# Patient Record
Sex: Female | Born: 1935
Health system: Southern US, Community
[De-identification: ages and names within clinical notes are randomized; demographics above are authoritative.]

## PROBLEM LIST (undated history)

## (undated) DIAGNOSIS — E785 Hyperlipidemia, unspecified: Secondary | ICD-10-CM

## (undated) DIAGNOSIS — M810 Age-related osteoporosis without current pathological fracture: Secondary | ICD-10-CM

## (undated) DIAGNOSIS — S42202A Unspecified fracture of upper end of left humerus, initial encounter for closed fracture: Secondary | ICD-10-CM

## (undated) DIAGNOSIS — M79609 Pain in unspecified limb: Secondary | ICD-10-CM

## (undated) DIAGNOSIS — R32 Unspecified urinary incontinence: Secondary | ICD-10-CM

## (undated) DIAGNOSIS — R209 Unspecified disturbances of skin sensation: Secondary | ICD-10-CM

## (undated) DIAGNOSIS — L2089 Other atopic dermatitis: Secondary | ICD-10-CM

## (undated) DIAGNOSIS — R55 Syncope and collapse: Secondary | ICD-10-CM

## (undated) DIAGNOSIS — H8090 Unspecified otosclerosis, unspecified ear: Secondary | ICD-10-CM

## (undated) DIAGNOSIS — G5600 Carpal tunnel syndrome, unspecified upper limb: Secondary | ICD-10-CM

## (undated) DIAGNOSIS — H905 Unspecified sensorineural hearing loss: Secondary | ICD-10-CM

## (undated) DIAGNOSIS — E039 Hypothyroidism, unspecified: Secondary | ICD-10-CM

## (undated) DIAGNOSIS — M1990 Unspecified osteoarthritis, unspecified site: Secondary | ICD-10-CM

## (undated) DIAGNOSIS — J339 Nasal polyp, unspecified: Secondary | ICD-10-CM

## (undated) DIAGNOSIS — R7989 Other specified abnormal findings of blood chemistry: Secondary | ICD-10-CM

## (undated) DIAGNOSIS — N318 Other neuromuscular dysfunction of bladder: Secondary | ICD-10-CM

## (undated) DIAGNOSIS — K5730 Diverticulosis of large intestine without perforation or abscess without bleeding: Secondary | ICD-10-CM

## (undated) HISTORY — DX: Syncope and collapse: R55

## (undated) HISTORY — DX: Diverticulosis of large intestine without perforation or abscess without bleeding: K57.30

## (undated) HISTORY — DX: Carpal tunnel syndrome, unspecified upper limb: G56.00

## (undated) HISTORY — DX: Unspecified otosclerosis, unspecified ear: H80.90

## (undated) HISTORY — DX: Unspecified urinary incontinence: R32

## (undated) HISTORY — DX: Unspecified sensorineural hearing loss: H90.5

## (undated) HISTORY — DX: Unspecified disturbances of skin sensation: R20.9

## (undated) HISTORY — PX: CATARACT EXTRACTION W/ INTRAOCULAR LENS  IMPLANT, BILATERAL: SHX1307

## (undated) HISTORY — DX: Age-related osteoporosis without current pathological fracture: M81.0

## (undated) HISTORY — DX: Other neuromuscular dysfunction of bladder: N31.8

## (undated) HISTORY — DX: Unspecified osteoarthritis, unspecified site: M19.90

## (undated) HISTORY — DX: Pain in unspecified limb: M79.609

## (undated) HISTORY — DX: Unspecified fracture of upper end of left humerus, initial encounter for closed fracture: S42.202A

## (undated) HISTORY — DX: Other specified abnormal findings of blood chemistry: R79.89

## (undated) HISTORY — DX: Hyperlipidemia, unspecified: E78.5

## (undated) HISTORY — DX: Nasal polyp, unspecified: J33.9

## (undated) HISTORY — DX: Other atopic dermatitis: L20.89

---

## 1990-07-16 HISTORY — PX: BREAST BIOPSY: SHX20

## 1997-02-04 HISTORY — PX: ABDOMINAL HYSTERECTOMY: SHX81

## 1997-10-24 ENCOUNTER — Ambulatory Visit (HOSPITAL_COMMUNITY): Admission: RE | Admit: 1997-10-24 | Discharge: 1997-10-24 | Payer: Self-pay | Admitting: Family Medicine

## 1998-10-26 ENCOUNTER — Ambulatory Visit (HOSPITAL_COMMUNITY): Admission: RE | Admit: 1998-10-26 | Discharge: 1998-10-26 | Payer: Self-pay | Admitting: Family Medicine

## 1998-10-26 ENCOUNTER — Encounter: Payer: Self-pay | Admitting: Family Medicine

## 1999-10-30 ENCOUNTER — Ambulatory Visit (HOSPITAL_COMMUNITY): Admission: RE | Admit: 1999-10-30 | Discharge: 1999-10-30 | Payer: Self-pay | Admitting: Family Medicine

## 1999-10-30 ENCOUNTER — Encounter: Payer: Self-pay | Admitting: Family Medicine

## 1999-12-31 ENCOUNTER — Ambulatory Visit (HOSPITAL_COMMUNITY): Admission: RE | Admit: 1999-12-31 | Discharge: 1999-12-31 | Payer: Self-pay | Admitting: Family Medicine

## 1999-12-31 ENCOUNTER — Encounter: Payer: Self-pay | Admitting: Family Medicine

## 2000-10-30 ENCOUNTER — Encounter: Payer: Self-pay | Admitting: Family Medicine

## 2000-10-30 ENCOUNTER — Ambulatory Visit (HOSPITAL_COMMUNITY): Admission: RE | Admit: 2000-10-30 | Discharge: 2000-10-30 | Payer: Self-pay | Admitting: Family Medicine

## 2000-11-14 ENCOUNTER — Other Ambulatory Visit: Admission: RE | Admit: 2000-11-14 | Discharge: 2000-11-14 | Payer: Self-pay | Admitting: Family Medicine

## 2001-11-02 ENCOUNTER — Ambulatory Visit (HOSPITAL_COMMUNITY): Admission: RE | Admit: 2001-11-02 | Discharge: 2001-11-02 | Payer: Self-pay | Admitting: Family Medicine

## 2001-11-02 ENCOUNTER — Encounter: Payer: Self-pay | Admitting: Family Medicine

## 2001-11-17 ENCOUNTER — Other Ambulatory Visit: Admission: RE | Admit: 2001-11-17 | Discharge: 2001-11-17 | Payer: Self-pay | Admitting: Family Medicine

## 2002-03-23 ENCOUNTER — Encounter: Payer: Self-pay | Admitting: Family Medicine

## 2002-03-23 ENCOUNTER — Encounter: Admission: RE | Admit: 2002-03-23 | Discharge: 2002-03-23 | Payer: Self-pay | Admitting: Family Medicine

## 2002-11-09 ENCOUNTER — Encounter: Payer: Self-pay | Admitting: Family Medicine

## 2002-11-09 ENCOUNTER — Ambulatory Visit (HOSPITAL_COMMUNITY): Admission: RE | Admit: 2002-11-09 | Discharge: 2002-11-09 | Payer: Self-pay | Admitting: Family Medicine

## 2002-11-22 ENCOUNTER — Other Ambulatory Visit: Admission: RE | Admit: 2002-11-22 | Discharge: 2002-11-22 | Payer: Self-pay | Admitting: Family Medicine

## 2003-02-22 DIAGNOSIS — H8090 Unspecified otosclerosis, unspecified ear: Secondary | ICD-10-CM

## 2003-02-22 HISTORY — DX: Unspecified otosclerosis, unspecified ear: H80.90

## 2003-11-11 ENCOUNTER — Ambulatory Visit (HOSPITAL_COMMUNITY): Admission: RE | Admit: 2003-11-11 | Discharge: 2003-11-11 | Payer: Self-pay | Admitting: Family Medicine

## 2004-11-14 ENCOUNTER — Ambulatory Visit (HOSPITAL_COMMUNITY): Admission: RE | Admit: 2004-11-14 | Discharge: 2004-11-14 | Payer: Self-pay | Admitting: Family Medicine

## 2005-11-15 ENCOUNTER — Ambulatory Visit (HOSPITAL_COMMUNITY): Admission: RE | Admit: 2005-11-15 | Discharge: 2005-11-15 | Payer: Self-pay | Admitting: Family Medicine

## 2005-12-20 ENCOUNTER — Encounter: Admission: RE | Admit: 2005-12-20 | Discharge: 2005-12-20 | Payer: Self-pay | Admitting: Family Medicine

## 2006-09-11 ENCOUNTER — Encounter: Admission: RE | Admit: 2006-09-11 | Discharge: 2006-09-11 | Payer: Self-pay | Admitting: Family Medicine

## 2006-12-17 ENCOUNTER — Ambulatory Visit (HOSPITAL_COMMUNITY): Admission: RE | Admit: 2006-12-17 | Discharge: 2006-12-17 | Payer: Self-pay | Admitting: Family Medicine

## 2007-12-21 ENCOUNTER — Ambulatory Visit (HOSPITAL_COMMUNITY): Admission: RE | Admit: 2007-12-21 | Discharge: 2007-12-21 | Payer: Self-pay | Admitting: Family Medicine

## 2008-01-08 ENCOUNTER — Encounter: Admission: RE | Admit: 2008-01-08 | Discharge: 2008-01-08 | Payer: Self-pay | Admitting: Family Medicine

## 2008-12-21 ENCOUNTER — Ambulatory Visit (HOSPITAL_COMMUNITY): Admission: RE | Admit: 2008-12-21 | Discharge: 2008-12-21 | Payer: Self-pay | Admitting: Family Medicine

## 2009-02-16 DIAGNOSIS — K5730 Diverticulosis of large intestine without perforation or abscess without bleeding: Secondary | ICD-10-CM

## 2009-02-16 DIAGNOSIS — N3281 Overactive bladder: Secondary | ICD-10-CM | POA: Insufficient documentation

## 2009-02-16 DIAGNOSIS — M1990 Unspecified osteoarthritis, unspecified site: Secondary | ICD-10-CM

## 2009-02-16 DIAGNOSIS — L2089 Other atopic dermatitis: Secondary | ICD-10-CM

## 2009-02-16 DIAGNOSIS — N318 Other neuromuscular dysfunction of bladder: Secondary | ICD-10-CM

## 2009-02-16 HISTORY — DX: Other atopic dermatitis: L20.89

## 2009-02-16 HISTORY — DX: Diverticulosis of large intestine without perforation or abscess without bleeding: K57.30

## 2009-02-16 HISTORY — DX: Unspecified osteoarthritis, unspecified site: M19.90

## 2009-02-16 HISTORY — DX: Other neuromuscular dysfunction of bladder: N31.8

## 2009-02-21 DIAGNOSIS — R209 Unspecified disturbances of skin sensation: Secondary | ICD-10-CM

## 2009-02-21 DIAGNOSIS — R32 Unspecified urinary incontinence: Secondary | ICD-10-CM

## 2009-02-21 DIAGNOSIS — H905 Unspecified sensorineural hearing loss: Secondary | ICD-10-CM

## 2009-02-21 DIAGNOSIS — J339 Nasal polyp, unspecified: Secondary | ICD-10-CM

## 2009-02-21 HISTORY — DX: Nasal polyp, unspecified: J33.9

## 2009-02-21 HISTORY — DX: Unspecified urinary incontinence: R32

## 2009-02-21 HISTORY — DX: Unspecified disturbances of skin sensation: R20.9

## 2009-02-21 HISTORY — DX: Unspecified sensorineural hearing loss: H90.5

## 2009-03-07 DIAGNOSIS — G5600 Carpal tunnel syndrome, unspecified upper limb: Secondary | ICD-10-CM

## 2009-03-07 HISTORY — DX: Carpal tunnel syndrome, unspecified upper limb: G56.00

## 2009-04-05 ENCOUNTER — Encounter: Admission: RE | Admit: 2009-04-05 | Discharge: 2009-04-05 | Payer: Self-pay | Admitting: Orthopedic Surgery

## 2009-04-07 ENCOUNTER — Ambulatory Visit (HOSPITAL_BASED_OUTPATIENT_CLINIC_OR_DEPARTMENT_OTHER): Admission: RE | Admit: 2009-04-07 | Discharge: 2009-04-07 | Payer: Self-pay | Admitting: Orthopedic Surgery

## 2009-04-07 HISTORY — PX: CARPAL TUNNEL RELEASE: SHX101

## 2009-05-30 DIAGNOSIS — R55 Syncope and collapse: Secondary | ICD-10-CM

## 2009-05-30 HISTORY — DX: Syncope and collapse: R55

## 2009-10-24 DIAGNOSIS — E785 Hyperlipidemia, unspecified: Secondary | ICD-10-CM

## 2009-10-24 HISTORY — DX: Hyperlipidemia, unspecified: E78.5

## 2009-12-05 DIAGNOSIS — M79609 Pain in unspecified limb: Secondary | ICD-10-CM

## 2009-12-05 HISTORY — DX: Pain in unspecified limb: M79.609

## 2009-12-06 ENCOUNTER — Encounter: Admission: RE | Admit: 2009-12-06 | Discharge: 2009-12-06 | Payer: Self-pay | Admitting: Internal Medicine

## 2009-12-25 ENCOUNTER — Ambulatory Visit (HOSPITAL_COMMUNITY)
Admission: RE | Admit: 2009-12-25 | Discharge: 2009-12-25 | Payer: Self-pay | Source: Home / Self Care | Admitting: Internal Medicine

## 2010-02-25 ENCOUNTER — Encounter: Payer: Self-pay | Admitting: Family Medicine

## 2010-04-30 LAB — POCT HEMOGLOBIN-HEMACUE: Hemoglobin: 13.9 g/dL (ref 12.0–15.0)

## 2010-06-16 DIAGNOSIS — M810 Age-related osteoporosis without current pathological fracture: Secondary | ICD-10-CM

## 2010-06-16 HISTORY — DX: Age-related osteoporosis without current pathological fracture: M81.0

## 2010-06-19 DIAGNOSIS — R739 Hyperglycemia, unspecified: Secondary | ICD-10-CM | POA: Insufficient documentation

## 2010-06-19 DIAGNOSIS — R7989 Other specified abnormal findings of blood chemistry: Secondary | ICD-10-CM

## 2010-06-19 HISTORY — DX: Other specified abnormal findings of blood chemistry: R79.89

## 2010-11-26 ENCOUNTER — Other Ambulatory Visit: Payer: Self-pay | Admitting: Internal Medicine

## 2010-11-26 DIAGNOSIS — Z1231 Encounter for screening mammogram for malignant neoplasm of breast: Secondary | ICD-10-CM

## 2011-01-02 ENCOUNTER — Ambulatory Visit (HOSPITAL_COMMUNITY)
Admission: RE | Admit: 2011-01-02 | Discharge: 2011-01-02 | Disposition: A | Discharge status: Home / Self Care | Payer: Medicare Other | Source: Ambulatory Visit | Attending: Internal Medicine | Admitting: Internal Medicine

## 2011-01-02 DIAGNOSIS — Z1231 Encounter for screening mammogram for malignant neoplasm of breast: Secondary | ICD-10-CM

## 2011-04-25 ENCOUNTER — Other Ambulatory Visit: Payer: Self-pay

## 2011-04-25 ENCOUNTER — Encounter (HOSPITAL_COMMUNITY): Payer: Self-pay | Admitting: Emergency Medicine

## 2011-04-25 ENCOUNTER — Emergency Department (HOSPITAL_COMMUNITY): Payer: Medicare Other

## 2011-04-25 ENCOUNTER — Emergency Department (HOSPITAL_COMMUNITY)
Admission: EM | Admit: 2011-04-25 | Discharge: 2011-04-25 | Disposition: A | Discharge status: Home / Self Care | Payer: Medicare Other | Attending: Emergency Medicine | Admitting: Emergency Medicine

## 2011-04-25 DIAGNOSIS — M25529 Pain in unspecified elbow: Secondary | ICD-10-CM | POA: Insufficient documentation

## 2011-04-25 DIAGNOSIS — S0993XA Unspecified injury of face, initial encounter: Secondary | ICD-10-CM | POA: Diagnosis not present

## 2011-04-25 DIAGNOSIS — E039 Hypothyroidism, unspecified: Secondary | ICD-10-CM | POA: Insufficient documentation

## 2011-04-25 DIAGNOSIS — W19XXXA Unspecified fall, initial encounter: Secondary | ICD-10-CM | POA: Insufficient documentation

## 2011-04-25 DIAGNOSIS — M47812 Spondylosis without myelopathy or radiculopathy, cervical region: Secondary | ICD-10-CM | POA: Diagnosis not present

## 2011-04-25 DIAGNOSIS — IMO0002 Reserved for concepts with insufficient information to code with codable children: Secondary | ICD-10-CM | POA: Diagnosis not present

## 2011-04-25 DIAGNOSIS — T148XXA Other injury of unspecified body region, initial encounter: Secondary | ICD-10-CM | POA: Diagnosis not present

## 2011-04-25 DIAGNOSIS — S064X9A Epidural hemorrhage with loss of consciousness of unspecified duration, initial encounter: Secondary | ICD-10-CM | POA: Diagnosis not present

## 2011-04-25 DIAGNOSIS — T71191A Asphyxiation due to mechanical threat to breathing due to other causes, accidental, initial encounter: Secondary | ICD-10-CM | POA: Diagnosis not present

## 2011-04-25 DIAGNOSIS — S199XXA Unspecified injury of neck, initial encounter: Secondary | ICD-10-CM | POA: Diagnosis not present

## 2011-04-25 DIAGNOSIS — J45909 Unspecified asthma, uncomplicated: Secondary | ICD-10-CM | POA: Insufficient documentation

## 2011-04-25 DIAGNOSIS — R55 Syncope and collapse: Secondary | ICD-10-CM | POA: Insufficient documentation

## 2011-04-25 DIAGNOSIS — S50311A Abrasion of right elbow, initial encounter: Secondary | ICD-10-CM

## 2011-04-25 DIAGNOSIS — Y9229 Other specified public building as the place of occurrence of the external cause: Secondary | ICD-10-CM | POA: Insufficient documentation

## 2011-04-25 HISTORY — DX: Hypothyroidism, unspecified: E03.9

## 2011-04-25 LAB — DIFFERENTIAL
Basophils Absolute: 0.1 10*3/uL (ref 0.0–0.1)
Basophils Relative: 1 % (ref 0–1)
Eosinophils Absolute: 0.5 10*3/uL (ref 0.0–0.7)
Eosinophils Relative: 6 % — ABNORMAL HIGH (ref 0–5)
Lymphocytes Relative: 22 % (ref 12–46)
Lymphs Abs: 2 10*3/uL (ref 0.7–4.0)
Monocytes Relative: 7 % (ref 3–12)
Neutro Abs: 5.7 10*3/uL (ref 1.7–7.7)
Neutrophils Relative %: 65 % (ref 43–77)

## 2011-04-25 LAB — URINALYSIS, ROUTINE W REFLEX MICROSCOPIC
Bilirubin Urine: NEGATIVE
Glucose, UA: NEGATIVE mg/dL
Hgb urine dipstick: NEGATIVE
Ketones, ur: NEGATIVE mg/dL
Nitrite: NEGATIVE
Protein, ur: NEGATIVE mg/dL
Specific Gravity, Urine: 1.012 (ref 1.005–1.030)
Urobilinogen, UA: 0.2 mg/dL (ref 0.0–1.0)
pH: 7 (ref 5.0–8.0)

## 2011-04-25 LAB — BASIC METABOLIC PANEL
Calcium: 9.6 mg/dL (ref 8.4–10.5)
GFR calc Af Amer: 84 mL/min — ABNORMAL LOW (ref >90)
GFR calc non Af Amer: 73 mL/min — ABNORMAL LOW (ref >90)
Potassium: 4.2 mEq/L (ref 3.5–5.1)
Sodium: 137 mEq/L (ref 135–145)

## 2011-04-25 LAB — CBC
HCT: 42.4 % (ref 36.0–46.0)
Hemoglobin: 14.1 g/dL (ref 12.0–15.0)
MCH: 30.4 pg (ref 26.0–34.0)
MCHC: 33.3 g/dL (ref 30.0–36.0)
MCV: 91.4 fL (ref 78.0–100.0)
Platelets: 237 10*3/uL (ref 150–400)
RBC: 4.64 MIL/uL (ref 3.87–5.11)
RDW: 13.7 % (ref 11.5–15.5)

## 2011-04-25 LAB — URINE MICROSCOPIC-ADD ON

## 2011-04-25 LAB — TROPONIN I: Troponin I: <0.3 ng/mL (ref <0.30)

## 2011-04-25 MED ORDER — SODIUM CHLORIDE 0.9 % IV BOLUS (SEPSIS)
1000.0000 mL | Freq: Once | INTRAVENOUS | Status: AC
  Administered 2011-04-25: 1000 mL via INTRAVENOUS

## 2011-04-25 MED ORDER — BACITRACIN-NEOMYCIN-POLYMYXIN 400-5-5000 EX OINT
TOPICAL_OINTMENT | Freq: Every day | CUTANEOUS | Status: DC
  Administered 2011-04-25: 17:00:00 via TOPICAL
  Filled 2011-04-25: qty 1

## 2011-04-25 NOTE — ED Provider Notes (Signed)
History     CSN: [id number]  Arrival date & time 04/25/11  1200   First MD Initiated Contact with Patient 04/25/11 1245      No chief complaint on file.   (Consider location/radiation/quality/duration/timing/severity/associated sxs/prior treatment) The history is provided by the patient. No language interpreter was used.    78 year old female presents ED with chief complaint of fall. Patient states she was standing and reading greeting cards, felt lightheadedness and the next thing she knows she was on the floor.  She immediately regain conscious.  Sts she may have falling on her back and hits her head.  She denies significant headache, neck pain, cp, sob, abd pain, back pain, leg pain, n/v, dysurea.  She denies numbness or weakness.  Denies being on blood thinner.  Only complaint is R elbow pain.  Sts she has hx of fall in the past, at least 4 times for the past 2 years.  However, all falls for various reasons.  She has been eating and drinking as usual.  Denies any recent medication changes.  Pt living in an assistant living.  Usually does not require any assistant for ambulation.    Past Medical History  Diagnosis Date  . Asthma   . Hypothyroid     No past surgical history on file.  No family history on file.  History  Substance Use Topics  . Smoking status: Not on file  . Smokeless tobacco: Not on file  . Alcohol Use:     OB History    Grav Para Term Preterm Abortions TAB SAB Ect Mult Living                  Review of Systems  All other systems reviewed and are negative.    Allergies  Review of patient's allergies indicates not on file.  Home Medications  No current outpatient prescriptions on file.  BP 152/62  Pulse 76  Temp(Src) 97.9 F (36.6 C) (Oral)  Resp 20  SpO2 100%  Physical Exam  Nursing note and vitals reviewed. Constitutional: She appears well-developed and well-nourished. No distress.       Awake, alert, nontoxic appearance  HENT:    Head: Normocephalic.  Right Ear: External ear normal.  Left Ear: External ear normal.  Mouth/Throat: Oropharynx is clear and moist. No oropharyngeal exudate.       Mild tenderness to vertex of head without obvious deformity or hematoma noted.  No midface tenderness, no hemotympanum, no dental avulsion.    Eyes: Conjunctivae are normal. Right eye exhibits no discharge. Left eye exhibits no discharge.  Neck: Normal range of motion. Neck supple.  Cardiovascular: Normal rate and regular rhythm.   Pulmonary/Chest: Effort normal. No respiratory distress. She exhibits no tenderness.  Abdominal: Soft. There is no tenderness. There is no rebound.  Musculoskeletal: She exhibits no tenderness.       Right shoulder: Normal.       Left shoulder: Normal.       Right elbow: She exhibits laceration. She exhibits normal range of motion, no effusion and no deformity. tenderness found.       Left elbow: Normal.       Right wrist: Normal.       Left wrist: Normal.       Right hip: Normal.       Left hip: Normal.       Right knee: Normal.       Left knee: Normal.       Cervical back:  Normal.       Thoracic back: Normal.       Lumbar back: Normal.       ROM appears intact, no obvious focal weakness  Neurological:       Mental status and motor strength appears intact  Skin: No rash noted.  Psychiatric: She has a normal mood and affect.    ED Course  Procedures (including critical care time)   Labs Reviewed  CBC  DIFFERENTIAL  URINALYSIS, ROUTINE W REFLEX MICROSCOPIC   No results found.   No diagnosis found.  Results for orders placed during the hospital encounter of 04/25/11  CBC      Component Value Range   WBC 8.9  4.0 - 10.5 (K/uL)   RBC 4.64  3.87 - 5.11 (MIL/uL)   Hemoglobin 14.1  12.0 - 15.0 (g/dL)   HCT 13.0  86.5 - 78.4 (%)   MCV 91.4  78.0 - 100.0 (fL)   MCH 30.4  26.0 - 34.0 (pg)   MCHC 33.3  30.0 - 36.0 (g/dL)   RDW 69.6  29.5 - 28.4 (%)   Platelets 237  150 - 400 (K/uL)   DIFFERENTIAL      Component Value Range   Neutrophils Relative 65  43 - 77 (%)   Neutro Abs 5.7  1.7 - 7.7 (K/uL)   Lymphocytes Relative 22  12 - 46 (%)   Lymphs Abs 2.0  0.7 - 4.0 (K/uL)   Monocytes Relative 7  3 - 12 (%)   Monocytes Absolute 0.7  0.1 - 1.0 (K/uL)   Eosinophils Relative 6 (*) 0 - 5 (%)   Eosinophils Absolute 0.5  0.0 - 0.7 (K/uL)   Basophils Relative 1  0 - 1 (%)   Basophils Absolute 0.1  0.0 - 0.1 (K/uL)  BASIC METABOLIC PANEL      Component Value Range   Sodium 137  135 - 145 (mEq/L)   Potassium 4.2  3.5 - 5.1 (mEq/L)   Chloride 104  96 - 112 (mEq/L)   CO2 23  19 - 32 (mEq/L)   Glucose, Bld 96  70 - 99 (mg/dL)   BUN 19  6 - 23 (mg/dL)   Creatinine, Ser 1.32  0.50 - 1.10 (mg/dL)   Calcium 9.6  8.4 - 44.0 (mg/dL)   GFR calc non Af Amer 73 (*) >90 (mL/min)   GFR calc Af Amer 84 (*) >90 (mL/min)  TROPONIN I      Component Value Range   Troponin I <0.30  <0.30 (ng/mL)   Dg Elbow Complete Right  04/25/2011  *RADIOLOGY REPORT*  Clinical Data: Fall, pain, posterior abrasion  RIGHT ELBOW - COMPLETE 3+ VIEW  Comparison: None  Findings: Osseous demineralization. Joint spaces preserved. No acute fracture, dislocation or bone destruction. No elbow joint effusion seen.  IMPRESSION: Osseous demineralization. No acute abnormalities.  Original Report Authenticated By: Lollie Marrow, M.D.   Ct Head Wo Contrast  04/25/2011  *RADIOLOGY REPORT*  Clinical Data:  Syncopal episode with fall and head injury.  CT HEAD WITHOUT CONTRAST CT CERVICAL SPINE WITHOUT CONTRAST  Technique:  Multidetector CT imaging of the head and cervical spine was performed following the standard protocol without intravenous contrast.  Multiplanar CT image reconstructions of the cervical spine were also generated.  Comparison:  Sinus CT 09/11/2006.  Chest radiographs 04/05/2009.  CT HEAD  Findings: There is no evidence of acute intracranial hemorrhage, mass lesion, brain edema or extra-axial fluid collection.   The ventricles and subarachnoid  spaces are appropriately sized for age. There is no CT evidence of acute cortical infarction.  There is mild symmetric periventricular white matter disease.  There is diffuse paranasal sinus opacification which appears progressive compared with the prior sinus CT.  The frontal, ethmoid, sphenoid and right maxillary sinuses are nearly completely opacified.  No air-fluid levels are identified but there is high- density material within the right maxillary sinus.  The mastoids and middle ears are clear.  No facial fractures are evident. The calvarium is intact.  IMPRESSION:  1.  No acute intracranial or calvarial findings. 2.  Severe chronic paranasal sinus inflammatory changes, progressive from 2008.  CT CERVICAL SPINE  Findings: The cervical alignment is normal.  There is no evidence of acute fracture or traumatic subluxation.  There is relatively mild spondylosis for age with disc space loss and uncinate spurring, greatest at C5-C6.  No acute soft tissue findings are demonstrated.  There is a large right paratracheal "mass" at the thoracic inlet measuring 4.7 x 2.9 cm on image 70.  This results in tracheal deviation to the left. This mass is partially calcified and extends into the right superior mediastinum.  This may reflect asymmetric goiter and prior chest radiographs show tracheal deviation to the left.  IMPRESSION:  1.  No evidence of acute cervical spine fracture, traumatic subluxation or static signs of instability. 2.  Mild cervical spondylosis.  3.  Large right paratracheal mass, likely representing asymmetric goiter.  Comparison is limited.  It may be helpful to obtain a follow-up chest radiograph to directly compare with prior radiographs to ascertain the relative stability of the mass effect.  Original Report Authenticated By: Gerrianne Scale, M.D.   Ct Cervical Spine Wo Contrast  04/25/2011  *RADIOLOGY REPORT*  Clinical Data:  Syncopal episode with fall and head  injury.  CT HEAD WITHOUT CONTRAST CT CERVICAL SPINE WITHOUT CONTRAST  Technique:  Multidetector CT imaging of the head and cervical spine was performed following the standard protocol without intravenous contrast.  Multiplanar CT image reconstructions of the cervical spine were also generated.  Comparison:  Sinus CT 09/11/2006.  Chest radiographs 04/05/2009.  CT HEAD  Findings: There is no evidence of acute intracranial hemorrhage, mass lesion, brain edema or extra-axial fluid collection.  The ventricles and subarachnoid spaces are appropriately sized for age. There is no CT evidence of acute cortical infarction.  There is mild symmetric periventricular white matter disease.  There is diffuse paranasal sinus opacification which appears progressive compared with the prior sinus CT.  The frontal, ethmoid, sphenoid and right maxillary sinuses are nearly completely opacified.  No air-fluid levels are identified but there is high- density material within the right maxillary sinus.  The mastoids and middle ears are clear.  No facial fractures are evident. The calvarium is intact.  IMPRESSION:  1.  No acute intracranial or calvarial findings. 2.  Severe chronic paranasal sinus inflammatory changes, progressive from 2008.  CT CERVICAL SPINE  Findings: The cervical alignment is normal.  There is no evidence of acute fracture or traumatic subluxation.  There is relatively mild spondylosis for age with disc space loss and uncinate spurring, greatest at C5-C6.  No acute soft tissue findings are demonstrated.  There is a large right paratracheal "mass" at the thoracic inlet measuring 4.7 x 2.9 cm on image 70.  This results in tracheal deviation to the left. This mass is partially calcified and extends into the right superior mediastinum.  This may reflect asymmetric goiter and prior chest  radiographs show tracheal deviation to the left.  IMPRESSION:  1.  No evidence of acute cervical spine fracture, traumatic subluxation or  static signs of instability. 2.  Mild cervical spondylosis.  3.  Large right paratracheal mass, likely representing asymmetric goiter.  Comparison is limited.  It may be helpful to obtain a follow-up chest radiograph to directly compare with prior radiographs to ascertain the relative stability of the mass effect.  Original Report Authenticated By: Gerrianne Scale, M.D.     Date: 04/25/2011  Rate: 81  Rhythm: normal sinus rhythm  QRS Axis: left  Intervals: normal  ST/T Wave abnormalities: normal  Conduction Disutrbances:incomplete LBBB, LVH  Narrative Interpretation:   Old EKG Reviewed: none available   MDM  Syncope, then fall.  No significant midline tenderness.  No focal neuro deficits.  No obvious source for syncope.  Work up initiated.  Pt was board and collared.  She request c-collar to be removed.  No cervical midline tenderness.     3:26 PM Work up today was unremarkable.  No acute fx or dislocation on imaging.  There are evidence of goiter, which pt is aware and currently taking synthroid for it.  Pt able to ambulate without any difficulty, has no complaint at this time.  R elbow abrasion were irrigated and dressed appropriately.  ECG shows incomplete LBBB with no prior ECG for comparison, however pt denies cp or sob.  I discussed with my attending, who felt that pt is safe to be discharged.  Pt agrees to f/u with her PCP Dr. Murray Hodgkins     Fayrene Helper, PA-C 04/25/11 1533

## 2011-04-25 NOTE — Progress Notes (Signed)
T/c from Jodie Echevaria, SW at [facility] [phone]) alerting hospital that Pt is a resident there and that her family is located in [place].  Family information is listed below:  [place], son, HCPOA--[phone], Ruby, [place] Isaias Cowman, daughter-in-law.  Providence Crosby, LCSWA Clinical Social Work [phone]

## 2011-04-25 NOTE — ED Provider Notes (Signed)
Medical screening examination/treatment/procedure(s) were performed by non-physician practitioner and as supervising physician I was immediately available for consultation/collaboration.   Lyanne Co, MD 04/25/11 2216

## 2011-04-25 NOTE — Discharge Instructions (Signed)
Syncope Syncope (fainting) is a sudden, short loss of consciousness. People normally fall to the ground when they faint. Recovery is often fast. HOME CARE  Do not drive or use machines. Wait until your doctor says it is safe to do so.   If you have diabetes, check your blood sugar. If it is low (below 70), you need to drink or eat something sweet. If over 300, call your doctor.   If you have a blood pressure machine at home, take your blood pressure. If the top number is below 100 or above 170, call your doctor.   Lie down until you feel normal.   Drink extra fluids (water, juice, soup).  GET HELP RIGHT AWAY IF:   You pass out (faint) when sitting or lying down. Do not drive. Call your local emergency services (911 in [place] if no one is there to help you.   There is chest pain.   You feel sick to your stomach (nauseous) or keep throwing up (vomiting).   You have very bad belly (abdominal) pain.   You feel your heartbeat is fast or not normal.   You lose feeling in some part of the body.   You cannot move your arms or legs.   You cannot talk well and get confused.   You feel weak or cannot see well.   You get sweaty and feel lightheaded.  MAKE SURE YOU:   Understand these instructions.   Will watch your condition.   Will get help right away if you are not doing well or get worse.  Document Released: 07/10/2007 Document Revised: 01/10/2011 Document Reviewed: 07/10/2007 [facility] Patient Information 2012 [place], [place].  Abrasions Abrasions are skin scrapes. Their treatment depends on how large and deep the abrasion is. Abrasions do not extend through all layers of the skin. A cut or lesion through all skin layers is called a laceration. HOME CARE INSTRUCTIONS   If you were given a dressing, change it at least once a day or as instructed by your caregiver. If the bandage sticks, soak it off with a solution of water or hydrogen peroxide.   Twice a day, wash the area with  soap and water to remove all the cream/ointment. You may do this in a sink, under a tub faucet, or in a shower. Rinse off the soap and pat dry with a clean towel. Look for signs of infection (see below).   Reapply cream/ointment according to your caregiver's instruction. This will help prevent infection and keep the bandage from sticking. Telfa or gauze over the wound and under the dressing or wrap will also help keep the bandage from sticking.   If the bandage becomes wet, dirty, or develops a foul smell, change it as soon as possible.   Only take over-the-counter or prescription medicines for pain, discomfort, or fever as directed by your caregiver.  SEEK IMMEDIATE MEDICAL CARE IF:   Increasing pain in the wound.   Signs of infection develop: redness, swelling, surrounding area is tender to touch, or pus coming from the wound.   You have a fever.   Any foul smell coming from the wound or dressing.  Most skin wounds heal within ten days. Facial wounds heal faster. However, an infection may occur despite proper treatment. You should have the wound checked for signs of infection within 24 to 48 hours or sooner if problems arise. If you were not given a wound-check appointment, look closely at the wound yourself on the second day  for early signs of infection listed above. MAKE SURE YOU:   Understand these instructions.   Will watch your condition.   Will get help right away if you are not doing well or get worse.  Document Released: 10/31/2004 Document Revised: 01/10/2011 Document Reviewed: 12/25/2010 [facility] Patient Information 2012 [place], [place].

## 2011-04-25 NOTE — ED Notes (Signed)
Pt had syncopal episode while standing in line at [employer]. Pt did hit back of head on carpeted floor. Hematoma to back of head. [facility] ([facility]

## 2011-04-25 NOTE — ED Notes (Signed)
NWG:NFAOZ<HY> Expected date:<BR> Expected time:11:54 AM<BR> Means of arrival:<BR> Comments:<BR> M70 - 89yoF Syncope, then fall

## 2011-04-30 DIAGNOSIS — E785 Hyperlipidemia, unspecified: Secondary | ICD-10-CM | POA: Diagnosis not present

## 2011-04-30 DIAGNOSIS — E039 Hypothyroidism, unspecified: Secondary | ICD-10-CM | POA: Diagnosis not present

## 2011-04-30 DIAGNOSIS — R7989 Other specified abnormal findings of blood chemistry: Secondary | ICD-10-CM | POA: Diagnosis not present

## 2011-04-30 DIAGNOSIS — R55 Syncope and collapse: Secondary | ICD-10-CM | POA: Diagnosis not present

## 2011-05-06 ENCOUNTER — Other Ambulatory Visit (HOSPITAL_COMMUNITY): Payer: Self-pay | Admitting: Internal Medicine

## 2011-05-06 DIAGNOSIS — R55 Syncope and collapse: Secondary | ICD-10-CM

## 2011-05-07 DIAGNOSIS — R55 Syncope and collapse: Secondary | ICD-10-CM | POA: Diagnosis not present

## 2011-05-14 ENCOUNTER — Ambulatory Visit (HOSPITAL_COMMUNITY)
Admission: RE | Admit: 2011-05-14 | Discharge: 2011-05-14 | Disposition: A | Discharge status: Home / Self Care | Payer: Medicare Other | Source: Ambulatory Visit | Attending: Internal Medicine | Admitting: Internal Medicine

## 2011-05-14 DIAGNOSIS — R9401 Abnormal electroencephalogram [EEG]: Secondary | ICD-10-CM | POA: Diagnosis not present

## 2011-05-14 DIAGNOSIS — R55 Syncope and collapse: Secondary | ICD-10-CM | POA: Diagnosis not present

## 2011-05-14 DIAGNOSIS — R569 Unspecified convulsions: Secondary | ICD-10-CM | POA: Diagnosis not present

## 2011-05-15 NOTE — Procedures (Signed)
EEG NUMBER:  [id number]  This routine EEG was requested in this 78 year old woman with history of syncopal episodes since age 68.  There is no warning of these episodes and no convulsions are noted.  There are no listed anticonvulsant medications.  The EEG was done with the patient awake and drowsy.  During periods of maximal wakefulness, she had a medium amplitude 9-10 cycle per second posterior dominant rhythm that attenuated with eye opening and was symmetric.  Background activities were composed of low frequency beta activities that were symmetric, although were frontally dominant.  Also noted were intermittent arrhythmic delta activities that were seen most frequently over the left anterior to mid temporal region. However, there did appear to be independent right anterior to mid temporal intermittent delta range slowing as well.  Photic stimulation produced a driving response.  It was better seen over the right hemisphere.  A good example of this is the photic driving at 9 cycles per second.  The patient did become drowsy as characterized by an attenuation of the alpha rhythm.  There is also the appearance of bursts of generalized intermittent rhythmic delta activities.   CLINICAL INTERPRETATION:  This routine EEG done with the patient awake and drowsy is abnormal.  The left anterior to mid temporal delta range slowing that was seen less frequently in the anterior to mid temporal delta range area suggest underlying neuronal dysfunction in those regions.  No interictal epileptiform discharges, or electrographic seizures were seen.          ______________________________ Denton Meek, MD    [id number] D:  05/14/2011 23:21:12  T:  05/15/2011 02:54:50  Job #:  [id number]

## 2011-05-21 DIAGNOSIS — R55 Syncope and collapse: Secondary | ICD-10-CM | POA: Diagnosis not present

## 2011-06-17 DIAGNOSIS — R7989 Other specified abnormal findings of blood chemistry: Secondary | ICD-10-CM | POA: Diagnosis not present

## 2011-06-17 DIAGNOSIS — E785 Hyperlipidemia, unspecified: Secondary | ICD-10-CM | POA: Diagnosis not present

## 2011-06-17 DIAGNOSIS — E039 Hypothyroidism, unspecified: Secondary | ICD-10-CM | POA: Diagnosis not present

## 2011-06-25 DIAGNOSIS — R55 Syncope and collapse: Secondary | ICD-10-CM | POA: Diagnosis not present

## 2011-06-25 DIAGNOSIS — E785 Hyperlipidemia, unspecified: Secondary | ICD-10-CM | POA: Diagnosis not present

## 2011-06-25 DIAGNOSIS — R32 Unspecified urinary incontinence: Secondary | ICD-10-CM | POA: Diagnosis not present

## 2011-06-25 DIAGNOSIS — E039 Hypothyroidism, unspecified: Secondary | ICD-10-CM | POA: Diagnosis not present

## 2011-06-25 DIAGNOSIS — R05 Cough: Secondary | ICD-10-CM | POA: Diagnosis not present

## 2011-11-27 ENCOUNTER — Other Ambulatory Visit: Payer: Self-pay | Admitting: Internal Medicine

## 2011-11-27 DIAGNOSIS — Z23 Encounter for immunization: Secondary | ICD-10-CM | POA: Diagnosis not present

## 2011-11-27 DIAGNOSIS — Z1231 Encounter for screening mammogram for malignant neoplasm of breast: Secondary | ICD-10-CM

## 2011-12-16 DIAGNOSIS — R7989 Other specified abnormal findings of blood chemistry: Secondary | ICD-10-CM | POA: Diagnosis not present

## 2011-12-16 DIAGNOSIS — E039 Hypothyroidism, unspecified: Secondary | ICD-10-CM | POA: Diagnosis not present

## 2011-12-24 DIAGNOSIS — R32 Unspecified urinary incontinence: Secondary | ICD-10-CM | POA: Diagnosis not present

## 2011-12-24 DIAGNOSIS — R7989 Other specified abnormal findings of blood chemistry: Secondary | ICD-10-CM | POA: Diagnosis not present

## 2011-12-24 DIAGNOSIS — E785 Hyperlipidemia, unspecified: Secondary | ICD-10-CM | POA: Diagnosis not present

## 2011-12-24 DIAGNOSIS — R05 Cough: Secondary | ICD-10-CM | POA: Diagnosis not present

## 2011-12-24 DIAGNOSIS — E039 Hypothyroidism, unspecified: Secondary | ICD-10-CM | POA: Diagnosis not present

## 2012-01-03 ENCOUNTER — Ambulatory Visit (HOSPITAL_COMMUNITY)
Admission: RE | Admit: 2012-01-03 | Discharge: 2012-01-03 | Disposition: A | Discharge status: Home / Self Care | Payer: Medicare Other | Source: Ambulatory Visit | Attending: Internal Medicine | Admitting: Internal Medicine

## 2012-01-03 DIAGNOSIS — Z1231 Encounter for screening mammogram for malignant neoplasm of breast: Secondary | ICD-10-CM | POA: Diagnosis not present

## 2012-01-07 ENCOUNTER — Other Ambulatory Visit: Payer: Self-pay | Admitting: Internal Medicine

## 2012-01-07 DIAGNOSIS — R928 Other abnormal and inconclusive findings on diagnostic imaging of breast: Secondary | ICD-10-CM

## 2012-01-15 ENCOUNTER — Ambulatory Visit
Admission: RE | Admit: 2012-01-15 | Discharge: 2012-01-15 | Disposition: A | Discharge status: Home / Self Care | Payer: PRIVATE HEALTH INSURANCE | Source: Ambulatory Visit | Attending: Internal Medicine | Admitting: Internal Medicine

## 2012-01-15 DIAGNOSIS — R928 Other abnormal and inconclusive findings on diagnostic imaging of breast: Secondary | ICD-10-CM

## 2012-01-15 DIAGNOSIS — N63 Unspecified lump in unspecified breast: Secondary | ICD-10-CM | POA: Diagnosis not present

## 2012-01-20 DIAGNOSIS — D3130 Benign neoplasm of unspecified choroid: Secondary | ICD-10-CM | POA: Diagnosis not present

## 2012-01-20 DIAGNOSIS — Z961 Presence of intraocular lens: Secondary | ICD-10-CM | POA: Diagnosis not present

## 2012-01-20 DIAGNOSIS — H26499 Other secondary cataract, unspecified eye: Secondary | ICD-10-CM | POA: Diagnosis not present

## 2012-01-20 DIAGNOSIS — H524 Presbyopia: Secondary | ICD-10-CM | POA: Diagnosis not present

## 2012-01-20 DIAGNOSIS — H04129 Dry eye syndrome of unspecified lacrimal gland: Secondary | ICD-10-CM | POA: Diagnosis not present

## 2012-01-24 ENCOUNTER — Ambulatory Visit (INDEPENDENT_AMBULATORY_CARE_PROVIDER_SITE_OTHER): Payer: Medicare Other | Admitting: Emergency Medicine

## 2012-01-24 VITALS — BP 150/80 | HR 81 | Temp 97.9°F | Resp 17 | Ht 60.0 in | Wt 133.0 lb

## 2012-01-24 DIAGNOSIS — L608 Other nail disorders: Secondary | ICD-10-CM

## 2012-01-24 DIAGNOSIS — L602 Onychogryphosis: Secondary | ICD-10-CM

## 2012-01-24 NOTE — Progress Notes (Signed)
[facility] [address], [place] [address] [phone]  Date:  01/24/2012   Name:  Kimberly Greer   DOB:  1921-05-03   MRN:  [id number]  PCP:  Kimber Relic, MD    Chief Complaint: lt toe toe pain   History of Present Illness:  Kimberly Greer is a 78 y.o. very pleasant female patient who presents with the following:  Pain in left great toe.  No history of injury and is concerned it may be infected.  Denies drainage, fever or chills.  No history of previous foot pain.  There is no problem list on file for this patient.   Past Medical History  Diagnosis Date  . Asthma   . Hypothyroid     No past surgical history on file.  History  Substance Use Topics  . Smoking status: Never Smoker   . Smokeless tobacco: Never Used  . Alcohol Use: No    No family history on file.  Allergies  Allergen Reactions  . Hista-Tabs (Triprolidine-Pse) Other (See Comments)    Passed out  . Sulfa Antibiotics     Itching/rash (all over)    Medication list has been reviewed and updated.  Current Outpatient Prescriptions on File Prior to Visit  Medication Sig Dispense Refill  . acetaminophen (TYLENOL ARTHRITIS PAIN) 650 MG CR tablet Take 650 mg by mouth every 8 (eight) hours as needed. PAIN      . Calcium-Vitamin D-Vitamin K (VIACTIV) 500-500-40 MG-UNT-MCG CHEW Chew 1 tablet by mouth daily.      . diclofenac sodium (VOLTAREN) 1 % GEL Apply 1 application topically 4 (four) times daily. PAIN      . Fluticasone-Salmeterol (ADVAIR DISKUS) 100-50 MCG/DOSE AEPB Inhale 1 puff into the lungs every 12 (twelve) hours.      Marland Kitchen levothyroxine (SYNTHROID, LEVOTHROID) 50 MCG tablet Take 50 mcg by mouth daily.      . montelukast (SINGULAIR) 10 MG tablet Take 10 mg by mouth at bedtime.      . Multiple Vitamins-Minerals (MULTIVITAMIN WITH MINERALS) tablet Take 1 tablet by mouth daily.      Marland Kitchen omeprazole (PRILOSEC) 20 MG capsule Take 20 mg by mouth daily.      . raloxifene  (EVISTA) 60 MG tablet Take 60 mg by mouth daily.      . Fluticasone-Salmeterol (ADVAIR) 100-50 MCG/DOSE AEPB Inhale 1 puff into the lungs every 12 (twelve) hours.      . solifenacin (VESICARE) 5 MG tablet Take 5 mg by mouth daily.        Review of Systems:  As per HPI, otherwise negative.    Physical Examination: Filed Vitals:   01/24/12 1302  BP: 150/80  Pulse: 81  Temp: 97.9 F (36.6 C)  Resp: 17   Filed Vitals:   01/24/12 1302  Height: 5' (1.524 m)  Weight: 133 lb (60.328 kg)   Body mass index is 25.97 kg/(m^2). Ideal Body Weight: Weight in (lb) to have BMI = 25: 127.7    GEN: WDWN, NAD, Non-toxic, Alert & Oriented x 3 HEENT: Atraumatic, Normocephalic.  Ears and Nose: No external deformity. EXTR: No clubbing/cyanosis/edema NEURO: Normal gait.  PSYCH: Normally interactive. Conversant. Not depressed or anxious appearing.  Calm demeanor.  FOOT:  Left great toe red and no tenderness.  Moderate onychogyphosis  Assessment and Plan: Pain in toe No evidence for infection Cotton pad Podiatry follow up   Carmelina Dane, MD

## 2012-02-10 DIAGNOSIS — L03039 Cellulitis of unspecified toe: Secondary | ICD-10-CM | POA: Diagnosis not present

## 2012-02-10 DIAGNOSIS — L600 Ingrowing nail: Secondary | ICD-10-CM | POA: Diagnosis not present

## 2012-04-28 ENCOUNTER — Other Ambulatory Visit: Payer: Self-pay | Admitting: Geriatric Medicine

## 2012-04-28 MED ORDER — MIRABEGRON ER 25 MG PO TB24: 25.0000 mg | ORAL_TABLET | Freq: Every day | ORAL | Status: DC

## 2012-05-29 ENCOUNTER — Other Ambulatory Visit: Payer: Self-pay | Admitting: Internal Medicine

## 2012-05-30 ENCOUNTER — Other Ambulatory Visit: Payer: Self-pay | Admitting: Internal Medicine

## 2012-06-01 ENCOUNTER — Other Ambulatory Visit: Payer: Self-pay | Admitting: *Deleted

## 2012-06-01 MED ORDER — RALOXIFENE HCL 60 MG PO TABS: ORAL_TABLET | ORAL | Status: DC

## 2012-06-15 DIAGNOSIS — E039 Hypothyroidism, unspecified: Secondary | ICD-10-CM | POA: Diagnosis not present

## 2012-06-15 DIAGNOSIS — R269 Unspecified abnormalities of gait and mobility: Secondary | ICD-10-CM | POA: Diagnosis not present

## 2012-06-15 DIAGNOSIS — R7989 Other specified abnormal findings of blood chemistry: Secondary | ICD-10-CM | POA: Diagnosis not present

## 2012-06-15 DIAGNOSIS — E785 Hyperlipidemia, unspecified: Secondary | ICD-10-CM | POA: Diagnosis not present

## 2012-06-16 DIAGNOSIS — R269 Unspecified abnormalities of gait and mobility: Secondary | ICD-10-CM | POA: Diagnosis not present

## 2012-06-22 DIAGNOSIS — R269 Unspecified abnormalities of gait and mobility: Secondary | ICD-10-CM | POA: Diagnosis not present

## 2012-06-23 ENCOUNTER — Encounter: Payer: Self-pay | Admitting: Internal Medicine

## 2012-06-23 ENCOUNTER — Non-Acute Institutional Stay: Payer: Medicare Other | Admitting: Internal Medicine

## 2012-06-23 VITALS — BP 136/70 | HR 80 | Temp 96.5°F | Ht 60.0 in | Wt 131.0 lb

## 2012-06-23 DIAGNOSIS — R7989 Other specified abnormal findings of blood chemistry: Secondary | ICD-10-CM | POA: Diagnosis not present

## 2012-06-23 DIAGNOSIS — H905 Unspecified sensorineural hearing loss: Secondary | ICD-10-CM

## 2012-06-23 DIAGNOSIS — R32 Unspecified urinary incontinence: Secondary | ICD-10-CM

## 2012-06-23 DIAGNOSIS — N318 Other neuromuscular dysfunction of bladder: Secondary | ICD-10-CM

## 2012-06-23 DIAGNOSIS — E039 Hypothyroidism, unspecified: Secondary | ICD-10-CM | POA: Insufficient documentation

## 2012-06-23 DIAGNOSIS — R413 Other amnesia: Secondary | ICD-10-CM

## 2012-06-23 DIAGNOSIS — E785 Hyperlipidemia, unspecified: Secondary | ICD-10-CM

## 2012-06-23 DIAGNOSIS — M1990 Unspecified osteoarthritis, unspecified site: Secondary | ICD-10-CM

## 2012-06-23 NOTE — Progress Notes (Signed)
Passed clock drawing 

## 2012-06-23 NOTE — Patient Instructions (Signed)
Continue current medications. 

## 2012-06-23 NOTE — Progress Notes (Signed)
Date: 06/23/2012  MRN:  [id number] Name:  Kimberly Greer Sex:  female Age:  79 y.o. DOB:07-12-1921   [facility] #:         [id number]              Facility/Room;[facility] Level Of Care: Independent Provider: Murray Hodgkins, MD  Emergency Contacts: Contact Information   Name Relation Home Work Mobile   [place] Other [phone]    Floy, Angert [phone]      Code Status: living will  Allergies: Allergies  Allergen Reactions  . Avelox (Moxifloxacin Hcl In Nacl)   . Doxycycline   . Hista-Tabs (Triprolidine-Pse) Other (See Comments)    Passed out  . Septra (Sulfamethoxazole W-Trimethoprim)   . Sulfa Antibiotics     Itching/rash (all over)     Chief Complaint  Patient presents with  . Annual Exam  . Medical Managment of Chronic Issues    thyroid, cholesterol, hyperglycemia     HPI: Feels like memory is failing. Trouble with names and places. Slower processing. Exertion makes here breathing worse. Quickly gets back to normal once she sits down. Knee pains are doing OK. History of chronic cough. Sputum in small quantity and white to clear. Constant rhinorrhea. Fluticasone Nasal spray helped in the past  1. Hypothyroid : stable  2. Other abnormal blood chemistry : hyperglycemia is stable  3. Other and unspecified hyperlipidemia : stable  4. Sensorineural hearing loss, unspecified : wears aid. She is scheduled to have ears tested this week to see f hearing aid can be improved.  5. Unspecified urinary incontinence : better since on Myrbetriq  6. Hypertonicity of bladder : stable  7. Osteoarthrosis, unspecified whether generalized or localized, unspecified site : knee pains whe usin stairs.     Past Medical History  Diagnosis Date  . Asthma   . Hypothyroid   . Senile osteoporosis 06/16/2010  . Other abnormal blood chemistry 06/19/2010  . Pain in limb 12/05/2009  . Other and unspecified hyperlipidemia 10/24/2009  . Syncope and  collapse 05/30/2009  . Carpal tunnel syndrome 03/07/2009  . Sensorineural hearing loss, unspecified 02/21/2009  . Unspecified nasal polyp 02/21/2009  . Disturbance of skin sensation 02/21/2009  . Unspecified urinary incontinence 02/21/2009  . Diverticulosis of colon (without mention of hemorrhage) 02/16/2009  . 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 Other atopic dermatitis and related conditions 02/16/2009  . Osteoarthrosis, unspecified whether generalized or localized, unspecified site 02/16/2009  . Otosclerosis, unspecified 02/22/2003    Past Surgical History  Procedure Laterality Date  . Breast biopsy  07/16/1990    benign left breast needle biopsy  Dr. Jamey Ripa  . Abdominal hysterectomy  1999    Dr. Delorse Lek  . Cataract extraction w/ intraocular lens  implant, bilateral    . Carpal tunnel release  04/07/2009    right Dr. Teressa Senter     Procedures: 07/05/1992 Mammogram-new nodular density in left retroareolar portion of the breast. 07/07/1992 [place] -left breast -confirming cystic nature to the new small retroareolar nodule. 08/06/1994 Chest x-ray Moderate hyperinflation, otherwise normal chest 08/06/1994 NM Lung Perfusion Ventilation -marked ventilatory defects with air trapping and matching perfusion defects-scan most compatible with obstructive airways disease. However, in view of the matched abnormalities, the scan cannot exclude pulmonary emboli. 02/18/1996 Bone Density-Definite severe osteoporosis  10/24/1997 Mammogram negative 06/02/1998 Bone Density -improving osteoporosis values. 10/26/1998 Mammogram-negative 10/30/1999 Mammogram-negative 12/31/1999 Foot/ankle x-ray negative  10/30/2000 Mammogram negative 03/23/2002 Chest x-ray change is compatible with chronic bronchitis and minimal chronic obstructive  pulmonary disease, no active disease is seen in the chest. 03/23/2002 Paranasal sinuses changes of chronic and acute sinusitis involving the right frontal sinus and right maxillary sinus. No bony destruction is  seen. 11/09/2002 Mammogram negative  11/22/2002 Pap smear negative 12/03/2002 Carotid doppler -no evidence of significant ICA or ECA stenosis bilaterally 12/20/2005 Left knee x-ray degenerative change with loss of joint space medially 12/20/2005 Right knee x-ray significant loss of medial joint space 09/11/2006 CT sinus -paranasal sinus inflammatory disease throughout 12/21/2007 Mammogram negative  01/08/2008 Left knee x-ray progression of degenerative change involving the medial joint space with almost complete loss of joint space. No acute abnormality. 12/21/2008 Mammogram negative  02/22/2009-Nerve Conduction Studies:This is an abnormal study with electrophysiologic evidence of the following: A right median mononeuropathy across the wrist consistent with severe carpal tunnel. syndrome. 12/06/2009- X-ray Right tibia/fibula: No definite acute fracture. Slight irregularity of the cortex of the distal right fibular tip. Cannot exclude small fracture at the site. Consider ankle views if further assessment is warranted. 12/25/2009-Mammogram: Negative 04/25/2011 CT Head no evidence of acute cervical spine fracture traumatic subluxation or static signs of instability. Mild cervical spondylosis. Large right paratracheal mass, likely representing asymmetric goiter. Comparison is limited. It may be helpful to obtain a follow-up chest radiograph to directly compare with prior radiographs to ascertain the relative stability of the mass effect. 04/25/2011 CT Cervical Spine no acute intracranial or calvarial findings. Severe chronic pernasal sinus inflammatory changes, processive from 2008. No evidence of acute cervical spine fracture traumatic subluxation or static signs of instability. Mild cervical spondylosis. Large right paratracheal mass, likely representing asymmetric goiter. Comparison is limited. It may be helpful to obtain a follow-up chest radiograph to directly compare with prior radiographs to ascertain the  relative stability of the mass effect. 04/25/2011 Right Elbow x-ray osseous demineralization. No acute abnormalities.  05/07/2011 Holter rare brief pauses (under 2"), brief runs of nonsustained atrial tachycardia. Brief bradycardia with rate falling to 37. 05/14/2011 EEG the left anterior to mid temporal delta range slowing that was seen frequently in the less frequent. Right anterior to mid temporal delta range slowing suggest an underlying of a neuronal dysfunction in those regions. No interictal epileptiform discharges, or electrographic seizures were seen.   Consultants: Dr Tracey Harries GYN Dr. Elmer Picker Ophthalmology Dr. Jenne Pane ENT Dr. Gaynelle Arabian Urology    Current Outpatient Prescriptions  Medication Sig Dispense Refill  . acetaminophen (TYLENOL ARTHRITIS PAIN) 650 MG CR tablet Take 650 mg by mouth every 8 (eight) hours as needed. PAIN      . Calcium-Vitamin D-Vitamin K (VIACTIV) 500-500-40 MG-UNT-MCG CHEW Chew 1 tablet by mouth daily.      Marland Kitchen EVISTA 60 MG tablet TAKE 1 TABLET BY MOUTH DAILY  30 tablet  4  . fluticasone (FLONASE) 50 MCG/ACT nasal spray 1 spray. 1-2 puffs in each nostril daily to control congestion      . Fluticasone-Salmeterol (ADVAIR DISKUS) 100-50 MCG/DOSE AEPB Inhale 1 puff into the lungs every 12 (twelve) hours.      Marland Kitchen ibuprofen (ADVIL,MOTRIN) 200 MG tablet Take 200 mg by mouth. One tablet every 4 hours if needed for pain      . levothyroxine (SYNTHROID, LEVOTHROID) 50 MCG tablet Take 50 mcg by mouth daily.      . mirabegron ER (MYRBETRIQ) 25 MG TB24 Take 1 tablet (25 mg total) by mouth daily.  30 tablet  3  . montelukast (SINGULAIR) 10 MG tablet Take 10 mg by mouth at bedtime.      Marland Kitchen  Multiple Vitamins-Minerals (MULTIVITAMIN WITH MINERALS) tablet Take 1 tablet by mouth daily.      Marland Kitchen omeprazole (PRILOSEC) 20 MG capsule Take 20 mg by mouth daily.       No current facility-administered medications for this visit.    Immunization History  Administered Date(s)  Administered  . Influenza Whole 12/27/2011  . Pneumococcal Polysaccharide 02/05/2008  . Td 02/05/2008     Diet: Regular  History  Substance Use Topics  . Smoking status: Never Smoker   . Smokeless tobacco: Never Used  . Alcohol Use: No    Family History  Problem Relation Age of Onset  . Cancer Mother     stomach  . Cancer Father     bone  . Dementia Sister   . Cancer Brother     pancreataic  . Cancer Son     pancreatic    Review of systems Constitutional: positive for Generalized weakness Eyes: positive for contacts/glasses, negative for redness and visual disturbance Ears, nose, mouth, throat, and face: positive for hearing loss, negative for ear drainage, earaches and nasal congestion Respiratory: positive for cough, dyspnea on exertion and sputum, negative for hemoptysis, pleurisy/chest pain, stridor and wheezing Cardiovascular: negative for chest pressure/discomfort, irregular heart beat, palpitations and tachypnea Gastrointestinal: negative Genitourinary:Urge incontinence Integument/breast: negative Hematologic/lymphatic: negative Musculoskeletal:Stiff in multiple joints. Arthralgias. Unsteady gait. Neurological: Difficulty with balance. Remote history of blackouts. Unsteady gait. Broad-based gait. No recent fainting or loss of consciousness. Complains of some loss of memory. Behavioral/Psych: negative Endocrine: negative Allergic/Immunologic: negative   Vital signs: BP 136/70  Pulse 80  Temp(Src) 96.5 F (35.8 C) (Oral)  Ht 5' (1.524 m)  Wt 131 lb (59.421 kg)  BMI 25.58 kg/m2    General Appearance:    Alert, cooperative, no distress, appears stated age.parietal elderly   Head:    Normocephalic, without obvious abnormality, atraumatic  Eyes:    PERRL, conjunctiva/corneas clear, EOM's intact, fundi    benign, both eyes  Ears:    Normal TM's and external ear canals, both ears. Hearing aid in right external auditory canal.   Nose:   Nares normal, septum  midline, mucosa normal, no drainage    or sinus tenderness. Nasal polyps.   Throat:   Lips, mucosa, and tongue normal; teeth and gums normal  Neck:   Supple, symmetrical, trachea midline, no adenopathy;    thyroid:  no enlargement/tenderness/nodules; no carotid   bruit or JVD  Back:     Symmetric, no curvature, ROM normal, no CVA tenderness. Mild kyphosis.   Lungs:     Clear to auscultation bilaterally, respirations unlabored  Chest Wall:    No tenderness or deformity   Heart:    Regular rate and rhythm, S1 and S2 normal, no murmur, rub   or gallop  Breast Exam:    No tenderness, masses, or nipple abnormality  Abdomen:     Soft, non-tender, bowel sounds active all four quadrants,    no masses, no organomegaly  Genitalia:    atrophic female without lesion, discharge or tenderness. Mild cystocele. Absent cervix and uterus and ovaries   Rectal:    Normal tone, normal prostate, no masses or tenderness;   guaiac negative stool  Extremities:   Extremities normal, atraumatic, no cyanosis or edema. Crepitance in both knees   Pulses:   2+ and symmetric all extremities  Skin:   Skin color, texture, turgor normal, no rashes or lesions  Lymph nodes:   Cervical, supraclavicular, and axillary nodes normal  Neurologic:  CNII-XII intact, normal strength, vibratory sensation, and reflexes   Lab reports 05/20/2011  CMP: glucose 99, BUN 25, Creatinine 1.27 Lipid: Cholesterol 159, Triglycerides 147, HDL 26, LDL 104 A1C: 6.0 Microalbumin 1.12 11/18/2011 BMP; Sodium 139, Potassium 3.8, glucose 107, BUN 32, Creatinine 1.45 Lipid: cholesterol 108, triglyceride 173, HDL 12, LDL 61 02/06/2012 BMP: Sodium 142, Potassium 4.4, glucose 97, BUN 29, Creatinine 1.19 HgbA1c 6.2  06/15/12 CMP: nl   Lipids: TC 222, trig 143, HDL 47, LDL 146   TSH 0.537  Screening Score  MMS 27  PHQ2    PHQ9     Fall Risk    BIMS    Annual summary: Hospitalizations:   07/16/1990 [facility] Left breast needle biopsy Dr.  Mariah Milling 08/09/1994 [facility] Status asthmaticus  04/25/2011 [place] syncope none in the last year.  Infection History: none Functional assessment: independent in all ADL Areas of potential improvement: none Rehabilitation Potential: not pertinent Prognosis for survival: good  Plan: 1. Hypothyroid Stable on current supplement  2. Other abnormal blood chemistry Hyperglycemia is mild and unchanged  3. Other and unspecified hyperlipidemia Stable. She runs a slightly high LDL. HDL is also high. I do not believe she needs more medication at her age.  4. Sensorineural hearing loss, unspecified To have hearing checkup soon to see if there can be a modification to her hearing to improve her hearing  5. Unspecified urinary incontinence Unchanged  6. Hypertonicity of bladder Unchanged. Improved on Myrbetriq  7. Osteoarthrosis, unspecified whether generalized or localized, unspecified site Unchanged  8. Memory loss Subtle changes according to the patient. MMSE was 27/30. She passed the clock drawing test . We will repeat this test later this year.

## 2012-06-25 DIAGNOSIS — R269 Unspecified abnormalities of gait and mobility: Secondary | ICD-10-CM | POA: Diagnosis not present

## 2012-07-01 DIAGNOSIS — R269 Unspecified abnormalities of gait and mobility: Secondary | ICD-10-CM | POA: Diagnosis not present

## 2012-07-03 DIAGNOSIS — R269 Unspecified abnormalities of gait and mobility: Secondary | ICD-10-CM | POA: Diagnosis not present

## 2012-07-07 DIAGNOSIS — M2550 Pain in unspecified joint: Secondary | ICD-10-CM | POA: Diagnosis not present

## 2012-07-07 DIAGNOSIS — M6281 Muscle weakness (generalized): Secondary | ICD-10-CM | POA: Diagnosis not present

## 2012-07-10 DIAGNOSIS — M2550 Pain in unspecified joint: Secondary | ICD-10-CM | POA: Diagnosis not present

## 2012-07-10 DIAGNOSIS — M6281 Muscle weakness (generalized): Secondary | ICD-10-CM | POA: Diagnosis not present

## 2012-07-13 ENCOUNTER — Other Ambulatory Visit: Payer: Self-pay | Admitting: Internal Medicine

## 2012-07-13 DIAGNOSIS — M6281 Muscle weakness (generalized): Secondary | ICD-10-CM | POA: Diagnosis not present

## 2012-07-13 DIAGNOSIS — M2550 Pain in unspecified joint: Secondary | ICD-10-CM | POA: Diagnosis not present

## 2012-07-17 DIAGNOSIS — M2550 Pain in unspecified joint: Secondary | ICD-10-CM | POA: Diagnosis not present

## 2012-07-17 DIAGNOSIS — M6281 Muscle weakness (generalized): Secondary | ICD-10-CM | POA: Diagnosis not present

## 2012-07-20 DIAGNOSIS — M6281 Muscle weakness (generalized): Secondary | ICD-10-CM | POA: Diagnosis not present

## 2012-07-20 DIAGNOSIS — M2550 Pain in unspecified joint: Secondary | ICD-10-CM | POA: Diagnosis not present

## 2012-07-24 DIAGNOSIS — M6281 Muscle weakness (generalized): Secondary | ICD-10-CM | POA: Diagnosis not present

## 2012-07-24 DIAGNOSIS — M2550 Pain in unspecified joint: Secondary | ICD-10-CM | POA: Diagnosis not present

## 2012-07-28 DIAGNOSIS — M6281 Muscle weakness (generalized): Secondary | ICD-10-CM | POA: Diagnosis not present

## 2012-07-28 DIAGNOSIS — M2550 Pain in unspecified joint: Secondary | ICD-10-CM | POA: Diagnosis not present

## 2012-07-31 DIAGNOSIS — M2550 Pain in unspecified joint: Secondary | ICD-10-CM | POA: Diagnosis not present

## 2012-07-31 DIAGNOSIS — M6281 Muscle weakness (generalized): Secondary | ICD-10-CM | POA: Diagnosis not present

## 2012-08-03 DIAGNOSIS — M2550 Pain in unspecified joint: Secondary | ICD-10-CM | POA: Diagnosis not present

## 2012-08-03 DIAGNOSIS — M6281 Muscle weakness (generalized): Secondary | ICD-10-CM | POA: Diagnosis not present

## 2012-08-08 DIAGNOSIS — M2550 Pain in unspecified joint: Secondary | ICD-10-CM | POA: Diagnosis not present

## 2012-08-08 DIAGNOSIS — R269 Unspecified abnormalities of gait and mobility: Secondary | ICD-10-CM | POA: Diagnosis not present

## 2012-08-10 ENCOUNTER — Other Ambulatory Visit: Payer: Self-pay | Admitting: Internal Medicine

## 2012-08-12 DIAGNOSIS — R269 Unspecified abnormalities of gait and mobility: Secondary | ICD-10-CM | POA: Diagnosis not present

## 2012-08-12 DIAGNOSIS — M2550 Pain in unspecified joint: Secondary | ICD-10-CM | POA: Diagnosis not present

## 2012-08-14 DIAGNOSIS — M2550 Pain in unspecified joint: Secondary | ICD-10-CM | POA: Diagnosis not present

## 2012-08-14 DIAGNOSIS — R269 Unspecified abnormalities of gait and mobility: Secondary | ICD-10-CM | POA: Diagnosis not present

## 2012-08-17 DIAGNOSIS — R269 Unspecified abnormalities of gait and mobility: Secondary | ICD-10-CM | POA: Diagnosis not present

## 2012-08-17 DIAGNOSIS — M2550 Pain in unspecified joint: Secondary | ICD-10-CM | POA: Diagnosis not present

## 2012-08-25 ENCOUNTER — Other Ambulatory Visit: Payer: Self-pay | Admitting: Internal Medicine

## 2012-09-14 ENCOUNTER — Other Ambulatory Visit: Payer: Self-pay | Admitting: Internal Medicine

## 2012-09-16 ENCOUNTER — Other Ambulatory Visit: Payer: Self-pay | Admitting: Geriatric Medicine

## 2012-09-16 MED ORDER — MONTELUKAST SODIUM 10 MG PO TABS: 10.0000 mg | ORAL_TABLET | Freq: Every day | ORAL | Status: DC

## 2012-10-27 ENCOUNTER — Encounter: Payer: Self-pay | Admitting: Internal Medicine

## 2012-10-27 ENCOUNTER — Non-Acute Institutional Stay: Payer: Medicare Other | Admitting: Internal Medicine

## 2012-10-27 VITALS — BP 140/84 | HR 102 | Ht 61.0 in | Wt 132.0 lb

## 2012-10-27 DIAGNOSIS — R413 Other amnesia: Secondary | ICD-10-CM | POA: Diagnosis not present

## 2012-10-27 DIAGNOSIS — R7989 Other specified abnormal findings of blood chemistry: Secondary | ICD-10-CM

## 2012-10-27 DIAGNOSIS — E039 Hypothyroidism, unspecified: Secondary | ICD-10-CM

## 2012-10-27 NOTE — Progress Notes (Signed)
Passed clock drawing 

## 2012-10-27 NOTE — Patient Instructions (Signed)
Continue current medications. 

## 2012-10-27 NOTE — Progress Notes (Signed)
Subjective:    Patient ID: Kimberly Greer, female    DOB: 09/06/1921, 79 y.o.   MRN: [id number]  HPI Feet get cold at night and interfere with sleep  Dyspnea with walking to appointment. No orthopnea.   Nocturia x 2. Able to return to sleep.  Chronic rhinorrhea. She is not using the Flonase.  Memory is about the same. Some difficulty in name memory.  Current Outpatient Prescriptions on File Prior to Visit  Medication Sig Dispense Refill  . acetaminophen (TYLENOL ARTHRITIS PAIN) 650 MG CR tablet Take 650 mg by mouth every 8 (eight) hours as needed. Take 1 tablet as needed for arthritis pain.      . Calcium-Vitamin D-Vitamin K (VIACTIV) 500-500-40 MG-UNT-MCG CHEW Chew 1 tablet by mouth daily. Take 1 tablet daily for calcium.      Marland Kitchen EVISTA 60 MG tablet TAKE 1 TABLET BY MOUTH DAILY  30 tablet  4  . fluticasone (FLONASE) 50 MCG/ACT nasal spray SHAKE WELL BEFORE EACH USE AND INSTILL 1 OR 2 SPRAYS IN EACH NOSTRIL ONCE DAILY TO CONTROL CONGESTION  16 g  3  . Fluticasone-Salmeterol (ADVAIR DISKUS) 100-50 MCG/DOSE AEPB Inhale 1 puff into the lungs daily. Take 1 puff once daily      . ibuprofen (ADVIL,MOTRIN) 200 MG tablet Take 200 mg by mouth. One tablet every 4 hours if needed for pain      . levothyroxine (SYNTHROID, LEVOTHROID) 50 MCG tablet Take 50 mcg by mouth daily. Take 1 tablet daily as a thyroid supplement.      . montelukast (SINGULAIR) 10 MG tablet Take 1 tablet (10 mg total) by mouth daily.  30 tablet  5  . Multiple Vitamins-Minerals (MULTIVITAMIN WITH MINERALS) tablet Take 1 tablet by mouth daily. Take 1 tablet as a vitamin supplement.      Marland Kitchen MYRBETRIQ 25 MG TB24 TAKE 1 TABLET (25 MG TOTAL) BY MOUTH DAILY.  30 tablet  2  . omeprazole (PRILOSEC) 20 MG capsule Take 20 mg by mouth daily. Take 1 tablet daily for heartburn or stomach reflux.       No current facility-administered medications on file prior to visit.    Review of Systems  Constitutional: Positive for fatigue.  Negative for fever, activity change and unexpected weight change.  HENT: Positive for hearing loss, congestion, rhinorrhea and postnasal drip. Negative for nosebleeds.   Eyes: Positive for visual disturbance.  Respiratory: Positive for cough, shortness of breath and wheezing. Negative for choking, chest tightness and stridor.   Cardiovascular: Negative for chest pain, palpitations and leg swelling.  Endocrine: Negative for cold intolerance, heat intolerance, polydipsia, polyphagia and polyuria.  Genitourinary: Negative for dysuria, frequency, flank pain and difficulty urinating.  Musculoskeletal: Positive for gait problem. Negative for myalgias and joint swelling.  Skin: Negative.   Neurological: Negative for dizziness, tremors, seizures and headaches.  Hematological: Negative.   Psychiatric/Behavioral: Negative.        Objective:BP 140/84  Pulse 102  Ht 5\' 1"  (1.549 m)  Wt 132 lb (59.875 kg)  BMI 24.95 kg/m2  SpO2 89%    Physical Exam  Constitutional: She is oriented to person, place, and time. She appears well-developed and well-nourished. No distress.  HENT:  Head: Normocephalic and atraumatic.  Right Ear: External ear normal.  Left Ear: External ear normal.  Nose: Nose normal.  Mouth/Throat: Oropharynx is clear and moist.  Mild hearing deficit  Eyes:  Corrective lenses  Neck: No JVD present. No tracheal deviation present. No thyromegaly present.  Cardiovascular:  Normal rate, regular rhythm, normal heart sounds and intact distal pulses.  Exam reveals no gallop and no friction rub.   No murmur heard. Pulmonary/Chest: No respiratory distress. She has wheezes. She has rales. She exhibits no tenderness.  Rales and wheeze  Abdominal: She exhibits no distension and no mass. There is no tenderness.  Musculoskeletal: Normal range of motion. She exhibits no edema and no tenderness.  uunstable gait. Using cane.  Lymphadenopathy:    She has no cervical adenopathy.  Neurological:  She is alert and oriented to person, place, and time. No cranial nerve deficit. Coordination normal.  10/27/12 MMSE 28/30. Passed clock drawing.  Skin: No rash noted. No erythema. No pallor.  Psychiatric: She has a normal mood and affect. Her behavior is normal. Judgment and thought content normal.          Assessment & Plan:  Hypothyroid: no recent lab  Other abnormal blood chemistry: hx elevated glucose. No recent lab  Memory loss: Stable

## 2012-11-05 DIAGNOSIS — Z23 Encounter for immunization: Secondary | ICD-10-CM | POA: Diagnosis not present

## 2012-11-27 ENCOUNTER — Other Ambulatory Visit: Payer: Self-pay | Admitting: Internal Medicine

## 2012-12-03 ENCOUNTER — Telehealth: Payer: Self-pay

## 2012-12-03 NOTE — Telephone Encounter (Signed)
Message left on triage voicemail: patient needs someone to call her back to discuss medications, patient is confused about Synthroid and Levothyroxine.  I spoke with patient and informed her Synthroid and Levothyroxine are the same exact medications and she should take 1 or the other. Not bout at the same time for that would be duplicate therapy. Patient verbalized understanding

## 2013-01-22 DIAGNOSIS — H35039 Hypertensive retinopathy, unspecified eye: Secondary | ICD-10-CM | POA: Diagnosis not present

## 2013-01-22 DIAGNOSIS — Z961 Presence of intraocular lens: Secondary | ICD-10-CM | POA: Diagnosis not present

## 2013-01-22 DIAGNOSIS — H04129 Dry eye syndrome of unspecified lacrimal gland: Secondary | ICD-10-CM | POA: Diagnosis not present

## 2013-01-22 DIAGNOSIS — D3130 Benign neoplasm of unspecified choroid: Secondary | ICD-10-CM | POA: Diagnosis not present

## 2013-01-29 ENCOUNTER — Other Ambulatory Visit: Payer: Self-pay | Admitting: Internal Medicine

## 2013-02-01 DIAGNOSIS — R7989 Other specified abnormal findings of blood chemistry: Secondary | ICD-10-CM | POA: Diagnosis not present

## 2013-02-01 DIAGNOSIS — E039 Hypothyroidism, unspecified: Secondary | ICD-10-CM | POA: Diagnosis not present

## 2013-02-01 LAB — BASIC METABOLIC PANEL
BUN: 22 mg/dL — AB (ref 4–21)
Creatinine: 0.9 mg/dL (ref 0.5–1.1)
Glucose: 103 mg/dL
Potassium: 4.3 mmol/L (ref 3.4–5.3)
SODIUM: 141 mmol/L (ref 137–147)

## 2013-02-01 LAB — TSH: TSH: 0.69 u[IU]/mL (ref 0.41–5.90)

## 2013-02-09 ENCOUNTER — Non-Acute Institutional Stay: Payer: Medicare Other | Admitting: Internal Medicine

## 2013-02-09 ENCOUNTER — Encounter: Payer: Self-pay | Admitting: Internal Medicine

## 2013-02-09 VITALS — BP 120/72 | HR 80 | Wt 132.0 lb

## 2013-02-09 DIAGNOSIS — E039 Hypothyroidism, unspecified: Secondary | ICD-10-CM

## 2013-02-09 DIAGNOSIS — J45909 Unspecified asthma, uncomplicated: Secondary | ICD-10-CM

## 2013-02-09 DIAGNOSIS — R7309 Other abnormal glucose: Secondary | ICD-10-CM | POA: Diagnosis not present

## 2013-02-09 DIAGNOSIS — R0609 Other forms of dyspnea: Secondary | ICD-10-CM | POA: Diagnosis not present

## 2013-02-09 DIAGNOSIS — R0600 Dyspnea, unspecified: Secondary | ICD-10-CM

## 2013-02-09 DIAGNOSIS — R0989 Other specified symptoms and signs involving the circulatory and respiratory systems: Secondary | ICD-10-CM

## 2013-02-09 DIAGNOSIS — R413 Other amnesia: Secondary | ICD-10-CM | POA: Diagnosis not present

## 2013-02-09 DIAGNOSIS — R739 Hyperglycemia, unspecified: Secondary | ICD-10-CM

## 2013-02-09 MED ORDER — FLUTICASONE-SALMETEROL 250-50 MCG/DOSE IN AEPB: INHALATION_SPRAY | RESPIRATORY_TRACT | Status: DC

## 2013-02-09 NOTE — Progress Notes (Signed)
Patient ID: Kimberly Greer, female   DOB: 1921-04-27, 80 y.o.   MRN: [id number]    Location:  [facility]   Place of Service: Clinic (12)    Allergies  Allergen Reactions  . Avelox [Moxifloxacin Hcl In Nacl] Itching  . Doxycycline   . Hista-Tabs [Triprolidine-Pse] Other (See Comments)    Passed out  . Septra [Sulfamethoxazole-Trimethoprim]   . Sulfa Antibiotics     Itching/rash (all over)    Chief Complaint  Patient presents with  . Medical Managment of Chronic Issues    thyroid, hyperglycemia    HPI:  Hypothyroid: controlled  Hyperglycemia: mild and diet controlled  Memory loss: slow. Functions appropriately independently.   Dyspnea: benefits from Advair  Intrinsic asthma; benefits from Advair    Medications: Patient's Medications  New Prescriptions   No medications on file  Previous Medications   ACETAMINOPHEN (TYLENOL ARTHRITIS PAIN) 650 MG CR TABLET    Take 650 mg by mouth every 8 (eight) hours as needed. Take 1 tablet as needed for arthritis pain.   CALCIUM-VITAMIN D-VITAMIN K (VIACTIV) [phone] MG-UNT-MCG CHEW    Chew 1 tablet by mouth daily. Take 1 tablet daily for calcium.   EVISTA 60 MG TABLET    TAKE 1 TABLET BY MOUTH DAILY   FLUTICASONE (FLONASE) 50 MCG/ACT NASAL SPRAY    SHAKE WELL BEFORE EACH USE AND INSTILL 1 OR 2 SPRAYS IN EACH NOSTRIL ONCE DAILY TO CONTROL CONGESTION   FLUTICASONE-SALMETEROL (ADVAIR DISKUS) 100-50 MCG/DOSE AEPB    Inhale 1 puff into the lungs daily. Take 1 puff once daily   IBUPROFEN (ADVIL,MOTRIN) 200 MG TABLET    Take 200 mg by mouth. One tablet every 4 hours if needed for pain   LEVOTHYROXINE (SYNTHROID, LEVOTHROID) 50 MCG TABLET    Take 50 mcg by mouth daily. Take 1 tablet daily as a thyroid supplement.   MONTELUKAST (SINGULAIR) 10 MG TABLET    Take 1 tablet (10 mg total) by mouth daily.   MULTIPLE VITAMINS-MINERALS (MULTIVITAMIN WITH MINERALS) TABLET    Take 1 tablet by mouth daily. Take 1 tablet as a vitamin  supplement.   MYRBETRIQ 25 MG TB24 TABLET    TAKE 1 TABLET (25 MG TOTAL) BY MOUTH DAILY.   OMEPRAZOLE (PRILOSEC) 20 MG CAPSULE    Take 20 mg by mouth daily. Take 1 tablet daily for heartburn or stomach reflux.  Modified Medications   No medications on file  Discontinued Medications   RALOXIFENE (EVISTA) 60 MG TABLET    TAKE 1 TABLET BY MOUTH DAILY     Review of Systems  Constitutional: Positive for fatigue. Negative for fever, activity change and unexpected weight change.  HENT: Positive for congestion, hearing loss, postnasal drip and rhinorrhea. Negative for nosebleeds.   Eyes: Positive for visual disturbance.  Respiratory: Positive for cough, shortness of breath and wheezing. Negative for choking, chest tightness and stridor.   Cardiovascular: Negative for chest pain, palpitations and leg swelling.  Endocrine: Negative for cold intolerance, heat intolerance, polydipsia, polyphagia and polyuria.  Genitourinary: Negative for dysuria, frequency, flank pain and difficulty urinating.  Musculoskeletal: Positive for gait problem. Negative for joint swelling and myalgias.  Skin: Negative.   Neurological: Negative for dizziness, tremors, seizures and headaches.  Hematological: Negative.   Psychiatric/Behavioral: Negative.     Filed Vitals:   02/09/13 1031  BP: 120/72  Pulse: 80  Weight: 132 lb (59.875 kg)   Physical Exam  Constitutional: She is oriented to person, place, and time. She  appears well-developed and well-nourished. No distress.  HENT:  Head: Normocephalic and atraumatic.  Right Ear: External ear normal.  Left Ear: External ear normal.  Nose: Nose normal.  Mouth/Throat: Oropharynx is clear and moist.  Mild hearing deficit  Eyes:  Corrective lenses  Neck: No JVD present. No tracheal deviation present. No thyromegaly present.  Cardiovascular: Normal rate, regular rhythm, normal heart sounds and intact distal pulses.  Exam reveals no gallop and no friction rub.   No  murmur heard. Pulmonary/Chest: No respiratory distress. She has wheezes. She has rales. She exhibits no tenderness.  Rales and wheeze  Abdominal: She exhibits no distension and no mass. There is no tenderness.  Musculoskeletal: Normal range of motion. She exhibits no edema and no tenderness.  uunstable gait. Using cane.  Lymphadenopathy:    She has no cervical adenopathy.  Neurological: She is alert and oriented to person, place, and time. No cranial nerve deficit. Coordination normal.  10/27/12 MMSE 28/30. Passed clock drawing.  Skin: No rash noted. No erythema. No pallor.  Psychiatric: She has a normal mood and affect. Her behavior is normal. Judgment and thought content normal.     Labs reviewed: Abstract on 02/05/2013  Component Date Value Range Status  . Glucose 02/01/2013 103   Final  . BUN 02/01/2013 22* 4 - 21 mg/dL Final  . Creatinine 02/01/2013 0.9  0.5 - 1.1 mg/dL Final  . Potassium 02/01/2013 4.3  3.4 - 5.3 mmol/L Final  . Sodium 02/01/2013 141  137 - 147 mmol/L Final  . TSH 02/01/2013 0.69  0.41 - 5.90 uIU/mL Final      Assessment/Plan  1. Hypothyroid Continue current medications  2. Hyperglycemia Continue to avoid sweets  3. Memory loss Continue to monitor  4. Dyspnea - Fluticasone-Salmeterol (ADVAIR DISKUS) 250-50 MCG/DOSE AEPB; Inhale once daily to help breathing  Dispense: 1 each; Refill: 3  5. Intrinsic asthma - Fluticasone-Salmeterol (ADVAIR DISKUS) 250-50 MCG/DOSE AEPB; Inhale once daily to help breathing  Dispense: 1 each; Refill: 3

## 2013-03-06 NOTE — Patient Instructions (Signed)
Continue current medications. Avoid concentrated sweets.

## 2013-03-13 ENCOUNTER — Other Ambulatory Visit: Payer: Self-pay | Admitting: Internal Medicine

## 2013-03-15 ENCOUNTER — Other Ambulatory Visit: Payer: Self-pay | Admitting: *Deleted

## 2013-03-15 MED ORDER — MONTELUKAST SODIUM 10 MG PO TABS: ORAL_TABLET | ORAL | Status: DC

## 2013-03-23 ENCOUNTER — Other Ambulatory Visit: Payer: Self-pay | Admitting: Internal Medicine

## 2013-03-24 ENCOUNTER — Other Ambulatory Visit: Payer: Self-pay | Admitting: Internal Medicine

## 2013-03-24 ENCOUNTER — Other Ambulatory Visit: Payer: Self-pay | Admitting: *Deleted

## 2013-03-24 MED ORDER — LEVOTHYROXINE SODIUM 50 MCG PO TABS: ORAL_TABLET | ORAL | Status: DC

## 2013-03-26 ENCOUNTER — Other Ambulatory Visit: Payer: Self-pay | Admitting: Internal Medicine

## 2013-03-26 ENCOUNTER — Other Ambulatory Visit: Payer: Self-pay | Admitting: *Deleted

## 2013-03-26 MED ORDER — MIRABEGRON ER 25 MG PO TB24: ORAL_TABLET | ORAL | Status: DC

## 2013-03-26 MED ORDER — RALOXIFENE HCL 60 MG PO TABS: ORAL_TABLET | ORAL | Status: DC

## 2013-03-27 ENCOUNTER — Other Ambulatory Visit: Payer: Self-pay | Admitting: Internal Medicine

## 2013-07-29 ENCOUNTER — Other Ambulatory Visit: Payer: Self-pay | Admitting: Internal Medicine

## 2013-07-29 NOTE — Telephone Encounter (Signed)
[employer] Francis King 

## 2013-08-10 ENCOUNTER — Encounter: Payer: Self-pay | Admitting: Internal Medicine

## 2013-08-10 ENCOUNTER — Non-Acute Institutional Stay: Payer: Medicare Other | Admitting: Internal Medicine

## 2013-08-10 VITALS — BP 118/66 | HR 72 | Wt 135.0 lb

## 2013-08-10 DIAGNOSIS — R7309 Other abnormal glucose: Secondary | ICD-10-CM | POA: Diagnosis not present

## 2013-08-10 DIAGNOSIS — E785 Hyperlipidemia, unspecified: Secondary | ICD-10-CM

## 2013-08-10 DIAGNOSIS — R739 Hyperglycemia, unspecified: Secondary | ICD-10-CM

## 2013-08-10 DIAGNOSIS — J4520 Mild intermittent asthma, uncomplicated: Secondary | ICD-10-CM

## 2013-08-10 DIAGNOSIS — R413 Other amnesia: Secondary | ICD-10-CM | POA: Diagnosis not present

## 2013-08-10 DIAGNOSIS — J45909 Unspecified asthma, uncomplicated: Secondary | ICD-10-CM

## 2013-08-10 DIAGNOSIS — E039 Hypothyroidism, unspecified: Secondary | ICD-10-CM

## 2013-08-10 NOTE — Progress Notes (Signed)
Patient ID: Kimberly Greer, female   DOB: 1921/05/27, 80 y.o.   MRN: [id number]    Location:  [facility]   Place of Service: Clinic (12)    Allergies  Allergen Reactions  . Avelox [Moxifloxacin Hcl In Nacl] Itching  . Doxycycline   . Hista-Tabs [Triprolidine-Pse] Other (See Comments)    Passed out  . Septra [Sulfamethoxazole-Trimethoprim]   . Sulfa Antibiotics     Itching/rash (all over)    Chief Complaint  Patient presents with  . Medical Management of Chronic Issues    thyroid, hyperglycemia    HPI:  Hypothyroidism, unspecified hypothyroidism type: no recent lab. Asymptomatic  Hyperglycemia: no recent lab  Other and unspecified hyperlipidemia:no recent lab  Memory loss: having some trouble with name recall  Intrinsic asthma, mild intermittent, uncomplicated: using inhalers regularly. Stable. Occasional wheeze.    Medications: Patient's Medications  New Prescriptions   No medications on file  Previous Medications   CALCIUM-VITAMIN D-VITAMIN K (VIACTIV) 500-500-40 MG-UNT-MCG CHEW    Chew 1 tablet by mouth daily. Take 1 tablet daily for calcium.   EVISTA 60 MG TABLET    TAKE 1 TABLET BY MOUTH DAILY   FLUTICASONE (FLONASE) 50 MCG/ACT NASAL SPRAY    SHAKE WELL BEFORE EACH USE AND INSTILL 1 OR 2 SPRAYS IN EACH NOSTRIL ONCE DAILY TO CONTROL CONGESTION   FLUTICASONE-SALMETEROL (ADVAIR DISKUS) 250-50 MCG/DOSE AEPB    Inhale once daily to help breathing   IBUPROFEN (ADVIL,MOTRIN) 200 MG TABLET    Take 200 mg by mouth. One tablet daily as  needed for pain   LEVOTHYROXINE (SYNTHROID, LEVOTHROID) 50 MCG TABLET    Take one tablet by mouth once daily for thyroid   MIRABEGRON ER (MYRBETRIQ) 25 MG TB24 TABLET    Take one tablet by mouth once daily for bladder   MONTELUKAST (SINGULAIR) 10 MG TABLET    Take one tablet by mouth once daily   MULTIPLE VITAMINS-MINERALS (MULTIVITAMIN WITH MINERALS) TABLET    Take 1 tablet by mouth daily. Take 1 tablet as a vitamin supplement.    OMEPRAZOLE (PRILOSEC) 20 MG CAPSULE    Take 20 mg by mouth daily. Take 1 tablet daily for heartburn or stomach reflux.   RALOXIFENE (EVISTA) 60 MG TABLET    Take one tablet by mouth once daily  Modified Medications   No medications on file  Discontinued Medications   ACETAMINOPHEN (TYLENOL ARTHRITIS PAIN) 650 MG CR TABLET    Take 650 mg by mouth every 8 (eight) hours as needed. Take 1 tablet as needed for arthritis pain.   LEVOTHYROXINE (SYNTHROID, LEVOTHROID) 50 MCG TABLET    TAKE ONE TABLET BY MOUTH ONCE DAILY   MIRABEGRON ER (MYRBETRIQ) 25 MG TB24 TABLET    Take one tablet by mouth once daily   RALOXIFENE (EVISTA) 60 MG TABLET    TAKE 1 TABLET BY MOUTH DAILY     Review of Systems  Constitutional: Positive for fatigue. Negative for fever, activity change and unexpected weight change.  HENT: Positive for congestion, hearing loss, postnasal drip and rhinorrhea. Negative for nosebleeds.   Eyes: Positive for visual disturbance (corrective lenses).  Respiratory: Positive for shortness of breath and wheezing. Negative for cough, choking, chest tightness and stridor.   Cardiovascular: Negative for chest pain, palpitations and leg swelling.  Endocrine: Negative for cold intolerance, heat intolerance, polydipsia, polyphagia and polyuria.  Genitourinary: Negative for dysuria, frequency, flank pain and difficulty urinating.  Musculoskeletal: Positive for gait problem. Negative for joint swelling and  myalgias.  Skin: Negative.   Neurological: Negative for dizziness, tremors, seizures and headaches.  Hematological: Negative.   Psychiatric/Behavioral: Negative.     Filed Vitals:   08/10/13 1107  BP: 118/66  Pulse: 72  Weight: 135 lb (61.236 kg)   Body mass index is 25.52 kg/(m^2).  Physical Exam  Constitutional: She is oriented to person, place, and time. She appears well-developed and well-nourished. No distress.  HENT:  Head: Normocephalic and atraumatic.  Right Ear: External ear  normal.  Left Ear: External ear normal.  Nose: Nose normal.  Mouth/Throat: Oropharynx is clear and moist.  Mild hearing deficit  Eyes:  Corrective lenses  Neck: No JVD present. No tracheal deviation present. No thyromegaly present.  Cardiovascular: Normal rate, regular rhythm, normal heart sounds and intact distal pulses.  Exam reveals no gallop and no friction rub.   No murmur heard. Pulmonary/Chest: No respiratory distress. She has wheezes. She has rales. She exhibits no tenderness.  Rales and wheeze  Abdominal: She exhibits no distension and no mass. There is no tenderness.  Musculoskeletal: Normal range of motion. She exhibits no edema and no tenderness.  uunstable gait. Using cane.  Lymphadenopathy:    She has no cervical adenopathy.  Neurological: She is alert and oriented to person, place, and time. No cranial nerve deficit. Coordination normal.  10/27/12 MMSE 28/30. Passed clock drawing.  Skin: No rash noted. No erythema. No pallor.  Psychiatric: She has a normal mood and affect. Her behavior is normal. Judgment and thought content normal.     Labs reviewed: No visits with results within 3 Month(s) from this visit. Latest known visit with results is:  Abstract on 02/05/2013  Component Date Value Ref Range Status  . Glucose 02/01/2013 103   Final  . BUN 02/01/2013 22* 4 - 21 mg/dL Final  . Creatinine 02/01/2013 0.9  0.5 - 1.1 mg/dL Final  . Potassium 02/01/2013 4.3  3.4 - 5.3 mmol/L Final  . Sodium 02/01/2013 141  137 - 147 mmol/L Final  . TSH 02/01/2013 0.69  0.41 - 5.90 uIU/mL Final      Assessment/Plan  1. Hypothyroidism, unspecified hypothyroidism type continue levothyroxine -TSH  2. Hyperglycemia -CMP  3. Other and unspecified hyperlipidemia -Lipids  4. Memory loss -MMSE  5. Intrinsic asthma, mild intermittent, uncomplicated Continue current medications

## 2013-09-24 ENCOUNTER — Other Ambulatory Visit: Payer: Self-pay | Admitting: Internal Medicine

## 2013-10-11 ENCOUNTER — Other Ambulatory Visit: Payer: Self-pay | Admitting: Internal Medicine

## 2013-10-12 ENCOUNTER — Other Ambulatory Visit: Payer: Self-pay | Admitting: *Deleted

## 2013-10-12 DIAGNOSIS — R0600 Dyspnea, unspecified: Secondary | ICD-10-CM

## 2013-10-12 MED ORDER — FLUTICASONE-SALMETEROL 250-50 MCG/DOSE IN AEPB: INHALATION_SPRAY | RESPIRATORY_TRACT | Status: DC

## 2013-10-12 NOTE — Telephone Encounter (Signed)
[employer] Francis King 

## 2013-10-23 ENCOUNTER — Other Ambulatory Visit: Payer: Self-pay | Admitting: Nurse Practitioner

## 2013-10-25 ENCOUNTER — Other Ambulatory Visit: Payer: Self-pay | Admitting: *Deleted

## 2013-10-25 MED ORDER — FLUTICASONE PROPIONATE 50 MCG/ACT NA SUSP: NASAL | Status: DC

## 2013-10-25 NOTE — Telephone Encounter (Signed)
[employer] Francis King 

## 2013-11-20 DIAGNOSIS — Z23 Encounter for immunization: Secondary | ICD-10-CM | POA: Diagnosis not present

## 2013-11-27 ENCOUNTER — Other Ambulatory Visit: Payer: Self-pay | Admitting: Internal Medicine

## 2014-01-31 DIAGNOSIS — H35033 Hypertensive retinopathy, bilateral: Secondary | ICD-10-CM | POA: Diagnosis not present

## 2014-01-31 DIAGNOSIS — H26491 Other secondary cataract, right eye: Secondary | ICD-10-CM | POA: Diagnosis not present

## 2014-01-31 DIAGNOSIS — H35362 Drusen (degenerative) of macula, left eye: Secondary | ICD-10-CM | POA: Diagnosis not present

## 2014-02-08 ENCOUNTER — Other Ambulatory Visit: Payer: Self-pay | Admitting: Internal Medicine

## 2014-02-14 DIAGNOSIS — R7309 Other abnormal glucose: Secondary | ICD-10-CM | POA: Diagnosis not present

## 2014-02-14 DIAGNOSIS — E039 Hypothyroidism, unspecified: Secondary | ICD-10-CM | POA: Diagnosis not present

## 2014-02-14 DIAGNOSIS — E785 Hyperlipidemia, unspecified: Secondary | ICD-10-CM | POA: Diagnosis not present

## 2014-02-14 LAB — BASIC METABOLIC PANEL
BUN: 20 mg/dL (ref 4–21)
Creatinine: 0.9 mg/dL (ref 0.5–1.1)
GLUCOSE: 95 mg/dL
POTASSIUM: 4.9 mmol/L (ref 3.4–5.3)
SODIUM: 142 mmol/L (ref 137–147)

## 2014-02-14 LAB — HEPATIC FUNCTION PANEL
ALT: 17 U/L (ref 7–35)
AST: 20 U/L (ref 13–35)
Alkaline Phosphatase: 62 U/L (ref 25–125)
Bilirubin, Total: 0.4 mg/dL

## 2014-02-14 LAB — TSH: TSH: 0.55 u[IU]/mL (ref 0.41–5.90)

## 2014-02-14 LAB — LIPID PANEL
CHOLESTEROL: 211 mg/dL — AB (ref 0–200)
HDL: 47 mg/dL (ref 35–70)
LDL CALC: 133 mg/dL
Triglycerides: 155 mg/dL (ref 40–160)

## 2014-02-18 ENCOUNTER — Encounter: Payer: Self-pay | Admitting: Internal Medicine

## 2014-02-22 ENCOUNTER — Encounter: Payer: Self-pay | Admitting: Internal Medicine

## 2014-02-22 ENCOUNTER — Other Ambulatory Visit: Payer: Self-pay | Admitting: Internal Medicine

## 2014-02-23 ENCOUNTER — Other Ambulatory Visit: Payer: Self-pay | Admitting: Internal Medicine

## 2014-02-23 ENCOUNTER — Other Ambulatory Visit: Payer: Self-pay | Admitting: *Deleted

## 2014-02-23 MED ORDER — LEVOTHYROXINE SODIUM 50 MCG PO TABS: ORAL_TABLET | ORAL | Status: DC

## 2014-02-23 NOTE — Telephone Encounter (Signed)
Kimberly Greer Kimberly Greer 

## 2014-03-08 ENCOUNTER — Encounter: Payer: Self-pay | Admitting: Internal Medicine

## 2014-03-08 ENCOUNTER — Non-Acute Institutional Stay: Payer: Medicare Other | Admitting: Internal Medicine

## 2014-03-08 VITALS — BP 128/64 | HR 77 | Temp 97.8°F | Ht 59.0 in | Wt 138.0 lb

## 2014-03-08 DIAGNOSIS — J4520 Mild intermittent asthma, uncomplicated: Secondary | ICD-10-CM | POA: Diagnosis not present

## 2014-03-08 DIAGNOSIS — R739 Hyperglycemia, unspecified: Secondary | ICD-10-CM

## 2014-03-08 DIAGNOSIS — E785 Hyperlipidemia, unspecified: Secondary | ICD-10-CM

## 2014-03-08 DIAGNOSIS — M6530 Trigger finger, unspecified finger: Secondary | ICD-10-CM

## 2014-03-08 DIAGNOSIS — R269 Unspecified abnormalities of gait and mobility: Secondary | ICD-10-CM | POA: Diagnosis not present

## 2014-03-08 DIAGNOSIS — R413 Other amnesia: Secondary | ICD-10-CM

## 2014-03-08 DIAGNOSIS — M1990 Unspecified osteoarthritis, unspecified site: Secondary | ICD-10-CM | POA: Diagnosis not present

## 2014-03-08 DIAGNOSIS — E039 Hypothyroidism, unspecified: Secondary | ICD-10-CM

## 2014-03-08 DIAGNOSIS — N3946 Mixed incontinence: Secondary | ICD-10-CM | POA: Diagnosis not present

## 2014-03-08 MED ORDER — RALOXIFENE HCL 60 MG PO TABS: ORAL_TABLET | ORAL | Status: DC

## 2014-03-08 MED ORDER — MONTELUKAST SODIUM 10 MG PO TABS: ORAL_TABLET | ORAL | Status: DC

## 2014-03-08 NOTE — Progress Notes (Signed)
Passed clock drawing 

## 2014-03-08 NOTE — Progress Notes (Signed)
Patient ID: Kimberly Greer, female   DOB: 1921-05-13, 81 y.o.   MRN: [id number]    HISTORY AND PHYSICAL  Location:  [place] of Service: Clinic (12)   Extended Emergency Contact Information Primary Emergency Contact: Teofilo [place] of [place] Phone: [phone] Work Phone: [phone] Relation: Other Secondary Emergency Contact: [place],Sheron Tallman Address: [address], [place] Phone: [phone] Work Phone: [phone] Mobile Phone: [phone] Relation: Son  Advanced Directive information Does patient have an advance directive?: Yes, Type of Advance Directive: Healthcare Power of [place];Living will  Chief Complaint  Patient presents with  . Annual Exam    Comprehensive exam: thyroid, hyperglycemia, cholesterol, memory    HPI:  Hypothyroidism, unspecified hypothyroidism type: Controlled  Hyperglycemia: Most recent lab was normal  Hyperlipemia: Mild increase in LDL offset by elevated HDL.  Abnormality of gait: Using cane regularly. Unsteady when walking.  Memory loss: She is aware memory is getting worse. MMSE 25/30. Passed clock drawing.  Osteoarthritis, unspecified osteoarthritis type, unspecified site: Multiple arthritic sites including shoulders, hands, hips, and knees.  Trigger finger, acquired: Right third finger proximal interphalangeal joint. Onset about mid January 2016  Intrinsic asthma, mild intermittent, uncomplicated: Denies wheezing. Short of breath with walking more than 100 feet.  Mixed incontinence: Chronic and unchanged. Myrbetriq has been helpful.    Past Medical History  Diagnosis Date  . Asthma   . Hypothyroid   . Senile osteoporosis 06/16/2010  . Other abnormal blood chemistry 06/19/2010    hyperglycemia  . Pain in limb 12/05/2009  . Other and unspecified hyperlipidemia 10/24/2009  . Syncope and collapse 05/30/2009  . Carpal tunnel syndrome  03/07/2009  . Sensorineural hearing loss, unspecified 02/21/2009  . Unspecified nasal polyp 02/21/2009  . Disturbance of skin sensation 02/21/2009  . Unspecified urinary incontinence 02/21/2009  . Diverticulosis of colon (without mention of hemorrhage) 02/16/2009  . 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 Other atopic dermatitis and related conditions 02/16/2009  . Osteoarthrosis, unspecified whether generalized or localized, unspecified site 02/16/2009  . Otosclerosis, unspecified 02/22/2003    Past Surgical History  Procedure Laterality Date  . Breast biopsy  07/16/1990    benign left breast needle biopsy  Dr. Margot Chimes  . Abdominal hysterectomy  1999    Dr. Collier Bullock  . Cataract extraction w/ intraocular lens  implant, bilateral Bilateral   . Carpal tunnel release  04/07/2009    right Dr. Daylene Katayama    Patient Care Team: [name], MD as PCP - General (Internal Medicine) Melina Schools, MD as Consulting Physician (Obstetrics and Gynecology) Johna Sheriff, MD as Consulting Physician (Ophthalmology) Melida Quitter, MD as Consulting Physician (Otolaryngology) Myrlene Broker, MD as Consulting Physician (Urology) [facility]  History   Social History  . Marital Status: Widowed    Spouse Name: N/A    Number of Children: N/A  . Years of Education: N/A   Occupational History  . retired Sales promotion account executive    Social History Main Topics  . Smoking status: Never Smoker   . Smokeless tobacco: Never Used  . Alcohol Use: No  . Drug Use: No  . Sexual Activity: No   Other Topics Concern  . Not on file   Social History Narrative   Lives at [facility] since 12/06/2008   Widowed 11/2006   Living Will   Walks with cane  Never smoked   Alcohol none   Exercise -walks daily    POA/Living Will     reports that she has never smoked. She has never used smokeless tobacco. She reports that she does not drink alcohol or use illicit drugs.  Family History  Problem Relation Age of  Onset  . Cancer Mother     stomach  . Cancer Father     bone  . Dementia Sister   . Cancer Brother     pancreataic  . Cancer Son     pancreatic   Family Status  Relation Status Death Age  . Mother Deceased 90    stomach cancer  . Father Deceased 31    bone cancer  . Sister Deceased 42    dementia  . Brother Deceased     pancreatic cancer  . Son Deceased 10  . Son Alive   . Sister Deceased 6    cause unknown    Immunization History  Administered Date(s) Administered  . Influenza Whole 12/27/2011, 11/05/2012  . Influenza-Unspecified 11/18/2013  . PPD Test 04/04/2008  . Pneumococcal Polysaccharide-23 02/05/2008, 06/03/2008  . Td 02/05/2008, 06/03/2008    Allergies  Allergen Reactions  . Avelox [Moxifloxacin Hcl In Nacl] Itching  . Doxycycline   . Hista-Tabs [Triprolidine-Pse] Other (See Comments)    Passed out  . Septra [Sulfamethoxazole-Trimethoprim]   . Sulfa Antibiotics     Itching/rash (all over)    Medications: Patient's Medications  New Prescriptions   No medications on file  Previous Medications   CALCIUM-VITAMIN D-VITAMIN K (VIACTIV) 500-500-40 MG-UNT-MCG CHEW    Chew 1 tablet by mouth daily. Take 1 tablet daily for calcium.   EVISTA 60 MG TABLET    TAKE 1 TABLET BY MOUTH DAILY   FLUTICASONE (FLONASE) 50 MCG/ACT NASAL SPRAY    Shake well before each use and instill one to two sprays in each nostril once daily to control congestion   FLUTICASONE-SALMETEROL (ADVAIR DISKUS) 250-50 MCG/DOSE AEPB    Inhale once daily to help breathing   IBUPROFEN (ADVIL,MOTRIN) 200 MG TABLET    Take 200 mg by mouth. One tablet daily as  needed for pain   LEVOTHYROXINE (SYNTHROID, LEVOTHROID) 50 MCG TABLET    TAKE ONE TABLET BY MOUTH ONCE DAILY   LEVOTHYROXINE (SYNTHROID, LEVOTHROID) 50 MCG TABLET    Take one tablet by mouth once daily for thyroid   MONTELUKAST (SINGULAIR) 10 MG TABLET    TAKE ONE TABLET BY MOUTH ONCE DAILY   MULTIPLE VITAMINS-MINERALS (MULTIVITAMIN WITH  MINERALS) TABLET    Take 1 tablet by mouth daily. Take 1 tablet as a vitamin supplement.   MYRBETRIQ 25 MG TB24 TABLET    TAKE ONE TABLET BY MOUTH ONCE DAILY FOR BLADDER   OMEPRAZOLE (PRILOSEC) 20 MG CAPSULE    Take 20 mg by mouth daily. Take 1 tablet daily for heartburn or stomach reflux.   RALOXIFENE (EVISTA) 60 MG TABLET    TAKE ONE TABLET BY MOUTH ONCE DAILY  Modified Medications   No medications on file  Discontinued Medications   No medications on file    Review of Systems  Constitutional: Positive for fatigue. Negative for fever, activity change and unexpected weight change.  HENT: Positive for congestion, hearing loss, postnasal drip and rhinorrhea. Negative for nosebleeds.   Eyes: Positive for visual disturbance (corrective lenses).  Respiratory: Positive for shortness of breath and wheezing. Negative for cough, choking, chest tightness and stridor.   Cardiovascular: Negative for chest pain, palpitations and  leg swelling.  Endocrine: Negative for cold intolerance, heat intolerance, polydipsia, polyphagia and polyuria.  Genitourinary: Negative for dysuria, frequency, flank pain and difficulty urinating.  Musculoskeletal: Positive for gait problem. Negative for myalgias and joint swelling.  Skin: Negative.   Neurological: Negative for dizziness, tremors, seizures and headaches.  Hematological: Negative.   Psychiatric/Behavioral: Negative.     Filed Vitals:   03/08/14 1108  BP: 128/64  Pulse: 77  Temp: 97.8 F (36.6 C)  TempSrc: Oral  Height: 4\' 11"  (1.499 m)  Weight: 138 lb (62.596 kg)  SpO2: 98%   Body mass index is 27.86 kg/(m^2).  Physical Exam  Constitutional: She is oriented to person, place, and time. She appears well-developed and well-nourished. No distress.  HENT:  Head: Normocephalic and atraumatic.  Right Ear: External ear normal.  Left Ear: External ear normal.  Nose: Nose normal.  Mouth/Throat: Oropharynx is clear and moist.  Mild hearing deficit    Eyes:  Corrective lenses  Neck: No JVD present. No tracheal deviation present. No thyromegaly present.  Cardiovascular: Normal rate, regular rhythm, normal heart sounds and intact distal pulses.  Exam reveals no gallop and no friction rub.   No murmur heard. Pulmonary/Chest: No respiratory distress. She has no wheezes. She has rales. She exhibits no tenderness.  Rales and wheeze  Abdominal: She exhibits no distension and no mass. There is no tenderness.  Musculoskeletal: Normal range of motion. She exhibits no edema or tenderness.  uunstable gait. Using cane.  Lymphadenopathy:    She has no cervical adenopathy.  Neurological: She is alert and oriented to person, place, and time. No cranial nerve deficit. Coordination normal.  10/27/12 MMSE 28/30. Passed clock drawing.  Skin: No rash noted. No erythema. No pallor.  Psychiatric: She has a normal mood and affect. Her behavior is normal. Judgment and thought content normal.     Labs reviewed: Abstract on 02/14/2014  Component Date Value Ref Range Status  . Glucose 02/14/2014 95   Final  . BUN 02/14/2014 20  4 - 21 mg/dL Final  . Creatinine 02/14/2014 0.9  0.5 - 1.1 mg/dL Final  . Potassium 02/14/2014 4.9  3.4 - 5.3 mmol/L Final  . Sodium 02/14/2014 142  137 - 147 mmol/L Final  . Triglycerides 02/14/2014 155  40 - 160 mg/dL Final  . Cholesterol 02/14/2014 211* 0 - 200 mg/dL Final  . HDL 02/14/2014 47  35 - 70 mg/dL Final  . LDL Cholesterol 02/14/2014 133   Final  . Alkaline Phosphatase 02/14/2014 62  25 - 125 U/L Final  . ALT 02/14/2014 17  7 - 35 U/L Final  . AST 02/14/2014 20  13 - 35 U/L Final  . Bilirubin, Total 02/14/2014 0.4   Final  . TSH 02/14/2014 0.55  0.41 - 5.90 uIU/mL Final      Assessment/Plan 1. Hypothyroidism, unspecified hypothyroidism type Stable. Continue current dose levothyroxine.  2. Hyperglycemia Improved  3. Hyperlipemia Stable  4. Abnormality of gait Continue use cane or walker  5. Memory  loss Slowly progressing  6. Osteoarthritis, unspecified osteoarthritis type, unspecified site Observe  7. Trigger finger, acquired Refer to Dr. Fredna Dow  8. Intrinsic asthma, mild intermittent, uncomplicated Stable  9. Mixed incontinence Unchanged

## 2014-03-17 ENCOUNTER — Other Ambulatory Visit: Payer: Self-pay

## 2014-03-17 DIAGNOSIS — M6530 Trigger finger, unspecified finger: Secondary | ICD-10-CM

## 2014-03-23 ENCOUNTER — Other Ambulatory Visit: Payer: Self-pay | Admitting: Internal Medicine

## 2014-03-30 ENCOUNTER — Other Ambulatory Visit: Payer: Self-pay | Admitting: Nurse Practitioner

## 2014-04-04 DIAGNOSIS — M65332 Trigger finger, left middle finger: Secondary | ICD-10-CM | POA: Diagnosis not present

## 2014-04-04 DIAGNOSIS — M152 Bouchard's nodes (with arthropathy): Secondary | ICD-10-CM | POA: Diagnosis not present

## 2014-04-04 DIAGNOSIS — M151 Heberden's nodes (with arthropathy): Secondary | ICD-10-CM | POA: Diagnosis not present

## 2014-05-02 DIAGNOSIS — M151 Heberden's nodes (with arthropathy): Secondary | ICD-10-CM | POA: Diagnosis not present

## 2014-05-02 DIAGNOSIS — M152 Bouchard's nodes (with arthropathy): Secondary | ICD-10-CM | POA: Diagnosis not present

## 2014-05-02 DIAGNOSIS — M65332 Trigger finger, left middle finger: Secondary | ICD-10-CM | POA: Diagnosis not present

## 2014-06-06 ENCOUNTER — Other Ambulatory Visit: Payer: Self-pay | Admitting: *Deleted

## 2014-06-06 DIAGNOSIS — R0600 Dyspnea, unspecified: Secondary | ICD-10-CM

## 2014-06-06 MED ORDER — FLUTICASONE-SALMETEROL 250-50 MCG/DOSE IN AEPB: INHALATION_SPRAY | RESPIRATORY_TRACT | Status: DC

## 2014-06-06 MED ORDER — FLUTICASONE PROPIONATE 50 MCG/ACT NA SUSP: NASAL | Status: DC

## 2014-06-06 MED ORDER — RALOXIFENE HCL 60 MG PO TABS: ORAL_TABLET | ORAL | Status: DC

## 2014-06-06 NOTE — Telephone Encounter (Signed)
[facility] 

## 2014-06-07 ENCOUNTER — Other Ambulatory Visit: Payer: Self-pay | Admitting: *Deleted

## 2014-06-07 DIAGNOSIS — J4520 Mild intermittent asthma, uncomplicated: Secondary | ICD-10-CM

## 2014-06-07 MED ORDER — MONTELUKAST SODIUM 10 MG PO TABS: ORAL_TABLET | ORAL | Status: DC

## 2014-06-07 MED ORDER — LEVOTHYROXINE SODIUM 50 MCG PO TABS
ORAL_TABLET | ORAL | Status: DC
Start: 2014-06-07 — End: 2015-09-05

## 2014-06-07 MED ORDER — MIRABEGRON ER 25 MG PO TB24: ORAL_TABLET | ORAL | Status: DC

## 2014-06-07 NOTE — Telephone Encounter (Signed)
[facility] 

## 2014-07-11 DIAGNOSIS — M151 Heberden's nodes (with arthropathy): Secondary | ICD-10-CM | POA: Diagnosis not present

## 2014-07-11 DIAGNOSIS — M65332 Trigger finger, left middle finger: Secondary | ICD-10-CM | POA: Diagnosis not present

## 2014-07-11 DIAGNOSIS — M152 Bouchard's nodes (with arthropathy): Secondary | ICD-10-CM | POA: Diagnosis not present

## 2014-07-25 DIAGNOSIS — M152 Bouchard's nodes (with arthropathy): Secondary | ICD-10-CM | POA: Diagnosis not present

## 2014-07-25 DIAGNOSIS — M65332 Trigger finger, left middle finger: Secondary | ICD-10-CM | POA: Diagnosis not present

## 2014-07-25 DIAGNOSIS — M151 Heberden's nodes (with arthropathy): Secondary | ICD-10-CM | POA: Diagnosis not present

## 2014-07-27 ENCOUNTER — Other Ambulatory Visit: Payer: Self-pay | Admitting: Orthopedic Surgery

## 2014-07-28 ENCOUNTER — Other Ambulatory Visit: Payer: Self-pay | Admitting: Orthopedic Surgery

## 2014-08-03 ENCOUNTER — Encounter (HOSPITAL_BASED_OUTPATIENT_CLINIC_OR_DEPARTMENT_OTHER): Payer: Self-pay | Admitting: *Deleted

## 2014-08-03 NOTE — Progress Notes (Signed)
This patient lives independently at [facility] in her own apartment. She has already contacted the assisted living area there and arranged for an overnight stay there after surgery. Their transportation assistant will bring her and pick her up after surgery.

## 2014-08-09 ENCOUNTER — Ambulatory Visit (HOSPITAL_BASED_OUTPATIENT_CLINIC_OR_DEPARTMENT_OTHER): Payer: Medicare Other | Admitting: Anesthesiology

## 2014-08-09 ENCOUNTER — Encounter (HOSPITAL_BASED_OUTPATIENT_CLINIC_OR_DEPARTMENT_OTHER): Payer: Self-pay | Admitting: Orthopedic Surgery

## 2014-08-09 ENCOUNTER — Ambulatory Visit (HOSPITAL_BASED_OUTPATIENT_CLINIC_OR_DEPARTMENT_OTHER)
Admission: RE | Admit: 2014-08-09 | Discharge: 2014-08-09 | Disposition: A | Discharge status: Home / Self Care | Payer: Medicare Other | Source: Ambulatory Visit | Attending: Orthopedic Surgery | Admitting: Orthopedic Surgery

## 2014-08-09 ENCOUNTER — Encounter (HOSPITAL_BASED_OUTPATIENT_CLINIC_OR_DEPARTMENT_OTHER)
Admission: RE | Disposition: A | Discharge status: Home / Self Care | Payer: Self-pay | Source: Ambulatory Visit | Attending: Orthopedic Surgery

## 2014-08-09 DIAGNOSIS — E039 Hypothyroidism, unspecified: Secondary | ICD-10-CM | POA: Insufficient documentation

## 2014-08-09 DIAGNOSIS — M152 Bouchard's nodes (with arthropathy): Secondary | ICD-10-CM | POA: Diagnosis not present

## 2014-08-09 DIAGNOSIS — H905 Unspecified sensorineural hearing loss: Secondary | ICD-10-CM | POA: Insufficient documentation

## 2014-08-09 DIAGNOSIS — M65332 Trigger finger, left middle finger: Secondary | ICD-10-CM | POA: Insufficient documentation

## 2014-08-09 DIAGNOSIS — Z79899 Other long term (current) drug therapy: Secondary | ICD-10-CM | POA: Insufficient documentation

## 2014-08-09 DIAGNOSIS — M810 Age-related osteoporosis without current pathological fracture: Secondary | ICD-10-CM | POA: Diagnosis not present

## 2014-08-09 DIAGNOSIS — Z792 Long term (current) use of antibiotics: Secondary | ICD-10-CM | POA: Insufficient documentation

## 2014-08-09 DIAGNOSIS — Z79810 Long term (current) use of selective estrogen receptor modulators (SERMs): Secondary | ICD-10-CM | POA: Diagnosis not present

## 2014-08-09 DIAGNOSIS — M65842 Other synovitis and tenosynovitis, left hand: Secondary | ICD-10-CM | POA: Diagnosis not present

## 2014-08-09 DIAGNOSIS — J45909 Unspecified asthma, uncomplicated: Secondary | ICD-10-CM | POA: Diagnosis not present

## 2014-08-09 DIAGNOSIS — M151 Heberden's nodes (with arthropathy): Secondary | ICD-10-CM | POA: Insufficient documentation

## 2014-08-09 DIAGNOSIS — Z7951 Long term (current) use of inhaled steroids: Secondary | ICD-10-CM | POA: Diagnosis not present

## 2014-08-09 HISTORY — PX: TRIGGER FINGER RELEASE: SHX641

## 2014-08-09 LAB — POCT HEMOGLOBIN-HEMACUE: Hemoglobin: 14 g/dL (ref 12.0–15.0)

## 2014-08-09 SURGERY — RELEASE, A1 PULLEY, FOR TRIGGER FINGER
Anesthesia: Monitor Anesthesia Care | Site: Finger | Laterality: Left

## 2014-08-09 MED ORDER — HYDROCODONE-ACETAMINOPHEN 5-325 MG PO TABS: 1.0000 | ORAL_TABLET | Freq: Four times a day (QID) | ORAL | Status: DC | PRN

## 2014-08-09 MED ORDER — LIDOCAINE HCL (CARDIAC) 20 MG/ML IV SOLN
INTRAVENOUS | Status: DC | PRN
  Administered 2014-08-09: 50 mg via INTRAVENOUS

## 2014-08-09 MED ORDER — CHLORHEXIDINE GLUCONATE 4 % EX LIQD: 60.0000 mL | Freq: Once | CUTANEOUS | Status: DC

## 2014-08-09 MED ORDER — CEFAZOLIN SODIUM-DEXTROSE 2-3 GM-% IV SOLR
2.0000 g | INTRAVENOUS | Status: DC
  Administered 2014-08-09: 2 g via INTRAVENOUS

## 2014-08-09 MED ORDER — OXYCODONE HCL 5 MG/5ML PO SOLN: 5.0000 mg | Freq: Once | ORAL | Status: DC | PRN

## 2014-08-09 MED ORDER — FENTANYL CITRATE (PF) 100 MCG/2ML IJ SOLN
50.0000 ug | INTRAMUSCULAR | Status: DC | PRN
  Administered 2014-08-09: 25 ug via INTRAVENOUS

## 2014-08-09 MED ORDER — BUPIVACAINE HCL (PF) 0.25 % IJ SOLN
INTRAMUSCULAR | Status: DC | PRN
  Administered 2014-08-09: 5 mL

## 2014-08-09 MED ORDER — LACTATED RINGERS IV SOLN
INTRAVENOUS | Status: DC
  Administered 2014-08-09: 11:00:00 via INTRAVENOUS

## 2014-08-09 MED ORDER — ONDANSETRON HCL 4 MG/2ML IJ SOLN
INTRAMUSCULAR | Status: DC | PRN
  Administered 2014-08-09: 4 mg via INTRAVENOUS

## 2014-08-09 MED ORDER — LACTATED RINGERS IV SOLN: 500.0000 mL | INTRAVENOUS | Status: DC

## 2014-08-09 MED ORDER — CEFAZOLIN SODIUM-DEXTROSE 2-3 GM-% IV SOLR
INTRAVENOUS | Status: AC
  Filled 2014-08-09: qty 50

## 2014-08-09 MED ORDER — MIDAZOLAM HCL 2 MG/2ML IJ SOLN: 1.0000 mg | INTRAMUSCULAR | Status: DC | PRN

## 2014-08-09 MED ORDER — GLYCOPYRROLATE 0.2 MG/ML IJ SOLN: 0.2000 mg | Freq: Once | INTRAMUSCULAR | Status: DC | PRN

## 2014-08-09 MED ORDER — OXYCODONE HCL 5 MG PO TABS: 5.0000 mg | ORAL_TABLET | Freq: Once | ORAL | Status: DC | PRN

## 2014-08-09 MED ORDER — ONDANSETRON HCL 4 MG/2ML IJ SOLN: 4.0000 mg | Freq: Four times a day (QID) | INTRAMUSCULAR | Status: DC | PRN

## 2014-08-09 MED ORDER — FENTANYL CITRATE (PF) 100 MCG/2ML IJ SOLN: 25.0000 ug | INTRAMUSCULAR | Status: DC | PRN

## 2014-08-09 MED ORDER — SCOPOLAMINE 1 MG/3DAYS TD PT72: 1.0000 | MEDICATED_PATCH | Freq: Once | TRANSDERMAL | Status: DC | PRN

## 2014-08-09 MED ORDER — CEFAZOLIN SODIUM-DEXTROSE 2-3 GM-% IV SOLR: 2.0000 g | INTRAVENOUS | Status: DC

## 2014-08-09 MED ORDER — LIDOCAINE HCL (PF) 0.5 % IJ SOLN
INTRAMUSCULAR | Status: DC | PRN
  Administered 2014-08-09: 35 mL via INTRAVENOUS

## 2014-08-09 MED ORDER — PROPOFOL INFUSION 10 MG/ML OPTIME
INTRAVENOUS | Status: DC | PRN
  Administered 2014-08-09: 75 ug/kg/min via INTRAVENOUS

## 2014-08-09 MED ORDER — FENTANYL CITRATE (PF) 100 MCG/2ML IJ SOLN
INTRAMUSCULAR | Status: AC
  Filled 2014-08-09: qty 2

## 2014-08-09 SURGICAL SUPPLY — 34 items
BANDAGE COBAN STERILE 2 (GAUZE/BANDAGES/DRESSINGS) ×3 IMPLANT
BLADE SURG 15 STRL LF DISP TIS (BLADE) ×1 IMPLANT
BLADE SURG 15 STRL SS (BLADE) ×3
BNDG CMPR 9X4 STRL LF SNTH (GAUZE/BANDAGES/DRESSINGS)
BNDG ESMARK 4X9 LF (GAUZE/BANDAGES/DRESSINGS) IMPLANT
CHLORAPREP W/TINT 26ML (MISCELLANEOUS) ×3 IMPLANT
CORDS BIPOLAR (ELECTRODE) IMPLANT
COVER BACK TABLE 60X90IN (DRAPES) ×3 IMPLANT
COVER MAYO STAND STRL (DRAPES) ×3 IMPLANT
CUFF TOURNIQUET SINGLE 18IN (TOURNIQUET CUFF) ×2 IMPLANT
DECANTER SPIKE VIAL GLASS SM (MISCELLANEOUS) IMPLANT
DRAPE EXTREMITY T 121X128X90 (DRAPE) ×3 IMPLANT
DRAPE SURG 17X23 STRL (DRAPES) ×3 IMPLANT
GAUZE SPONGE 4X4 12PLY STRL (GAUZE/BANDAGES/DRESSINGS) ×3 IMPLANT
GAUZE XEROFORM 1X8 LF (GAUZE/BANDAGES/DRESSINGS) ×3 IMPLANT
GLOVE BIOGEL PI IND STRL 7.0 (GLOVE) ×2 IMPLANT
GLOVE BIOGEL PI IND STRL 8.5 (GLOVE) ×1 IMPLANT
GLOVE BIOGEL PI INDICATOR 7.0 (GLOVE) ×4
GLOVE BIOGEL PI INDICATOR 8.5 (GLOVE) ×2
GLOVE ECLIPSE 6.5 STRL STRAW (GLOVE) ×2 IMPLANT
GLOVE SURG ORTHO 8.0 STRL STRW (GLOVE) ×3 IMPLANT
GOWN STRL REUS W/ TWL LRG LVL3 (GOWN DISPOSABLE) ×1 IMPLANT
GOWN STRL REUS W/TWL LRG LVL3 (GOWN DISPOSABLE) ×3
GOWN STRL REUS W/TWL XL LVL3 (GOWN DISPOSABLE) ×3 IMPLANT
NDL PRECISIONGLIDE 27X1.5 (NEEDLE) ×1 IMPLANT
NEEDLE PRECISIONGLIDE 27X1.5 (NEEDLE) ×3 IMPLANT
NS IRRIG 1000ML POUR BTL (IV SOLUTION) ×3 IMPLANT
PACK BASIN DAY SURGERY FS (CUSTOM PROCEDURE TRAY) ×3 IMPLANT
STOCKINETTE 4X48 STRL (DRAPES) ×3 IMPLANT
SUT ETHILON 4 0 PS 2 18 (SUTURE) ×3 IMPLANT
SYR BULB 3OZ (MISCELLANEOUS) ×3 IMPLANT
SYR CONTROL 10ML LL (SYRINGE) ×3 IMPLANT
TOWEL OR 17X24 6PK STRL BLUE (TOWEL DISPOSABLE) ×6 IMPLANT
UNDERPAD 30X30 (UNDERPADS AND DIAPERS) ×3 IMPLANT

## 2014-08-09 NOTE — Op Note (Signed)
Dictation Number [id number]

## 2014-08-09 NOTE — Anesthesia Postprocedure Evaluation (Signed)
Anesthesia Post Note  Patient: Kimberly Greer  Procedure(s) Performed: Procedure(s) (LRB): RELEASE TRIGGER FINGER/A-1 PULLEY LEFT MIDDLE FINGER (Left)  Anesthesia type: MAC  Patient location: PACU  Post pain: Pain level controlled and Adequate analgesia  Post assessment: Post-op Vital signs reviewed, Patient's Cardiovascular Status Stable and Respiratory Function Stable  Last Vitals:  Filed Vitals:   08/09/14 1245  BP:   Pulse: 74  Temp:   Resp: 15    Post vital signs: Reviewed and stable  Level of consciousness: awake, alert  and oriented  Complications: No apparent anesthesia complications

## 2014-08-09 NOTE — Anesthesia Procedure Notes (Signed)
Procedure Name: MAC Date/Time: 08/09/2014 11:45 AM Performed by: Milanya Sunderland D Pre-anesthesia Checklist: Patient identified, Emergency Drugs available, Suction available, Patient being monitored and Timeout performed Patient Re-evaluated:Patient Re-evaluated prior to inductionOxygen Delivery Method: Simple face mask

## 2014-08-09 NOTE — Brief Op Note (Signed)
08/09/2014  12:11 PM  PATIENT:  Kimberly Greer  81 y.o. female  PRE-OPERATIVE DIAGNOSIS:  STENOSING TENOSYNOVITIS LEFT MIDDLE FINGER  POST-OPERATIVE DIAGNOSIS:  STENOSING TENOSYNOVITIS LEFT MIDDLE FINGER  PROCEDURE:  Procedure(s) with comments: RELEASE TRIGGER FINGER/A-1 PULLEY LEFT MIDDLE FINGER (Left) - REGIONAL/FAB  SURGEON:  Surgeon(s) and Role:    * Daryll Brod, MD - Primary  PHYSICIAN ASSISTANT:   ASSISTANTS: none   ANESTHESIA:   local and regional  EBL:  Total I/O In: 200 [I.V.:200] Out: -   BLOOD ADMINISTERED:none  DRAINS: none   LOCAL MEDICATIONS USED:  BUPIVICAINE   SPECIMEN:  No Specimen  DISPOSITION OF SPECIMEN:  N/A  COUNTS:  YES  TOURNIQUET:   Total Tourniquet Time Documented: Forearm (Left) - 13 minutes Total: Forearm (Left) - 13 minutes   DICTATION: .Other Dictation: Dictation Number [id number]  PLAN OF CARE: Discharge to home after PACU  PATIENT DISPOSITION:  PACU - hemodynamically stable.

## 2014-08-09 NOTE — Discharge Instructions (Addendum)

## 2014-08-09 NOTE — H&P (Signed)
Kimberly Greer is a 81 year old right hand dominant female referred by Dr. Jeanmarie Hubert for a consultation with respect to the inability to extend her left middle finger. This has been present for approximately 2 weeks. She recalls no history of injury. She states that she woke up with it one morning and the finger would not straighten. She has a history of thyroid and arthritis. She has no history of diabetes or gout. She complains of a moderate stabbing pain with a feeling of numbness getting worse. Activity makes it worse and rest makes it better. She has had an operation on her right side multiple years ago by Dr. Daylene Katayama. She does not remember exactly what was done.   PAST MEDICAL HISTORY:  She is allergic to Doxycycline, Avelox, sulfa, and Hista-tabs. She is on the following medications: Evista, Prilosec, Biaxin, Centrum, Flonase, Advair Diskus, Levothyroxine, Singulair, Myrbetiq, omeprazole. She has had a hysterectomy, right hand surgery 5 years ago.  FAMILY MEDICAL HISTORY: Positive for diabetes, otherwise negative.  SOCIAL HISTORY:  She does not smoke or use alcohol. She is widowed.  REVIEW OF SYSTEMS: Positive for glasses, hearing loss, asthma, balance problems, otherwise negative 14  LOUNELL Greer is an 81 y.o. female.   Chief Complaint: catching left middle finger HPI: see above  Past Medical History  Diagnosis Date  . Asthma   . Hypothyroid   . Senile osteoporosis 06/16/2010  . Other abnormal blood chemistry 06/19/2010    hyperglycemia  . Pain in limb 12/05/2009  . Other and unspecified hyperlipidemia 10/24/2009  . Syncope and collapse 05/30/2009  . Carpal tunnel syndrome 03/07/2009  . Sensorineural hearing loss, unspecified 02/21/2009  . Unspecified nasal polyp 02/21/2009  . Disturbance of skin sensation 02/21/2009  . Unspecified urinary incontinence 02/21/2009  . Diverticulosis of colon (without mention of hemorrhage) 02/16/2009  . 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 Other  atopic dermatitis and related conditions 02/16/2009  . Osteoarthrosis, unspecified whether generalized or localized, unspecified site 02/16/2009  . Otosclerosis, unspecified 02/22/2003    Past Surgical History  Procedure Laterality Date  . Breast biopsy  07/16/1990    benign left breast needle biopsy  Dr. Margot Chimes  . Abdominal hysterectomy  1999    Dr. Collier Bullock  . Cataract extraction w/ intraocular lens  implant, bilateral Bilateral   . Carpal tunnel release  04/07/2009    right Dr. Daylene Katayama    Family History  Problem Relation Age of Onset  . Cancer Mother     stomach  . Cancer Father     bone  . Dementia Sister   . Cancer Brother     pancreataic  . Cancer Son     pancreatic   Social History:  reports that she has never smoked. She has never used smokeless tobacco. She reports that she does not drink alcohol or use illicit drugs.  Allergies:  Allergies  Allergen Reactions  . Avelox [Moxifloxacin Hcl In Nacl] Itching  . Doxycycline   . Hista-Tabs [Triprolidine-Pse] Other (See Comments)    Passed out  . Septra [Sulfamethoxazole-Trimethoprim]   . Sulfa Antibiotics     Itching/rash (all over)    No prescriptions prior to admission    No results found for this or any previous visit (from the past 48 hour(s)).  No results found.   Pertinent items are noted in HPI.  Height 4\' 11"  (1.499 m), weight 62.596 kg (138 lb).  General appearance: alert, cooperative and appears stated age Head: Normocephalic, without obvious abnormality  Neck: no JVD Resp: clear to auscultation bilaterally Cardio: regular rate and rhythm, S1, S2 normal, no murmur, click, rub or gallop GI: soft, non-tender; bowel sounds normal; no masses,  no organomegaly Extremities: catching left middle finger Pulses: 2+ and symmetric Skin: Skin color, texture, turgor normal. No rashes or lesions Neurologic: Grossly normal Incision/Wound: na  Assessment/Plan X-rays reveal degenerative changes.  DIAGNOSIS:  (1) STS with Bouchard's and Heberden's nodes bilaterally. (2) CMC arthritis bilaterally.  This has been injected on 2 occasions without relief of symptoms. This continues to trigger for her. We discussed the possibility of surgical release of the A-1 pulley. Pre, peri and post op care are discussed along with risks and complications. Patient is aware there is no guarantee with surgery, possibility of infection, injury to arteries, nerves, and tendons, incomplete relief and dystrophy. She is scheduled for release A-1 pulley left middle finger as an outpatient under regional anesthesia.  Sheron Tallman R 08/09/2014, 9:15 AM

## 2014-08-09 NOTE — Transfer of Care (Signed)
Immediate Anesthesia Transfer of Care Note  Patient: Kimberly Greer  Procedure(s) Performed: Procedure(s) with comments: RELEASE TRIGGER FINGER/A-1 PULLEY LEFT MIDDLE FINGER (Left) - REGIONAL/FAB  Patient Location: PACU  Anesthesia Type:Bier block  Level of Consciousness: awake, alert , oriented and patient cooperative  Airway & Oxygen Therapy: Patient Spontanous Breathing and Patient connected to face mask oxygen  Post-op Assessment: Report given to RN and Post -op Vital signs reviewed and stable  Post vital signs: Reviewed and stable  Last Vitals:  Filed Vitals:   08/09/14 1107  BP: 186/60  Pulse: 78  Temp: 36.9 C  Resp: 20    Complications: No apparent anesthesia complications

## 2014-08-09 NOTE — Anesthesia Preprocedure Evaluation (Signed)
Anesthesia Evaluation  Patient identified by MRN, date of birth, ID band Patient awake    Reviewed: Allergy & Precautions, NPO status , Patient's Chart, lab work & pertinent test results  Airway Mallampati: II   Neck ROM: full    Dental   Pulmonary shortness of breath, asthma ,  breath sounds clear to auscultation        Cardiovascular negative cardio ROS  Rhythm:regular Rate:Normal     Neuro/Psych  Neuromuscular disease    GI/Hepatic   Endo/Other  Hypothyroidism   Renal/GU      Musculoskeletal  (+) Arthritis -,   Abdominal   Peds  Hematology   Anesthesia Other Findings   Reproductive/Obstetrics                             Anesthesia Physical Anesthesia Plan  ASA: II  Anesthesia Plan: MAC and Bier Block   Post-op Pain Management:    Induction: Intravenous  Airway Management Planned: Simple Face Mask  Additional Equipment:   Intra-op Plan:   Post-operative Plan:   Informed Consent: I have reviewed the patients History and Physical, chart, labs and discussed the procedure including the risks, benefits and alternatives for the proposed anesthesia with the patient or authorized representative who has indicated his/her understanding and acceptance.     Plan Discussed with: CRNA, Anesthesiologist and Surgeon  Anesthesia Plan Comments:         Anesthesia Quick Evaluation

## 2014-08-10 NOTE — Op Note (Signed)
Kimberly Greer, Kimberly Greer            ACCOUNT NO.:  [id number]  MEDICAL RECORD NO.:  [id number]  LOCATION:                                 FACILITY:  PHYSICIAN:  Daryll Brod, M.D.            DATE OF BIRTH:  DATE OF PROCEDURE:  08/09/2014 DATE OF DISCHARGE:                              OPERATIVE REPORT   PREOPERATIVE DIAGNOSIS:  Stenosing tenosynovitis, left middle finger.  POSTOPERATIVE DIAGNOSIS:  Stenosing tenosynovitis, left middle finger.  OPERATION:  Release A1 pulley, left middle finger.  SURGEON:  Daryll Brod, MD.  ANESTHESIA:  Forearm-based IV regional with local infiltration.  ANESTHESIOLOGIST:  Albertha Ghee, MD.  HISTORY:  The patient is a 81 year old female with a history of triggering of her left middle finger.  She does not have subluxation of the extensor tendon.  This has not responded to conservative treatment. She has elected to undergo release of the A1 pulley.  Pre, peri, and postoperative course have been discussed along with risks and complications.  She is aware that there is no guarantee with surgery, possibility of infection; recurrence of injury to arteries, nerves, tendons; incomplete relief of symptoms; and dystrophy.  In preoperative area, the patient was seen, the extremity marked by both patient and surgeon.  Antibiotic given.  PROCEDURE IN DETAIL:  The patient was brought to the operating room, where a forearm-based IV regional anesthetic was carried out without difficulty.  She was prepped using ChloraPrep, supine position, the left arm free.  A 3-minute dry time was allowed, time-out taken, confirming the patient and procedure.  An oblique incision was made over the A1 pulley of the left ring finger, carried down through subcutaneous tissue.  Neurovascular structures protected.  The A1 pulley was found to be thickened, and this was released on its radial aspect.  A small incision made centrally in A2.  A very significant tenosynovitis  was present proximally with adherence of the two flexor tendons, these were separated and partially debrided.  The finger was placed through a full range motion, no further triggering was noted.  The wound was copiously irrigated with saline and the skin closed with interrupted 4-0 nylon sutures.  Local infiltration with 0.25% bupivacaine without epinephrine was given, approximately 4 mL were used. Sterile compressive dressing was applied.  On deflation of the tourniquet, all fingers immediately pinked.  She was taken to the recovery room for observation in satisfactory condition.  She will be discharged home to return to the [facility] in 1 week on Norco.          ______________________________ Daryll Brod, M.D.     GK/MEDQ  D:  08/09/2014  T:  08/09/2014  Job:  [id number]

## 2014-08-11 ENCOUNTER — Encounter (HOSPITAL_BASED_OUTPATIENT_CLINIC_OR_DEPARTMENT_OTHER): Payer: Self-pay | Admitting: Orthopedic Surgery

## 2014-09-06 ENCOUNTER — Non-Acute Institutional Stay: Payer: Medicare Other | Admitting: Internal Medicine

## 2014-09-06 ENCOUNTER — Encounter: Payer: Self-pay | Admitting: Internal Medicine

## 2014-09-06 VITALS — BP 130/80 | HR 57 | Temp 97.7°F | Resp 20 | Ht 59.0 in | Wt 141.5 lb

## 2014-09-06 DIAGNOSIS — R269 Unspecified abnormalities of gait and mobility: Secondary | ICD-10-CM

## 2014-09-06 DIAGNOSIS — M6530 Trigger finger, unspecified finger: Secondary | ICD-10-CM

## 2014-09-06 DIAGNOSIS — J4520 Mild intermittent asthma, uncomplicated: Secondary | ICD-10-CM

## 2014-09-06 NOTE — Progress Notes (Signed)
Patient ID: Kimberly Greer, female   DOB: Jul 24, 1921, 81 y.o.   MRN: [id number]    [facility]    Place of Service: Clinic (12)     Allergies  Allergen Reactions  . Avelox [Moxifloxacin Hcl In Nacl] Itching  . Doxycycline   . Hista-Tabs [Triprolidine-Pse] Other (See Comments)    Passed out  . Septra [Sulfamethoxazole-Trimethoprim]   . Sulfa Antibiotics     Itching/rash (all over)    Chief Complaint  Patient presents with  . Medical Management of Chronic Issues  . Hospitalization Follow-up    left hand,middle finger    HPI:  Trigger finger, acquired - excellent postsurgical result for release of tendon contracture of the right fourth finger. Painless area that is well-healed. Surgery took place in early July 2016.  Abnormality of gait - using cane for stability. No recent falls.  Intrinsic asthma, mild intermittent, uncomplicated - short of breath with exertion. Also may start wheeze. She has regular about using her inhalers.    Medications: Patient's Medications  New Prescriptions   No medications on file  Previous Medications   CALCIUM-VITAMIN D-VITAMIN K (VIACTIV) 500-500-40 MG-UNT-MCG CHEW    Chew 1 tablet by mouth daily. Take 1 tablet daily for calcium.   FLUTICASONE (FLONASE) 50 MCG/ACT NASAL SPRAY    Shake well before each use and instill one to two sprays in each nostril once daily to control congestion   FLUTICASONE-SALMETEROL (ADVAIR DISKUS) 250-50 MCG/DOSE AEPB    Inhale once daily to help breathing   HYDROCODONE-ACETAMINOPHEN (NORCO) 5-325 MG PER TABLET    Take 1 tablet by mouth every 6 (six) hours as needed for moderate pain.   IBUPROFEN (ADVIL,MOTRIN) 200 MG TABLET    Take 200 mg by mouth. One tablet daily as  needed for pain   LEVOTHYROXINE (SYNTHROID, LEVOTHROID) 50 MCG TABLET    Take one tablet by mouth once daily for thyroid   MONTELUKAST (SINGULAIR) 10 MG TABLET    Take one tablet by mouth once daily to help breathing   MULTIPLE  VITAMINS-MINERALS (MULTIVITAMIN WITH MINERALS) TABLET    Take 1 tablet by mouth daily. Take 1 tablet as a vitamin supplement.   MYRBETRIQ 25 MG TB24 TABLET    TAKE ONE TABLET BY MOUTH ONCE DAILY   OMEPRAZOLE (PRILOSEC) 20 MG CAPSULE    Take 20 mg by mouth daily. Take 1 tablet daily for heartburn or stomach reflux.   RALOXIFENE (EVISTA) 60 MG TABLET    One daily to treat osteoporosis  Modified Medications   No medications on file  Discontinued Medications   No medications on file     Review of Systems  Constitutional: Positive for fatigue. Negative for fever, activity change and unexpected weight change.  HENT: Positive for congestion, hearing loss, postnasal drip and rhinorrhea. Negative for nosebleeds.   Eyes: Positive for visual disturbance (corrective lenses).  Respiratory: Positive for shortness of breath and wheezing. Negative for cough, choking, chest tightness and stridor.   Cardiovascular: Negative for chest pain, palpitations and leg swelling.  Endocrine: Negative for cold intolerance, heat intolerance, polydipsia, polyphagia and polyuria.  Genitourinary: Positive for decreased urine volume. Negative for dysuria, frequency, flank pain and difficulty urinating.  Musculoskeletal: Positive for gait problem. Negative for myalgias and joint swelling.       Scar right palm from release of 4th finger contracture  Skin: Negative.   Neurological: Negative for dizziness, tremors, seizures and headaches.  Hematological: Negative.   Psychiatric/Behavioral: Negative.  Filed Vitals:   09/06/14 1044  BP: 130/80  Pulse: 57  Temp: 97.7 F (36.5 C)  TempSrc: Oral  Resp: 20  Height: 4\' 11"  (1.499 m)  Weight: 141 lb 8 oz (64.184 kg)  SpO2: 96%   Body mass index is 28.56 kg/(m^2).  Physical Exam  Constitutional: She is oriented to person, place, and time. She appears well-developed and well-nourished. No distress.  HENT:  Head: Normocephalic and atraumatic.  Right Ear: External  ear normal.  Left Ear: External ear normal.  Nose: Nose normal.  Mouth/Throat: Oropharynx is clear and moist.  Mild hearing deficit  Eyes:  Corrective lenses  Neck: No JVD present. No tracheal deviation present. No thyromegaly present.  Cardiovascular: Normal rate, regular rhythm, normal heart sounds and intact distal pulses.  Exam reveals no gallop and no friction rub.   No murmur heard. Pulmonary/Chest: No respiratory distress. She has no wheezes. She has rales. She exhibits no tenderness.  Rales and wheeze  Abdominal: She exhibits no distension and no mass. There is no tenderness.  Musculoskeletal: Normal range of motion. She exhibits no edema or tenderness.  Unstable gait. Using cane. Right 4th finger contracture has been released.  Lymphadenopathy:    She has no cervical adenopathy.  Neurological: She is alert and oriented to person, place, and time. No cranial nerve deficit. Coordination normal.  10/27/12 MMSE 28/30. Passed clock drawing.  Skin: No rash noted. No erythema. No pallor.  Psychiatric: She has a normal mood and affect. Her behavior is normal. Judgment and thought content normal.     Labs reviewed: Admission on 08/09/2014, Discharged on 08/09/2014  Component Date Value Ref Range Status  . Hemoglobin 08/09/2014 14.0  12.0 - 15.0 g/dL Final     Assessment/Plan  1. Trigger finger, acquired Excellent Postsurgical result with some contracture  2. Abnormality of gait Continue use cane or walker  3. Intrinsic asthma, mild intermittent, uncomplicated Continue use current medications

## 2014-09-23 ENCOUNTER — Other Ambulatory Visit: Payer: Self-pay | Admitting: Internal Medicine

## 2014-11-03 DIAGNOSIS — Z23 Encounter for immunization: Secondary | ICD-10-CM | POA: Diagnosis not present

## 2014-11-06 ENCOUNTER — Emergency Department (HOSPITAL_COMMUNITY): Payer: Medicare Other

## 2014-11-06 ENCOUNTER — Encounter (HOSPITAL_COMMUNITY): Payer: Self-pay | Admitting: Emergency Medicine

## 2014-11-06 ENCOUNTER — Emergency Department (HOSPITAL_COMMUNITY)
Admission: EM | Admit: 2014-11-06 | Discharge: 2014-11-06 | Disposition: A | Discharge status: Home / Self Care | Payer: Medicare Other | Attending: Emergency Medicine | Admitting: Emergency Medicine

## 2014-11-06 DIAGNOSIS — Z79899 Other long term (current) drug therapy: Secondary | ICD-10-CM | POA: Diagnosis not present

## 2014-11-06 DIAGNOSIS — Z8719 Personal history of other diseases of the digestive system: Secondary | ICD-10-CM | POA: Insufficient documentation

## 2014-11-06 DIAGNOSIS — S42292A Other displaced fracture of upper end of left humerus, initial encounter for closed fracture: Secondary | ICD-10-CM | POA: Diagnosis not present

## 2014-11-06 DIAGNOSIS — Z872 Personal history of diseases of the skin and subcutaneous tissue: Secondary | ICD-10-CM | POA: Diagnosis not present

## 2014-11-06 DIAGNOSIS — Y998 Other external cause status: Secondary | ICD-10-CM | POA: Diagnosis not present

## 2014-11-06 DIAGNOSIS — W01198A Fall on same level from slipping, tripping and stumbling with subsequent striking against other object, initial encounter: Secondary | ICD-10-CM | POA: Diagnosis not present

## 2014-11-06 DIAGNOSIS — Y9289 Other specified places as the place of occurrence of the external cause: Secondary | ICD-10-CM | POA: Diagnosis not present

## 2014-11-06 DIAGNOSIS — E785 Hyperlipidemia, unspecified: Secondary | ICD-10-CM | POA: Diagnosis not present

## 2014-11-06 DIAGNOSIS — Y9389 Activity, other specified: Secondary | ICD-10-CM | POA: Insufficient documentation

## 2014-11-06 DIAGNOSIS — Z7951 Long term (current) use of inhaled steroids: Secondary | ICD-10-CM | POA: Diagnosis not present

## 2014-11-06 DIAGNOSIS — R52 Pain, unspecified: Secondary | ICD-10-CM

## 2014-11-06 DIAGNOSIS — E039 Hypothyroidism, unspecified: Secondary | ICD-10-CM | POA: Insufficient documentation

## 2014-11-06 DIAGNOSIS — R259 Unspecified abnormal involuntary movements: Secondary | ICD-10-CM | POA: Diagnosis not present

## 2014-11-06 DIAGNOSIS — S0990XA Unspecified injury of head, initial encounter: Secondary | ICD-10-CM | POA: Diagnosis not present

## 2014-11-06 DIAGNOSIS — S4992XA Unspecified injury of left shoulder and upper arm, initial encounter: Secondary | ICD-10-CM | POA: Diagnosis present

## 2014-11-06 DIAGNOSIS — S43005A Unspecified dislocation of left shoulder joint, initial encounter: Secondary | ICD-10-CM | POA: Diagnosis not present

## 2014-11-06 DIAGNOSIS — Z8669 Personal history of other diseases of the nervous system and sense organs: Secondary | ICD-10-CM | POA: Diagnosis not present

## 2014-11-06 DIAGNOSIS — M810 Age-related osteoporosis without current pathological fracture: Secondary | ICD-10-CM | POA: Insufficient documentation

## 2014-11-06 DIAGNOSIS — S199XXA Unspecified injury of neck, initial encounter: Secondary | ICD-10-CM | POA: Diagnosis not present

## 2014-11-06 DIAGNOSIS — J45909 Unspecified asthma, uncomplicated: Secondary | ICD-10-CM | POA: Insufficient documentation

## 2014-11-06 DIAGNOSIS — Z8742 Personal history of other diseases of the female genital tract: Secondary | ICD-10-CM | POA: Diagnosis not present

## 2014-11-06 DIAGNOSIS — M25512 Pain in left shoulder: Secondary | ICD-10-CM | POA: Diagnosis not present

## 2014-11-06 DIAGNOSIS — S42212A Unspecified displaced fracture of surgical neck of left humerus, initial encounter for closed fracture: Secondary | ICD-10-CM | POA: Diagnosis not present

## 2014-11-06 DIAGNOSIS — W19XXXA Unspecified fall, initial encounter: Secondary | ICD-10-CM

## 2014-11-06 DIAGNOSIS — T148 Other injury of unspecified body region: Secondary | ICD-10-CM | POA: Diagnosis not present

## 2014-11-06 MED ORDER — ACETAMINOPHEN 325 MG PO TABS
650.0000 mg | ORAL_TABLET | Freq: Once | ORAL | Status: AC
  Administered 2014-11-06: 650 mg via ORAL
  Filled 2014-11-06: qty 2

## 2014-11-06 MED ORDER — ACETAMINOPHEN 325 MG PO TABS: 650.0000 mg | ORAL_TABLET | Freq: Four times a day (QID) | ORAL | Status: DC | PRN

## 2014-11-06 NOTE — ED Notes (Signed)
Patient undressed, in gown, on monitor, continuous pulse oximetry and blood pressure cuff 

## 2014-11-06 NOTE — Discharge Instructions (Signed)
°  Please call the listed orthopedic surgeon for follow-up in one week. He can take Tylenol or Motrin as needed for pain control. Return without fail for worsening symptoms, including worsening pain, new numbness or weakness, or any other symptoms concerning to you.  Shoulder Fracture You have a fractured humerus (bone in the upper arm) at the shoulder just below the ball of the shoulder joint. Most of the time the bones of a broken shoulder are in an acceptable position. Usually the injury can be treated with a shoulder immobilizer or sling and swath bandage. These devices support the arm and prevent any shoulder movement. If the bones are not in a good position, then surgery is sometimes needed. Shoulder fractures usually cause swelling, pain, and discoloration around the upper arm initially. They heal in 8-12 weeks with proper treatment. Rest in bed or a reclining chair as long as your shoulder is very painful. Sitting up generally results in less pain at the fracture site. Do not remove your shoulder bandage until your caregiver approves. You may apply ice packs over the shoulder for 20-30 minutes every 2 hours for the next 2-3 days to reduce the pain and swelling. Use your pain medicine as prescribed.  SEEK IMMEDIATE MEDICAL CARE IF:  You develop severe shoulder pain unrelieved by rest and taking pain medicine.  You have pain, numbness, tingling, or weakness in the hand or wrist.  You develop shortness of breath, chest pain, severe weakness, or fainting.  You have severe pain with motion of the fingers or wrist. MAKE SURE YOU:   Understand these instructions.  Will watch your condition.  Will get help right away if you are not doing well or get worse. Document Released: 02/29/2004 Document Revised: 04/15/2011 Document Reviewed: 05/11/2008 [facility] Patient Information 2015 [place], [place]. This information is not intended to replace advice given to you by your health care provider. Make sure  you discuss any questions you have with your health care provider.

## 2014-11-06 NOTE — ED Notes (Signed)
Patient returned from Radiology. 

## 2014-11-06 NOTE — ED Provider Notes (Signed)
CSN: [id number]     Arrival date & time 11/06/14  1557 History   First MD Initiated Contact with Patient 11/06/14 [phone]     Chief Complaint  Patient presents with  . Fall  . Arm Injury     (Consider location/radiation/quality/duration/timing/severity/associated sxs/prior Treatment) HPI 81 year old female who presents after fall. She is otherwise been in her usual state of health, and was ambulating with her walker today. She lost her balance and fell on her left side hitting her head but did not have loss of consciousness. Denies neck pain, back pain, chest pain, difficulty breathing, or abdominal pain. Does not take any anticoagulation and denies any preceding syncope or near syncope. Was not able to get up due to her left shoulder pain.  Past Medical History  Diagnosis Date  . Asthma   . Hypothyroid   . Senile osteoporosis 06/16/2010  . Other abnormal blood chemistry 06/19/2010    hyperglycemia  . Pain in limb 12/05/2009  . Other and unspecified hyperlipidemia 10/24/2009  . Syncope and collapse 05/30/2009  . Carpal tunnel syndrome 03/07/2009  . Sensorineural hearing loss, unspecified 02/21/2009  . Unspecified nasal polyp 02/21/2009  . Disturbance of skin sensation 02/21/2009  . Unspecified urinary incontinence 02/21/2009  . Diverticulosis of colon (without mention of hemorrhage) 02/16/2009  . 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 Other atopic dermatitis and related conditions 02/16/2009  . Osteoarthrosis, unspecified whether generalized or localized, unspecified site 02/16/2009  . Otosclerosis, unspecified 02/22/2003   Past Surgical History  Procedure Laterality Date  . Breast biopsy  07/16/1990    benign left breast needle biopsy  Dr. Margot Chimes  . Abdominal hysterectomy  1999    Dr. Collier Bullock  . Cataract extraction w/ intraocular lens  implant, bilateral Bilateral   . Carpal tunnel release  04/07/2009    right Dr. Daylene Katayama  . Trigger finger release Left 08/09/2014    Procedure: RELEASE TRIGGER  FINGER/A-1 PULLEY LEFT MIDDLE FINGER;  Surgeon: Daryll Brod, MD;  Location: [place];  Service: Orthopedics;  Laterality: Left;  REGIONAL/FAB   Family History  Problem Relation Age of Onset  . Cancer Mother     stomach  . Cancer Father     bone  . Dementia Sister   . Cancer Brother     pancreataic  . Cancer Son     pancreatic   Social History  Substance Use Topics  . Smoking status: Never Smoker   . Smokeless tobacco: Never Used  . Alcohol Use: No   OB History    No data available     Review of Systems 10/14 systems reviewed and are negative other than those stated in the HPI    Allergies  Avelox; Doxycycline; Hista-tabs; Septra; and Sulfa antibiotics  Home Medications   Prior to Admission medications   Medication Sig Start Date End Date Taking? Authorizing Provider  Calcium-Vitamin D-Vitamin K (VIACTIV) 035-465-68 MG-UNT-MCG CHEW Chew 1 tablet by mouth daily. Take 1 tablet daily for calcium.   Yes Historical Provider, MD  fluticasone (FLONASE) 50 MCG/ACT nasal spray Shake well before each use and instill one to two sprays in each nostril once daily to control congestion 06/06/14  Yes [name], MD  Fluticasone-Salmeterol (ADVAIR DISKUS) 250-50 MCG/DOSE AEPB Inhale once daily to help breathing 06/06/14  Yes [name], MD  levothyroxine (SYNTHROID, LEVOTHROID) 50 MCG tablet Take one tablet by mouth once daily for thyroid 06/07/14  Yes [name], MD  montelukast (SINGULAIR) 10 MG  tablet Take one tablet by mouth once daily to help breathing 06/07/14  Yes [name], MD  Multiple Vitamins-Minerals (MULTIVITAMIN WITH MINERALS) tablet Take 1 tablet by mouth daily. Take 1 tablet as a vitamin supplement.   Yes Historical Provider, MD  MYRBETRIQ 25 MG TB24 tablet TAKE ONE TABLET BY MOUTH ONCE DAILY 03/30/14  Yes [name], MD  omeprazole (PRILOSEC) 20 MG capsule Take 20 mg by mouth daily. Take 1 tablet daily for heartburn or stomach reflux.   Yes  Historical Provider, MD  raloxifene (EVISTA) 60 MG tablet Take 1 tablet by mouth  daily to treat osteoporosis 09/23/14  Yes [name], MD  acetaminophen (TYLENOL) 325 MG tablet Take 2 tablets (650 mg total) by mouth every 6 (six) hours as needed for mild pain or moderate pain. 11/06/14   Forde Dandy, MD   BP 136/59 mmHg  Pulse 74  Temp(Src) 98.1 F (36.7 C) (Oral)  Resp 18  SpO2 99% Physical Exam Physical Exam  Nursing note and vitals reviewed. Constitutional: Well developed, well nourished, non-toxic, and in no acute distress Head: Normocephalic and atraumatic.  Mouth/Throat: Oropharynx is clear and moist.  Neck: Normal range of motion. Neck supple. No cervical spine tenderness. Cardiovascular: Normal rate and regular rhythm.  +2 radial pulses bilaterally Pulmonary/Chest: Effort normal and breath sounds normal. No chest wall pain Abdominal: Soft. There is no tenderness. There is no rebound and no guarding.  Musculoskeletal: Limited ROM of left shoulder 2/2 pain, tenderness to palpation of prox humerus, mild swelling. No TLS spine tenderness. Neurological: Alert, no facial droop, fluent speech, , in tact sensation involving the axillary nerve, in tact motor/sensation of radial, ulnar, and median nerves. Skin: Skin is warm and dry.  Psychiatric: Cooperative  ED Course  Procedures (including critical care time) Labs Review Labs Reviewed - No data to display  Imaging Review Ct Head Wo Contrast  11/06/2014   CLINICAL DATA:  81 year old female with acute head and neck injury following fall.  EXAM: CT HEAD WITHOUT CONTRAST  CT CERVICAL SPINE WITHOUT CONTRAST  TECHNIQUE: Multidetector CT imaging of the head and cervical spine was performed following the standard protocol without intravenous contrast. Multiplanar CT image reconstructions of the cervical spine were also generated.  COMPARISON:  04/25/2011 and prior CTs  FINDINGS: CT HEAD FINDINGS  Chronic small-vessel white matter ischemic  changes are again noted.  No acute intracranial abnormalities are identified, including mass lesion or mass effect, hydrocephalus, extra-axial fluid collection, midline shift, hemorrhage, or acute infarction.  Mucosal thickening and near complete opacification of majority of the paranasal sinuses again noted. Fluid in the left sphenoid sinus is present. The visualized bony calvarium is unremarkable.  CT CERVICAL SPINE FINDINGS  Normal alignment is noted.  There is no evidence of acute fracture, subluxation or prevertebral soft tissue swelling.  Mild multilevel degenerative disc disease, spondylosis and facet arthropathy throughout the cervical spine noted.  The lung apices are clear.  Enlarged right thyroid gland is unchanged.  IMPRESSION: No evidence of acute intracranial abnormality. Chronic small-vessel white matter ischemic changes.  No static evidence of acute injury to the cervical spine. Mild multilevel degenerative changes again noted.  Paranasal sinusitis again identified.   Electronically Signed   By: Margarette Canada M.D.   On: 11/06/2014 17:26   Ct Cervical Spine Wo Contrast  11/06/2014   CLINICAL DATA:  81 year old female with acute head and neck injury following fall.  EXAM: CT HEAD WITHOUT CONTRAST  CT CERVICAL SPINE  WITHOUT CONTRAST  TECHNIQUE: Multidetector CT imaging of the head and cervical spine was performed following the standard protocol without intravenous contrast. Multiplanar CT image reconstructions of the cervical spine were also generated.  COMPARISON:  04/25/2011 and prior CTs  FINDINGS: CT HEAD FINDINGS  Chronic small-vessel white matter ischemic changes are again noted.  No acute intracranial abnormalities are identified, including mass lesion or mass effect, hydrocephalus, extra-axial fluid collection, midline shift, hemorrhage, or acute infarction.  Mucosal thickening and near complete opacification of majority of the paranasal sinuses again noted. Fluid in the left sphenoid sinus is  present. The visualized bony calvarium is unremarkable.  CT CERVICAL SPINE FINDINGS  Normal alignment is noted.  There is no evidence of acute fracture, subluxation or prevertebral soft tissue swelling.  Mild multilevel degenerative disc disease, spondylosis and facet arthropathy throughout the cervical spine noted.  The lung apices are clear.  Enlarged right thyroid gland is unchanged.  IMPRESSION: No evidence of acute intracranial abnormality. Chronic small-vessel white matter ischemic changes.  No static evidence of acute injury to the cervical spine. Mild multilevel degenerative changes again noted.  Paranasal sinusitis again identified.   Electronically Signed   By: Margarette Canada M.D.   On: 11/06/2014 17:26   Dg Shoulder Left  11/06/2014   CLINICAL DATA:  Initial encounter for fall with left shoulder pain.  EXAM: LEFT SHOULDER - 2+ VIEW  COMPARISON:  None.  FINDINGS: Visualized portion of the left hemithorax is normal. Comminuted fracture involving the humeral head/ neck junction. Intra-articular extension. No dislocation.  IMPRESSION: Comminuted intra-articular humeral head/neck fracture.   Electronically Signed   By: Abigail Miyamoto M.D.   On: 11/06/2014 17:44   Dg Shoulder Left Port  11/06/2014   CLINICAL DATA:  Pain.  EXAM: LEFT SHOULDER - 1 VIEW  COMPARISON:  None.  FINDINGS: Comminuted fracture of the surgical neck of the left proximal humerus. No dislocation.  IMPRESSION: Comminuted fracture of the surgical neck of the left proximal humerus. No dislocation.   Electronically Signed   By: Kathreen Devoid   On: 11/06/2014 19:29   I have personally reviewed and evaluated these images and lab results as part of my medical decision-making.   EKG Interpretation   Date/Time:  Sunday November 06 2014 16:22:38 EDT Ventricular Rate:  77 PR Interval:  167 QRS Duration: 124 QT Interval:  393 QTC Calculation: 445 R Axis:   -49 Text Interpretation:  Sinus rhythm Left bundle branch block No significant   change since last tracing Confirmed by Buffi Ewton MD, Hinton Dyer ([id number]) on 11/06/2014  4:40:03 PM      MDM   Final diagnoses:  Fall, initial encounter  Humeral head fracture, left, closed, initial encounter   In short, this is a 81 year old female, not anticoagulated, who presents with a mechanical fall and left shoulder pain. She is well-appearing and in no acute distress. Vital signs are not concerning. She on exam has a neurovascularly intact left upper extremity, but with pain with range of motion of the left shoulder and some mild swelling and tenderness of the proximal humerus. No other evidence of acute injuries, but given fall with head strike a CT head and cervical spine also performed. This is negative for any acute intracranial or cervical spine traumatic injuries. X-ray of the left shoulder reveals comminuted humeral head fracture that is closed. No evidence of dislocation. Sling is placed and she will follow-up with Dr. Erlinda Hong from [place] in one week.    Forde Dandy,  MD 11/06/14 1946

## 2014-11-06 NOTE — ED Notes (Signed)
[facility] contacted to transport patient to [facility]

## 2014-11-06 NOTE — ED Notes (Signed)
Given 150mcg Fentanyl intranasally.

## 2014-11-06 NOTE — ED Notes (Signed)
Walking with walker; lost footing and fell. Landed on carpet on left arm. Injury to left arm.

## 2014-11-14 DIAGNOSIS — S42292A Other displaced fracture of upper end of left humerus, initial encounter for closed fracture: Secondary | ICD-10-CM | POA: Diagnosis not present

## 2014-11-18 ENCOUNTER — Non-Acute Institutional Stay (SKILLED_NURSING_FACILITY): Payer: Medicare Other | Admitting: Nurse Practitioner

## 2014-11-18 DIAGNOSIS — M6281 Muscle weakness (generalized): Secondary | ICD-10-CM | POA: Diagnosis not present

## 2014-11-18 DIAGNOSIS — N318 Other neuromuscular dysfunction of bladder: Secondary | ICD-10-CM | POA: Diagnosis not present

## 2014-11-18 DIAGNOSIS — R413 Other amnesia: Secondary | ICD-10-CM

## 2014-11-18 DIAGNOSIS — M810 Age-related osteoporosis without current pathological fracture: Secondary | ICD-10-CM | POA: Diagnosis not present

## 2014-11-18 DIAGNOSIS — R29898 Other symptoms and signs involving the musculoskeletal system: Secondary | ICD-10-CM | POA: Diagnosis not present

## 2014-11-18 DIAGNOSIS — S4292XS Fracture of left shoulder girdle, part unspecified, sequela: Secondary | ICD-10-CM | POA: Diagnosis not present

## 2014-11-18 DIAGNOSIS — E039 Hypothyroidism, unspecified: Secondary | ICD-10-CM | POA: Diagnosis not present

## 2014-11-18 DIAGNOSIS — K219 Gastro-esophageal reflux disease without esophagitis: Secondary | ICD-10-CM

## 2014-11-18 DIAGNOSIS — S42202A Unspecified fracture of upper end of left humerus, initial encounter for closed fracture: Secondary | ICD-10-CM

## 2014-11-18 DIAGNOSIS — R296 Repeated falls: Secondary | ICD-10-CM | POA: Diagnosis not present

## 2014-11-18 DIAGNOSIS — R2681 Unsteadiness on feet: Secondary | ICD-10-CM | POA: Diagnosis not present

## 2014-11-18 DIAGNOSIS — J4520 Mild intermittent asthma, uncomplicated: Secondary | ICD-10-CM | POA: Diagnosis not present

## 2014-11-18 HISTORY — DX: Unspecified fracture of upper end of left humerus, initial encounter for closed fracture: S42.202A

## 2014-11-21 DIAGNOSIS — R2681 Unsteadiness on feet: Secondary | ICD-10-CM | POA: Diagnosis not present

## 2014-11-21 DIAGNOSIS — R29898 Other symptoms and signs involving the musculoskeletal system: Secondary | ICD-10-CM | POA: Diagnosis not present

## 2014-11-21 DIAGNOSIS — M6281 Muscle weakness (generalized): Secondary | ICD-10-CM | POA: Diagnosis not present

## 2014-11-21 DIAGNOSIS — R296 Repeated falls: Secondary | ICD-10-CM | POA: Diagnosis not present

## 2014-11-22 DIAGNOSIS — R296 Repeated falls: Secondary | ICD-10-CM | POA: Diagnosis not present

## 2014-11-22 DIAGNOSIS — R29898 Other symptoms and signs involving the musculoskeletal system: Secondary | ICD-10-CM | POA: Diagnosis not present

## 2014-11-22 DIAGNOSIS — R2681 Unsteadiness on feet: Secondary | ICD-10-CM | POA: Diagnosis not present

## 2014-11-22 DIAGNOSIS — M6281 Muscle weakness (generalized): Secondary | ICD-10-CM | POA: Diagnosis not present

## 2014-11-24 DIAGNOSIS — R296 Repeated falls: Secondary | ICD-10-CM | POA: Diagnosis not present

## 2014-11-24 DIAGNOSIS — M6281 Muscle weakness (generalized): Secondary | ICD-10-CM | POA: Diagnosis not present

## 2014-11-24 DIAGNOSIS — R29898 Other symptoms and signs involving the musculoskeletal system: Secondary | ICD-10-CM | POA: Diagnosis not present

## 2014-11-24 DIAGNOSIS — R2681 Unsteadiness on feet: Secondary | ICD-10-CM | POA: Diagnosis not present

## 2014-11-25 ENCOUNTER — Encounter: Payer: Self-pay | Admitting: Nurse Practitioner

## 2014-11-25 DIAGNOSIS — M810 Age-related osteoporosis without current pathological fracture: Secondary | ICD-10-CM | POA: Insufficient documentation

## 2014-11-25 DIAGNOSIS — R2681 Unsteadiness on feet: Secondary | ICD-10-CM | POA: Diagnosis not present

## 2014-11-25 DIAGNOSIS — S4292XA Fracture of left shoulder girdle, part unspecified, initial encounter for closed fracture: Secondary | ICD-10-CM | POA: Insufficient documentation

## 2014-11-25 DIAGNOSIS — M6281 Muscle weakness (generalized): Secondary | ICD-10-CM | POA: Diagnosis not present

## 2014-11-25 DIAGNOSIS — R296 Repeated falls: Secondary | ICD-10-CM | POA: Diagnosis not present

## 2014-11-25 DIAGNOSIS — K219 Gastro-esophageal reflux disease without esophagitis: Secondary | ICD-10-CM | POA: Insufficient documentation

## 2014-11-25 DIAGNOSIS — R29898 Other symptoms and signs involving the musculoskeletal system: Secondary | ICD-10-CM | POA: Diagnosis not present

## 2014-11-25 NOTE — Assessment & Plan Note (Addendum)
11/06/14 X-ray left shoulder  IMPRESSION: Comminuted intra-articular humeral head/neck fracture. Continue sling, f/u Ortho Obtain CBC and CMP

## 2014-11-25 NOTE — Assessment & Plan Note (Addendum)
Last TSH 0.55 02/14/14, continue Synthroid 42mcg. Update TSH

## 2014-11-25 NOTE — Assessment & Plan Note (Signed)
Managed with Myrbetriq.  

## 2014-11-25 NOTE — Assessment & Plan Note (Signed)
Stable, continue Flonase, Advair

## 2014-11-25 NOTE — Progress Notes (Signed)
Patient ID: Kimberly Greer, female   DOB: Jun 15, 1921, 81 y.o.   MRN: [id number]  Location:  SNF [facility] Provider:  Marlana Latus NP  Code Status:  DNR Goals of care: Advanced Directive information    Chief Complaint  Patient presents with  . Medical Management of Chronic Issues     HPI: Patient is a 81 y.o. female seen in the SNF at [facility] today for evaluation of her chronic medical conditions. Admitted to SNF due to inadequate self care sufficiency in [place] after fx of the left shoulder.   Review of Systems:  Review of Systems  Constitutional: Negative for fever.  HENT: Positive for congestion and hearing loss. Negative for nosebleeds.   Respiratory: Positive for shortness of breath and wheezing. Negative for cough and stridor.   Cardiovascular: Negative for chest pain, palpitations and leg swelling.  Genitourinary: Negative for dysuria, frequency and flank pain.  Musculoskeletal: Positive for joint pain. Negative for myalgias.       Scar right palm from release of 4th finger contracture Left shoulder fx 11/06/14, in sling, f/u Ortho, pain with movement  Skin: Negative.   Neurological: Negative for dizziness, tremors, seizures and headaches.  Endo/Heme/Allergies: Negative for polydipsia.  Psychiatric/Behavioral: Negative.     Past Medical History  Diagnosis Date  . Asthma   . Hypothyroid   . Senile osteoporosis 06/16/2010  . Other abnormal blood chemistry 06/19/2010    hyperglycemia  . Pain in limb 12/05/2009  . Other and unspecified hyperlipidemia 10/24/2009  . Syncope and collapse 05/30/2009  . Carpal tunnel syndrome 03/07/2009  . Sensorineural hearing loss, unspecified 02/21/2009  . Unspecified nasal polyp 02/21/2009  . Disturbance of skin sensation 02/21/2009  . Unspecified urinary incontinence 02/21/2009  . Diverticulosis of colon (without mention of hemorrhage) 02/16/2009  . 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 Other atopic dermatitis and related conditions 02/16/2009    . Osteoarthrosis, unspecified whether generalized or localized, unspecified site 02/16/2009  . Otosclerosis, unspecified 02/22/2003    Patient Active Problem List   Diagnosis Date Noted  . GERD (gastroesophageal reflux disease) 11/25/2014  . Osteoporosis 11/25/2014  . Fracture of left shoulder 11/25/2014  . Abnormality of gait 03/08/2014  . Trigger finger, acquired 03/08/2014  . Dyspnea 02/09/2013  . Intrinsic asthma 02/09/2013  . Memory loss 06/23/2012  . Hypothyroid   . Hyperglycemia 06/19/2010  . Hyperlipemia 10/24/2009  . Sensorineural hearing loss, unspecified 02/21/2009  . Urinary incontinence 02/21/2009  . 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 Osteoarthritis 02/16/2009    Allergies  Allergen Reactions  . Avelox [Moxifloxacin Hcl In Nacl] Itching  . Doxycycline   . Hista-Tabs [Triprolidine-Pse] Other (See Comments)    Passed out  . Septra [Sulfamethoxazole-Trimethoprim]   . Sulfa Antibiotics     Itching/rash (all over)    Medications: Patient's Medications  New Prescriptions   No medications on file  Previous Medications   ACETAMINOPHEN (TYLENOL) 325 MG TABLET    Take 2 tablets (650 mg total) by mouth every 6 (six) hours as needed for mild pain or moderate pain.   CALCIUM-VITAMIN D-VITAMIN K (VIACTIV) [id number] MG-UNT-MCG CHEW    Chew 1 tablet by mouth daily. Take 1 tablet daily for calcium.   FLUTICASONE (FLONASE) 50 MCG/ACT NASAL SPRAY    Shake well before each use and instill one to two sprays in each nostril once daily to control congestion   FLUTICASONE-SALMETEROL (ADVAIR DISKUS) 250-50 MCG/DOSE AEPB    Inhale once daily to help breathing   LEVOTHYROXINE (  SYNTHROID, LEVOTHROID) 50 MCG TABLET    Take one tablet by mouth once daily for thyroid   MONTELUKAST (SINGULAIR) 10 MG TABLET    Take one tablet by mouth once daily to help breathing   MULTIPLE VITAMINS-MINERALS (MULTIVITAMIN WITH MINERALS) TABLET    Take 1 tablet by mouth daily. Take 1 tablet as a vitamin  supplement.   MYRBETRIQ 25 MG TB24 TABLET    TAKE ONE TABLET BY MOUTH ONCE DAILY   OMEPRAZOLE (PRILOSEC) 20 MG CAPSULE    Take 20 mg by mouth daily. Take 1 tablet daily for heartburn or stomach reflux.   RALOXIFENE (EVISTA) 60 MG TABLET    Take 1 tablet by mouth  daily to treat osteoporosis  Modified Medications   No medications on file  Discontinued Medications   No medications on file    Physical Exam: Filed Vitals:   11/18/14 1538  BP: 122/56  Pulse: 84  Temp: 99.2 F (37.3 C)  TempSrc: Tympanic  Resp: 18   There is no weight on file to calculate BMI.  Physical Exam  Constitutional: She is oriented to person, place, and time. She appears well-developed and well-nourished. No distress.  HENT:  Head: Normocephalic and atraumatic.  Right Ear: External ear normal.  Left Ear: External ear normal.  Nose: Nose normal.  Mouth/Throat: Oropharynx is clear and moist.  Mild hearing deficit  Eyes:  Corrective lenses  Neck: No JVD present. No tracheal deviation present. No thyromegaly present.  Cardiovascular: Normal rate, regular rhythm, normal heart sounds and intact distal pulses.  Exam reveals no gallop and no friction rub.   No murmur heard. Pulmonary/Chest: No respiratory distress. She has no wheezes. She has rales. She exhibits no tenderness.  Rales and wheeze  Abdominal: She exhibits no distension and no mass. There is no tenderness.  Musculoskeletal: She exhibits edema and tenderness.  Unstable gait. Using cane. Right 4th finger contracture has been released. Left shoulder fx 11/06/14, in sling, f/u Ortho, pain with movement, mild edema in the left forearm and hand.     Lymphadenopathy:    She has no cervical adenopathy.  Neurological: She is alert and oriented to person, place, and time. No cranial nerve deficit. Coordination normal.  10/27/12 MMSE 28/30. Passed clock drawing.  Skin: No rash noted. No erythema. No pallor.  Psychiatric: She has a normal mood and affect.  Her behavior is normal. Judgment and thought content normal.    Labs reviewed: Basic Metabolic Panel:  Recent Labs  02/14/14 0803  NA 142  K 4.9  BUN 20  CREATININE 0.9    Liver Function Tests:  Recent Labs  02/14/14 0803  AST 20  ALT 17  ALKPHOS 62    CBC:  Recent Labs  08/09/14 1111  HGB 14.0    Lab Results  Component Value Date   TSH 0.55 02/14/2014   No results found for: HGBA1C Lab Results  Component Value Date   CHOL 211* 02/14/2014   HDL 47 02/14/2014   LDLCALC 133 02/14/2014   TRIG 155 02/14/2014    Significant Diagnostic Results since last visit: none  Patient Care Team: [name], MD as PCP - General (Internal Medicine) Newton Pigg, MD as Consulting Physician (Obstetrics and Gynecology) Monna Fam, MD as Consulting Physician (Ophthalmology) Melida Quitter, MD as Consulting Physician (Otolaryngology) Myrlene Broker, MD as Consulting Physician (Urology) [facility]  Assessment/Plan Problem List Items Addressed This Visit    Fracture of left shoulder  11/06/14 X-ray left shoulder  IMPRESSION: Comminuted intra-articular humeral head/neck fracture. Continue sling, f/u Ortho Obtain CBC and CMP      GERD (gastroesophageal reflux disease)    Stable, continue Omeprazole 20mg  daily.       Hypertonicity of bladder - Primary    Managed with Myrbetriq      Hypothyroid    Last TSH 0.55 02/14/14, continue Synthroid 50mcg. Update TSH      Intrinsic asthma    Stable, continue Flonase, Advair      Memory loss    11/06/14 CT head and cervical spine: MPRESSION: No evidence of acute intracranial abnormality. Chronic small-vessel white matter ischemic changes.  No static evidence of acute injury to the cervical spine. Mild multilevel degenerative changes again noted.  Paranasal sinusitis again identified.  MMSE       Osteoporosis    Continue Cal, Vit D, Evista          Family/ staff Communication: assist  th patient with ADLs  Labs/tests ordered: CBC, CMP, TSH, MMSE  ManXie Jossilyn Benda NP [facility] [address], [place] [address] On Call:  [phone] & follow prompts after 5pm & weekends Office Phone:  [phone] Office Fax:  [phone]

## 2014-11-25 NOTE — Assessment & Plan Note (Signed)
Stable, continue Omeprazole 20mg daily.  

## 2014-11-25 NOTE — Assessment & Plan Note (Signed)
Continue Cal, Vit D, Evista

## 2014-11-25 NOTE — Assessment & Plan Note (Signed)
11/06/14 CT head and cervical spine: MPRESSION: No evidence of acute intracranial abnormality. Chronic small-vessel white matter ischemic changes.  No static evidence of acute injury to the cervical spine. Mild multilevel degenerative changes again noted.  Paranasal sinusitis again identified.  MMSE

## 2014-11-28 DIAGNOSIS — D649 Anemia, unspecified: Secondary | ICD-10-CM | POA: Diagnosis not present

## 2014-11-28 DIAGNOSIS — E876 Hypokalemia: Secondary | ICD-10-CM | POA: Diagnosis not present

## 2014-11-28 DIAGNOSIS — R296 Repeated falls: Secondary | ICD-10-CM | POA: Diagnosis not present

## 2014-11-28 DIAGNOSIS — M6281 Muscle weakness (generalized): Secondary | ICD-10-CM | POA: Diagnosis not present

## 2014-11-28 DIAGNOSIS — R2681 Unsteadiness on feet: Secondary | ICD-10-CM | POA: Diagnosis not present

## 2014-11-28 DIAGNOSIS — R29898 Other symptoms and signs involving the musculoskeletal system: Secondary | ICD-10-CM | POA: Diagnosis not present

## 2014-11-28 DIAGNOSIS — E039 Hypothyroidism, unspecified: Secondary | ICD-10-CM | POA: Diagnosis not present

## 2014-11-28 LAB — CBC AND DIFFERENTIAL
HCT: 35 % — AB (ref 36–46)
HEMOGLOBIN: 11.4 g/dL — AB (ref 12.0–16.0)
Platelets: 363 10*3/uL (ref 150–399)
WBC: 7.4 10^3/mL

## 2014-11-28 LAB — TSH: TSH: 0.69 u[IU]/mL (ref 0.41–5.90)

## 2014-11-28 LAB — HEPATIC FUNCTION PANEL
ALK PHOS: 92 U/L (ref 25–125)
ALT: 16 U/L (ref 7–35)
AST: 13 U/L (ref 13–35)
Bilirubin, Total: 0.4 mg/dL

## 2014-11-28 LAB — BASIC METABOLIC PANEL
BUN: 13 mg/dL (ref 4–21)
Creatinine: 0.8 mg/dL (ref 0.5–1.1)
Glucose: 94 mg/dL
POTASSIUM: 4.5 mmol/L (ref 3.4–5.3)
SODIUM: 140 mmol/L (ref 137–147)

## 2014-11-29 ENCOUNTER — Other Ambulatory Visit: Payer: Self-pay | Admitting: Nurse Practitioner

## 2014-11-29 DIAGNOSIS — R296 Repeated falls: Secondary | ICD-10-CM | POA: Diagnosis not present

## 2014-11-29 DIAGNOSIS — E039 Hypothyroidism, unspecified: Secondary | ICD-10-CM

## 2014-11-29 DIAGNOSIS — R2681 Unsteadiness on feet: Secondary | ICD-10-CM | POA: Diagnosis not present

## 2014-11-29 DIAGNOSIS — R29898 Other symptoms and signs involving the musculoskeletal system: Secondary | ICD-10-CM | POA: Diagnosis not present

## 2014-11-29 DIAGNOSIS — M6281 Muscle weakness (generalized): Secondary | ICD-10-CM | POA: Diagnosis not present

## 2014-11-30 DIAGNOSIS — M6281 Muscle weakness (generalized): Secondary | ICD-10-CM | POA: Diagnosis not present

## 2014-11-30 DIAGNOSIS — R296 Repeated falls: Secondary | ICD-10-CM | POA: Diagnosis not present

## 2014-11-30 DIAGNOSIS — R2681 Unsteadiness on feet: Secondary | ICD-10-CM | POA: Diagnosis not present

## 2014-11-30 DIAGNOSIS — R29898 Other symptoms and signs involving the musculoskeletal system: Secondary | ICD-10-CM | POA: Diagnosis not present

## 2014-12-02 DIAGNOSIS — R2681 Unsteadiness on feet: Secondary | ICD-10-CM | POA: Diagnosis not present

## 2014-12-02 DIAGNOSIS — R29898 Other symptoms and signs involving the musculoskeletal system: Secondary | ICD-10-CM | POA: Diagnosis not present

## 2014-12-02 DIAGNOSIS — M6281 Muscle weakness (generalized): Secondary | ICD-10-CM | POA: Diagnosis not present

## 2014-12-02 DIAGNOSIS — R296 Repeated falls: Secondary | ICD-10-CM | POA: Diagnosis not present

## 2014-12-05 DIAGNOSIS — R2681 Unsteadiness on feet: Secondary | ICD-10-CM | POA: Diagnosis not present

## 2014-12-05 DIAGNOSIS — R29898 Other symptoms and signs involving the musculoskeletal system: Secondary | ICD-10-CM | POA: Diagnosis not present

## 2014-12-05 DIAGNOSIS — R296 Repeated falls: Secondary | ICD-10-CM | POA: Diagnosis not present

## 2014-12-05 DIAGNOSIS — M6281 Muscle weakness (generalized): Secondary | ICD-10-CM | POA: Diagnosis not present

## 2014-12-05 DIAGNOSIS — S42292D Other displaced fracture of upper end of left humerus, subsequent encounter for fracture with routine healing: Secondary | ICD-10-CM | POA: Diagnosis not present

## 2014-12-06 ENCOUNTER — Encounter: Payer: Self-pay | Admitting: Nurse Practitioner

## 2014-12-06 DIAGNOSIS — S62605A Fracture of unspecified phalanx of left ring finger, initial encounter for closed fracture: Secondary | ICD-10-CM | POA: Insufficient documentation

## 2014-12-07 DIAGNOSIS — S42292A Other displaced fracture of upper end of left humerus, initial encounter for closed fracture: Secondary | ICD-10-CM | POA: Diagnosis not present

## 2014-12-07 DIAGNOSIS — J45909 Unspecified asthma, uncomplicated: Secondary | ICD-10-CM | POA: Diagnosis not present

## 2014-12-07 DIAGNOSIS — R2681 Unsteadiness on feet: Secondary | ICD-10-CM | POA: Diagnosis not present

## 2014-12-07 DIAGNOSIS — R55 Syncope and collapse: Secondary | ICD-10-CM | POA: Diagnosis not present

## 2014-12-07 DIAGNOSIS — M6284 Sarcopenia: Secondary | ICD-10-CM | POA: Diagnosis not present

## 2014-12-07 DIAGNOSIS — N3281 Overactive bladder: Secondary | ICD-10-CM | POA: Diagnosis not present

## 2014-12-07 DIAGNOSIS — M818 Other osteoporosis without current pathological fracture: Secondary | ICD-10-CM | POA: Diagnosis not present

## 2014-12-09 DIAGNOSIS — N3281 Overactive bladder: Secondary | ICD-10-CM | POA: Diagnosis not present

## 2014-12-09 DIAGNOSIS — J45909 Unspecified asthma, uncomplicated: Secondary | ICD-10-CM | POA: Diagnosis not present

## 2014-12-09 DIAGNOSIS — R2681 Unsteadiness on feet: Secondary | ICD-10-CM | POA: Diagnosis not present

## 2014-12-09 DIAGNOSIS — M818 Other osteoporosis without current pathological fracture: Secondary | ICD-10-CM | POA: Diagnosis not present

## 2014-12-09 DIAGNOSIS — S42292A Other displaced fracture of upper end of left humerus, initial encounter for closed fracture: Secondary | ICD-10-CM | POA: Diagnosis not present

## 2014-12-09 DIAGNOSIS — M6284 Sarcopenia: Secondary | ICD-10-CM | POA: Diagnosis not present

## 2014-12-12 DIAGNOSIS — M25442 Effusion, left hand: Secondary | ICD-10-CM | POA: Diagnosis not present

## 2014-12-12 DIAGNOSIS — M6284 Sarcopenia: Secondary | ICD-10-CM | POA: Diagnosis not present

## 2014-12-12 DIAGNOSIS — N3281 Overactive bladder: Secondary | ICD-10-CM | POA: Diagnosis not present

## 2014-12-12 DIAGNOSIS — M79645 Pain in left finger(s): Secondary | ICD-10-CM | POA: Diagnosis not present

## 2014-12-12 DIAGNOSIS — S42292A Other displaced fracture of upper end of left humerus, initial encounter for closed fracture: Secondary | ICD-10-CM | POA: Diagnosis not present

## 2014-12-12 DIAGNOSIS — R2681 Unsteadiness on feet: Secondary | ICD-10-CM | POA: Diagnosis not present

## 2014-12-12 DIAGNOSIS — S62345S Nondisplaced fracture of base of fourth metacarpal bone, left hand, sequela: Secondary | ICD-10-CM | POA: Diagnosis not present

## 2014-12-12 DIAGNOSIS — M25542 Pain in joints of left hand: Secondary | ICD-10-CM | POA: Diagnosis not present

## 2014-12-12 DIAGNOSIS — J45909 Unspecified asthma, uncomplicated: Secondary | ICD-10-CM | POA: Diagnosis not present

## 2014-12-12 DIAGNOSIS — M818 Other osteoporosis without current pathological fracture: Secondary | ICD-10-CM | POA: Diagnosis not present

## 2014-12-13 ENCOUNTER — Non-Acute Institutional Stay (SKILLED_NURSING_FACILITY): Payer: Medicare Other | Admitting: Nurse Practitioner

## 2014-12-13 ENCOUNTER — Encounter: Payer: Self-pay | Admitting: Nurse Practitioner

## 2014-12-13 DIAGNOSIS — K219 Gastro-esophageal reflux disease without esophagitis: Secondary | ICD-10-CM

## 2014-12-13 DIAGNOSIS — S62605S Fracture of unspecified phalanx of left ring finger, sequela: Secondary | ICD-10-CM | POA: Diagnosis not present

## 2014-12-13 DIAGNOSIS — S4292XS Fracture of left shoulder girdle, part unspecified, sequela: Secondary | ICD-10-CM

## 2014-12-13 DIAGNOSIS — M810 Age-related osteoporosis without current pathological fracture: Secondary | ICD-10-CM

## 2014-12-13 DIAGNOSIS — J4520 Mild intermittent asthma, uncomplicated: Secondary | ICD-10-CM | POA: Diagnosis not present

## 2014-12-13 DIAGNOSIS — E039 Hypothyroidism, unspecified: Secondary | ICD-10-CM

## 2014-12-13 DIAGNOSIS — N318 Other neuromuscular dysfunction of bladder: Secondary | ICD-10-CM

## 2014-12-13 NOTE — Assessment & Plan Note (Signed)
Continue Cal, Vit D, Evista

## 2014-12-13 NOTE — Assessment & Plan Note (Signed)
Last TSH 0.55 02/14/14, 11/28/14 TSH 0.694,  continue Synthroid 28mcg.

## 2014-12-13 NOTE — Assessment & Plan Note (Signed)
11/06/14 X-ray left shoulder IMPRESSION: Comminuted intra-articular humeral head/neck fracture. 12/05/14 [facility]: 4 wks left proximal humerus fracture healing uneventfully.  Less pain in the left shoulder, less swelling LUE, continue sling.

## 2014-12-13 NOTE — Assessment & Plan Note (Signed)
Stable, continue Omeprazole 20mg daily.  

## 2014-12-13 NOTE — Assessment & Plan Note (Signed)
12/05/14 [facility]: X-ay the left hand show nondisplaced fracture of the base of the middle phalanx of the ring finger. Plan ulnar gutter splinting.

## 2014-12-13 NOTE — Assessment & Plan Note (Signed)
Managed with Myrbetriq, average bathroom trips 2x/night. 

## 2014-12-13 NOTE — Assessment & Plan Note (Addendum)
Stable, continue Flonase, Singulair 10mg  daily, Advair, Ventolin HFA 2 puffs q6h prn

## 2014-12-13 NOTE — Progress Notes (Signed)
Patient ID: Kimberly Greer, female   DOB: [birth year], 81 y.o.   MRN: [id number]  Location:  SNF [facility] Provider:  Marlana Latus NP  Code Status:  DNR Goals of care: Advanced Directive information    Chief Complaint  Patient presents with  . Medical Management of Chronic Issues     HPI: Patient is a 81 y.o. female seen in the SNF at [facility] today for evaluation of her chronic medical conditions. Hx of hypothyroidism, takes Levothyroxine 68mcg daily, last TSH 0.49 11/28/14, average bathroom trips x2/night while on Myrbetriq 25mg  daily. Left shoulder is less pain, left fingers are in splint, f/u Ortho, she needs assistance with ADLs due to the fxs of the left shoulder and finger.   Review of Systems:  Review of Systems  Constitutional: Negative for fever.  HENT: Positive for congestion and hearing loss. Negative for nosebleeds.   Respiratory: Positive for shortness of breath and wheezing. Negative for cough and stridor.   Cardiovascular: Negative for chest pain, palpitations and leg swelling.  Genitourinary: Negative for dysuria, frequency and flank pain.  Musculoskeletal: Positive for joint pain. Negative for myalgias.       Scar right palm from release of 4th finger contracture Left shoulder fx 11/06/14, in sling, f/u Ortho, pain with movement  Skin: Negative.   Neurological: Negative for dizziness, tremors, seizures and headaches.  Endo/Heme/Allergies: Negative for polydipsia.  Psychiatric/Behavioral: Negative.     Past Medical History  Diagnosis Date  . Asthma   . Hypothyroid   . Senile osteoporosis 06/16/2010  . Other abnormal blood chemistry 06/19/2010    hyperglycemia  . Pain in limb 12/05/2009  . Other and unspecified hyperlipidemia 10/24/2009  . Syncope and collapse 05/30/2009  . Carpal tunnel syndrome 03/07/2009  . Sensorineural hearing loss, unspecified 02/21/2009  . Unspecified nasal polyp 02/21/2009  . Disturbance of skin sensation 02/21/2009  . Unspecified  urinary incontinence 02/21/2009  . Diverticulosis of colon (without mention of hemorrhage) 02/16/2009  . 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 Other atopic dermatitis and related conditions 02/16/2009  . Osteoarthrosis, unspecified whether generalized or localized, unspecified site 02/16/2009  . Otosclerosis, unspecified 02/22/2003    Patient Active Problem List   Diagnosis Date Noted  . Fracture of phalanx of left ring finger 12/06/2014  . GERD (gastroesophageal reflux disease) 11/25/2014  . Osteoporosis 11/25/2014  . Fracture of left shoulder 11/25/2014  . Abnormality of gait 03/08/2014  . Trigger finger, acquired 03/08/2014  . Dyspnea 02/09/2013  . Intrinsic asthma 02/09/2013  . Memory loss 06/23/2012  . Hypothyroid   . Hyperglycemia 06/19/2010  . Hyperlipemia 10/24/2009  . Sensorineural hearing loss, unspecified 02/21/2009  . Urinary incontinence 02/21/2009  . 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 Osteoarthritis 02/16/2009    Allergies  Allergen Reactions  . Avelox [Moxifloxacin Hcl In Nacl] Itching  . Doxycycline   . Hista-Tabs [Triprolidine-Pse] Other (See Comments)    Passed out  . Septra [Sulfamethoxazole-Trimethoprim]   . Sulfa Antibiotics     Itching/rash (all over)    Medications: Patient's Medications  New Prescriptions   No medications on file  Previous Medications   ACETAMINOPHEN (TYLENOL) 325 MG TABLET    Take 2 tablets (650 mg total) by mouth every 6 (six) hours as needed for mild pain or moderate pain.   CALCIUM-VITAMIN D-VITAMIN K (VIACTIV) [phone] MG-UNT-MCG CHEW    Chew 1 tablet by mouth daily. Take 1 tablet daily for calcium.   FLUTICASONE (FLONASE) 50 MCG/ACT NASAL SPRAY  Shake well before each use and instill one to two sprays in each nostril once daily to control congestion   FLUTICASONE-SALMETEROL (ADVAIR DISKUS) 250-50 MCG/DOSE AEPB    Inhale once daily to help breathing   LEVOTHYROXINE (SYNTHROID, LEVOTHROID) 50 MCG TABLET    Take one tablet  by mouth once daily for thyroid   MONTELUKAST (SINGULAIR) 10 MG TABLET    Take one tablet by mouth once daily to help breathing   MULTIPLE VITAMINS-MINERALS (MULTIVITAMIN WITH MINERALS) TABLET    Take 1 tablet by mouth daily. Take 1 tablet as a vitamin supplement.   MYRBETRIQ 25 MG TB24 TABLET    TAKE ONE TABLET BY MOUTH ONCE DAILY   OMEPRAZOLE (PRILOSEC) 20 MG CAPSULE    Take 20 mg by mouth daily. Take 1 tablet daily for heartburn or stomach reflux.   RALOXIFENE (EVISTA) 60 MG TABLET    Take 1 tablet by mouth  daily to treat osteoporosis  Modified Medications   No medications on file  Discontinued Medications   No medications on file    Physical Exam: Filed Vitals:   12/13/14 1423  BP: 134/64  Pulse: 88  Temp: 97.6 F (36.4 C)  TempSrc: Tympanic  Resp: 20   There is no weight on file to calculate BMI.  Physical Exam  Constitutional: She is oriented to person, place, and time. She appears well-developed and well-nourished. No distress.  HENT:  Head: Normocephalic and atraumatic.  Right Ear: External ear normal.  Left Ear: External ear normal.  Nose: Nose normal.  Mouth/Throat: Oropharynx is clear and moist.  Mild hearing deficit  Eyes:  Corrective lenses  Neck: No JVD present. No tracheal deviation present. No thyromegaly present.  Cardiovascular: Normal rate, regular rhythm, normal heart sounds and intact distal pulses.  Exam reveals no gallop and no friction rub.   No murmur heard. Pulmonary/Chest: No respiratory distress. She has no wheezes. She has rales. She exhibits no tenderness.  Rales and wheeze  Abdominal: She exhibits no distension and no mass. There is no tenderness.  Musculoskeletal: She exhibits edema and tenderness.  Unstable gait. Using cane. Right 4th finger contracture has been released. Left shoulder fx 11/06/14, in sling, f/u Ortho, pain with movement, mild edema in the left forearm and hand. Lt hand splint.     Lymphadenopathy:    She has no  cervical adenopathy.  Neurological: She is alert and oriented to person, place, and time. No cranial nerve deficit. Coordination normal.  10/27/12 MMSE 28/30. Passed clock drawing.  Skin: No rash noted. No erythema. No pallor.  Psychiatric: She has a normal mood and affect. Her behavior is normal. Judgment and thought content normal.    Labs reviewed: Basic Metabolic Panel:  Recent Labs  02/14/14 0803 11/28/14  NA 142 140  K 4.9 4.5  BUN 20 13  CREATININE 0.9 0.8    Liver Function Tests:  Recent Labs  02/14/14 0803 11/28/14  AST 20 13  ALT 17 16  ALKPHOS 62 92    CBC:  Recent Labs  08/09/14 1111 11/28/14  WBC  --  7.4  HGB 14.0 11.4*  HCT  --  35*  PLT  --  363    Lab Results  Component Value Date   TSH 0.69 11/28/2014   No results found for: HGBA1C Lab Results  Component Value Date   CHOL 211* 02/14/2014   HDL 47 02/14/2014   LDLCALC 133 02/14/2014   TRIG 155 02/14/2014    Significant Diagnostic Results  since last visit: none  Patient Care Team: [name], MD as PCP - General (Internal Medicine) Newton Pigg, MD as Consulting Physician (Obstetrics and Gynecology) Monna Fam, MD as Consulting Physician (Ophthalmology) Melida Quitter, MD as Consulting Physician (Otolaryngology) Myrlene Broker, MD as Consulting Physician (Urology) [facility]  Assessment/Plan Problem List Items Addressed This Visit    Hypothyroid - Primary    Last TSH 0.55 02/14/14, 11/28/14 TSH 0.694,  continue Synthroid 57mcg.      Hypertonicity of bladder    Managed with Myrbetriq, average bathroom trips 2x/night.       Intrinsic asthma    Stable, continue Flonase, Singulair 10mg  daily, Advair, Ventolin HFA 2 puffs q6h prn      GERD (gastroesophageal reflux disease)    Stable, continue Omeprazole 20mg  daily.       Osteoporosis    Continue Cal, Vit D, Evista      Fracture of left shoulder    11/06/14 X-ray left shoulder IMPRESSION: Comminuted  intra-articular humeral head/neck fracture. 12/05/14 [facility]: 4 wks left proximal humerus fracture healing uneventfully.  Less pain in the left shoulder, less swelling LUE, continue sling.       Fracture of phalanx of left ring finger    12/05/14 [facility]: X-ay the left hand show nondisplaced fracture of the base of the middle phalanx of the ring finger. Plan ulnar gutter splinting.           Family/ staff Communication: assist th patient with ADLs  Labs/tests ordered: none  [facility] Shamon Lobo NP [facility] [address], [place] [address] On Call:  [phone] & follow prompts after 5pm & weekends Office Phone:  [phone] Office Fax:  [phone]

## 2014-12-15 DIAGNOSIS — S42292A Other displaced fracture of upper end of left humerus, initial encounter for closed fracture: Secondary | ICD-10-CM | POA: Diagnosis not present

## 2014-12-15 DIAGNOSIS — N3281 Overactive bladder: Secondary | ICD-10-CM | POA: Diagnosis not present

## 2014-12-15 DIAGNOSIS — R2681 Unsteadiness on feet: Secondary | ICD-10-CM | POA: Diagnosis not present

## 2014-12-15 DIAGNOSIS — J45909 Unspecified asthma, uncomplicated: Secondary | ICD-10-CM | POA: Diagnosis not present

## 2014-12-15 DIAGNOSIS — M6284 Sarcopenia: Secondary | ICD-10-CM | POA: Diagnosis not present

## 2014-12-15 DIAGNOSIS — M818 Other osteoporosis without current pathological fracture: Secondary | ICD-10-CM | POA: Diagnosis not present

## 2014-12-16 ENCOUNTER — Encounter: Payer: Self-pay | Admitting: Nurse Practitioner

## 2014-12-16 ENCOUNTER — Non-Acute Institutional Stay (SKILLED_NURSING_FACILITY): Payer: Medicare Other | Admitting: Nurse Practitioner

## 2014-12-16 DIAGNOSIS — E039 Hypothyroidism, unspecified: Secondary | ICD-10-CM

## 2014-12-16 DIAGNOSIS — K219 Gastro-esophageal reflux disease without esophagitis: Secondary | ICD-10-CM

## 2014-12-16 DIAGNOSIS — R413 Other amnesia: Secondary | ICD-10-CM | POA: Diagnosis not present

## 2014-12-16 DIAGNOSIS — S4292XS Fracture of left shoulder girdle, part unspecified, sequela: Secondary | ICD-10-CM

## 2014-12-16 DIAGNOSIS — M810 Age-related osteoporosis without current pathological fracture: Secondary | ICD-10-CM | POA: Diagnosis not present

## 2014-12-16 DIAGNOSIS — J4520 Mild intermittent asthma, uncomplicated: Secondary | ICD-10-CM

## 2014-12-16 DIAGNOSIS — N318 Other neuromuscular dysfunction of bladder: Secondary | ICD-10-CM | POA: Diagnosis not present

## 2014-12-16 DIAGNOSIS — S62605S Fracture of unspecified phalanx of left ring finger, sequela: Secondary | ICD-10-CM | POA: Diagnosis not present

## 2014-12-16 NOTE — Progress Notes (Signed)
Patient ID: Kimberly Greer, female   DOB: 1921-12-15, 81 y.o.   MRN: [id number]  Location:  SNF [facility] Provider:  Marlana Latus NP  Code Status:  DNR Goals of care: Advanced Directive information    Chief Complaint  Patient presents with  . Medical Management of Chronic Issues  . Acute Visit    RLE edema     HPI: Patient is a 81 y.o. female seen in the SNF at [facility] today for evaluation of her chronic medical conditions. I was asked to evaluate 3+ edema of the RLE, the patient was found to have trace edema RLE upon my examination.  Hx of hypothyroidism, takes Levothyroxine 41mcg daily, last TSH 0.49 11/28/14, average bathroom trips x2/night while on Myrbetriq 25mg  daily. Left shoulder is less pain, left fingers(left ring finger) are in splint, f/u Ortho, she needs assistance with ADLs due to the fxs of the left shoulder and 4th finger released from contracture.   Review of Systems:  Review of Systems  Constitutional: Negative for fever.  HENT: Positive for hearing loss. Negative for congestion and nosebleeds.   Respiratory: Positive for shortness of breath. Negative for cough, wheezing and stridor.   Cardiovascular: Positive for leg swelling. Negative for chest pain and palpitations.       Trace RLE  Genitourinary: Negative for dysuria, frequency and flank pain.  Musculoskeletal: Positive for joint pain. Negative for myalgias.       Scar right palm from release of 4th finger contracture Left shoulder fx 11/06/14, in sling, f/u Ortho, pain with movement  Skin: Negative.   Neurological: Negative for dizziness, tremors, seizures and headaches.  Endo/Heme/Allergies: Negative for polydipsia.  Psychiatric/Behavioral: Negative.     Past Medical History  Diagnosis Date  . Asthma   . Hypothyroid   . Senile osteoporosis 06/16/2010  . Other abnormal blood chemistry 06/19/2010    hyperglycemia  . Pain in limb 12/05/2009  . Other and unspecified hyperlipidemia 10/24/2009  . Syncope  and collapse 05/30/2009  . Carpal tunnel syndrome 03/07/2009  . Sensorineural hearing loss, unspecified 02/21/2009  . Unspecified nasal polyp 02/21/2009  . Disturbance of skin sensation 02/21/2009  . Unspecified urinary incontinence 02/21/2009  . Diverticulosis of colon (without mention of hemorrhage) 02/16/2009  . 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 Other atopic dermatitis and related conditions 02/16/2009  . Osteoarthrosis, unspecified whether generalized or localized, unspecified site 02/16/2009  . Otosclerosis, unspecified 02/22/2003    Patient Active Problem List   Diagnosis Date Noted  . Fracture of phalanx of left ring finger 12/06/2014  . GERD (gastroesophageal reflux disease) 11/25/2014  . Osteoporosis 11/25/2014  . Fracture of left shoulder 11/25/2014  . Abnormality of gait 03/08/2014  . Trigger finger, acquired 03/08/2014  . Dyspnea 02/09/2013  . Intrinsic asthma 02/09/2013  . Memory loss 06/23/2012  . Hypothyroid   . Hyperglycemia 06/19/2010  . Hyperlipemia 10/24/2009  . Sensorineural hearing loss, unspecified 02/21/2009  . Urinary incontinence 02/21/2009  . 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 Osteoarthritis 02/16/2009    Allergies  Allergen Reactions  . Avelox [Moxifloxacin Hcl In Nacl] Itching  . Doxycycline   . Hista-Tabs [Triprolidine-Pse] Other (See Comments)    Passed out  . Septra [Sulfamethoxazole-Trimethoprim]   . Sulfa Antibiotics     Itching/rash (all over)    Medications: Patient's Medications  New Prescriptions   No medications on file  Previous Medications   ACETAMINOPHEN (TYLENOL) 325 MG TABLET    Take 2 tablets (650 mg total) by mouth  every 6 (six) hours as needed for mild pain or moderate pain.   CALCIUM-VITAMIN D-VITAMIN K (VIACTIV) [id number] MG-UNT-MCG CHEW    Chew 1 tablet by mouth daily. Take 1 tablet daily for calcium.   FLUTICASONE (FLONASE) 50 MCG/ACT NASAL SPRAY    Shake well before each use and instill one to two sprays in each  nostril once daily to control congestion   FLUTICASONE-SALMETEROL (ADVAIR DISKUS) 250-50 MCG/DOSE AEPB    Inhale once daily to help breathing   LEVOTHYROXINE (SYNTHROID, LEVOTHROID) 50 MCG TABLET    Take one tablet by mouth once daily for thyroid   MONTELUKAST (SINGULAIR) 10 MG TABLET    Take one tablet by mouth once daily to help breathing   MULTIPLE VITAMINS-MINERALS (MULTIVITAMIN WITH MINERALS) TABLET    Take 1 tablet by mouth daily. Take 1 tablet as a vitamin supplement.   MYRBETRIQ 25 MG TB24 TABLET    TAKE ONE TABLET BY MOUTH ONCE DAILY   OMEPRAZOLE (PRILOSEC) 20 MG CAPSULE    Take 20 mg by mouth daily. Take 1 tablet daily for heartburn or stomach reflux.   RALOXIFENE (EVISTA) 60 MG TABLET    Take 1 tablet by mouth  daily to treat osteoporosis  Modified Medications   No medications on file  Discontinued Medications   No medications on file    Physical Exam: Filed Vitals:   12/16/14 1646  BP: 137/64  Pulse: 70  Temp: 97.5 F (36.4 C)  TempSrc: Tympanic  Resp: 20   There is no weight on file to calculate BMI.  Physical Exam  Constitutional: She is oriented to person, place, and time. She appears well-developed and well-nourished. No distress.  HENT:  Head: Normocephalic and atraumatic.  Right Ear: External ear normal.  Left Ear: External ear normal.  Nose: Nose normal.  Mouth/Throat: Oropharynx is clear and moist.  Mild hearing deficit  Eyes:  Corrective lenses  Neck: No JVD present. No tracheal deviation present. No thyromegaly present.  Cardiovascular: Normal rate, regular rhythm, normal heart sounds and intact distal pulses.  Exam reveals no gallop and no friction rub.   No murmur heard. Pulmonary/Chest: No respiratory distress. She has no wheezes. She has rales. She exhibits no tenderness.  Abdominal: She exhibits no distension and no mass. There is no tenderness.  Musculoskeletal: She exhibits edema and tenderness.  Unstable gait. Using cane. Right 4th finger  contracture has been released. Left shoulder fx 11/06/14, in sling, f/u Ortho, pain with movement, mild edema in the left forearm and hand. Lt hand splint.  Trace edema seen RLE    Lymphadenopathy:    She has no cervical adenopathy.  Neurological: She is alert and oriented to person, place, and time. No cranial nerve deficit. Coordination normal.  10/27/12 MMSE 28/30. Passed clock drawing.  Skin: No rash noted. No erythema. No pallor.  Psychiatric: She has a normal mood and affect. Her behavior is normal. Judgment and thought content normal.    Labs reviewed: Basic Metabolic Panel:  Recent Labs  02/14/14 0803 11/28/14  NA 142 140  K 4.9 4.5  BUN 20 13  CREATININE 0.9 0.8    Liver Function Tests:  Recent Labs  02/14/14 0803 11/28/14  AST 20 13  ALT 17 16  ALKPHOS 62 92    CBC:  Recent Labs  08/09/14 1111 11/28/14  WBC  --  7.4  HGB 14.0 11.4*  HCT  --  35*  PLT  --  363    Lab Results  Component Value Date   TSH 0.69 11/28/2014   No results found for: HGBA1C Lab Results  Component Value Date   CHOL 211* 02/14/2014   HDL 47 02/14/2014   LDLCALC 133 02/14/2014   TRIG 155 02/14/2014    Significant Diagnostic Results since last visit: none  Patient Care Team: [name], MD as PCP - General (Internal Medicine) Newton Pigg, MD as Consulting Physician (Obstetrics and Gynecology) Monna Fam, MD as Consulting Physician (Ophthalmology) Melida Quitter, MD as Consulting Physician (Otolaryngology) Myrlene Broker, MD as Consulting Physician (Urology) [facility]  Assessment/Plan Problem List Items Addressed This Visit    Hypothyroid - Primary    Last TSH 0.55 02/14/14, 11/28/14 TSH 0.694,  continue Synthroid 46mcg.      Hypertonicity of bladder    Managed with Myrbetriq, average bathroom trips 2x/night.       Memory loss    11/06/14 CT head and cervical spine: MPRESSION: No evidence of acute intracranial abnormality. Chronic  small-vessel white matter ischemic changes.  No static evidence of acute injury to the cervical spine. Mild multilevel degenerative changes again noted.  Paranasal sinusitis again identified.  MMSE       Intrinsic asthma    Stable, continue Flonase, Singulair 10mg  daily, Advair, Ventolin HFA 2 puffs q6h prn      GERD (gastroesophageal reflux disease)    Stable, continue Omeprazole 20mg  daily.       Osteoporosis    Continue Cal, Vit D, Evista      Fracture of left shoulder    11/06/14 X-ray left shoulder IMPRESSION: Comminuted intra-articular humeral head/neck fracture. 12/05/14 [facility]: 4 wks left proximal humerus fracture healing uneventfully.  Less pain in the left shoulder, less swelling LUE, continue sling.       Fracture of phalanx of left ring finger    12/05/14 [facility]: X-ay the left hand show nondisplaced fracture of the base of the middle phalanx of the ring finger. Plan ulnar gutter splinting.           Family/ staff Communication: assist th patient with ADLs  Labs/tests ordered: none  [facility] Star Cheese NP [facility] [address], [place] [address] On Call:  [phone] & follow prompts after 5pm & weekends Office Phone:  [phone] Office Fax:  [phone]

## 2014-12-19 DIAGNOSIS — J45909 Unspecified asthma, uncomplicated: Secondary | ICD-10-CM | POA: Diagnosis not present

## 2014-12-19 DIAGNOSIS — M818 Other osteoporosis without current pathological fracture: Secondary | ICD-10-CM | POA: Diagnosis not present

## 2014-12-19 DIAGNOSIS — R2681 Unsteadiness on feet: Secondary | ICD-10-CM | POA: Diagnosis not present

## 2014-12-19 DIAGNOSIS — N3281 Overactive bladder: Secondary | ICD-10-CM | POA: Diagnosis not present

## 2014-12-19 DIAGNOSIS — S62609D Fracture of unspecified phalanx of unspecified finger, subsequent encounter for fracture with routine healing: Secondary | ICD-10-CM | POA: Diagnosis not present

## 2014-12-19 DIAGNOSIS — S42292D Other displaced fracture of upper end of left humerus, subsequent encounter for fracture with routine healing: Secondary | ICD-10-CM | POA: Diagnosis not present

## 2014-12-19 DIAGNOSIS — S42292A Other displaced fracture of upper end of left humerus, initial encounter for closed fracture: Secondary | ICD-10-CM | POA: Diagnosis not present

## 2014-12-19 DIAGNOSIS — M6284 Sarcopenia: Secondary | ICD-10-CM | POA: Diagnosis not present

## 2014-12-20 ENCOUNTER — Encounter: Payer: Self-pay | Admitting: Nurse Practitioner

## 2014-12-20 DIAGNOSIS — M6284 Sarcopenia: Secondary | ICD-10-CM | POA: Diagnosis not present

## 2014-12-20 DIAGNOSIS — J45909 Unspecified asthma, uncomplicated: Secondary | ICD-10-CM | POA: Diagnosis not present

## 2014-12-20 DIAGNOSIS — R2681 Unsteadiness on feet: Secondary | ICD-10-CM | POA: Diagnosis not present

## 2014-12-20 DIAGNOSIS — N3281 Overactive bladder: Secondary | ICD-10-CM | POA: Diagnosis not present

## 2014-12-20 DIAGNOSIS — S42292A Other displaced fracture of upper end of left humerus, initial encounter for closed fracture: Secondary | ICD-10-CM | POA: Diagnosis not present

## 2014-12-20 DIAGNOSIS — M818 Other osteoporosis without current pathological fracture: Secondary | ICD-10-CM | POA: Diagnosis not present

## 2014-12-21 DIAGNOSIS — M79645 Pain in left finger(s): Secondary | ICD-10-CM | POA: Diagnosis not present

## 2014-12-21 DIAGNOSIS — M25542 Pain in joints of left hand: Secondary | ICD-10-CM | POA: Diagnosis not present

## 2014-12-21 DIAGNOSIS — S62345S Nondisplaced fracture of base of fourth metacarpal bone, left hand, sequela: Secondary | ICD-10-CM | POA: Diagnosis not present

## 2014-12-21 DIAGNOSIS — M25442 Effusion, left hand: Secondary | ICD-10-CM | POA: Diagnosis not present

## 2014-12-22 DIAGNOSIS — M818 Other osteoporosis without current pathological fracture: Secondary | ICD-10-CM | POA: Diagnosis not present

## 2014-12-22 DIAGNOSIS — J45909 Unspecified asthma, uncomplicated: Secondary | ICD-10-CM | POA: Diagnosis not present

## 2014-12-22 DIAGNOSIS — M6284 Sarcopenia: Secondary | ICD-10-CM | POA: Diagnosis not present

## 2014-12-22 DIAGNOSIS — R2681 Unsteadiness on feet: Secondary | ICD-10-CM | POA: Diagnosis not present

## 2014-12-22 DIAGNOSIS — S42292A Other displaced fracture of upper end of left humerus, initial encounter for closed fracture: Secondary | ICD-10-CM | POA: Diagnosis not present

## 2014-12-22 DIAGNOSIS — N3281 Overactive bladder: Secondary | ICD-10-CM | POA: Diagnosis not present

## 2014-12-23 NOTE — Assessment & Plan Note (Signed)
Continue Cal, Vit D, Evista 

## 2014-12-23 NOTE — Assessment & Plan Note (Signed)
12/05/14 [facility]: X-ay the left hand show nondisplaced fracture of the base of the middle phalanx of the ring finger. Plan ulnar gutter splinting.  

## 2014-12-23 NOTE — Assessment & Plan Note (Signed)
Stable, continue Flonase, Singulair 10mg daily, Advair, Ventolin HFA 2 puffs q6h prn 

## 2014-12-23 NOTE — Assessment & Plan Note (Signed)
Last TSH 0.55 02/14/14, 11/28/14 TSH 0.694,  continue Synthroid 50mcg. 

## 2014-12-23 NOTE — Assessment & Plan Note (Signed)
Stable, continue Omeprazole 20mg daily.  

## 2014-12-23 NOTE — Assessment & Plan Note (Signed)
Managed with Myrbetriq, average bathroom trips 2x/night. 

## 2014-12-23 NOTE — Assessment & Plan Note (Signed)
11/06/14 X-ray left shoulder IMPRESSION: Comminuted intra-articular humeral head/neck fracture. 12/05/14 [facility]: 4 wks left proximal humerus fracture healing uneventfully.  Less pain in the left shoulder, less swelling LUE, continue sling.  

## 2014-12-23 NOTE — Assessment & Plan Note (Signed)
11/06/14 CT head and cervical spine: MPRESSION: No evidence of acute intracranial abnormality. Chronic small-vessel white matter ischemic changes.  No static evidence of acute injury to the cervical spine. Mild multilevel degenerative changes again noted.  Paranasal sinusitis again identified.  MMSE  

## 2014-12-26 DIAGNOSIS — S42292A Other displaced fracture of upper end of left humerus, initial encounter for closed fracture: Secondary | ICD-10-CM | POA: Diagnosis not present

## 2014-12-26 DIAGNOSIS — J45909 Unspecified asthma, uncomplicated: Secondary | ICD-10-CM | POA: Diagnosis not present

## 2014-12-26 DIAGNOSIS — R2681 Unsteadiness on feet: Secondary | ICD-10-CM | POA: Diagnosis not present

## 2014-12-26 DIAGNOSIS — N3281 Overactive bladder: Secondary | ICD-10-CM | POA: Diagnosis not present

## 2014-12-26 DIAGNOSIS — M6284 Sarcopenia: Secondary | ICD-10-CM | POA: Diagnosis not present

## 2014-12-26 DIAGNOSIS — M818 Other osteoporosis without current pathological fracture: Secondary | ICD-10-CM | POA: Diagnosis not present

## 2014-12-27 DIAGNOSIS — J45909 Unspecified asthma, uncomplicated: Secondary | ICD-10-CM | POA: Diagnosis not present

## 2014-12-27 DIAGNOSIS — N3281 Overactive bladder: Secondary | ICD-10-CM | POA: Diagnosis not present

## 2014-12-27 DIAGNOSIS — S62345S Nondisplaced fracture of base of fourth metacarpal bone, left hand, sequela: Secondary | ICD-10-CM | POA: Diagnosis not present

## 2014-12-27 DIAGNOSIS — R2681 Unsteadiness on feet: Secondary | ICD-10-CM | POA: Diagnosis not present

## 2014-12-27 DIAGNOSIS — M818 Other osteoporosis without current pathological fracture: Secondary | ICD-10-CM | POA: Diagnosis not present

## 2014-12-27 DIAGNOSIS — S42292A Other displaced fracture of upper end of left humerus, initial encounter for closed fracture: Secondary | ICD-10-CM | POA: Diagnosis not present

## 2014-12-27 DIAGNOSIS — M25442 Effusion, left hand: Secondary | ICD-10-CM | POA: Diagnosis not present

## 2014-12-27 DIAGNOSIS — M25542 Pain in joints of left hand: Secondary | ICD-10-CM | POA: Diagnosis not present

## 2014-12-27 DIAGNOSIS — M6284 Sarcopenia: Secondary | ICD-10-CM | POA: Diagnosis not present

## 2014-12-27 DIAGNOSIS — M79645 Pain in left finger(s): Secondary | ICD-10-CM | POA: Diagnosis not present

## 2014-12-28 DIAGNOSIS — M818 Other osteoporosis without current pathological fracture: Secondary | ICD-10-CM | POA: Diagnosis not present

## 2014-12-28 DIAGNOSIS — M6284 Sarcopenia: Secondary | ICD-10-CM | POA: Diagnosis not present

## 2014-12-28 DIAGNOSIS — R2681 Unsteadiness on feet: Secondary | ICD-10-CM | POA: Diagnosis not present

## 2014-12-28 DIAGNOSIS — S42292A Other displaced fracture of upper end of left humerus, initial encounter for closed fracture: Secondary | ICD-10-CM | POA: Diagnosis not present

## 2014-12-28 DIAGNOSIS — N3281 Overactive bladder: Secondary | ICD-10-CM | POA: Diagnosis not present

## 2014-12-28 DIAGNOSIS — J45909 Unspecified asthma, uncomplicated: Secondary | ICD-10-CM | POA: Diagnosis not present

## 2014-12-30 DIAGNOSIS — R2681 Unsteadiness on feet: Secondary | ICD-10-CM | POA: Diagnosis not present

## 2014-12-30 DIAGNOSIS — M818 Other osteoporosis without current pathological fracture: Secondary | ICD-10-CM | POA: Diagnosis not present

## 2014-12-30 DIAGNOSIS — N3281 Overactive bladder: Secondary | ICD-10-CM | POA: Diagnosis not present

## 2014-12-30 DIAGNOSIS — J45909 Unspecified asthma, uncomplicated: Secondary | ICD-10-CM | POA: Diagnosis not present

## 2014-12-30 DIAGNOSIS — S42292A Other displaced fracture of upper end of left humerus, initial encounter for closed fracture: Secondary | ICD-10-CM | POA: Diagnosis not present

## 2014-12-30 DIAGNOSIS — M6284 Sarcopenia: Secondary | ICD-10-CM | POA: Diagnosis not present

## 2015-01-02 ENCOUNTER — Non-Acute Institutional Stay (SKILLED_NURSING_FACILITY): Payer: Medicare Other | Admitting: Internal Medicine

## 2015-01-02 ENCOUNTER — Encounter: Payer: Self-pay | Admitting: Internal Medicine

## 2015-01-02 DIAGNOSIS — R269 Unspecified abnormalities of gait and mobility: Secondary | ICD-10-CM

## 2015-01-02 DIAGNOSIS — R413 Other amnesia: Secondary | ICD-10-CM

## 2015-01-02 DIAGNOSIS — S62605D Fracture of unspecified phalanx of left ring finger, subsequent encounter for fracture with routine healing: Secondary | ICD-10-CM

## 2015-01-02 DIAGNOSIS — R2681 Unsteadiness on feet: Secondary | ICD-10-CM | POA: Diagnosis not present

## 2015-01-02 DIAGNOSIS — N3281 Overactive bladder: Secondary | ICD-10-CM | POA: Diagnosis not present

## 2015-01-02 DIAGNOSIS — S4292XD Fracture of left shoulder girdle, part unspecified, subsequent encounter for fracture with routine healing: Secondary | ICD-10-CM

## 2015-01-02 DIAGNOSIS — J45909 Unspecified asthma, uncomplicated: Secondary | ICD-10-CM | POA: Diagnosis not present

## 2015-01-02 DIAGNOSIS — M6284 Sarcopenia: Secondary | ICD-10-CM | POA: Diagnosis not present

## 2015-01-02 DIAGNOSIS — S42292A Other displaced fracture of upper end of left humerus, initial encounter for closed fracture: Secondary | ICD-10-CM | POA: Diagnosis not present

## 2015-01-02 DIAGNOSIS — E039 Hypothyroidism, unspecified: Secondary | ICD-10-CM | POA: Diagnosis not present

## 2015-01-02 DIAGNOSIS — M818 Other osteoporosis without current pathological fracture: Secondary | ICD-10-CM | POA: Diagnosis not present

## 2015-01-02 NOTE — Progress Notes (Signed)
Patient ID: Kimberly Greer, female   DOB: [birth year], 81 y.o.   MRN: [id number]    HISTORY AND PHYSICAL  Location:  [facility] Room Number: N 19 Place of Service: SNF (31)   Extended Emergency Contact Information Primary Emergency Contact: Maple,Rilley Stash Address: [place], [place] Phone: [phone] Work Phone: [phone] Mobile Phone: [phone] Relation: Son Secondary Emergency Contact: [place] Phone: [phone] Work Phone: [phone] Relation: Friend  Advanced Directive information Does patient have an advance directive?: Yes, Type of Advance Directive: Living will;Healthcare Power of Attorney, Does patient want to make changes to advanced directive?: No - Patient declined  Chief Complaint  Patient presents with  . New Admit To SNF    Following fracture of the left shoulder and emergency department visit.    HPI:  Patient fell 11/06/2014 and was transported to the emergency room. Evaluation there showed a fracture of the proximal left humerus. She was put in a hanging cast. She has seen Dr. Erlinda Hong, orthopedist. Patient is having little discomfort now. She still has some functional disturbance at the shoulder. She was enlisted in physical therapy and occupational therapy and has progressed well.  Following her initial evaluation, she was found to have a fracture and contracture at the left fourth and fifth fingers.  Patient was initially admitted to the assisted-living area of [facility], but her needs for assistance exceeded the assisted-living guidelines and she was transferred to the skilled nursing facility area on 11/18/2014.  Patient has a mild memory disturbance with fluid last MMSE being 28/30.  There are number of other chronic medical problems which are listed below.  At present, she is unable to walk unassisted. It appears that she  will need long-term care because of her balance disorder and high risk for falls.  Past Medical History  Diagnosis Date  . Asthma   . Hypothyroid   . Senile osteoporosis 06/16/2010  . Other abnormal blood chemistry 06/19/2010    hyperglycemia  . Pain in limb 12/05/2009  . Other and unspecified hyperlipidemia 10/24/2009  . Syncope and collapse 05/30/2009  . Carpal tunnel syndrome 03/07/2009  . Sensorineural hearing loss, unspecified 02/21/2009  . Unspecified nasal polyp 02/21/2009  . Disturbance of skin sensation 02/21/2009  . Unspecified urinary incontinence 02/21/2009  . Diverticulosis of colon (without mention of hemorrhage) 02/16/2009  . 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 Other atopic dermatitis and related conditions 02/16/2009  . Osteoarthrosis, unspecified whether generalized or localized, unspecified site 02/16/2009  . Otosclerosis, unspecified 02/22/2003  . Fracture of upper end of left humerus 11/18/14    Past Surgical History  Procedure Laterality Date  . Breast biopsy  07/16/1990    benign left breast needle biopsy  Dr. Margot Chimes  . Abdominal hysterectomy  1999    Dr. Collier Bullock  . Cataract extraction w/ intraocular lens  implant, bilateral Bilateral   . Carpal tunnel release  04/07/2009    right Dr. Daylene Katayama  . Trigger finger release Left 08/09/2014    Procedure: RELEASE TRIGGER FINGER/A-1 PULLEY LEFT MIDDLE FINGER;  Surgeon: Daryll Brod, MD;  Location: [place];  Service: Orthopedics;  Laterality: Left;  REGIONAL/FAB    Patient Care Team: [name], MD as PCP - General (Internal Medicine) Newton Pigg, MD as Consulting Physician (Obstetrics and Gynecology) Monna Fam, MD as Consulting Physician (Ophthalmology) Orpah Greek  Redmond Baseman, MD as Consulting Physician (Otolaryngology) Myrlene Broker, MD as Consulting Physician (Urology) [place] History  . Marital Status: Widowed    Spouse Name: N/A  . Number of Children: N/A  .  Years of Education: N/A   Occupational History  . retired Sales promotion account executive    Social History Main Topics  . Smoking status: Never Smoker   . Smokeless tobacco: Never Used  . Alcohol Use: No  . Drug Use: No  . Sexual Activity: No   Other Topics Concern  . Not on file   Social History Narrative   Lives at [facility] since 12/06/2008   Widowed 11/2006   Living Will   Walks with cane   Never smoked   Alcohol none   Exercise -walks daily    POA/Living Will    reports that she has never smoked. She has never used smokeless tobacco. She reports that she does not drink alcohol or use illicit drugs.  Family History  Problem Relation Age of Onset  . Cancer Mother     stomach  . Cancer Father     bone  . Dementia Sister   . Cancer Brother     pancreataic  . Cancer Son     pancreatic   Family Status  Relation Status Death Age  . Mother Deceased 3    stomach cancer  . Father Deceased 54    bone cancer  . Sister Deceased 39    dementia  . Brother Deceased     pancreatic cancer  . Son Deceased 83  . Son Alive   . Sister Deceased 27    cause unknown    Immunization History  Administered Date(s) Administered  . Influenza Whole 12/27/2011, 11/05/2012  . Influenza-Unspecified 11/18/2013, 11/03/2014  . PPD Test 04/04/2008  . Pneumococcal Polysaccharide-23 02/05/2008, 06/03/2008  . Td 02/05/2008, 06/03/2008    Allergies  Allergen Reactions  . Avelox [Moxifloxacin Hcl In Nacl] Itching  . Doxycycline   . Hista-Tabs [Triprolidine-Pse] Other (See Comments)    Passed out  . Septra [Sulfamethoxazole-Trimethoprim]   . Sulfa Antibiotics     Itching/rash (all over)    Medications: Patient's Medications  New Prescriptions   No medications on file  Previous Medications   ACETAMINOPHEN (TYLENOL) 325 MG TABLET    Take 2 tablets (650 mg total) by mouth every 6 (six) hours as needed for mild pain or moderate pain.   CALCIUM-VITAMIN D-VITAMIN K (VIACTIV) 563-149-70  MG-UNT-MCG CHEW    Chew 1 tablet by mouth daily. Take 1 tablet daily for calcium.   FLUTICASONE (FLONASE) 50 MCG/ACT NASAL SPRAY    Shake well before each use and instill one to two sprays in each nostril once daily to control congestion   FLUTICASONE-SALMETEROL (ADVAIR DISKUS) 250-50 MCG/DOSE AEPB    Inhale once daily to help breathing   LEVOTHYROXINE (SYNTHROID, LEVOTHROID) 50 MCG TABLET    Take one tablet by mouth once daily for thyroid   MONTELUKAST (SINGULAIR) 10 MG TABLET    Take one tablet by mouth once daily to help breathing   MULTIPLE VITAMINS-MINERALS (MULTIVITAMIN WITH MINERALS) TABLET    Take 1 tablet by mouth daily. Take 1 tablet as a vitamin supplement.   MYRBETRIQ 25 MG TB24 TABLET    TAKE ONE TABLET BY MOUTH ONCE DAILY   OMEPRAZOLE (PRILOSEC) 20 MG CAPSULE    Take 20 mg by mouth daily. Take 1 tablet daily for heartburn or  stomach reflux.   RALOXIFENE (EVISTA) 60 MG TABLET    Take 1 tablet by mouth  daily to treat osteoporosis  Modified Medications   No medications on file  Discontinued Medications   No medications on file    Review of Systems  Constitutional: Positive for fatigue. Negative for fever, appetite change and unexpected weight change.  HENT: Positive for hearing loss. Negative for congestion and nosebleeds.   Eyes:       Corrective lenses  Respiratory: Positive for shortness of breath. Negative for cough, wheezing and stridor.   Cardiovascular: Positive for leg swelling. Negative for chest pain and palpitations.       Trace RLE  Gastrointestinal: Negative.  Negative for nausea, diarrhea and constipation.  Endocrine: Negative for polydipsia.       Hypothyroidism compensated with levothyroxine  Genitourinary: Negative for dysuria, frequency and flank pain.  Musculoskeletal: Positive for gait problem. Negative for myalgias.       Scar right palm from release of 4th finger contracture Left shoulder fx 11/06/14, in sling, f/u Ortho, pain with movement  Skin:  Negative.   Allergic/Immunologic: Negative.   Neurological: Negative for dizziness, tremors, seizures and headaches.  Hematological: Negative.   Psychiatric/Behavioral: Negative.     Filed Vitals:   01/02/15 1627  BP: 106/64  Pulse: 88  Temp: 97.5 F (36.4 C)  Resp: 20  Height: '4\' 11"'  (1.499 m)  Weight: 137 lb (62.143 kg)  SpO2: 94%   Body mass index is 27.66 kg/(m^2).  Physical Exam  Constitutional: She is oriented to person, place, and time. She appears well-developed and well-nourished. No distress.  HENT:  Head: Normocephalic and atraumatic.  Right Ear: External ear normal.  Left Ear: External ear normal.  Nose: Nose normal.  Mouth/Throat: Oropharynx is clear and moist.  Mild hearing deficit  Eyes:  Corrective lenses  Neck: No JVD present. No tracheal deviation present. No thyromegaly present.  Cardiovascular: Normal rate, regular rhythm, normal heart sounds and intact distal pulses.  Exam reveals no gallop and no friction rub.   No murmur heard. Pulmonary/Chest: No respiratory distress. She has no wheezes. She has rales. She exhibits no tenderness.  Abdominal: She exhibits no distension and no mass. There is no tenderness.  Musculoskeletal: She exhibits edema and tenderness.  Unstable gait. Using cane. Right 4th finger contracture has been released. Left shoulder fx 11/06/14, in sling, f/u Ortho, pain with movement, mild edema in the left forearm and hand. Lt hand splint.  Trace edema seen RLE  Lymphadenopathy:    She has no cervical adenopathy.  Neurological: She is alert and oriented to person, place, and time. No cranial nerve deficit. Coordination normal.  10/27/12 MMSE 28/30. Passed clock drawing.  Skin: No rash noted. No erythema. No pallor.  Psychiatric: She has a normal mood and affect. Her behavior is normal. Judgment and thought content normal.    Labs reviewed: Lab Summary Latest Ref Rng 11/28/2014 08/09/2014 02/14/2014  Hemoglobin 12.0 - 16.0 g/dL  11.4(A) 14.0 (None)  Hematocrit 36 - 46 % 35(A) (None) (None)  White count - 7.4 (None) (None)  Platelet count 150 - 399 K/L 363 (None) (None)  Sodium 137 - 147 mmol/L 140 (None) 142  Potassium 3.4 - 5.3 mmol/L 4.5 (None) 4.9  Calcium - (None) (None) (None)  Phosphorus - (None) (None) (None)  Creatinine 0.5 - 1.1 mg/dL 0.8 (None) 0.9  AST 13 - 35 U/L 13 (None) 20  Alk Phos 25 - 125 U/L 92 (None) 62  Bilirubin - (None) (None) (None)  Glucose - 94 (None) 95  Cholesterol 0 - 200 mg/dL (None) (None) 211(A)  HDL cholesterol 35 - 70 mg/dL (None) (None) 47  Triglycerides 40 - 160 mg/dL (None) (None) 155  LDL Direct - (None) (None) (None)  LDL Calc - (None) (None) 133  Total protein - (None) (None) (None)  Albumin - (None) (None) (None)   Lab Results  Component Value Date   BUN 13 11/28/2014   No results found for: HGBA1C Lab Results  Component Value Date   TSH 0.69 11/28/2014     Assessment/Plan   1. Fracture of left shoulder, with routine healing, subsequent encounter Continues to have some problems with abduction and rotation of the left shoulder.  2. Fracture of phalanx of left ring finger, with routine healing, subsequent encounter Currently has a splint attached to the left fourth and fifth fingers. She says this is to try to straighten his fingers out.  3. Abnormality of gait Using 4. walker and assistance to get around. She is not comfortable in moving in her own room.  4. Memory loss Mild.  5. Hypothyroidism, unspecified hypothyroidism type Compensated with levothyroxine.

## 2015-01-03 ENCOUNTER — Encounter: Payer: Self-pay | Admitting: Nurse Practitioner

## 2015-01-03 ENCOUNTER — Non-Acute Institutional Stay (SKILLED_NURSING_FACILITY): Payer: Medicare Other | Admitting: Nurse Practitioner

## 2015-01-03 DIAGNOSIS — K219 Gastro-esophageal reflux disease without esophagitis: Secondary | ICD-10-CM | POA: Diagnosis not present

## 2015-01-03 DIAGNOSIS — E039 Hypothyroidism, unspecified: Secondary | ICD-10-CM | POA: Diagnosis not present

## 2015-01-03 DIAGNOSIS — J4520 Mild intermittent asthma, uncomplicated: Secondary | ICD-10-CM | POA: Diagnosis not present

## 2015-01-03 DIAGNOSIS — R609 Edema, unspecified: Secondary | ICD-10-CM | POA: Diagnosis not present

## 2015-01-03 DIAGNOSIS — S62345S Nondisplaced fracture of base of fourth metacarpal bone, left hand, sequela: Secondary | ICD-10-CM | POA: Diagnosis not present

## 2015-01-03 DIAGNOSIS — M25442 Effusion, left hand: Secondary | ICD-10-CM | POA: Diagnosis not present

## 2015-01-03 DIAGNOSIS — N318 Other neuromuscular dysfunction of bladder: Secondary | ICD-10-CM

## 2015-01-03 DIAGNOSIS — M79645 Pain in left finger(s): Secondary | ICD-10-CM | POA: Diagnosis not present

## 2015-01-03 DIAGNOSIS — M25542 Pain in joints of left hand: Secondary | ICD-10-CM | POA: Diagnosis not present

## 2015-01-03 NOTE — Assessment & Plan Note (Signed)
Last TSH 0.55 02/14/14, 11/28/14 TSH 0.694,  continue Synthroid 50mcg. 

## 2015-01-03 NOTE — Assessment & Plan Note (Signed)
Frequent reported BLE edema, 3+ one time per staff's report, trace edema upon my examination today, she has gained weight gradually, from #137, #138, #139, no noted cough, SOB, PND, sputum production, moist rales in lungs, will weight weekly, update CBC, CMP, BNP.

## 2015-01-03 NOTE — Assessment & Plan Note (Signed)
Stable, continue Flonase, Singulair 10mg daily, Advair, Ventolin HFA 2 puffs q6h prn 

## 2015-01-03 NOTE — Assessment & Plan Note (Signed)
Stable, continue Omeprazole 20mg daily.  

## 2015-01-03 NOTE — Progress Notes (Signed)
Patient ID: Kimberly Greer, female   DOB: Jun 07, 1921, 81 y.o.   MRN: [id number]  Location:  SNF [facility] Provider:  Marlana Latus NP  Code Status:  DNR Goals of care: Advanced Directive information    Chief Complaint  Patient presents with  . Medical Management of Chronic Issues  . Acute Visit    swelling legs.      HPI: Patient is a 81 y.o. female seen in the SNF at [facility] today for evaluation of her chronic medical conditions. Staff reported swelling legs, weights trended up from # 137, 138, 139, BLE trace edema has no change to me, but she admitted she has gained some weight since her right shoulder fx, less active than prior. Denied chest pain, SOB, cough, palpitation, PND,   Hx of hypothyroidism, takes Levothyroxine 62mcg daily, last TSH 0.49 11/28/14, average bathroom trips x2/night while on Myrbetriq 25mg  daily. Left shoulder is less pain, left fingers(left ring finger) are in splint, f/u Ortho, she needs assistance with ADLs due to the fxs of the left shoulder and 4th finger released from contracture.   Review of Systems:  Review of Systems  Constitutional: Negative for fever, weight loss and malaise/fatigue.  HENT: Positive for hearing loss. Negative for congestion and nosebleeds.   Respiratory: Negative for cough, shortness of breath, wheezing and stridor.   Cardiovascular: Positive for leg swelling. Negative for chest pain and palpitations.       Trace BLE  Genitourinary: Negative for dysuria, frequency and flank pain.  Musculoskeletal: Positive for joint pain. Negative for myalgias.       Scar right palm from release of 4th finger contracture Left shoulder fx 11/06/14, in sling, f/u Ortho, pain with movement  Skin: Negative.  Negative for rash.  Neurological: Negative for dizziness, tremors, seizures, weakness and headaches.  Endo/Heme/Allergies: Negative for polydipsia.  Psychiatric/Behavioral: Negative.     Past Medical History  Diagnosis Date  . Asthma   .  Hypothyroid   . Senile osteoporosis 06/16/2010  . Other abnormal blood chemistry 06/19/2010    hyperglycemia  . Pain in limb 12/05/2009  . Other and unspecified hyperlipidemia 10/24/2009  . Syncope and collapse 05/30/2009  . Carpal tunnel syndrome 03/07/2009  . Sensorineural hearing loss, unspecified 02/21/2009  . Unspecified nasal polyp 02/21/2009  . Disturbance of skin sensation 02/21/2009  . Unspecified urinary incontinence 02/21/2009  . Diverticulosis of colon (without mention of hemorrhage) 02/16/2009  . 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 Other atopic dermatitis and related conditions 02/16/2009  . Osteoarthrosis, unspecified whether generalized or localized, unspecified site 02/16/2009  . Otosclerosis, unspecified 02/22/2003  . Fracture of upper end of left humerus 11/18/14    Patient Active Problem List   Diagnosis Date Noted  . Edema 01/03/2015  . Fracture of phalanx of left ring finger 12/06/2014  . GERD (gastroesophageal reflux disease) 11/25/2014  . Osteoporosis 11/25/2014  . Fracture of left shoulder 11/25/2014  . Abnormality of gait 03/08/2014  . Trigger finger, acquired 03/08/2014  . Dyspnea 02/09/2013  . Intrinsic asthma 02/09/2013  . Memory loss 06/23/2012  . Hypothyroid   . Hyperglycemia 06/19/2010  . Hyperlipemia 10/24/2009  . Sensorineural hearing loss, unspecified 02/21/2009  . Urinary incontinence 02/21/2009  . 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 Osteoarthritis 02/16/2009    Allergies  Allergen Reactions  . Avelox [Moxifloxacin Hcl In Nacl] Itching  . Doxycycline   . Hista-Tabs [Triprolidine-Pse] Other (See Comments)    Passed out  . Septra [Sulfamethoxazole-Trimethoprim]   . Sulfa  Antibiotics     Itching/rash (all over)    Medications: Patient's Medications  New Prescriptions   No medications on file  Previous Medications   ACETAMINOPHEN (TYLENOL) 325 MG TABLET    Take 2 tablets (650 mg total) by mouth every 6 (six) hours as needed for mild pain  or moderate pain.   CALCIUM-VITAMIN D-VITAMIN K (VIACTIV) [id number] MG-UNT-MCG CHEW    Chew 1 tablet by mouth daily. Take 1 tablet daily for calcium.   FLUTICASONE (FLONASE) 50 MCG/ACT NASAL SPRAY    Shake well before each use and instill one to two sprays in each nostril once daily to control congestion   FLUTICASONE-SALMETEROL (ADVAIR DISKUS) 250-50 MCG/DOSE AEPB    Inhale once daily to help breathing   LEVOTHYROXINE (SYNTHROID, LEVOTHROID) 50 MCG TABLET    Take one tablet by mouth once daily for thyroid   MONTELUKAST (SINGULAIR) 10 MG TABLET    Take one tablet by mouth once daily to help breathing   MULTIPLE VITAMINS-MINERALS (MULTIVITAMIN WITH MINERALS) TABLET    Take 1 tablet by mouth daily. Take 1 tablet as a vitamin supplement.   MYRBETRIQ 25 MG TB24 TABLET    TAKE ONE TABLET BY MOUTH ONCE DAILY   OMEPRAZOLE (PRILOSEC) 20 MG CAPSULE    Take 20 mg by mouth daily. Take 1 tablet daily for heartburn or stomach reflux.   RALOXIFENE (EVISTA) 60 MG TABLET    Take 1 tablet by mouth  daily to treat osteoporosis  Modified Medications   No medications on file  Discontinued Medications   No medications on file    Physical Exam: Filed Vitals:   01/03/15 1256  BP: 106/64  Pulse: 88  Temp: 99 F (37.2 C)  TempSrc: Tympanic  Resp: 20   There is no weight on file to calculate BMI.  Physical Exam  Constitutional: She is oriented to person, place, and time. She appears well-developed and well-nourished. No distress.  HENT:  Head: Normocephalic and atraumatic.  Right Ear: External ear normal.  Left Ear: External ear normal.  Nose: Nose normal.  Mouth/Throat: Oropharynx is clear and moist.  Mild hearing deficit  Eyes:  Corrective lenses  Neck: No JVD present. No tracheal deviation present. No thyromegaly present.  Cardiovascular: Normal rate, regular rhythm, normal heart sounds and intact distal pulses.  Exam reveals no gallop and no friction rub.   No murmur heard. Pulmonary/Chest: No  respiratory distress. She has no wheezes. She has rales. She exhibits no tenderness.  Abdominal: She exhibits no distension and no mass. There is no tenderness.  Musculoskeletal: She exhibits edema and tenderness.  Unstable gait. Using cane. Right 4th finger contracture has been released. Left shoulder fx 11/06/14, in sling, f/u Ortho, pain with movement, mild edema in the left forearm and hand. Lt hand splint.  Trace edema seen BLE    Lymphadenopathy:    She has no cervical adenopathy.  Neurological: She is alert and oriented to person, place, and time. No cranial nerve deficit. Coordination normal.  10/27/12 MMSE 28/30. Passed clock drawing.  Skin: No rash noted. No erythema. No pallor.  Psychiatric: She has a normal mood and affect. Her behavior is normal. Judgment and thought content normal.    Labs reviewed: Basic Metabolic Panel:  Recent Labs  02/14/14 0803 11/28/14  NA 142 140  K 4.9 4.5  BUN 20 13  CREATININE 0.9 0.8    Liver Function Tests:  Recent Labs  02/14/14 0803 11/28/14  AST 20 13  ALT 17  16  ALKPHOS 62 92    CBC:  Recent Labs  08/09/14 1111 11/28/14  WBC  --  7.4  HGB 14.0 11.4*  HCT  --  35*  PLT  --  363    Lab Results  Component Value Date   TSH 0.69 11/28/2014   No results found for: HGBA1C Lab Results  Component Value Date   CHOL 211* 02/14/2014   HDL 47 02/14/2014   LDLCALC 133 02/14/2014   TRIG 155 02/14/2014    Significant Diagnostic Results since last visit: none  Patient Care Team: [name], MD as PCP - General (Internal Medicine) Newton Pigg, MD as Consulting Physician (Obstetrics and Gynecology) Monna Fam, MD as Consulting Physician (Ophthalmology) Melida Quitter, MD as Consulting Physician (Otolaryngology) Myrlene Broker, MD as Consulting Physician (Urology) [facility]  Assessment/Plan Problem List Items Addressed This Visit    Hypothyroid (Chronic)    Last TSH 0.55 02/14/14, 11/28/14 TSH  0.694,  continue Synthroid 34mcg.      Hypertonicity of bladder    Managed with Myrbetriq, average bathroom trips 2x/night.      Intrinsic asthma    Stable, continue Flonase, Singulair 10mg  daily, Advair, Ventolin HFA 2 puffs q6h prn      GERD (gastroesophageal reflux disease) - Primary    Stable, continue Omeprazole 20mg  daily.       Edema    Frequent reported BLE edema, 3+ one time per staff's report, trace edema upon my examination today, she has gained weight gradually, from #137, #138, #139, no noted cough, SOB, PND, sputum production, moist rales in lungs, will weight weekly, update CBC, CMP, BNP.           Family/ staff Communication: assist the patient with ADLs  Labs/tests ordered: CBC, CMP, BNP  Kimberly Aneya Daddona NP [facility] [address], [place] [address] On Call:  [phone] & follow prompts after 5pm & weekends Office Phone:  [phone] Office Fax:  [phone]

## 2015-01-03 NOTE — Assessment & Plan Note (Signed)
Managed with Myrbetriq, average bathroom trips 2x/night. 

## 2015-01-04 DIAGNOSIS — M6284 Sarcopenia: Secondary | ICD-10-CM | POA: Diagnosis not present

## 2015-01-04 DIAGNOSIS — S42292A Other displaced fracture of upper end of left humerus, initial encounter for closed fracture: Secondary | ICD-10-CM | POA: Diagnosis not present

## 2015-01-04 DIAGNOSIS — R2681 Unsteadiness on feet: Secondary | ICD-10-CM | POA: Diagnosis not present

## 2015-01-04 DIAGNOSIS — M818 Other osteoporosis without current pathological fracture: Secondary | ICD-10-CM | POA: Diagnosis not present

## 2015-01-04 DIAGNOSIS — N3281 Overactive bladder: Secondary | ICD-10-CM | POA: Diagnosis not present

## 2015-01-04 DIAGNOSIS — J45909 Unspecified asthma, uncomplicated: Secondary | ICD-10-CM | POA: Diagnosis not present

## 2015-01-05 DIAGNOSIS — S42292A Other displaced fracture of upper end of left humerus, initial encounter for closed fracture: Secondary | ICD-10-CM | POA: Diagnosis not present

## 2015-01-05 DIAGNOSIS — R55 Syncope and collapse: Secondary | ICD-10-CM | POA: Diagnosis not present

## 2015-01-05 DIAGNOSIS — J45909 Unspecified asthma, uncomplicated: Secondary | ICD-10-CM | POA: Diagnosis not present

## 2015-01-05 DIAGNOSIS — R2681 Unsteadiness on feet: Secondary | ICD-10-CM | POA: Diagnosis not present

## 2015-01-05 DIAGNOSIS — R609 Edema, unspecified: Secondary | ICD-10-CM | POA: Diagnosis not present

## 2015-01-05 DIAGNOSIS — M818 Other osteoporosis without current pathological fracture: Secondary | ICD-10-CM | POA: Diagnosis not present

## 2015-01-05 DIAGNOSIS — N3281 Overactive bladder: Secondary | ICD-10-CM | POA: Diagnosis not present

## 2015-01-05 DIAGNOSIS — R413 Other amnesia: Secondary | ICD-10-CM | POA: Diagnosis not present

## 2015-01-05 DIAGNOSIS — I5023 Acute on chronic systolic (congestive) heart failure: Secondary | ICD-10-CM | POA: Diagnosis not present

## 2015-01-05 DIAGNOSIS — M6281 Muscle weakness (generalized): Secondary | ICD-10-CM | POA: Diagnosis not present

## 2015-01-05 LAB — BASIC METABOLIC PANEL
BUN: 19 mg/dL (ref 4–21)
Creatinine: 0.8 mg/dL (ref 0.5–1.1)
GLUCOSE: 90 mg/dL
Potassium: 4.3 mmol/L (ref 3.4–5.3)
Sodium: 141 mmol/L (ref 137–147)

## 2015-01-05 LAB — CBC AND DIFFERENTIAL
HEMATOCRIT: 38 % (ref 36–46)
HEMOGLOBIN: 12.5 g/dL (ref 12.0–16.0)
PLATELETS: 307 10*3/uL (ref 150–399)
WBC: 6.7 10*3/mL

## 2015-01-05 LAB — HEPATIC FUNCTION PANEL
ALT: 17 U/L (ref 7–35)
AST: 16 U/L (ref 13–35)
Alkaline Phosphatase: 63 U/L (ref 25–125)
BILIRUBIN, TOTAL: 0.3 mg/dL

## 2015-01-06 ENCOUNTER — Non-Acute Institutional Stay (SKILLED_NURSING_FACILITY): Payer: Medicare Other | Admitting: Nurse Practitioner

## 2015-01-06 ENCOUNTER — Encounter: Payer: Self-pay | Admitting: Nurse Practitioner

## 2015-01-06 DIAGNOSIS — R609 Edema, unspecified: Secondary | ICD-10-CM | POA: Diagnosis not present

## 2015-01-06 DIAGNOSIS — F4321 Adjustment disorder with depressed mood: Secondary | ICD-10-CM | POA: Diagnosis not present

## 2015-01-06 DIAGNOSIS — J4520 Mild intermittent asthma, uncomplicated: Secondary | ICD-10-CM

## 2015-01-06 DIAGNOSIS — S42292A Other displaced fracture of upper end of left humerus, initial encounter for closed fracture: Secondary | ICD-10-CM | POA: Diagnosis not present

## 2015-01-06 DIAGNOSIS — N3281 Overactive bladder: Secondary | ICD-10-CM | POA: Diagnosis not present

## 2015-01-06 DIAGNOSIS — J45909 Unspecified asthma, uncomplicated: Secondary | ICD-10-CM | POA: Diagnosis not present

## 2015-01-06 DIAGNOSIS — N318 Other neuromuscular dysfunction of bladder: Secondary | ICD-10-CM | POA: Diagnosis not present

## 2015-01-06 DIAGNOSIS — M818 Other osteoporosis without current pathological fracture: Secondary | ICD-10-CM | POA: Diagnosis not present

## 2015-01-06 DIAGNOSIS — E039 Hypothyroidism, unspecified: Secondary | ICD-10-CM | POA: Diagnosis not present

## 2015-01-06 DIAGNOSIS — M6281 Muscle weakness (generalized): Secondary | ICD-10-CM | POA: Diagnosis not present

## 2015-01-06 DIAGNOSIS — K219 Gastro-esophageal reflux disease without esophagitis: Secondary | ICD-10-CM | POA: Diagnosis not present

## 2015-01-06 DIAGNOSIS — R2681 Unsteadiness on feet: Secondary | ICD-10-CM | POA: Diagnosis not present

## 2015-01-06 NOTE — Progress Notes (Signed)
Patient ID: Kimberly Greer, female   DOB: [birth year], 81 y.o.   MRN: [id number]  Location:  SNF [facility] Provider:  Marlana Latus NP  Code Status:  DNR Goals of care: Advanced Directive information    Chief Complaint  Patient presents with  . Medical Management of Chronic Issues  . Acute Visit    depression     HPI: Patient is a 81 y.o. female seen in the SNF at [facility] today for evaluation of depression, reported the patient stated she is more depressed, wants to die,  she sated had a good life, want to die, but has no suicidal ideation, to me this seems to be related to her recent left shoulder fx and related ADL dependency, she doesn't want continue therapy for her LUE due to pain, she doesn't want take anything stronger than Tylenol prior to therapy.  Staff reported swelling legs, weights trended up from # 137, 138, 139, BLE trace edema has no change to me, BNP 22.1 01/05/15, but she admitted she has gained some weight since her right shoulder fx, less active than prior. Denied chest pain, SOB, cough, palpitation, PND,   Hx of hypothyroidism, takes Levothyroxine 28mcg daily, last TSH 0.49 11/28/14, average bathroom trips x2/night while on Myrbetriq 25mg  daily. Left shoulder is less pain, left fingers(left ring finger) are in splint, f/u Ortho, she needs assistance with ADLs due to the fxs of the left shoulder and 4th finger released from contracture.   Review of Systems:  Review of Systems  Constitutional: Negative for fever, weight loss and malaise/fatigue.  HENT: Positive for hearing loss. Negative for congestion and nosebleeds.   Respiratory: Negative for cough, shortness of breath, wheezing and stridor.   Cardiovascular: Positive for leg swelling. Negative for chest pain and palpitations.       Trace BLE  Genitourinary: Negative for dysuria, frequency and flank pain.  Musculoskeletal: Positive for joint pain. Negative for myalgias.       Scar right palm from release of 4th  finger contracture Left shoulder fx 11/06/14, in sling, f/u Ortho, pain with movement  Skin: Negative.  Negative for rash.  Neurological: Negative for dizziness, tremors, seizures, weakness and headaches.  Endo/Heme/Allergies: Negative for polydipsia.  Psychiatric/Behavioral: Positive for depression.    Past Medical History  Diagnosis Date  . Asthma   . Hypothyroid   . Senile osteoporosis 06/16/2010  . Other abnormal blood chemistry 06/19/2010    hyperglycemia  . Pain in limb 12/05/2009  . Other and unspecified hyperlipidemia 10/24/2009  . Syncope and collapse 05/30/2009  . Carpal tunnel syndrome 03/07/2009  . Sensorineural hearing loss, unspecified 02/21/2009  . Unspecified nasal polyp 02/21/2009  . Disturbance of skin sensation 02/21/2009  . Unspecified urinary incontinence 02/21/2009  . Diverticulosis of colon (without mention of hemorrhage) 02/16/2009  . 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 Other atopic dermatitis and related conditions 02/16/2009  . Osteoarthrosis, unspecified whether generalized or localized, unspecified site 02/16/2009  . Otosclerosis, unspecified 02/22/2003  . Fracture of upper end of left humerus 11/18/14    Patient Active Problem List   Diagnosis Date Noted  . Situational depression 01/06/2015  . Edema 01/03/2015  . Fracture of phalanx of left ring finger 12/06/2014  . GERD (gastroesophageal reflux disease) 11/25/2014  . Osteoporosis 11/25/2014  . Fracture of left shoulder 11/25/2014  . Abnormality of gait 03/08/2014  . Trigger finger, acquired 03/08/2014  . Dyspnea 02/09/2013  . Intrinsic asthma 02/09/2013  . Memory loss 06/23/2012  . Hypothyroid   .  Hyperglycemia 06/19/2010  . Hyperlipemia 10/24/2009  . Sensorineural hearing loss, unspecified 02/21/2009  . Urinary incontinence 02/21/2009  . 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 Osteoarthritis 02/16/2009    Allergies  Allergen Reactions  . Avelox [Moxifloxacin Hcl In Nacl] Itching  . Doxycycline     . Hista-Tabs [Triprolidine-Pse] Other (See Comments)    Passed out  . Septra [Sulfamethoxazole-Trimethoprim]   . Sulfa Antibiotics     Itching/rash (all over)    Medications: Patient's Medications  New Prescriptions   No medications on file  Previous Medications   ACETAMINOPHEN (TYLENOL) 325 MG TABLET    Take 2 tablets (650 mg total) by mouth every 6 (six) hours as needed for mild pain or moderate pain.   CALCIUM-VITAMIN D-VITAMIN K (VIACTIV) [id number] MG-UNT-MCG CHEW    Chew 1 tablet by mouth daily. Take 1 tablet daily for calcium.   FLUTICASONE (FLONASE) 50 MCG/ACT NASAL SPRAY    Shake well before each use and instill one to two sprays in each nostril once daily to control congestion   FLUTICASONE-SALMETEROL (ADVAIR DISKUS) 250-50 MCG/DOSE AEPB    Inhale once daily to help breathing   LEVOTHYROXINE (SYNTHROID, LEVOTHROID) 50 MCG TABLET    Take one tablet by mouth once daily for thyroid   MONTELUKAST (SINGULAIR) 10 MG TABLET    Take one tablet by mouth once daily to help breathing   MULTIPLE VITAMINS-MINERALS (MULTIVITAMIN WITH MINERALS) TABLET    Take 1 tablet by mouth daily. Take 1 tablet as a vitamin supplement.   MYRBETRIQ 25 MG TB24 TABLET    TAKE ONE TABLET BY MOUTH ONCE DAILY   OMEPRAZOLE (PRILOSEC) 20 MG CAPSULE    Take 20 mg by mouth daily. Take 1 tablet daily for heartburn or stomach reflux.   RALOXIFENE (EVISTA) 60 MG TABLET    Take 1 tablet by mouth  daily to treat osteoporosis  Modified Medications   No medications on file  Discontinued Medications   No medications on file    Physical Exam: Filed Vitals:   01/06/15 1143  BP: 129/72  Pulse: 98  Temp: 97.7 F (36.5 C)  TempSrc: Tympanic  Resp: 20   There is no weight on file to calculate BMI.  Physical Exam  Constitutional: She is oriented to person, place, and time. She appears well-developed and well-nourished. No distress.  HENT:  Head: Normocephalic and atraumatic.  Right Ear: External ear normal.  Left  Ear: External ear normal.  Nose: Nose normal.  Mouth/Throat: Oropharynx is clear and moist.  Mild hearing deficit  Eyes:  Corrective lenses  Neck: No JVD present. No tracheal deviation present. No thyromegaly present.  Cardiovascular: Normal rate, regular rhythm, normal heart sounds and intact distal pulses.  Exam reveals no gallop and no friction rub.   No murmur heard. Pulmonary/Chest: No respiratory distress. She has no wheezes. She has rales. She exhibits no tenderness.  Abdominal: She exhibits no distension and no mass. There is no tenderness.  Musculoskeletal: She exhibits edema and tenderness.  Unstable gait. Using cane. Right 4th finger contracture has been released. Left shoulder fx 11/06/14, in sling, f/u Ortho, pain with movement, mild edema in the left forearm and hand. Lt hand splint.  Trace edema seen BLE    Lymphadenopathy:    She has no cervical adenopathy.  Neurological: She is alert and oriented to person, place, and time. No cranial nerve deficit. Coordination normal.  10/27/12 MMSE 28/30. Passed clock drawing.  Skin: No rash noted. No erythema. No pallor.  Psychiatric: She has a normal mood and affect. Her behavior is normal. Judgment and thought content normal.    Labs reviewed: Basic Metabolic Panel:  Recent Labs  02/14/14 0803 11/28/14 01/05/15  NA 142 140 141  K 4.9 4.5 4.3  BUN 20 13 19   CREATININE 0.9 0.8 0.8    Liver Function Tests:  Recent Labs  02/14/14 0803 11/28/14 01/05/15  AST 20 13 16   ALT 17 16 17   ALKPHOS 62 92 63    CBC:  Recent Labs  08/09/14 1111 11/28/14 01/05/15  WBC  --  7.4 6.7  HGB 14.0 11.4* 12.5  HCT  --  35* 38  PLT  --  363 307    Lab Results  Component Value Date   TSH 0.69 11/28/2014   No results found for: HGBA1C Lab Results  Component Value Date   CHOL 211* 02/14/2014   HDL 47 02/14/2014   LDLCALC 133 02/14/2014   TRIG 155 02/14/2014    Significant Diagnostic Results since last visit:  none  Patient Care Team: [name], MD as PCP - General (Internal Medicine) Newton Pigg, MD as Consulting Physician (Obstetrics and Gynecology) Monna Fam, MD as Consulting Physician (Ophthalmology) Melida Quitter, MD as Consulting Physician (Otolaryngology) Myrlene Broker, MD as Consulting Physician (Urology) [facility]  Assessment/Plan Problem List Items Addressed This Visit    Hypothyroid (Chronic)    11/28/14 TSH 0.694,  continue Synthroid 54mcg.      Hypertonicity of bladder    Managed with Myrbetriq, average bathroom trips 2x/night.      Intrinsic asthma    Stable, continue Flonase, Singulair 10mg  daily, Advair, Ventolin HFA 2 puffs q6h prn      GERD (gastroesophageal reflux disease)    Stable, continue Omeprazole 20mg  daily.       Edema    Trace dependent edema BLE. BNP 22.1 01/05/15      Situational depression - Primary    01/06/15 refused Lexapro 5mg  daily, no suicidal ideation, stated she had a good life, no desire for painful therapy of the LUE, didn't want to take anything stronger than Tylenol, willing to continue SNF for care needs. She desires continuation of the rest of PT/OT but not involved LUE          Family/ staff Communication: assist the patient with ADLs  Labs/tests ordered: none  [facility] Martese Vanatta NP [facility] [address], [place] [address] On Call:  [phone] & follow prompts after 5pm & weekends Office Phone:  [phone] Office Fax:  [phone]

## 2015-01-06 NOTE — Assessment & Plan Note (Addendum)
01/06/15 refused Lexapro 5mg  daily, no suicidal ideation, stated she had a good life, no desire for painful therapy of the LUE, didn't want to take anything stronger than Tylenol, willing to continue SNF for care needs. She desires continuation of the rest of PT/OT but not involved LUE

## 2015-01-06 NOTE — Assessment & Plan Note (Signed)
Trace dependent edema BLE. BNP 22.1 01/05/15

## 2015-01-06 NOTE — Assessment & Plan Note (Signed)
11/28/14 TSH 0.694,  continue Synthroid 22mcg.

## 2015-01-06 NOTE — Assessment & Plan Note (Signed)
Stable, continue Omeprazole 20mg daily.  

## 2015-01-06 NOTE — Assessment & Plan Note (Signed)
Stable, continue Flonase, Singulair 10mg daily, Advair, Ventolin HFA 2 puffs q6h prn 

## 2015-01-06 NOTE — Assessment & Plan Note (Signed)
Managed with Myrbetriq, average bathroom trips 2x/night. 

## 2015-01-09 DIAGNOSIS — J45909 Unspecified asthma, uncomplicated: Secondary | ICD-10-CM | POA: Diagnosis not present

## 2015-01-09 DIAGNOSIS — R2681 Unsteadiness on feet: Secondary | ICD-10-CM | POA: Diagnosis not present

## 2015-01-09 DIAGNOSIS — S42292A Other displaced fracture of upper end of left humerus, initial encounter for closed fracture: Secondary | ICD-10-CM | POA: Diagnosis not present

## 2015-01-09 DIAGNOSIS — M6281 Muscle weakness (generalized): Secondary | ICD-10-CM | POA: Diagnosis not present

## 2015-01-09 DIAGNOSIS — M818 Other osteoporosis without current pathological fracture: Secondary | ICD-10-CM | POA: Diagnosis not present

## 2015-01-09 DIAGNOSIS — N3281 Overactive bladder: Secondary | ICD-10-CM | POA: Diagnosis not present

## 2015-01-10 DIAGNOSIS — M25442 Effusion, left hand: Secondary | ICD-10-CM | POA: Diagnosis not present

## 2015-01-10 DIAGNOSIS — M818 Other osteoporosis without current pathological fracture: Secondary | ICD-10-CM | POA: Diagnosis not present

## 2015-01-10 DIAGNOSIS — R2681 Unsteadiness on feet: Secondary | ICD-10-CM | POA: Diagnosis not present

## 2015-01-10 DIAGNOSIS — M79645 Pain in left finger(s): Secondary | ICD-10-CM | POA: Diagnosis not present

## 2015-01-10 DIAGNOSIS — N3281 Overactive bladder: Secondary | ICD-10-CM | POA: Diagnosis not present

## 2015-01-10 DIAGNOSIS — M25542 Pain in joints of left hand: Secondary | ICD-10-CM | POA: Diagnosis not present

## 2015-01-10 DIAGNOSIS — S62345S Nondisplaced fracture of base of fourth metacarpal bone, left hand, sequela: Secondary | ICD-10-CM | POA: Diagnosis not present

## 2015-01-10 DIAGNOSIS — M6281 Muscle weakness (generalized): Secondary | ICD-10-CM | POA: Diagnosis not present

## 2015-01-10 DIAGNOSIS — J45909 Unspecified asthma, uncomplicated: Secondary | ICD-10-CM | POA: Diagnosis not present

## 2015-01-10 DIAGNOSIS — S42292A Other displaced fracture of upper end of left humerus, initial encounter for closed fracture: Secondary | ICD-10-CM | POA: Diagnosis not present

## 2015-01-12 DIAGNOSIS — S42292A Other displaced fracture of upper end of left humerus, initial encounter for closed fracture: Secondary | ICD-10-CM | POA: Diagnosis not present

## 2015-01-12 DIAGNOSIS — M818 Other osteoporosis without current pathological fracture: Secondary | ICD-10-CM | POA: Diagnosis not present

## 2015-01-12 DIAGNOSIS — N3281 Overactive bladder: Secondary | ICD-10-CM | POA: Diagnosis not present

## 2015-01-12 DIAGNOSIS — J45909 Unspecified asthma, uncomplicated: Secondary | ICD-10-CM | POA: Diagnosis not present

## 2015-01-12 DIAGNOSIS — M6281 Muscle weakness (generalized): Secondary | ICD-10-CM | POA: Diagnosis not present

## 2015-01-12 DIAGNOSIS — R2681 Unsteadiness on feet: Secondary | ICD-10-CM | POA: Diagnosis not present

## 2015-01-13 DIAGNOSIS — R2681 Unsteadiness on feet: Secondary | ICD-10-CM | POA: Diagnosis not present

## 2015-01-13 DIAGNOSIS — S42292A Other displaced fracture of upper end of left humerus, initial encounter for closed fracture: Secondary | ICD-10-CM | POA: Diagnosis not present

## 2015-01-13 DIAGNOSIS — M6281 Muscle weakness (generalized): Secondary | ICD-10-CM | POA: Diagnosis not present

## 2015-01-13 DIAGNOSIS — M818 Other osteoporosis without current pathological fracture: Secondary | ICD-10-CM | POA: Diagnosis not present

## 2015-01-13 DIAGNOSIS — N3281 Overactive bladder: Secondary | ICD-10-CM | POA: Diagnosis not present

## 2015-01-13 DIAGNOSIS — J45909 Unspecified asthma, uncomplicated: Secondary | ICD-10-CM | POA: Diagnosis not present

## 2015-01-16 DIAGNOSIS — M6281 Muscle weakness (generalized): Secondary | ICD-10-CM | POA: Diagnosis not present

## 2015-01-16 DIAGNOSIS — N3281 Overactive bladder: Secondary | ICD-10-CM | POA: Diagnosis not present

## 2015-01-16 DIAGNOSIS — J45909 Unspecified asthma, uncomplicated: Secondary | ICD-10-CM | POA: Diagnosis not present

## 2015-01-16 DIAGNOSIS — M818 Other osteoporosis without current pathological fracture: Secondary | ICD-10-CM | POA: Diagnosis not present

## 2015-01-16 DIAGNOSIS — R2681 Unsteadiness on feet: Secondary | ICD-10-CM | POA: Diagnosis not present

## 2015-01-16 DIAGNOSIS — S42292A Other displaced fracture of upper end of left humerus, initial encounter for closed fracture: Secondary | ICD-10-CM | POA: Diagnosis not present

## 2015-01-17 DIAGNOSIS — S42292A Other displaced fracture of upper end of left humerus, initial encounter for closed fracture: Secondary | ICD-10-CM | POA: Diagnosis not present

## 2015-01-17 DIAGNOSIS — N3281 Overactive bladder: Secondary | ICD-10-CM | POA: Diagnosis not present

## 2015-01-17 DIAGNOSIS — M818 Other osteoporosis without current pathological fracture: Secondary | ICD-10-CM | POA: Diagnosis not present

## 2015-01-17 DIAGNOSIS — R2681 Unsteadiness on feet: Secondary | ICD-10-CM | POA: Diagnosis not present

## 2015-01-17 DIAGNOSIS — M6281 Muscle weakness (generalized): Secondary | ICD-10-CM | POA: Diagnosis not present

## 2015-01-17 DIAGNOSIS — J45909 Unspecified asthma, uncomplicated: Secondary | ICD-10-CM | POA: Diagnosis not present

## 2015-01-18 DIAGNOSIS — R2681 Unsteadiness on feet: Secondary | ICD-10-CM | POA: Diagnosis not present

## 2015-01-18 DIAGNOSIS — N3281 Overactive bladder: Secondary | ICD-10-CM | POA: Diagnosis not present

## 2015-01-18 DIAGNOSIS — S42292A Other displaced fracture of upper end of left humerus, initial encounter for closed fracture: Secondary | ICD-10-CM | POA: Diagnosis not present

## 2015-01-18 DIAGNOSIS — M818 Other osteoporosis without current pathological fracture: Secondary | ICD-10-CM | POA: Diagnosis not present

## 2015-01-18 DIAGNOSIS — M6281 Muscle weakness (generalized): Secondary | ICD-10-CM | POA: Diagnosis not present

## 2015-01-18 DIAGNOSIS — J45909 Unspecified asthma, uncomplicated: Secondary | ICD-10-CM | POA: Diagnosis not present

## 2015-01-19 DIAGNOSIS — M6281 Muscle weakness (generalized): Secondary | ICD-10-CM | POA: Diagnosis not present

## 2015-01-19 DIAGNOSIS — S42292A Other displaced fracture of upper end of left humerus, initial encounter for closed fracture: Secondary | ICD-10-CM | POA: Diagnosis not present

## 2015-01-19 DIAGNOSIS — N3281 Overactive bladder: Secondary | ICD-10-CM | POA: Diagnosis not present

## 2015-01-19 DIAGNOSIS — R2681 Unsteadiness on feet: Secondary | ICD-10-CM | POA: Diagnosis not present

## 2015-01-19 DIAGNOSIS — J45909 Unspecified asthma, uncomplicated: Secondary | ICD-10-CM | POA: Diagnosis not present

## 2015-01-19 DIAGNOSIS — M818 Other osteoporosis without current pathological fracture: Secondary | ICD-10-CM | POA: Diagnosis not present

## 2015-01-23 DIAGNOSIS — J45909 Unspecified asthma, uncomplicated: Secondary | ICD-10-CM | POA: Diagnosis not present

## 2015-01-23 DIAGNOSIS — S42292A Other displaced fracture of upper end of left humerus, initial encounter for closed fracture: Secondary | ICD-10-CM | POA: Diagnosis not present

## 2015-01-23 DIAGNOSIS — M6281 Muscle weakness (generalized): Secondary | ICD-10-CM | POA: Diagnosis not present

## 2015-01-23 DIAGNOSIS — M818 Other osteoporosis without current pathological fracture: Secondary | ICD-10-CM | POA: Diagnosis not present

## 2015-01-23 DIAGNOSIS — N3281 Overactive bladder: Secondary | ICD-10-CM | POA: Diagnosis not present

## 2015-01-23 DIAGNOSIS — R2681 Unsteadiness on feet: Secondary | ICD-10-CM | POA: Diagnosis not present

## 2015-01-24 DIAGNOSIS — R2681 Unsteadiness on feet: Secondary | ICD-10-CM | POA: Diagnosis not present

## 2015-01-24 DIAGNOSIS — J45909 Unspecified asthma, uncomplicated: Secondary | ICD-10-CM | POA: Diagnosis not present

## 2015-01-24 DIAGNOSIS — S42292A Other displaced fracture of upper end of left humerus, initial encounter for closed fracture: Secondary | ICD-10-CM | POA: Diagnosis not present

## 2015-01-24 DIAGNOSIS — M818 Other osteoporosis without current pathological fracture: Secondary | ICD-10-CM | POA: Diagnosis not present

## 2015-01-24 DIAGNOSIS — N3281 Overactive bladder: Secondary | ICD-10-CM | POA: Diagnosis not present

## 2015-01-24 DIAGNOSIS — M6281 Muscle weakness (generalized): Secondary | ICD-10-CM | POA: Diagnosis not present

## 2015-01-26 DIAGNOSIS — N3281 Overactive bladder: Secondary | ICD-10-CM | POA: Diagnosis not present

## 2015-01-26 DIAGNOSIS — J45909 Unspecified asthma, uncomplicated: Secondary | ICD-10-CM | POA: Diagnosis not present

## 2015-01-26 DIAGNOSIS — R2681 Unsteadiness on feet: Secondary | ICD-10-CM | POA: Diagnosis not present

## 2015-01-26 DIAGNOSIS — S42292A Other displaced fracture of upper end of left humerus, initial encounter for closed fracture: Secondary | ICD-10-CM | POA: Diagnosis not present

## 2015-01-26 DIAGNOSIS — M6281 Muscle weakness (generalized): Secondary | ICD-10-CM | POA: Diagnosis not present

## 2015-01-26 DIAGNOSIS — M818 Other osteoporosis without current pathological fracture: Secondary | ICD-10-CM | POA: Diagnosis not present

## 2015-01-27 DIAGNOSIS — S42292A Other displaced fracture of upper end of left humerus, initial encounter for closed fracture: Secondary | ICD-10-CM | POA: Diagnosis not present

## 2015-01-27 DIAGNOSIS — J45909 Unspecified asthma, uncomplicated: Secondary | ICD-10-CM | POA: Diagnosis not present

## 2015-01-27 DIAGNOSIS — N3281 Overactive bladder: Secondary | ICD-10-CM | POA: Diagnosis not present

## 2015-01-27 DIAGNOSIS — M6281 Muscle weakness (generalized): Secondary | ICD-10-CM | POA: Diagnosis not present

## 2015-01-27 DIAGNOSIS — R2681 Unsteadiness on feet: Secondary | ICD-10-CM | POA: Diagnosis not present

## 2015-01-27 DIAGNOSIS — M818 Other osteoporosis without current pathological fracture: Secondary | ICD-10-CM | POA: Diagnosis not present

## 2015-01-30 DIAGNOSIS — N3281 Overactive bladder: Secondary | ICD-10-CM | POA: Diagnosis not present

## 2015-01-30 DIAGNOSIS — S42292A Other displaced fracture of upper end of left humerus, initial encounter for closed fracture: Secondary | ICD-10-CM | POA: Diagnosis not present

## 2015-01-30 DIAGNOSIS — M818 Other osteoporosis without current pathological fracture: Secondary | ICD-10-CM | POA: Diagnosis not present

## 2015-01-30 DIAGNOSIS — R2681 Unsteadiness on feet: Secondary | ICD-10-CM | POA: Diagnosis not present

## 2015-01-30 DIAGNOSIS — M6281 Muscle weakness (generalized): Secondary | ICD-10-CM | POA: Diagnosis not present

## 2015-01-30 DIAGNOSIS — J45909 Unspecified asthma, uncomplicated: Secondary | ICD-10-CM | POA: Diagnosis not present

## 2015-01-31 DIAGNOSIS — R2681 Unsteadiness on feet: Secondary | ICD-10-CM | POA: Diagnosis not present

## 2015-01-31 DIAGNOSIS — M818 Other osteoporosis without current pathological fracture: Secondary | ICD-10-CM | POA: Diagnosis not present

## 2015-01-31 DIAGNOSIS — J45909 Unspecified asthma, uncomplicated: Secondary | ICD-10-CM | POA: Diagnosis not present

## 2015-01-31 DIAGNOSIS — N3281 Overactive bladder: Secondary | ICD-10-CM | POA: Diagnosis not present

## 2015-01-31 DIAGNOSIS — S42292A Other displaced fracture of upper end of left humerus, initial encounter for closed fracture: Secondary | ICD-10-CM | POA: Diagnosis not present

## 2015-01-31 DIAGNOSIS — M6281 Muscle weakness (generalized): Secondary | ICD-10-CM | POA: Diagnosis not present

## 2015-02-01 DIAGNOSIS — N3281 Overactive bladder: Secondary | ICD-10-CM | POA: Diagnosis not present

## 2015-02-01 DIAGNOSIS — M818 Other osteoporosis without current pathological fracture: Secondary | ICD-10-CM | POA: Diagnosis not present

## 2015-02-01 DIAGNOSIS — R2681 Unsteadiness on feet: Secondary | ICD-10-CM | POA: Diagnosis not present

## 2015-02-01 DIAGNOSIS — S42292A Other displaced fracture of upper end of left humerus, initial encounter for closed fracture: Secondary | ICD-10-CM | POA: Diagnosis not present

## 2015-02-01 DIAGNOSIS — J45909 Unspecified asthma, uncomplicated: Secondary | ICD-10-CM | POA: Diagnosis not present

## 2015-02-01 DIAGNOSIS — M6281 Muscle weakness (generalized): Secondary | ICD-10-CM | POA: Diagnosis not present

## 2015-02-03 DIAGNOSIS — J45909 Unspecified asthma, uncomplicated: Secondary | ICD-10-CM | POA: Diagnosis not present

## 2015-02-03 DIAGNOSIS — S42292A Other displaced fracture of upper end of left humerus, initial encounter for closed fracture: Secondary | ICD-10-CM | POA: Diagnosis not present

## 2015-02-03 DIAGNOSIS — M818 Other osteoporosis without current pathological fracture: Secondary | ICD-10-CM | POA: Diagnosis not present

## 2015-02-03 DIAGNOSIS — M6281 Muscle weakness (generalized): Secondary | ICD-10-CM | POA: Diagnosis not present

## 2015-02-03 DIAGNOSIS — N3281 Overactive bladder: Secondary | ICD-10-CM | POA: Diagnosis not present

## 2015-02-03 DIAGNOSIS — R2681 Unsteadiness on feet: Secondary | ICD-10-CM | POA: Diagnosis not present

## 2015-02-06 DIAGNOSIS — M6281 Muscle weakness (generalized): Secondary | ICD-10-CM | POA: Diagnosis not present

## 2015-02-06 DIAGNOSIS — J45909 Unspecified asthma, uncomplicated: Secondary | ICD-10-CM | POA: Diagnosis not present

## 2015-02-06 DIAGNOSIS — M818 Other osteoporosis without current pathological fracture: Secondary | ICD-10-CM | POA: Diagnosis not present

## 2015-02-06 DIAGNOSIS — S42292A Other displaced fracture of upper end of left humerus, initial encounter for closed fracture: Secondary | ICD-10-CM | POA: Diagnosis not present

## 2015-02-06 DIAGNOSIS — R2681 Unsteadiness on feet: Secondary | ICD-10-CM | POA: Diagnosis not present

## 2015-02-06 DIAGNOSIS — N3281 Overactive bladder: Secondary | ICD-10-CM | POA: Diagnosis not present

## 2015-02-06 DIAGNOSIS — R55 Syncope and collapse: Secondary | ICD-10-CM | POA: Diagnosis not present

## 2015-02-08 DIAGNOSIS — M818 Other osteoporosis without current pathological fracture: Secondary | ICD-10-CM | POA: Diagnosis not present

## 2015-02-08 DIAGNOSIS — N3281 Overactive bladder: Secondary | ICD-10-CM | POA: Diagnosis not present

## 2015-02-08 DIAGNOSIS — R2681 Unsteadiness on feet: Secondary | ICD-10-CM | POA: Diagnosis not present

## 2015-02-08 DIAGNOSIS — M6281 Muscle weakness (generalized): Secondary | ICD-10-CM | POA: Diagnosis not present

## 2015-02-08 DIAGNOSIS — S42292A Other displaced fracture of upper end of left humerus, initial encounter for closed fracture: Secondary | ICD-10-CM | POA: Diagnosis not present

## 2015-02-08 DIAGNOSIS — J45909 Unspecified asthma, uncomplicated: Secondary | ICD-10-CM | POA: Diagnosis not present

## 2015-02-09 DIAGNOSIS — N3281 Overactive bladder: Secondary | ICD-10-CM | POA: Diagnosis not present

## 2015-02-09 DIAGNOSIS — R2681 Unsteadiness on feet: Secondary | ICD-10-CM | POA: Diagnosis not present

## 2015-02-09 DIAGNOSIS — S42292A Other displaced fracture of upper end of left humerus, initial encounter for closed fracture: Secondary | ICD-10-CM | POA: Diagnosis not present

## 2015-02-09 DIAGNOSIS — J45909 Unspecified asthma, uncomplicated: Secondary | ICD-10-CM | POA: Diagnosis not present

## 2015-02-09 DIAGNOSIS — M818 Other osteoporosis without current pathological fracture: Secondary | ICD-10-CM | POA: Diagnosis not present

## 2015-02-09 DIAGNOSIS — M6281 Muscle weakness (generalized): Secondary | ICD-10-CM | POA: Diagnosis not present

## 2015-02-10 ENCOUNTER — Non-Acute Institutional Stay (SKILLED_NURSING_FACILITY): Payer: Medicare Other | Admitting: Nurse Practitioner

## 2015-02-10 ENCOUNTER — Encounter: Payer: Self-pay | Admitting: Nurse Practitioner

## 2015-02-10 DIAGNOSIS — R2681 Unsteadiness on feet: Secondary | ICD-10-CM | POA: Diagnosis not present

## 2015-02-10 DIAGNOSIS — N318 Other neuromuscular dysfunction of bladder: Secondary | ICD-10-CM

## 2015-02-10 DIAGNOSIS — K219 Gastro-esophageal reflux disease without esophagitis: Secondary | ICD-10-CM | POA: Diagnosis not present

## 2015-02-10 DIAGNOSIS — F4321 Adjustment disorder with depressed mood: Secondary | ICD-10-CM | POA: Diagnosis not present

## 2015-02-10 DIAGNOSIS — S42292A Other displaced fracture of upper end of left humerus, initial encounter for closed fracture: Secondary | ICD-10-CM | POA: Diagnosis not present

## 2015-02-10 DIAGNOSIS — E039 Hypothyroidism, unspecified: Secondary | ICD-10-CM

## 2015-02-10 DIAGNOSIS — M6281 Muscle weakness (generalized): Secondary | ICD-10-CM | POA: Diagnosis not present

## 2015-02-10 DIAGNOSIS — S4292XS Fracture of left shoulder girdle, part unspecified, sequela: Secondary | ICD-10-CM | POA: Diagnosis not present

## 2015-02-10 DIAGNOSIS — R413 Other amnesia: Secondary | ICD-10-CM

## 2015-02-10 DIAGNOSIS — N3281 Overactive bladder: Secondary | ICD-10-CM | POA: Diagnosis not present

## 2015-02-10 DIAGNOSIS — M818 Other osteoporosis without current pathological fracture: Secondary | ICD-10-CM | POA: Diagnosis not present

## 2015-02-10 DIAGNOSIS — J45909 Unspecified asthma, uncomplicated: Secondary | ICD-10-CM | POA: Diagnosis not present

## 2015-02-10 DIAGNOSIS — J4520 Mild intermittent asthma, uncomplicated: Secondary | ICD-10-CM | POA: Diagnosis not present

## 2015-02-10 NOTE — Assessment & Plan Note (Signed)
Stable, continue Omeprazole 20mg daily.  

## 2015-02-10 NOTE — Assessment & Plan Note (Signed)
01/06/15 refused Lexapro 5mg  daily, no suicidal ideation

## 2015-02-10 NOTE — Assessment & Plan Note (Signed)
11/28/14 TSH 0.694,  continue Synthroid 43mcg.

## 2015-02-10 NOTE — Progress Notes (Signed)
Patient ID: Kimberly Greer, female   DOB: 05/08/1921, 82 y.o.   MRN: [id number]  Location:  SNF [facility] Provider:  Marlana Latus NP  Code Status:  DNR Goals of care: Advanced Directive information    Chief Complaint  Patient presents with  . Medical Management of Chronic Issues     HPI: Patient is a 82 y.o. female seen in the SNF at [facility] today for evaluation of depression,  but has no suicidal ideation, her recent left shoulder fx and related ADL dependency, 02/07/15 Ortho may ambulate with walker.  Staff reported swelling legs, weights trended up from # 137, 138, 139, BLE trace edema has no change to me, BNP 22.1 01/05/15, but she admitted she has gained some weight since her right shoulder fx, less active than prior. Denied chest pain, SOB, cough, palpitation, PND,   Hx of hypothyroidism, takes Levothyroxine 81mcg daily, last TSH 0.49 11/28/14, average bathroom trips x2/night while on Myrbetriq 25mg  daily. Left shoulder is less pain, left fingers(left ring finger) are in splint, f/u Ortho, she needs assistance with ADLs due to the fxs of the left shoulder and 4th finger released from contracture.   Review of Systems  Constitutional: Negative for fever, weight loss and malaise/fatigue.  HENT: Positive for hearing loss. Negative for congestion and nosebleeds.   Respiratory: Negative for cough, shortness of breath, wheezing and stridor.   Cardiovascular: Positive for leg swelling. Negative for chest pain and palpitations.       Trace BLE  Genitourinary: Negative for dysuria, frequency and flank pain.  Musculoskeletal: Positive for joint pain. Negative for myalgias.       Scar right palm from release of 4th finger contracture Left shoulder fx 11/06/14, in sling, f/u Ortho, pain with movement  Skin: Negative.  Negative for rash.  Neurological: Negative for dizziness, tremors, seizures, weakness and headaches.  Endo/Heme/Allergies: Negative for polydipsia.  Psychiatric/Behavioral:  Positive for depression.    Past Medical History  Diagnosis Date  . Asthma   . Hypothyroid   . Senile osteoporosis 06/16/2010  . Other abnormal blood chemistry 06/19/2010    hyperglycemia  . Pain in limb 12/05/2009  . Other and unspecified hyperlipidemia 10/24/2009  . Syncope and collapse 05/30/2009  . Carpal tunnel syndrome 03/07/2009  . Sensorineural hearing loss, unspecified 02/21/2009  . Unspecified nasal polyp 02/21/2009  . Disturbance of skin sensation 02/21/2009  . Unspecified urinary incontinence 02/21/2009  . Diverticulosis of colon (without mention of hemorrhage) 02/16/2009  . 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 Other atopic dermatitis and related conditions 02/16/2009  . Osteoarthrosis, unspecified whether generalized or localized, unspecified site 02/16/2009  . Otosclerosis, unspecified 02/22/2003  . Fracture of upper end of left humerus 11/18/14    Patient Active Problem List   Diagnosis Date Noted  . Situational depression 01/06/2015  . Edema 01/03/2015  . Fracture of phalanx of left ring finger 12/06/2014  . GERD (gastroesophageal reflux disease) 11/25/2014  . Osteoporosis 11/25/2014  . Fracture of left shoulder 11/25/2014  . Abnormality of gait 03/08/2014  . Trigger finger, acquired 03/08/2014  . Dyspnea 02/09/2013  . Intrinsic asthma 02/09/2013  . Memory loss 06/23/2012  . Hypothyroid   . Hyperglycemia 06/19/2010  . Hyperlipemia 10/24/2009  . Sensorineural hearing loss, unspecified 02/21/2009  . Urinary incontinence 02/21/2009  . 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 Osteoarthritis 02/16/2009    Allergies  Allergen Reactions  . Avelox [Moxifloxacin Hcl In Nacl] Itching  . Doxycycline   . Hista-Tabs [Triprolidine-Pse] Other (See  Comments)    Passed out  . Septra [Sulfamethoxazole-Trimethoprim]   . Sulfa Antibiotics     Itching/rash (all over)    Medications: Patient's Medications  New Prescriptions   No medications on file  Previous Medications    ACETAMINOPHEN (TYLENOL) 325 MG TABLET    Take 2 tablets (650 mg total) by mouth every 6 (six) hours as needed for mild pain or moderate pain.   CALCIUM-VITAMIN D-VITAMIN K (VIACTIV) [id number] MG-UNT-MCG CHEW    Chew 1 tablet by mouth daily. Take 1 tablet daily for calcium.   FLUTICASONE (FLONASE) 50 MCG/ACT NASAL SPRAY    Shake well before each use and instill one to two sprays in each nostril once daily to control congestion   FLUTICASONE-SALMETEROL (ADVAIR DISKUS) 250-50 MCG/DOSE AEPB    Inhale once daily to help breathing   LEVOTHYROXINE (SYNTHROID, LEVOTHROID) 50 MCG TABLET    Take one tablet by mouth once daily for thyroid   MONTELUKAST (SINGULAIR) 10 MG TABLET    Take one tablet by mouth once daily to help breathing   MULTIPLE VITAMINS-MINERALS (MULTIVITAMIN WITH MINERALS) TABLET    Take 1 tablet by mouth daily. Take 1 tablet as a vitamin supplement.   MYRBETRIQ 25 MG TB24 TABLET    TAKE ONE TABLET BY MOUTH ONCE DAILY   OMEPRAZOLE (PRILOSEC) 20 MG CAPSULE    Take 20 mg by mouth daily. Take 1 tablet daily for heartburn or stomach reflux.   RALOXIFENE (EVISTA) 60 MG TABLET    Take 1 tablet by mouth  daily to treat osteoporosis  Modified Medications   No medications on file  Discontinued Medications   No medications on file    Physical Exam: Filed Vitals:   02/10/15 1622  BP: 105/52  Pulse: 53  Temp: 98.2 F (36.8 C)  TempSrc: Tympanic  Resp: 20   There is no weight on file to calculate BMI.  Physical Exam  Constitutional: She is oriented to person, place, and time. She appears well-developed and well-nourished. No distress.  HENT:  Head: Normocephalic and atraumatic.  Right Ear: External ear normal.  Left Ear: External ear normal.  Nose: Nose normal.  Mouth/Throat: Oropharynx is clear and moist.  Mild hearing deficit  Eyes:  Corrective lenses  Neck: No JVD present. No tracheal deviation present. No thyromegaly present.  Cardiovascular: Normal rate, regular rhythm, normal  heart sounds and intact distal pulses.  Exam reveals no gallop and no friction rub.   No murmur heard. Pulmonary/Chest: No respiratory distress. She has no wheezes. She has rales. She exhibits no tenderness.  Abdominal: She exhibits no distension and no mass. There is no tenderness.  Musculoskeletal: She exhibits edema and tenderness.  Unstable gait. Using cane. Right 4th finger contracture has been released. Left shoulder fx 11/06/14, in sling, f/u Ortho, pain with movement, mild edema in the left forearm and hand. Trace edema seen BLE May ambulate with walker.     Lymphadenopathy:    She has no cervical adenopathy.  Neurological: She is alert and oriented to person, place, and time. No cranial nerve deficit. Coordination normal.  10/27/12 MMSE 28/30. Passed clock drawing.  Skin: No rash noted. No erythema. No pallor.  Psychiatric: She has a normal mood and affect. Her behavior is normal. Judgment and thought content normal.    Labs reviewed: Basic Metabolic Panel:  Recent Labs  02/14/14 0803 11/28/14 01/05/15  NA 142 140 141  K 4.9 4.5 4.3  BUN 20 13 19   CREATININE 0.9 0.8 0.8  Liver Function Tests:  Recent Labs  02/14/14 0803 11/28/14 01/05/15  AST 20 13 16   ALT 17 16 17   ALKPHOS 62 92 63    CBC:  Recent Labs  08/09/14 1111 11/28/14 01/05/15  WBC  --  7.4 6.7  HGB 14.0 11.4* 12.5  HCT  --  35* 38  PLT  --  363 307    Lab Results  Component Value Date   TSH 0.69 11/28/2014   No results found for: HGBA1C Lab Results  Component Value Date   CHOL 211* 02/14/2014   HDL 47 02/14/2014   LDLCALC 133 02/14/2014   TRIG 155 02/14/2014    Significant Diagnostic Results since last visit: none  Patient Care Team: [name], MD as PCP - General (Internal Medicine) Newton Pigg, MD as Consulting Physician (Obstetrics and Gynecology) Monna Fam, MD as Consulting Physician (Ophthalmology) Melida Quitter, MD as Consulting Physician  (Otolaryngology) Myrlene Broker, MD as Consulting Physician (Urology) [facility]  Assessment/Plan Problem List Items Addressed This Visit    Situational depression    01/06/15 refused Lexapro 5mg  daily, no suicidal ideation      Memory loss (Chronic)    Memory lapses.       Intrinsic asthma    Stable, continue Flonase, Singulair 10mg  daily, Advair, Ventolin HFA 2 puffs q6h prn      Hypothyroid (Chronic)    11/28/14 TSH 0.694,  continue Synthroid 32mcg.      Hypertonicity of bladder    Managed with Myrbetriq, average bathroom trips 2x/night.      GERD (gastroesophageal reflux disease) - Primary    Stable, continue Omeprazole 20mg  daily.       Fracture of left shoulder (Chronic)    Healing nicely, ambulates with walker.           Family/ staff Communication: assist the patient with ADLs  Labs/tests ordered: none  [facility] Mast NP Geriatrics [facility] [address], [place] [address] On Call:  [phone] & follow prompts after 5pm & weekends Office Phone:  [phone] Office Fax:  [phone]

## 2015-02-10 NOTE — Assessment & Plan Note (Signed)
Healing nicely, ambulates with walker.

## 2015-02-10 NOTE — Assessment & Plan Note (Signed)
Managed with Myrbetriq, average bathroom trips 2x/night. 

## 2015-02-10 NOTE — Assessment & Plan Note (Signed)
Memory lapses.

## 2015-02-10 NOTE — Assessment & Plan Note (Signed)
Stable, continue Flonase, Singulair 10mg daily, Advair, Ventolin HFA 2 puffs q6h prn 

## 2015-02-13 DIAGNOSIS — M6281 Muscle weakness (generalized): Secondary | ICD-10-CM | POA: Diagnosis not present

## 2015-02-13 DIAGNOSIS — M818 Other osteoporosis without current pathological fracture: Secondary | ICD-10-CM | POA: Diagnosis not present

## 2015-02-13 DIAGNOSIS — R2681 Unsteadiness on feet: Secondary | ICD-10-CM | POA: Diagnosis not present

## 2015-02-13 DIAGNOSIS — S42292A Other displaced fracture of upper end of left humerus, initial encounter for closed fracture: Secondary | ICD-10-CM | POA: Diagnosis not present

## 2015-02-13 DIAGNOSIS — N3281 Overactive bladder: Secondary | ICD-10-CM | POA: Diagnosis not present

## 2015-02-13 DIAGNOSIS — J45909 Unspecified asthma, uncomplicated: Secondary | ICD-10-CM | POA: Diagnosis not present

## 2015-02-15 DIAGNOSIS — N3281 Overactive bladder: Secondary | ICD-10-CM | POA: Diagnosis not present

## 2015-02-15 DIAGNOSIS — M818 Other osteoporosis without current pathological fracture: Secondary | ICD-10-CM | POA: Diagnosis not present

## 2015-02-15 DIAGNOSIS — M6281 Muscle weakness (generalized): Secondary | ICD-10-CM | POA: Diagnosis not present

## 2015-02-15 DIAGNOSIS — S42292A Other displaced fracture of upper end of left humerus, initial encounter for closed fracture: Secondary | ICD-10-CM | POA: Diagnosis not present

## 2015-02-15 DIAGNOSIS — R2681 Unsteadiness on feet: Secondary | ICD-10-CM | POA: Diagnosis not present

## 2015-02-15 DIAGNOSIS — J45909 Unspecified asthma, uncomplicated: Secondary | ICD-10-CM | POA: Diagnosis not present

## 2015-02-16 DIAGNOSIS — R2681 Unsteadiness on feet: Secondary | ICD-10-CM | POA: Diagnosis not present

## 2015-02-16 DIAGNOSIS — M818 Other osteoporosis without current pathological fracture: Secondary | ICD-10-CM | POA: Diagnosis not present

## 2015-02-16 DIAGNOSIS — S42292A Other displaced fracture of upper end of left humerus, initial encounter for closed fracture: Secondary | ICD-10-CM | POA: Diagnosis not present

## 2015-02-16 DIAGNOSIS — J45909 Unspecified asthma, uncomplicated: Secondary | ICD-10-CM | POA: Diagnosis not present

## 2015-02-16 DIAGNOSIS — M6281 Muscle weakness (generalized): Secondary | ICD-10-CM | POA: Diagnosis not present

## 2015-02-16 DIAGNOSIS — N3281 Overactive bladder: Secondary | ICD-10-CM | POA: Diagnosis not present

## 2015-02-20 DIAGNOSIS — J45909 Unspecified asthma, uncomplicated: Secondary | ICD-10-CM | POA: Diagnosis not present

## 2015-02-20 DIAGNOSIS — N3281 Overactive bladder: Secondary | ICD-10-CM | POA: Diagnosis not present

## 2015-02-20 DIAGNOSIS — S42292A Other displaced fracture of upper end of left humerus, initial encounter for closed fracture: Secondary | ICD-10-CM | POA: Diagnosis not present

## 2015-02-20 DIAGNOSIS — M6281 Muscle weakness (generalized): Secondary | ICD-10-CM | POA: Diagnosis not present

## 2015-02-20 DIAGNOSIS — R2681 Unsteadiness on feet: Secondary | ICD-10-CM | POA: Diagnosis not present

## 2015-02-20 DIAGNOSIS — M818 Other osteoporosis without current pathological fracture: Secondary | ICD-10-CM | POA: Diagnosis not present

## 2015-02-21 DIAGNOSIS — M6281 Muscle weakness (generalized): Secondary | ICD-10-CM | POA: Diagnosis not present

## 2015-02-21 DIAGNOSIS — J45909 Unspecified asthma, uncomplicated: Secondary | ICD-10-CM | POA: Diagnosis not present

## 2015-02-21 DIAGNOSIS — R2681 Unsteadiness on feet: Secondary | ICD-10-CM | POA: Diagnosis not present

## 2015-02-21 DIAGNOSIS — N3281 Overactive bladder: Secondary | ICD-10-CM | POA: Diagnosis not present

## 2015-02-21 DIAGNOSIS — S42292A Other displaced fracture of upper end of left humerus, initial encounter for closed fracture: Secondary | ICD-10-CM | POA: Diagnosis not present

## 2015-02-21 DIAGNOSIS — M818 Other osteoporosis without current pathological fracture: Secondary | ICD-10-CM | POA: Diagnosis not present

## 2015-02-23 DIAGNOSIS — J45909 Unspecified asthma, uncomplicated: Secondary | ICD-10-CM | POA: Diagnosis not present

## 2015-02-23 DIAGNOSIS — N3281 Overactive bladder: Secondary | ICD-10-CM | POA: Diagnosis not present

## 2015-02-23 DIAGNOSIS — M818 Other osteoporosis without current pathological fracture: Secondary | ICD-10-CM | POA: Diagnosis not present

## 2015-02-23 DIAGNOSIS — S42292A Other displaced fracture of upper end of left humerus, initial encounter for closed fracture: Secondary | ICD-10-CM | POA: Diagnosis not present

## 2015-02-23 DIAGNOSIS — R2681 Unsteadiness on feet: Secondary | ICD-10-CM | POA: Diagnosis not present

## 2015-02-23 DIAGNOSIS — M6281 Muscle weakness (generalized): Secondary | ICD-10-CM | POA: Diagnosis not present

## 2015-02-24 DIAGNOSIS — N3281 Overactive bladder: Secondary | ICD-10-CM | POA: Diagnosis not present

## 2015-02-24 DIAGNOSIS — R2681 Unsteadiness on feet: Secondary | ICD-10-CM | POA: Diagnosis not present

## 2015-02-24 DIAGNOSIS — J45909 Unspecified asthma, uncomplicated: Secondary | ICD-10-CM | POA: Diagnosis not present

## 2015-02-24 DIAGNOSIS — M6281 Muscle weakness (generalized): Secondary | ICD-10-CM | POA: Diagnosis not present

## 2015-02-24 DIAGNOSIS — M818 Other osteoporosis without current pathological fracture: Secondary | ICD-10-CM | POA: Diagnosis not present

## 2015-02-24 DIAGNOSIS — S42292A Other displaced fracture of upper end of left humerus, initial encounter for closed fracture: Secondary | ICD-10-CM | POA: Diagnosis not present

## 2015-02-27 DIAGNOSIS — J45909 Unspecified asthma, uncomplicated: Secondary | ICD-10-CM | POA: Diagnosis not present

## 2015-02-27 DIAGNOSIS — M6281 Muscle weakness (generalized): Secondary | ICD-10-CM | POA: Diagnosis not present

## 2015-02-27 DIAGNOSIS — N3281 Overactive bladder: Secondary | ICD-10-CM | POA: Diagnosis not present

## 2015-02-27 DIAGNOSIS — S42292A Other displaced fracture of upper end of left humerus, initial encounter for closed fracture: Secondary | ICD-10-CM | POA: Diagnosis not present

## 2015-02-27 DIAGNOSIS — M818 Other osteoporosis without current pathological fracture: Secondary | ICD-10-CM | POA: Diagnosis not present

## 2015-02-27 DIAGNOSIS — R2681 Unsteadiness on feet: Secondary | ICD-10-CM | POA: Diagnosis not present

## 2015-02-28 ENCOUNTER — Encounter: Payer: Self-pay | Admitting: Nurse Practitioner

## 2015-02-28 ENCOUNTER — Non-Acute Institutional Stay (SKILLED_NURSING_FACILITY): Payer: Medicare Other | Admitting: Nurse Practitioner

## 2015-02-28 DIAGNOSIS — R413 Other amnesia: Secondary | ICD-10-CM | POA: Diagnosis not present

## 2015-02-28 DIAGNOSIS — S4292XS Fracture of left shoulder girdle, part unspecified, sequela: Secondary | ICD-10-CM

## 2015-02-28 DIAGNOSIS — F4321 Adjustment disorder with depressed mood: Secondary | ICD-10-CM

## 2015-02-28 DIAGNOSIS — R509 Fever, unspecified: Secondary | ICD-10-CM | POA: Diagnosis not present

## 2015-02-28 DIAGNOSIS — R05 Cough: Secondary | ICD-10-CM | POA: Diagnosis not present

## 2015-02-28 DIAGNOSIS — R609 Edema, unspecified: Secondary | ICD-10-CM

## 2015-02-28 DIAGNOSIS — J4520 Mild intermittent asthma, uncomplicated: Secondary | ICD-10-CM | POA: Diagnosis not present

## 2015-02-28 DIAGNOSIS — K219 Gastro-esophageal reflux disease without esophagitis: Secondary | ICD-10-CM

## 2015-02-28 DIAGNOSIS — N318 Other neuromuscular dysfunction of bladder: Secondary | ICD-10-CM | POA: Diagnosis not present

## 2015-02-28 DIAGNOSIS — E039 Hypothyroidism, unspecified: Secondary | ICD-10-CM | POA: Diagnosis not present

## 2015-02-28 DIAGNOSIS — R110 Nausea: Secondary | ICD-10-CM | POA: Diagnosis not present

## 2015-02-28 LAB — HEPATIC FUNCTION PANEL
ALK PHOS: 56 U/L (ref 25–125)
ALT: 25 U/L (ref 7–35)
AST: 20 U/L (ref 13–35)
Bilirubin, Total: 0.4 mg/dL

## 2015-02-28 LAB — CBC AND DIFFERENTIAL
HCT: 44 % (ref 36–46)
HEMOGLOBIN: 14 g/dL (ref 12.0–16.0)
Platelets: 282 10*3/uL (ref 150–399)
WBC: 18.6 10^3/mL

## 2015-02-28 LAB — TSH: TSH: 0.66 u[IU]/mL (ref 0.41–5.90)

## 2015-02-28 LAB — BASIC METABOLIC PANEL
BUN: 16 mg/dL (ref 4–21)
CREATININE: 0.8 mg/dL (ref 0.5–1.1)
Glucose: 21 mg/dL
POTASSIUM: 4.1 mmol/L (ref 3.4–5.3)
Sodium: 136 mmol/L — AB (ref 137–147)

## 2015-02-28 NOTE — Assessment & Plan Note (Signed)
Healing nicely, ambulates with walker.

## 2015-02-28 NOTE — Assessment & Plan Note (Signed)
Fever, chills, nausea, vomited x 1 day, denied chest pain, SOB, palpitation, abd pain, or dysuria. Will obtain CBC, CMP, TSH, UA C/S, CXR, Phenergan 12.5mg  po q6h prn, Tylenol 650mg  q4h prn x 3 days. Observe the patient.

## 2015-02-28 NOTE — Assessment & Plan Note (Signed)
Trace dependent edema BLE. BNP 22.1 01/05/15

## 2015-02-28 NOTE — Assessment & Plan Note (Signed)
01/06/15 refused Lexapro 5mg  daily, no suicidal ideation 02/28/15 mood is stable.

## 2015-02-28 NOTE — Assessment & Plan Note (Signed)
Stable, continue Flonase, Singulair 10mg daily, Advair, Ventolin HFA 2 puffs q6h prn 

## 2015-02-28 NOTE — Assessment & Plan Note (Signed)
Stable, continue Omeprazole 20mg daily.  

## 2015-02-28 NOTE — Progress Notes (Signed)
Patient ID: Kimberly Greer, female   DOB: 01-Jan-1922, 82 y.o.   MRN: [id number]  Location:  SNF [facility] Provider:  Marlana Latus NP  Code Status:  DNR Goals of care: Advanced Directive information Does patient have an advance directive?: Yes, Type of Advance Directive: Healthcare Power of Attorney  Chief Complaint  Patient presents with  . Acute Visit    Feeling dizzy, nauseated, no vomiting. shaking of arms, very pale     HPI: Patient is a 82 y.o. female seen in the SNF at [facility] today for evaluation of nausea, vomiting, fever, chills, denied chest pain, SOB, abd pain, or dysuria. Healed recent left shoulder fx and related ADL dependency, 02/07/15 Ortho may ambulate with walker.  Staff reported swelling legs, weights trended up from # 137, 138, 139, BLE trace edema has no change to me, BNP 22.1 01/05/15, but she admitted she has gained some weight since her right shoulder fx, less active than prior. Denied chest pain, SOB, cough, palpitation, PND,   Hx of hypothyroidism, takes Levothyroxine 41mcg daily, last TSH 0.49 11/28/14, average bathroom trips x2/night while on Myrbetriq 25mg  daily. Left shoulder is less pain, left fingers(left ring finger) are in splint, f/u Ortho, she needs assistance with ADLs due to the fxs of the left shoulder and 4th finger released from contracture.   Review of Systems  Constitutional: Negative for fever, weight loss and malaise/fatigue.  HENT: Positive for hearing loss. Negative for congestion and nosebleeds.   Respiratory: Negative for cough, shortness of breath, wheezing and stridor.   Cardiovascular: Positive for leg swelling. Negative for chest pain and palpitations.       Trace BLE  Genitourinary: Negative for dysuria, frequency and flank pain.  Musculoskeletal: Positive for joint pain. Negative for myalgias.       Scar right palm from release of 4th finger contracture Left shoulder fx 11/06/14, in sling, f/u Ortho, pain with movement  Skin:  Negative.  Negative for rash.  Neurological: Negative for dizziness, tremors, seizures, weakness and headaches.  Endo/Heme/Allergies: Negative for polydipsia.  Psychiatric/Behavioral: Positive for depression.    Past Medical History  Diagnosis Date  . Asthma   . Hypothyroid   . Senile osteoporosis 06/16/2010  . Other abnormal blood chemistry 06/19/2010    hyperglycemia  . Pain in limb 12/05/2009  . Other and unspecified hyperlipidemia 10/24/2009  . Syncope and collapse 05/30/2009  . Carpal tunnel syndrome 03/07/2009  . Sensorineural hearing loss, unspecified 02/21/2009  . Unspecified nasal polyp 02/21/2009  . Disturbance of skin sensation 02/21/2009  . Unspecified urinary incontinence 02/21/2009  . Diverticulosis of colon (without mention of hemorrhage) 02/16/2009  . 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 Other atopic dermatitis and related conditions 02/16/2009  . Osteoarthrosis, unspecified whether generalized or localized, unspecified site 02/16/2009  . Otosclerosis, unspecified 02/22/2003  . Fracture of upper end of left humerus 11/18/14    Patient Active Problem List   Diagnosis Date Noted  . Fever, unspecified 02/28/2015  . Situational depression 01/06/2015  . Edema 01/03/2015  . Fracture of phalanx of left ring finger 12/06/2014  . GERD (gastroesophageal reflux disease) 11/25/2014  . Osteoporosis 11/25/2014  . Fracture of left shoulder 11/25/2014  . Abnormality of gait 03/08/2014  . Trigger finger, acquired 03/08/2014  . Dyspnea 02/09/2013  . Intrinsic asthma 02/09/2013  . Memory loss 06/23/2012  . Hypothyroid   . Hyperglycemia 06/19/2010  . Hyperlipemia 10/24/2009  . Sensorineural hearing loss, unspecified 02/21/2009  . Urinary incontinence 02/21/2009  .  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 Osteoarthritis 02/16/2009    Allergies  Allergen Reactions  . Avelox [Moxifloxacin Hcl In Nacl] Itching  . Doxycycline   . Hista-Tabs [Triprolidine-Pse] Other (See Comments)     Passed out  . Septra [Sulfamethoxazole-Trimethoprim]   . Sulfa Antibiotics     Itching/rash (all over)    Medications: Patient's Medications  New Prescriptions   No medications on file  Previous Medications   ACETAMINOPHEN (TYLENOL) 325 MG TABLET    Take 2 tablets (650 mg total) by mouth every 6 (six) hours as needed for mild pain or moderate pain.   ALBUTEROL (VENTOLIN HFA) 108 (90 BASE) MCG/ACT INHALER    Inhale 2 puffs by mouth every 6 hours as needed for shortness of breath   CALCIUM-VITAMIN D-VITAMIN K (VIACTIV) 500-500-40 MG-UNT-MCG CHEW    Chew 1 tablet by mouth daily. Take 1 tablet daily for calcium.   FLUTICASONE (FLONASE) 50 MCG/ACT NASAL SPRAY    Shake well before each use and instill one to two sprays in each nostril once daily to control congestion   FLUTICASONE-SALMETEROL (ADVAIR DISKUS) 250-50 MCG/DOSE AEPB    Inhale once daily to help breathing   LEVOTHYROXINE (SYNTHROID, LEVOTHROID) 50 MCG TABLET    Take one tablet by mouth once daily for thyroid   MONTELUKAST (SINGULAIR) 10 MG TABLET    Take one tablet by mouth once daily to help breathing   MULTIPLE VITAMINS-MINERALS (MULTIVITAMIN WITH MINERALS) TABLET    Take 1 tablet by mouth daily. Take 1 tablet as a vitamin supplement.   MYRBETRIQ 25 MG TB24 TABLET    TAKE ONE TABLET BY MOUTH ONCE DAILY   OMEPRAZOLE (PRILOSEC) 20 MG CAPSULE    Take 20 mg by mouth daily. Take 1 tablet daily for heartburn or stomach reflux.   RALOXIFENE (EVISTA) 60 MG TABLET    Take 1 tablet by mouth  daily to treat osteoporosis  Modified Medications   No medications on file  Discontinued Medications   No medications on file    Physical Exam: Filed Vitals:   02/28/15 1427  BP: 140/70  Pulse: 76  Temp: 100 F (37.8 C)  TempSrc: Oral  Resp: 32   There is no weight on file to calculate BMI.  Physical Exam  Constitutional: She is oriented to person, place, and time. She appears well-developed and well-nourished. No distress.  HENT:  Head:  Normocephalic and atraumatic.  Right Ear: External ear normal.  Left Ear: External ear normal.  Nose: Nose normal.  Mouth/Throat: Oropharynx is clear and moist.  Mild hearing deficit  Eyes:  Corrective lenses  Neck: No JVD present. No tracheal deviation present. No thyromegaly present.  Cardiovascular: Normal rate, regular rhythm, normal heart sounds and intact distal pulses.  Exam reveals no gallop and no friction rub.   No murmur heard. Pulmonary/Chest: No respiratory distress. She has no wheezes. She has rales. She exhibits no tenderness.  Abdominal: She exhibits no distension and no mass. There is no tenderness.  Musculoskeletal: She exhibits edema and tenderness.  Unstable gait. Using cane. Right 4th finger contracture has been released. Left shoulder fx 11/06/14, in sling, f/u Ortho, pain with movement, mild edema in the left forearm and hand. Trace edema seen BLE May ambulate with walker.     Lymphadenopathy:    She has no cervical adenopathy.  Neurological: She is alert and oriented to person, place, and time. No cranial nerve deficit. Coordination normal.  10/27/12 MMSE 28/30. Passed clock drawing.  Skin: No  rash noted. No erythema. No pallor.  Psychiatric: She has a normal mood and affect. Her behavior is normal. Judgment and thought content normal.    Labs reviewed: Basic Metabolic Panel:  Recent Labs  11/28/14 01/05/15  NA 140 141  K 4.5 4.3  BUN 13 19  CREATININE 0.8 0.8    Liver Function Tests:  Recent Labs  11/28/14 01/05/15  AST 13 16  ALT 16 17  ALKPHOS 92 63    CBC:  Recent Labs  08/09/14 1111 11/28/14 01/05/15  WBC  --  7.4 6.7  HGB 14.0 11.4* 12.5  HCT  --  35* 38  PLT  --  363 307    Lab Results  Component Value Date   TSH 0.69 11/28/2014   No results found for: HGBA1C Lab Results  Component Value Date   CHOL 211* 02/14/2014   HDL 47 02/14/2014   LDLCALC 133 02/14/2014   TRIG 155 02/14/2014    Significant Diagnostic Results  since last visit: none  Patient Care Team: [name], MD as PCP - General (Internal Medicine) Newton Pigg, MD as Consulting Physician (Obstetrics and Gynecology) Monna Fam, MD as Consulting Physician (Ophthalmology) Melida Quitter, MD as Consulting Physician (Otolaryngology) Myrlene Broker, MD as Consulting Physician (Urology) [facility]  Assessment/Plan Problem List Items Addressed This Visit    Situational depression - Primary    01/06/15 refused Lexapro 5mg  daily, no suicidal ideation 02/28/15 mood is stable.       Memory loss (Chronic)    Memory lapses.       Intrinsic asthma    Stable, continue Flonase, Singulair 10mg  daily, Advair, Ventolin HFA 2 puffs q6h prn      Relevant Medications   albuterol (VENTOLIN HFA) 108 (90 Base) MCG/ACT inhaler   Hypothyroid (Chronic)    11/28/14 TSH 0.694,  continue Synthroid 50mcg, update TSH      Hypertonicity of bladder    Managed with Myrbetriq, average bathroom trips 2x/night.      GERD (gastroesophageal reflux disease)    Stable, continue Omeprazole 20mg  daily.       Fracture of left shoulder (Chronic)    Healing nicely, ambulates with walker.       Fever, unspecified    Fever, chills, nausea, vomited x 1 day, denied chest pain, SOB, palpitation, abd pain, or dysuria. Will obtain CBC, CMP, TSH, UA C/S, CXR, Phenergan 12.5mg  po q6h prn, Tylenol 650mg  q4h prn x 3 days. Observe the patient.       Edema    Trace dependent edema BLE. BNP 22.1 01/05/15          Family/ staff Communication: assist the patient with ADLs  Labs/tests ordered: CBC, CMP, TSH, UA C/S, CXR  [facility] Mast NP Geriatrics [facility] [address], [place] [address] On Call:  [phone] & follow prompts after 5pm & weekends Office Phone:  [phone] Office Fax:  [phone]

## 2015-02-28 NOTE — Assessment & Plan Note (Signed)
Managed with Myrbetriq, average bathroom trips 2x/night. 

## 2015-02-28 NOTE — Assessment & Plan Note (Signed)
Memory lapses.

## 2015-02-28 NOTE — Assessment & Plan Note (Signed)
11/28/14 TSH 0.694,  continue Synthroid 59mcg, update TSH

## 2015-03-02 DIAGNOSIS — R2681 Unsteadiness on feet: Secondary | ICD-10-CM | POA: Diagnosis not present

## 2015-03-02 DIAGNOSIS — N3281 Overactive bladder: Secondary | ICD-10-CM | POA: Diagnosis not present

## 2015-03-02 DIAGNOSIS — J45909 Unspecified asthma, uncomplicated: Secondary | ICD-10-CM | POA: Diagnosis not present

## 2015-03-02 DIAGNOSIS — M6281 Muscle weakness (generalized): Secondary | ICD-10-CM | POA: Diagnosis not present

## 2015-03-02 DIAGNOSIS — S42292A Other displaced fracture of upper end of left humerus, initial encounter for closed fracture: Secondary | ICD-10-CM | POA: Diagnosis not present

## 2015-03-02 DIAGNOSIS — M818 Other osteoporosis without current pathological fracture: Secondary | ICD-10-CM | POA: Diagnosis not present

## 2015-03-03 ENCOUNTER — Encounter: Payer: Self-pay | Admitting: Nurse Practitioner

## 2015-03-03 ENCOUNTER — Non-Acute Institutional Stay (SKILLED_NURSING_FACILITY): Payer: Medicare Other | Admitting: Nurse Practitioner

## 2015-03-03 DIAGNOSIS — N3281 Overactive bladder: Secondary | ICD-10-CM | POA: Diagnosis not present

## 2015-03-03 DIAGNOSIS — R609 Edema, unspecified: Secondary | ICD-10-CM

## 2015-03-03 DIAGNOSIS — R509 Fever, unspecified: Secondary | ICD-10-CM

## 2015-03-03 DIAGNOSIS — E039 Hypothyroidism, unspecified: Secondary | ICD-10-CM | POA: Diagnosis not present

## 2015-03-03 DIAGNOSIS — J4520 Mild intermittent asthma, uncomplicated: Secondary | ICD-10-CM | POA: Diagnosis not present

## 2015-03-03 DIAGNOSIS — N390 Urinary tract infection, site not specified: Secondary | ICD-10-CM

## 2015-03-03 DIAGNOSIS — R413 Other amnesia: Secondary | ICD-10-CM

## 2015-03-03 DIAGNOSIS — K219 Gastro-esophageal reflux disease without esophagitis: Secondary | ICD-10-CM

## 2015-03-03 DIAGNOSIS — M818 Other osteoporosis without current pathological fracture: Secondary | ICD-10-CM | POA: Diagnosis not present

## 2015-03-03 DIAGNOSIS — N318 Other neuromuscular dysfunction of bladder: Secondary | ICD-10-CM | POA: Diagnosis not present

## 2015-03-03 DIAGNOSIS — J45909 Unspecified asthma, uncomplicated: Secondary | ICD-10-CM | POA: Diagnosis not present

## 2015-03-03 DIAGNOSIS — S42292A Other displaced fracture of upper end of left humerus, initial encounter for closed fracture: Secondary | ICD-10-CM | POA: Diagnosis not present

## 2015-03-03 DIAGNOSIS — M6281 Muscle weakness (generalized): Secondary | ICD-10-CM | POA: Diagnosis not present

## 2015-03-03 DIAGNOSIS — R2681 Unsteadiness on feet: Secondary | ICD-10-CM | POA: Diagnosis not present

## 2015-03-03 NOTE — Assessment & Plan Note (Signed)
Related to UTI

## 2015-03-03 NOTE — Assessment & Plan Note (Signed)
Trace dependent edema BLE, mainly in ankles.  BNP 22.1 01/05/15

## 2015-03-03 NOTE — Progress Notes (Signed)
Patient ID: Kimberly Greer, female   DOB: 1921/08/31, 82 y.o.   MRN: [id number]  Location:  [facility] Provider:  Marlana Latus NP  Code Status:  DNR Goals of care: Advanced Directive information Does patient have an advance directive?: Yes, Type of Advance Directive: Healthcare Power of Attorney  Chief Complaint  Patient presents with  . Acute Visit    UTI     HPI: Patient is a 82 y.o. female seen in the SNF at [facility] today for evaluation of feeling fatigue, afebrile, no further nausea or vomiting, denied dysuria or abd/CVA tenderness, urine culture 02/28/15 showed E. Coli >100,000c/ml, wbc 18.6. Healed recent left shoulder fx and related ADL dependency, 02/07/15 Ortho may ambulate with walker.  Staff reported swelling legs, weights trended up from # 137, 138, 139, BLE trace edema has no change to me, BNP 22.1 01/05/15, but she admitted she has gained some weight since her right shoulder fx, less active than prior. Denied chest pain, SOB, cough, palpitation, PND,   Hx of hypothyroidism, takes Levothyroxine 93mcg daily, last TSH 0.49 11/28/14, average bathroom trips x2/night while on Myrbetriq 25mg  daily. Left shoulder is less pain, left fingers(left ring finger) are in splint, f/u Ortho, she needs assistance with ADLs due to the fxs of the left shoulder and 4th finger released from contracture.   Review of Systems  Constitutional: Positive for malaise/fatigue. Negative for fever and weight loss.  HENT: Positive for hearing loss. Negative for congestion and nosebleeds.   Respiratory: Negative for cough, shortness of breath, wheezing and stridor.   Cardiovascular: Positive for leg swelling. Negative for chest pain and palpitations.       Trace BLE  Genitourinary: Negative for dysuria, frequency and flank pain.  Musculoskeletal: Positive for joint pain. Negative for myalgias.       Scar right palm from release of 4th finger contracture Left shoulder fx 11/06/14, in sling, f/u Ortho, pain  with movement  Skin: Negative.  Negative for rash.  Neurological: Negative for dizziness, tremors, seizures, weakness and headaches.  Endo/Heme/Allergies: Negative for polydipsia.  Psychiatric/Behavioral: Positive for depression.    Past Medical History  Diagnosis Date  . Asthma   . Hypothyroid   . Senile osteoporosis 06/16/2010  . Other abnormal blood chemistry 06/19/2010    hyperglycemia  . Pain in limb 12/05/2009  . Other and unspecified hyperlipidemia 10/24/2009  . Syncope and collapse 05/30/2009  . Carpal tunnel syndrome 03/07/2009  . Sensorineural hearing loss, unspecified 02/21/2009  . Unspecified nasal polyp 02/21/2009  . Disturbance of skin sensation 02/21/2009  . Unspecified urinary incontinence 02/21/2009  . Diverticulosis of colon (without mention of hemorrhage) 02/16/2009  . 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 Other atopic dermatitis and related conditions 02/16/2009  . Osteoarthrosis, unspecified whether generalized or localized, unspecified site 02/16/2009  . Otosclerosis, unspecified 02/22/2003  . Fracture of upper end of left humerus 11/18/14    Patient Active Problem List   Diagnosis Date Noted  . UTI (urinary tract infection) 03/03/2015  . Fever, unspecified 02/28/2015  . Situational depression 01/06/2015  . Edema 01/03/2015  . Fracture of phalanx of left ring finger 12/06/2014  . GERD (gastroesophageal reflux disease) 11/25/2014  . Osteoporosis 11/25/2014  . Fracture of left shoulder 11/25/2014  . Abnormality of gait 03/08/2014  . Trigger finger, acquired 03/08/2014  . Dyspnea 02/09/2013  . Intrinsic asthma 02/09/2013  . Memory loss 06/23/2012  . Hypothyroid   . Hyperglycemia 06/19/2010  . Hyperlipemia 10/24/2009  . Sensorineural hearing  loss, unspecified 02/21/2009  . Urinary incontinence 02/21/2009  . 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 Osteoarthritis 02/16/2009    Allergies  Allergen Reactions  . Avelox [Moxifloxacin Hcl In Nacl] Itching  .  Doxycycline   . Hista-Tabs [Triprolidine-Pse] Other (See Comments)    Passed out  . Septra [Sulfamethoxazole-Trimethoprim]   . Sulfa Antibiotics     Itching/rash (all over)    Medications: Patient's Medications  New Prescriptions   No medications on file  Previous Medications   ACETAMINOPHEN (TYLENOL) 325 MG TABLET    Take 2 tablets (650 mg total) by mouth every 6 (six) hours as needed for mild pain or moderate pain.   ALBUTEROL (VENTOLIN HFA) 108 (90 BASE) MCG/ACT INHALER    Inhale 2 puffs by mouth every 6 hours as needed for shortness of breath   CALCIUM-VITAMIN D-VITAMIN K (VIACTIV) 500-500-40 MG-UNT-MCG CHEW    Chew 1 tablet by mouth daily. Take 1 tablet daily for calcium.   CIPROFLOXACIN (CIPRO) 500 MG TABLET    Take 500 mg by mouth 2 (two) times daily. For 7 days   FLUTICASONE (FLONASE) 50 MCG/ACT NASAL SPRAY    Shake well before each use and instill one to two sprays in each nostril once daily to control congestion   FLUTICASONE-SALMETEROL (ADVAIR DISKUS) 250-50 MCG/DOSE AEPB    Inhale once daily to help breathing   LEVOTHYROXINE (SYNTHROID, LEVOTHROID) 50 MCG TABLET    Take one tablet by mouth once daily for thyroid   MONTELUKAST (SINGULAIR) 10 MG TABLET    Take one tablet by mouth once daily to help breathing   MULTIPLE VITAMINS-MINERALS (MULTIVITAMIN WITH MINERALS) TABLET    Take 1 tablet by mouth daily. Take 1 tablet as a vitamin supplement.   MYRBETRIQ 25 MG TB24 TABLET    TAKE ONE TABLET BY MOUTH ONCE DAILY   OMEPRAZOLE (PRILOSEC) 20 MG CAPSULE    Take 20 mg by mouth daily. Take 1 tablet daily for heartburn or stomach reflux.   PROMETHAZINE (PHENERGAN) 12.5 MG TABLET    Take 12.5 mg by mouth every 6 (six) hours as needed for nausea or vomiting.   RALOXIFENE (EVISTA) 60 MG TABLET    Take 1 tablet by mouth  daily to treat osteoporosis   SACCHAROMYCES BOULARDII (FLORASTOR) 250 MG CAPSULE    Take 250 mg by mouth 2 (two) times daily. For 7 days  Modified Medications   No  medications on file  Discontinued Medications   CIPROFLOXACIN (CIPRO) 500 MG TABLET    Take 500 mg by mouth 2 (two) times daily.   SACCHAROMYCES BOULARDII (FLORASTOR) 250 MG CAPSULE    Take 250 mg by mouth 2 (two) times daily. Reported on 03/03/2015    Physical Exam: There were no vitals filed for this visit. There is no weight on file to calculate BMI.  Physical Exam  Constitutional: She is oriented to person, place, and time. She appears well-developed and well-nourished. No distress.  HENT:  Head: Normocephalic and atraumatic.  Right Ear: External ear normal.  Left Ear: External ear normal.  Nose: Nose normal.  Mouth/Throat: Oropharynx is clear and moist.  Mild hearing deficit  Eyes:  Corrective lenses  Neck: No JVD present. No tracheal deviation present. No thyromegaly present.  Cardiovascular: Normal rate, regular rhythm, normal heart sounds and intact distal pulses.  Exam reveals no gallop and no friction rub.   No murmur heard. Pulmonary/Chest: No respiratory distress. She has no wheezes. She has rales. She exhibits no tenderness.  Abdominal:  She exhibits no distension and no mass. There is no tenderness.  Musculoskeletal: She exhibits edema and tenderness.  Unstable gait. Using cane. Right 4th finger contracture has been released. Left shoulder fx 11/06/14, in sling, f/u Ortho, pain with movement, mild edema in the left forearm and hand. Trace edema seen BLE May ambulate with walker.     Lymphadenopathy:    She has no cervical adenopathy.  Neurological: She is alert and oriented to person, place, and time. No cranial nerve deficit. Coordination normal.  10/27/12 MMSE 28/30. Passed clock drawing.  Skin: No rash noted. No erythema. No pallor.  Psychiatric: She has a normal mood and affect. Her behavior is normal. Judgment and thought content normal.    Labs reviewed: Basic Metabolic Panel:  Recent Labs  11/28/14 01/05/15 02/28/15  NA 140 141 136*  K 4.5 4.3 4.1  BUN  13 19 16   CREATININE 0.8 0.8 0.8    Liver Function Tests:  Recent Labs  11/28/14 01/05/15 02/28/15  AST 13 16 20   ALT 16 17 25   ALKPHOS 92 63 56    CBC:  Recent Labs  11/28/14 01/05/15 02/28/15  WBC 7.4 6.7 18.6  HGB 11.4* 12.5 14.0  HCT 35* 38 44  PLT 363 307 282    Lab Results  Component Value Date   TSH 0.66 02/28/2015   No results found for: HGBA1C Lab Results  Component Value Date   CHOL 211* 02/14/2014   HDL 47 02/14/2014   LDLCALC 133 02/14/2014   TRIG 155 02/14/2014    Significant Diagnostic Results since last visit: none  Patient Care Team: [name], MD as PCP - General (Internal Medicine) Newton Pigg, MD as Consulting Physician (Obstetrics and Gynecology) Monna Fam, MD as Consulting Physician (Ophthalmology) Melida Quitter, MD as Consulting Physician (Otolaryngology) Abbie Sons III, MD as Consulting Physician (Urology) [facility]  Assessment/Plan Problem List Items Addressed This Visit    UTI (urinary tract infection) - Primary    feeling fatigue, afebrile, no further nausea or vomiting, denied dysuria or abd/CVA tenderness, urine culture 02/28/15 showed E. Coli >100,000c/ml, wbc 18.6. 03/04/15 Cipro 500mg  po bid x 7 days       Memory loss (Chronic)    Memory lapses.       Intrinsic asthma    Stable      Hypothyroid (Chronic)    11/28/14 TSH 0.694,02/28/15 TSH 0.663,  continue Synthroid 46mcg      Hypertonicity of bladder    Managed with Myrbetriq, average bathroom trips 2x/night.      GERD (gastroesophageal reflux disease)    Stable, continue Omeprazole 20mg  daily.       Relevant Medications   saccharomyces boulardii (FLORASTOR) 250 MG capsule   Fever, unspecified    Related to UTI      Edema    Trace dependent edema BLE, mainly in ankles.  BNP 22.1 01/05/15          Family/ staff Communication: assist the patient with ADLs  Labs/tests ordered: CBC, CMP, TSH, UA C/S, CXR done 02/28/15  [facility] Meril Dray  NP Geriatrics [facility] [address], [place] [address] On Call:  [phone] & follow prompts after 5pm & weekends Office Phone:  [phone] Office Fax:  [phone]

## 2015-03-03 NOTE — Assessment & Plan Note (Signed)
Stable

## 2015-03-03 NOTE — Assessment & Plan Note (Signed)
Memory lapses.

## 2015-03-03 NOTE — Assessment & Plan Note (Signed)
Managed with Myrbetriq, average bathroom trips 2x/night. 

## 2015-03-03 NOTE — Assessment & Plan Note (Signed)
feeling fatigue, afebrile, no further nausea or vomiting, denied dysuria or abd/CVA tenderness, urine culture 02/28/15 showed E. Coli >100,000c/ml, wbc 18.6. 03/04/15 Cipro 500mg  po bid x 7 days

## 2015-03-03 NOTE — Assessment & Plan Note (Signed)
11/28/14 TSH 0.694,02/28/15 TSH 0.663,  continue Synthroid 16mcg

## 2015-03-03 NOTE — Assessment & Plan Note (Signed)
Stable, continue Omeprazole 20mg daily.  

## 2015-03-07 DIAGNOSIS — M6281 Muscle weakness (generalized): Secondary | ICD-10-CM | POA: Diagnosis not present

## 2015-03-07 DIAGNOSIS — J45909 Unspecified asthma, uncomplicated: Secondary | ICD-10-CM | POA: Diagnosis not present

## 2015-03-07 DIAGNOSIS — N3281 Overactive bladder: Secondary | ICD-10-CM | POA: Diagnosis not present

## 2015-03-07 DIAGNOSIS — S42292A Other displaced fracture of upper end of left humerus, initial encounter for closed fracture: Secondary | ICD-10-CM | POA: Diagnosis not present

## 2015-03-07 DIAGNOSIS — M818 Other osteoporosis without current pathological fracture: Secondary | ICD-10-CM | POA: Diagnosis not present

## 2015-03-07 DIAGNOSIS — R2681 Unsteadiness on feet: Secondary | ICD-10-CM | POA: Diagnosis not present

## 2015-03-09 DIAGNOSIS — M818 Other osteoporosis without current pathological fracture: Secondary | ICD-10-CM | POA: Diagnosis not present

## 2015-03-09 DIAGNOSIS — S42292A Other displaced fracture of upper end of left humerus, initial encounter for closed fracture: Secondary | ICD-10-CM | POA: Diagnosis not present

## 2015-03-09 DIAGNOSIS — J45909 Unspecified asthma, uncomplicated: Secondary | ICD-10-CM | POA: Diagnosis not present

## 2015-03-09 DIAGNOSIS — R2681 Unsteadiness on feet: Secondary | ICD-10-CM | POA: Diagnosis not present

## 2015-03-09 DIAGNOSIS — N3281 Overactive bladder: Secondary | ICD-10-CM | POA: Diagnosis not present

## 2015-03-09 DIAGNOSIS — M6281 Muscle weakness (generalized): Secondary | ICD-10-CM | POA: Diagnosis not present

## 2015-03-09 DIAGNOSIS — R55 Syncope and collapse: Secondary | ICD-10-CM | POA: Diagnosis not present

## 2015-03-10 DIAGNOSIS — R2681 Unsteadiness on feet: Secondary | ICD-10-CM | POA: Diagnosis not present

## 2015-03-10 DIAGNOSIS — J45909 Unspecified asthma, uncomplicated: Secondary | ICD-10-CM | POA: Diagnosis not present

## 2015-03-10 DIAGNOSIS — S42292A Other displaced fracture of upper end of left humerus, initial encounter for closed fracture: Secondary | ICD-10-CM | POA: Diagnosis not present

## 2015-03-10 DIAGNOSIS — M818 Other osteoporosis without current pathological fracture: Secondary | ICD-10-CM | POA: Diagnosis not present

## 2015-03-10 DIAGNOSIS — N3281 Overactive bladder: Secondary | ICD-10-CM | POA: Diagnosis not present

## 2015-03-10 DIAGNOSIS — M6281 Muscle weakness (generalized): Secondary | ICD-10-CM | POA: Diagnosis not present

## 2015-03-13 DIAGNOSIS — N3281 Overactive bladder: Secondary | ICD-10-CM | POA: Diagnosis not present

## 2015-03-13 DIAGNOSIS — M6281 Muscle weakness (generalized): Secondary | ICD-10-CM | POA: Diagnosis not present

## 2015-03-13 DIAGNOSIS — R2681 Unsteadiness on feet: Secondary | ICD-10-CM | POA: Diagnosis not present

## 2015-03-13 DIAGNOSIS — J45909 Unspecified asthma, uncomplicated: Secondary | ICD-10-CM | POA: Diagnosis not present

## 2015-03-13 DIAGNOSIS — M818 Other osteoporosis without current pathological fracture: Secondary | ICD-10-CM | POA: Diagnosis not present

## 2015-03-13 DIAGNOSIS — S42292A Other displaced fracture of upper end of left humerus, initial encounter for closed fracture: Secondary | ICD-10-CM | POA: Diagnosis not present

## 2015-03-14 ENCOUNTER — Encounter: Payer: Medicare Other | Admitting: Internal Medicine

## 2015-03-14 DIAGNOSIS — J45909 Unspecified asthma, uncomplicated: Secondary | ICD-10-CM | POA: Diagnosis not present

## 2015-03-14 DIAGNOSIS — M6281 Muscle weakness (generalized): Secondary | ICD-10-CM | POA: Diagnosis not present

## 2015-03-14 DIAGNOSIS — M818 Other osteoporosis without current pathological fracture: Secondary | ICD-10-CM | POA: Diagnosis not present

## 2015-03-14 DIAGNOSIS — N3281 Overactive bladder: Secondary | ICD-10-CM | POA: Diagnosis not present

## 2015-03-14 DIAGNOSIS — R2681 Unsteadiness on feet: Secondary | ICD-10-CM | POA: Diagnosis not present

## 2015-03-14 DIAGNOSIS — S42292A Other displaced fracture of upper end of left humerus, initial encounter for closed fracture: Secondary | ICD-10-CM | POA: Diagnosis not present

## 2015-03-15 DIAGNOSIS — R2681 Unsteadiness on feet: Secondary | ICD-10-CM | POA: Diagnosis not present

## 2015-03-15 DIAGNOSIS — M818 Other osteoporosis without current pathological fracture: Secondary | ICD-10-CM | POA: Diagnosis not present

## 2015-03-15 DIAGNOSIS — M6281 Muscle weakness (generalized): Secondary | ICD-10-CM | POA: Diagnosis not present

## 2015-03-15 DIAGNOSIS — S42292A Other displaced fracture of upper end of left humerus, initial encounter for closed fracture: Secondary | ICD-10-CM | POA: Diagnosis not present

## 2015-03-15 DIAGNOSIS — N3281 Overactive bladder: Secondary | ICD-10-CM | POA: Diagnosis not present

## 2015-03-15 DIAGNOSIS — J45909 Unspecified asthma, uncomplicated: Secondary | ICD-10-CM | POA: Diagnosis not present

## 2015-03-17 DIAGNOSIS — M818 Other osteoporosis without current pathological fracture: Secondary | ICD-10-CM | POA: Diagnosis not present

## 2015-03-17 DIAGNOSIS — M6281 Muscle weakness (generalized): Secondary | ICD-10-CM | POA: Diagnosis not present

## 2015-03-17 DIAGNOSIS — R2681 Unsteadiness on feet: Secondary | ICD-10-CM | POA: Diagnosis not present

## 2015-03-17 DIAGNOSIS — N3281 Overactive bladder: Secondary | ICD-10-CM | POA: Diagnosis not present

## 2015-03-17 DIAGNOSIS — S42292A Other displaced fracture of upper end of left humerus, initial encounter for closed fracture: Secondary | ICD-10-CM | POA: Diagnosis not present

## 2015-03-17 DIAGNOSIS — J45909 Unspecified asthma, uncomplicated: Secondary | ICD-10-CM | POA: Diagnosis not present

## 2015-03-21 DIAGNOSIS — N3281 Overactive bladder: Secondary | ICD-10-CM | POA: Diagnosis not present

## 2015-03-21 DIAGNOSIS — R55 Syncope and collapse: Secondary | ICD-10-CM | POA: Diagnosis not present

## 2015-03-21 DIAGNOSIS — R2681 Unsteadiness on feet: Secondary | ICD-10-CM | POA: Diagnosis not present

## 2015-03-21 DIAGNOSIS — M818 Other osteoporosis without current pathological fracture: Secondary | ICD-10-CM | POA: Diagnosis not present

## 2015-03-21 DIAGNOSIS — R41841 Cognitive communication deficit: Secondary | ICD-10-CM | POA: Diagnosis not present

## 2015-03-21 DIAGNOSIS — R29898 Other symptoms and signs involving the musculoskeletal system: Secondary | ICD-10-CM | POA: Diagnosis not present

## 2015-03-21 DIAGNOSIS — M6281 Muscle weakness (generalized): Secondary | ICD-10-CM | POA: Diagnosis not present

## 2015-03-21 DIAGNOSIS — S42292A Other displaced fracture of upper end of left humerus, initial encounter for closed fracture: Secondary | ICD-10-CM | POA: Diagnosis not present

## 2015-03-22 DIAGNOSIS — M818 Other osteoporosis without current pathological fracture: Secondary | ICD-10-CM | POA: Diagnosis not present

## 2015-03-22 DIAGNOSIS — R29898 Other symptoms and signs involving the musculoskeletal system: Secondary | ICD-10-CM | POA: Diagnosis not present

## 2015-03-22 DIAGNOSIS — R2681 Unsteadiness on feet: Secondary | ICD-10-CM | POA: Diagnosis not present

## 2015-03-22 DIAGNOSIS — M6281 Muscle weakness (generalized): Secondary | ICD-10-CM | POA: Diagnosis not present

## 2015-03-22 DIAGNOSIS — S42292A Other displaced fracture of upper end of left humerus, initial encounter for closed fracture: Secondary | ICD-10-CM | POA: Diagnosis not present

## 2015-03-22 DIAGNOSIS — R41841 Cognitive communication deficit: Secondary | ICD-10-CM | POA: Diagnosis not present

## 2015-03-24 DIAGNOSIS — R29898 Other symptoms and signs involving the musculoskeletal system: Secondary | ICD-10-CM | POA: Diagnosis not present

## 2015-03-24 DIAGNOSIS — S42292A Other displaced fracture of upper end of left humerus, initial encounter for closed fracture: Secondary | ICD-10-CM | POA: Diagnosis not present

## 2015-03-24 DIAGNOSIS — M818 Other osteoporosis without current pathological fracture: Secondary | ICD-10-CM | POA: Diagnosis not present

## 2015-03-24 DIAGNOSIS — M6281 Muscle weakness (generalized): Secondary | ICD-10-CM | POA: Diagnosis not present

## 2015-03-24 DIAGNOSIS — R2681 Unsteadiness on feet: Secondary | ICD-10-CM | POA: Diagnosis not present

## 2015-03-24 DIAGNOSIS — R41841 Cognitive communication deficit: Secondary | ICD-10-CM | POA: Diagnosis not present

## 2015-03-28 DIAGNOSIS — M818 Other osteoporosis without current pathological fracture: Secondary | ICD-10-CM | POA: Diagnosis not present

## 2015-03-28 DIAGNOSIS — M6281 Muscle weakness (generalized): Secondary | ICD-10-CM | POA: Diagnosis not present

## 2015-03-28 DIAGNOSIS — R29898 Other symptoms and signs involving the musculoskeletal system: Secondary | ICD-10-CM | POA: Diagnosis not present

## 2015-03-28 DIAGNOSIS — R41841 Cognitive communication deficit: Secondary | ICD-10-CM | POA: Diagnosis not present

## 2015-03-28 DIAGNOSIS — R2681 Unsteadiness on feet: Secondary | ICD-10-CM | POA: Diagnosis not present

## 2015-03-28 DIAGNOSIS — S42292A Other displaced fracture of upper end of left humerus, initial encounter for closed fracture: Secondary | ICD-10-CM | POA: Diagnosis not present

## 2015-03-30 DIAGNOSIS — R2681 Unsteadiness on feet: Secondary | ICD-10-CM | POA: Diagnosis not present

## 2015-03-30 DIAGNOSIS — M818 Other osteoporosis without current pathological fracture: Secondary | ICD-10-CM | POA: Diagnosis not present

## 2015-03-30 DIAGNOSIS — S42292A Other displaced fracture of upper end of left humerus, initial encounter for closed fracture: Secondary | ICD-10-CM | POA: Diagnosis not present

## 2015-03-30 DIAGNOSIS — M6281 Muscle weakness (generalized): Secondary | ICD-10-CM | POA: Diagnosis not present

## 2015-03-30 DIAGNOSIS — R41841 Cognitive communication deficit: Secondary | ICD-10-CM | POA: Diagnosis not present

## 2015-03-30 DIAGNOSIS — R29898 Other symptoms and signs involving the musculoskeletal system: Secondary | ICD-10-CM | POA: Diagnosis not present

## 2015-04-04 DIAGNOSIS — R2681 Unsteadiness on feet: Secondary | ICD-10-CM | POA: Diagnosis not present

## 2015-04-04 DIAGNOSIS — S42292A Other displaced fracture of upper end of left humerus, initial encounter for closed fracture: Secondary | ICD-10-CM | POA: Diagnosis not present

## 2015-04-04 DIAGNOSIS — M6281 Muscle weakness (generalized): Secondary | ICD-10-CM | POA: Diagnosis not present

## 2015-04-04 DIAGNOSIS — R29898 Other symptoms and signs involving the musculoskeletal system: Secondary | ICD-10-CM | POA: Diagnosis not present

## 2015-04-04 DIAGNOSIS — M818 Other osteoporosis without current pathological fracture: Secondary | ICD-10-CM | POA: Diagnosis not present

## 2015-04-04 DIAGNOSIS — R41841 Cognitive communication deficit: Secondary | ICD-10-CM | POA: Diagnosis not present

## 2015-04-05 DIAGNOSIS — M818 Other osteoporosis without current pathological fracture: Secondary | ICD-10-CM | POA: Diagnosis not present

## 2015-04-05 DIAGNOSIS — M6281 Muscle weakness (generalized): Secondary | ICD-10-CM | POA: Diagnosis not present

## 2015-04-05 DIAGNOSIS — R55 Syncope and collapse: Secondary | ICD-10-CM | POA: Diagnosis not present

## 2015-04-05 DIAGNOSIS — R2681 Unsteadiness on feet: Secondary | ICD-10-CM | POA: Diagnosis not present

## 2015-04-05 DIAGNOSIS — R29898 Other symptoms and signs involving the musculoskeletal system: Secondary | ICD-10-CM | POA: Diagnosis not present

## 2015-04-05 DIAGNOSIS — R41841 Cognitive communication deficit: Secondary | ICD-10-CM | POA: Diagnosis not present

## 2015-04-05 DIAGNOSIS — N3281 Overactive bladder: Secondary | ICD-10-CM | POA: Diagnosis not present

## 2015-04-07 DIAGNOSIS — R29898 Other symptoms and signs involving the musculoskeletal system: Secondary | ICD-10-CM | POA: Diagnosis not present

## 2015-04-07 DIAGNOSIS — R55 Syncope and collapse: Secondary | ICD-10-CM | POA: Diagnosis not present

## 2015-04-07 DIAGNOSIS — M6281 Muscle weakness (generalized): Secondary | ICD-10-CM | POA: Diagnosis not present

## 2015-04-07 DIAGNOSIS — R2681 Unsteadiness on feet: Secondary | ICD-10-CM | POA: Diagnosis not present

## 2015-04-07 DIAGNOSIS — M818 Other osteoporosis without current pathological fracture: Secondary | ICD-10-CM | POA: Diagnosis not present

## 2015-04-07 DIAGNOSIS — R41841 Cognitive communication deficit: Secondary | ICD-10-CM | POA: Diagnosis not present

## 2015-04-11 DIAGNOSIS — R41841 Cognitive communication deficit: Secondary | ICD-10-CM | POA: Diagnosis not present

## 2015-04-11 DIAGNOSIS — R2681 Unsteadiness on feet: Secondary | ICD-10-CM | POA: Diagnosis not present

## 2015-04-11 DIAGNOSIS — R29898 Other symptoms and signs involving the musculoskeletal system: Secondary | ICD-10-CM | POA: Diagnosis not present

## 2015-04-11 DIAGNOSIS — R55 Syncope and collapse: Secondary | ICD-10-CM | POA: Diagnosis not present

## 2015-04-11 DIAGNOSIS — M6281 Muscle weakness (generalized): Secondary | ICD-10-CM | POA: Diagnosis not present

## 2015-04-11 DIAGNOSIS — M818 Other osteoporosis without current pathological fracture: Secondary | ICD-10-CM | POA: Diagnosis not present

## 2015-04-13 DIAGNOSIS — R29898 Other symptoms and signs involving the musculoskeletal system: Secondary | ICD-10-CM | POA: Diagnosis not present

## 2015-04-13 DIAGNOSIS — M6281 Muscle weakness (generalized): Secondary | ICD-10-CM | POA: Diagnosis not present

## 2015-04-13 DIAGNOSIS — M818 Other osteoporosis without current pathological fracture: Secondary | ICD-10-CM | POA: Diagnosis not present

## 2015-04-13 DIAGNOSIS — R55 Syncope and collapse: Secondary | ICD-10-CM | POA: Diagnosis not present

## 2015-04-13 DIAGNOSIS — R41841 Cognitive communication deficit: Secondary | ICD-10-CM | POA: Diagnosis not present

## 2015-04-13 DIAGNOSIS — R2681 Unsteadiness on feet: Secondary | ICD-10-CM | POA: Diagnosis not present

## 2015-04-14 ENCOUNTER — Non-Acute Institutional Stay: Payer: Medicare Other | Admitting: Nurse Practitioner

## 2015-04-14 ENCOUNTER — Encounter: Payer: Self-pay | Admitting: Nurse Practitioner

## 2015-04-14 DIAGNOSIS — F4321 Adjustment disorder with depressed mood: Secondary | ICD-10-CM | POA: Diagnosis not present

## 2015-04-14 DIAGNOSIS — E039 Hypothyroidism, unspecified: Secondary | ICD-10-CM | POA: Diagnosis not present

## 2015-04-14 DIAGNOSIS — K219 Gastro-esophageal reflux disease without esophagitis: Secondary | ICD-10-CM | POA: Diagnosis not present

## 2015-04-14 DIAGNOSIS — R609 Edema, unspecified: Secondary | ICD-10-CM

## 2015-04-14 DIAGNOSIS — S4292XS Fracture of left shoulder girdle, part unspecified, sequela: Secondary | ICD-10-CM | POA: Diagnosis not present

## 2015-04-14 DIAGNOSIS — R2681 Unsteadiness on feet: Secondary | ICD-10-CM | POA: Diagnosis not present

## 2015-04-14 DIAGNOSIS — M818 Other osteoporosis without current pathological fracture: Secondary | ICD-10-CM | POA: Diagnosis not present

## 2015-04-14 DIAGNOSIS — J4520 Mild intermittent asthma, uncomplicated: Secondary | ICD-10-CM

## 2015-04-14 DIAGNOSIS — R413 Other amnesia: Secondary | ICD-10-CM | POA: Diagnosis not present

## 2015-04-14 DIAGNOSIS — N318 Other neuromuscular dysfunction of bladder: Secondary | ICD-10-CM | POA: Diagnosis not present

## 2015-04-14 DIAGNOSIS — R29898 Other symptoms and signs involving the musculoskeletal system: Secondary | ICD-10-CM | POA: Diagnosis not present

## 2015-04-14 DIAGNOSIS — R41841 Cognitive communication deficit: Secondary | ICD-10-CM | POA: Diagnosis not present

## 2015-04-14 DIAGNOSIS — M6281 Muscle weakness (generalized): Secondary | ICD-10-CM | POA: Diagnosis not present

## 2015-04-14 DIAGNOSIS — R55 Syncope and collapse: Secondary | ICD-10-CM | POA: Diagnosis not present

## 2015-04-14 NOTE — Assessment & Plan Note (Signed)
Stable, continue Omeprazole 20mg daily.  

## 2015-04-14 NOTE — Assessment & Plan Note (Signed)
Stable

## 2015-04-14 NOTE — Assessment & Plan Note (Signed)
01/06/15 refused Lexapro 5mg  daily, no suicidal ideation 02/28/15 mood is stable.  04/14/15 trial of Mirtazapine 7.5mg , observe the patient.

## 2015-04-14 NOTE — Assessment & Plan Note (Signed)
Memory lapses. Continue AL for care needs 

## 2015-04-14 NOTE — Assessment & Plan Note (Signed)
Managed with Myrbetriq, average bathroom trips 2x/night. 

## 2015-04-14 NOTE — Progress Notes (Signed)
Patient ID: Kimberly Greer, female   DOB: 1921-12-05, 82 y.o.   MRN: [id number]  Location:  [place] Room Number: AL 11 Place of Service: AL [facility] Provider:  Lennie Odor Iva Posten NP  [name], MD  Patient Care Team: [name], MD as PCP - General (Internal Medicine) Newton Pigg, MD as Consulting Physician (Obstetrics and Gynecology) Monna Fam, MD as Consulting Physician (Ophthalmology) Melida Quitter, MD as Consulting Physician (Otolaryngology) Myrlene Broker, MD as Consulting Physician (Urology) [facility]  Extended Emergency Contact Information Primary Emergency Contact: Market,[name] Address: [place], [place] Phone: [phone] Work Phone: [phone] Mobile Phone: [phone] Relation: Son Secondary Emergency Contact: Teofilo [place] Phone: [phone] Work Phone: [phone] Relation: Friend  Code Status:  DNR Goals of care: Advanced Directive information Advanced Directives 04/14/2015  Does patient have an advance directive? Yes  Type of Advance Directive Healthcare Power of Attorney  Copy of advanced directive(s) in chart? Yes     Chief Complaint  Patient presents with  . Depression    pt is very depressed feels like doing nothing  . Weight Loss    HPI:  Pt is a 82 y.o. female seen today for medical management of chronic diseases.  Hx of hypothyroidism, takes Levothyroxine 56mcg daily, last TSH 0.66 02/28/15, average bathroom trips x2/night while on Myrbetriq 25mg  daily. GERD is asymptomatic while on Omeprazole.  Left shoulder is less pain, left fingers(left ring finger) are in splint, f/u Ortho, she needs assistance with ADLs due to the fxs of the left shoulder and 4th finger released from contracture.   C/o "blah" mood, no motivation, poor appetite, tolerated boost, but losing weight. Denied pain, nausea, vomiting, constipation, or  change of sleep pattern.   Past Medical History  Diagnosis Date  . Asthma   . Hypothyroid   . Senile osteoporosis 06/16/2010  . Other abnormal blood chemistry 06/19/2010    hyperglycemia  . Pain in limb 12/05/2009  . Other and unspecified hyperlipidemia 10/24/2009  . Syncope and collapse 05/30/2009  . Carpal tunnel syndrome 03/07/2009  . Sensorineural hearing loss, unspecified 02/21/2009  . Unspecified nasal polyp 02/21/2009  . Disturbance of skin sensation 02/21/2009  . Unspecified urinary incontinence 02/21/2009  . Diverticulosis of colon (without mention of hemorrhage) 02/16/2009  . 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 Other atopic dermatitis and related conditions 02/16/2009  . Osteoarthrosis, unspecified whether generalized or localized, unspecified site 02/16/2009  . Otosclerosis, unspecified 02/22/2003  . Fracture of upper end of left humerus 11/18/14   Past Surgical History  Procedure Laterality Date  . Breast biopsy  07/16/1990    benign left breast needle biopsy  Dr. Margot Chimes  . Abdominal hysterectomy  1999    Dr. Collier Bullock  . Cataract extraction w/ intraocular lens  implant, bilateral Bilateral   . Carpal tunnel release  04/07/2009    right Dr. Daylene Katayama  . Trigger finger release Left 08/09/2014    Procedure: RELEASE TRIGGER FINGER/A-1 PULLEY LEFT MIDDLE FINGER;  Surgeon: Daryll Brod, MD;  Location: [place];  Service: Orthopedics;  Laterality: Left;  REGIONAL/FAB    Allergies  Allergen Reactions  . Avelox [Moxifloxacin Hcl In Nacl] Itching  . Doxycycline   . Hista-Tabs [Triprolidine-Pse] Other (See Comments)    Passed out  . Septra [Sulfamethoxazole-Trimethoprim]   . Sulfa Antibiotics  Itching/rash (all over)      Medication List       This list is accurate as of: 04/14/15  5:06 PM.  Always use your most recent med list.               acetaminophen 325 MG tablet  Commonly known as:  TYLENOL  Take 2 tablets (650 mg total) by mouth every 6 (six) hours as  needed for mild pain or moderate pain.     feeding supplement Liqd  Take 1 Container by mouth 3 (three) times daily between meals.     fluticasone 50 MCG/ACT nasal spray  Commonly known as:  FLONASE  Shake well before each use and instill one to two sprays in each nostril once daily to control congestion     Fluticasone-Salmeterol 250-50 MCG/DOSE Aepb  Commonly known as:  ADVAIR DISKUS  Inhale once daily to help breathing     levothyroxine 50 MCG tablet  Commonly known as:  SYNTHROID, LEVOTHROID  Take one tablet by mouth once daily for thyroid     montelukast 10 MG tablet  Commonly known as:  SINGULAIR  Take one tablet by mouth once daily to help breathing     multivitamin with minerals tablet  Take 1 tablet by mouth daily. Take 1 tablet as a vitamin supplement.     MYRBETRIQ 25 MG Tb24 tablet  Generic drug:  mirabegron ER  TAKE ONE TABLET BY MOUTH ONCE DAILY     omeprazole 20 MG capsule  Commonly known as:  PRILOSEC  Take 20 mg by mouth daily. Take 1 tablet daily for heartburn or stomach reflux.     raloxifene 60 MG tablet  Commonly known as:  EVISTA  Take 1 tablet by mouth  daily to treat osteoporosis     VENTOLIN HFA 108 (90 Base) MCG/ACT inhaler  Generic drug:  albuterol  Inhale 2 puffs by mouth every 6 hours as needed for shortness of breath     VIACTIV 500-500-40 MG-UNT-MCG Chew  Generic drug:  Calcium-Vitamin D-Vitamin K  Chew 1 tablet by mouth daily. Take 1 tablet daily for calcium.        Review of Systems  Constitutional: Positive for activity change, appetite change, fatigue and unexpected weight change. Negative for fever.  HENT: Positive for hearing loss. Negative for congestion and nosebleeds.   Respiratory: Negative for cough, shortness of breath, wheezing and stridor.   Cardiovascular: Positive for leg swelling. Negative for chest pain and palpitations.       Trace BLE  Endocrine: Negative for polydipsia.  Genitourinary: Negative for dysuria,  frequency and flank pain.  Musculoskeletal: Negative for myalgias.       Scar right palm from release of 4th finger contracture Left shoulder fx 11/06/14, in sling, f/u Ortho, pain with movement  Skin: Negative.  Negative for rash.  Neurological: Negative for dizziness, tremors, seizures, weakness and headaches.  Psychiatric/Behavioral: Positive for dysphoric mood.    Immunization History  Administered Date(s) Administered  . Influenza Whole 12/27/2011, 11/05/2012  . Influenza-Unspecified 11/18/2013, 11/03/2014  . PPD Test 04/04/2008, 11/23/2014  . Pneumococcal Polysaccharide-23 02/05/2008, 06/03/2008  . Td 02/05/2008, 06/03/2008   Pertinent  Health Maintenance Due  Topic Date Due  . DEXA SCAN  04/20/1988  . PNA vac Low Risk Adult (2 of 2 - PCV13) 06/03/2009  . INFLUENZA VACCINE  09/05/2015   Fall Risk  01/02/2015 09/06/2014 03/08/2014  Falls in the past year? Yes Yes No  Number falls in past yr:  1 1 -  Injury with Fall? Yes Yes -  Risk Factor Category  High Fall Risk - -  Risk for fall due to : History of fall(s) - -  Follow up Falls evaluation completed - -   Functional Status Survey:    Filed Vitals:   04/14/15 1038  BP: 104/63  Pulse: 58  Temp: 98 F (36.7 C)  TempSrc: Oral  Resp: 16  Height: 4\' 11"  (1.499 m)  Weight: 133 lb (60.328 kg)   Body mass index is 26.85 kg/(m^2). Physical Exam  Constitutional: She is oriented to person, place, and time. She appears well-developed and well-nourished. No distress.  HENT:  Head: Normocephalic and atraumatic.  Right Ear: External ear normal.  Left Ear: External ear normal.  Nose: Nose normal.  Mouth/Throat: Oropharynx is clear and moist.  Mild hearing deficit  Eyes:  Corrective lenses  Neck: No JVD present. No tracheal deviation present. No thyromegaly present.  Cardiovascular: Normal rate, regular rhythm, normal heart sounds and intact distal pulses.  Exam reveals no gallop and no friction rub.   No murmur  heard. Pulmonary/Chest: No respiratory distress. She has no wheezes. She has rales. She exhibits no tenderness.  Abdominal: She exhibits no distension and no mass. There is no tenderness.  Musculoskeletal: She exhibits edema and tenderness.  Unstable gait. Using cane. Right 4th finger contracture has been released. Left shoulder fx 11/06/14, decreased ROM and pain with movement. 1+ edema seen BLE May ambulate with walker.     Lymphadenopathy:    She has no cervical adenopathy.  Neurological: She is alert and oriented to person, place, and time. No cranial nerve deficit. Coordination normal.  10/27/12 MMSE 28/30. Passed clock drawing.  Skin: No rash noted. No erythema. No pallor.  Psychiatric: She has a normal mood and affect. Her behavior is normal. Judgment and thought content normal.    Labs reviewed:  Recent Labs  11/28/14 01/05/15 02/28/15  NA 140 141 136*  K 4.5 4.3 4.1  BUN 13 19 16   CREATININE 0.8 0.8 0.8    Recent Labs  11/28/14 01/05/15 02/28/15  AST 13 16 20   ALT 16 17 25   ALKPHOS 92 63 56    Recent Labs  11/28/14 01/05/15 02/28/15  WBC 7.4 6.7 18.6  HGB 11.4* 12.5 14.0  HCT 35* 38 44  PLT 363 307 282   Lab Results  Component Value Date   TSH 0.66 02/28/2015   No results found for: HGBA1C Lab Results  Component Value Date   CHOL 211* 02/14/2014   HDL 47 02/14/2014   LDLCALC 133 02/14/2014   TRIG 155 02/14/2014    Significant Diagnostic Results in last 30 days:  No results found.  Assessment/Plan  Situational depression 01/06/15 refused Lexapro 5mg  daily, no suicidal ideation 02/28/15 mood is stable.  04/14/15 trial of Mirtazapine 7.5mg , observe the patient.   GERD (gastroesophageal reflux disease) Stable, continue Omeprazole 20mg  daily.   Memory loss Memory lapses. Continue AL for care needs  Edema 1+ dependent edema BLE, mainly in ankles.  BNP 22.1 01/05/15  Fracture of left shoulder healed  Hypertonicity of bladder Managed with  Myrbetriq, average bathroom trips 2x/night.  Intrinsic asthma Stable  Hypothyroid 11/28/14 TSH 0.694,  02/28/15 TSH 0.663, continue Synthroid 42mcg    Family/ staff Communication: continue AL for care needs.   Labs/tests ordered:  none

## 2015-04-14 NOTE — Assessment & Plan Note (Addendum)
1+ dependent edema BLE, mainly in ankles.  BNP 22.1 01/05/15

## 2015-04-14 NOTE — Assessment & Plan Note (Signed)
11/28/14 TSH 0.694,  02/28/15 TSH 0.663, continue Synthroid 108mcg

## 2015-04-14 NOTE — Assessment & Plan Note (Signed)
healed 

## 2015-04-17 DIAGNOSIS — R2681 Unsteadiness on feet: Secondary | ICD-10-CM | POA: Diagnosis not present

## 2015-04-17 DIAGNOSIS — R29898 Other symptoms and signs involving the musculoskeletal system: Secondary | ICD-10-CM | POA: Diagnosis not present

## 2015-04-17 DIAGNOSIS — R41841 Cognitive communication deficit: Secondary | ICD-10-CM | POA: Diagnosis not present

## 2015-04-17 DIAGNOSIS — R0602 Shortness of breath: Secondary | ICD-10-CM | POA: Diagnosis not present

## 2015-04-17 DIAGNOSIS — R55 Syncope and collapse: Secondary | ICD-10-CM | POA: Diagnosis not present

## 2015-04-17 DIAGNOSIS — M6281 Muscle weakness (generalized): Secondary | ICD-10-CM | POA: Diagnosis not present

## 2015-04-17 DIAGNOSIS — M818 Other osteoporosis without current pathological fracture: Secondary | ICD-10-CM | POA: Diagnosis not present

## 2015-04-18 ENCOUNTER — Encounter: Payer: Self-pay | Admitting: Nurse Practitioner

## 2015-04-18 ENCOUNTER — Non-Acute Institutional Stay: Payer: Medicare Other | Admitting: Nurse Practitioner

## 2015-04-18 DIAGNOSIS — R413 Other amnesia: Secondary | ICD-10-CM | POA: Diagnosis not present

## 2015-04-18 DIAGNOSIS — J4520 Mild intermittent asthma, uncomplicated: Secondary | ICD-10-CM | POA: Diagnosis not present

## 2015-04-18 DIAGNOSIS — N318 Other neuromuscular dysfunction of bladder: Secondary | ICD-10-CM | POA: Diagnosis not present

## 2015-04-18 DIAGNOSIS — F4321 Adjustment disorder with depressed mood: Secondary | ICD-10-CM

## 2015-04-18 DIAGNOSIS — E039 Hypothyroidism, unspecified: Secondary | ICD-10-CM

## 2015-04-18 DIAGNOSIS — K219 Gastro-esophageal reflux disease without esophagitis: Secondary | ICD-10-CM

## 2015-04-18 DIAGNOSIS — R609 Edema, unspecified: Secondary | ICD-10-CM | POA: Diagnosis not present

## 2015-04-18 NOTE — Assessment & Plan Note (Signed)
1+ dependent edema BLE, R>L, seems worse,  BNP 22.1 01/05/15. Will add Furosemide 10mg  po daily in setting of increased SOB, update CBC, CMP, BNP

## 2015-04-18 NOTE — Progress Notes (Signed)
Patient ID: Kimberly Greer, female   DOB: 1921-08-29, 82 y.o.   MRN: [id number]  Location:  [facility] Room Number: AL 11 Place of Service: AL [facility] Provider:  [name] NP  [name], MD  Patient Care Team: [name], MD as PCP - General (Internal Medicine) Newton Pigg, MD as Consulting Physician (Obstetrics and Gynecology) Monna Fam, MD as Consulting Physician (Ophthalmology) Melida Quitter, MD as Consulting Physician (Otolaryngology) Myrlene Broker, MD as Consulting Physician (Urology) [facility]  Extended Emergency Contact Information Primary Emergency Contact: Shadowens,[name] Address: [place], [place] Phone: [phone] Work Phone: [phone] Mobile Phone: [phone] Relation: Son Secondary Emergency Contact: Teofilo [place] Phone: [phone] Work Phone: [phone] Relation: Friend  Code Status:  DNR Goals of care: Advanced Directive information Advanced Directives 04/18/2015  Does patient have an advance directive? Yes  Type of Advance Directive [place]  Does patient want to make changes to advanced directive? No - Patient declined  Copy of advanced directive(s) in chart? Yes     Chief Complaint  Patient presents with  . Shortness of Breath    HPI:  Pt is a 82 y.o. female seen today for medical management of chronic diseases.  SOB, no productive cough, not new, hx of asthma, but seems worse, also she is noted to have increased BLE edema R>L, no O2 desaturation or chest pain or audible wheezes   Hx of depression, Mirtazapine 7.5mg  started about 4 days ago, started she sleeps well, no significant improvement in her appetite, but she seems brighter today, hypothyroidism, takes Levothyroxine 46mcg daily, last TSH 0.66 02/28/15, average bathroom trips x2/night while on Myrbetriq 25mg  daily. GERD is  asymptomatic while on Omeprazole.  Left shoulder is less pain, left fingers(left ring finger) are in splint, f/u Ortho, she needs assistance with ADLs due to the fxs of the left shoulder and 4th finger released from contracture.    Past Medical History  Diagnosis Date  . Asthma   . Hypothyroid   . Senile osteoporosis 06/16/2010  . Other abnormal blood chemistry 06/19/2010    hyperglycemia  . Pain in limb 12/05/2009  . Other and unspecified hyperlipidemia 10/24/2009  . Syncope and collapse 05/30/2009  . Carpal tunnel syndrome 03/07/2009  . Sensorineural hearing loss, unspecified 02/21/2009  . Unspecified nasal polyp 02/21/2009  . Disturbance of skin sensation 02/21/2009  . Unspecified urinary incontinence 02/21/2009  . Diverticulosis of colon (without mention of hemorrhage) 02/16/2009  . 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 Other atopic dermatitis and related conditions 02/16/2009  . Osteoarthrosis, unspecified whether generalized or localized, unspecified site 02/16/2009  . Otosclerosis, unspecified 02/22/2003  . Fracture of upper end of left humerus 11/18/14   Past Surgical History  Procedure Laterality Date  . Breast biopsy  07/16/1990    benign left breast needle biopsy  Dr. Margot Chimes  . Abdominal hysterectomy  1999    Dr. Collier Bullock  . Cataract extraction w/ intraocular lens  implant, bilateral Bilateral   . Carpal tunnel release  04/07/2009    right Dr. Daylene Katayama  . Trigger finger release Left 08/09/2014    Procedure: RELEASE TRIGGER FINGER/A-1 PULLEY LEFT MIDDLE FINGER;  Surgeon: Daryll Brod, MD;  Location: [facility];  Service: Orthopedics;  Laterality: Left;  REGIONAL/FAB    Allergies  Allergen Reactions  . Avelox [  Moxifloxacin Hcl In Nacl] Itching  . Doxycycline   . Hista-Tabs [Triprolidine-Pse] Other (See Comments)    Passed out  . Septra [Sulfamethoxazole-Trimethoprim]   . Sulfa Antibiotics     Itching/rash (all over)      Medication List       This list is accurate  as of: 04/18/15  3:22 PM.  Always use your most recent med list.               acetaminophen 325 MG tablet  Commonly known as:  TYLENOL  Take 2 tablets (650 mg total) by mouth every 6 (six) hours as needed for mild pain or moderate pain.     feeding supplement Liqd  Take 1 Container by mouth 3 (three) times daily between meals.     fluticasone 50 MCG/ACT nasal spray  Commonly known as:  FLONASE  Shake well before each use and instill one to two sprays in each nostril once daily to control congestion     Fluticasone-Salmeterol 250-50 MCG/DOSE Aepb  Commonly known as:  ADVAIR DISKUS  Inhale once daily to help breathing     furosemide 20 MG tablet  Commonly known as:  LASIX  Take 10 mg by mouth daily.     levothyroxine 50 MCG tablet  Commonly known as:  SYNTHROID, LEVOTHROID  Take one tablet by mouth once daily for thyroid     montelukast 10 MG tablet  Commonly known as:  SINGULAIR  Take one tablet by mouth once daily to help breathing     multivitamin with minerals tablet  Take 1 tablet by mouth daily. Take 1 tablet as a vitamin supplement.     MYRBETRIQ 25 MG Tb24 tablet  Generic drug:  mirabegron ER  TAKE ONE TABLET BY MOUTH ONCE DAILY     omeprazole 20 MG capsule  Commonly known as:  PRILOSEC  Take 20 mg by mouth daily. Take 1 tablet daily for heartburn or stomach reflux.     raloxifene 60 MG tablet  Commonly known as:  EVISTA  Take 1 tablet by mouth  daily to treat osteoporosis     VENTOLIN HFA 108 (90 Base) MCG/ACT inhaler  Generic drug:  albuterol  Inhale 2 puffs by mouth every 6 hours as needed for shortness of breath     VIACTIV 500-500-40 MG-UNT-MCG Chew  Generic drug:  Calcium-Vitamin D-Vitamin K  Chew 1 tablet by mouth daily. Take 1 tablet daily for calcium.        Review of Systems  Constitutional: Positive for activity change, appetite change, fatigue and unexpected weight change. Negative for fever.  HENT: Positive for hearing loss. Negative for  congestion and nosebleeds.   Respiratory: Positive for cough and shortness of breath. Negative for wheezing and stridor.   Cardiovascular: Positive for leg swelling. Negative for chest pain and palpitations.       Trace BLE  Endocrine: Negative for polydipsia.  Genitourinary: Negative for dysuria, frequency and flank pain.  Musculoskeletal: Negative for myalgias.       Scar right palm from release of 4th finger contracture Left shoulder fx 11/06/14, in sling, f/u Ortho, pain with movement  Skin: Negative.  Negative for rash.  Neurological: Negative for dizziness, tremors, seizures, weakness and headaches.  Psychiatric/Behavioral: Positive for dysphoric mood.    Immunization History  Administered Date(s) Administered  . Influenza Whole 12/27/2011, 11/05/2012  . Influenza-Unspecified 11/18/2013, 11/03/2014  . PPD Test 04/04/2008, 11/23/2014  . Pneumococcal Polysaccharide-23 02/05/2008, 06/03/2008  . Td 02/05/2008, 06/03/2008  Pertinent  Health Maintenance Due  Topic Date Due  . DEXA SCAN  04/20/1988  . PNA vac Low Risk Adult (2 of 2 - PCV13) 06/03/2009  . INFLUENZA VACCINE  09/05/2015   Fall Risk  01/02/2015 09/06/2014 03/08/2014  Falls in the past year? Yes Yes No  Number falls in past yr: 1 1 -  Injury with Fall? Yes Yes -  Risk Factor Category  High Fall Risk - -  Risk for fall due to : History of fall(s) - -  Follow up Falls evaluation completed - -   Functional Status Survey:    Filed Vitals:   04/18/15 1128  BP: 104/63  Pulse: 58  Temp: 98 F (36.7 C)  TempSrc: Oral  Resp: 16  Height: 4\' 11"  (1.499 m)  Weight: 133 lb (60.328 kg)   Body mass index is 26.85 kg/(m^2). Physical Exam  Constitutional: She is oriented to person, place, and time. She appears well-developed and well-nourished. No distress.  HENT:  Head: Normocephalic and atraumatic.  Right Ear: External ear normal.  Left Ear: External ear normal.  Nose: Nose normal.  Mouth/Throat: Oropharynx is clear  and moist.  Mild hearing deficit  Eyes:  Corrective lenses  Neck: No JVD present. No tracheal deviation present. No thyromegaly present.  Cardiovascular: Normal rate, regular rhythm, normal heart sounds and intact distal pulses.  Exam reveals no gallop and no friction rub.   No murmur heard. Pulmonary/Chest: No respiratory distress. She has no wheezes. She has rales. She exhibits no tenderness.  Abdominal: She exhibits no distension and no mass. There is no tenderness.  Musculoskeletal: She exhibits edema and tenderness.  Unstable gait. Using cane. Right 4th finger contracture has been released. Left shoulder fx 11/06/14, decreased ROM and pain with movement. 1+ edema seen BLE, R>L May ambulate with walker.     Lymphadenopathy:    She has no cervical adenopathy.  Neurological: She is alert and oriented to person, place, and time. No cranial nerve deficit. Coordination normal.  10/27/12 MMSE 28/30. Passed clock drawing.  Skin: No rash noted. No erythema. No pallor.  Psychiatric: She has a normal mood and affect. Her behavior is normal. Judgment and thought content normal.    Labs reviewed:  Recent Labs  11/28/14 01/05/15 02/28/15  NA 140 141 136*  K 4.5 4.3 4.1  BUN 13 19 16   CREATININE 0.8 0.8 0.8    Recent Labs  11/28/14 01/05/15 02/28/15  AST 13 16 20   ALT 16 17 25   ALKPHOS 92 63 56    Recent Labs  11/28/14 01/05/15 02/28/15  WBC 7.4 6.7 18.6  HGB 11.4* 12.5 14.0  HCT 35* 38 44  PLT 363 307 282   Lab Results  Component Value Date   TSH 0.66 02/28/2015   No results found for: HGBA1C Lab Results  Component Value Date   CHOL 211* 02/14/2014   HDL 47 02/14/2014   LDLCALC 133 02/14/2014   TRIG 155 02/14/2014    Significant Diagnostic Results in last 30 days:  No results found.  Assessment/Plan  Edema 1+ dependent edema BLE, R>L, seems worse,  BNP 22.1 01/05/15. Will add Furosemide 10mg  po daily in setting of increased SOB, update CBC, CMP, BNP  Intrinsic  asthma 04/17/15 CXR no acute cardiopulmonary findings.  04/18/15 increased SOB and edema RLE>LLE, starting Furosemide 10mg  daily. Update CBC, CMP, BNP. Continue Advair, Singulair, Fluticasone, prn Albuterol.  Situational depression 01/06/15 refused Lexapro 5mg  daily, no suicidal ideation 02/28/15 mood is stable.  04/14/15 trial of Mirtazapine 7.5mg , observe the patient.  04/18/15 seems better in her facial looks and conversation. Continue to observe.   Memory loss Memory lapses. Continue AL for care needs  Hypothyroid 11/28/14 TSH 0.694,  02/28/15 TSH 0.663, continue Synthroid 20mcg  Hypertonicity of bladder Managed with Myrbetriq, average bathroom trips 2x/night.  GERD (gastroesophageal reflux disease) Stable, continue Omeprazole 20mg  daily.     Family/ staff Communication: continue AL for care needs.   Labs/tests ordered:  CBC, CMP, BNP

## 2015-04-18 NOTE — Assessment & Plan Note (Signed)
Stable, continue Omeprazole 20mg daily.  

## 2015-04-18 NOTE — Assessment & Plan Note (Signed)
11/28/14 TSH 0.694,  02/28/15 TSH 0.663, continue Synthroid 12mcg

## 2015-04-18 NOTE — Assessment & Plan Note (Signed)
01/06/15 refused Lexapro 5mg  daily, no suicidal ideation 02/28/15 mood is stable.  04/14/15 trial of Mirtazapine 7.5mg , observe the patient.  04/18/15 seems better in her facial looks and conversation. Continue to observe.

## 2015-04-18 NOTE — Assessment & Plan Note (Signed)
Memory lapses. Continue AL for care needs 

## 2015-04-18 NOTE — Assessment & Plan Note (Signed)
Managed with Myrbetriq, average bathroom trips 2x/night. 

## 2015-04-18 NOTE — Assessment & Plan Note (Signed)
04/17/15 CXR no acute cardiopulmonary findings.  04/18/15 increased SOB and edema RLE>LLE, starting Furosemide 10mg  daily. Update CBC, CMP, BNP. Continue Advair, Singulair, Fluticasone, prn Albuterol.

## 2015-04-20 DIAGNOSIS — I5022 Chronic systolic (congestive) heart failure: Secondary | ICD-10-CM | POA: Diagnosis not present

## 2015-04-20 DIAGNOSIS — I10 Essential (primary) hypertension: Secondary | ICD-10-CM | POA: Diagnosis not present

## 2015-04-20 DIAGNOSIS — D649 Anemia, unspecified: Secondary | ICD-10-CM | POA: Diagnosis not present

## 2015-04-20 LAB — HEPATIC FUNCTION PANEL
ALT: 18 U/L (ref 7–35)
AST: 39 U/L — AB (ref 13–35)
Alkaline Phosphatase: 58 U/L (ref 25–125)
BILIRUBIN, TOTAL: 0.6 mg/dL

## 2015-04-20 LAB — BASIC METABOLIC PANEL
BUN: 25 mg/dL — AB (ref 4–21)
Creatinine: 0.8 mg/dL (ref 0.5–1.1)
Glucose: 84 mg/dL
POTASSIUM: 5.5 mmol/L — AB (ref 3.4–5.3)
SODIUM: 138 mmol/L (ref 137–147)

## 2015-04-20 LAB — CBC AND DIFFERENTIAL
HEMATOCRIT: 44 % (ref 36–46)
HEMOGLOBIN: 14.2 g/dL (ref 12.0–16.0)
Platelets: 262 10*3/uL (ref 150–399)
WBC: 8.6 10^3/mL

## 2015-04-24 DIAGNOSIS — R41841 Cognitive communication deficit: Secondary | ICD-10-CM | POA: Diagnosis not present

## 2015-04-24 DIAGNOSIS — R29898 Other symptoms and signs involving the musculoskeletal system: Secondary | ICD-10-CM | POA: Diagnosis not present

## 2015-04-24 DIAGNOSIS — M818 Other osteoporosis without current pathological fracture: Secondary | ICD-10-CM | POA: Diagnosis not present

## 2015-04-24 DIAGNOSIS — R55 Syncope and collapse: Secondary | ICD-10-CM | POA: Diagnosis not present

## 2015-04-24 DIAGNOSIS — R2681 Unsteadiness on feet: Secondary | ICD-10-CM | POA: Diagnosis not present

## 2015-04-24 DIAGNOSIS — E785 Hyperlipidemia, unspecified: Secondary | ICD-10-CM | POA: Diagnosis not present

## 2015-04-24 DIAGNOSIS — M6281 Muscle weakness (generalized): Secondary | ICD-10-CM | POA: Diagnosis not present

## 2015-04-24 LAB — BASIC METABOLIC PANEL
BUN: 24 mg/dL — AB (ref 4–21)
CREATININE: 0.9 mg/dL (ref 0.5–1.1)
Glucose: 89 mg/dL
POTASSIUM: 4.6 mmol/L (ref 3.4–5.3)
Sodium: 142 mmol/L (ref 137–147)

## 2015-04-25 ENCOUNTER — Non-Acute Institutional Stay: Payer: Medicare Other | Admitting: Internal Medicine

## 2015-04-25 VITALS — BP 120/88 | HR 59 | Temp 97.1°F | Resp 20 | Ht 59.0 in | Wt 132.2 lb

## 2015-04-25 DIAGNOSIS — M25561 Pain in right knee: Secondary | ICD-10-CM

## 2015-04-25 DIAGNOSIS — F4321 Adjustment disorder with depressed mood: Secondary | ICD-10-CM | POA: Diagnosis not present

## 2015-04-25 DIAGNOSIS — R269 Unspecified abnormalities of gait and mobility: Secondary | ICD-10-CM

## 2015-04-25 DIAGNOSIS — K219 Gastro-esophageal reflux disease without esophagitis: Secondary | ICD-10-CM

## 2015-04-25 DIAGNOSIS — R609 Edema, unspecified: Secondary | ICD-10-CM | POA: Diagnosis not present

## 2015-04-25 DIAGNOSIS — M25562 Pain in left knee: Secondary | ICD-10-CM

## 2015-04-25 MED ORDER — LIDOCAINE 4 % EX CREA: TOPICAL_CREAM | CUTANEOUS | Status: DC

## 2015-04-25 MED ORDER — MELOXICAM 15 MG PO TABS: ORAL_TABLET | ORAL | Status: DC

## 2015-04-25 NOTE — Progress Notes (Signed)
Patient ID: Kimberly Greer, female   DOB: 09-Apr-1921, 82 y.o.   MRN: [id number]   Location:  [facility]  Provider: Jeanmarie Hubert, M.D.  Code Status: Full Goals of Care:  Advanced Directives 04/25/2015  Does patient have an advance directive? Yes  Type of Advance Directive [place]  Does patient want to make changes to advanced directive? No - Patient declined  Copy of advanced directive(s) in chart? Yes     Chief Complaint  Patient presents with  . Acute Visit    not feeling well    HPI: Patient is a 82 y.o. female seen today for Acute visit.   Having knee pains bilaterally. Walking has become much more difficult.  Edema is not any better. She finds the compression stockings uncomfortable and does not want to wear them anymore. Edema is unsightly, but not uncomfortable.  Wheezy cough is a little worse.   Past Medical History  Diagnosis Date  . Asthma   . Hypothyroid   . Senile osteoporosis 06/16/2010  . Other abnormal blood chemistry 06/19/2010    hyperglycemia  . Pain in limb 12/05/2009  . Other and unspecified hyperlipidemia 10/24/2009  . Syncope and collapse 05/30/2009  . Carpal tunnel syndrome 03/07/2009  . Sensorineural hearing loss, unspecified 02/21/2009  . Unspecified nasal polyp 02/21/2009  . Disturbance of skin sensation 02/21/2009  . Unspecified urinary incontinence 02/21/2009  . Diverticulosis of colon (without mention of hemorrhage) 02/16/2009  . 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 Other atopic dermatitis and related conditions 02/16/2009  . Osteoarthrosis, unspecified whether generalized or localized, unspecified site 02/16/2009  . Otosclerosis, unspecified 02/22/2003  . Fracture of upper end of left humerus 11/18/14    Past Surgical History  Procedure Laterality Date  . Breast biopsy  07/16/1990    benign left breast needle biopsy  Dr. Margot Chimes  . Abdominal hysterectomy  1999    Dr. Collier Bullock  . Cataract extraction w/ intraocular lens   implant, bilateral Bilateral   . Carpal tunnel release  04/07/2009    right Dr. Daylene Katayama  . Trigger finger release Left 08/09/2014    Procedure: RELEASE TRIGGER FINGER/A-1 PULLEY LEFT MIDDLE FINGER;  Surgeon: Daryll Brod, MD;  Location: [facility];  Service: Orthopedics;  Laterality: Left;  REGIONAL/FAB    Allergies  Allergen Reactions  . Avelox [Moxifloxacin Hcl In Nacl] Itching  . Doxycycline   . Hista-Tabs [Triprolidine-Pse] Other (See Comments)    Passed out  . Septra [Sulfamethoxazole-Trimethoprim]   . Sulfa Antibiotics     Itching/rash (all over)      Medication List       This list is accurate as of: 04/25/15  9:54 AM.  Always use your most recent med list.               acetaminophen 325 MG tablet  Commonly known as:  TYLENOL  Take 2 tablets (650 mg total) by mouth every 6 (six) hours as needed for mild pain or moderate pain.     feeding supplement Liqd  Take 1 Container by mouth 3 (three) times daily between meals.     fluticasone 50 MCG/ACT nasal spray  Commonly known as:  FLONASE  Shake well before each use and instill one to two sprays in each nostril once daily to control congestion     Fluticasone-Salmeterol 250-50 MCG/DOSE Aepb  Commonly known as:  ADVAIR DISKUS  Inhale once daily to help breathing     furosemide 20 MG tablet  Commonly known as:  LASIX  Take 10 mg by mouth daily. Reported on 04/25/2015     levothyroxine 50 MCG tablet  Commonly known as:  SYNTHROID, LEVOTHROID  Take one tablet by mouth once daily for thyroid     mirtazapine 7.5 MG tablet  Commonly known as:  REMERON  Take 7.5 mg by mouth at bedtime.     montelukast 10 MG tablet  Commonly known as:  SINGULAIR  Take one tablet by mouth once daily to help breathing     multivitamin with minerals tablet  Take 1 tablet by mouth daily. Take 1 tablet as a vitamin supplement.     MYRBETRIQ 25 MG Tb24 tablet  Generic drug:  mirabegron ER  TAKE ONE TABLET BY MOUTH ONCE DAILY       omeprazole 20 MG capsule  Commonly known as:  PRILOSEC  Take 20 mg by mouth daily. Take 1 tablet daily for heartburn or stomach reflux.     raloxifene 60 MG tablet  Commonly known as:  EVISTA  Take 1 tablet by mouth  daily to treat osteoporosis     VENTOLIN HFA 108 (90 Base) MCG/ACT inhaler  Generic drug:  albuterol  Inhale 2 puffs by mouth every 6 hours as needed for shortness of breath     VIACTIV 500-500-40 MG-UNT-MCG Chew  Generic drug:  Calcium-Vitamin D-Vitamin K  Chew 1 tablet by mouth daily. Take 1 tablet daily for calcium.        Review of Systems:  Review of Systems  Constitutional: Positive for activity change, appetite change, fatigue and unexpected weight change. Negative for fever, chills and diaphoresis.  HENT: Positive for hearing loss. Negative for congestion, ear discharge, ear pain, nosebleeds, postnasal drip, rhinorrhea, sore throat, tinnitus, trouble swallowing and voice change.   Eyes: Negative for pain, redness, itching and visual disturbance.  Respiratory: Positive for cough (wheezing and rattling) and shortness of breath. Negative for choking, wheezing and stridor.   Cardiovascular: Positive for leg swelling. Negative for chest pain and palpitations.       Trace BLE  Gastrointestinal: Negative for nausea, abdominal pain, diarrhea, constipation and abdominal distention.  Endocrine: Negative for cold intolerance, heat intolerance, polydipsia, polyphagia and polyuria.  Genitourinary: Negative for dysuria, urgency, frequency, hematuria, flank pain, vaginal discharge, difficulty urinating and pelvic pain.  Musculoskeletal: Positive for arthralgias (Bilateral knee discomfort. No joint effusion. Some crepitance.) and gait problem (unstable. Using walker with 2 front wheels and skids). Negative for myalgias, back pain, neck pain and neck stiffness.       Scar right palm from release of 4th finger contracture Fracture left shoulder October 2016. Mild residual  discomfort.  Skin: Negative.  Negative for color change, pallor and rash.  Allergic/Immunologic: Negative.   Neurological: Negative for dizziness, tremors, seizures, syncope, weakness, numbness and headaches.  Hematological: Negative for adenopathy. Does not bruise/bleed easily.  Psychiatric/Behavioral: Positive for dysphoric mood (depressed. Previously on Lexapro, but she refused to take it. Now on a trial of mirtazapine 7.5 mg.). Negative for suicidal ideas, hallucinations, behavioral problems, confusion, sleep disturbance and agitation. The patient is not nervous/anxious and is not hyperactive.     Health Maintenance  Topic Date Due  . ZOSTAVAX  04/21/1983  . DEXA SCAN  04/20/1988  . PNA vac Low Risk Adult (2 of 2 - PCV13) 06/03/2009  . INFLUENZA VACCINE  09/05/2015  . TETANUS/TDAP  06/04/2018    Physical Exam: Filed Vitals:   04/25/15 0944  BP: 120/88  Pulse: 59  Temp: 97.1 F (36.2 C)  TempSrc: Oral  Resp: 20  Height: 4\' 11"  (1.499 m)  Weight: 132 lb 3.2 oz (59.966 kg)  SpO2: 98%   Body mass index is 26.69 kg/(m^2). Physical Exam  Constitutional: She is oriented to person, place, and time. She appears well-developed and well-nourished. No distress.  HENT:  Head: Normocephalic and atraumatic.  Right Ear: External ear normal.  Left Ear: External ear normal.  Nose: Nose normal.  Mouth/Throat: Oropharynx is clear and moist.  Mild hearing deficit  Eyes:  Corrective lenses  Neck: No JVD present. No tracheal deviation present. No thyromegaly present.  Cardiovascular: Normal rate, regular rhythm, normal heart sounds and intact distal pulses.  Exam reveals no gallop and no friction rub.   No murmur heard. Pulmonary/Chest: No respiratory distress. She has wheezes. She has rales. She exhibits no tenderness.  Coughing  Abdominal: She exhibits no distension and no mass. There is no tenderness.  Musculoskeletal: She exhibits edema and tenderness.  Unstable gait. Using walker  with 2 front wheels and rear skids. Right 4th finger contracture has been released. Left shoulder fx 11/06/14, decreased ROM and pain with movement. 1+ edema seen BLE, R>L Bilateral knee pain with mild crepitance, but no effusion.  Lymphadenopathy:    She has no cervical adenopathy.  Neurological: She is alert and oriented to person, place, and time. No cranial nerve deficit. Coordination normal.  10/27/12 MMSE 28/30. Passed clock drawing.  Skin: No rash noted. No erythema. No pallor.  Psychiatric: She has a normal mood and affect. Her behavior is normal. Judgment and thought content normal.    Labs reviewed: Basic Metabolic Panel:  Recent Labs  11/28/14 01/05/15 02/28/15 04/20/15  NA 140 141 136* 138  K 4.5 4.3 4.1 5.5*  BUN 13 19 16  25*  CREATININE 0.8 0.8 0.8 0.8  TSH 0.69  --  0.66  --    Liver Function Tests:  Recent Labs  01/05/15 02/28/15 04/20/15  AST 16 20 39*  ALT 17 25 18   ALKPHOS 63 56 58   No results for input(s): LIPASE, AMYLASE in the last 8760 hours. No results for input(s): AMMONIA in the last 8760 hours. CBC:  Recent Labs  01/05/15 02/28/15 04/20/15  WBC 6.7 18.6 8.6  HGB 12.5 14.0 14.2  HCT 38 44 44  PLT 307 282 262    Assessment/Plan  1. Bilateral knee pain - meloxicam (MOBIC) 15 MG tablet; One daily to help arthritis  Dispense: 30 tablet; Refill: 5 - lidocaine (ASPERCREME W/LIDOCAINE) 4 % cream; Apply 4 times daily to painful joints  Dispense: 133 g; Refill: 5  2. Abnormality of gait Continue to use walker  3. Edema, unspecified type Patient is already on furosemide 20 mg daily. I am reluctant to increase the dose at this time. Edema is not uncomfortable. i will defer to watching it.  4. Situational depression Continue mirtazapine  5. Gastroesophageal reflux disease without esophagitis Continue omeprazole

## 2015-04-28 DIAGNOSIS — R55 Syncope and collapse: Secondary | ICD-10-CM | POA: Diagnosis not present

## 2015-04-28 DIAGNOSIS — M818 Other osteoporosis without current pathological fracture: Secondary | ICD-10-CM | POA: Diagnosis not present

## 2015-04-28 DIAGNOSIS — R29898 Other symptoms and signs involving the musculoskeletal system: Secondary | ICD-10-CM | POA: Diagnosis not present

## 2015-04-28 DIAGNOSIS — M6281 Muscle weakness (generalized): Secondary | ICD-10-CM | POA: Diagnosis not present

## 2015-04-28 DIAGNOSIS — R2681 Unsteadiness on feet: Secondary | ICD-10-CM | POA: Diagnosis not present

## 2015-04-28 DIAGNOSIS — R41841 Cognitive communication deficit: Secondary | ICD-10-CM | POA: Diagnosis not present

## 2015-05-08 DIAGNOSIS — N3281 Overactive bladder: Secondary | ICD-10-CM | POA: Diagnosis not present

## 2015-05-08 DIAGNOSIS — M6281 Muscle weakness (generalized): Secondary | ICD-10-CM | POA: Diagnosis not present

## 2015-05-08 DIAGNOSIS — R55 Syncope and collapse: Secondary | ICD-10-CM | POA: Diagnosis not present

## 2015-05-08 DIAGNOSIS — M818 Other osteoporosis without current pathological fracture: Secondary | ICD-10-CM | POA: Diagnosis not present

## 2015-05-08 DIAGNOSIS — S42292A Other displaced fracture of upper end of left humerus, initial encounter for closed fracture: Secondary | ICD-10-CM | POA: Diagnosis not present

## 2015-05-08 DIAGNOSIS — R2681 Unsteadiness on feet: Secondary | ICD-10-CM | POA: Diagnosis not present

## 2015-05-08 DIAGNOSIS — R29898 Other symptoms and signs involving the musculoskeletal system: Secondary | ICD-10-CM | POA: Diagnosis not present

## 2015-05-08 DIAGNOSIS — E039 Hypothyroidism, unspecified: Secondary | ICD-10-CM | POA: Diagnosis not present

## 2015-05-16 ENCOUNTER — Encounter: Payer: Self-pay | Admitting: Internal Medicine

## 2015-05-16 ENCOUNTER — Non-Acute Institutional Stay: Payer: Medicare Other | Admitting: Internal Medicine

## 2015-05-16 VITALS — BP 148/76 | HR 89 | Temp 97.7°F | Ht 59.0 in | Wt 134.0 lb

## 2015-05-16 DIAGNOSIS — M25561 Pain in right knee: Secondary | ICD-10-CM | POA: Diagnosis not present

## 2015-05-16 DIAGNOSIS — F4321 Adjustment disorder with depressed mood: Secondary | ICD-10-CM | POA: Diagnosis not present

## 2015-05-16 DIAGNOSIS — R609 Edema, unspecified: Secondary | ICD-10-CM | POA: Diagnosis not present

## 2015-05-16 DIAGNOSIS — R0600 Dyspnea, unspecified: Secondary | ICD-10-CM

## 2015-05-16 DIAGNOSIS — K219 Gastro-esophageal reflux disease without esophagitis: Secondary | ICD-10-CM | POA: Diagnosis not present

## 2015-05-16 DIAGNOSIS — M25562 Pain in left knee: Secondary | ICD-10-CM

## 2015-05-16 NOTE — Progress Notes (Signed)
Patient ID: Kimberly Greer, female   DOB: [birth year], 82 y.o.   MRN: [id number]   Location:  [place] Room Number: AL 11 Place of Service:  Clinic [phone])  Provider: Jeanmarie Hubert, M.D.  Code Status: Full Goals of Care:  Advanced Directives 05/16/2015  Does patient have an advance directive? Yes  Type of Paramedic of [place];Living will  Does patient want to make changes to advanced directive? -  Copy of advanced directive(s) in chart? Yes     Chief Complaint  Patient presents with  . Medical Management of Chronic Issues    2 week follow up on kne pain and edema  . Shortness of Breath    with walking     HPI: Patient is a 82 y.o. female seen today for medical management of chronic diseases.    Patient was last seen 04/25/2015 for an acute visit for bilateral knee pain, abnormality, edema, depression, and GERD. She was started on meloxicam 15 mg daily and Aspercreme with lidocaine for her painful joints.  Dyspnea - continues with DOE, but she thinks it is a little better.  Edema, unspecified type - unchanged  Bilateral knee pain - a little better since the addition of Meloxicam and Aspercream.  Situational depression - stable  Gastroesophageal reflux disease without esophagitis - asymptomatic. No new symptoms since starting meloxicam   Past Medical History  Diagnosis Date  . Asthma   . Hypothyroid   . Senile osteoporosis 06/16/2010  . Other abnormal blood chemistry 06/19/2010    hyperglycemia  . Pain in limb 12/05/2009  . Other and unspecified hyperlipidemia 10/24/2009  . Syncope and collapse 05/30/2009  . Carpal tunnel syndrome 03/07/2009  . Sensorineural hearing loss, unspecified 02/21/2009  . Unspecified nasal polyp 02/21/2009  . Disturbance of skin sensation 02/21/2009  . Unspecified urinary incontinence 02/21/2009  . Diverticulosis of colon (without mention of hemorrhage) 02/16/2009  . Hypertonicity of bladder 02/16/2009    . Other atopic dermatitis and related conditions 02/16/2009  . Osteoarthrosis, unspecified whether generalized or localized, unspecified site 02/16/2009  . Otosclerosis, unspecified 02/22/2003  . Fracture of upper end of left humerus 11/18/14    Past Surgical History  Procedure Laterality Date  . Breast biopsy  07/16/1990    benign left breast needle biopsy  Dr. Margot Chimes  . Abdominal hysterectomy  1999    Dr. Collier Bullock  . Cataract extraction w/ intraocular lens  implant, bilateral Bilateral   . Carpal tunnel release  04/07/2009    right Dr. Daylene Katayama  . Trigger finger release Left 08/09/2014    Procedure: RELEASE TRIGGER FINGER/A-1 PULLEY LEFT MIDDLE FINGER;  Surgeon: Daryll Brod, MD;  Location: [place];  Service: Orthopedics;  Laterality: Left;  REGIONAL/FAB    Allergies  Allergen Reactions  . Avelox [Moxifloxacin Hcl In Nacl] Itching  . Doxycycline   . Hista-Tabs [Triprolidine-Pse] Other (See Comments)    Passed out  . Septra [Sulfamethoxazole-Trimethoprim]   . Sulfa Antibiotics     Itching/rash (all over)      Medication List       This list is accurate as of: 05/16/15 10:10 AM.  Always use your most recent med list.               acetaminophen 325 MG tablet  Commonly known as:  TYLENOL  Take 2 tablets (650 mg total) by mouth every 6 (six) hours as needed for mild pain or moderate pain.  feeding supplement Liqd  Take 1 Container by mouth 3 (three) times daily between meals.     fluticasone 50 MCG/ACT nasal spray  Commonly known as:  FLONASE  Shake well before each use and instill one to two sprays in each nostril once daily to control congestion     Fluticasone-Salmeterol 250-50 MCG/DOSE Aepb  Commonly known as:  ADVAIR DISKUS  Inhale once daily to help breathing     furosemide 20 MG tablet  Commonly known as:  LASIX  Take 10 mg by mouth daily. Reported on 04/25/2015     levothyroxine 50 MCG tablet  Commonly known as:  SYNTHROID, LEVOTHROID  Take  one tablet by mouth once daily for thyroid     lidocaine 4 % cream  Commonly known as:  ASPERCREME W/LIDOCAINE  Apply 4 times daily to painful joints     meloxicam 15 MG tablet  Commonly known as:  MOBIC  One daily to help arthritis     mirtazapine 7.5 MG tablet  Commonly known as:  REMERON  Take 7.5 mg by mouth at bedtime.     montelukast 10 MG tablet  Commonly known as:  SINGULAIR  Take one tablet by mouth once daily to help breathing     multivitamin with minerals tablet  Take 1 tablet by mouth daily. Take 1 tablet as a vitamin supplement.     MYRBETRIQ 25 MG Tb24 tablet  Generic drug:  mirabegron ER  TAKE ONE TABLET BY MOUTH ONCE DAILY     omeprazole 20 MG capsule  Commonly known as:  PRILOSEC  Take 20 mg by mouth daily. Take 1 tablet daily for heartburn or stomach reflux.     raloxifene 60 MG tablet  Commonly known as:  EVISTA  Take 1 tablet by mouth  daily to treat osteoporosis     VENTOLIN HFA 108 (90 Base) MCG/ACT inhaler  Generic drug:  albuterol  Inhale 2 puffs by mouth every 6 hours as needed for shortness of breath     VIACTIV 500-500-40 MG-UNT-MCG Chew  Generic drug:  Calcium-Vitamin D-Vitamin K  Chew 1 tablet by mouth daily. Take 1 tablet daily for calcium.        Review of Systems:  Review of Systems  Constitutional: Positive for activity change, appetite change, fatigue and unexpected weight change. Negative for fever, chills and diaphoresis.  HENT: Positive for hearing loss. Negative for congestion, ear discharge, ear pain, nosebleeds, postnasal drip, rhinorrhea, sore throat, tinnitus, trouble swallowing and voice change.   Eyes: Negative for pain, redness, itching and visual disturbance.  Respiratory: Positive for cough (wheezing and rattling) and shortness of breath (on exertion). Negative for choking, wheezing and stridor.   Cardiovascular: Positive for leg swelling (2+ bipedal). Negative for chest pain and palpitations.  Gastrointestinal:  Negative for nausea, abdominal pain, diarrhea, constipation and abdominal distention.  Endocrine: Negative for cold intolerance, heat intolerance, polydipsia, polyphagia and polyuria.  Genitourinary: Negative for dysuria, urgency, frequency, hematuria, flank pain, vaginal discharge, difficulty urinating and pelvic pain.  Musculoskeletal: Positive for arthralgias (Bilateral knee discomfort. No joint effusion. Some crepitance.) and gait problem (unstable. Using walker with 2 front wheels and skids). Negative for myalgias, back pain, neck pain and neck stiffness.       Scar right palm from release of 4th finger contracture Fracture left shoulder October 2016. Mild residual discomfort.  Skin: Negative.  Negative for color change, pallor and rash.  Allergic/Immunologic: Negative.   Neurological: Negative for dizziness, tremors, seizures, syncope, weakness, numbness  and headaches.  Hematological: Negative for adenopathy. Does not bruise/bleed easily.  Psychiatric/Behavioral: Positive for dysphoric mood (depressed. Previously on Lexapro, but she refused to take it. Now on a trial of mirtazapine 7.5 mg.). Negative for suicidal ideas, hallucinations, behavioral problems, confusion, sleep disturbance and agitation. The patient is not nervous/anxious and is not hyperactive.     Health Maintenance  Topic Date Due  . ZOSTAVAX  04/21/1983  . DEXA SCAN  04/20/1988  . PNA vac Low Risk Adult (2 of 2 - PCV13) 06/03/2009  . INFLUENZA VACCINE  09/05/2015  . TETANUS/TDAP  06/04/2018    Physical Exam: Filed Vitals:   05/16/15 0934  BP: 148/76  Pulse: 89  Temp: 97.7 F (36.5 C)  TempSrc: Oral  Height: 4\' 11"  (1.499 m)  Weight: 134 lb (60.782 kg)  SpO2: 99%   Body mass index is 27.05 kg/(m^2). Physical Exam  Constitutional: She is oriented to person, place, and time. She appears well-developed and well-nourished. No distress.  HENT:  Head: Normocephalic and atraumatic.  Right Ear: External ear normal.   Left Ear: External ear normal.  Nose: Nose normal.  Mouth/Throat: Oropharynx is clear and moist.  Mild hearing deficit  Eyes:  Corrective lenses  Neck: No JVD present. No tracheal deviation present. No thyromegaly present.  Cardiovascular: Normal rate, regular rhythm, normal heart sounds and intact distal pulses.  Exam reveals no gallop and no friction rub.   No murmur heard. Pulmonary/Chest: No respiratory distress. She has no wheezes. She has rales. She exhibits no tenderness.  Coughing  Abdominal: She exhibits no distension and no mass. There is no tenderness.  Musculoskeletal: She exhibits edema (2+ bipedal edema) and tenderness.  Unstable gait. Using walker with 2 front wheels and rear skids. Right 4th finger contracture has been released. Left shoulder fx 11/06/14, decreased ROM and pain with movement. 1+ edema seen BLE, R>L Bilateral knee pain with mild crepitance, but no effusion.  Lymphadenopathy:    She has no cervical adenopathy.  Neurological: She is alert and oriented to person, place, and time. No cranial nerve deficit. Coordination normal.  10/27/12 MMSE 28/30. Passed clock drawing.  Skin: No rash noted. No erythema. No pallor.  Psychiatric: She has a normal mood and affect. Her behavior is normal. Judgment and thought content normal.    Labs reviewed: Basic Metabolic Panel:  Recent Labs  11/28/14  02/28/15 04/20/15 04/24/15  NA 140  < > 136* 138 142  K 4.5  < > 4.1 5.5* 4.6  BUN 13  < > 16 25* 24*  CREATININE 0.8  < > 0.8 0.8 0.9  TSH 0.69  --  0.66  --   --   < > = values in this interval not displayed. Liver Function Tests:  Recent Labs  01/05/15 02/28/15 04/20/15  AST 16 20 39*  ALT 17 25 18   ALKPHOS 63 56 58   No results for input(s): LIPASE, AMYLASE in the last 8760 hours. No results for input(s): AMMONIA in the last 8760 hours. CBC:  Recent Labs  01/05/15 02/28/15 04/20/15  WBC 6.7 18.6 8.6  HGB 12.5 14.0 14.2  HCT 38 44 44  PLT 307 282 262      Assessment/Plan 1. Dyspnea stable  2. Edema, unspecified type unchanged  3. Bilateral knee pain A little better  4. Situational depression Stable on mirtaziaine  5. Gastroesophageal reflux disease without esophagitis asymptomatic

## 2015-05-19 DIAGNOSIS — R29898 Other symptoms and signs involving the musculoskeletal system: Secondary | ICD-10-CM | POA: Diagnosis not present

## 2015-05-19 DIAGNOSIS — M6281 Muscle weakness (generalized): Secondary | ICD-10-CM | POA: Diagnosis not present

## 2015-05-19 DIAGNOSIS — E039 Hypothyroidism, unspecified: Secondary | ICD-10-CM | POA: Diagnosis not present

## 2015-05-19 DIAGNOSIS — M818 Other osteoporosis without current pathological fracture: Secondary | ICD-10-CM | POA: Diagnosis not present

## 2015-05-19 DIAGNOSIS — S42292A Other displaced fracture of upper end of left humerus, initial encounter for closed fracture: Secondary | ICD-10-CM | POA: Diagnosis not present

## 2015-05-19 DIAGNOSIS — R2681 Unsteadiness on feet: Secondary | ICD-10-CM | POA: Diagnosis not present

## 2015-05-30 ENCOUNTER — Encounter: Payer: Medicare Other | Admitting: Internal Medicine

## 2015-05-30 ENCOUNTER — Non-Acute Institutional Stay: Payer: Medicare Other | Admitting: Internal Medicine

## 2015-05-30 ENCOUNTER — Encounter: Payer: Self-pay | Admitting: Internal Medicine

## 2015-05-30 DIAGNOSIS — S51812A Laceration without foreign body of left forearm, initial encounter: Secondary | ICD-10-CM

## 2015-05-30 DIAGNOSIS — R609 Edema, unspecified: Secondary | ICD-10-CM

## 2015-05-30 DIAGNOSIS — S0003XA Contusion of scalp, initial encounter: Secondary | ICD-10-CM | POA: Diagnosis not present

## 2015-05-30 DIAGNOSIS — S51819A Laceration without foreign body of unspecified forearm, initial encounter: Secondary | ICD-10-CM | POA: Insufficient documentation

## 2015-05-30 DIAGNOSIS — M25562 Pain in left knee: Secondary | ICD-10-CM

## 2015-05-30 DIAGNOSIS — M25561 Pain in right knee: Secondary | ICD-10-CM

## 2015-05-30 DIAGNOSIS — R0600 Dyspnea, unspecified: Secondary | ICD-10-CM | POA: Diagnosis not present

## 2015-05-30 MED ORDER — FUROSEMIDE 40 MG PO TABS: ORAL_TABLET | ORAL | Status: DC

## 2015-05-30 NOTE — Progress Notes (Signed)
Patient ID: Kimberly Greer, female   DOB: 11-21-1921, 82 y.o.   MRN: [id number]    [place] Room Number: Al 11  Place of Service: ALF (13)     Allergies  Allergen Reactions  . Avelox [Moxifloxacin Hcl In Nacl] Itching  . Doxycycline   . Hista-Tabs [Triprolidine-Pse] Other (See Comments)    Passed out  . Septra [Sulfamethoxazole-Trimethoprim]   . Sulfa Antibiotics     Itching/rash (all over)    Chief Complaint  Patient presents with  . Acute Visit    fall, skin teare, hematoma left parietal area, dyspne, edema    HPI:  Patient fell in her bathroom this morning. She is not sure whether she passed out. If she passed out, she says it was only for a second or 2. Staff found her slumped on the floor of the hematoma in the left parietal area and a skin tear of the left forearm. She did not hurt her back, hips, knees.  Patient says she feels quite shaken by the fall. States "copy of a surprise as Crohn's.  Continues to have dyspnea on exertion as well as nocturnal dyspnea. Sometimes she has to sit up in bed. Ankles remain swollen 2+ bipedal. It appears that she had a dose reduction in her furosemide to 10 mg on 04/18/2015. She has had a chest x-ray on 04/17/2015 that showed no acute cardiopulmonary findings. Chronic linear interstitial prominence and hyperlucency was noted from chronic lung disease. He did not appear to be any pulmonary venous congestion.  Medications: Patient's Medications  New Prescriptions   No medications on file  Previous Medications   ACETAMINOPHEN (TYLENOL) 325 MG TABLET    Take 2 tablets (650 mg total) by mouth every 6 (six) hours as needed for mild pain or moderate pain.   ALBUTEROL (VENTOLIN HFA) 108 (90 BASE) MCG/ACT INHALER    Inhale 2 puffs by mouth every 6 hours as needed for shortness of breath   CALCIUM-VITAMIN D-VITAMIN K (VIACTIV) 500-500-40 MG-UNT-MCG CHEW    Chew 1 tablet by mouth daily. Take 1 tablet daily for  calcium.   FEEDING SUPPLEMENT (BOOST HIGH PROTEIN) LIQD    Take 1 Container by mouth 3 (three) times daily between meals.   FLUTICASONE (FLONASE) 50 MCG/ACT NASAL SPRAY    Shake well before each use and instill one to two sprays in each nostril once daily to control congestion   FLUTICASONE-SALMETEROL (ADVAIR DISKUS) 250-50 MCG/DOSE AEPB    Inhale once daily to help breathing   FUROSEMIDE (LASIX) 20 MG TABLET    Take 10 mg by mouth daily. Reported on 04/25/2015   LEVOTHYROXINE (SYNTHROID, LEVOTHROID) 50 MCG TABLET    Take one tablet by mouth once daily for thyroid   LIDOCAINE (ASPERCREME W/LIDOCAINE) 4 % CREAM    Apply 4 times daily to painful joints   MELOXICAM (MOBIC) 15 MG TABLET    One daily to help arthritis   MIRTAZAPINE (REMERON) 7.5 MG TABLET    Take 7.5 mg by mouth at bedtime.   MONTELUKAST (SINGULAIR) 10 MG TABLET    Take one tablet by mouth once daily to help breathing   MULTIPLE VITAMINS-MINERALS (MULTIVITAMIN WITH MINERALS) TABLET    Take 1 tablet by mouth daily. Take 1 tablet as a vitamin supplement.   MYRBETRIQ 25 MG TB24 TABLET    TAKE ONE TABLET BY MOUTH ONCE DAILY   OMEPRAZOLE (PRILOSEC) 20 MG CAPSULE    Take 20 mg by mouth daily.  Take 1 tablet daily for heartburn or stomach reflux.   RALOXIFENE (EVISTA) 60 MG TABLET    Take 1 tablet by mouth  daily to treat osteoporosis  Modified Medications   No medications on file  Discontinued Medications   No medications on file     Review of Systems  Constitutional: Positive for activity change, appetite change, fatigue and unexpected weight change. Negative for fever, chills and diaphoresis.  HENT: Positive for hearing loss. Negative for congestion, ear discharge, ear pain, nosebleeds, postnasal drip, rhinorrhea, sore throat, tinnitus, trouble swallowing and voice change.   Eyes: Negative for pain, redness, itching and visual disturbance.  Respiratory: Positive for cough (wheezing and rattling) and shortness of breath (on exertion).  Negative for choking, wheezing and stridor.        Dyspnea with exertion. Sometimes short of breath tonight after she lays down.  Cardiovascular: Positive for leg swelling (2+ bipedal). Negative for chest pain and palpitations.  Gastrointestinal: Negative for nausea, abdominal pain, diarrhea, constipation and abdominal distention.  Endocrine: Negative for cold intolerance, heat intolerance, polydipsia, polyphagia and polyuria.  Genitourinary: Negative for dysuria, urgency, frequency, hematuria, flank pain, vaginal discharge, difficulty urinating and pelvic pain.  Musculoskeletal: Positive for arthralgias (Bilateral knee discomfort. No joint effusion. Some crepitance.) and gait problem (unstable. Using walker with 2 front wheels and skids). Negative for myalgias, back pain, neck pain and neck stiffness.       Scar right palm from release of 4th finger contracture Fracture left shoulder October 2016. Mild residual discomfort. Fall 05/30/2015 with injury sustained to her left parietal area (hematoma), and skin tear of the left forearm laterally.  Skin: Negative.  Negative for color change, pallor and rash.  Allergic/Immunologic: Negative.   Neurological: Negative for dizziness, tremors, seizures, syncope, weakness, numbness and headaches.  Hematological: Negative for adenopathy. Does not bruise/bleed easily.  Psychiatric/Behavioral: Positive for dysphoric mood (depressed. Previously on Lexapro, but she refused to take it. Now on a trial of mirtazapine 7.5 mg.). Negative for suicidal ideas, hallucinations, behavioral problems, confusion, sleep disturbance and agitation. The patient is not nervous/anxious and is not hyperactive.     Filed Vitals:   05/30/15 1248  BP: 166/80  Pulse: 86  Temp: 98.3 F (36.8 C)  Resp: 20  Height: _0  (1.499 m)  Weight: 134 lb (60.782 kg)  SpO2: 90%   Wt Readings from Last 3 Encounters:  05/30/15 134 lb (60.782 kg)  05/16/15 134 lb (60.782 kg)  04/25/15 132  lb 3.2 oz (59.966 kg)    Body mass index is 27.05 kg/(m^2).  Physical Exam  Constitutional: She is oriented to person, place, and time. She appears well-developed and well-nourished. No distress.  HENT:  Head: Normocephalic and atraumatic.  Right Ear: External ear normal.  Left Ear: External ear normal.  Nose: Nose normal.  Mouth/Throat: Oropharynx is clear and moist.  Mild hearing deficit  Eyes:  Corrective lenses  Neck: No JVD present. No tracheal deviation present. No thyromegaly present.  Cardiovascular: Normal rate, regular rhythm, normal heart sounds and intact distal pulses.  Exam reveals no gallop and no friction rub.   No murmur heard. Pulmonary/Chest: No respiratory distress. She has no wheezes. She has rales. She exhibits no tenderness.  Coughing  Abdominal: She exhibits no distension and no mass. There is no tenderness.  Musculoskeletal: She exhibits edema (2+ bipedal edema) and tenderness.  Unstable gait. Using walker with 2 front wheels and rear skids. Right 4th finger contracture has been released. Left shoulder fx  11/06/14, decreased ROM and pain with movement. 1+ edema seen BLE, R>L Bilateral knee pain with mild crepitance, but no effusion.  Lymphadenopathy:    She has no cervical adenopathy.  Neurological: She is alert and oriented to person, place, and time. No cranial nerve deficit. Coordination normal.  10/27/12 MMSE 28/30. Passed clock drawing.  Skin: No rash noted. No erythema. No pallor.  Hematoma left parietal scalp. Skin tear left elbow and upper forearm laterally.  Psychiatric: She has a normal mood and affect. Her behavior is normal. Judgment and thought content normal.     Labs reviewed: Lab Summary Latest Ref Rng 04/24/2015 04/20/2015 02/28/2015 01/05/2015  Hemoglobin 12.0 - 16.0 g/dL (None) 14.2 14.0 12.5  Hematocrit 36 - 46 % (None) 44 44 38  White count - (None) 8.6 18.6 6.7  Platelet count 150 - 399 K/L (None) 262 282 307  Sodium 137 - 147  mmol/L 142 138 136(A) 141  Potassium 3.4 - 5.3 mmol/L 4.6 5.5(A) 4.1 4.3  Calcium - (None) (None) (None) (None)  Phosphorus - (None) (None) (None) (None)  Creatinine 0.5 - 1.1 mg/dL 0.9 0.8 0.8 0.8  AST 13 - 35 U/L (None) 39(A) 20 16  Alk Phos 25 - 125 U/L (None) 58 56 63  Bilirubin - (None) (None) (None) (None)  Glucose - 89 84 21 90  Cholesterol - (None) (None) (None) (None)  HDL cholesterol - (None) (None) (None) (None)  Triglycerides - (None) (None) (None) (None)  LDL Direct - (None) (None) (None) (None)  LDL Calc - (None) (None) (None) (None)  Total protein - (None) (None) (None) (None)  Albumin - (None) (None) (None) (None)   Lab Results  Component Value Date   TSH 0.66 02/28/2015   Lab Results  Component Value Date   BUN 24* 04/24/2015   BUN 25* 04/20/2015   BUN 16 02/28/2015   Lab Results  Component Value Date   CREATININE 0.9 04/24/2015   CREATININE 0.8 04/20/2015   CREATININE 0.8 02/28/2015   No results found for: HGBA1C     Assessment/Plan  1. Contusion of scalp, initial encounter Observe  2. Skin tear of forearm without complication, left, initial encounter Steri-Strips. Change bandage daily.  3. Edema, unspecified type - furosemide (LASIX) 40 MG tablet; One daily to help control edema  Dispense: 30 tablet; Refill: 3  4. Dyspnea Increased furosemide  5. Bilateral knee pain Improved with the addition of Aspercreme + lidocaine and meloxicam

## 2015-06-05 DIAGNOSIS — R7989 Other specified abnormal findings of blood chemistry: Secondary | ICD-10-CM | POA: Diagnosis not present

## 2015-06-22 DIAGNOSIS — E039 Hypothyroidism, unspecified: Secondary | ICD-10-CM | POA: Diagnosis not present

## 2015-06-22 DIAGNOSIS — I10 Essential (primary) hypertension: Secondary | ICD-10-CM | POA: Diagnosis not present

## 2015-07-04 ENCOUNTER — Encounter: Payer: Self-pay | Admitting: Nurse Practitioner

## 2015-07-04 ENCOUNTER — Non-Acute Institutional Stay: Payer: Medicare Other | Admitting: Nurse Practitioner

## 2015-07-04 DIAGNOSIS — R609 Edema, unspecified: Secondary | ICD-10-CM

## 2015-07-04 DIAGNOSIS — K219 Gastro-esophageal reflux disease without esophagitis: Secondary | ICD-10-CM | POA: Diagnosis not present

## 2015-07-04 DIAGNOSIS — R413 Other amnesia: Secondary | ICD-10-CM

## 2015-07-04 DIAGNOSIS — D649 Anemia, unspecified: Secondary | ICD-10-CM | POA: Diagnosis not present

## 2015-07-04 DIAGNOSIS — N318 Other neuromuscular dysfunction of bladder: Secondary | ICD-10-CM

## 2015-07-04 DIAGNOSIS — E039 Hypothyroidism, unspecified: Secondary | ICD-10-CM

## 2015-07-04 DIAGNOSIS — J4520 Mild intermittent asthma, uncomplicated: Secondary | ICD-10-CM | POA: Diagnosis not present

## 2015-07-04 DIAGNOSIS — F4321 Adjustment disorder with depressed mood: Secondary | ICD-10-CM | POA: Diagnosis not present

## 2015-07-04 DIAGNOSIS — I10 Essential (primary) hypertension: Secondary | ICD-10-CM | POA: Diagnosis not present

## 2015-07-04 DIAGNOSIS — N390 Urinary tract infection, site not specified: Secondary | ICD-10-CM | POA: Diagnosis not present

## 2015-07-04 DIAGNOSIS — R509 Fever, unspecified: Secondary | ICD-10-CM | POA: Insufficient documentation

## 2015-07-04 LAB — CBC AND DIFFERENTIAL
HCT: 39 % (ref 36–46)
HEMOGLOBIN: 12.9 g/dL (ref 12.0–16.0)
Platelets: 272 10*3/uL (ref 150–399)
WBC: 14.7 10^3/mL

## 2015-07-04 LAB — HEPATIC FUNCTION PANEL
ALT: 12 U/L (ref 7–35)
AST: 18 U/L (ref 13–35)
Alkaline Phosphatase: 62 U/L (ref 25–125)
Bilirubin, Total: 0.6 mg/dL

## 2015-07-04 LAB — BASIC METABOLIC PANEL
BUN: 20 mg/dL (ref 4–21)
CREATININE: 1 mg/dL (ref <=1.1)
GLUCOSE: 111 mg/dL
POTASSIUM: 3.9 mmol/L (ref 3.4–5.3)
SODIUM: 138 mmol/L (ref 137–147)

## 2015-07-04 NOTE — Assessment & Plan Note (Signed)
11/28/14 TSH 0.694,  02/28/15 TSH 0.663, continue Synthroid 41mcg

## 2015-07-04 NOTE — Assessment & Plan Note (Signed)
Managed with Myrbetriq, average bathroom trips 2x/night. 

## 2015-07-04 NOTE — Assessment & Plan Note (Signed)
07/03/15 elevated T, no productive cough, c/o chills and nauseated, denied chest pain, SOB, or palpitation. 07/04/15 CBC, CMP, UA C/S, CXR

## 2015-07-04 NOTE — Assessment & Plan Note (Signed)
Memory lapses. Continue AL for care needs 

## 2015-07-04 NOTE — Assessment & Plan Note (Signed)
04/17/15 CXR no acute cardiopulmonary findings.  04/18/15 increased SOB and edema RLE>LLE, better, cotninue Furosemide 10mg  daily. Continue Advair, Singulair, Fluticasone, prn Albuterol.

## 2015-07-04 NOTE — Progress Notes (Signed)
Patient ID: Kimberly Greer, female   DOB: 18-Nov-1921, 82 y.o.   MRN: [id number]  Location:  [facility] Room Number: AL 11 Place of Service: AL [facility] Provider:  [name] NP  Kimberly Dooms, MD  Patient Care Team: Kimberly Dooms, MD as PCP - General (Internal Medicine) Kimberly Pigg, MD as Consulting Physician (Obstetrics and Gynecology) Kimberly Fam, MD as Consulting Physician (Ophthalmology) Kimberly Quitter, MD as Consulting Physician (Otolaryngology) Kimberly Broker, MD as Consulting Physician (Urology) [facility]  Extended Emergency Contact Information Primary Emergency Contact: Kimberly Greer,Kimberly Greer Address: [place], [place] Phone: [phone] Work Phone: [phone] Mobile Phone: [phone] Relation: Son Secondary Emergency Contact: Kimberly Greer [place] of [place] Phone: [phone] Work Phone: [phone] Relation: Friend  Code Status:  DNR Goals of care: Advanced Directive information Advanced Directives 07/04/2015  Does patient have an advance directive? Yes  Type of Paramedic of [place];Living will  Does patient want to make changes to advanced directive? No - Patient declined  Copy of advanced directive(s) in chart? Yes     Chief Complaint  Patient presents with  . Acute Visit    fever    HPI:  Pt is a 82 y.o. female seen today for medical management of chronic diseases.  Fever and chills, T101 07/03/15, SOB, no productive cough, not new, hx of asthma, but seems worse, chronic BLE edema R>L, no O2 desaturation or chest pain or audible wheezes, denied chest pain, dysuria, or diarrhea   Hx of depression, Mirtazapine 7.5mg  started about 4 days ago, started she sleeps well, no significant improvement in her appetite, but she seems brighter today, hypothyroidism, takes Levothyroxine 38mcg daily, last TSH 0.66 02/28/15, average bathroom trips  x2/night while on Myrbetriq 25mg  daily. GERD is asymptomatic while on Omeprazole.  Left shoulder is less pain, left fingers(left ring finger) are in splint, f/u Ortho, she needs assistance with ADLs due to the fxs of the left shoulder and 4th finger released from contracture.    Past Medical History  Diagnosis Date  . Asthma   . Hypothyroid   . Senile osteoporosis 06/16/2010  . Other abnormal blood chemistry 06/19/2010    hyperglycemia  . Pain in limb 12/05/2009  . Other and unspecified hyperlipidemia 10/24/2009  . Syncope and collapse 05/30/2009  . Carpal tunnel syndrome 03/07/2009  . Sensorineural hearing loss, unspecified 02/21/2009  . Unspecified nasal polyp 02/21/2009  . Disturbance of skin sensation 02/21/2009  . Unspecified urinary incontinence 02/21/2009  . Diverticulosis of colon (without mention of hemorrhage) 02/16/2009  . 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 Other atopic dermatitis and related conditions 02/16/2009  . Osteoarthrosis, unspecified whether generalized or localized, unspecified site 02/16/2009  . Otosclerosis, unspecified 02/22/2003  . Fracture of upper end of left humerus 11/18/14   Past Surgical History  Procedure Laterality Date  . Breast biopsy  07/16/1990    benign left breast needle biopsy  Dr. Margot Chimes  . Abdominal hysterectomy  1999    Dr. Collier Bullock  . Cataract extraction w/ intraocular lens  implant, bilateral Bilateral   . Carpal tunnel release  04/07/2009    right Dr. Daylene Katayama  . Trigger finger release Left 08/09/2014    Procedure: RELEASE TRIGGER FINGER/A-1 PULLEY LEFT MIDDLE FINGER;  Surgeon: Daryll Brod, MD;  Location: [facility];  Service: Orthopedics;  Laterality: Left;  REGIONAL/FAB  Allergies  Allergen Reactions  . Avelox [Moxifloxacin Hcl In Nacl] Itching  . Doxycycline   . Hista-Tabs [Triprolidine-Pse] Other (See Comments)    Passed out  . Septra [Sulfamethoxazole-Trimethoprim]   . Sulfa Antibiotics     Itching/rash (all over)        Medication List       This list is accurate as of: 07/04/15  5:25 PM.  Always use your most recent med list.               acetaminophen 325 MG tablet  Commonly known as:  TYLENOL  Take 2 tablets (650 mg total) by mouth every 6 (six) hours as needed for mild pain or moderate pain.     feeding supplement Liqd  Take 1 Container by mouth 3 (three) times daily between meals.     fluticasone 50 MCG/ACT nasal spray  Commonly known as:  FLONASE  Shake well before each use and instill one to two sprays in each nostril once daily to control congestion     Fluticasone-Salmeterol 250-50 MCG/DOSE Aepb  Commonly known as:  ADVAIR DISKUS  Inhale once daily to help breathing     furosemide 40 MG tablet  Commonly known as:  LASIX  One daily to help control edema     levothyroxine 50 MCG tablet  Commonly known as:  SYNTHROID, LEVOTHROID  Take one tablet by mouth once daily for thyroid     meloxicam 15 MG tablet  Commonly known as:  MOBIC  One daily to help arthritis     mirtazapine 7.5 MG tablet  Commonly known as:  REMERON  Take 7.5 mg by mouth at bedtime.     montelukast 10 MG tablet  Commonly known as:  SINGULAIR  Take one tablet by mouth once daily to help breathing     multivitamin with minerals tablet  Take 1 tablet by mouth daily. Take 1 tablet as a vitamin supplement.     MYRBETRIQ 25 MG Tb24 tablet  Generic drug:  mirabegron ER  TAKE ONE TABLET BY MOUTH ONCE DAILY     omeprazole 20 MG capsule  Commonly known as:  PRILOSEC  Take 20 mg by mouth daily. Take 1 tablet daily for heartburn or stomach reflux.     raloxifene 60 MG tablet  Commonly known as:  EVISTA  Take 1 tablet by mouth  daily to treat osteoporosis     VENTOLIN HFA 108 (90 Base) MCG/ACT inhaler  Generic drug:  albuterol  Inhale 2 puffs by mouth every 6 hours as needed for shortness of breath     VIACTIV 500-500-40 MG-UNT-MCG Chew  Generic drug:  Calcium-Vitamin D-Vitamin K  Chew 1 tablet by mouth  daily. Take 1 tablet daily for calcium.        Review of Systems  Constitutional: Positive for activity change, appetite change, fatigue and unexpected weight change. Negative for fever.  HENT: Positive for hearing loss. Negative for congestion and nosebleeds.   Respiratory: Positive for cough and shortness of breath. Negative for wheezing and stridor.   Cardiovascular: Positive for leg swelling. Negative for chest pain and palpitations.       Trace BLE  Endocrine: Negative for polydipsia.  Genitourinary: Negative for dysuria, frequency and flank pain.  Musculoskeletal: Negative for myalgias.       Scar right palm from release of 4th finger contracture Left shoulder fx 11/06/14, in sling, f/u Ortho, pain with movement  Skin: Negative.  Negative for rash.  Neurological: Negative for  dizziness, tremors, seizures, weakness and headaches.  Psychiatric/Behavioral: Positive for dysphoric mood.    Immunization History  Administered Date(s) Administered  . Influenza Whole 12/27/2011, 11/05/2012  . Influenza-Unspecified 11/18/2013, 11/03/2014  . PPD Test 04/04/2008, 11/23/2014  . Pneumococcal Polysaccharide-23 02/05/2008, 06/03/2008  . Td 02/05/2008, 06/03/2008   Pertinent  Health Maintenance Due  Topic Date Due  . DEXA SCAN  04/20/1988  . PNA vac Low Risk Adult (2 of 2 - PCV13) 06/03/2009  . INFLUENZA VACCINE  09/05/2015   Fall Risk  05/30/2015 05/16/2015 04/25/2015 01/02/2015 09/06/2014  Falls in the past year? Yes No No Yes Yes  Number falls in past yr: 1 - - 1 1  Injury with Fall? Yes - - Yes Yes  Risk Factor Category  - - - High Fall Risk -  Risk for fall due to : History of fall(s);Impaired balance/gait;Impaired mobility - - History of fall(s) -  Follow up Falls evaluation completed;Education provided - - Falls evaluation completed -   Functional Status Survey:    Filed Vitals:   07/04/15 1512  BP: 103/60  Pulse: 71  Temp: 97.1 F (36.2 C)  TempSrc: Oral  Resp: 18    Height: 4\' 11"  (1.499 m)  Weight: 134 lb (60.782 kg)  SpO2: 93%   Body mass index is 27.05 kg/(m^2). Physical Exam  Constitutional: She is oriented to person, place, and time. She appears well-developed and well-nourished. No distress.  HENT:  Head: Normocephalic and atraumatic.  Right Ear: External ear normal.  Left Ear: External ear normal.  Nose: Nose normal.  Mouth/Throat: Oropharynx is clear and moist.  Mild hearing deficit  Eyes:  Corrective lenses  Neck: No JVD present. No tracheal deviation present. No thyromegaly present.  Cardiovascular: Normal rate, regular rhythm, normal heart sounds and intact distal pulses.  Exam reveals no gallop and no friction rub.   No murmur heard. Pulmonary/Chest: No respiratory distress. She has no wheezes. She has rales. She exhibits no tenderness.  Abdominal: She exhibits no distension and no mass. There is no tenderness.  Musculoskeletal: She exhibits edema and tenderness.  Unstable gait. Using cane. Right 4th finger contracture has been released. Left shoulder fx 11/06/14, decreased ROM and pain with movement. 1+ edema seen BLE, R>L May ambulate with walker.     Lymphadenopathy:    She has no cervical adenopathy.  Neurological: She is alert and oriented to person, place, and time. No cranial nerve deficit. Coordination normal.  10/27/12 MMSE 28/30. Passed clock drawing.  Skin: No rash noted. No erythema. No pallor.  Psychiatric: She has a normal mood and affect. Her behavior is normal. Judgment and thought content normal.    Labs reviewed:  Recent Labs  02/28/15 04/20/15 04/24/15  NA 136* 138 142  K 4.1 5.5* 4.6  BUN 16 25* 24*  CREATININE 0.8 0.8 0.9    Recent Labs  01/05/15 02/28/15 04/20/15  AST 16 20 39*  ALT 17 25 18   ALKPHOS 63 56 58    Recent Labs  01/05/15 02/28/15 04/20/15  WBC 6.7 18.6 8.6  HGB 12.5 14.0 14.2  HCT 38 44 44  PLT 307 282 262   Lab Results  Component Value Date   TSH 0.66 02/28/2015   No  results found for: HGBA1C Lab Results  Component Value Date   CHOL 211* 02/14/2014   HDL 47 02/14/2014   LDLCALC 133 02/14/2014   TRIG 155 02/14/2014    Significant Diagnostic Results in last 30 days:  No results found.  Assessment/Plan  Intrinsic asthma 04/17/15 CXR no acute cardiopulmonary findings.  04/18/15 increased SOB and edema RLE>LLE, better, cotninue Furosemide 10mg  daily. Continue Advair, Singulair, Fluticasone, prn Albuterol.   GERD (gastroesophageal reflux disease) Stable, continue Omeprazole 20mg  daily.   Hypothyroid 11/28/14 TSH 0.694,  02/28/15 TSH 0.663, continue Synthroid 97mcg   Hypertonicity of bladder Managed with Myrbetriq, average bathroom trips 2x/night.  Memory loss Memory lapses. Continue AL for care needs  Edema 1+ dependent edema BLE, R>L, BNP 22.1 01/05/15. Continue Furosemide 10mg  po daily   Situational depression  04/18/15 seems better in her facial looks and conversation. Continue to observe.  Mirtazapine 7.5mg ,   Fever, unspecified 07/03/15 elevated T, no productive cough, c/o chills and nauseated, denied chest pain, SOB, or palpitation. 07/04/15 CBC, CMP, UA C/S, CXR      Family/ staff Communication: continue AL for care needs.   Labs/tests ordered: CBC, CMP, UA C/S, CXR

## 2015-07-04 NOTE — Assessment & Plan Note (Signed)
Stable, continue Omeprazole 20mg daily.  

## 2015-07-04 NOTE — Assessment & Plan Note (Signed)
04/18/15 seems better in her facial looks and conversation. Continue to observe.  Mirtazapine 7.5mg ,

## 2015-07-04 NOTE — Assessment & Plan Note (Signed)
1+ dependent edema BLE, R>L, BNP 22.1 01/05/15. Continue Furosemide 10mg  po daily

## 2015-08-07 DIAGNOSIS — E039 Hypothyroidism, unspecified: Secondary | ICD-10-CM | POA: Diagnosis not present

## 2015-08-07 LAB — TSH: TSH: 1.51 u[IU]/mL (ref <=5.90)

## 2015-09-05 ENCOUNTER — Encounter: Payer: Self-pay | Admitting: Nurse Practitioner

## 2015-09-05 ENCOUNTER — Non-Acute Institutional Stay: Payer: Medicare Other | Admitting: Nurse Practitioner

## 2015-09-05 DIAGNOSIS — N318 Other neuromuscular dysfunction of bladder: Secondary | ICD-10-CM

## 2015-09-05 DIAGNOSIS — J4520 Mild intermittent asthma, uncomplicated: Secondary | ICD-10-CM

## 2015-09-05 DIAGNOSIS — F4321 Adjustment disorder with depressed mood: Secondary | ICD-10-CM | POA: Diagnosis not present

## 2015-09-05 DIAGNOSIS — R609 Edema, unspecified: Secondary | ICD-10-CM | POA: Diagnosis not present

## 2015-09-05 DIAGNOSIS — R413 Other amnesia: Secondary | ICD-10-CM | POA: Diagnosis not present

## 2015-09-05 DIAGNOSIS — E039 Hypothyroidism, unspecified: Secondary | ICD-10-CM | POA: Diagnosis not present

## 2015-09-05 DIAGNOSIS — K219 Gastro-esophageal reflux disease without esophagitis: Secondary | ICD-10-CM

## 2015-09-05 NOTE — Assessment & Plan Note (Signed)
Managed with Myrbetriq, average bathroom trips 2x/night. 

## 2015-09-05 NOTE — Assessment & Plan Note (Signed)
08/07/15 TSH 1.51,  continue Synthroid 54mcg

## 2015-09-05 NOTE — Assessment & Plan Note (Signed)
seems better in her facial looks and conversation. Continue to observe.  Mirtazapine 7.5mg ,

## 2015-09-05 NOTE — Assessment & Plan Note (Signed)
Memory lapses. Continue AL for care needs 

## 2015-09-05 NOTE — Assessment & Plan Note (Signed)
Stable, continue Omeprazole 20mg daily.  

## 2015-09-05 NOTE — Assessment & Plan Note (Signed)
04/17/15 CXR no acute cardiopulmonary findings.  04/18/15 increased SOB and edema RLE>LLE, better, cotninue Furosemide 10mg  daily. Continue Advair, Singulair, Fluticasone, prn Albuterol. Baseline chronic cough

## 2015-09-05 NOTE — Progress Notes (Signed)
Location:   [facility] Room Number: AL 11 Place of Service:  ALF [phone]) Provider:  Miko Markwood  NP    Patient Care Team: [name], MD as PCP - General (Internal Medicine) Newton Pigg, MD as Consulting Physician (Obstetrics and Gynecology) Monna Fam, MD as Consulting Physician (Ophthalmology) Melida Quitter, MD as Consulting Physician (Otolaryngology) Myrlene Broker, MD as Consulting Physician (Urology) Edith Endave Annalis Kaczmarczyk Otho Darner, NP as Nurse Practitioner (Internal Medicine)  Extended Emergency Contact Information Primary Emergency Contact: Francisco,[name] Address: [place], [place] Phone: [phone] Work Phone: [phone] Mobile Phone: [phone] Relation: Son Secondary Emergency Contact: Teofilo Pod [place] of [place] Phone: [phone] Work Phone: [phone] Relation: Friend  Code Status:  Full Code Goals of care: Advanced Directive information Advanced Directives 09/05/2015  Does patient have an advance directive? Yes  Type of Paramedic of [place];Living will  Does patient want to make changes to advanced directive? No - Patient declined  Copy of advanced directive(s) in chart? Yes     Chief Complaint  Patient presents with  . Medical Management of Chronic Issues    HPI:  Pt is a 82 y.o. female seen today for medical management of chronic diseases.     Past Medical History:  Diagnosis Date  . Asthma   . Carpal tunnel syndrome 03/07/2009  . Disturbance of skin sensation 02/21/2009  . Diverticulosis of colon (without mention of hemorrhage) 02/16/2009  . Fracture of upper end of left humerus 11/18/14  . 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 Hypothyroid   . Osteoarthrosis, unspecified whether generalized or localized, unspecified site 02/16/2009  . Other abnormal blood chemistry 06/19/2010   hyperglycemia  . Other and unspecified  hyperlipidemia 10/24/2009  . Other atopic dermatitis and related conditions 02/16/2009  . Otosclerosis, unspecified 02/22/2003  . Pain in limb 12/05/2009  . Senile osteoporosis 06/16/2010  . Sensorineural hearing loss, unspecified 02/21/2009  . Syncope and collapse 05/30/2009  . Unspecified nasal polyp 02/21/2009  . Unspecified urinary incontinence 02/21/2009   Past Surgical History:  Procedure Laterality Date  . ABDOMINAL HYSTERECTOMY  1999   Dr. Collier Bullock  . BREAST BIOPSY  07/16/1990   benign left breast needle biopsy  Dr. Margot Chimes  . CARPAL TUNNEL RELEASE  04/07/2009   right Dr. Daylene Katayama  . CATARACT EXTRACTION W/ INTRAOCULAR LENS  IMPLANT, BILATERAL Bilateral   . TRIGGER FINGER RELEASE Left 08/09/2014   Procedure: RELEASE TRIGGER FINGER/A-1 PULLEY LEFT MIDDLE FINGER;  Surgeon: Daryll Brod, MD;  Location: [facility];  Service: Orthopedics;  Laterality: Left;  REGIONAL/FAB    Allergies  Allergen Reactions  . Avelox [Moxifloxacin Hcl In Nacl] Itching  . Doxycycline   . Hista-Tabs [Triprolidine-Pse] Other (See Comments)    Passed out  . Septra [Sulfamethoxazole-Trimethoprim]   . Sulfa Antibiotics     Itching/rash (all over)      Medication List       Accurate as of 09/05/15 11:59 PM. Always use your most recent med list.          acetaminophen 325 MG tablet Commonly known as:  TYLENOL Take 2 tablets (650 mg total) by mouth every 6 (six) hours as needed for mild pain or moderate pain.   feeding supplement Liqd Take 1 Container by mouth 3 (three) times daily between meals.   fluticasone 50 MCG/ACT nasal spray Commonly  known as:  FLONASE Shake well before each use and instill one to two sprays in each nostril once daily to control congestion   Fluticasone-Salmeterol 250-50 MCG/DOSE Aepb Commonly known as:  ADVAIR DISKUS Inhale once daily to help breathing   furosemide 40 MG tablet Commonly known as:  LASIX Take 40 mg by mouth every morning.   levothyroxine 25 MCG  tablet Commonly known as:  SYNTHROID, LEVOTHROID Take 25 mcg by mouth daily before breakfast.   lidocaine 4 % cream Commonly known as:  LMX Apply topically to bilateral  knees AC & HS as need for pain.   meloxicam 15 MG tablet Commonly known as:  MOBIC One daily to help arthritis   mirtazapine 7.5 MG tablet Commonly known as:  REMERON Take 7.5 mg by mouth at bedtime.   montelukast 10 MG tablet Commonly known as:  SINGULAIR Take one tablet by mouth once daily to help breathing   multivitamin with minerals tablet Take 1 tablet by mouth daily. Take 1 tablet as a vitamin supplement.   MYRBETRIQ 25 MG Tb24 tablet Generic drug:  mirabegron ER TAKE ONE TABLET BY MOUTH ONCE DAILY   omeprazole 20 MG capsule Commonly known as:  PRILOSEC Take 20 mg by mouth daily. Take 1 tablet daily for heartburn or stomach reflux.   raloxifene 60 MG tablet Commonly known as:  EVISTA Take 1 tablet by mouth  daily to treat osteoporosis   VENTOLIN HFA 108 (90 Base) MCG/ACT inhaler Generic drug:  albuterol Inhale 2 puffs by mouth every 6 hours as needed for shortness of breath   VIACTIV 500-500-40 MG-UNT-MCG Chew Generic drug:  Calcium-Vitamin D-Vitamin K Chew 1 tablet by mouth daily. Take 1 tablet daily for calcium.       Review of Systems  Constitutional: Negative for activity change, appetite change, fatigue, fever and unexpected weight change.  HENT: Positive for hearing loss. Negative for congestion and nosebleeds.   Respiratory: Positive for shortness of breath. Negative for cough, wheezing and stridor.   Cardiovascular: Positive for leg swelling. Negative for chest pain and palpitations.       Trace BLE  Endocrine: Negative for polydipsia.  Genitourinary: Negative for dysuria, flank pain and frequency.  Musculoskeletal: Negative for myalgias.       Scar right palm from release of 4th finger contracture Left shoulder fx 11/06/14, in sling, f/u Ortho, pain with movement  Skin:  Negative.  Negative for rash.  Neurological: Negative for dizziness, tremors, seizures, weakness and headaches.  Psychiatric/Behavioral: Positive for dysphoric mood.    Immunization History  Administered Date(s) Administered  . Influenza Whole 12/27/2011, 11/05/2012  . Influenza-Unspecified 11/18/2013, 11/03/2014  . PPD Test 04/04/2008, 11/23/2014  . Pneumococcal Polysaccharide-23 02/05/2008, 06/03/2008  . Td 02/05/2008, 06/03/2008   Pertinent  Health Maintenance Due  Topic Date Due  . DEXA SCAN  04/20/1988  . PNA vac Low Risk Adult (2 of 2 - PCV13) 06/03/2009  . INFLUENZA VACCINE  09/05/2015   Fall Risk  05/30/2015 05/16/2015 04/25/2015 01/02/2015 09/06/2014  Falls in the past year? Yes No No Yes Yes  Number falls in past yr: 1 - - 1 1  Injury with Fall? Yes - - Yes Yes  Risk Factor Category  - - - High Fall Risk -  Risk for fall due to : History of fall(s);Impaired balance/gait;Impaired mobility - - History of fall(s) -  Follow up Falls evaluation completed;Education provided - - Falls evaluation completed -   Functional Status Survey:    Vitals:  09/05/15 1123  BP: 129/60  Pulse: 98  Resp: 19  Temp: 97.7 F (36.5 C)  Weight: 134 lb (60.8 kg)  Height: 4\' 11"  (1.499 m)   Body mass index is 27.06 kg/m. Physical Exam  Constitutional: She is oriented to person, place, and time. She appears well-developed and well-nourished. No distress.  HENT:  Head: Normocephalic and atraumatic.  Right Ear: External ear normal.  Left Ear: External ear normal.  Nose: Nose normal.  Mouth/Throat: Oropharynx is clear and moist.  Mild hearing deficit  Eyes:  Corrective lenses  Neck: No JVD present. No tracheal deviation present. No thyromegaly present.  Cardiovascular: Normal rate, regular rhythm, normal heart sounds and intact distal pulses.  Exam reveals no gallop and no friction rub.   No murmur heard. Pulmonary/Chest: No respiratory distress. She has no wheezes. She has rales. She  exhibits no tenderness.  Abdominal: She exhibits no distension and no mass. There is no tenderness.  Musculoskeletal: She exhibits edema and tenderness.  Unstable gait. Using cane. Right 4th finger contracture has been released. Left shoulder fx 11/06/14, decreased ROM and pain with movement. Chronic trace edema BLE, R>L Continue to ambulate with walker.     Lymphadenopathy:    She has no cervical adenopathy.  Neurological: She is alert and oriented to person, place, and time. No cranial nerve deficit. Coordination normal.  10/27/12 MMSE 28/30. Passed clock drawing.  Skin: No rash noted. No erythema. No pallor.  Psychiatric: She has a normal mood and affect. Her behavior is normal. Judgment and thought content normal.    Labs reviewed:  Recent Labs  04/20/15 04/24/15 07/04/15  NA 138 142 138  K 5.5* 4.6 3.9  BUN 25* 24* 20  CREATININE 0.8 0.9 1.0    Recent Labs  02/28/15 04/20/15 07/04/15  AST 20 39* 18  ALT 25 18 12   ALKPHOS 56 58 62    Recent Labs  02/28/15 04/20/15 07/04/15  WBC 18.6 8.6 14.7  HGB 14.0 14.2 12.9  HCT 44 44 39  PLT 282 262 272   Lab Results  Component Value Date   TSH 1.51 08/07/2015   No results found for: HGBA1C Lab Results  Component Value Date   CHOL 211 (A) 02/14/2014   HDL 47 02/14/2014   LDLCALC 133 02/14/2014   TRIG 155 02/14/2014    Significant Diagnostic Results in last 30 days:  No results found.  Assessment/Plan There are no diagnoses linked to this encounter.Intrinsic asthma 04/17/15 CXR no acute cardiopulmonary findings.  04/18/15 increased SOB and edema RLE>LLE, better, cotninue Furosemide 10mg  daily. Continue Advair, Singulair, Fluticasone, prn Albuterol. Baseline chronic cough    GERD (gastroesophageal reflux disease) Stable, continue Omeprazole 20mg  daily.   Hypothyroid 08/07/15 TSH 1.51,  continue Synthroid 70mcg   Hypertonicity of bladder Managed with Myrbetriq, average bathroom trips 2x/night.  Memory  loss Memory lapses. Continue AL for care needs  Edema Chronic trace dependent edema BLE, R>L, BNP 22.1 01/05/15. Continue Furosemide 40mg  po daily    Situational depression seems better in her facial looks and conversation. Continue to observe.  Mirtazapine 7.5mg ,       Family/ staff Communication: continue AL for care assistance  Labs/tests ordered:  none

## 2015-09-05 NOTE — Assessment & Plan Note (Addendum)
Chronic trace dependent edema BLE, R>L, BNP 22.1 01/05/15. Continue Furosemide 40mg po daily 

## 2015-09-25 DIAGNOSIS — Z79899 Other long term (current) drug therapy: Secondary | ICD-10-CM | POA: Diagnosis not present

## 2015-10-05 ENCOUNTER — Encounter: Payer: Self-pay | Admitting: Nurse Practitioner

## 2015-10-05 ENCOUNTER — Non-Acute Institutional Stay: Payer: Medicare Other | Admitting: Nurse Practitioner

## 2015-10-05 DIAGNOSIS — F4321 Adjustment disorder with depressed mood: Secondary | ICD-10-CM

## 2015-10-05 DIAGNOSIS — R413 Other amnesia: Secondary | ICD-10-CM | POA: Diagnosis not present

## 2015-10-05 DIAGNOSIS — E039 Hypothyroidism, unspecified: Secondary | ICD-10-CM

## 2015-10-05 DIAGNOSIS — K219 Gastro-esophageal reflux disease without esophagitis: Secondary | ICD-10-CM

## 2015-10-05 DIAGNOSIS — N318 Other neuromuscular dysfunction of bladder: Secondary | ICD-10-CM

## 2015-10-05 DIAGNOSIS — R600 Localized edema: Secondary | ICD-10-CM | POA: Diagnosis not present

## 2015-10-05 NOTE — Progress Notes (Signed)
Location:   [facility] Room Number: 11 Place of Service:  ALF [phone]) Provider:  Mast, Manxie  NP  Jeanmarie Hubert, MD  Patient Care Team: [name], MD as PCP - General (Internal Medicine) Newton Pigg, MD as Consulting Physician (Obstetrics and Gynecology) Monna Fam, MD as Consulting Physician (Ophthalmology) Melida Quitter, MD as Consulting Physician (Otolaryngology) Myrlene Broker, MD as Consulting Physician (Urology) [place] Otho Darner, NP as Nurse Practitioner (Internal Medicine)  Extended Emergency Contact Information Primary Emergency Contact: Teffeteller,[name] Address: [place], [place] Phone: [phone] Work Phone: [phone] Mobile Phone: [phone] Relation: Son Secondary Emergency Contact: Teofilo Pod [place] of [place] Phone: [phone] Work Phone: [phone] Relation: Friend  Code Status:  Full Code Goals of care: Advanced Directive information Advanced Directives 10/05/2015  Does patient have an advance directive? Yes  Type of Paramedic of [place];Living will  Does patient want to make changes to advanced directive? No - Patient declined  Copy of advanced directive(s) in chart? Yes     Chief Complaint  Patient presents with  . Medical Management of Chronic Issues    HPI:  Pt is a 82 y.o. female seen today for medical management of chronic diseases.      Hx of depression, Mirtazapine 7.5mg  improved, smile, sleeps, eats better,  hypothyroidism, takes Levothyroxine 30mcg daily, last TSH 1.51 08/07/15,  average bathroom trips x2/night while on Myrbetriq 25mg  daily. GERD is asymptomatic while on Omeprazole.  Left shoulder, healed fx,  left fingers(left ring finger) fxs of the left shoulder and 4th finger released from contracture. BLE edema, trace, mainly in the left ankle, taking Furosemide 40mg  daily  Past Medical  History:  Diagnosis Date  . Asthma   . Carpal tunnel syndrome 03/07/2009  . Disturbance of skin sensation 02/21/2009  . Diverticulosis of colon (without mention of hemorrhage) 02/16/2009  . Fracture of upper end of left humerus 11/18/14  . 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 Hypothyroid   . Osteoarthrosis, unspecified whether generalized or localized, unspecified site 02/16/2009  . Other abnormal blood chemistry 06/19/2010   hyperglycemia  . Other and unspecified hyperlipidemia 10/24/2009  . Other atopic dermatitis and related conditions 02/16/2009  . Otosclerosis, unspecified 02/22/2003  . Pain in limb 12/05/2009  . Senile osteoporosis 06/16/2010  . Sensorineural hearing loss, unspecified 02/21/2009  . Syncope and collapse 05/30/2009  . Unspecified nasal polyp 02/21/2009  . Unspecified urinary incontinence 02/21/2009   Past Surgical History:  Procedure Laterality Date  . ABDOMINAL HYSTERECTOMY  1999   Dr. Collier Bullock  . BREAST BIOPSY  07/16/1990   benign left breast needle biopsy  Dr. Margot Chimes  . CARPAL TUNNEL RELEASE  04/07/2009   right Dr. Daylene Katayama  . CATARACT EXTRACTION W/ INTRAOCULAR LENS  IMPLANT, BILATERAL Bilateral   . TRIGGER FINGER RELEASE Left 08/09/2014   Procedure: RELEASE TRIGGER FINGER/A-1 PULLEY LEFT MIDDLE FINGER;  Surgeon: Daryll Brod, MD;  Location: [facility];  Service: Orthopedics;  Laterality: Left;  REGIONAL/FAB    Allergies  Allergen Reactions  . Avelox [Moxifloxacin Hcl In Nacl] Itching  . Doxycycline   . Hista-Tabs [Triprolidine-Pse] Other (See Comments)    Passed out  . Septra [Sulfamethoxazole-Trimethoprim]   . Sulfa Antibiotics     Itching/rash (all over)      Medication List       Accurate as of  10/05/15  4:22 PM. Always use your most recent med list.          acetaminophen 325 MG tablet Commonly known as:  TYLENOL Take 2 tablets (650 mg total) by mouth every 6 (six) hours as needed for mild pain or moderate pain.   feeding supplement  Liqd Take 1 Container by mouth 3 (three) times daily between meals.   fluticasone 50 MCG/ACT nasal spray Commonly known as:  FLONASE Shake well before each use and instill one to two sprays in each nostril once daily to control congestion   Fluticasone-Salmeterol 250-50 MCG/DOSE Aepb Commonly known as:  ADVAIR DISKUS Inhale once daily to help breathing   furosemide 40 MG tablet Commonly known as:  LASIX Take 40 mg by mouth every morning.   levothyroxine 25 MCG tablet Commonly known as:  SYNTHROID, LEVOTHROID Take 25 mcg by mouth daily before breakfast.   lidocaine 4 % cream Commonly known as:  LMX Apply topically to bilateral  knees AC & HS as need for pain.   meloxicam 15 MG tablet Commonly known as:  MOBIC One daily to help arthritis   mirtazapine 7.5 MG tablet Commonly known as:  REMERON Take 7.5 mg by mouth at bedtime.   montelukast 10 MG tablet Commonly known as:  SINGULAIR Take one tablet by mouth once daily to help breathing   multivitamin with minerals tablet Take 1 tablet by mouth daily. Take 1 tablet as a vitamin supplement.   MYRBETRIQ 25 MG Tb24 tablet Generic drug:  mirabegron ER TAKE ONE TABLET BY MOUTH ONCE DAILY   omeprazole 20 MG capsule Commonly known as:  PRILOSEC Take 20 mg by mouth daily. Take 1 tablet daily for heartburn or stomach reflux.   raloxifene 60 MG tablet Commonly known as:  EVISTA Take 1 tablet by mouth  daily to treat osteoporosis   VENTOLIN HFA 108 (90 Base) MCG/ACT inhaler Generic drug:  albuterol Inhale 2 puffs by mouth every 6 hours as needed for shortness of breath   VIACTIV 500-500-40 MG-UNT-MCG Chew Generic drug:  Calcium-Vitamin D-Vitamin K Chew 1 tablet by mouth daily. Take 1 tablet daily for calcium.       Review of Systems  Constitutional: Negative for activity change, appetite change, fatigue, fever and unexpected weight change.  HENT: Positive for hearing loss. Negative for congestion and nosebleeds.    Respiratory: Positive for shortness of breath. Negative for cough, wheezing and stridor.   Cardiovascular: Positive for leg swelling. Negative for chest pain and palpitations.       Trace BLE  Endocrine: Negative for polydipsia.  Genitourinary: Negative for dysuria, flank pain and frequency.  Musculoskeletal: Negative for myalgias.       Scar right palm from release of 4th finger contracture Left shoulder fx 11/06/14, in sling, f/u Ortho, pain with movement  Skin: Negative.  Negative for rash.  Neurological: Negative for dizziness, tremors, seizures, weakness and headaches.  Psychiatric/Behavioral: Positive for dysphoric mood.    Immunization History  Administered Date(s) Administered  . Influenza Whole 12/27/2011, 11/05/2012  . Influenza-Unspecified 11/18/2013, 11/03/2014  . PPD Test 04/04/2008, 11/23/2014  . Pneumococcal Polysaccharide-23 02/05/2008, 06/03/2008  . Td 02/05/2008, 06/03/2008   Pertinent  Health Maintenance Due  Topic Date Due  . DEXA SCAN  04/20/1988  . PNA vac Low Risk Adult (2 of 2 - PCV13) 06/03/2009  . INFLUENZA VACCINE  09/05/2015   Fall Risk  05/30/2015 05/16/2015 04/25/2015 01/02/2015 09/06/2014  Falls in the past year? Yes No No Yes Yes  Number falls in past yr: 1 - - 1 1  Injury with Fall? Yes - - Yes Yes  Risk Factor Category  - - - High Fall Risk -  Risk for fall due to : History of fall(s);Impaired balance/gait;Impaired mobility - - History of fall(s) -  Follow up Falls evaluation completed;Education provided - - Falls evaluation completed -   Functional Status Survey:    Vitals:   10/05/15 1423  BP: (!) 105/50  Pulse: 60  Resp: 18  Temp: 98.5 F (36.9 C)  Weight: 130 lb (59 kg)  Height: 4\' 11"  (1.499 m)   Body mass index is 26.26 kg/m. Physical Exam  Constitutional: She is oriented to person, place, and time. She appears well-developed and well-nourished. No distress.  HENT:  Head: Normocephalic and atraumatic.  Right Ear: External ear  normal.  Left Ear: External ear normal.  Nose: Nose normal.  Mouth/Throat: Oropharynx is clear and moist.  Mild hearing deficit  Eyes:  Corrective lenses  Neck: No JVD present. No tracheal deviation present. No thyromegaly present.  Cardiovascular: Normal rate, regular rhythm, normal heart sounds and intact distal pulses.  Exam reveals no gallop and no friction rub.   No murmur heard. Pulmonary/Chest: No respiratory distress. She has no wheezes. She has rales. She exhibits no tenderness.  Abdominal: She exhibits no distension and no mass. There is no tenderness.  Musculoskeletal: She exhibits edema and tenderness.  Unstable gait. Using cane. Right 4th finger contracture has been released. Left shoulder fx 11/06/14, decreased ROM and pain with movement. Chronic trace edema BLE, R>L Continue to ambulate with walker.     Lymphadenopathy:    She has no cervical adenopathy.  Neurological: She is alert and oriented to person, place, and time. No cranial nerve deficit. Coordination normal.  10/27/12 MMSE 28/30. Passed clock drawing.  Skin: No rash noted. No erythema. No pallor.  Psychiatric: She has a normal mood and affect. Her behavior is normal. Judgment and thought content normal.    Labs reviewed:  Recent Labs  04/20/15 04/24/15 07/04/15  NA 138 142 138  K 5.5* 4.6 3.9  BUN 25* 24* 20  CREATININE 0.8 0.9 1.0    Recent Labs  02/28/15 04/20/15 07/04/15  AST 20 39* 18  ALT 25 18 12   ALKPHOS 56 58 62    Recent Labs  02/28/15 04/20/15 07/04/15  WBC 18.6 8.6 14.7  HGB 14.0 14.2 12.9  HCT 44 44 39  PLT 282 262 272   Lab Results  Component Value Date   TSH 1.51 08/07/2015   No results found for: HGBA1C Lab Results  Component Value Date   CHOL 211 (A) 02/14/2014   HDL 47 02/14/2014   LDLCALC 133 02/14/2014   TRIG 155 02/14/2014    Significant Diagnostic Results in last 30 days:  No results found.  Assessment/Plan There are no diagnoses linked to this  encounter.GERD (gastroesophageal reflux disease) Stable, continue Omeprazole 20mg  daily.    Hypothyroid 08/07/15 TSH 1.51,  continue Synthroid 44mcg    Hypertonicity of bladder Managed with Myrbetriq, average bathroom trips 2x/night.   Memory loss Memory lapses. Continue AL for care needs   Edema Chronic trace dependent edema BLE, R>L, BNP 22.1 01/05/15. Continue Furosemide 40mg  po daily, 07/04/15 Na 138, K 3.9, Bun 20, creat 1.0     Situational depression seems better in her facial looks and conversation. Continue to observe.  Mirtazapine 7.5mg ,       Family/ staff Communication: continue AL for care  assistance  Labs/tests ordered:  none

## 2015-10-05 NOTE — Assessment & Plan Note (Signed)
Memory lapses. Continue AL for care needs 

## 2015-10-05 NOTE — Assessment & Plan Note (Signed)
Managed with Myrbetriq, average bathroom trips 2x/night. 

## 2015-10-05 NOTE — Assessment & Plan Note (Signed)
08/07/15 TSH 1.51,  continue Synthroid 32mcg

## 2015-10-05 NOTE — Assessment & Plan Note (Signed)
Stable, continue Omeprazole 20mg daily.  

## 2015-10-05 NOTE — Assessment & Plan Note (Signed)
seems better in her facial looks and conversation. Continue to observe.  Mirtazapine 7.5mg ,

## 2015-10-05 NOTE — Assessment & Plan Note (Addendum)
Chronic trace dependent edema BLE, R>L, BNP 22.1 01/05/15. Continue Furosemide 40mg  po daily, 07/04/15 Na 138, K 3.9, Bun 20, creat 1.0

## 2015-11-07 ENCOUNTER — Non-Acute Institutional Stay: Payer: Medicare Other | Admitting: Nurse Practitioner

## 2015-11-07 ENCOUNTER — Encounter: Payer: Self-pay | Admitting: Nurse Practitioner

## 2015-11-07 DIAGNOSIS — K219 Gastro-esophageal reflux disease without esophagitis: Secondary | ICD-10-CM

## 2015-11-07 DIAGNOSIS — M150 Primary generalized (osteo)arthritis: Secondary | ICD-10-CM

## 2015-11-07 DIAGNOSIS — N318 Other neuromuscular dysfunction of bladder: Secondary | ICD-10-CM | POA: Diagnosis not present

## 2015-11-07 DIAGNOSIS — E890 Postprocedural hypothyroidism: Secondary | ICD-10-CM

## 2015-11-07 DIAGNOSIS — F4321 Adjustment disorder with depressed mood: Secondary | ICD-10-CM

## 2015-11-07 DIAGNOSIS — M159 Polyosteoarthritis, unspecified: Secondary | ICD-10-CM

## 2015-11-07 DIAGNOSIS — R413 Other amnesia: Secondary | ICD-10-CM | POA: Diagnosis not present

## 2015-11-07 DIAGNOSIS — R609 Edema, unspecified: Secondary | ICD-10-CM | POA: Diagnosis not present

## 2015-11-07 NOTE — Assessment & Plan Note (Signed)
Memory lapses. Continue AL for care needs 

## 2015-11-07 NOTE — Assessment & Plan Note (Signed)
Stable, continue Omeprazole 20mg daily.  

## 2015-11-07 NOTE — Progress Notes (Signed)
Location:  [facility] Room Number: 11 Place of Service:  ALF [phone]) Provider:  Sherece Gambrill, Manxie  NP  Jeanmarie Hubert, MD  Patient Care Team: [name], MD as PCP - General (Internal Medicine) Newton Pigg, MD as Consulting Physician (Obstetrics and Gynecology) Monna Fam, MD as Consulting Physician (Ophthalmology) Melida Quitter, MD as Consulting Physician (Otolaryngology) Myrlene Broker, MD as Consulting Physician (Urology) [place] Naim Murtha Otho Darner, NP as Nurse Practitioner (Internal Medicine)  Extended Emergency Contact Information Primary Emergency Contact: Mccorkle,[name] Address: [place], [place] of [place] Phone: [phone] Work Phone: [phone] Mobile Phone: [phone] Relation: Son Secondary Emergency Contact: Teofilo Pod [place] of [place] Phone: [phone] Work Phone: [phone] Relation: Friend  Code Status:  Full Code Goals of care: Advanced Directive information Advanced Directives 11/07/2015  Does patient have an advance directive? Yes  Type of Paramedic of [place];Living will  Does patient want to make changes to advanced directive? No - Patient declined  Copy of advanced directive(s) in chart? Yes     Chief Complaint  Patient presents with  . Medical Management of Chronic Issues    HPI:  Pt is a 82 y.o. female seen today for medical management of chronic diseases.      Hx of depression, Mirtazapine 7.5mg  improved, smile, sleeps, eats better,  hypothyroidism, takes Levothyroxine 39mcg daily, last TSH 1.51 08/07/15,  average bathroom trips x2/night while on Myrbetriq 25mg  daily. GERD is asymptomatic while on Omeprazole. Left shoulder, healed fx,  left fingers(left ring finger) fxs of the left shoulder and 4th finger released from contracture. BLE edema, trace, mainly in the left ankle, taking Furosemide 40mg  daily  Past Medical  History:  Diagnosis Date  . Asthma   . Carpal tunnel syndrome 03/07/2009  . Disturbance of skin sensation 02/21/2009  . Diverticulosis of colon (without mention of hemorrhage) 02/16/2009  . Fracture of upper end of left humerus 11/18/14  . 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 Hypothyroid   . Osteoarthrosis, unspecified whether generalized or localized, unspecified site 02/16/2009  . Other abnormal blood chemistry 06/19/2010   hyperglycemia  . Other and unspecified hyperlipidemia 10/24/2009  . Other atopic dermatitis and related conditions 02/16/2009  . Otosclerosis, unspecified 02/22/2003  . Pain in limb 12/05/2009  . Senile osteoporosis 06/16/2010  . Sensorineural hearing loss, unspecified 02/21/2009  . Syncope and collapse 05/30/2009  . Unspecified nasal polyp 02/21/2009  . Unspecified urinary incontinence 02/21/2009   Past Surgical History:  Procedure Laterality Date  . ABDOMINAL HYSTERECTOMY  1999   Dr. Collier Bullock  . BREAST BIOPSY  07/16/1990   benign left breast needle biopsy  Dr. Margot Chimes  . CARPAL TUNNEL RELEASE  04/07/2009   right Dr. Daylene Katayama  . CATARACT EXTRACTION W/ INTRAOCULAR LENS  IMPLANT, BILATERAL Bilateral   . TRIGGER FINGER RELEASE Left 08/09/2014   Procedure: RELEASE TRIGGER FINGER/A-1 PULLEY LEFT MIDDLE FINGER;  Surgeon: Daryll Brod, MD;  Location: [facility];  Service: Orthopedics;  Laterality: Left;  REGIONAL/FAB    Allergies  Allergen Reactions  . Avelox [Moxifloxacin Hcl In Nacl] Itching  . Doxycycline   . Hista-Tabs [Triprolidine-Pse] Other (See Comments)    Passed out  . Septra [Sulfamethoxazole-Trimethoprim]   . Sulfa Antibiotics     Itching/rash (all over)      Medication List       Accurate as of 11/07/15  4:14 PM. Always use your most recent med list.          acetaminophen 325 MG tablet Commonly known as:  TYLENOL Take 2 tablets (650 mg total) by mouth every 6 (six) hours as needed for mild pain or moderate pain.   feeding supplement  Liqd Take 1 Container by mouth 3 (three) times daily between meals.   Fluticasone-Salmeterol 250-50 MCG/DOSE Aepb Commonly known as:  ADVAIR DISKUS Inhale once daily to help breathing   furosemide 40 MG tablet Commonly known as:  LASIX Take 40 mg by mouth every morning.   levothyroxine 25 MCG tablet Commonly known as:  SYNTHROID, LEVOTHROID Take 25 mcg by mouth daily before breakfast.   lidocaine 4 % cream Commonly known as:  LMX Apply topically to bilateral  knees AC & HS as need for pain.   meloxicam 7.5 MG tablet Commonly known as:  MOBIC Take 7.5 mg by mouth daily.   mirtazapine 7.5 MG tablet Commonly known as:  REMERON Take 7.5 mg by mouth at bedtime.   montelukast 10 MG tablet Commonly known as:  SINGULAIR Take one tablet by mouth once daily to help breathing   multivitamin with minerals tablet Take 1 tablet by mouth daily. Take 1 tablet as a vitamin supplement.   MYRBETRIQ 25 MG Tb24 tablet Generic drug:  mirabegron ER TAKE ONE TABLET BY MOUTH ONCE DAILY   omeprazole 20 MG capsule Commonly known as:  PRILOSEC Take 20 mg by mouth daily. Take 1 tablet daily for heartburn or stomach reflux.   raloxifene 60 MG tablet Commonly known as:  EVISTA Take 1 tablet by mouth  daily to treat osteoporosis   VENTOLIN HFA 108 (90 Base) MCG/ACT inhaler Generic drug:  albuterol Inhale 2 puffs by mouth every 6 hours as needed for shortness of breath       Review of Systems  Constitutional: Negative for activity change, appetite change, fatigue, fever and unexpected weight change.  HENT: Positive for hearing loss. Negative for congestion and nosebleeds.   Respiratory: Positive for shortness of breath. Negative for cough, wheezing and stridor.   Cardiovascular: Positive for leg swelling. Negative for chest pain and palpitations.       Trace BLE  Endocrine: Negative for polydipsia.  Genitourinary: Negative for dysuria, flank pain and frequency.  Musculoskeletal: Negative  for myalgias.       Scar right palm from release of 4th finger contracture Left shoulder fx 11/06/14, in sling, f/u Ortho, pain with movement  Skin: Negative.  Negative for rash.  Neurological: Negative for dizziness, tremors, seizures, weakness and headaches.  Psychiatric/Behavioral: Positive for dysphoric mood.    Immunization History  Administered Date(s) Administered  . Influenza Whole 12/27/2011, 11/05/2012  . Influenza-Unspecified 11/18/2013, 11/03/2014  . PPD Test 04/04/2008, 11/23/2014  . Pneumococcal Polysaccharide-23 02/05/2008, 06/03/2008  . Td 02/05/2008, 06/03/2008   Pertinent  Health Maintenance Due  Topic Date Due  . DEXA SCAN  04/20/1988  . PNA vac Low Risk Adult (2 of 2 - PCV13) 06/03/2009  . INFLUENZA VACCINE  09/05/2015   Fall Risk  05/30/2015 05/16/2015 04/25/2015 01/02/2015 09/06/2014  Falls in the past year? Yes No No Yes Yes  Number falls in past yr: 1 - - 1 1  Injury with Fall? Yes - - Yes Yes  Risk Factor Category  - - - High Fall Risk -  Risk for fall due to : History of fall(s);Impaired balance/gait;Impaired mobility - - History of fall(s) -  Follow up Falls evaluation completed;Education  provided - - Falls evaluation completed -   Functional Status Survey:    Vitals:   11/07/15 1447  BP: 127/87  Pulse: 78  Resp: 20  Temp: 97.7 F (36.5 C)  Weight: 131 lb 8 oz (59.6 kg)  Height: 4\' 11"  (1.499 m)   Body mass index is 26.56 kg/m. Physical Exam  Constitutional: She is oriented to person, place, and time. She appears well-developed and well-nourished. No distress.  HENT:  Head: Normocephalic and atraumatic.  Right Ear: External ear normal.  Left Ear: External ear normal.  Nose: Nose normal.  Mouth/Throat: Oropharynx is clear and moist.  Mild hearing deficit  Eyes:  Corrective lenses  Neck: No JVD present. No tracheal deviation present. No thyromegaly present.  Cardiovascular: Normal rate, regular rhythm, normal heart sounds and intact distal  pulses.  Exam reveals no gallop and no friction rub.   No murmur heard. Pulmonary/Chest: No respiratory distress. She has no wheezes. She has rales. She exhibits no tenderness.  Abdominal: She exhibits no distension and no mass. There is no tenderness.  Musculoskeletal: She exhibits edema and tenderness.  Unstable gait. Using cane. Right 4th finger contracture has been released. Left shoulder fx 11/06/14, decreased ROM and pain with movement. Chronic trace edema BLE, R>L Continue to ambulate with walker.  The left 3rd finger extension contracture.     Lymphadenopathy:    She has no cervical adenopathy.  Neurological: She is alert and oriented to person, place, and time. No cranial nerve deficit. Coordination normal.  10/27/12 MMSE 28/30. Passed clock drawing.  Skin: No rash noted. No erythema. No pallor.  Psychiatric: She has a normal mood and affect. Her behavior is normal. Judgment and thought content normal.    Labs reviewed:  Recent Labs  04/20/15 04/24/15 07/04/15  NA 138 142 138  K 5.5* 4.6 3.9  BUN 25* 24* 20  CREATININE 0.8 0.9 1.0    Recent Labs  02/28/15 04/20/15 07/04/15  AST 20 39* 18  ALT 25 18 12   ALKPHOS 56 58 62    Recent Labs  02/28/15 04/20/15 07/04/15  WBC 18.6 8.6 14.7  HGB 14.0 14.2 12.9  HCT 44 44 39  PLT 282 262 272   Lab Results  Component Value Date   TSH 1.51 08/07/2015   No results found for: HGBA1C Lab Results  Component Value Date   CHOL 211 (A) 02/14/2014   HDL 47 02/14/2014   LDLCALC 133 02/14/2014   TRIG 155 02/14/2014    Significant Diagnostic Results in last 30 days:  No results found.  Assessment/Plan GERD (gastroesophageal reflux disease) Stable, continue Omeprazole 20mg  daily.     Hypothyroid 08/07/15 TSH 1.51,  continue Synthroid 32mcg   Osteoarthritis Multiple sites, the left 3rd finger extension contracture, continue Meloxicam 7.5mg  since 09/21/15  Hypertonicity of bladder Managed with Myrbetriq, average  bathroom trips 2x/night.    Memory loss Memory lapses. Continue AL for care needs   Edema Chronic trace dependent edema BLE, R>L, BNP 22.1 01/05/15. Continue Furosemide 40mg  po daily, 07/04/15 Na 138, K 3.9, Bun 20, creat 1.0    Situational depression seems better in her facial looks and conversation. Continue to observe.  Mirtazapine 7.5mg ,      Family/ staff Communication: continue AL for care assistance.   Labs/tests ordered:  none

## 2015-11-07 NOTE — Assessment & Plan Note (Signed)
seems better in her facial looks and conversation. Continue to observe.  Mirtazapine 7.5mg ,

## 2015-11-07 NOTE — Assessment & Plan Note (Signed)
Chronic trace dependent edema BLE, R>L, BNP 22.1 01/05/15. Continue Furosemide 40mg  po daily, 07/04/15 Na 138, K 3.9, Bun 20, creat 1.0

## 2015-11-07 NOTE — Assessment & Plan Note (Signed)
Managed with Myrbetriq, average bathroom trips 2x/night. 

## 2015-11-07 NOTE — Assessment & Plan Note (Signed)
Multiple sites, the left 3rd finger extension contracture, continue Meloxicam 7.5mg  since 09/21/15

## 2015-11-07 NOTE — Assessment & Plan Note (Signed)
08/07/15 TSH 1.51,  continue Synthroid 55mcg

## 2015-12-05 ENCOUNTER — Encounter: Payer: Self-pay | Admitting: Nurse Practitioner

## 2015-12-05 ENCOUNTER — Non-Acute Institutional Stay: Payer: Medicare Other | Admitting: Nurse Practitioner

## 2015-12-05 DIAGNOSIS — R413 Other amnesia: Secondary | ICD-10-CM

## 2015-12-05 DIAGNOSIS — R609 Edema, unspecified: Secondary | ICD-10-CM | POA: Diagnosis not present

## 2015-12-05 DIAGNOSIS — S0083XA Contusion of other part of head, initial encounter: Secondary | ICD-10-CM

## 2015-12-05 DIAGNOSIS — N318 Other neuromuscular dysfunction of bladder: Secondary | ICD-10-CM

## 2015-12-05 DIAGNOSIS — K219 Gastro-esophageal reflux disease without esophagitis: Secondary | ICD-10-CM

## 2015-12-05 DIAGNOSIS — R269 Unspecified abnormalities of gait and mobility: Secondary | ICD-10-CM | POA: Diagnosis not present

## 2015-12-05 DIAGNOSIS — E039 Hypothyroidism, unspecified: Secondary | ICD-10-CM | POA: Diagnosis not present

## 2015-12-05 DIAGNOSIS — F4321 Adjustment disorder with depressed mood: Secondary | ICD-10-CM

## 2015-12-05 NOTE — Assessment & Plan Note (Signed)
sustained the right forehead contusion from the fall 11/30/15, stable, no focal neurological symptoms since the fall. Denied chest pain, dizziness, palpitation associated with the fall.  Update CBC, CMP, TSH

## 2015-12-05 NOTE — Assessment & Plan Note (Signed)
Stable, continue Omeprazole 20mg daily.  

## 2015-12-05 NOTE — Assessment & Plan Note (Signed)
Managed with Myrbetriq, average bathroom trips 2x/night. 

## 2015-12-05 NOTE — Assessment & Plan Note (Signed)
Memory lapses. Continue AL for care needs 

## 2015-12-05 NOTE — Assessment & Plan Note (Signed)
seems better in her facial looks and conversation. Continue to observe.  Mirtazapine 7.5mg ,

## 2015-12-05 NOTE — Assessment & Plan Note (Signed)
Wobbly gait, contributory to falling.

## 2015-12-05 NOTE — Progress Notes (Deleted)
Location:  [facility] Room Number: [place] Room Number: 11Place of Service: ALF [phone]) ALF (13) Provider: Mast, Manxie  NP  Jeanmarie Hubert, MD  Patient Care Team: [name], MD as PCP - General (Internal Medicine) Newton Pigg, MD as Consulting Physician (Obstetrics and Gynecology) Monna Fam, MD as Consulting Physician (Ophthalmology) Melida Quitter, MD as Consulting Physician (Otolaryngology) Myrlene Broker, MD as Consulting Physician (Urology) [place] Otho Darner, NP as Nurse Practitioner (Internal Medicine)  Extended Emergency Contact Information Primary Emergency Contact: Kohlmann,[name] Address: [place], [place] Phone: [phone] Work Phone: [phone] Mobile Phone: [phone] Relation: Son Secondary Emergency Contact: Teofilo [place] of [place] Phone: [phone] Work Phone: [phone] Relation: Friend  Code Status:  Full Code Goals of care: Advanced Directive information Advanced Directives 12/05/2015  Does patient have an advance directive? Yes  Type of Paramedic of [place];Living will  Does patient want to make changes to advanced directive? No - Patient declined  Copy of advanced directive(s) in chart? Yes     Chief Complaint  Patient presents with  . Acute Visit    [place] on 10/26, hit head(lump o the right side)    HPI:  Pt is a 82 y.o. female seen today for an acute visit for    Past Medical History:  Diagnosis Date  . Asthma   . Carpal tunnel syndrome 03/07/2009  . Disturbance of skin sensation 02/21/2009  . Diverticulosis of colon (without mention of hemorrhage) 02/16/2009  . Fracture of upper end of left humerus 11/18/14  . 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 Hypothyroid   . Osteoarthrosis, unspecified whether generalized or localized, unspecified site 02/16/2009  . Other  abnormal blood chemistry 06/19/2010   hyperglycemia  . Other and unspecified hyperlipidemia 10/24/2009  . Other atopic dermatitis and related conditions 02/16/2009  . Otosclerosis, unspecified 02/22/2003  . Pain in limb 12/05/2009  . Senile osteoporosis 06/16/2010  . Sensorineural hearing loss, unspecified 02/21/2009  . Syncope and collapse 05/30/2009  . Unspecified nasal polyp 02/21/2009  . Unspecified urinary incontinence 02/21/2009   Past Surgical History:  Procedure Laterality Date  . ABDOMINAL HYSTERECTOMY  1999   Dr. Collier Bullock  . BREAST BIOPSY  07/16/1990   benign left breast needle biopsy  Dr. Margot Chimes  . CARPAL TUNNEL RELEASE  04/07/2009   right Dr. Daylene Katayama  . CATARACT EXTRACTION W/ INTRAOCULAR LENS  IMPLANT, BILATERAL Bilateral   . TRIGGER FINGER RELEASE Left 08/09/2014   Procedure: RELEASE TRIGGER FINGER/A-1 PULLEY LEFT MIDDLE FINGER;  Surgeon: Daryll Brod, MD;  Location: [facility];  Service: Orthopedics;  Laterality: Left;  REGIONAL/FAB    Allergies  Allergen Reactions  . Avelox [Moxifloxacin Hcl In Nacl] Itching  . Doxycycline   . Hista-Tabs [Triprolidine-Pse] Other (See Comments)    Passed out  . Septra [Sulfamethoxazole-Trimethoprim]   . Sulfa Antibiotics     Itching/rash (all over)      Medication List       Accurate as of 12/05/15 12:35 PM. Always use your most recent med list.          acetaminophen 325 MG tablet Commonly known as:  TYLENOL Take 2 tablets (650 mg total) by mouth every 6 (six) hours as needed for mild pain or moderate pain.   Calcium-Vitamin D-Vitamin K 500-100-40 MG-UNT-MCG Chew Take 1 chew  daily by mouth.   feeding supplement Liqd Take 1 Container by mouth 3 (three) times daily between meals.   Fluticasone-Salmeterol 250-50 MCG/DOSE Aepb Commonly known as:  ADVAIR DISKUS Inhale once daily to help breathing   furosemide 40 MG tablet Commonly known as:  LASIX Take 40 mg by mouth every morning.   levothyroxine 25 MCG  tablet Commonly known as:  SYNTHROID, LEVOTHROID Take 25 mcg by mouth daily before breakfast.   meloxicam 7.5 MG tablet Commonly known as:  MOBIC Take 7.5 mg by mouth daily.   mirtazapine 7.5 MG tablet Commonly known as:  REMERON Take 7.5 mg by mouth at bedtime.   montelukast 10 MG tablet Commonly known as:  SINGULAIR Take one tablet by mouth once daily to help breathing   multivitamin with minerals tablet Take 1 tablet by mouth daily. Take 1 tablet as a vitamin supplement.   MYRBETRIQ 25 MG Tb24 tablet Generic drug:  mirabegron ER TAKE ONE TABLET BY MOUTH ONCE DAILY   omeprazole 20 MG capsule Commonly known as:  PRILOSEC Take 20 mg by mouth daily. Take 1 tablet daily for heartburn or stomach reflux.   raloxifene 60 MG tablet Commonly known as:  EVISTA Take 1 tablet by mouth  daily to treat osteoporosis   VENTOLIN HFA 108 (90 Base) MCG/ACT inhaler Generic drug:  albuterol Inhale 2 puffs by mouth every 6 hours as needed for shortness of breath       Review of Systems  Immunization History  Administered Date(s) Administered  . Influenza Whole 12/27/2011, 11/05/2012  . Influenza-Unspecified 11/18/2013, 11/03/2014, 11/16/2015  . PPD Test 04/04/2008, 11/23/2014  . Pneumococcal Polysaccharide-23 02/05/2008, 06/03/2008  . Td 02/05/2008, 06/03/2008   Pertinent  Health Maintenance Due  Topic Date Due  . DEXA SCAN  04/20/1988  . PNA vac Low Risk Adult (2 of 2 - PCV13) 06/03/2009  . INFLUENZA VACCINE  Completed   Fall Risk  05/30/2015 05/16/2015 04/25/2015 01/02/2015 09/06/2014  Falls in the past year? Yes No No Yes Yes  Number falls in past yr: 1 - - 1 1  Injury with Fall? Yes - - Yes Yes  Risk Factor Category  - - - High Fall Risk -  Risk for fall due to : History of fall(s);Impaired balance/gait;Impaired mobility - - History of fall(s) -  Follow up Falls evaluation completed;Education provided - - Falls evaluation completed -   Functional Status Survey:    Vitals:    12/05/15 1232  BP: 140/80  Pulse: 76  Resp: 20  Temp: 98.9 F (37.2 C)  SpO2: 100%  Weight: 131 lb 8 oz (59.6 kg)  Height: 4\' 11"  (1.499 m)   Body mass index is 26.56 kg/m. Physical Exam  Labs reviewed:  Recent Labs  04/20/15 04/24/15 07/04/15  NA 138 142 138  K 5.5* 4.6 3.9  BUN 25* 24* 20  CREATININE 0.8 0.9 1.0    Recent Labs  02/28/15 04/20/15 07/04/15  AST 20 39* 18  ALT 25 18 12   ALKPHOS 56 58 62    Recent Labs  02/28/15 04/20/15 07/04/15  WBC 18.6 8.6 14.7  HGB 14.0 14.2 12.9  HCT 44 44 39  PLT 282 262 272   Lab Results  Component Value Date   TSH 1.51 08/07/2015   No results found for: HGBA1C Lab Results  Component Value Date   CHOL 211 (A) 02/14/2014   HDL 47 02/14/2014   LDLCALC 133 02/14/2014   TRIG 155 02/14/2014    Significant Diagnostic  Results in last 30 days:  No results found.  Assessment/Plan There are no diagnoses linked to this encounter.   Family/ staff Communication:   Labs/tests ordered:

## 2015-12-05 NOTE — Assessment & Plan Note (Signed)
Chronic trace dependent edema BLE, R>L, BNP 22.1 01/05/15. Continue Furosemide 40mg  po daily, 07/04/15 Na 138, K 3.9, Bun 20, creat 1.0 Update CMP

## 2015-12-05 NOTE — Progress Notes (Signed)
Location:  [facility] Room Number: 11 Place of Service:  ALF [phone]) Provider:  Mardy Lucier, Manxie  NP  Jeanmarie Hubert, MD  Patient Care Team: [name], MD as PCP - General (Internal Medicine) Newton Pigg, MD as Consulting Physician (Obstetrics and Gynecology) Monna Fam, MD as Consulting Physician (Ophthalmology) Melida Quitter, MD as Consulting Physician (Otolaryngology) Myrlene Broker, MD as Consulting Physician (Urology) [place] Demani [place] Otho Darner, NP as Nurse Practitioner (Internal Medicine)  Extended Emergency Contact Information Primary Emergency Contact: Hauge,[name] Address: [place], [place] Phone: [phone] Work Phone: [phone] Mobile Phone: [phone] Relation: Son Secondary Emergency Contact: Teofilo [place] of [place] Phone: [phone] Work Phone: [phone] Relation: Friend  Code Status:  DNR Goals of care: Advanced Directive information Advanced Directives 12/05/2015  Does patient have an advance directive? Yes  Type of Paramedic of [place];Living will  Does patient want to make changes to advanced directive? No - Patient declined  Copy of advanced directive(s) in chart? Yes     Chief Complaint  Patient presents with  . Acute Visit    [place] on 10/26, hit head(lump o the right side)    HPI:  Pt is a 82 y.o. female seen today for an acute visit for sustained the right forehead contusion from the fall 11/30/15, stable, no focal neurological symptoms since the fall. Denied chest pain, dizziness, palpitation associated with the fall.     Hx of depression, Mirtazapine 7.5mg  improved, smile, sleeps, eats better, hypothyroidism, takes Levothyroxine 53mcg daily, last TSH 1.51 08/07/15, average bathroom trips x2/night while on Myrbetriq 25mg  daily. GERD is asymptomatic while on Omeprazole. Left shoulder, healed fx, left  fingers(left ring finger) fxs of the left shoulder and 4th finger released from contracture. BLE edema, trace, mainly in the left ankle, taking Furosemide 40mg  daily  Past Medical History:  Diagnosis Date  . Asthma   . Carpal tunnel syndrome 03/07/2009  . Disturbance of skin sensation 02/21/2009  . Diverticulosis of colon (without mention of hemorrhage) 02/16/2009  . Fracture of upper end of left humerus 11/18/14  . 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 Hypothyroid   . Osteoarthrosis, unspecified whether generalized or localized, unspecified site 02/16/2009  . Other abnormal blood chemistry 06/19/2010   hyperglycemia  . Other and unspecified hyperlipidemia 10/24/2009  . Other atopic dermatitis and related conditions 02/16/2009  . Otosclerosis, unspecified 02/22/2003  . Pain in limb 12/05/2009  . Senile osteoporosis 06/16/2010  . Sensorineural hearing loss, unspecified 02/21/2009  . Syncope and collapse 05/30/2009  . Unspecified nasal polyp 02/21/2009  . Unspecified urinary incontinence 02/21/2009   Past Surgical History:  Procedure Laterality Date  . ABDOMINAL HYSTERECTOMY  1999   Dr. Collier Bullock  . BREAST BIOPSY  07/16/1990   benign left breast needle biopsy  Dr. Margot Chimes  . CARPAL TUNNEL RELEASE  04/07/2009   right Dr. Daylene Katayama  . CATARACT EXTRACTION W/ INTRAOCULAR LENS  IMPLANT, BILATERAL Bilateral   . TRIGGER FINGER RELEASE Left 08/09/2014   Procedure: RELEASE TRIGGER FINGER/A-1 PULLEY LEFT MIDDLE FINGER;  Surgeon: Daryll Brod, MD;  Location: [facility];  Service: Orthopedics;  Laterality: Left;  REGIONAL/FAB    Allergies  Allergen Reactions  . Avelox [Moxifloxacin Hcl In Nacl] Itching  . Doxycycline   . Hista-Tabs [Triprolidine-Pse] Other (See Comments)    Passed out  . Septra [Sulfamethoxazole-Trimethoprim]   .  Sulfa Antibiotics     Itching/rash (all over)      Medication List       Accurate as of 12/05/15  3:36 PM. Always use your most recent med list.            acetaminophen 325 MG tablet Commonly known as:  TYLENOL Take 2 tablets (650 mg total) by mouth every 6 (six) hours as needed for mild pain or moderate pain.   Calcium-Vitamin D-Vitamin K 500-100-40 MG-UNT-MCG Chew Take 1 chew daily by mouth.   feeding supplement Liqd Take 1 Container by mouth 3 (three) times daily between meals.   Fluticasone-Salmeterol 250-50 MCG/DOSE Aepb Commonly known as:  ADVAIR DISKUS Inhale once daily to help breathing   furosemide 40 MG tablet Commonly known as:  LASIX Take 40 mg by mouth every morning.   levothyroxine 25 MCG tablet Commonly known as:  SYNTHROID, LEVOTHROID Take 25 mcg by mouth daily before breakfast.   meloxicam 7.5 MG tablet Commonly known as:  MOBIC Take 7.5 mg by mouth daily.   mirtazapine 7.5 MG tablet Commonly known as:  REMERON Take 7.5 mg by mouth at bedtime.   montelukast 10 MG tablet Commonly known as:  SINGULAIR Take one tablet by mouth once daily to help breathing   multivitamin with minerals tablet Take 1 tablet by mouth daily. Take 1 tablet as a vitamin supplement.   MYRBETRIQ 25 MG Tb24 tablet Generic drug:  mirabegron ER TAKE ONE TABLET BY MOUTH ONCE DAILY   omeprazole 20 MG capsule Commonly known as:  PRILOSEC Take 20 mg by mouth daily. Take 1 tablet daily for heartburn or stomach reflux.   raloxifene 60 MG tablet Commonly known as:  EVISTA Take 1 tablet by mouth  daily to treat osteoporosis   VENTOLIN HFA 108 (90 Base) MCG/ACT inhaler Generic drug:  albuterol Inhale 2 puffs by mouth every 6 hours as needed for shortness of breath       Review of Systems  Constitutional: Negative for activity change, appetite change, fatigue, fever and unexpected weight change.  HENT: Positive for hearing loss. Negative for congestion and nosebleeds.   Respiratory: Positive for shortness of breath. Negative for cough, wheezing and stridor.   Cardiovascular: Positive for leg swelling. Negative for chest pain and  palpitations.       Trace BLE  Endocrine: Negative for polydipsia.  Genitourinary: Negative for dysuria, flank pain and frequency.  Musculoskeletal: Negative for myalgias.       Scar right palm from release of 4th finger contracture Left shoulder fx 11/06/14, in sling, f/u Ortho, pain with movement  Skin: Negative for pallor and rash.       The right forehead contusion  Neurological: Negative for dizziness, tremors, seizures, weakness and headaches.  Psychiatric/Behavioral: Positive for dysphoric mood.    Immunization History  Administered Date(s) Administered  . Influenza Whole 12/27/2011, 11/05/2012  . Influenza-Unspecified 11/18/2013, 11/03/2014, 11/16/2015  . PPD Test 04/04/2008, 11/23/2014  . Pneumococcal Polysaccharide-23 02/05/2008, 06/03/2008  . Td 02/05/2008, 06/03/2008   Pertinent  Health Maintenance Due  Topic Date Due  . DEXA SCAN  04/20/1988  . PNA vac Low Risk Adult (2 of 2 - PCV13) 06/03/2009  . INFLUENZA VACCINE  Completed   Fall Risk  05/30/2015 05/16/2015 04/25/2015 01/02/2015 09/06/2014  Falls in the past year? Yes No No Yes Yes  Number falls in past yr: 1 - - 1 1  Injury with Fall? Yes - - Yes Yes  Risk Factor Category  - - -  High Fall Risk -  Risk for fall due to : History of fall(s);Impaired balance/gait;Impaired mobility - - History of fall(s) -  Follow up Falls evaluation completed;Education provided - - Falls evaluation completed -   Functional Status Survey:    Vitals:   12/05/15 1232  BP: 140/80  Pulse: 76  Resp: 20  Temp: 98.9 F (37.2 C)  SpO2: 100%  Weight: 131 lb 8 oz (59.6 kg)  Height: 4\' 11"  (1.499 m)   Body mass index is 26.56 kg/m. Physical Exam  Constitutional: She is oriented to person, place, and time. She appears well-developed and well-nourished. No distress.  HENT:  Head: Normocephalic and atraumatic.  Right Ear: External ear normal.  Left Ear: External ear normal.  Nose: Nose normal.  Mouth/Throat: Oropharynx is clear and  moist.  Mild hearing deficit  Eyes:  Corrective lenses  Neck: No JVD present. No tracheal deviation present. No thyromegaly present.  Cardiovascular: Normal rate, regular rhythm, normal heart sounds and intact distal pulses.  Exam reveals no gallop and no friction rub.   No murmur heard. Pulmonary/Chest: No respiratory distress. She has no wheezes. She has rales. She exhibits no tenderness.  Abdominal: She exhibits no distension and no mass. There is no tenderness.  Musculoskeletal: She exhibits edema and tenderness.  Unstable gait. Using cane. Right 4th finger contracture has been released. Left shoulder fx 11/06/14, decreased ROM and pain with movement. Chronic trace edema BLE, R>L Continue to ambulate with walker.  The left 3rd finger extension contracture.     Lymphadenopathy:    She has no cervical adenopathy.  Neurological: She is alert and oriented to person, place, and time. No cranial nerve deficit. Coordination normal.  10/27/12 MMSE 28/30. Passed clock drawing.  Skin: No rash noted. No erythema. No pallor.  The right forehead contusion   Psychiatric: She has a normal mood and affect. Her behavior is normal. Judgment and thought content normal.    Labs reviewed:  Recent Labs  04/20/15 04/24/15 07/04/15  NA 138 142 138  K 5.5* 4.6 3.9  BUN 25* 24* 20  CREATININE 0.8 0.9 1.0    Recent Labs  02/28/15 04/20/15 07/04/15  AST 20 39* 18  ALT 25 18 12   ALKPHOS 56 58 62    Recent Labs  02/28/15 04/20/15 07/04/15  WBC 18.6 8.6 14.7  HGB 14.0 14.2 12.9  HCT 44 44 39  PLT 282 262 272   Lab Results  Component Value Date   TSH 1.51 08/07/2015   No results found for: HGBA1C Lab Results  Component Value Date   CHOL 211 (A) 02/14/2014   HDL 47 02/14/2014   LDLCALC 133 02/14/2014   TRIG 155 02/14/2014    Significant Diagnostic Results in last 30 days:  No results found.  Assessment/Plan Forehead contusion sustained the right forehead contusion from the fall  11/30/15, stable, no focal neurological symptoms since the fall. Denied chest pain, dizziness, palpitation associated with the fall.  Update CBC, CMP, TSH  GERD (gastroesophageal reflux disease) Stable, continue Omeprazole 20mg  daily.   Hypothyroid 08/07/15 TSH 1.51,  continue Synthroid 33mcg, update TSH   Hypertonicity of bladder Managed with Myrbetriq, average bathroom trips 2x/night.  Memory loss Memory lapses. Continue AL for care needs  Abnormality of gait Wobbly gait, contributory to falling.   Edema Chronic trace dependent edema BLE, R>L, BNP 22.1 01/05/15. Continue Furosemide 40mg  po daily, 07/04/15 Na 138, K 3.9, Bun 20, creat 1.0 Update CMP  Situational depression seems better in  her facial looks and conversation. Continue to observe.  Mirtazapine 7.5mg ,     Family/ staff Communication: continue AL for care assistance.   Labs/tests ordered:  CBC CMP TSH

## 2015-12-05 NOTE — Assessment & Plan Note (Signed)
08/07/15 TSH 1.51,  continue Synthroid 45mcg, update TSH

## 2015-12-07 ENCOUNTER — Encounter: Payer: Self-pay | Admitting: Podiatry

## 2015-12-07 ENCOUNTER — Ambulatory Visit (INDEPENDENT_AMBULATORY_CARE_PROVIDER_SITE_OTHER): Payer: Medicare Other | Admitting: Podiatry

## 2015-12-07 ENCOUNTER — Ambulatory Visit (INDEPENDENT_AMBULATORY_CARE_PROVIDER_SITE_OTHER): Payer: Medicare Other

## 2015-12-07 VITALS — Resp 16 | Ht 60.0 in | Wt 130.0 lb

## 2015-12-07 DIAGNOSIS — M2041 Other hammer toe(s) (acquired), right foot: Secondary | ICD-10-CM

## 2015-12-07 DIAGNOSIS — I10 Essential (primary) hypertension: Secondary | ICD-10-CM | POA: Diagnosis not present

## 2015-12-07 DIAGNOSIS — M2042 Other hammer toe(s) (acquired), left foot: Secondary | ICD-10-CM

## 2015-12-07 DIAGNOSIS — M79676 Pain in unspecified toe(s): Secondary | ICD-10-CM | POA: Diagnosis not present

## 2015-12-07 DIAGNOSIS — M2010 Hallux valgus (acquired), unspecified foot: Secondary | ICD-10-CM

## 2015-12-07 DIAGNOSIS — D649 Anemia, unspecified: Secondary | ICD-10-CM | POA: Diagnosis not present

## 2015-12-07 DIAGNOSIS — E039 Hypothyroidism, unspecified: Secondary | ICD-10-CM | POA: Diagnosis not present

## 2015-12-07 DIAGNOSIS — B351 Tinea unguium: Secondary | ICD-10-CM

## 2015-12-07 NOTE — Progress Notes (Signed)
   Subjective:    Patient ID: Kimberly Greer, female    DOB: [birth year], 82 y.o.   MRN: [id number]  HPI: She presents today chief complaint of painful bilateral toenails. She states that her toes rub within her toenails are long.    Review of Systems  Respiratory: Positive for shortness of breath.   All other systems reviewed and are negative.      Objective:   Physical Exam: Vital signs are stable alert and oriented 3. Pulses are strongly palpable. Neurologic sensorium is intact. Deep tendon reflexes are intact. Muscle strength is 5 over 5 dorsiflexion 5 for inverters evertors. He has a thick yellow long dystrophic clinic for mycotic painful palpation as well as debridement. No signs of open wounds or lesions. No signs of infection.          Assessment & Plan:  Assessment: Pain limb similar onychomycosis.  Plan: Debridement of toenails 1 through 5 bilateral.

## 2015-12-08 ENCOUNTER — Non-Acute Institutional Stay: Payer: Medicare Other | Admitting: Nurse Practitioner

## 2015-12-08 DIAGNOSIS — K219 Gastro-esophageal reflux disease without esophagitis: Secondary | ICD-10-CM

## 2015-12-08 DIAGNOSIS — F4321 Adjustment disorder with depressed mood: Secondary | ICD-10-CM

## 2015-12-08 DIAGNOSIS — J45909 Unspecified asthma, uncomplicated: Secondary | ICD-10-CM | POA: Diagnosis not present

## 2015-12-08 DIAGNOSIS — M159 Polyosteoarthritis, unspecified: Secondary | ICD-10-CM

## 2015-12-08 DIAGNOSIS — R609 Edema, unspecified: Secondary | ICD-10-CM | POA: Diagnosis not present

## 2015-12-08 DIAGNOSIS — S0083XD Contusion of other part of head, subsequent encounter: Secondary | ICD-10-CM

## 2015-12-08 DIAGNOSIS — M150 Primary generalized (osteo)arthritis: Secondary | ICD-10-CM | POA: Diagnosis not present

## 2015-12-08 DIAGNOSIS — E039 Hypothyroidism, unspecified: Secondary | ICD-10-CM | POA: Diagnosis not present

## 2015-12-08 DIAGNOSIS — N318 Other neuromuscular dysfunction of bladder: Secondary | ICD-10-CM

## 2015-12-08 NOTE — Progress Notes (Signed)
[facility]    Place of Service:   [facility] Room 11  Provider:  Lamija Besse, Manxie  NP  Jeanmarie Hubert, MD  Patient Care Team: [name], MD as PCP - General (Internal Medicine) Newton Pigg, MD as Consulting Physician (Obstetrics and Gynecology) Monna Fam, MD as Consulting Physician (Ophthalmology) Melida Quitter, MD as Consulting Physician (Otolaryngology) Myrlene Broker, MD as Consulting Physician (Urology) [place] Lus Kriegel Otho Darner, NP as Nurse Practitioner (Internal Medicine)  Extended Emergency Contact Information Primary Emergency Contact: Demers,[name] Address: [place], [place] Phone: [phone] Work Phone: [phone] Mobile Phone: [phone] Relation: Son Secondary Emergency Contact: Teofilo Pod [place] of [place] Phone: [phone] Work Phone: [phone] Relation: Friend  Code Status:  DNR Goals of care: Advanced Directive information Advanced Directives 12/05/2015  Does patient have an advance directive? Yes  Type of Paramedic of [place];Living will  Does patient want to make changes to advanced directive? No - Patient declined  Copy of advanced directive(s) in chart? Yes     Chief Complaint  Patient presents with  . Medical Management of Chronic Issues    HPI:  Pt is a 82 y.o. female seen today for evaluation of chronic medical conditions.  Hx of depression, Mirtazapine 7.5mg  improved, smile, sleeps, eats better, hypothyroidism, takes Levothyroxine 4mcg daily, last TSH 1.35 12/07/15, average bathroom trips x2/night while on Myrbetriq 25mg  daily. GERD is asymptomatic while on Omeprazole. Left shoulder, healed fx, left fingers(left ring finger) fxs of the left shoulder and 4th finger released from contracture. BLE edema, trace, mainly in the left ankle, taking Furosemide 40mg  daily  Past Medical History:  Diagnosis Date    . Asthma   . Carpal tunnel syndrome 03/07/2009  . Disturbance of skin sensation 02/21/2009  . Diverticulosis of colon (without mention of hemorrhage) 02/16/2009  . Fracture of upper end of left humerus 11/18/14  . 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 Hypothyroid   . Osteoarthrosis, unspecified whether generalized or localized, unspecified site 02/16/2009  . Other abnormal blood chemistry 06/19/2010   hyperglycemia  . Other and unspecified hyperlipidemia 10/24/2009  . Other atopic dermatitis and related conditions 02/16/2009  . Otosclerosis, unspecified 02/22/2003  . Pain in limb 12/05/2009  . Senile osteoporosis 06/16/2010  . Sensorineural hearing loss, unspecified 02/21/2009  . Syncope and collapse 05/30/2009  . Unspecified nasal polyp 02/21/2009  . Unspecified urinary incontinence 02/21/2009   Past Surgical History:  Procedure Laterality Date  . ABDOMINAL HYSTERECTOMY  1999   Dr. Collier Bullock  . BREAST BIOPSY  07/16/1990   benign left breast needle biopsy  Dr. Margot Chimes  . CARPAL TUNNEL RELEASE  04/07/2009   right Dr. Daylene Katayama  . CATARACT EXTRACTION W/ INTRAOCULAR LENS  IMPLANT, BILATERAL Bilateral   . TRIGGER FINGER RELEASE Left 08/09/2014   Procedure: RELEASE TRIGGER FINGER/A-1 PULLEY LEFT MIDDLE FINGER;  Surgeon: Daryll Brod, MD;  Location: [facility];  Service: Orthopedics;  Laterality: Left;  REGIONAL/FAB    Allergies  Allergen Reactions  . Avelox [Moxifloxacin Hcl In Nacl] Itching  . Doxycycline   . Hista-Tabs [Triprolidine-Pse] Other (See Comments)    Passed out  . Septra [Sulfamethoxazole-Trimethoprim]   . Sulfa Antibiotics     Itching/rash (all over)      Medication List       Accurate as of 12/08/15  3:14 PM. Always use your  most recent med list.          acetaminophen 325 MG tablet Commonly known as:  TYLENOL Take 2 tablets (650 mg total) by mouth every 6 (six) hours as needed for mild pain or moderate pain.   Calcium-Vitamin D-Vitamin K 500-100-40 MG-UNT-MCG  Chew Take 1 chew daily by mouth.   feeding supplement Liqd Take 1 Container by mouth 3 (three) times daily between meals.   Fluticasone-Salmeterol 250-50 MCG/DOSE Aepb Commonly known as:  ADVAIR DISKUS Inhale once daily to help breathing   furosemide 40 MG tablet Commonly known as:  LASIX Take 40 mg by mouth every morning.   levothyroxine 25 MCG tablet Commonly known as:  SYNTHROID, LEVOTHROID Take 25 mcg by mouth daily before breakfast.   meloxicam 7.5 MG tablet Commonly known as:  MOBIC Take 7.5 mg by mouth daily.   mirtazapine 7.5 MG tablet Commonly known as:  REMERON Take 7.5 mg by mouth at bedtime.   montelukast 10 MG tablet Commonly known as:  SINGULAIR Take one tablet by mouth once daily to help breathing   multivitamin with minerals tablet Take 1 tablet by mouth daily. Take 1 tablet as a vitamin supplement.   MYRBETRIQ 25 MG Tb24 tablet Generic drug:  mirabegron ER TAKE ONE TABLET BY MOUTH ONCE DAILY   omeprazole 20 MG capsule Commonly known as:  PRILOSEC Take 20 mg by mouth daily. Take 1 tablet daily for heartburn or stomach reflux.   raloxifene 60 MG tablet Commonly known as:  EVISTA Take 1 tablet by mouth  daily to treat osteoporosis   VENTOLIN HFA 108 (90 Base) MCG/ACT inhaler Generic drug:  albuterol Inhale 2 puffs by mouth every 6 hours as needed for shortness of breath       Review of Systems  Constitutional: Negative for activity change, appetite change, fatigue, fever and unexpected weight change.  HENT: Positive for hearing loss. Negative for congestion and nosebleeds.   Respiratory: Positive for shortness of breath. Negative for cough, wheezing and stridor.   Cardiovascular: Positive for leg swelling. Negative for chest pain and palpitations.       Trace BLE  Endocrine: Negative for polydipsia.  Genitourinary: Negative for dysuria, flank pain and frequency.  Musculoskeletal: Negative for myalgias.       Scar right palm from release of  4th finger contracture Left shoulder fx 11/06/14, in sling, f/u Ortho, pain with movement  Skin: Negative for pallor and rash.       The right forehead contusion  Neurological: Negative for dizziness, tremors, seizures, weakness and headaches.  Psychiatric/Behavioral: Positive for dysphoric mood.    Immunization History  Administered Date(s) Administered  . Influenza Whole 12/27/2011, 11/05/2012  . Influenza-Unspecified 11/18/2013, 11/03/2014, 11/16/2015  . PPD Test 04/04/2008, 11/23/2014  . Pneumococcal Polysaccharide-23 02/05/2008, 06/03/2008  . Td 02/05/2008, 06/03/2008   Pertinent  Health Maintenance Due  Topic Date Due  . DEXA SCAN  04/20/1988  . PNA vac Low Risk Adult (2 of 2 - PCV13) 06/03/2009  . INFLUENZA VACCINE  Completed   Fall Risk  05/30/2015 05/16/2015 04/25/2015 01/02/2015 09/06/2014  Falls in the past year? Yes No No Yes Yes  Number falls in past yr: 1 - - 1 1  Injury with Fall? Yes - - Yes Yes  Risk Factor Category  - - - High Fall Risk -  Risk for fall due to : History of fall(s);Impaired balance/gait;Impaired mobility - - History of fall(s) -  Follow up Falls evaluation completed;Education provided - - Falls  evaluation completed -   Functional Status Survey:    There were no vitals filed for this visit. There is no height or weight on file to calculate BMI. Physical Exam  Constitutional: She is oriented to person, place, and time. She appears well-developed and well-nourished. No distress.  HENT:  Head: Normocephalic and atraumatic.  Right Ear: External ear normal.  Left Ear: External ear normal.  Nose: Nose normal.  Mouth/Throat: Oropharynx is clear and moist.  Mild hearing deficit  Eyes:  Corrective lenses  Neck: No JVD present. No tracheal deviation present. No thyromegaly present.  Cardiovascular: Normal rate, regular rhythm, normal heart sounds and intact distal pulses.  Exam reveals no gallop and no friction rub.   No murmur heard. Pulmonary/Chest:  No respiratory distress. She has no wheezes. She has rales. She exhibits no tenderness.  Abdominal: She exhibits no distension and no mass. There is no tenderness.  Musculoskeletal: She exhibits edema and tenderness.  Unstable gait. Using cane. Right 4th finger contracture has been released. Left shoulder fx 11/06/14, decreased ROM and pain with movement. Chronic trace edema BLE, R>L Continue to ambulate with walker.  The left 3rd finger extension contracture.     Lymphadenopathy:    She has no cervical adenopathy.  Neurological: She is alert and oriented to person, place, and time. No cranial nerve deficit. Coordination normal.  10/27/12 MMSE 28/30. Passed clock drawing.  Skin: No rash noted. No erythema. No pallor.  The right forehead contusion   Psychiatric: She has a normal mood and affect. Her behavior is normal. Judgment and thought content normal.    Labs reviewed:  Recent Labs  04/20/15 04/24/15 07/04/15  NA 138 142 138  K 5.5* 4.6 3.9  BUN 25* 24* 20  CREATININE 0.8 0.9 1.0    Recent Labs  02/28/15 04/20/15 07/04/15  AST 20 39* 18  ALT 25 18 12   ALKPHOS 56 58 62    Recent Labs  02/28/15 04/20/15 07/04/15  WBC 18.6 8.6 14.7  HGB 14.0 14.2 12.9  HCT 44 44 39  PLT 282 262 272   Lab Results  Component Value Date   TSH 1.51 08/07/2015   No results found for: HGBA1C Lab Results  Component Value Date   CHOL 211 (A) 02/14/2014   HDL 47 02/14/2014   LDLCALC 133 02/14/2014   TRIG 155 02/14/2014    Significant Diagnostic Results in last 30 days:  No results found.  Assessment/Plan: Intrinsic asthma Stable, chronic hacking cough, continue Advair, Flonase, Singulair, and Ventolin prn.   GERD (gastroesophageal reflux disease) Stable, continue Omeprazole 20mg  daily. 12/07/15 wbc 9.0, Hgb 12.4, plt 274, Na 142, K 4.4, Bun 17, creat 0.98   Hypothyroid 12/07/15 TSH 1.35,  continue Synthroid 35mcg  Osteoarthritis Multiple sites, continue Meloxicam 7.5mg  since  09/21/15, Tylenol prn  Hypertonicity of bladder Managed with Myrbetriq, average bathroom trips 2x/night.   Edema Chronic trace dependent edema BLE, R>L, BNP 22.1 01/05/15. Continue Furosemide 40mg  po daily, 12/07/15 Na 142, K 4.4, Bun 17, creat 0.98  Situational depression seems better in her facial looks and conversation. Continue to observe.  Continue Mirtazapine 7.5mg ,  Forehead contusion healing      Family/ staff Communication: continue AL for care assistance.   Labs/tests ordered:  CBC CMP TSH done 12/07/15

## 2015-12-08 NOTE — Assessment & Plan Note (Signed)
Chronic trace dependent edema BLE, R>L, BNP 22.1 01/05/15. Continue Furosemide 40mg  po daily, 12/07/15 Na 142, K 4.4, Bun 17, creat 0.98

## 2015-12-08 NOTE — Assessment & Plan Note (Signed)
seems better in her facial looks and conversation. Continue to observe.  Continue Mirtazapine 7.5mg ,

## 2015-12-08 NOTE — Assessment & Plan Note (Signed)
Stable, continue Omeprazole 20mg  daily. 12/07/15 wbc 9.0, Hgb 12.4, plt 274, Na 142, K 4.4, Bun 17, creat 0.98

## 2015-12-08 NOTE — Assessment & Plan Note (Signed)
Managed with Myrbetriq, average bathroom trips 2x/night. 

## 2015-12-08 NOTE — Assessment & Plan Note (Signed)
healing

## 2015-12-08 NOTE — Assessment & Plan Note (Signed)
Stable, chronic hacking cough, continue Advair, Flonase, Singulair, and Ventolin prn 

## 2015-12-08 NOTE — Assessment & Plan Note (Signed)
12/07/15 TSH 1.35,  continue Synthroid 31mcg

## 2015-12-08 NOTE — Assessment & Plan Note (Signed)
Multiple sites, continue Meloxicam 7.5mg since 09/21/15, Tylenol prn 

## 2016-01-12 ENCOUNTER — Encounter: Payer: Self-pay | Admitting: Nurse Practitioner

## 2016-01-12 ENCOUNTER — Other Ambulatory Visit: Payer: Self-pay | Admitting: Nurse Practitioner

## 2016-01-12 ENCOUNTER — Non-Acute Institutional Stay: Payer: Medicare Other | Admitting: Nurse Practitioner

## 2016-01-12 DIAGNOSIS — F4321 Adjustment disorder with depressed mood: Secondary | ICD-10-CM

## 2016-01-12 DIAGNOSIS — N318 Other neuromuscular dysfunction of bladder: Secondary | ICD-10-CM

## 2016-01-12 DIAGNOSIS — K219 Gastro-esophageal reflux disease without esophagitis: Secondary | ICD-10-CM

## 2016-01-12 DIAGNOSIS — M159 Polyosteoarthritis, unspecified: Secondary | ICD-10-CM

## 2016-01-12 DIAGNOSIS — R413 Other amnesia: Secondary | ICD-10-CM

## 2016-01-12 DIAGNOSIS — E039 Hypothyroidism, unspecified: Secondary | ICD-10-CM | POA: Diagnosis not present

## 2016-01-12 DIAGNOSIS — R609 Edema, unspecified: Secondary | ICD-10-CM

## 2016-01-12 DIAGNOSIS — M150 Primary generalized (osteo)arthritis: Secondary | ICD-10-CM | POA: Diagnosis not present

## 2016-01-12 NOTE — Assessment & Plan Note (Signed)
Chronic trace dependent edema BLE, R>L, BNP 22.1 01/05/15. Continue Furosemide 40mg  po daily, 12/07/15 Na 142, K 4.4, Bun 17, creat 0.98

## 2016-01-12 NOTE — Progress Notes (Unsigned)
Location:    [facility] Service:    Provider: Mast, Manxie NP  Jeanmarie Hubert, MD  Patient Care Team: [name], MD as PCP - General (Internal Medicine) Newton Pigg, MD as Consulting Physician (Obstetrics and Gynecology) Monna Fam, MD (Inactive) as Consulting Physician (Ophthalmology) Melida Quitter, MD as Consulting Physician (Otolaryngology) Myrlene Broker, MD as Consulting Physician (Urology) [place] Man Otho Darner, NP as Nurse Practitioner (Internal Medicine)  Extended Emergency Contact Information Primary Emergency Contact: Beeck,[name] Address: [place], [place] Phone: [phone] Work Phone: [phone] Mobile Phone: [phone] Relation: Son Secondary Emergency Contact: Teofilo Pod [place] of [place] Phone: [phone] Work Phone: [phone] Relation: Friend  Code Status:  Full Code Goals of care: Advanced Directive information Advanced Directives 01/12/2016  Does Patient Have a Medical Advance Directive? Yes  Type of Paramedic of [place];Living will  Does patient want to make changes to medical advance directive? No - Patient declined  Copy of [place] in Chart? Yes     Chief Complaint  Patient presents with  . Medical Management of Chronic Issues    HPI:  Pt is a 82 y.o. female seen today for medical management of chronic diseases.     Past Medical History:  Diagnosis Date  . Asthma   . Carpal tunnel syndrome 03/07/2009  . Disturbance of skin sensation 02/21/2009  . Diverticulosis of colon (without mention of hemorrhage) 02/16/2009  . Fracture of upper end of left humerus 11/18/14  . 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 Hypothyroid   . Osteoarthrosis, unspecified whether generalized or localized, unspecified site 02/16/2009  . Other abnormal blood chemistry 06/19/2010   hyperglycemia  . Other  and unspecified hyperlipidemia 10/24/2009  . Other atopic dermatitis and related conditions 02/16/2009  . Otosclerosis, unspecified 02/22/2003  . Pain in limb 12/05/2009  . Senile osteoporosis 06/16/2010  . Sensorineural hearing loss, unspecified 02/21/2009  . Syncope and collapse 05/30/2009  . Unspecified nasal polyp 02/21/2009  . Unspecified urinary incontinence 02/21/2009   Past Surgical History:  Procedure Laterality Date  . ABDOMINAL HYSTERECTOMY  1999   Dr. Collier Bullock  . BREAST BIOPSY  07/16/1990   benign left breast needle biopsy  Dr. Margot Chimes  . CARPAL TUNNEL RELEASE  04/07/2009   right Dr. Daylene Katayama  . CATARACT EXTRACTION W/ INTRAOCULAR LENS  IMPLANT, BILATERAL Bilateral   . TRIGGER FINGER RELEASE Left 08/09/2014   Procedure: RELEASE TRIGGER FINGER/A-1 PULLEY LEFT MIDDLE FINGER;  Surgeon: Daryll Brod, MD;  Location: [facility];  Service: Orthopedics;  Laterality: Left;  REGIONAL/FAB    Allergies  Allergen Reactions  . Avelox [Moxifloxacin Hcl In Nacl] Itching  . Doxycycline   . Hista-Tabs [Triprolidine-Pse] Other (See Comments)    Passed out  . Septra [Sulfamethoxazole-Trimethoprim]   . Sulfa Antibiotics     Itching/rash (all over)      Medication List       Accurate as of 01/12/16 10:34 AM. Always use your most recent med list.          acetaminophen 325 MG tablet Commonly known as:  TYLENOL Take 2 tablets (650 mg total) by mouth every 6 (six) hours as needed for mild pain or moderate pain.   feeding supplement Liqd Take 1 Container by mouth 3 (three) times daily between meals.   Fluticasone-Salmeterol 250-50 MCG/DOSE Aepb  Commonly known as:  ADVAIR DISKUS Inhale once daily to help breathing   furosemide 40 MG tablet Commonly known as:  LASIX Take 40 mg by mouth every morning.   levothyroxine 25 MCG tablet Commonly known as:  SYNTHROID, LEVOTHROID Take 25 mcg by mouth daily before breakfast.   meloxicam 7.5 MG tablet Commonly known as:  MOBIC Take 7.5  mg by mouth daily.   mirtazapine 7.5 MG tablet Commonly known as:  REMERON Take 7.5 mg by mouth at bedtime.   montelukast 10 MG tablet Commonly known as:  SINGULAIR Take one tablet by mouth once daily to help breathing   multivitamin with minerals tablet Take 1 tablet by mouth daily. Take 1 tablet as a vitamin supplement.   MYRBETRIQ 25 MG Tb24 tablet Generic drug:  mirabegron ER TAKE ONE TABLET BY MOUTH ONCE DAILY   omeprazole 20 MG capsule Commonly known as:  PRILOSEC Take 20 mg by mouth daily. Take 1 tablet daily for heartburn or stomach reflux.   raloxifene 60 MG tablet Commonly known as:  EVISTA Take 1 tablet by mouth  daily to treat osteoporosis   VENTOLIN HFA 108 (90 Base) MCG/ACT inhaler Generic drug:  albuterol Inhale 2 puffs by mouth every 6 hours as needed for shortness of breath       Review of Systems  Immunization History  Administered Date(s) Administered  . Influenza Whole 12/27/2011, 11/05/2012  . Influenza-Unspecified 11/18/2013, 11/03/2014, 11/16/2015  . PPD Test 04/04/2008, 11/23/2014  . Pneumococcal Polysaccharide-23 02/05/2008, 06/03/2008  . Td 02/05/2008, 06/03/2008   Pertinent  Health Maintenance Due  Topic Date Due  . DEXA SCAN  04/20/1988  . PNA vac Low Risk Adult (2 of 2 - PCV13) 06/03/2009  . INFLUENZA VACCINE  Completed   Fall Risk  05/30/2015 05/16/2015 04/25/2015 01/02/2015 09/06/2014  Falls in the past year? Yes No No Yes Yes  Number falls in past yr: 1 - - 1 1  Injury with Fall? Yes - - Yes Yes  Risk Factor Category  - - - High Fall Risk -  Risk for fall due to : History of fall(s);Impaired balance/gait;Impaired mobility - - History of fall(s) -  Follow up Falls evaluation completed;Education provided - - Falls evaluation completed -   Functional Status Survey:    Vitals:   01/12/16 1013  BP: (!) 116/49  Pulse: (!) 52  Resp: 16  Temp: 98.4 F (36.9 C)  Weight: 130 lb 8 oz (59.2 kg)  Height: 5' (1.524 m)   Body mass index  is 25.49 kg/m. Physical Exam  Labs reviewed:  Recent Labs  04/20/15 04/24/15 07/04/15  NA 138 142 138  K 5.5* 4.6 3.9  BUN 25* 24* 20  CREATININE 0.8 0.9 1.0    Recent Labs  02/28/15 04/20/15 07/04/15  AST 20 39* 18  ALT 25 18 12   ALKPHOS 56 58 62    Recent Labs  02/28/15 04/20/15 07/04/15  WBC 18.6 8.6 14.7  HGB 14.0 14.2 12.9  HCT 44 44 39  PLT 282 262 272   Lab Results  Component Value Date   TSH 1.51 08/07/2015   No results found for: HGBA1C Lab Results  Component Value Date   CHOL 211 (A) 02/14/2014   HDL 47 02/14/2014   LDLCALC 133 02/14/2014   TRIG 155 02/14/2014    Significant Diagnostic Results in last 30 days:  No results found.  Assessment/Plan There are no diagnoses linked to this encounter.   Family/ staff Communication:   Labs/tests  ordered:

## 2016-01-12 NOTE — Assessment & Plan Note (Signed)
Memory lapses. Continue AL for care needs 

## 2016-01-12 NOTE — Assessment & Plan Note (Signed)
12/07/15 TSH 1.35,  continue Synthroid 67mcg

## 2016-01-12 NOTE — Progress Notes (Signed)
Location:  [facility] Room Number: 11 Place of Service:  ALF [phone]) Provider: Marlana Latus  NP  Patient Care Team: [name], MD as PCP - General (Internal Medicine) Newton Pigg, MD as Consulting Physician (Obstetrics and Gynecology) Monna Fam, MD (Inactive) as Consulting Physician (Ophthalmology) Melida Quitter, MD as Consulting Physician (Otolaryngology) Myrlene Broker, MD as Consulting Physician (Urology) Aledo Briseyda Fehr Otho Darner, NP as Nurse Practitioner (Internal Medicine)  Extended Emergency Contact Information Primary Emergency Contact: Deboer,[name] Address: [place], [place] Phone: [phone] Work Phone: [phone] Mobile Phone: [phone] Relation: Son Secondary Emergency Contact: Teofilo Pod [place] Phone: [phone] Work Phone: [phone] Relation: Friend  Code Status: Full Code Goals of Care: Advanced Directive information Advanced Directives 01/12/2016  Does Patient Have a Medical Advance Directive? Yes  Type of Paramedic of [place];Living will  Does patient want to make changes to medical advance directive? No - Patient declined  Copy of [place] in Chart? Yes     Chief Complaint  Patient presents with  . Medical Management of Chronic Issues    HPI: Patient is a 82 y.o. female seen in today for evaluation of chronic medical conditions  Hx of depression, Mirtazapine 7.5mg  improved, smile, sleeps, eats better, hypothyroidism, takes Levothyroxine 46mcg daily, last TSH 1.35 12/07/15, average bathroom trips x2/night while on Myrbetriq 25mg  daily. GERD is asymptomatic while on Omeprazole. Left shoulder, healed fx, left fingers(left ring finger) fxs of the left shoulder and 4th finger released from contracture. BLE edema, trace, mainly in the left ankle, taking Furosemide 40mg  daily    Depression screen [facility] 2/9 05/30/2015 05/16/2015 01/02/2015 03/08/2014  Decreased Interest 0 0 0 0  Down, Depressed, Hopeless 0 0 0 0  PHQ - 2 Score 0 0 0 0    Fall Risk  05/30/2015 05/16/2015 04/25/2015 01/02/2015 09/06/2014  Falls in the past year? Yes No No Yes Yes  Number falls in past yr: 1 - - 1 1  Injury with Fall? Yes - - Yes Yes  Risk Factor Category  - - - High Fall Risk -  Risk for fall due to : History of fall(s);Impaired balance/gait;Impaired mobility - - History of fall(s) -  Follow up Falls evaluation completed;Education provided - - Falls evaluation completed -   MMSE - Mini Mental State Exam 03/08/2014 10/27/2012 06/23/2012  Orientation to time 2 5 4   Orientation to Place 4 5 5   Registration 3 3 3   Attention/ Calculation 5 3 5   Recall 2 3 1   Language- name 2 objects 2 2 2   Language- repeat 1 1 1   Language- follow 3 step command 3 3 3   Language- read & follow direction 1 1 1   Write a sentence 1 1 1   Copy design 1 1 1   Total score 25 28 27      Health Maintenance  Topic Date Due  . ZOSTAVAX  04/21/1983  . DEXA SCAN  04/20/1988  . PNA vac Low Risk Adult (2 of 2 - PCV13) 06/03/2009  . TETANUS/TDAP  06/04/2018  . INFLUENZA VACCINE  Completed    Urinary incontinence? Functional Status Survey:   Exercise? Diet? No exam data present Hearing:   Dentition: Pain:  Past Medical History:  Diagnosis Date  . Asthma   . Carpal tunnel syndrome 03/07/2009  . Disturbance of skin sensation  02/21/2009  . Diverticulosis of colon (without mention of hemorrhage) 02/16/2009  . Fracture of upper end of left humerus 11/18/14  . 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 Hypothyroid   . Osteoarthrosis, unspecified whether generalized or localized, unspecified site 02/16/2009  . Other abnormal blood chemistry 06/19/2010   hyperglycemia  . Other and unspecified hyperlipidemia 10/24/2009  . Other atopic dermatitis and related conditions 02/16/2009  . Otosclerosis, unspecified 02/22/2003  .  Pain in limb 12/05/2009  . Senile osteoporosis 06/16/2010  . Sensorineural hearing loss, unspecified 02/21/2009  . Syncope and collapse 05/30/2009  . Unspecified nasal polyp 02/21/2009  . Unspecified urinary incontinence 02/21/2009    Past Surgical History:  Procedure Laterality Date  . ABDOMINAL HYSTERECTOMY  1999   Dr. Collier Bullock  . BREAST BIOPSY  07/16/1990   benign left breast needle biopsy  Dr. Margot Chimes  . CARPAL TUNNEL RELEASE  04/07/2009   right Dr. Daylene Katayama  . CATARACT EXTRACTION W/ INTRAOCULAR LENS  IMPLANT, BILATERAL Bilateral   . TRIGGER FINGER RELEASE Left 08/09/2014   Procedure: RELEASE TRIGGER FINGER/A-1 PULLEY LEFT MIDDLE FINGER;  Surgeon: Daryll Brod, MD;  Location: [facility];  Service: Orthopedics;  Laterality: Left;  REGIONAL/FAB    Family History  Problem Relation Age of Onset  . Cancer Mother     stomach  . Cancer Father     bone  . Dementia Sister   . Cancer Brother     pancreataic  . Cancer Son     pancreatic    Social History   Social History  . Marital status: Widowed    Spouse name: N/A  . Number of children: N/A  . Years of education: N/A   Occupational History  . retired Sales promotion account executive    Social History Main Topics  . Smoking status: Never Smoker  . Smokeless tobacco: Never Used  . Alcohol use No  . Drug use: No  . Sexual activity: No   Other Topics Concern  . None   Social History Narrative   Lives at [facility] since 12/06/2008   Widowed 11/2006   Living Will   Walks with cane   Never smoked   Alcohol none   Exercise -walks daily    POA/Living Will    reports that she has never smoked. She has never used smokeless tobacco. She reports that she does not drink alcohol or use drugs.   Allergies  Allergen Reactions  . Avelox [Moxifloxacin Hcl In Nacl] Itching  . Doxycycline   . Hista-Tabs [Triprolidine-Pse] Other (See Comments)    Passed out  . Septra [Sulfamethoxazole-Trimethoprim]   . Sulfa Antibiotics      Itching/rash (all over)      Medication List       Accurate as of 01/12/16  4:05 PM. Always use your most recent med list.          acetaminophen 325 MG tablet Commonly known as:  TYLENOL Take 2 tablets (650 mg total) by mouth every 6 (six) hours as needed for mild pain or moderate pain.   feeding supplement Liqd Take 1 Container by mouth 3 (three) times daily between meals.   Fluticasone-Salmeterol 250-50 MCG/DOSE Aepb Commonly known as:  ADVAIR DISKUS Inhale once daily to help breathing   furosemide 40 MG tablet Commonly known as:  LASIX Take 40 mg by mouth every morning.   levothyroxine 25 MCG tablet Commonly known as:  SYNTHROID, LEVOTHROID Take 25 mcg by mouth daily before breakfast.   meloxicam  7.5 MG tablet Commonly known as:  MOBIC Take 7.5 mg by mouth daily.   montelukast 10 MG tablet Commonly known as:  SINGULAIR Take one tablet by mouth once daily to help breathing   multivitamin with minerals tablet Take 1 tablet by mouth daily. Take 1 tablet as a vitamin supplement.   MYRBETRIQ 25 MG Tb24 tablet Generic drug:  mirabegron ER TAKE ONE TABLET BY MOUTH ONCE DAILY   omeprazole 20 MG capsule Commonly known as:  PRILOSEC Take 20 mg by mouth daily. Take 1 tablet daily for heartburn or stomach reflux.   raloxifene 60 MG tablet Commonly known as:  EVISTA Take 1 tablet by mouth  daily to treat osteoporosis   VENTOLIN HFA 108 (90 Base) MCG/ACT inhaler Generic drug:  albuterol Inhale 2 puffs by mouth every 6 hours as needed for shortness of breath        Review of Systems:  Review of Systems  Constitutional: Negative for activity change, appetite change, fatigue, fever and unexpected weight change.  HENT: Positive for hearing loss. Negative for congestion and nosebleeds.   Respiratory: Positive for shortness of breath. Negative for cough, wheezing and stridor.   Cardiovascular: Positive for leg swelling. Negative for chest pain and palpitations.        Trace BLE  Endocrine: Negative for polydipsia.  Genitourinary: Negative for dysuria, flank pain and frequency.  Musculoskeletal: Negative for myalgias.       Scar right palm from release of 4th finger contracture Left shoulder fx 11/06/14, in sling, f/u Ortho, pain with movement  Skin: Negative for pallor and rash.       The right forehead contusion  Neurological: Negative for dizziness, tremors, seizures, weakness and headaches.  Psychiatric/Behavioral: Positive for dysphoric mood.    Physical Exam: Vitals:   01/12/16 1549  BP: (!) 116/49  Pulse: 67  Resp: 16  Temp: 98.4 F (36.9 C)  SpO2: 90%  Weight: 130 lb 8 oz (59.2 kg)  Height: 5' (1.524 m)   Body mass index is 25.49 kg/m. Physical Exam  Constitutional: She is oriented to person, place, and time. She appears well-developed and well-nourished. No distress.  HENT:  Head: Normocephalic and atraumatic.  Right Ear: External ear normal.  Left Ear: External ear normal.  Nose: Nose normal.  Mouth/Throat: Oropharynx is clear and moist.  Mild hearing deficit  Eyes:  Corrective lenses  Neck: No JVD present. No tracheal deviation present. No thyromegaly present.  Cardiovascular: Normal rate, regular rhythm, normal heart sounds and intact distal pulses.  Exam reveals no gallop and no friction rub.   No murmur heard. Pulmonary/Chest: No respiratory distress. She has no wheezes. She has rales. She exhibits no tenderness.  Abdominal: She exhibits no distension and no mass. There is no tenderness.  Musculoskeletal: She exhibits edema and tenderness.  Unstable gait. Using cane. Right 4th finger contracture has been released. Left shoulder fx 11/06/14, decreased ROM and pain with movement. Chronic trace edema BLE, R>L Continue to ambulate with walker.  The left 3rd finger extension contracture.     Lymphadenopathy:    She has no cervical adenopathy.  Neurological: She is alert and oriented to person, place, and time. No cranial  nerve deficit. Coordination normal.  10/27/12 MMSE 28/30. Passed clock drawing.  Skin: No rash noted. No erythema. No pallor.  The right forehead contusion   Psychiatric: She has a normal mood and affect. Her behavior is normal. Judgment and thought content normal.    Labs reviewed: Basic Metabolic  Panel:  Recent Labs  02/28/15 04/20/15 04/24/15 07/04/15 08/07/15  NA 136* 138 142 138  --   K 4.1 5.5* 4.6 3.9  --   BUN 16 25* 24* 20  --   CREATININE 0.8 0.8 0.9 1.0  --   TSH 0.66  --   --   --  1.51   Liver Function Tests:  Recent Labs  02/28/15 04/20/15 07/04/15  AST 20 39* 18  ALT 25 18 12   ALKPHOS 56 58 62   No results for input(s): LIPASE, AMYLASE in the last 8760 hours. No results for input(s): AMMONIA in the last 8760 hours. CBC:  Recent Labs  02/28/15 04/20/15 07/04/15  WBC 18.6 8.6 14.7  HGB 14.0 14.2 12.9  HCT 44 44 39  PLT 282 262 272   Lipid Panel: No results for input(s): CHOL, HDL, LDLCALC, TRIG, CHOLHDL, LDLDIRECT in the last 8760 hours. No results found for: HGBA1C  Procedures: No results found.  Assessment/Plan GERD (gastroesophageal reflux disease) Stable, continue Omeprazole 20mg  daily. 12/07/15 wbc 9.0, Hgb 12.4, plt 274, Na 142, K 4.4, Bun 17, creat 0.98    Hypothyroid 12/07/15 TSH 1.35,  continue Synthroid 69mcg   Osteoarthritis Multiple sites, continue Meloxicam 7.5mg  since 09/21/15, Tylenol prn  Hypertonicity of bladder Managed with Myrbetriq, average bathroom trips 2x/night.    Memory loss Memory lapses. Continue AL for care needs   Edema Chronic trace dependent edema BLE, R>L, BNP 22.1 01/05/15. Continue Furosemide 40mg  po daily, 12/07/15 Na 142, K 4.4, Bun 17, creat 0.98   Situational depression seems better in her facial looks and conversation. Continue to observe. 12/26/15 pharm dc Mirtazapine.       Labs/tests ordered: CBC CMP TSH

## 2016-01-12 NOTE — Assessment & Plan Note (Signed)
Stable, continue Omeprazole 20mg  daily. 12/07/15 wbc 9.0, Hgb 12.4, plt 274, Na 142, K 4.4, Bun 17, creat 0.98

## 2016-01-12 NOTE — Assessment & Plan Note (Signed)
Managed with Myrbetriq, average bathroom trips 2x/night. 

## 2016-01-12 NOTE — Assessment & Plan Note (Signed)
Multiple sites, continue Meloxicam 7.5mg since 09/21/15, Tylenol prn 

## 2016-01-12 NOTE — Assessment & Plan Note (Addendum)
seems better in her facial looks and conversation. Continue to observe. 12/26/15 pharm dc Mirtazapine.

## 2016-01-15 DIAGNOSIS — I10 Essential (primary) hypertension: Secondary | ICD-10-CM | POA: Diagnosis not present

## 2016-01-15 DIAGNOSIS — D649 Anemia, unspecified: Secondary | ICD-10-CM | POA: Diagnosis not present

## 2016-01-15 LAB — CBC AND DIFFERENTIAL
HEMATOCRIT: 40 % (ref 36–46)
Hemoglobin: 13 g/dL (ref 12.0–16.0)
PLATELETS: 283 10*3/uL (ref 150–399)
WBC: 7.1 10*3/mL

## 2016-01-15 LAB — HEPATIC FUNCTION PANEL
ALK PHOS: 49 U/L (ref 25–125)
ALT: 14 U/L (ref 7–35)
AST: 17 U/L (ref 13–35)
BILIRUBIN, TOTAL: 0.5 mg/dL

## 2016-01-15 LAB — BASIC METABOLIC PANEL
BUN: 21 mg/dL (ref 4–21)
CREATININE: 1 mg/dL (ref <=1.1)
Glucose: 94 mg/dL
Potassium: 3.9 mmol/L (ref 3.4–5.3)
Sodium: 142 mmol/L (ref 137–147)

## 2016-01-15 LAB — TSH: TSH: 1.44 u[IU]/mL (ref <=5.90)

## 2016-01-16 ENCOUNTER — Other Ambulatory Visit: Payer: Self-pay | Admitting: *Deleted

## 2016-02-09 ENCOUNTER — Non-Acute Institutional Stay: Payer: Medicare Other | Admitting: Nurse Practitioner

## 2016-02-09 ENCOUNTER — Encounter: Payer: Self-pay | Admitting: Nurse Practitioner

## 2016-02-09 DIAGNOSIS — R413 Other amnesia: Secondary | ICD-10-CM | POA: Diagnosis not present

## 2016-02-09 DIAGNOSIS — F4321 Adjustment disorder with depressed mood: Secondary | ICD-10-CM

## 2016-02-09 DIAGNOSIS — E039 Hypothyroidism, unspecified: Secondary | ICD-10-CM | POA: Diagnosis not present

## 2016-02-09 DIAGNOSIS — R609 Edema, unspecified: Secondary | ICD-10-CM

## 2016-02-09 DIAGNOSIS — K219 Gastro-esophageal reflux disease without esophagitis: Secondary | ICD-10-CM

## 2016-02-09 DIAGNOSIS — N318 Other neuromuscular dysfunction of bladder: Secondary | ICD-10-CM

## 2016-02-09 DIAGNOSIS — J45909 Unspecified asthma, uncomplicated: Secondary | ICD-10-CM

## 2016-02-09 NOTE — Progress Notes (Signed)
Location:  [place] Room Number: 11 Place of Service:  ALF [phone]) Provider:  Mast, Manxie  NP  Jeanmarie Hubert, MD  Patient Care Team: [name], MD as PCP - General (Internal Medicine) Newton Pigg, MD as Consulting Physician (Obstetrics and Gynecology) Monna Fam, MD as Consulting Physician (Ophthalmology) Melida Quitter, MD as Consulting Physician (Otolaryngology) Myrlene Broker, MD as Consulting Physician (Urology) [place] Otho Darner, NP as Nurse Practitioner (Internal Medicine)  Extended Emergency Contact Information Primary Emergency Contact: Zanders,[name] Address: [place], [place] Phone: [phone] Work Phone: [phone] Mobile Phone: [phone] Relation: Son Secondary Emergency Contact: Teofilo Pod [place] of [place] Phone: [phone] Work Phone: [phone] Relation: Friend  Code Status: Full Code  Goals of care: Advanced Directive information Advanced Directives 02/09/2016  Does Patient Have a Medical Advance Directive? Yes  Type of Paramedic of [place];Living will  Does patient want to make changes to medical advance directive? No - Patient declined  Copy of [place] in Chart? Yes     Chief Complaint  Patient presents with  . Medical Management of Chronic Issues    HPI:  Pt is a 83 y.o. female seen today for medical management of chronic diseases.   Hx of depression, off Mirtazapine 7.5mg  12/26/15, relapsed, flat affect, not sleeps, poor appetite. hypothyroidism, takes Levothyroxine 72mcg daily, last TSH 1.35 12/07/15,average bathroom trips x2/night while on Myrbetriq 25mg  daily. GERD is asymptomatic while on Omeprazole. Left shoulder, healed fx, left fingers(left ring finger) fxs of the left shoulder and 4th finger released from contracture. BLE edema, trace, mainly in the left ankle, taking  Furosemide 40mg  daily     Past Medical History:  Diagnosis Date  . Asthma   . Carpal tunnel syndrome 03/07/2009  . Disturbance of skin sensation 02/21/2009  . Diverticulosis of colon (without mention of hemorrhage) 02/16/2009  . Fracture of upper end of left humerus 11/18/14  . 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 Hypothyroid   . Osteoarthrosis, unspecified whether generalized or localized, unspecified site 02/16/2009  . Other abnormal blood chemistry 06/19/2010   hyperglycemia  . Other and unspecified hyperlipidemia 10/24/2009  . Other atopic dermatitis and related conditions 02/16/2009  . Otosclerosis, unspecified 02/22/2003  . Pain in limb 12/05/2009  . Senile osteoporosis 06/16/2010  . Sensorineural hearing loss, unspecified 02/21/2009  . Syncope and collapse 05/30/2009  . Unspecified nasal polyp 02/21/2009  . Unspecified urinary incontinence 02/21/2009   Past Surgical History:  Procedure Laterality Date  . ABDOMINAL HYSTERECTOMY  1999   Dr. Collier Bullock  . BREAST BIOPSY  07/16/1990   benign left breast needle biopsy  Dr. Margot Chimes  . CARPAL TUNNEL RELEASE  04/07/2009   right Dr. Daylene Katayama  . CATARACT EXTRACTION W/ INTRAOCULAR LENS  IMPLANT, BILATERAL Bilateral   . TRIGGER FINGER RELEASE Left 08/09/2014   Procedure: RELEASE TRIGGER FINGER/A-1 PULLEY LEFT MIDDLE FINGER;  Surgeon: Daryll Brod, MD;  Location: [facility];  Service: Orthopedics;  Laterality: Left;  REGIONAL/FAB    Allergies  Allergen Reactions  . Avelox [Moxifloxacin Hcl In Nacl] Itching  . Doxycycline   . Hista-Tabs [Triprolidine-Pse] Other (See Comments)    Passed out  . Septra [Sulfamethoxazole-Trimethoprim]   . Sulfa Antibiotics     Itching/rash (all over)    Allergies as of 02/09/2016      Reactions  Avelox [moxifloxacin Hcl In Nacl] Itching   Doxycycline    Hista-tabs [triprolidine-pse] Other (See Comments)   Passed out   Septra [sulfamethoxazole-trimethoprim]    Sulfa Antibiotics    Itching/rash (all  over)      Medication List       Accurate as of 02/09/16  3:08 PM. Always use your most recent med list.          acetaminophen 325 MG tablet Commonly known as:  TYLENOL Take 2 tablets (650 mg total) by mouth every 6 (six) hours as needed for mild pain or moderate pain.   feeding supplement Liqd Take 1 Container by mouth 3 (three) times daily between meals.   fluticasone 50 MCG/ACT nasal spray Commonly known as:  FLONASE Place 2 sprays into both nostrils daily.   Fluticasone-Salmeterol 250-50 MCG/DOSE Aepb Commonly known as:  ADVAIR DISKUS Inhale once daily to help breathing   furosemide 40 MG tablet Commonly known as:  LASIX Take 40 mg by mouth every morning.   levothyroxine 25 MCG tablet Commonly known as:  SYNTHROID, LEVOTHROID Take 25 mcg by mouth daily before breakfast.   meloxicam 7.5 MG tablet Commonly known as:  MOBIC Take 7.5 mg by mouth daily.   montelukast 10 MG tablet Commonly known as:  SINGULAIR Take one tablet by mouth once daily to help breathing   multivitamin with minerals tablet Take 1 tablet by mouth daily. Take 1 tablet as a vitamin supplement.   MYRBETRIQ 25 MG Tb24 tablet Generic drug:  mirabegron ER TAKE ONE TABLET BY MOUTH ONCE DAILY   omeprazole 20 MG capsule Commonly known as:  PRILOSEC Take 20 mg by mouth daily. Take 1 tablet daily for heartburn or stomach reflux.   raloxifene 60 MG tablet Commonly known as:  EVISTA Take 1 tablet by mouth  daily to treat osteoporosis   VENTOLIN HFA 108 (90 Base) MCG/ACT inhaler Generic drug:  albuterol Inhale 2 puffs by mouth every 6 hours as needed for shortness of breath       Review of Systems  Constitutional: Negative for activity change, appetite change, fatigue, fever and unexpected weight change.  HENT: Positive for hearing loss. Negative for congestion and nosebleeds.   Respiratory: Positive for shortness of breath. Negative for cough, wheezing and stridor.   Cardiovascular:  Positive for leg swelling. Negative for chest pain and palpitations.       Trace BLE  Endocrine: Negative for polydipsia.  Genitourinary: Negative for dysuria, flank pain and frequency.  Musculoskeletal: Negative for myalgias.       Scar right palm from release of 4th finger contracture Left shoulder fx 11/06/14, in sling, f/u Ortho, pain with movement  Skin: Negative for pallor and rash.       The right forehead contusion  Neurological: Negative for dizziness, tremors, seizures, weakness and headaches.  Psychiatric/Behavioral: Positive for dysphoric mood.    Immunization History  Administered Date(s) Administered  . Influenza Whole 12/27/2011, 11/05/2012  . Influenza-Unspecified 11/18/2013, 11/03/2014, 11/16/2015  . PPD Test 04/04/2008, 11/23/2014  . Pneumococcal Polysaccharide-23 02/05/2008, 06/03/2008  . Td 02/05/2008, 06/03/2008   Pertinent  Health Maintenance Due  Topic Date Due  . DEXA SCAN  04/20/1988  . PNA vac Low Risk Adult (2 of 2 - PCV13) 06/03/2009  . INFLUENZA VACCINE  Completed   Fall Risk  05/30/2015 05/16/2015 04/25/2015 01/02/2015 09/06/2014  Falls in the past year? Yes No No Yes Yes  Number falls in past yr: 1 - - 1 1  Injury with  Fall? Yes - - Yes Yes  Risk Factor Category  - - - High Fall Risk -  Risk for fall due to : History of fall(s);Impaired balance/gait;Impaired mobility - - History of fall(s) -  Follow up Falls evaluation completed;Education provided - - Falls evaluation completed -   Functional Status Survey:    Vitals:   02/09/16 1336  BP: (!) 104/56  Pulse: 75  Resp: 16  Temp: 98.4 F (36.9 C)  SpO2: 93%  Weight: 129 lb 8 oz (58.7 kg)  Height: 5' (1.524 m)   Body mass index is 25.29 kg/m. Physical Exam  Constitutional: She is oriented to person, place, and time. She appears well-developed and well-nourished. No distress.  HENT:  Head: Normocephalic and atraumatic.  Right Ear: External ear normal.  Left Ear: External ear normal.  Nose:  Nose normal.  Mouth/Throat: Oropharynx is clear and moist.  Mild hearing deficit  Eyes:  Corrective lenses  Neck: No JVD present. No tracheal deviation present. No thyromegaly present.  Cardiovascular: Normal rate, regular rhythm, normal heart sounds and intact distal pulses.  Exam reveals no gallop and no friction rub.   No murmur heard. Pulmonary/Chest: No respiratory distress. She has no wheezes. She has rales. She exhibits no tenderness.  Abdominal: She exhibits no distension and no mass. There is no tenderness.  Musculoskeletal: She exhibits edema and tenderness.  Unstable gait. Using cane. Right 4th finger contracture has been released. Left shoulder fx 11/06/14, decreased ROM and pain with movement. Chronic trace edema BLE, R>L Continue to ambulate with walker.  The left 3rd finger extension contracture.     Lymphadenopathy:    She has no cervical adenopathy.  Neurological: She is alert and oriented to person, place, and time. No cranial nerve deficit. Coordination normal.  10/27/12 MMSE 28/30. Passed clock drawing.  Skin: No rash noted. No erythema. No pallor.  The right forehead contusion   Psychiatric: She has a normal mood and affect. Her behavior is normal. Judgment and thought content normal.    Labs reviewed:  Recent Labs  04/24/15 07/04/15 01/15/16  NA 142 138 142  K 4.6 3.9 3.9  BUN 24* 20 21  CREATININE 0.9 1.0 1.0    Recent Labs  04/20/15 07/04/15 01/15/16  AST 39* 18 17  ALT 18 12 14   ALKPHOS 58 62 49    Recent Labs  04/20/15 07/04/15 01/15/16  WBC 8.6 14.7 7.1  HGB 14.2 12.9 13.0  HCT 44 39 40  PLT 262 272 283   Lab Results  Component Value Date   TSH 1.44 01/15/2016   No results found for: HGBA1C Lab Results  Component Value Date   CHOL 211 (A) 02/14/2014   HDL 47 02/14/2014   LDLCALC 133 02/14/2014   TRIG 155 02/14/2014    Significant Diagnostic Results in last 30 days:  No results found.  Assessment/Plan Situational  depression Relapsed flat affect. Continue to observe. 12/26/15 pharm dc Mirtazapine.    Intrinsic asthma Stable, chronic hacking cough, continue Advair, Flonase, Singulair, and Ventolin prn.   GERD (gastroesophageal reflux disease) Stable, continue Omeprazole 20mg  daily. 12/07/15 wbc 9.0, Hgb 12.4, plt 274, Na 142, K 4.4, Bun 17, creat 0.98    Hypothyroid 12/07/15 TSH 1.35,  continue Synthroid 47mcg   Hypertonicity of bladder Managed with Myrbetriq, average bathroom trips 2x/night.    Memory loss Memory lapses. Continue AL for care needs   Edema Chronic trace dependent edema BLE, R>L, BNP 22.1 01/05/15. Continue Furosemide 40mg  po daily,  01/15/16 Na 142, K 3.9, bun 21, creat 1.01       Family/ staff Communication: AL  Labs/tests ordered:  none

## 2016-02-09 NOTE — Assessment & Plan Note (Signed)
Stable, chronic hacking cough, continue Advair, Flonase, Singulair, and Ventolin prn 

## 2016-02-09 NOTE — Assessment & Plan Note (Signed)
Relapsed flat affect. Continue to observe. 12/26/15 pharm dc Mirtazapine.

## 2016-02-09 NOTE — Assessment & Plan Note (Signed)
Managed with Myrbetriq, average bathroom trips 2x/night. 

## 2016-02-09 NOTE — Assessment & Plan Note (Signed)
Stable, continue Omeprazole 20mg  daily. 12/07/15 wbc 9.0, Hgb 12.4, plt 274, Na 142, K 4.4, Bun 17, creat 0.98

## 2016-02-09 NOTE — Assessment & Plan Note (Addendum)
Chronic trace dependent edema BLE, R>L, BNP 22.1 01/05/15. Continue Furosemide 40mg  po daily, 01/15/16 Na 142, K 3.9, bun 21, creat 1.01

## 2016-02-09 NOTE — Assessment & Plan Note (Signed)
12/07/15 TSH 1.35,  continue Synthroid 33mcg

## 2016-02-09 NOTE — Assessment & Plan Note (Signed)
Memory lapses. Continue AL for care needs 

## 2016-03-11 ENCOUNTER — Non-Acute Institutional Stay: Payer: Medicare Other | Admitting: Internal Medicine

## 2016-03-11 ENCOUNTER — Encounter: Payer: Self-pay | Admitting: Internal Medicine

## 2016-03-11 DIAGNOSIS — E039 Hypothyroidism, unspecified: Secondary | ICD-10-CM

## 2016-03-11 DIAGNOSIS — R300 Dysuria: Secondary | ICD-10-CM | POA: Diagnosis not present

## 2016-03-11 DIAGNOSIS — R413 Other amnesia: Secondary | ICD-10-CM

## 2016-03-11 DIAGNOSIS — R609 Edema, unspecified: Secondary | ICD-10-CM | POA: Diagnosis not present

## 2016-03-11 DIAGNOSIS — N3946 Mixed incontinence: Secondary | ICD-10-CM | POA: Diagnosis not present

## 2016-03-11 NOTE — Progress Notes (Signed)
Progress Note    Location:  [facility] Room Number: [place] of Service:  ALF [phone]) Provider: Jeanmarie Hubert, MD  Patient Care Team: [name], MD as PCP - General (Internal Medicine) Newton Pigg, MD as Consulting Physician (Obstetrics and Gynecology) Monna Fam, MD as Consulting Physician (Ophthalmology) Melida Quitter, MD as Consulting Physician (Otolaryngology) Myrlene Broker, MD as Consulting Physician (Urology) [place] Otho Darner, NP as Nurse Practitioner (Internal Medicine)  Extended Emergency Contact Information Primary Emergency Contact: Karges,Jacinta Penalver Address: [place], [place] Phone: [phone] Work Phone: [phone] Mobile Phone: [phone] Relation: Son Secondary Emergency Contact: Teofilo Pod States of [place] Phone: [phone] Work Phone: [phone] Relation: Friend  Code Status:  Full Code Goals of care: Advanced Directive information Advanced Directives 03/11/2016  Does Patient Have a Medical Advance Directive? Yes  Type of Paramedic of [place];Living will  Does patient want to make changes to medical advance directive? -  Copy of [place] in Chart? Yes     Chief Complaint  Patient presents with  . Medical Management of Chronic Issues    routine visit    HPI:  Pt is a 83 y.o. female seen today for medical management of chronic diseases.    Memory loss - unchanged  Dysuria - new complaint  Hypothyroidism, unspecified type - compensated  Edema, unspecified type - unchanged  Mixed stress and urge urinary incontinence - episodically     Past Medical History:  Diagnosis Date  . Asthma   . Carpal tunnel syndrome 03/07/2009  . Disturbance of skin sensation 02/21/2009  . Diverticulosis of colon (without mention of hemorrhage) 02/16/2009  . Fracture of upper end of left humerus  11/18/14  . 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 Hypothyroid   . Osteoarthrosis, unspecified whether generalized or localized, unspecified site 02/16/2009  . Other abnormal blood chemistry 06/19/2010   hyperglycemia  . Other and unspecified hyperlipidemia 10/24/2009  . Other atopic dermatitis and related conditions 02/16/2009  . Otosclerosis, unspecified 02/22/2003  . Pain in limb 12/05/2009  . Senile osteoporosis 06/16/2010  . Sensorineural hearing loss, unspecified 02/21/2009  . Syncope and collapse 05/30/2009  . Unspecified nasal polyp 02/21/2009  . Unspecified urinary incontinence 02/21/2009   Past Surgical History:  Procedure Laterality Date  . ABDOMINAL HYSTERECTOMY  1999   Dr. Collier Bullock  . BREAST BIOPSY  07/16/1990   benign left breast needle biopsy  Dr. Margot Chimes  . CARPAL TUNNEL RELEASE  04/07/2009   right Dr. Daylene Katayama  . CATARACT EXTRACTION W/ INTRAOCULAR LENS  IMPLANT, BILATERAL Bilateral   . TRIGGER FINGER RELEASE Left 08/09/2014   Procedure: RELEASE TRIGGER FINGER/A-1 PULLEY LEFT MIDDLE FINGER;  Surgeon: Daryll Brod, MD;  Location: [facility];  Service: Orthopedics;  Laterality: Left;  REGIONAL/FAB    Allergies  Allergen Reactions  . Avelox [Moxifloxacin Hcl In Nacl] Itching  . Doxycycline   . Hista-Tabs [Triprolidine-Pse] Other (See Comments)    Passed out  . Septra [Sulfamethoxazole-Trimethoprim]   . Sulfa Antibiotics     Itching/rash (all over)    Allergies as of 03/11/2016      Reactions   Avelox [moxifloxacin Hcl In Nacl] Itching   Doxycycline    Hista-tabs [triprolidine-pse] Other (See Comments)   Passed out   Septra [sulfamethoxazole-trimethoprim]    Sulfa Antibiotics    Itching/rash (  all over)      Medication List       Accurate as of 03/11/16  9:50 AM. Always use your most recent med list.          acetaminophen 325 MG tablet Commonly known as:  TYLENOL Take 2 tablets (650 mg total) by mouth every 6 (six) hours as needed for mild pain or moderate  pain.   feeding supplement Liqd Take 1 Container by mouth 3 (three) times daily between meals.   fluticasone 50 MCG/ACT nasal spray Commonly known as:  FLONASE Place 2 sprays into both nostrils daily.   Fluticasone-Salmeterol 250-50 MCG/DOSE Aepb Commonly known as:  ADVAIR DISKUS Inhale once daily to help breathing   furosemide 40 MG tablet Commonly known as:  LASIX Take 40 mg by mouth every morning.   levothyroxine 25 MCG tablet Commonly known as:  SYNTHROID, LEVOTHROID Take 25 mcg by mouth daily before breakfast.   meloxicam 7.5 MG tablet Commonly known as:  MOBIC Take 7.5 mg by mouth daily.   mirtazapine 7.5 MG tablet Commonly known as:  REMERON Take 7.5 mg by mouth at bedtime.   montelukast 10 MG tablet Commonly known as:  SINGULAIR Take one tablet by mouth once daily to help breathing   MYRBETRIQ 25 MG Tb24 tablet Generic drug:  mirabegron ER TAKE ONE TABLET BY MOUTH ONCE DAILY   omeprazole 20 MG capsule Commonly known as:  PRILOSEC Take 20 mg by mouth daily. Take 1 tablet daily for heartburn or stomach reflux.   raloxifene 60 MG tablet Commonly known as:  EVISTA Take 1 tablet by mouth  daily to treat osteoporosis   VENTOLIN HFA 108 (90 Base) MCG/ACT inhaler Generic drug:  albuterol Inhale 2 puffs by mouth every 6 hours as needed for shortness of breath       Review of Systems  Constitutional: Negative for activity change, appetite change, fatigue, fever and unexpected weight change.  HENT: Positive for hearing loss. Negative for congestion and nosebleeds.   Respiratory: Positive for shortness of breath. Negative for cough, wheezing and stridor.   Cardiovascular: Positive for leg swelling. Negative for chest pain and palpitations.       Trace BLE  Endocrine: Negative for polydipsia.  Genitourinary: Positive for dysuria and frequency. Negative for flank pain.       Episodes of incontinence  Musculoskeletal: Negative for myalgias.       Scar right palm  from release of 4th finger contracture Left shoulder fx 11/06/14, in sling, f/u Ortho, pain with movement  Skin: Negative for pallor and rash.       The right forehead contusion  Neurological: Negative for dizziness, tremors, seizures, weakness and headaches.  Psychiatric/Behavioral: Positive for dysphoric mood.    Immunization History  Administered Date(s) Administered  . Influenza Whole 12/27/2011, 11/05/2012  . Influenza-Unspecified 11/18/2013, 11/03/2014, 11/16/2015  . PPD Test 04/04/2008, 11/23/2014  . Pneumococcal Polysaccharide-23 02/05/2008, 06/03/2008  . Td 02/05/2008, 06/03/2008   Pertinent  Health Maintenance Due  Topic Date Due  . DEXA SCAN  04/20/1988  . PNA vac Low Risk Adult (2 of 2 - PCV13) 06/03/2009  . INFLUENZA VACCINE  Completed   Fall Risk  05/30/2015 05/16/2015 04/25/2015 01/02/2015 09/06/2014  Falls in the past year? Yes No No Yes Yes  Number falls in past yr: 1 - - 1 1  Injury with Fall? Yes - - Yes Yes  Risk Factor Category  - - - High Fall Risk -  Risk for fall due to :  History of fall(s);Impaired balance/gait;Impaired mobility - - History of fall(s) -  Follow up Falls evaluation completed;Education provided - - Falls evaluation completed -     Vitals:   03/11/16 0943  BP: 111/68  Pulse: 65  Resp: 18  Temp: 98.3 F (36.8 C)  SpO2: 96%  Weight: 129 lb (58.5 kg)  Height: 4\' 11"  (1.499 m)   Body mass index is 26.05 kg/m. Physical Exam  Constitutional: She is oriented to person, place, and time. She appears well-developed and well-nourished. No distress.  HENT:  Head: Normocephalic and atraumatic.  Right Ear: External ear normal.  Left Ear: External ear normal.  Nose: Nose normal.  Mouth/Throat: Oropharynx is clear and moist.  Mild hearing deficit  Eyes:  Corrective lenses  Neck: No JVD present. No tracheal deviation present. No thyromegaly present.  Cardiovascular: Normal rate, regular rhythm, normal heart sounds and intact distal pulses.   Exam reveals no gallop and no friction rub.   No murmur heard. Pulmonary/Chest: No respiratory distress. She has no wheezes. She has rales. She exhibits no tenderness.  Abdominal: She exhibits no distension and no mass. There is no tenderness.  Musculoskeletal: She exhibits edema and tenderness.  Unstable gait. Using cane. Right 4th finger contracture has been released. Left shoulder fx 11/06/14, decreased ROM and pain with movement. Chronic trace edema BLE, R>L Continue to ambulate with walker.  The left 3rd finger extension contracture.   Lymphadenopathy:    She has no cervical adenopathy.  Neurological: She is alert and oriented to person, place, and time. No cranial nerve deficit. Coordination normal.  10/27/12 MMSE 28/30. Passed clock drawing.  Skin: No rash noted. No erythema. No pallor.  Psychiatric: She has a normal mood and affect. Her behavior is normal. Judgment and thought content normal.    Labs reviewed:  Recent Labs  04/24/15 07/04/15 01/15/16  NA 142 138 142  K 4.6 3.9 3.9  BUN 24* 20 21  CREATININE 0.9 1.0 1.0    Recent Labs  04/20/15 07/04/15 01/15/16  AST 39* 18 17  ALT 18 12 14   ALKPHOS 58 62 49    Recent Labs  04/20/15 07/04/15 01/15/16  WBC 8.6 14.7 7.1  HGB 14.2 12.9 13.0  HCT 44 39 40  PLT 262 272 283   Lab Results  Component Value Date   TSH 1.44 01/15/2016   No results found for: HGBA1C Lab Results  Component Value Date   CHOL 211 (A) 02/14/2014   HDL 47 02/14/2014   LDLCALC 133 02/14/2014   TRIG 155 02/14/2014    Assessment/Plan  1. Dysuria -UA and culture  2. Memory loss -unchanged  3. Hypothyroidism, unspecified type compensated  4. Edema, unspecified type unchanged  5. Mixed stress and urge urinary incontinence May be related in part to recurrent UTI -UA and culture

## 2016-03-12 DIAGNOSIS — N390 Urinary tract infection, site not specified: Secondary | ICD-10-CM | POA: Diagnosis not present

## 2016-03-13 DIAGNOSIS — N390 Urinary tract infection, site not specified: Secondary | ICD-10-CM | POA: Diagnosis not present

## 2016-03-22 ENCOUNTER — Encounter: Payer: Self-pay | Admitting: Nurse Practitioner

## 2016-03-22 ENCOUNTER — Non-Acute Institutional Stay: Payer: Medicare Other | Admitting: Nurse Practitioner

## 2016-03-22 DIAGNOSIS — E039 Hypothyroidism, unspecified: Secondary | ICD-10-CM | POA: Diagnosis not present

## 2016-03-22 DIAGNOSIS — R609 Edema, unspecified: Secondary | ICD-10-CM | POA: Diagnosis not present

## 2016-03-22 DIAGNOSIS — N318 Other neuromuscular dysfunction of bladder: Secondary | ICD-10-CM | POA: Diagnosis not present

## 2016-03-22 DIAGNOSIS — J45909 Unspecified asthma, uncomplicated: Secondary | ICD-10-CM

## 2016-03-22 DIAGNOSIS — R300 Dysuria: Secondary | ICD-10-CM | POA: Diagnosis not present

## 2016-03-22 DIAGNOSIS — M159 Polyosteoarthritis, unspecified: Secondary | ICD-10-CM

## 2016-03-22 DIAGNOSIS — F4321 Adjustment disorder with depressed mood: Secondary | ICD-10-CM

## 2016-03-22 DIAGNOSIS — K219 Gastro-esophageal reflux disease without esophagitis: Secondary | ICD-10-CM

## 2016-03-22 DIAGNOSIS — M150 Primary generalized (osteo)arthritis: Secondary | ICD-10-CM | POA: Diagnosis not present

## 2016-03-22 DIAGNOSIS — R413 Other amnesia: Secondary | ICD-10-CM | POA: Diagnosis not present

## 2016-03-22 NOTE — Assessment & Plan Note (Signed)
Memory lapses. Continue AL for care needs 

## 2016-03-22 NOTE — Assessment & Plan Note (Signed)
Stable, chronic hacking cough, continue Advair, Flonase, Singulair, and Ventolin prn 

## 2016-03-22 NOTE — Assessment & Plan Note (Signed)
Multiple sites, continue Meloxicam 7.5mg since 09/21/15, Tylenol prn 

## 2016-03-22 NOTE — Assessment & Plan Note (Signed)
continue Synthroid 48mcg

## 2016-03-22 NOTE — Assessment & Plan Note (Signed)
Noted excoriated urogenital area, no suprapubic or CVA tenderness, denied nausea, vomiting, constipation, or diarrhea, she is afebrile. Negative UA for UTI, failed Pyridium. Will apply Mycology II bid to affected area bid x 2 weeks. Observe.

## 2016-03-22 NOTE — Assessment & Plan Note (Signed)
Chronic trace dependent edema BLE, R>L, BNP 22.1 01/05/15. Continue Furosemide 40mg  po daily, 01/15/16 Na 142, K 3.9, bun 21, creat 1.01

## 2016-03-22 NOTE — Assessment & Plan Note (Addendum)
Relapsed flat affect. Continue to observe. 12/26/15 pharm dc Mirtazapine. 02/09/16 resume Mirtazapine 7.5mg  hs

## 2016-03-22 NOTE — Progress Notes (Signed)
Location:  [place] [facility] Room Number: AL 11 Place of Service:  ALF [phone]) Provider:  Giancarlos Berendt, Manxie  NP  Jeanmarie Hubert, MD  Patient Care Team: [name], MD as PCP - General (Internal Medicine) Newton Pigg, MD as Consulting Physician (Obstetrics and Gynecology) Monna Fam, MD as Consulting Physician (Ophthalmology) Melida Quitter, MD as Consulting Physician (Otolaryngology) Myrlene Broker, MD as Consulting Physician (Urology) [place] Boniface Otho Darner, NP as Nurse Practitioner (Internal Medicine)  Extended Emergency Contact Information Primary Emergency Contact: Griffeth,[name] Address: [place], [place] of [place] Phone: [phone] Work Phone: [phone] Mobile Phone: [phone] Relation: Son Secondary Emergency Contact: Teofilo Pod States of [place] Phone: [phone] Work Phone: [phone] Relation: Friend  Code Status: Full Code  Goals of care: Advanced Directive information Advanced Directives 03/22/2016  Does Patient Have a Medical Advance Directive? Yes  Type of Paramedic of [place];Living will  Does patient want to make changes to medical advance directive? No - Patient declined  Copy of [place] in Chart? Yes     Chief Complaint  Patient presents with  . Acute Visit    dysuria    HPI:  Pt is a 83 y.o. female seen today for dysuria, failed Pyridium, negative UA, noted excoriated urogenital area upon my exam today, denied suprapubic or CVA tenderness, she is afebrile,m no noted nausea, vomiting, consti  Hx of depression, resumed Mirtazapine 7.5mg , off since 12/26/15,  Has relapsed, flat affect, not sleeps, poor appetite, now improved,  hypothyroidism, takes Levothyroxine 36mcg daily, last TSH 1.35 12/07/15,average bathroom trips x2/night while on Myrbetriq 25mg  daily. GERD is asymptomatic while on Omeprazole. Left  shoulder, healed fx, left fingers(left ring finger) fxs of the left shoulder and 4th finger released from contracture, pain is managed with Mobic 7.5mg  daily, BLE edema, trace, mainly in the left ankle, taking Furosemide 40mg  daily     Past Medical History:  Diagnosis Date  . Asthma   . Carpal tunnel syndrome 03/07/2009  . Disturbance of skin sensation 02/21/2009  . Diverticulosis of colon (without mention of hemorrhage) 02/16/2009  . Fracture of upper end of left humerus 11/18/14  . 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 Hypothyroid   . Osteoarthrosis, unspecified whether generalized or localized, unspecified site 02/16/2009  . Other abnormal blood chemistry 06/19/2010   hyperglycemia  . Other and unspecified hyperlipidemia 10/24/2009  . Other atopic dermatitis and related conditions 02/16/2009  . Otosclerosis, unspecified 02/22/2003  . Pain in limb 12/05/2009  . Senile osteoporosis 06/16/2010  . Sensorineural hearing loss, unspecified 02/21/2009  . Syncope and collapse 05/30/2009  . Unspecified nasal polyp 02/21/2009  . Unspecified urinary incontinence 02/21/2009   Past Surgical History:  Procedure Laterality Date  . ABDOMINAL HYSTERECTOMY  1999   Dr. Collier Bullock  . BREAST BIOPSY  07/16/1990   benign left breast needle biopsy  Dr. Margot Chimes  . CARPAL TUNNEL RELEASE  04/07/2009   right Dr. Daylene Katayama  . CATARACT EXTRACTION W/ INTRAOCULAR LENS  IMPLANT, BILATERAL Bilateral   . TRIGGER FINGER RELEASE Left 08/09/2014   Procedure: RELEASE TRIGGER FINGER/A-1 PULLEY LEFT MIDDLE FINGER;  Surgeon: Daryll Brod, MD;  Location: [facility];  Service: Orthopedics;  Laterality: Left;  REGIONAL/FAB    Allergies  Allergen Reactions  . Avelox [Moxifloxacin Hcl In Nacl] Itching  . Doxycycline   . Hista-Tabs [Triprolidine-Pse] Other (See  Comments)    Passed out  . Septra [Sulfamethoxazole-Trimethoprim]   . Sulfa Antibiotics     Itching/rash (all over)    Allergies as of 03/22/2016      Reactions    Avelox [moxifloxacin Hcl In Nacl] Itching   Doxycycline    Hista-tabs [triprolidine-pse] Other (See Comments)   Passed out   Septra [sulfamethoxazole-trimethoprim]    Sulfa Antibiotics    Itching/rash (all over)      Medication List       Accurate as of 03/22/16  4:36 PM. Always use your most recent med list.          acetaminophen 325 MG tablet Commonly known as:  TYLENOL Take 2 tablets (650 mg total) by mouth every 6 (six) hours as needed for mild pain or moderate pain.   feeding supplement Liqd Take 1 Container by mouth 3 (three) times daily between meals.   fluticasone 50 MCG/ACT nasal spray Commonly known as:  FLONASE Place 2 sprays into both nostrils daily.   Fluticasone-Salmeterol 250-50 MCG/DOSE Aepb Commonly known as:  ADVAIR DISKUS Inhale once daily to help breathing   furosemide 40 MG tablet Commonly known as:  LASIX Take 40 mg by mouth every morning.   levothyroxine 25 MCG tablet Commonly known as:  SYNTHROID, LEVOTHROID Take 25 mcg by mouth daily before breakfast.   meloxicam 7.5 MG tablet Commonly known as:  MOBIC Take 7.5 mg by mouth daily.   mirtazapine 7.5 MG tablet Commonly known as:  REMERON Take 7.5 mg by mouth at bedtime.   montelukast 10 MG tablet Commonly known as:  SINGULAIR Take one tablet by mouth once daily to help breathing   MYRBETRIQ 25 MG Tb24 tablet Generic drug:  mirabegron ER TAKE ONE TABLET BY MOUTH ONCE DAILY   omeprazole 20 MG capsule Commonly known as:  PRILOSEC Take 20 mg by mouth daily. Take 1 tablet daily for heartburn or stomach reflux.   raloxifene 60 MG tablet Commonly known as:  EVISTA Take 1 tablet by mouth  daily to treat osteoporosis   VENTOLIN HFA 108 (90 Base) MCG/ACT inhaler Generic drug:  albuterol Inhale 2 puffs by mouth every 6 hours as needed for shortness of breath       Review of Systems  Constitutional: Negative for activity change, appetite change, fatigue, fever and unexpected weight  change.  HENT: Positive for hearing loss. Negative for congestion and nosebleeds.   Respiratory: Positive for shortness of breath. Negative for cough, wheezing and stridor.   Cardiovascular: Positive for leg swelling. Negative for chest pain and palpitations.       Trace BLE  Endocrine: Negative for polydipsia.  Genitourinary: Positive for dysuria. Negative for flank pain and frequency.  Musculoskeletal: Negative for myalgias.       Scar right palm from release of 4th finger contracture Left shoulder fx 11/06/14, in sling, f/u Ortho, pain with movement  Skin: Negative for pallor and rash.       Urogenital area excoriation.   Neurological: Negative for dizziness, tremors, seizures, weakness and headaches.  Psychiatric/Behavioral: Positive for dysphoric mood.    Immunization History  Administered Date(s) Administered  . Influenza Whole 12/27/2011, 11/05/2012  . Influenza-Unspecified 11/18/2013, 11/03/2014, 11/16/2015  . PPD Test 04/04/2008, 11/23/2014  . Pneumococcal Polysaccharide-23 02/05/2008, 06/03/2008  . Td 02/05/2008, 06/03/2008   Pertinent  Health Maintenance Due  Topic Date Due  . DEXA SCAN  04/20/1988  . PNA vac Low Risk Adult (2 of 2 - PCV13) 06/03/2009  . INFLUENZA  VACCINE  Completed   Fall Risk  05/30/2015 05/16/2015 04/25/2015 01/02/2015 09/06/2014  Falls in the past year? Yes No No Yes Yes  Number falls in past yr: 1 - - 1 1  Injury with Fall? Yes - - Yes Yes  Risk Factor Category  - - - High Fall Risk -  Risk for fall due to : History of fall(s);Impaired balance/gait;Impaired mobility - - History of fall(s) -  Follow up Falls evaluation completed;Education provided - - Falls evaluation completed -   Functional Status Survey:    Vitals:   03/22/16 1608  BP: 113/63  Pulse: 64  Resp: 18  Temp: 98.4 F (36.9 C)  SpO2: 90%  Weight: 130 lb (59 kg)  Height: 4\' 11"  (1.499 m)   Body mass index is 26.26 kg/m. Physical Exam  Constitutional: She is oriented to  person, place, and time. She appears well-developed and well-nourished. No distress.  HENT:  Head: Normocephalic and atraumatic.  Right Ear: External ear normal.  Left Ear: External ear normal.  Nose: Nose normal.  Mouth/Throat: Oropharynx is clear and moist.  Mild hearing deficit  Eyes:  Corrective lenses  Neck: No JVD present. No tracheal deviation present. No thyromegaly present.  Cardiovascular: Normal rate, regular rhythm, normal heart sounds and intact distal pulses.  Exam reveals no gallop and no friction rub.   No murmur heard. Pulmonary/Chest: No respiratory distress. She has no wheezes. She has rales. She exhibits no tenderness.  Abdominal: She exhibits no distension and no mass. There is no tenderness.  Musculoskeletal: She exhibits edema and tenderness.  Unstable gait. Using cane. Right 4th finger contracture has been released. Left shoulder fx 11/06/14, decreased ROM and pain with movement. Chronic trace edema BLE, R>L Continue to ambulate with walker.  The left 3rd finger extension contracture.     Lymphadenopathy:    She has no cervical adenopathy.  Neurological: She is alert and oriented to person, place, and time. No cranial nerve deficit. Coordination normal.  10/27/12 MMSE 28/30. Passed clock drawing.  Skin: No rash noted. No erythema. No pallor.     Psychiatric: She has a normal mood and affect. Her behavior is normal. Judgment and thought content normal.    Labs reviewed:  Recent Labs  04/24/15 07/04/15 01/15/16  NA 142 138 142  K 4.6 3.9 3.9  BUN 24* 20 21  CREATININE 0.9 1.0 1.0    Recent Labs  04/20/15 07/04/15 01/15/16  AST 39* 18 17  ALT 18 12 14   ALKPHOS 58 62 49    Recent Labs  04/20/15 07/04/15 01/15/16  WBC 8.6 14.7 7.1  HGB 14.2 12.9 13.0  HCT 44 39 40  PLT 262 272 283   Lab Results  Component Value Date   TSH 1.44 01/15/2016   No results found for: HGBA1C Lab Results  Component Value Date   CHOL 211 (A) 02/14/2014   HDL 47  02/14/2014   LDLCALC 133 02/14/2014   TRIG 155 02/14/2014    Significant Diagnostic Results in last 30 days:  No results found.  Assessment/Plan Intrinsic asthma Stable, chronic hacking cough, continue Advair, Flonase, Singulair, and Ventolin prn.    GERD (gastroesophageal reflux disease) Stable, continue Omeprazole 20mg  daily  Hypothyroid  continue Synthroid 57mcg   Osteoarthritis Multiple sites, continue Meloxicam 7.5mg  since 09/21/15, Tylenol prn  Hypertonicity of bladder Managed with Myrbetriq, average bathroom trips 2x/night.     Memory loss Memory lapses. Continue AL for care needs   Edema Chronic trace dependent  edema BLE, R>L, BNP 22.1 01/05/15. Continue Furosemide 40mg  po daily, 01/15/16 Na 142, K 3.9, bun 21, creat 1.01   Situational depression Relapsed flat affect. Continue to observe. 12/26/15 pharm dc Mirtazapine. 02/09/16 resume Mirtazapine 7.5mg  hs  Dysuria Noted excoriated urogenital area, no suprapubic or CVA tenderness, denied nausea, vomiting, constipation, or diarrhea, she is afebrile. Negative UA for UTI, failed Pyridium. Will apply Mycology II bid to affected area bid x 2 weeks. Observe.      Family/ staff Communication: AL  Labs/tests ordered:  none

## 2016-03-22 NOTE — Assessment & Plan Note (Signed)
Managed with Myrbetriq, average bathroom trips 2x/night. 

## 2016-03-22 NOTE — Assessment & Plan Note (Signed)
Stable, continue Omeprazole 20mg daily.  

## 2016-05-10 ENCOUNTER — Non-Acute Institutional Stay: Payer: Medicare Other | Admitting: Nurse Practitioner

## 2016-05-10 ENCOUNTER — Encounter: Payer: Self-pay | Admitting: Nurse Practitioner

## 2016-05-10 DIAGNOSIS — K219 Gastro-esophageal reflux disease without esophagitis: Secondary | ICD-10-CM | POA: Diagnosis not present

## 2016-05-10 DIAGNOSIS — R627 Adult failure to thrive: Secondary | ICD-10-CM | POA: Insufficient documentation

## 2016-05-10 DIAGNOSIS — F4321 Adjustment disorder with depressed mood: Secondary | ICD-10-CM | POA: Diagnosis not present

## 2016-05-10 DIAGNOSIS — J45909 Unspecified asthma, uncomplicated: Secondary | ICD-10-CM

## 2016-05-10 DIAGNOSIS — R609 Edema, unspecified: Secondary | ICD-10-CM | POA: Diagnosis not present

## 2016-05-10 DIAGNOSIS — M150 Primary generalized (osteo)arthritis: Secondary | ICD-10-CM | POA: Diagnosis not present

## 2016-05-10 DIAGNOSIS — R634 Abnormal weight loss: Secondary | ICD-10-CM

## 2016-05-10 DIAGNOSIS — M159 Polyosteoarthritis, unspecified: Secondary | ICD-10-CM

## 2016-05-10 DIAGNOSIS — E039 Hypothyroidism, unspecified: Secondary | ICD-10-CM | POA: Diagnosis not present

## 2016-05-10 DIAGNOSIS — N318 Other neuromuscular dysfunction of bladder: Secondary | ICD-10-CM | POA: Diagnosis not present

## 2016-05-10 DIAGNOSIS — R413 Other amnesia: Secondary | ICD-10-CM

## 2016-05-10 NOTE — Assessment & Plan Note (Signed)
Multiple sites, continue Meloxicam 7.5mg  since 09/21/15, Tylenol prn

## 2016-05-10 NOTE — Assessment & Plan Note (Signed)
Stable, continue Omeprazole 20mg daily.  

## 2016-05-10 NOTE — Assessment & Plan Note (Signed)
Wt # 126.5Ibs 05/2016, Wt # 130.5Ibs 04/2016. Dietary supplement started, she admitted her poor appetite, but declined appetite stimulant. She stated sleeps well, denied abd pain, indigestion, constipation, nausea, vomiting, diarrhea.  Update CBC CMP TSH Hgb a1c, UA C/S, observe the patient.

## 2016-05-10 NOTE — Progress Notes (Signed)
Location:  [facility] Room Number: 11 Place of Service:  ALF [phone]) Provider:  Nichola Warren, Manxie  NP  Jeanmarie Hubert, MD  Patient Care Team: [name], MD as PCP - General (Internal Medicine) Newton Pigg, MD as Consulting Physician (Obstetrics and Gynecology) Monna Fam, MD as Consulting Physician (Ophthalmology) Melida Quitter, MD as Consulting Physician (Otolaryngology) Myrlene Broker, MD as Consulting Physician (Urology) Alden Shajuana Mclucas Otho Darner, NP as Nurse Practitioner (Internal Medicine)  Extended Emergency Contact Information Primary Emergency Contact: Rugg,[name] Address: [place], [place] Phone: [phone] Work Phone: [phone] Mobile Phone: [phone] Relation: Son Secondary Emergency Contact: Teofilo Pod [place] of [place] Phone: [phone] Work Phone: [phone] Relation: Friend  Code Status: Full Code  Goals of care: Advanced Directive information Advanced Directives 05/10/2016  Does Patient Have a Medical Advance Directive? Yes  Type of Paramedic of [place];Living will  Does patient want to make changes to medical advance directive? No - Patient declined  Copy of [place] in Chart? Yes     Chief Complaint  Patient presents with  . Acute Visit    Poor appetite    HPI:  Pt is a 83 y.o. female seen today for Wt # 126.5Ibs 05/2016, Wt # 130.5Ibs 04/2016. Dietary supplement started, she admitted her poor appetite, but declined appetite stimulant. She stated sleeps well, denied abd pain, indigestion, constipation, nausea, vomiting, diarrhea.    Hx of depression, resumed Mirtazapine 7.5mg , off since 01/15/16 TSH 1.44, stated sleeps well at night, hypothyroidism, takes Levothyroxine 108mcg daily, last TSH 1.35 12/07/15,average bathroom trips x2/night while on Myrbetriq 25mg  daily. GERD is asymptomatic while  on Omeprazole. Left shoulder, healed fx, left fingers(left ring finger) fxs of the left shoulder and 4th finger released from contracture, pain is managed with Mobic 7.5mg  daily, BLE edema, trace, mainly in the left ankle, taking Furosemide 40mg  daily     Past Medical History:  Diagnosis Date  . Asthma   . Carpal tunnel syndrome 03/07/2009  . Disturbance of skin sensation 02/21/2009  . Diverticulosis of colon (without mention of hemorrhage) 02/16/2009  . Fracture of upper end of left humerus 11/18/14  . 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 Hypothyroid   . Osteoarthrosis, unspecified whether generalized or localized, unspecified site 02/16/2009  . Other abnormal blood chemistry 06/19/2010   hyperglycemia  . Other and unspecified hyperlipidemia 10/24/2009  . Other atopic dermatitis and related conditions 02/16/2009  . Otosclerosis, unspecified 02/22/2003  . Pain in limb 12/05/2009  . Senile osteoporosis 06/16/2010  . Sensorineural hearing loss, unspecified 02/21/2009  . Syncope and collapse 05/30/2009  . Unspecified nasal polyp 02/21/2009  . Unspecified urinary incontinence 02/21/2009   Past Surgical History:  Procedure Laterality Date  . ABDOMINAL HYSTERECTOMY  1999   Dr. Collier Bullock  . BREAST BIOPSY  07/16/1990   benign left breast needle biopsy  Dr. Margot Chimes  . CARPAL TUNNEL RELEASE  04/07/2009   right Dr. Daylene Katayama  . CATARACT EXTRACTION W/ INTRAOCULAR LENS  IMPLANT, BILATERAL Bilateral   . TRIGGER FINGER RELEASE Left 08/09/2014   Procedure: RELEASE TRIGGER FINGER/A-1 PULLEY LEFT MIDDLE FINGER;  Surgeon: Daryll Brod, MD;  Location: [facility];  Service: Orthopedics;  Laterality: Left;  REGIONAL/FAB    Allergies  Allergen Reactions  . Avelox [Moxifloxacin Hcl In Nacl] Itching  . Doxycycline   . Hista-Tabs [  Triprolidine-Pse] Other (See Comments)    Passed out  . Septra [Sulfamethoxazole-Trimethoprim]   . Sulfa Antibiotics     Itching/rash (all over)    Allergies as of 05/10/2016        Reactions   Avelox [moxifloxacin Hcl In Nacl] Itching   Doxycycline    Hista-tabs [triprolidine-pse] Other (See Comments)   Passed out   Septra [sulfamethoxazole-trimethoprim]    Sulfa Antibiotics    Itching/rash (all over)      Medication List       Accurate as of 05/10/16  1:39 PM. Always use your most recent med list.          acetaminophen 325 MG tablet Commonly known as:  TYLENOL Take 2 tablets (650 mg total) by mouth every 6 (six) hours as needed for mild pain or moderate pain.   feeding supplement Liqd Take 1 Container by mouth 3 (three) times daily between meals.   fluticasone 50 MCG/ACT nasal spray Commonly known as:  FLONASE Place 2 sprays into both nostrils daily.   Fluticasone-Salmeterol 250-50 MCG/DOSE Aepb Commonly known as:  ADVAIR DISKUS Inhale once daily to help breathing   furosemide 40 MG tablet Commonly known as:  LASIX Take 40 mg by mouth every morning.   levothyroxine 25 MCG tablet Commonly known as:  SYNTHROID, LEVOTHROID Take 25 mcg by mouth daily before breakfast.   meloxicam 7.5 MG tablet Commonly known as:  MOBIC Take 7.5 mg by mouth daily.   mirtazapine 7.5 MG tablet Commonly known as:  REMERON Take 7.5 mg by mouth at bedtime.   montelukast 10 MG tablet Commonly known as:  SINGULAIR Take one tablet by mouth once daily to help breathing   MYRBETRIQ 25 MG Tb24 tablet Generic drug:  mirabegron ER TAKE ONE TABLET BY MOUTH ONCE DAILY   omeprazole 20 MG capsule Commonly known as:  PRILOSEC Take 20 mg by mouth daily. Take 1 tablet daily for heartburn or stomach reflux.   raloxifene 60 MG tablet Commonly known as:  EVISTA Take 1 tablet by mouth  daily to treat osteoporosis   VENTOLIN HFA 108 (90 Base) MCG/ACT inhaler Generic drug:  albuterol Inhale 2 puffs by mouth every 6 hours as needed for shortness of breath       Review of Systems  Constitutional: Positive for appetite change and unexpected weight change. Negative  for activity change, fatigue and fever.       Wt # 126.5Ibs 05/2016 Wt # 130.5Ibs 04/2016  HENT: Positive for hearing loss. Negative for congestion and nosebleeds.   Respiratory: Positive for shortness of breath. Negative for cough, wheezing and stridor.   Cardiovascular: Positive for leg swelling. Negative for chest pain and palpitations.       Trace BLE  Endocrine: Negative for polydipsia.  Genitourinary: Positive for dysuria. Negative for flank pain and frequency.  Musculoskeletal: Negative for myalgias.       Scar right palm from release of 4th finger contracture Left shoulder fx 11/06/14, in sling, f/u Ortho, pain with movement  Skin: Negative for pallor and rash.       Urogenital area excoriation.   Neurological: Negative for dizziness, tremors, seizures, weakness and headaches.  Psychiatric/Behavioral: Positive for dysphoric mood.    Immunization History  Administered Date(s) Administered  . Influenza Whole 12/27/2011, 11/05/2012  . Influenza-Unspecified 11/18/2013, 11/03/2014, 11/16/2015  . PPD Test 04/04/2008, 11/23/2014  . Pneumococcal Polysaccharide-23 02/05/2008, 06/03/2008  . Td 02/05/2008, 06/03/2008   Pertinent  Health Maintenance Due  Topic Date Due  .  DEXA SCAN  04/20/1988  . PNA vac Low Risk Adult (2 of 2 - PCV13) 06/03/2009  . INFLUENZA VACCINE  09/04/2016   Fall Risk  05/30/2015 05/16/2015 04/25/2015 01/02/2015 09/06/2014  Falls in the past year? Yes No No Yes Yes  Number falls in past yr: 1 - - 1 1  Injury with Fall? Yes - - Yes Yes  Risk Factor Category  - - - High Fall Risk -  Risk for fall due to : History of fall(s);Impaired balance/gait;Impaired mobility - - History of fall(s) -  Follow up Falls evaluation completed;Education provided - - Falls evaluation completed -   Functional Status Survey:    Vitals:   05/10/16 1308  BP: (!) 94/56  Pulse: 66  Resp: 18  Temp: 97.8 F (36.6 C)  SpO2: 90%  Weight: 127 lb (57.6 kg)  Height: 4\' 11"  (1.499 m)    Body mass index is 25.65 kg/m. Physical Exam  Constitutional: She is oriented to person, place, and time. She appears well-developed and well-nourished. No distress.  HENT:  Head: Normocephalic and atraumatic.  Right Ear: External ear normal.  Left Ear: External ear normal.  Nose: Nose normal.  Mouth/Throat: Oropharynx is clear and moist.  Mild hearing deficit  Eyes:  Corrective lenses  Neck: No JVD present. No tracheal deviation present. No thyromegaly present.  Cardiovascular: Normal rate, regular rhythm, normal heart sounds and intact distal pulses.  Exam reveals no gallop and no friction rub.   No murmur heard. Pulmonary/Chest: No respiratory distress. She has no wheezes. She has rales. She exhibits no tenderness.  Abdominal: She exhibits no distension and no mass. There is no tenderness.  Musculoskeletal: She exhibits edema and tenderness.  Unstable gait. Using cane. Right 4th finger contracture has been released. Left shoulder fx 11/06/14, decreased ROM and pain with movement. Chronic trace edema BLE, R>L Continue to ambulate with walker.  The left 3rd finger extension contracture.     Lymphadenopathy:    She has no cervical adenopathy.  Neurological: She is alert and oriented to person, place, and time. No cranial nerve deficit. Coordination normal.  10/27/12 MMSE 28/30. Passed clock drawing.  Skin: No rash noted. No erythema. No pallor.     Psychiatric: She has a normal mood and affect. Her behavior is normal. Judgment and thought content normal.    Labs reviewed:  Recent Labs  07/04/15 01/15/16  NA 138 142  K 3.9 3.9  BUN 20 21  CREATININE 1.0 1.0    Recent Labs  07/04/15 01/15/16  AST 18 17  ALT 12 14  ALKPHOS 62 49    Recent Labs  07/04/15 01/15/16  WBC 14.7 7.1  HGB 12.9 13.0  HCT 39 40  PLT 272 283   Lab Results  Component Value Date   TSH 1.44 01/15/2016   No results found for: HGBA1C Lab Results  Component Value Date   CHOL 211 (A)  02/14/2014   HDL 47 02/14/2014   LDLCALC 133 02/14/2014   TRIG 155 02/14/2014    Significant Diagnostic Results in last 30 days:  No results found.  Assessment/Plan Weight loss Wt # 126.5Ibs 05/2016, Wt # 130.5Ibs 04/2016. Dietary supplement started, she admitted her poor appetite, but declined appetite stimulant. She stated sleeps well, denied abd pain, indigestion, constipation, nausea, vomiting, diarrhea.  Update CBC CMP TSH Hgb a1c, UA C/S, observe the patient.   Intrinsic asthma Stable, chronic hacking cough, continue Advair, Flonase, Singulair, and Ventolin prn  GERD (gastroesophageal reflux disease)  Stable, continue Omeprazole 20mg  daily  Hypothyroid  continue Synthroid 20mcg, last TSH 1.44 01/15/16, update TSH  Osteoarthritis Multiple sites, continue Meloxicam 7.5mg  since 09/21/15, Tylenol prn  Hypertonicity of bladder Managed with Myrbetriq, average bathroom trips 2x/night.  Memory loss Memory lapses. Continue AL for care needs  Edema Chronic trace dependent edema BLE, R>L, BNP 22.1 01/05/15. Continue Furosemide 40mg  po daily, update CMP  Situational depression Relapsed flat affect. Continue to observe. 12/26/15 pharm dc Mirtazapine. 02/09/16 resume Mirtazapine 7.5mg  hs 05/10/16 sleeps well at night, persisted flat affect, poor appetite-declined meds. Obtain Hgb a1c     Family/ staff Communication: AL  Labs/tests ordered:  CBC CMP TSH Hgb a1c UA C/S

## 2016-05-10 NOTE — Assessment & Plan Note (Signed)
continue Synthroid 41mcg, last TSH 1.44 01/15/16, update TSH

## 2016-05-10 NOTE — Assessment & Plan Note (Signed)
Stable, chronic hacking cough, continue Advair, Flonase, Singulair, and Ventolin prn

## 2016-05-10 NOTE — Assessment & Plan Note (Addendum)
Chronic trace dependent edema BLE, R>L, BNP 22.1 01/05/15. Continue Furosemide 40mg  po daily, update CMP

## 2016-05-10 NOTE — Assessment & Plan Note (Addendum)
Relapsed flat affect. Continue to observe. 12/26/15 pharm dc Mirtazapine. 02/09/16 resume Mirtazapine 7.5mg  hs 05/10/16 sleeps well at night, persisted flat affect, poor appetite-declined meds. Obtain Hgb a1c

## 2016-05-10 NOTE — Assessment & Plan Note (Signed)
Memory lapses. Continue AL for care needs

## 2016-05-10 NOTE — Assessment & Plan Note (Signed)
Managed with Myrbetriq, average bathroom trips 2x/night.

## 2016-05-13 DIAGNOSIS — E785 Hyperlipidemia, unspecified: Secondary | ICD-10-CM | POA: Diagnosis not present

## 2016-05-13 DIAGNOSIS — E039 Hypothyroidism, unspecified: Secondary | ICD-10-CM | POA: Diagnosis not present

## 2016-05-13 DIAGNOSIS — J329 Chronic sinusitis, unspecified: Secondary | ICD-10-CM | POA: Diagnosis not present

## 2016-05-13 DIAGNOSIS — M810 Age-related osteoporosis without current pathological fracture: Secondary | ICD-10-CM | POA: Diagnosis not present

## 2016-05-13 DIAGNOSIS — M6281 Muscle weakness (generalized): Secondary | ICD-10-CM | POA: Diagnosis not present

## 2016-05-13 DIAGNOSIS — J45909 Unspecified asthma, uncomplicated: Secondary | ICD-10-CM | POA: Diagnosis not present

## 2016-05-13 LAB — HEPATIC FUNCTION PANEL
ALK PHOS: 46 U/L (ref 25–125)
ALT: 16 U/L (ref 7–35)
AST: 19 U/L (ref 13–35)
Bilirubin, Total: 0.3 mg/dL

## 2016-05-13 LAB — BASIC METABOLIC PANEL
BUN: 16 mg/dL (ref 4–21)
CREATININE: 0.9 mg/dL (ref 0.5–1.1)
Glucose: 88 mg/dL
POTASSIUM: 3.9 mmol/L (ref 3.4–5.3)
SODIUM: 140 mmol/L (ref 137–147)

## 2016-05-13 LAB — CBC AND DIFFERENTIAL
HCT: 38 % (ref 36–46)
Hemoglobin: 12.4 g/dL (ref 12.0–16.0)
Platelets: 312 10*3/uL (ref 150–399)
WBC: 6.5 10*3/mL

## 2016-05-13 LAB — HEMOGLOBIN A1C: Hemoglobin A1C: 6.1

## 2016-05-13 LAB — TSH: TSH: 0.95 u[IU]/mL (ref 0.41–5.90)

## 2016-05-14 ENCOUNTER — Other Ambulatory Visit: Payer: Self-pay

## 2016-05-14 DIAGNOSIS — N390 Urinary tract infection, site not specified: Secondary | ICD-10-CM | POA: Diagnosis not present

## 2016-06-11 ENCOUNTER — Encounter: Payer: Self-pay | Admitting: Nurse Practitioner

## 2016-06-11 ENCOUNTER — Non-Acute Institutional Stay: Payer: Medicare Other | Admitting: Nurse Practitioner

## 2016-06-11 DIAGNOSIS — K219 Gastro-esophageal reflux disease without esophagitis: Secondary | ICD-10-CM | POA: Diagnosis not present

## 2016-06-11 DIAGNOSIS — R609 Edema, unspecified: Secondary | ICD-10-CM | POA: Diagnosis not present

## 2016-06-11 DIAGNOSIS — R413 Other amnesia: Secondary | ICD-10-CM | POA: Diagnosis not present

## 2016-06-11 DIAGNOSIS — E039 Hypothyroidism, unspecified: Secondary | ICD-10-CM

## 2016-06-11 DIAGNOSIS — F4321 Adjustment disorder with depressed mood: Secondary | ICD-10-CM

## 2016-06-11 DIAGNOSIS — R627 Adult failure to thrive: Secondary | ICD-10-CM

## 2016-06-11 DIAGNOSIS — N318 Other neuromuscular dysfunction of bladder: Secondary | ICD-10-CM | POA: Diagnosis not present

## 2016-06-11 DIAGNOSIS — M159 Polyosteoarthritis, unspecified: Secondary | ICD-10-CM

## 2016-06-11 DIAGNOSIS — M150 Primary generalized (osteo)arthritis: Secondary | ICD-10-CM | POA: Diagnosis not present

## 2016-06-11 NOTE — Assessment & Plan Note (Signed)
02/09/16 resume Mirtazapine 7.5mg  hs 05/10/16 sleeps well at night, persisted flat affect, poor appetite-declined med 06/11/16 no significant improvement, increase Mirtazapine 15mg  qd, observe.

## 2016-06-11 NOTE — Progress Notes (Signed)
Location:  [facility] Room Number: 11 Place of Service:  ALF [id number]) Provider:  Mast, Manxie  NP  [name], MD  Patient Care Team: [name], MD as PCP - General (Internal Medicine) Newton Pigg, MD as Consulting Physician (Obstetrics and Gynecology) Monna Fam, MD as Consulting Physician (Ophthalmology) Melida Quitter, MD as Consulting Physician (Otolaryngology) Myrlene Broker, MD as Consulting Physician (Urology) Melina Modena, [facility] Mast, Man X, NP as Nurse Practitioner (Internal Medicine)  Extended Emergency Contact Information Primary Emergency Contact: Lindo,[name] Address: [place], [place] Phone: [phone] Work Phone: [phone] Mobile Phone: [phone] Relation: Son Secondary Emergency Contact: Teofilo Pod [place] of [place] Phone: [phone] Work Phone: [phone] Relation: Friend  Code Status: Full Code  Goals of care: Advanced Directive information Advanced Directives 06/11/2016  Does Patient Have a Medical Advance Directive? Yes  Type of Paramedic of [place];Living will  Does patient want to make changes to medical advance directive? No - Patient declined  Copy of [place] in Chart? Yes     Chief Complaint  Patient presents with  . Acute Visit    weakness, needing assistance with ADL's,     HPI:  Pt is a 83 y.o. female seen today for persisted adult FTT, poor appetite, generalized weakness, ADL dependency, unremarkable   Hx of depression, resumed Mirtazapine 7.5mg , no significant improvement, TSH 0.95 05/13/16,  hypothyroidism, takes Levothyroxine 59mcg daily, average bathroom trips x2/night while on Myrbetriq 25mg  daily. GERD is asymptomatic while on Omeprazole. Left shoulder, healed fx, left fingers(left ring finger) fxs of the left shoulder and 4th finger released from contracture, pain is  managed with Mobic 7.5mg  daily, BLE edema, trace, mainly in the left ankle, taking Furosemide 40mg  daily     Past Medical History:  Diagnosis Date  . Asthma   . Carpal tunnel syndrome 03/07/2009  . Disturbance of skin sensation 02/21/2009  . Diverticulosis of colon (without mention of hemorrhage) 02/16/2009  . Fracture of upper end of left humerus 11/18/14  . 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 Hypothyroid   . Osteoarthrosis, unspecified whether generalized or localized, unspecified site 02/16/2009  . Other abnormal blood chemistry 06/19/2010   hyperglycemia  . Other and unspecified hyperlipidemia 10/24/2009  . Other atopic dermatitis and related conditions 02/16/2009  . Otosclerosis, unspecified 02/22/2003  . Pain in limb 12/05/2009  . Senile osteoporosis 06/16/2010  . Sensorineural hearing loss, unspecified 02/21/2009  . Syncope and collapse 05/30/2009  . Unspecified nasal polyp 02/21/2009  . Unspecified urinary incontinence 02/21/2009   Past Surgical History:  Procedure Laterality Date  . ABDOMINAL HYSTERECTOMY  1999   Dr. Collier Bullock  . BREAST BIOPSY  07/16/1990   benign left breast needle biopsy  Dr. Margot Chimes  . CARPAL TUNNEL RELEASE  04/07/2009   right Dr. Daylene Katayama  . CATARACT EXTRACTION W/ INTRAOCULAR LENS  IMPLANT, BILATERAL Bilateral   . TRIGGER FINGER RELEASE Left 08/09/2014   Procedure: RELEASE TRIGGER FINGER/A-1 PULLEY LEFT MIDDLE FINGER;  Surgeon: Daryll Brod, MD;  Location: [facility];  Service: Orthopedics;  Laterality: Left;  REGIONAL/FAB    Allergies  Allergen Reactions  . Avelox [Moxifloxacin Hcl In Nacl] Itching  . Doxycycline   . Hista-Tabs [Triprolidine-Pse] Other (See Comments)    Passed out  . Septra [Sulfamethoxazole-Trimethoprim]   . Sulfa Antibiotics     Itching/rash (all  over)    Allergies as of 06/11/2016      Reactions   Avelox [moxifloxacin Hcl In Nacl] Itching   Doxycycline    Hista-tabs [triprolidine-pse] Other (See Comments)   Passed out    Septra [sulfamethoxazole-trimethoprim]    Sulfa Antibiotics    Itching/rash (all over)      Medication List       Accurate as of 06/11/16  2:34 PM. Always use your most recent med list.          acetaminophen 325 MG tablet Commonly known as:  TYLENOL Take 2 tablets (650 mg total) by mouth every 6 (six) hours as needed for mild pain or moderate pain.   feeding supplement Liqd Take 1 Container by mouth 3 (three) times daily between meals.   fluticasone 50 MCG/ACT nasal spray Commonly known as:  FLONASE Place 2 sprays into both nostrils daily.   Fluticasone-Salmeterol 250-50 MCG/DOSE Aepb Commonly known as:  ADVAIR DISKUS Inhale once daily to help breathing   furosemide 40 MG tablet Commonly known as:  LASIX Take 40 mg by mouth every morning.   levothyroxine 25 MCG tablet Commonly known as:  SYNTHROID, LEVOTHROID Take 25 mcg by mouth daily before breakfast.   meloxicam 7.5 MG tablet Commonly known as:  MOBIC Take 7.5 mg by mouth daily.   mirtazapine 7.5 MG tablet Commonly known as:  REMERON Take 7.5 mg by mouth at bedtime.   montelukast 10 MG tablet Commonly known as:  SINGULAIR Take one tablet by mouth once daily to help breathing   MYRBETRIQ 25 MG Tb24 tablet Generic drug:  mirabegron ER TAKE ONE TABLET BY MOUTH ONCE DAILY   omeprazole 20 MG capsule Commonly known as:  PRILOSEC Take 20 mg by mouth daily. Take 1 tablet daily for heartburn or stomach reflux.   raloxifene 60 MG tablet Commonly known as:  EVISTA Take 1 tablet by mouth  daily to treat osteoporosis   VENTOLIN HFA 108 (90 Base) MCG/ACT inhaler Generic drug:  albuterol Inhale 2 puffs by mouth every 6 hours as needed for shortness of breath       Review of Systems  Constitutional: Positive for appetite change and unexpected weight change. Negative for activity change, fatigue and fever.       Wt # 126.5Ibs 05/2016 Wt # 130.5Ibs 04/2016  HENT: Positive for hearing loss. Negative for congestion  and nosebleeds.   Respiratory: Positive for shortness of breath. Negative for cough, wheezing and stridor.   Cardiovascular: Positive for leg swelling. Negative for chest pain and palpitations.       Trace BLE  Endocrine: Negative for polydipsia.  Genitourinary: Positive for dysuria. Negative for flank pain and frequency.  Musculoskeletal: Negative for myalgias.       Scar right palm from release of 4th finger contracture Left shoulder fx 11/06/14, in sling, f/u Ortho, pain with movement  Skin: Negative for pallor and rash.       Urogenital area excoriation.   Neurological: Negative for dizziness, tremors, seizures, weakness and headaches.  Psychiatric/Behavioral: Positive for dysphoric mood.    Immunization History  Administered Date(s) Administered  . Influenza Whole 12/27/2011, 11/05/2012  . Influenza-Unspecified 11/18/2013, 11/03/2014, 11/16/2015  . PPD Test 04/04/2008, 11/23/2014  . Pneumococcal Polysaccharide-23 02/05/2008, 06/03/2008  . Td 02/05/2008, 06/03/2008   Pertinent  Health Maintenance Due  Topic Date Due  . DEXA SCAN  04/20/1988  . PNA vac Low Risk Adult (2 of 2 - PCV13) 06/03/2009  . INFLUENZA VACCINE  09/04/2016  Fall Risk  05/30/2015 05/16/2015 04/25/2015 01/02/2015 09/06/2014  Falls in the past year? Yes No No Yes Yes  Number falls in past yr: 1 - - 1 1  Injury with Fall? Yes - - Yes Yes  Risk Factor Category  - - - High Fall Risk -  Risk for fall due to : History of fall(s);Impaired balance/gait;Impaired mobility - - History of fall(s) -  Follow up Falls evaluation completed;Education provided - - Falls evaluation completed -   Functional Status Survey:    Vitals:   06/11/16 1423  BP: 113/70  Pulse: 68  Resp: 16  Temp: 97.4 F (36.3 C)  SpO2: 94%  Weight: 124 lb (56.2 kg)  Height: 4\' 11"  (1.499 m)   Body mass index is 25.04 kg/m. Physical Exam  Constitutional: She is oriented to person, place, and time. She appears well-developed and  well-nourished. No distress.  HENT:  Head: Normocephalic and atraumatic.  Right Ear: External ear normal.  Left Ear: External ear normal.  Nose: Nose normal.  Mouth/Throat: Oropharynx is clear and moist.  Mild hearing deficit  Eyes:  Corrective lenses  Neck: No JVD present. No tracheal deviation present. No thyromegaly present.  Cardiovascular: Normal rate, regular rhythm, normal heart sounds and intact distal pulses.  Exam reveals no gallop and no friction rub.   No murmur heard. Pulmonary/Chest: No respiratory distress. She has no wheezes. She has rales. She exhibits no tenderness.  Abdominal: She exhibits no distension and no mass. There is no tenderness.  Musculoskeletal: She exhibits edema and tenderness.  Unstable gait. Using cane. Right 4th finger contracture has been released. Left shoulder fx 11/06/14, decreased ROM and pain with movement. Chronic trace edema BLE, R>L Continue to ambulate with walker.  The left 3rd finger extension contracture.     Lymphadenopathy:    She has no cervical adenopathy.  Neurological: She is alert and oriented to person, place, and time. No cranial nerve deficit. Coordination normal.  10/27/12 MMSE 28/30. Passed clock drawing.  Skin: No rash noted. No erythema. No pallor.     Psychiatric: She has a normal mood and affect. Her behavior is normal. Judgment and thought content normal.    Labs reviewed:  Recent Labs  07/04/15 01/15/16 05/13/16  NA 138 142 140  K 3.9 3.9 3.9  BUN 20 21 16   CREATININE 1.0 1.0 0.9    Recent Labs  07/04/15 01/15/16 05/13/16  AST 18 17 19   ALT 12 14 16   ALKPHOS 62 49 46    Recent Labs  07/04/15 01/15/16 05/13/16  WBC 14.7 7.1 6.5  HGB 12.9 13.0 12.4  HCT 39 40 38  PLT 272 283 312   Lab Results  Component Value Date   TSH 0.95 05/13/2016   Lab Results  Component Value Date   HGBA1C 6.1 05/13/2016   Lab Results  Component Value Date   CHOL 211 (A) 02/14/2014   HDL 47 02/14/2014   LDLCALC  133 02/14/2014   TRIG 155 02/14/2014    Significant Diagnostic Results in last 30 days:  No results found.  Assessment/Plan: Hypothyroid 05/13/16 Na 140, K 3.9, Bun 16, creat 0.9, wbc 6.5, Hgb 12.4, plt 312, TSH 0.95, Hgb a1c 6.1 continue Synthroid 59mcg   Adult failure to thrive 05/13/16 Na 140, K 3.9, Bun 16, creat 0.9, wbc 6.5, Hgb 12.4, plt 312, TSH 0.95, Hgb a1c 6.1 SNF placement if indicated.  Increase Mirtazapine 15mg , observe.   GERD (gastroesophageal reflux disease) Stable, continue Omeprazole 20mg  daily  Osteoarthritis Multiple sites, continue Meloxicam 7.5mg  since 09/21/15, Tylenol prn  Hypertonicity of bladder Managed with Myrbetriq, average bathroom trips 2x/night.  Memory loss Memory lapses. Supervision needed.   Edema Chronic trace dependent edema BLE, R>L, BNP 22.1 01/05/15. Continue Furosemide 40mg  po daily  Situational depression 02/09/16 resume Mirtazapine 7.5mg  hs 05/10/16 sleeps well at night, persisted flat affect, poor appetite-declined med 06/11/16 no significant improvement, increase Mirtazapine 15mg  qd, observe.       Family/ staff Communication: may consider SNF if needed.   Labs/tests ordered: none

## 2016-06-11 NOTE — Assessment & Plan Note (Signed)
05/13/16 Na 140, K 3.9, Bun 16, creat 0.9, wbc 6.5, Hgb 12.4, plt 312, TSH 0.95, Hgb a1c 6.1 continue Synthroid 65mcg

## 2016-06-11 NOTE — Assessment & Plan Note (Signed)
Managed with Myrbetriq, average bathroom trips 2x/night.

## 2016-06-11 NOTE — Assessment & Plan Note (Signed)
Chronic trace dependent edema BLE, R>L, BNP 22.1 01/05/15. Continue Furosemide 40mg  po daily

## 2016-06-11 NOTE — Assessment & Plan Note (Signed)
05/13/16 Na 140, K 3.9, Bun 16, creat 0.9, wbc 6.5, Hgb 12.4, plt 312, TSH 0.95, Hgb a1c 6.1 SNF placement if indicated.  Increase Mirtazapine 15mg , observe.

## 2016-06-11 NOTE — Assessment & Plan Note (Signed)
Stable, continue Omeprazole 20mg daily.  

## 2016-06-11 NOTE — Assessment & Plan Note (Signed)
Memory lapses. Supervision needed.

## 2016-06-11 NOTE — Assessment & Plan Note (Signed)
Multiple sites, continue Meloxicam 7.5mg  since 09/21/15, Tylenol prn

## 2016-06-21 DIAGNOSIS — F332 Major depressive disorder, recurrent severe without psychotic features: Secondary | ICD-10-CM | POA: Diagnosis not present

## 2016-07-11 ENCOUNTER — Non-Acute Institutional Stay: Payer: Medicare Other | Admitting: Family

## 2016-07-11 ENCOUNTER — Encounter: Payer: Self-pay | Admitting: Family

## 2016-07-11 DIAGNOSIS — R600 Localized edema: Secondary | ICD-10-CM | POA: Diagnosis not present

## 2016-07-11 DIAGNOSIS — R5383 Other fatigue: Secondary | ICD-10-CM

## 2016-07-14 NOTE — Progress Notes (Addendum)
Location:  [facility] Room Number: 11 Place of Service:  ALF [phone]) Provider: Trenton Passow FNP-C  Blanchie Serve, MD  Patient Care Team: Blanchie Serve, MD as PCP - General (Internal Medicine) Newton Pigg, MD as Consulting Physician (Obstetrics and Gynecology) Monna Fam, MD as Consulting Physician (Ophthalmology) Melida Quitter, MD as Consulting Physician (Otolaryngology) Myrlene Broker, MD as Consulting Physician (Urology) Melina Modena, [facility], Man X, NP as Nurse Practitioner (Internal Medicine)  Extended Emergency Contact Information Primary Emergency Contact: Akkerman,[name] Address: [place], [place] Phone: [phone] Work Phone: [phone] Mobile Phone: [phone] Relation: Son Secondary Emergency Contact: Teofilo Pod [place] of [place] Phone: [phone] Work Phone: [phone] Relation: Friend  Code Status: Full Code  Goals of care: Advanced Directive information Advanced Directives 07/11/2016  Does Patient Have a Medical Advance Directive? No;Yes  Type of Paramedic of [place];Living will  Does patient want to make changes to medical advance directive? -  Copy of [place] in Chart? Yes     Chief Complaint  Patient presents with  . Acute Visit    Fatigue (wonders about needing K supplement with lasix)    HPI:  Pt is a 83 y.o. female seen today at [facility] for an acute visit for evaluation of of tiredness. She has a significant medical history of  Hypothyroidism, Asthma, depression,OA among other conditions.She is seen in her room today per facility Nurse request. Facility Nurse reports patient feeling tired all the time. Also states patient currently on Furosemide wonders about adding potassium supplement. Patient denies any fever,chills, cough or symptoms of worsening depression.    Past Medical  History:  Diagnosis Date  . Asthma   . Carpal tunnel syndrome 03/07/2009  . Disturbance of skin sensation 02/21/2009  . Diverticulosis of colon (without mention of hemorrhage) 02/16/2009  . Fracture of upper end of left humerus 11/18/14  . 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 Hypothyroid   . Osteoarthrosis, unspecified whether generalized or localized, unspecified site 02/16/2009  . Other abnormal blood chemistry 06/19/2010   hyperglycemia  . Other and unspecified hyperlipidemia 10/24/2009  . Other atopic dermatitis and related conditions 02/16/2009  . Otosclerosis, unspecified 02/22/2003  . Pain in limb 12/05/2009  . Senile osteoporosis 06/16/2010  . Sensorineural hearing loss, unspecified 02/21/2009  . Syncope and collapse 05/30/2009  . Unspecified nasal polyp 02/21/2009  . Unspecified urinary incontinence 02/21/2009   Past Surgical History:  Procedure Laterality Date  . ABDOMINAL HYSTERECTOMY  1999   Dr. Collier Bullock  . BREAST BIOPSY  07/16/1990   benign left breast needle biopsy  Dr. Margot Chimes  . CARPAL TUNNEL RELEASE  04/07/2009   right Dr. Daylene Katayama  . CATARACT EXTRACTION W/ INTRAOCULAR LENS  IMPLANT, BILATERAL Bilateral   . TRIGGER FINGER RELEASE Left 08/09/2014   Procedure: RELEASE TRIGGER FINGER/A-1 PULLEY LEFT MIDDLE FINGER;  Surgeon: Daryll Brod, MD;  Location: [facility];  Service: Orthopedics;  Laterality: Left;  REGIONAL/FAB    Allergies  Allergen Reactions  . Avelox [Moxifloxacin Hcl In Nacl] Itching  . Doxycycline   . Hista-Tabs [Triprolidine-Pse] Other (See Comments)    Passed out  . Septra [Sulfamethoxazole-Trimethoprim]   . Sulfa Antibiotics     Itching/rash (all over)    Allergies as of 07/11/2016      Reactions   Avelox [moxifloxacin Hcl In Nacl] Itching  Doxycycline    Hista-tabs [triprolidine-pse] Other (See Comments)   Passed out   Septra [sulfamethoxazole-trimethoprim]    Sulfa Antibiotics    Itching/rash (all over)      Medication List         Accurate as of 07/11/16 11:59 PM. Always use your most recent med list.          acetaminophen 325 MG tablet Commonly known as:  TYLENOL Take 2 tablets (650 mg total) by mouth every 6 (six) hours as needed for mild pain or moderate pain.   feeding supplement Liqd Take 1 Container by mouth 2 (two) times daily between meals.   fluticasone 50 MCG/ACT nasal spray Commonly known as:  FLONASE Place 2 sprays into both nostrils daily.   Fluticasone-Salmeterol 250-50 MCG/DOSE Aepb Commonly known as:  ADVAIR DISKUS Inhale once daily to help breathing   furosemide 40 MG tablet Commonly known as:  LASIX Take 40 mg by mouth every morning.   levothyroxine 25 MCG tablet Commonly known as:  SYNTHROID, LEVOTHROID Take 25 mcg by mouth daily before breakfast.   meloxicam 7.5 MG tablet Commonly known as:  MOBIC Take 7.5 mg by mouth daily.   mirtazapine 7.5 MG tablet Commonly known as:  REMERON Take 7.5 mg by mouth at bedtime.   montelukast 10 MG tablet Commonly known as:  SINGULAIR Take one tablet by mouth once daily to help breathing   MYRBETRIQ 25 MG Tb24 tablet Generic drug:  mirabegron ER TAKE ONE TABLET BY MOUTH ONCE DAILY   omeprazole 20 MG capsule Commonly known as:  PRILOSEC Take 20 mg by mouth daily. Take 1 tablet daily for heartburn or stomach reflux.   raloxifene 60 MG tablet Commonly known as:  EVISTA Take 1 tablet by mouth  daily to treat osteoporosis   sertraline 50 MG tablet Commonly known as:  ZOLOFT Take 50 mg by mouth daily.   VENTOLIN HFA 108 (90 Base) MCG/ACT inhaler Generic drug:  albuterol Inhale 2 puffs by mouth every 6 hours as needed for shortness of breath   ZINC OXIDE EX Apply 1 application topically 2 (two) times daily as needed (incontinence episodes causing redness).       Review of Systems  Constitutional: Positive for fatigue. Negative for activity change, appetite change, chills and fever.  HENT: Positive for hearing loss. Negative for  congestion, rhinorrhea, sinus pain, sinus pressure, sneezing and sore throat.   Eyes: Negative for discharge, redness and itching.  Respiratory: Negative for cough, chest tightness, shortness of breath and wheezing.   Cardiovascular: Positive for leg swelling. Negative for chest pain and palpitations.  Gastrointestinal: Negative for abdominal distention, abdominal pain, constipation, diarrhea, nausea and vomiting.  Endocrine: Negative.   Genitourinary: Negative for dysuria, flank pain, frequency and urgency.  Musculoskeletal: Positive for arthralgias and gait problem.  Skin: Negative for color change, pallor, rash and wound.  Neurological: Negative for dizziness, seizures, syncope, light-headedness and headaches.  Hematological: Does not bruise/bleed easily.  Psychiatric/Behavioral: Negative for agitation, confusion, hallucinations and sleep disturbance. The patient is not nervous/anxious.     Immunization History  Administered Date(s) Administered  . Influenza Whole 12/27/2011, 11/05/2012  . Influenza-Unspecified 11/18/2013, 11/03/2014, 11/16/2015  . PPD Test 04/04/2008, 11/23/2014  . Pneumococcal Polysaccharide-23 02/05/2008, 06/03/2008  . Td 02/05/2008, 06/03/2008   Pertinent  Health Maintenance Due  Topic Date Due  . DEXA SCAN  04/20/1988  . PNA vac Low Risk Adult (2 of 2 - PCV13) 06/03/2009  . INFLUENZA VACCINE  09/04/2016   Fall  Risk  05/30/2015 05/16/2015 04/25/2015 01/02/2015 09/06/2014  Falls in the past year? Yes No No Yes Yes  Number falls in past yr: 1 - - 1 1  Injury with Fall? Yes - - Yes Yes  Risk Factor Category  - - - High Fall Risk -  Risk for fall due to : History of fall(s);Impaired balance/gait;Impaired mobility - - History of fall(s) -  Follow up Falls evaluation completed;Education provided - - Falls evaluation completed -   Functional Status Survey:    Vitals:   07/11/16 1327  BP: 116/67  Pulse: (!) 54  Resp: (!) 22  Temp: 98.6 F (37 C)  SpO2: 92%    Weight: 127 lb (57.6 kg)  Height: 4\' 11"  (1.499 m)   Body mass index is 25.65 kg/m. Physical Exam  Constitutional: She is oriented to person, place, and time. She appears well-developed and well-nourished. No distress.  HENT:  Head: Normocephalic.  Mouth/Throat: Oropharynx is clear and moist. No oropharyngeal exudate.  HOH  Eyes: Conjunctivae and EOM are normal. Pupils are equal, round, and reactive to light. Right eye exhibits no discharge. Left eye exhibits no discharge. No scleral icterus.  Neck: Normal range of motion. No JVD present. No thyromegaly present.  Cardiovascular: Normal rate, regular rhythm, normal heart sounds and intact distal pulses.  Exam reveals no gallop and no friction rub.   No murmur heard. Pulmonary/Chest: Effort normal and breath sounds normal. No respiratory distress. She has no wheezes. She has no rales.  Abdominal: Soft. Bowel sounds are normal. She exhibits no distension. There is no tenderness. There is no rebound and no guarding.  Musculoskeletal: She exhibits no tenderness.  Moves x 4 extremities. Unsteady gait. Bilateral lower extremities 2-3 + pitting edema.   Lymphadenopathy:    She has no cervical adenopathy.  Neurological: She is oriented to person, place, and time.  Skin: Skin is warm and dry. No rash noted. No erythema. No pallor.  Psychiatric: She has a normal mood and affect.   Labs reviewed:  Recent Labs  01/15/16 05/13/16  NA 142 140  K 3.9 3.9  BUN 21 16  CREATININE 1.0 0.9    Recent Labs  01/15/16 05/13/16  AST 17 19  ALT 14 16  ALKPHOS 49 46    Recent Labs  01/15/16 05/13/16  WBC 7.1 6.5  HGB 13.0 12.4  HCT 40 38  PLT 283 312   Lab Results  Component Value Date   TSH 0.95 05/13/2016   Lab Results  Component Value Date   HGBA1C 6.1 05/13/2016   Lab Results  Component Value Date   CHOL 211 (A) 02/14/2014   HDL 47 02/14/2014   LDLCALC 133 02/14/2014   TRIG 155 02/14/2014    Assessment/Plan 1. Localized  edema Bilateral lower extremities 2-3 + Exam findings negative for cough,shortness of breath,rales or wheezing. Continue on Furosemide 40 mg tablet.Apply bilateral lower extremities Knee high Ted hose on in the morning and off at bedtime. Check CMP 07/15/2016.    2. Fatigue Lab Results  Component Value Date   TSH 0.95 05/13/2016  Her chronic depression could be contributing to her tiredness. Will obtain CMP.Continue to monitor.   Family/ staff Communication: Reviewed plan of care with patient and facility Nurse supervisor  Labs/tests ordered: [facility] 07/15/2016    Sandrea Hughs, NP

## 2016-07-15 DIAGNOSIS — M6281 Muscle weakness (generalized): Secondary | ICD-10-CM | POA: Diagnosis not present

## 2016-07-15 DIAGNOSIS — E785 Hyperlipidemia, unspecified: Secondary | ICD-10-CM | POA: Diagnosis not present

## 2016-07-15 DIAGNOSIS — J45909 Unspecified asthma, uncomplicated: Secondary | ICD-10-CM | POA: Diagnosis not present

## 2016-07-15 LAB — BASIC METABOLIC PANEL
BUN: 14 (ref 4–21)
Creatinine: 0.9 (ref 0.5–1.1)
Glucose: 85
Potassium: 3.9 (ref 3.4–5.3)
SODIUM: 140 (ref 137–147)

## 2016-07-15 LAB — HEPATIC FUNCTION PANEL
ALK PHOS: 50 (ref 25–125)
ALT: 11 (ref 7–35)
AST: 16 (ref 13–35)
Bilirubin, Total: 0.4

## 2016-07-15 LAB — CBC AND DIFFERENTIAL
HEMATOCRIT: 36 (ref 36–46)
Hemoglobin: 11.9 — AB (ref 12.0–16.0)
Platelets: 278 (ref 150–399)
WBC: 7.7

## 2016-07-15 LAB — TSH: TSH: 1.32 (ref 0.41–5.90)

## 2016-07-22 ENCOUNTER — Encounter: Payer: Self-pay | Admitting: Internal Medicine

## 2016-07-22 ENCOUNTER — Non-Acute Institutional Stay: Payer: Medicare Other | Admitting: Internal Medicine

## 2016-07-22 DIAGNOSIS — K219 Gastro-esophageal reflux disease without esophagitis: Secondary | ICD-10-CM

## 2016-07-22 DIAGNOSIS — R32 Unspecified urinary incontinence: Secondary | ICD-10-CM | POA: Diagnosis not present

## 2016-07-22 DIAGNOSIS — R6889 Other general symptoms and signs: Secondary | ICD-10-CM | POA: Diagnosis not present

## 2016-07-22 DIAGNOSIS — L308 Other specified dermatitis: Secondary | ICD-10-CM | POA: Diagnosis not present

## 2016-07-22 NOTE — Progress Notes (Signed)
Location:  [place] Room Number: 11 Place of Service:  ALF [phone]) Provider:    Blanchie Serve, MD  Patient Care Team: Blanchie Serve, MD as PCP - General (Internal Medicine) Newton Pigg, MD as Consulting Physician (Obstetrics and Gynecology) Monna Fam, MD as Consulting Physician (Ophthalmology) Melida Quitter, MD as Consulting Physician (Otolaryngology) Myrlene Broker, MD as Consulting Physician (Urology) Melina Modena, [facility], Man X, NP as Nurse Practitioner (Internal Medicine)  Extended Emergency Contact Information Primary Emergency Contact: Kimberly Greer Address: [place], [place] Phone: [phone] Work Phone: [phone] Mobile Phone: [phone] Relation: Son Secondary Emergency Contact: Teofilo Pod [place] of [place] Phone: [phone] Work Phone: [phone] Relation: Friend  Code Status:  Full code  Goals of care: Advanced Directive information Advanced Directives 07/22/2016  Does Patient Have a Medical Advance Directive? -  Type of Advance Directive Living will  Does patient want to make changes to medical advance directive? -  Copy of [place] in Chart? -     Chief Complaint  Patient presents with  . Acute Visit    medication management and skin check    HPI:  Pt is a 83 y.o. female seen today for an acute visit for discoloration to her buttocks noted on 06/18/16. Staff have been applying zinc oxide for now without improvement. She has urinary incontinence. She is sitting for long duration on her sofa/ couch. No fall or injury has been reported. Patient denies any pain or itching to this area. On medication review, not on any antiplatelet or anticoagulation.    Past Medical History:  Diagnosis Date  . Asthma   . Carpal tunnel syndrome 03/07/2009  . Disturbance of skin sensation 02/21/2009  . Diverticulosis of colon (without  mention of hemorrhage) 02/16/2009  . Fracture of upper end of left humerus 11/18/14  . 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 Hypothyroid   . Osteoarthrosis, unspecified whether generalized or localized, unspecified site 02/16/2009  . Other abnormal blood chemistry 06/19/2010   hyperglycemia  . Other and unspecified hyperlipidemia 10/24/2009  . Other atopic dermatitis and related conditions 02/16/2009  . Otosclerosis, unspecified 02/22/2003  . Pain in limb 12/05/2009  . Senile osteoporosis 06/16/2010  . Sensorineural hearing loss, unspecified 02/21/2009  . Syncope and collapse 05/30/2009  . Unspecified nasal polyp 02/21/2009  . Unspecified urinary incontinence 02/21/2009   Past Surgical History:  Procedure Laterality Date  . ABDOMINAL HYSTERECTOMY  1999   Dr. Collier Bullock  . BREAST BIOPSY  07/16/1990   benign left breast needle biopsy  Dr. Margot Chimes  . CARPAL TUNNEL RELEASE  04/07/2009   right Dr. Daylene Katayama  . CATARACT EXTRACTION W/ INTRAOCULAR LENS  IMPLANT, BILATERAL Bilateral   . TRIGGER FINGER RELEASE Left 08/09/2014   Procedure: RELEASE TRIGGER FINGER/A-1 PULLEY LEFT MIDDLE FINGER;  Surgeon: Daryll Brod, MD;  Location: [facility];  Service: Orthopedics;  Laterality: Left;  REGIONAL/FAB    Allergies  Allergen Reactions  . Avelox [Moxifloxacin Hcl In Nacl] Itching  . Doxycycline   . Hista-Tabs [Triprolidine-Pse] Other (See Comments)    Passed out  . Septra [Sulfamethoxazole-Trimethoprim]   . Sulfa Antibiotics     Itching/rash (all over)    Outpatient Encounter Prescriptions as of 07/22/2016  Medication Sig  . acetaminophen (TYLENOL) 325 MG tablet Take 2 tablets (650 mg total) by mouth every 6 (six) hours as needed for  mild pain or moderate pain.  Marland Kitchen albuterol (VENTOLIN HFA) 108 (90 Base) MCG/ACT inhaler Inhale 2 puffs by mouth every 6 hours as needed for shortness of breath  . feeding supplement (BOOST HIGH PROTEIN) LIQD Take 1 Container by mouth 2 (two) times daily between meals.   .  fluticasone (FLONASE) 50 MCG/ACT nasal spray Place 2 sprays into both nostrils daily.  . Fluticasone-Salmeterol (ADVAIR DISKUS) 250-50 MCG/DOSE AEPB Inhale once daily to help breathing  . furosemide (LASIX) 40 MG tablet Take 40 mg by mouth every morning.  Marland Kitchen levothyroxine (SYNTHROID, LEVOTHROID) 25 MCG tablet Take 25 mcg by mouth daily before breakfast.  . meloxicam (MOBIC) 7.5 MG tablet Take 7.5 mg by mouth daily as needed.   . mirtazapine (REMERON) 7.5 MG tablet Take 7.5 mg by mouth at bedtime.  . montelukast (SINGULAIR) 10 MG tablet Take one tablet by mouth once daily to help breathing  . MYRBETRIQ 25 MG TB24 tablet TAKE ONE TABLET BY MOUTH ONCE DAILY  . omeprazole (PRILOSEC) 20 MG capsule Take 20 mg by mouth daily. Take 1 tablet daily for heartburn or stomach reflux.  . raloxifene (EVISTA) 60 MG tablet Take 1 tablet by mouth  daily to treat osteoporosis  . sertraline (ZOLOFT) 50 MG tablet Take 50 mg by mouth daily.  Marland Kitchen ZINC OXIDE EX Apply 1 application topically 2 (two) times daily as needed (incontinence episodes causing redness).   No facility-administered encounter medications on file as of 07/22/2016.     Review of Systems  Constitutional: Negative for chills and fever.  Respiratory: Negative for shortness of breath.   Cardiovascular: Negative for chest pain and palpitations.  Gastrointestinal: Negative for abdominal pain, nausea and vomiting.  Genitourinary: Positive for frequency. Negative for dysuria.  Musculoskeletal: Negative for back pain.  Hematological: Does not bruise/bleed easily.  Psychiatric/Behavioral: Negative for behavioral problems.    Immunization History  Administered Date(s) Administered  . Influenza Whole 12/27/2011, 11/05/2012  . Influenza-Unspecified 11/18/2013, 11/03/2014, 11/16/2015  . PPD Test 04/04/2008, 11/23/2014  . Pneumococcal Polysaccharide-23 02/05/2008, 06/03/2008  . Td 02/05/2008, 06/03/2008   Pertinent  Health Maintenance Due  Topic Date Due   . DEXA SCAN  04/20/1988  . PNA vac Low Risk Adult (2 of 2 - PCV13) 06/03/2009  . INFLUENZA VACCINE  09/04/2016   Fall Risk  05/30/2015 05/16/2015 04/25/2015 01/02/2015 09/06/2014  Falls in the past year? Yes No No Yes Yes  Number falls in past yr: 1 - - 1 1  Injury with Fall? Yes - - Yes Yes  Risk Factor Category  - - - High Fall Risk -  Risk for fall due to : History of fall(s);Impaired balance/gait;Impaired mobility - - History of fall(s) -  Follow up Falls evaluation completed;Education provided - - Falls evaluation completed -   Functional Status Survey:    Vitals:   07/22/16 1326  BP: (!) 118/56  Pulse: (!) 54  Resp: 16  Temp: 99.4 F (37.4 C)  SpO2: 91%  Weight: 127 lb 3.2 oz (57.7 kg)  Height: 4\' 11"  (1.499 m)   Body mass index is 25.69 kg/m. Physical Exam  Constitutional: She is oriented to person, place, and time. She appears well-developed and well-nourished. No distress.  HENT:  Head: Normocephalic and atraumatic.  Mouth/Throat: Oropharynx is clear and moist.  Eyes: Conjunctivae are normal. Pupils are equal, round, and reactive to light.  Neck: Normal range of motion. Neck supple.  Cardiovascular: Normal rate and regular rhythm.   Pulmonary/Chest: Effort normal. She has wheezes.  Abdominal: Soft. Bowel sounds are normal.  Musculoskeletal: She exhibits edema.  Can move all 4 extremities, uses rollator walker  Lymphadenopathy:    She has no cervical adenopathy.  Neurological: She is alert and oriented to person, place, and time.  Skin: Skin is warm and dry. She is not diaphoretic.  Purplish discoloration to bilateral buttock including gluteal crease, mild erythema to groin area, no excoriation, no ecchymoses or petechiae, the discolored area blanches on application of pressure, no skin breakdown  Psychiatric: She has a normal mood and affect.    Labs reviewed:  Recent Labs  01/15/16 05/13/16 07/15/16  NA 142 140 140  K 3.9 3.9 3.9  BUN 21 16 14   CREATININE  1.0 0.9 0.9    Recent Labs  01/15/16 05/13/16 07/15/16  AST 17 19 16   ALT 14 16 11   ALKPHOS 49 46 50    Recent Labs  01/15/16 05/13/16 07/15/16  WBC 7.1 6.5 7.7  HGB 13.0 12.4 11.9*  HCT 40 38 36  PLT 283 312 278   Lab Results  Component Value Date   TSH 0.95 05/13/2016   Lab Results  Component Value Date   HGBA1C 6.1 05/13/2016   Lab Results  Component Value Date   CHOL 211 (A) 02/14/2014   HDL 47 02/14/2014   LDLCALC 133 02/14/2014   TRIG 155 02/14/2014    Significant Diagnostic Results in last 30 days:  No results found.  Assessment/Plan  Suspected deep tissue injury With purple discoloration with overlying skin intact and blanchable. Likely due to damage of underlying tissue by shear during her self transfer in bed and from pressure with her sitting for long hours on her buttock in the couch. Will need to provide gel cushion for her sofa and to encourage her to sit on it to help reduce risk for pressure ulcer. Advised to reposition her buttock every few hours while sitting. Zinc oxide to affected area bid after cleaning and drying her skin. Avoid heavy rub/scrub on her buttock for cleaning- only gentle cloth wash. Monitor for skin breakdown  gerd Stable symptom. Currently on omeprazole. With her on raloxifene, d/c omeprazole and start ranitidine 150 mg daily for now and monitor  incontinence associated dermatitis Continue myrbetriq. Clean area with mild soap and warm water, avoid excess force/ friction and mositurize skin daily. Also apply zinc oxide to protect the skin, if no improvement, notify provider.    Family/ staff Communication: reviewed care plan with patient and charge nurse.    Labs/tests ordered:  None  Blanchie Serve, MD Internal Medicine [facility] [address] [place], [place] [address] Cell Phone (Monday-Friday 8 am - 5 pm): [phone] On Call: [phone] and follow prompts after 5 pm and on  weekends Office Phone: [phone] Office Fax: [phone]

## 2016-08-12 ENCOUNTER — Encounter: Payer: Self-pay | Admitting: Nurse Practitioner

## 2016-08-12 ENCOUNTER — Encounter: Payer: Self-pay | Admitting: Family

## 2016-08-12 ENCOUNTER — Non-Acute Institutional Stay: Payer: Medicare Other | Admitting: Family

## 2016-08-12 DIAGNOSIS — W19XXXA Unspecified fall, initial encounter: Secondary | ICD-10-CM | POA: Diagnosis not present

## 2016-08-12 DIAGNOSIS — Y92129 Unspecified place in nursing home as the place of occurrence of the external cause: Secondary | ICD-10-CM

## 2016-08-12 DIAGNOSIS — N390 Urinary tract infection, site not specified: Secondary | ICD-10-CM | POA: Diagnosis not present

## 2016-08-12 DIAGNOSIS — R2681 Unsteadiness on feet: Secondary | ICD-10-CM | POA: Diagnosis not present

## 2016-08-12 NOTE — Progress Notes (Signed)
Location:  [facility] Room Number: 11 Place of Service:  ALF [phone]) Provider: Roselynn Whitacre FNP-C  Blanchie Serve, MD  Patient Care Team: Blanchie Serve, MD as PCP - General (Internal Medicine) Newton Pigg, MD as Consulting Physician (Obstetrics and Gynecology) Monna Fam, MD as Consulting Physician (Ophthalmology) Melida Quitter, MD as Consulting Physician (Otolaryngology) Myrlene Broker, MD as Consulting Physician (Urology) Melina Modena, [facility], Man X, NP as Nurse Practitioner (Internal Medicine)  Extended Emergency Contact Information Primary Emergency Contact: Sosnowski,[name] Address: [place], [place] Phone: [phone] Work Phone: [phone] Mobile Phone: [phone] Relation: Son Secondary Emergency Contact: Teofilo Pod [place] of [place] Phone: [phone] Work Phone: [phone] Relation: Friend  Code Status: DNR  Goals of care: Advanced Directive information Advanced Directives 08/12/2016  Does Patient Have a Medical Advance Directive? -  Type of Paramedic of [place];Living will;Out of facility DNR (pink MOST or yellow form)  Does patient want to make changes to medical advance directive? -  Copy of [place] in Chart? Yes     Chief Complaint  Patient presents with  . Acute Visit    fall    HPI:  Pt is a 83 y.o. female seen today at [facility] for an acute visit for follow up fall episode.She has a medical history of Asthma,OA, unsteady gait, GERD, hypothyroidism, HOH among other conditions.She is seen in her room today per facility Nurse request. Nurse reports patient was found on the bathroom floor upper back skin redness and a scratch on left elbow noted.she states was trying to reach up  for something when she lost balance and fell.she denies any fever, chills, dizziness or faintness. She complains  of generalized weakness.  Past Medical History:  Diagnosis Date  . Asthma   . Carpal tunnel syndrome 03/07/2009  . Disturbance of skin sensation 02/21/2009  . Diverticulosis of colon (without mention of hemorrhage) 02/16/2009  . Fracture of upper end of left humerus 11/18/14  . 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 Hypothyroid   . Osteoarthrosis, unspecified whether generalized or localized, unspecified site 02/16/2009  . Other abnormal blood chemistry 06/19/2010   hyperglycemia  . Other and unspecified hyperlipidemia 10/24/2009  . Other atopic dermatitis and related conditions 02/16/2009  . Otosclerosis, unspecified 02/22/2003  . Pain in limb 12/05/2009  . Senile osteoporosis 06/16/2010  . Sensorineural hearing loss, unspecified 02/21/2009  . Syncope and collapse 05/30/2009  . Unspecified nasal polyp 02/21/2009  . Unspecified urinary incontinence 02/21/2009   Past Surgical History:  Procedure Laterality Date  . ABDOMINAL HYSTERECTOMY  1999   Dr. Collier Bullock  . BREAST BIOPSY  07/16/1990   benign left breast needle biopsy  Dr. Margot Chimes  . CARPAL TUNNEL RELEASE  04/07/2009   right Dr. Daylene Katayama  . CATARACT EXTRACTION W/ INTRAOCULAR LENS  IMPLANT, BILATERAL Bilateral   . TRIGGER FINGER RELEASE Left 08/09/2014   Procedure: RELEASE TRIGGER FINGER/A-1 PULLEY LEFT MIDDLE FINGER;  Surgeon: Daryll Brod, MD;  Location: [facility];  Service: Orthopedics;  Laterality: Left;  REGIONAL/FAB    Allergies  Allergen Reactions  . Avelox [Moxifloxacin Hcl In Nacl] Itching  . Doxycycline   . Hista-Tabs [Triprolidine-Pse] Other (See Comments)    Passed out  . Septra [Sulfamethoxazole-Trimethoprim]   . Sulfa Antibiotics     Itching/rash (all over)    Allergies as of 08/12/2016  Reactions   Avelox [moxifloxacin Hcl In Nacl] Itching   Doxycycline    Hista-tabs [triprolidine-pse] Other (See Comments)   Passed out   Septra [sulfamethoxazole-trimethoprim]    Sulfa Antibiotics    Itching/rash (all  over)      Medication List       Accurate as of 08/12/16  3:40 PM. Always use your most recent med list.          acetaminophen 325 MG tablet Commonly known as:  TYLENOL Take 2 tablets (650 mg total) by mouth every 6 (six) hours as needed for mild pain or moderate pain.   feeding supplement Liqd Take 1 Container by mouth 2 (two) times daily between meals.   fluticasone 50 MCG/ACT nasal spray Commonly known as:  FLONASE Place 2 sprays into both nostrils daily.   Fluticasone-Salmeterol 250-50 MCG/DOSE Aepb Commonly known as:  ADVAIR DISKUS Inhale once daily to help breathing   furosemide 40 MG tablet Commonly known as:  LASIX Take 40 mg by mouth every morning.   levothyroxine 25 MCG tablet Commonly known as:  SYNTHROID, LEVOTHROID Take 25 mcg by mouth daily before breakfast.   meloxicam 7.5 MG tablet Commonly known as:  MOBIC Take 7.5 mg by mouth daily as needed.   mirtazapine 7.5 MG tablet Commonly known as:  REMERON Take 7.5 mg by mouth at bedtime.   montelukast 10 MG tablet Commonly known as:  SINGULAIR Take one tablet by mouth once daily to help breathing   MYRBETRIQ 25 MG Tb24 tablet Generic drug:  mirabegron ER TAKE ONE TABLET BY MOUTH ONCE DAILY   raloxifene 60 MG tablet Commonly known as:  EVISTA Take 1 tablet by mouth  daily to treat osteoporosis   ranitidine 150 MG capsule Commonly known as:  ZANTAC Take 150 mg by mouth daily.   sertraline 50 MG tablet Commonly known as:  ZOLOFT Take 50 mg by mouth daily.   VENTOLIN HFA 108 (90 Base) MCG/ACT inhaler Generic drug:  albuterol Inhale 2 puffs by mouth every 6 hours as needed for shortness of breath   ZINC OXIDE EX Apply 1 application topically 2 (two) times daily as needed (incontinence episodes causing redness).       Review of Systems  Constitutional: Positive for fatigue. Negative for activity change, appetite change, chills and fever.  HENT: Positive for hearing loss. Negative for  congestion, rhinorrhea, sinus pain, sinus pressure, sneezing and sore throat.   Eyes: Negative for discharge, redness and itching.  Respiratory: Negative for cough, chest tightness, shortness of breath and wheezing.   Cardiovascular: Positive for leg swelling. Negative for chest pain and palpitations.  Gastrointestinal: Negative for abdominal distention, abdominal pain, constipation, diarrhea, nausea and vomiting.  Endocrine: Negative for polydipsia, polyphagia and polyuria.  Genitourinary: Negative for dysuria, flank pain, frequency and urgency.  Musculoskeletal: Positive for arthralgias and gait problem.       Post fall in the Bathroom   Skin: Negative for color change, pallor, rash and wound.  Neurological: Negative for dizziness, seizures, syncope, light-headedness and headaches.       Generalized weakness   Psychiatric/Behavioral: Negative for agitation, confusion, hallucinations and sleep disturbance. The patient is not nervous/anxious.     Immunization History  Administered Date(s) Administered  . Influenza Whole 12/27/2011, 11/05/2012  . Influenza-Unspecified 11/18/2013, 11/03/2014, 11/16/2015  . PPD Test 04/04/2008, 11/23/2014  . Pneumococcal Polysaccharide-23 02/05/2008, 06/03/2008  . Td 02/05/2008, 06/03/2008   Pertinent  Health Maintenance Due  Topic Date Due  . DEXA SCAN  04/20/1988  . PNA vac Low Risk Adult (2 of 2 - PCV13) 06/03/2009  . INFLUENZA VACCINE  09/04/2016   Fall Risk  05/30/2015 05/16/2015 04/25/2015 01/02/2015 09/06/2014  Falls in the past year? Yes No No Yes Yes  Number falls in past yr: 1 - - 1 1  Injury with Fall? Yes - - Yes Yes  Risk Factor Category  - - - High Fall Risk -  Risk for fall due to : History of fall(s);Impaired balance/gait;Impaired mobility - - History of fall(s) -  Follow up Falls evaluation completed;Education provided - - Falls evaluation completed -    Vitals:   08/12/16 1447  BP: 131/64  Pulse: 68  Resp: 18  Temp: 99.3 F (37.4  C)  SpO2: 94%  Weight: 127 lb (57.6 kg)  Height: 4\' 11"  (1.499 m)   Body mass index is 25.65 kg/m. Physical Exam  Constitutional: She is oriented to person, place, and time. She appears well-developed and well-nourished. No distress.  HENT:  Head: Normocephalic.  Mouth/Throat: Oropharynx is clear and moist. No oropharyngeal exudate.  HOH  Eyes: Conjunctivae and EOM are normal. Pupils are equal, round, and reactive to light. Right eye exhibits no discharge. Left eye exhibits no discharge. No scleral icterus.  Neck: Normal range of motion. No JVD present. No thyromegaly present.  Cardiovascular: Normal rate, regular rhythm, normal heart sounds and intact distal pulses.  Exam reveals no gallop and no friction rub.   No murmur heard. Pulmonary/Chest: Effort normal and breath sounds normal. No respiratory distress. She has no wheezes. She has no rales.  Abdominal: Soft. Bowel sounds are normal. She exhibits no distension. There is no tenderness. There is no rebound and no guarding.  Musculoskeletal: She exhibits no tenderness.   Unsteady gait use Rolator. Bilateral lower extremities knee high Ted hose in place.   Lymphadenopathy:    She has no cervical adenopathy.  Neurological: She is oriented to person, place, and time.  Skin: Skin is warm and dry. No rash noted. No erythema. No pallor.  Psychiatric: She has a normal mood and affect.    Labs reviewed:  Recent Labs  01/15/16 05/13/16 07/15/16  NA 142 140 140  K 3.9 3.9 3.9  BUN 21 16 14   CREATININE 1.0 0.9 0.9    Recent Labs  01/15/16 05/13/16 07/15/16  AST 17 19 16   ALT 14 16 11   ALKPHOS 49 46 50    Recent Labs  01/15/16 05/13/16 07/15/16  WBC 7.1 6.5 7.7  HGB 13.0 12.4 11.9*  HCT 40 38 36  PLT 283 312 278   Lab Results  Component Value Date   TSH 0.95 05/13/2016   Lab Results  Component Value Date   HGBA1C 6.1 05/13/2016   Lab Results  Component Value Date   CHOL 211 (A) 02/14/2014   HDL 47 02/14/2014    LDLCALC 133 02/14/2014   TRIG 155 02/14/2014    Significant Diagnostic Results in last 30 days:  No results found.  Assessment/Plan 1. Unsteady gait Generalized weakness.Uses Rolator.Status post fall episode in the bathroom trying to reach up for something. Will have her work with PT/OT for ROM, Exercise, gait stability and Muscle strengthening.Check urine specimen for U/A and C/S rule out UTI.   2. Fall at nursing home, initial encounter Found in the bathroom floor. PT/OT as above. Continue with Fall and safety precautions. Encourage to call for assistance.  Family/ staff Communication: Reviewed plan of care with patient and facility Nurse.  Labs/tests ordered: U/A and C/S rule out UTI.   Sandrea Hughs, NP

## 2016-08-14 DIAGNOSIS — N3281 Overactive bladder: Secondary | ICD-10-CM | POA: Diagnosis not present

## 2016-08-14 DIAGNOSIS — R55 Syncope and collapse: Secondary | ICD-10-CM | POA: Diagnosis not present

## 2016-08-14 DIAGNOSIS — R29898 Other symptoms and signs involving the musculoskeletal system: Secondary | ICD-10-CM | POA: Diagnosis not present

## 2016-08-14 DIAGNOSIS — M6281 Muscle weakness (generalized): Secondary | ICD-10-CM | POA: Diagnosis not present

## 2016-08-14 DIAGNOSIS — M818 Other osteoporosis without current pathological fracture: Secondary | ICD-10-CM | POA: Diagnosis not present

## 2016-08-14 DIAGNOSIS — R296 Repeated falls: Secondary | ICD-10-CM | POA: Diagnosis not present

## 2016-08-14 DIAGNOSIS — R2681 Unsteadiness on feet: Secondary | ICD-10-CM | POA: Diagnosis not present

## 2016-08-15 DIAGNOSIS — R2681 Unsteadiness on feet: Secondary | ICD-10-CM | POA: Diagnosis not present

## 2016-08-15 DIAGNOSIS — R29898 Other symptoms and signs involving the musculoskeletal system: Secondary | ICD-10-CM | POA: Diagnosis not present

## 2016-08-15 DIAGNOSIS — M818 Other osteoporosis without current pathological fracture: Secondary | ICD-10-CM | POA: Diagnosis not present

## 2016-08-15 DIAGNOSIS — R296 Repeated falls: Secondary | ICD-10-CM | POA: Diagnosis not present

## 2016-08-15 DIAGNOSIS — M6281 Muscle weakness (generalized): Secondary | ICD-10-CM | POA: Diagnosis not present

## 2016-08-15 DIAGNOSIS — N3281 Overactive bladder: Secondary | ICD-10-CM | POA: Diagnosis not present

## 2016-08-19 ENCOUNTER — Encounter: Payer: Self-pay | Admitting: Internal Medicine

## 2016-08-19 ENCOUNTER — Non-Acute Institutional Stay: Payer: Medicare Other | Admitting: Internal Medicine

## 2016-08-19 DIAGNOSIS — R001 Bradycardia, unspecified: Secondary | ICD-10-CM | POA: Diagnosis not present

## 2016-08-19 DIAGNOSIS — I10 Essential (primary) hypertension: Secondary | ICD-10-CM | POA: Diagnosis not present

## 2016-08-19 DIAGNOSIS — R600 Localized edema: Secondary | ICD-10-CM | POA: Diagnosis not present

## 2016-08-19 DIAGNOSIS — F339 Major depressive disorder, recurrent, unspecified: Secondary | ICD-10-CM | POA: Diagnosis not present

## 2016-08-19 NOTE — Progress Notes (Addendum)
Location:  [facility] Room Number: AL- 11 Place of Service:  ALF [phone]) Provider:  Blanchie Serve MD  Blanchie Serve, MD  Patient Care Team: Blanchie Serve, MD as PCP - General (Internal Medicine) Newton Pigg, MD as Consulting Physician (Obstetrics and Gynecology) Monna Fam, MD as Consulting Physician (Ophthalmology) Melida Quitter, MD as Consulting Physician (Otolaryngology) Myrlene Broker, MD as Consulting Physician (Urology) Melina Modena, Miranda, Nelda Bucks, NP as Nurse Practitioner (Family Medicine)  Extended Emergency Contact Information Primary Emergency Contact: Zuccaro,[name] Address: [place], [place] Phone: [phone] Work Phone: [phone] Mobile Phone: [phone] Relation: Son Secondary Emergency Contact: Teofilo Pod [place] of [place] Phone: [phone] Work Phone: [phone] Relation: Friend  Code Status:  Full Code Goals of care: Advanced Directive information Advanced Directives 08/12/2016  Does Patient Have a Medical Advance Directive? -  Type of Paramedic of [place];Living will;Out of facility DNR (pink MOST or yellow form)  Does patient want to make changes to medical advance directive? -  Copy of [place] in Chart? Yes     Chief Complaint  Patient presents with  . Acute Visit    depression, bradycardia, leg edema    HPI:  Pt is a 83 y.o. female seen today for acute visit. She had a fall last week with generalized weakness. She is being followed by PT/OT for unsteady gait. She uses a rolling walker. There was concern for UTI and she had urine study that shows contaminant. Staff have concerns of her being depressed. They have also noted several reading with low heart rate. They have noted increased swelling to her legs. She is seen in her room today. On chart review temp of 100.7 on 08/14/16.     Past Medical History:  Diagnosis Date  . Asthma   . Carpal tunnel syndrome 03/07/2009  . Disturbance of skin sensation 02/21/2009  . Diverticulosis of colon (without mention of hemorrhage) 02/16/2009  . Fracture of upper end of left humerus 11/18/14  . 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 Hypothyroid   . Osteoarthrosis, unspecified whether generalized or localized, unspecified site 02/16/2009  . Other abnormal blood chemistry 06/19/2010   hyperglycemia  . Other and unspecified hyperlipidemia 10/24/2009  . Other atopic dermatitis and related conditions 02/16/2009  . Otosclerosis, unspecified 02/22/2003  . Pain in limb 12/05/2009  . Senile osteoporosis 06/16/2010  . Sensorineural hearing loss, unspecified 02/21/2009  . Syncope and collapse 05/30/2009  . Unspecified nasal polyp 02/21/2009  . Unspecified urinary incontinence 02/21/2009   Past Surgical History:  Procedure Laterality Date  . ABDOMINAL HYSTERECTOMY  1999   Dr. Collier Bullock  . BREAST BIOPSY  07/16/1990   benign left breast needle biopsy  Dr. Margot Chimes  . CARPAL TUNNEL RELEASE  04/07/2009   right Dr. Daylene Katayama  . CATARACT EXTRACTION W/ INTRAOCULAR LENS  IMPLANT, BILATERAL Bilateral   . TRIGGER FINGER RELEASE Left 08/09/2014   Procedure: RELEASE TRIGGER FINGER/A-1 PULLEY LEFT MIDDLE FINGER;  Surgeon: Daryll Brod, MD;  Location: [facility];  Service: Orthopedics;  Laterality: Left;  REGIONAL/FAB    Allergies  Allergen Reactions  . Avelox [Moxifloxacin Hcl In Nacl] Itching  . Doxycycline   . Hista-Tabs [Triprolidine-Pse] Other (See Comments)    Passed out  . Septra [Sulfamethoxazole-Trimethoprim]   . Sulfa Antibiotics     Itching/rash (all over)    Outpatient  Encounter Prescriptions as of 08/19/2016  Medication Sig  . acetaminophen (TYLENOL) 325 MG tablet Take 2 tablets (650 mg total) by mouth every 6 (six) hours as needed for mild pain or moderate pain.  Marland Kitchen albuterol (VENTOLIN HFA) 108 (90 Base) MCG/ACT inhaler Inhale 2  puffs by mouth every 6 hours as needed for shortness of breath  . feeding supplement (BOOST HIGH PROTEIN) LIQD Take 1 Container by mouth 2 (two) times daily between meals.   . fluticasone (FLONASE) 50 MCG/ACT nasal spray Place 2 sprays into both nostrils daily.  . Fluticasone-Salmeterol (ADVAIR DISKUS) 250-50 MCG/DOSE AEPB Inhale once daily to help breathing  . furosemide (LASIX) 40 MG tablet Take 40 mg by mouth every morning.  Marland Kitchen levothyroxine (SYNTHROID, LEVOTHROID) 25 MCG tablet Take 25 mcg by mouth daily before breakfast.  . meloxicam (MOBIC) 7.5 MG tablet Take 7.5 mg by mouth daily as needed.   . mirtazapine (REMERON) 7.5 MG tablet Take 7.5 mg by mouth at bedtime.  . montelukast (SINGULAIR) 10 MG tablet Take one tablet by mouth once daily to help breathing  . MYRBETRIQ 25 MG TB24 tablet TAKE ONE TABLET BY MOUTH ONCE DAILY  . promethazine (PHENERGAN) 12.5 MG suppository Place 12.5 mg rectally every 4 (four) hours as needed for nausea or vomiting.  . raloxifene (EVISTA) 60 MG tablet Take 1 tablet by mouth  daily to treat osteoporosis  . ranitidine (ZANTAC) 150 MG capsule Take 150 mg by mouth daily.  . sertraline (ZOLOFT) 50 MG tablet Take 50 mg by mouth daily.  Marland Kitchen ZINC OXIDE EX Apply 1 application topically 2 (two) times daily.    No facility-administered encounter medications on file as of 08/19/2016.     Review of Systems  Constitutional: Positive for activity change and appetite change. Negative for chills, diaphoresis, fatigue and fever.  HENT: Positive for hearing loss. Negative for congestion, ear pain, mouth sores, rhinorrhea, sore throat and trouble swallowing.   Respiratory: Positive for cough and shortness of breath. Negative for wheezing.   Cardiovascular: Positive for leg swelling. Negative for chest pain and palpitations.  Gastrointestinal: Negative for abdominal pain, diarrhea, nausea and vomiting.  Genitourinary: Negative for dysuria.  Musculoskeletal: Positive for gait  problem. Negative for back pain.  Skin: Negative for rash.  Neurological: Negative for dizziness, syncope, light-headedness and headaches.  Hematological: Does not bruise/bleed easily.  Psychiatric/Behavioral: Negative for behavioral problems.    Immunization History  Administered Date(s) Administered  . Influenza Whole 12/27/2011, 11/05/2012  . Influenza-Unspecified 11/18/2013, 11/03/2014, 11/16/2015  . PPD Test 04/04/2008, 11/23/2014  . Pneumococcal Polysaccharide-23 02/05/2008, 06/03/2008  . Td 02/05/2008, 06/03/2008   Pertinent  Health Maintenance Due  Topic Date Due  . DEXA SCAN  02/04/2017 (Originally 04/20/1988)  . PNA vac Low Risk Adult (2 of 2 - PCV13) 02/04/2017 (Originally 06/03/2009)  . INFLUENZA VACCINE  09/04/2016   Fall Risk  05/30/2015 05/16/2015 04/25/2015 01/02/2015 09/06/2014  Falls in the past year? Yes No No Yes Yes  Number falls in past yr: 1 - - 1 1  Injury with Fall? Yes - - Yes Yes  Risk Factor Category  - - - High Fall Risk -  Risk for fall due to : History of fall(s);Impaired balance/gait;Impaired mobility - - History of fall(s) -  Follow up Falls evaluation completed;Education provided - - Falls evaluation completed -   Functional Status Survey:    Vitals:   08/19/16 1212  BP: (!) 102/58  Pulse: 60  Resp: 18  Temp: 98.4 F (36.9 C)  TempSrc: Oral  SpO2: 94%  Weight: 126 lb (57.2 kg)  Height: 4\' 11"  (1.499 m)   Body mass index is 25.45 kg/m.   Wt Readings from Last 3 Encounters:  08/19/16 126 lb (57.2 kg)  08/12/16 127 lb (57.6 kg)  07/22/16 127 lb 3.2 oz (57.7 kg)   Physical Exam  Constitutional: She appears well-developed and well-nourished. No distress.  HENT:  Head: Normocephalic and atraumatic.  Right Ear: External ear normal.  Left Ear: External ear normal.  Mouth/Throat: Oropharynx is clear and moist. No oropharyngeal exudate.  Eyes: Pupils are equal, round, and reactive to light. Conjunctivae are normal. Right eye exhibits no  discharge. Left eye exhibits no discharge. No scleral icterus.  Neck: Normal range of motion. Neck supple. No JVD present. No thyromegaly present.  Cardiovascular: Normal rate and regular rhythm.   No murmur heard. Pulmonary/Chest: Effort normal. No respiratory distress. She has no wheezes. She has rales.  Abdominal: Soft. Bowel sounds are normal. She exhibits no distension. There is no tenderness. There is no rebound and no guarding.  Musculoskeletal: She exhibits edema.  Can move all 4 extremities, 1+ pitting edema, has ted hose, limited left shoulder ROM, arthritis changes to her fingers, uses rolling walker  Lymphadenopathy:    She has no cervical adenopathy.  Neurological: She is alert.  Oriented to self and place, not to time and person, has tremors  Skin: Skin is warm and dry. She is not diaphoretic.  Psychiatric:  Flat affect, poor eye contact    Labs reviewed:  Recent Labs  01/15/16 05/13/16 07/15/16  NA 142 140 140  K 3.9 3.9 3.9  BUN 21 16 14   CREATININE 1.0 0.9 0.9    Recent Labs  01/15/16 05/13/16 07/15/16  AST 17 19 16   ALT 14 16 11   ALKPHOS 49 46 50    Recent Labs  01/15/16 05/13/16 07/15/16  WBC 7.1 6.5 7.7  HGB 13.0 12.4 11.9*  HCT 40 38 36  PLT 283 312 278   Lab Results  Component Value Date   TSH 0.95 05/13/2016   Lab Results  Component Value Date   HGBA1C 6.1 05/13/2016   Lab Results  Component Value Date   CHOL 211 (A) 02/14/2014   HDL 47 02/14/2014   LDLCALC 133 02/14/2014   TRIG 155 02/14/2014    Significant Diagnostic Results in last 30 days:  No results found.  Assessment/Plan  Depression Currently on remeron 7.5 mg daily and also on thyroid medication. Check TSH level. Increase her remeron to 15 mg daily for now and reassess.  Lab Results  Component Value Date   TSH 0.95 05/13/2016   Leg edema With rales on lung exam, dyspnea present and increased leg edema. Weight is stable. Currently on lasix 40 mg daily. Increase this  to 40 mg in am and 20 mg in pm x 5 days with holding parameters, then reassess. Check weight 3 days a week for now. Apply ted hose and to keep legs elevated at rest. If no improvement, consider chest xray. Check BMP   Bradycardia Several readings in 50s. With hx of hypothyroidism, check tsh level. Get EKG to rule out AV block. Pt denies any dizziness but recently had a fall. Monitor VS bid x 1 week- manually. Lab Results  Component Value Date   TSH 0.95 05/13/2016     Family/ staff Communication: reviewed care plan with patient and charge nurse.    Labs/tests ordered:  TSH, EKG, BMP   Forrestine Lecrone  Bubba Camp, MD Internal Medicine [facility] [place], [place] [address] Cell Phone (Monday-Friday 8 am - 5 pm): [phone] On Call: [phone] and follow prompts after 5 pm and on weekends Office Phone: [phone] Office Fax: [phone]   08/19/16 EKG resulted. Sinus bradycardia with HR 56/min, normal PR interval, normal QRS complex. ST depression in lead I and aVL seen in prior ecg from 11/08/14. Pt currently asymptomatic, will monitor clinically. Pending TSH on next lab draw.    Blanchie Serve, MD  [facility] Adult Medicine [phone] (Monday-Friday 8 am - 5 pm) [phone] (afterhours)

## 2016-08-19 NOTE — Addendum Note (Signed)
Addended byBlanchie Serve on: 08/19/2016 03:45 PM   Modules accepted: Level of Service

## 2016-08-20 DIAGNOSIS — R29898 Other symptoms and signs involving the musculoskeletal system: Secondary | ICD-10-CM | POA: Diagnosis not present

## 2016-08-20 DIAGNOSIS — N3281 Overactive bladder: Secondary | ICD-10-CM | POA: Diagnosis not present

## 2016-08-20 DIAGNOSIS — M6281 Muscle weakness (generalized): Secondary | ICD-10-CM | POA: Diagnosis not present

## 2016-08-20 DIAGNOSIS — R2681 Unsteadiness on feet: Secondary | ICD-10-CM | POA: Diagnosis not present

## 2016-08-20 DIAGNOSIS — R296 Repeated falls: Secondary | ICD-10-CM | POA: Diagnosis not present

## 2016-08-20 DIAGNOSIS — M818 Other osteoporosis without current pathological fracture: Secondary | ICD-10-CM | POA: Diagnosis not present

## 2016-08-22 DIAGNOSIS — R29898 Other symptoms and signs involving the musculoskeletal system: Secondary | ICD-10-CM | POA: Diagnosis not present

## 2016-08-22 DIAGNOSIS — R001 Bradycardia, unspecified: Secondary | ICD-10-CM | POA: Diagnosis not present

## 2016-08-22 DIAGNOSIS — M818 Other osteoporosis without current pathological fracture: Secondary | ICD-10-CM | POA: Diagnosis not present

## 2016-08-22 DIAGNOSIS — R296 Repeated falls: Secondary | ICD-10-CM | POA: Diagnosis not present

## 2016-08-22 DIAGNOSIS — N3281 Overactive bladder: Secondary | ICD-10-CM | POA: Diagnosis not present

## 2016-08-22 DIAGNOSIS — R2681 Unsteadiness on feet: Secondary | ICD-10-CM | POA: Diagnosis not present

## 2016-08-22 DIAGNOSIS — M6281 Muscle weakness (generalized): Secondary | ICD-10-CM | POA: Diagnosis not present

## 2016-08-23 DIAGNOSIS — M818 Other osteoporosis without current pathological fracture: Secondary | ICD-10-CM | POA: Diagnosis not present

## 2016-08-23 DIAGNOSIS — R296 Repeated falls: Secondary | ICD-10-CM | POA: Diagnosis not present

## 2016-08-23 DIAGNOSIS — M6281 Muscle weakness (generalized): Secondary | ICD-10-CM | POA: Diagnosis not present

## 2016-08-23 DIAGNOSIS — R2681 Unsteadiness on feet: Secondary | ICD-10-CM | POA: Diagnosis not present

## 2016-08-23 DIAGNOSIS — N3281 Overactive bladder: Secondary | ICD-10-CM | POA: Diagnosis not present

## 2016-08-23 DIAGNOSIS — R29898 Other symptoms and signs involving the musculoskeletal system: Secondary | ICD-10-CM | POA: Diagnosis not present

## 2016-08-26 ENCOUNTER — Encounter: Payer: Self-pay | Admitting: Internal Medicine

## 2016-08-26 ENCOUNTER — Non-Acute Institutional Stay: Payer: Medicare Other | Admitting: Internal Medicine

## 2016-08-26 DIAGNOSIS — M6281 Muscle weakness (generalized): Secondary | ICD-10-CM | POA: Diagnosis not present

## 2016-08-26 DIAGNOSIS — R001 Bradycardia, unspecified: Secondary | ICD-10-CM

## 2016-08-26 DIAGNOSIS — M818 Other osteoporosis without current pathological fracture: Secondary | ICD-10-CM | POA: Diagnosis not present

## 2016-08-26 DIAGNOSIS — R296 Repeated falls: Secondary | ICD-10-CM | POA: Diagnosis not present

## 2016-08-26 DIAGNOSIS — R0902 Hypoxemia: Secondary | ICD-10-CM | POA: Diagnosis not present

## 2016-08-26 DIAGNOSIS — R29898 Other symptoms and signs involving the musculoskeletal system: Secondary | ICD-10-CM | POA: Diagnosis not present

## 2016-08-26 DIAGNOSIS — E039 Hypothyroidism, unspecified: Secondary | ICD-10-CM

## 2016-08-26 DIAGNOSIS — F341 Dysthymic disorder: Secondary | ICD-10-CM | POA: Diagnosis not present

## 2016-08-26 DIAGNOSIS — M150 Primary generalized (osteo)arthritis: Secondary | ICD-10-CM | POA: Diagnosis not present

## 2016-08-26 DIAGNOSIS — R2681 Unsteadiness on feet: Secondary | ICD-10-CM | POA: Diagnosis not present

## 2016-08-26 DIAGNOSIS — M159 Polyosteoarthritis, unspecified: Secondary | ICD-10-CM

## 2016-08-26 DIAGNOSIS — J45901 Unspecified asthma with (acute) exacerbation: Secondary | ICD-10-CM | POA: Diagnosis not present

## 2016-08-26 DIAGNOSIS — R600 Localized edema: Secondary | ICD-10-CM | POA: Diagnosis not present

## 2016-08-26 DIAGNOSIS — R05 Cough: Secondary | ICD-10-CM | POA: Diagnosis not present

## 2016-08-26 DIAGNOSIS — N3281 Overactive bladder: Secondary | ICD-10-CM

## 2016-08-26 DIAGNOSIS — F329 Major depressive disorder, single episode, unspecified: Secondary | ICD-10-CM | POA: Insufficient documentation

## 2016-08-26 NOTE — Progress Notes (Signed)
Location:  [facility] Room Number: 11 Place of Service:  ALF [phone]) Provider:  Blanchie Serve MD  Blanchie Serve, MD  Patient Care Team: Blanchie Serve, MD as PCP - General (Internal Medicine) Newton Pigg, MD as Consulting Physician (Obstetrics and Gynecology) Monna Fam, MD as Consulting Physician (Ophthalmology) Melida Quitter, MD as Consulting Physician (Otolaryngology) Myrlene Broker, MD as Consulting Physician (Urology) Melina Modena, Ursina, Nelda Bucks, NP as Nurse Practitioner (Family Medicine)  Extended Emergency Contact Information Primary Emergency Contact: Rosado,[name] Address: [place], [place] Phone: [phone] Work Phone: [phone] Mobile Phone: [phone] Relation: Son Secondary Emergency Contact: Teofilo Pod [place] Phone: [phone] Work Phone: [phone] Relation: Friend  Code Status:  DNR Goals of care: Advanced Directive information Advanced Directives 08/12/2016  Does Patient Have a Medical Advance Directive? -  Type of Paramedic of [place];Living will;Out of facility DNR (pink MOST or yellow form)  Does patient want to make changes to medical advance directive? -  Copy of [place] in Chart? Yes     Chief Complaint  Patient presents with  . Medical Management of Chronic Issues    Routine Visit     HPI:  Pt is a 83 y.o. female seen today for medical management of chronic diseases. She complaints of shortness of breath with minimal exertion. Nursing reports her being hypoxic yesterday evening to mid 70s and then her o2 sat came up to 90s without any intervention. This am her o2 sat dropped to 88% on room air, they gave her nebulizer treatment and this improved. She has been coughing for 3 days. No fever reported. She denies any other concern. Her swelling to the legs have subsided.     Past Medical History:  Diagnosis Date  . Asthma   . Carpal tunnel syndrome 03/07/2009  . Disturbance of skin sensation 02/21/2009  . Diverticulosis of colon (without mention of hemorrhage) 02/16/2009  . Fracture of upper end of left humerus 11/18/14  . 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 Hypothyroid   . Osteoarthrosis, unspecified whether generalized or localized, unspecified site 02/16/2009  . Other abnormal blood chemistry 06/19/2010   hyperglycemia  . Other and unspecified hyperlipidemia 10/24/2009  . Other atopic dermatitis and related conditions 02/16/2009  . Otosclerosis, unspecified 02/22/2003  . Pain in limb 12/05/2009  . Senile osteoporosis 06/16/2010  . Sensorineural hearing loss, unspecified 02/21/2009  . Syncope and collapse 05/30/2009  . Unspecified nasal polyp 02/21/2009  . Unspecified urinary incontinence 02/21/2009   Past Surgical History:  Procedure Laterality Date  . ABDOMINAL HYSTERECTOMY  1999   Dr. Collier Bullock  . BREAST BIOPSY  07/16/1990   benign left breast needle biopsy  Dr. Margot Chimes  . CARPAL TUNNEL RELEASE  04/07/2009   right Dr. Daylene Katayama  . CATARACT EXTRACTION W/ INTRAOCULAR LENS  IMPLANT, BILATERAL Bilateral   . TRIGGER FINGER RELEASE Left 08/09/2014   Procedure: RELEASE TRIGGER FINGER/A-1 PULLEY LEFT MIDDLE FINGER;  Surgeon: Daryll Brod, MD;  Location: [facility];  Service: Orthopedics;  Laterality: Left;  REGIONAL/FAB    Allergies  Allergen Reactions  . Avelox [Moxifloxacin Hcl In Nacl] Itching  . Doxycycline   . Hista-Tabs [Triprolidine-Pse] Other (See Comments)    Passed out  . Septra [Sulfamethoxazole-Trimethoprim]   . Sulfa Antibiotics     Itching/rash (all over)    Outpatient Encounter  Prescriptions as of 08/26/2016  Medication Sig  . acetaminophen (TYLENOL) 325 MG tablet Take 2 tablets (650 mg total) by mouth every 6 (six) hours as needed for mild pain or moderate pain.  Marland Kitchen albuterol (VENTOLIN HFA) 108 (90 Base) MCG/ACT inhaler Inhale 2  puffs by mouth every 6 hours as needed for shortness of breath  . feeding supplement (BOOST HIGH PROTEIN) LIQD Take 1 Container by mouth 2 (two) times daily between meals.   . fluticasone (FLONASE) 50 MCG/ACT nasal spray Place 2 sprays into both nostrils daily.  . Fluticasone-Salmeterol (ADVAIR DISKUS) 250-50 MCG/DOSE AEPB Inhale once daily to help breathing  . furosemide (LASIX) 40 MG tablet Take 40 mg by mouth every morning.  Marland Kitchen levothyroxine (SYNTHROID, LEVOTHROID) 25 MCG tablet Take 25 mcg by mouth daily before breakfast.  . meloxicam (MOBIC) 7.5 MG tablet Take 7.5 mg by mouth daily as needed.   . mirtazapine (REMERON) 15 MG tablet Take 15 mg by mouth at bedtime.  . montelukast (SINGULAIR) 10 MG tablet Take one tablet by mouth once daily to help breathing  . MYRBETRIQ 25 MG TB24 tablet TAKE ONE TABLET BY MOUTH ONCE DAILY  . promethazine (PHENERGAN) 12.5 MG suppository Place 12.5 mg rectally every 4 (four) hours as needed for nausea or vomiting.  . raloxifene (EVISTA) 60 MG tablet Take 1 tablet by mouth  daily to treat osteoporosis  . ranitidine (ZANTAC) 150 MG capsule Take 150 mg by mouth daily.  . sertraline (ZOLOFT) 50 MG tablet Take 50 mg by mouth daily.  Marland Kitchen ZINC OXIDE EX Apply 1 application topically 2 (two) times daily.   . [DISCONTINUED] mirtazapine (REMERON) 7.5 MG tablet Take 7.5 mg by mouth at bedtime.   No facility-administered encounter medications on file as of 08/26/2016.     Review of Systems  Constitutional: Positive for appetite change. Negative for chills, diaphoresis and fever.  HENT: Positive for congestion. Negative for mouth sores, rhinorrhea, sinus pain and sinus pressure.   Eyes: Positive for visual disturbance. Negative for pain, redness and itching.  Respiratory: Positive for cough, shortness of breath and wheezing.   Cardiovascular: Positive for leg swelling. Negative for chest pain and palpitations.  Gastrointestinal: Negative for abdominal pain, constipation,  diarrhea, nausea and vomiting.  Genitourinary: Negative for dysuria.  Musculoskeletal: Positive for gait problem. Negative for arthralgias and back pain.       Uses a walker  Neurological: Negative for dizziness, seizures, syncope, light-headedness, numbness and headaches.  Psychiatric/Behavioral: Positive for confusion and decreased concentration. Negative for agitation.    Immunization History  Administered Date(s) Administered  . Influenza Whole 12/27/2011, 11/05/2012  . Influenza-Unspecified 11/18/2013, 11/03/2014, 11/16/2015  . PPD Test 04/04/2008, 11/23/2014  . Pneumococcal Polysaccharide-23 02/05/2008, 06/03/2008  . Td 02/05/2008, 06/03/2008   Pertinent  Health Maintenance Due  Topic Date Due  . DEXA SCAN  02/04/2017 (Originally 04/20/1988)  . PNA vac Low Risk Adult (2 of 2 - PCV13) 02/04/2017 (Originally 06/03/2009)  . INFLUENZA VACCINE  09/04/2016   Fall Risk  05/30/2015 05/16/2015 04/25/2015 01/02/2015 09/06/2014  Falls in the past year? Yes No No Yes Yes  Number falls in past yr: 1 - - 1 1  Injury with Fall? Yes - - Yes Yes  Risk Factor Category  - - - High Fall Risk -  Risk for fall due to : History of fall(s);Impaired balance/gait;Impaired mobility - - History of fall(s) -  Follow up Falls evaluation completed;Education provided - - Falls evaluation completed -   Functional Status Survey:  Vitals:   08/26/16 1016  BP: 110/60  Pulse: 62  Resp: 18  Temp: (!) 97.2 F (36.2 C)  TempSrc: Oral  SpO2: 91%  Weight: 124 lb 8 oz (56.5 kg)  Height: 4\' 11"  (1.499 m)   Body mass index is 25.15 kg/m. Physical Exam  Constitutional: She appears well-developed and well-nourished. No distress.  HENT:  Head: Normocephalic and atraumatic.  Mouth/Throat: Oropharynx is clear and moist.  Eyes: Pupils are equal, round, and reactive to light. Conjunctivae are normal.  Neck: Normal range of motion. Neck supple. No JVD present.  Cardiovascular: Normal rate and regular rhythm.     Pulmonary/Chest: She exhibits no tenderness.  Decreased air entry to lung bases, has expiratory wheezing and occasional rhonchi, short of breath in between sentences noted, using her abdominal muscles after exertion to breathe  Abdominal: Soft. Bowel sounds are normal. She exhibits no distension. There is no tenderness. There is no guarding.  Musculoskeletal:  Trace leg edema, can move all 4 extremities, uses a walker  Lymphadenopathy:    She has no cervical adenopathy.  Neurological: She is alert.  Oriented to person and place only  Skin: Skin is warm and dry. She is not diaphoretic.  Chronic skin changes to lower extremities  Psychiatric: She has a normal mood and affect.  Good eye contact    Labs reviewed:  Recent Labs  01/15/16 05/13/16 07/15/16  NA 142 140 140  K 3.9 3.9 3.9  BUN 21 16 14   CREATININE 1.0 0.9 0.9    Recent Labs  01/15/16 05/13/16 07/15/16  AST 17 19 16   ALT 14 16 11   ALKPHOS 49 46 50    Recent Labs  01/15/16 05/13/16 07/15/16  WBC 7.1 6.5 7.7  HGB 13.0 12.4 11.9*  HCT 40 38 36  PLT 283 312 278   Lab Results  Component Value Date   TSH 1.32 07/15/2016   Lab Results  Component Value Date   HGBA1C 6.1 05/13/2016   Lab Results  Component Value Date   CHOL 211 (A) 02/14/2014   HDL 47 02/14/2014   LDLCALC 133 02/14/2014   TRIG 155 02/14/2014    Significant Diagnostic Results in last 30 days:  No results found.  Assessment/Plan  Hypoxia Currently o2 sat 91%. Decreased air entry on lung exam with wheezing and rhonchi. Obtain chest xray to rule out infiltrate and pulmonary congestion  Asthma exacerbation With worsening dyspnea, cough and wheezing with drop in o2 sat. Currently on singulair, advair and prn albuterol. Change albuterol to duoneb tid x 1 week, then q6h prn. Add prednisone 40 mg po daily x 5 days. Obtain chest xray to rule out pneumonia. She is afebrile. Hold off on antibiotic for now.   Leg edema Improved on clinical exam.  Her furosemide was increased for 5 days on last visit and she is now on lasix 40 mg daily. She has been keeping her legs elevated at rest. Continue furosemide, she has lost weight. Monitor clinically. Pending BMP.  Wt Readings from Last 3 Encounters:  08/26/16 124 lb 8 oz (56.5 kg)  08/19/16 126 lb (57.2 kg)  08/12/16 127 lb (57.6 kg)   Depression Improved mood somewhat, continue remeron 15 mg daily for now with sertraline 50 mg daily.   Bradycardia Stable, EKG showed sinus bradycardia. Monitor clinically  OAB Continue her myrbetriq and monitor  OA Continue meloxicam as needed for pain, denies any this visit.   Hypothyroidism Lab Results  Component Value Date   TSH  1.32 07/15/2016   Continue levothyroxine, no changes made   Family/ staff Communication: reviewed care plan with patient and charge nurse.   Labs/tests ordered:  Chest xray, cbc with diff, cmp   Blanchie Serve, MD Internal Medicine [facility] [address] [place], [place] [address] Cell Phone (Monday-Friday 8 am - 5 pm): [phone] On Call: [phone] and follow prompts after 5 pm and on weekends Office Phone: [phone] Office Fax: [phone]

## 2016-08-27 ENCOUNTER — Other Ambulatory Visit: Payer: Self-pay

## 2016-08-27 DIAGNOSIS — R2681 Unsteadiness on feet: Secondary | ICD-10-CM | POA: Diagnosis not present

## 2016-08-27 DIAGNOSIS — N3281 Overactive bladder: Secondary | ICD-10-CM | POA: Diagnosis not present

## 2016-08-27 DIAGNOSIS — R296 Repeated falls: Secondary | ICD-10-CM | POA: Diagnosis not present

## 2016-08-27 DIAGNOSIS — M6281 Muscle weakness (generalized): Secondary | ICD-10-CM | POA: Diagnosis not present

## 2016-08-27 DIAGNOSIS — M818 Other osteoporosis without current pathological fracture: Secondary | ICD-10-CM | POA: Diagnosis not present

## 2016-08-27 DIAGNOSIS — R29898 Other symptoms and signs involving the musculoskeletal system: Secondary | ICD-10-CM | POA: Diagnosis not present

## 2016-08-28 ENCOUNTER — Encounter: Payer: Self-pay | Admitting: Internal Medicine

## 2016-08-28 ENCOUNTER — Non-Acute Institutional Stay: Payer: Medicare Other | Admitting: Internal Medicine

## 2016-08-28 DIAGNOSIS — J45901 Unspecified asthma with (acute) exacerbation: Secondary | ICD-10-CM

## 2016-08-28 DIAGNOSIS — N3281 Overactive bladder: Secondary | ICD-10-CM | POA: Diagnosis not present

## 2016-08-28 DIAGNOSIS — R29898 Other symptoms and signs involving the musculoskeletal system: Secondary | ICD-10-CM | POA: Diagnosis not present

## 2016-08-28 DIAGNOSIS — R2681 Unsteadiness on feet: Secondary | ICD-10-CM | POA: Diagnosis not present

## 2016-08-28 DIAGNOSIS — M6281 Muscle weakness (generalized): Secondary | ICD-10-CM | POA: Diagnosis not present

## 2016-08-28 DIAGNOSIS — M818 Other osteoporosis without current pathological fracture: Secondary | ICD-10-CM | POA: Diagnosis not present

## 2016-08-28 DIAGNOSIS — R296 Repeated falls: Secondary | ICD-10-CM | POA: Diagnosis not present

## 2016-08-28 NOTE — Progress Notes (Signed)
Location:  [facility] Room Number: 11 Place of Service:  ALF [phone]) Provider:  Blanchie Serve MD  Blanchie Serve, MD  Patient Care Team: Blanchie Serve, MD as PCP - General (Internal Medicine) Newton Pigg, MD as Consulting Physician (Obstetrics and Gynecology) Monna Fam, MD as Consulting Physician (Ophthalmology) Melida Quitter, MD as Consulting Physician (Otolaryngology) Myrlene Broker, MD as Consulting Physician (Urology) Melina Modena, [place], Nelda Bucks, NP as Nurse Practitioner (Family Medicine)  Extended Emergency Contact Information Primary Emergency Contact: Rothman,[name] Address: [place], [place] Phone: [phone] Work Phone: [phone] Mobile Phone: [phone] Relation: Son Secondary Emergency Contact: Teofilo Pod [place] of [place] Phone: [phone] Work Phone: [phone] Relation: Friend  Code Status:  DNR Goals of care: Advanced Directive information Advanced Directives 08/12/2016  Does Patient Have a Medical Advance Directive? -  Type of Paramedic of [place];Living will;Out of facility DNR (pink MOST or yellow form)  Does patient want to make changes to medical advance directive? -  Copy of [place] in Chart? Yes     Chief Complaint  Patient presents with  . Acute Visit    Follow up on xray    HPI:  Pt is a 83 y.o. female seen today for follow up visit for her dyspnea and asthma exacerbation. She was started on prednisone and scheduled nebulizer treatment 2 days back. Her chest xray has resulted. She continues to cough but has more productive cough. Her dyspnea has improved some and her oxygen saturation is above 90%. No fever or chills reported.    Past Medical History:  Diagnosis Date  . Asthma   . Carpal tunnel syndrome 03/07/2009  . Disturbance of skin sensation 02/21/2009  . Diverticulosis  of colon (without mention of hemorrhage) 02/16/2009  . Fracture of upper end of left humerus 11/18/14  . 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 Hypothyroid   . Osteoarthrosis, unspecified whether generalized or localized, unspecified site 02/16/2009  . Other abnormal blood chemistry 06/19/2010   hyperglycemia  . Other and unspecified hyperlipidemia 10/24/2009  . Other atopic dermatitis and related conditions 02/16/2009  . Otosclerosis, unspecified 02/22/2003  . Pain in limb 12/05/2009  . Senile osteoporosis 06/16/2010  . Sensorineural hearing loss, unspecified 02/21/2009  . Syncope and collapse 05/30/2009  . Unspecified nasal polyp 02/21/2009  . Unspecified urinary incontinence 02/21/2009   Past Surgical History:  Procedure Laterality Date  . ABDOMINAL HYSTERECTOMY  1999   Dr. Collier Bullock  . BREAST BIOPSY  07/16/1990   benign left breast needle biopsy  Dr. Margot Chimes  . CARPAL TUNNEL RELEASE  04/07/2009   right Dr. Daylene Katayama  . CATARACT EXTRACTION W/ INTRAOCULAR LENS  IMPLANT, BILATERAL Bilateral   . TRIGGER FINGER RELEASE Left 08/09/2014   Procedure: RELEASE TRIGGER FINGER/A-1 PULLEY LEFT MIDDLE FINGER;  Surgeon: Daryll Brod, MD;  Location: [facility];  Service: Orthopedics;  Laterality: Left;  REGIONAL/FAB    Allergies  Allergen Reactions  . Avelox [Moxifloxacin Hcl In Nacl] Itching  . Doxycycline   . Hista-Tabs [Triprolidine-Pse] Other (See Comments)    Passed out  . Septra [Sulfamethoxazole-Trimethoprim]   . Sulfa Antibiotics     Itching/rash (all over)    Outpatient Encounter Prescriptions as of 08/28/2016  Medication Sig  . acetaminophen (TYLENOL) 325 MG tablet Take 2 tablets (650 mg total) by mouth every 6 (six) hours as  needed for mild pain or moderate pain.  Marland Kitchen albuterol (VENTOLIN HFA) 108 (90 Base) MCG/ACT inhaler Inhale 2 puffs by mouth every 6 hours as needed for shortness of breath  . feeding supplement (BOOST HIGH PROTEIN) LIQD Take 1 Container by mouth 2 (two) times daily  between meals.   . fluticasone (FLONASE) 50 MCG/ACT nasal spray Place 2 sprays into both nostrils daily.  . Fluticasone-Salmeterol (ADVAIR DISKUS) 250-50 MCG/DOSE AEPB Inhale once daily to help breathing  . furosemide (LASIX) 40 MG tablet Take 40 mg by mouth every morning.  Marland Kitchen ipratropium (ATROVENT) 0.02 % nebulizer solution Take 0.5 mg by nebulization 3 (three) times daily. 8 AM, 1 PM and 6 PM. Stop date 08/31/16  . levothyroxine (SYNTHROID, LEVOTHROID) 25 MCG tablet Take 25 mcg by mouth daily before breakfast.  . meloxicam (MOBIC) 7.5 MG tablet Take 7.5 mg by mouth daily as needed.   . mirtazapine (REMERON) 15 MG tablet Take 15 mg by mouth at bedtime.  . montelukast (SINGULAIR) 10 MG tablet Take one tablet by mouth once daily to help breathing  . MYRBETRIQ 25 MG TB24 tablet TAKE ONE TABLET BY MOUTH ONCE DAILY  . predniSONE (DELTASONE) 5 MG tablet Take 5 mg by mouth daily with breakfast.  . promethazine (PHENERGAN) 12.5 MG suppository Place 12.5 mg rectally every 4 (four) hours as needed for nausea or vomiting.  . raloxifene (EVISTA) 60 MG tablet Take 1 tablet by mouth  daily to treat osteoporosis  . ranitidine (ZANTAC) 150 MG capsule Take 150 mg by mouth daily.  . sertraline (ZOLOFT) 50 MG tablet Take 50 mg by mouth daily.  Marland Kitchen ZINC OXIDE EX Apply 1 application topically 2 (two) times daily.    No facility-administered encounter medications on file as of 08/28/2016.     Review of Systems  Constitutional: Negative for appetite change, chills, diaphoresis, fatigue and fever.  HENT: Positive for congestion. Negative for mouth sores, rhinorrhea, sinus pain and sinus pressure.   Eyes: Positive for visual disturbance. Negative for pain, redness and itching.  Respiratory: Positive for cough, shortness of breath and wheezing.        Improved since last visit  Cardiovascular: Positive for leg swelling. Negative for chest pain and palpitations.  Gastrointestinal: Negative for abdominal pain,  constipation, diarrhea, nausea and vomiting.  Genitourinary: Negative for dysuria.  Musculoskeletal: Positive for gait problem. Negative for arthralgias and back pain.       Uses a walker  Neurological: Negative for dizziness, seizures, syncope, light-headedness, numbness and headaches.  Psychiatric/Behavioral: Positive for confusion and decreased concentration. Negative for agitation.    Immunization History  Administered Date(s) Administered  . Influenza Whole 12/27/2011, 11/05/2012  . Influenza-Unspecified 11/18/2013, 11/03/2014, 11/16/2015  . PPD Test 04/04/2008, 11/23/2014  . Pneumococcal Polysaccharide-23 02/05/2008, 06/03/2008  . Td 02/05/2008, 06/03/2008   Pertinent  Health Maintenance Due  Topic Date Due  . DEXA SCAN  02/04/2017 (Originally 04/20/1988)  . PNA vac Low Risk Adult (2 of 2 - PCV13) 02/04/2017 (Originally 06/03/2009)  . INFLUENZA VACCINE  09/04/2016   Fall Risk  05/30/2015 05/16/2015 04/25/2015 01/02/2015 09/06/2014  Falls in the past year? Yes No No Yes Yes  Number falls in past yr: 1 - - 1 1  Injury with Fall? Yes - - Yes Yes  Risk Factor Category  - - - High Fall Risk -  Risk for fall due to : History of fall(s);Impaired balance/gait;Impaired mobility - - History of fall(s) -  Follow up Falls evaluation completed;Education provided - -  Falls evaluation completed -   Functional Status Survey:    Vitals:   08/28/16 1206  BP: (!) 114/57  Pulse: (!) 54  Resp: 18  Temp: (!) 97.2 F (36.2 C)  TempSrc: Oral  SpO2: 91%  Weight: 124 lb (56.2 kg)  Height: 4\' 11"  (1.499 m)   Body mass index is 25.04 kg/m. Physical Exam  Constitutional: She appears well-developed and well-nourished. No distress.  HENT:  Head: Normocephalic and atraumatic.  Mouth/Throat: Oropharynx is clear and moist.  Eyes: Pupils are equal, round, and reactive to light. Conjunctivae are normal.  Neck: Normal range of motion. Neck supple. No JVD present.  Cardiovascular: Normal rate and  regular rhythm.   Pulmonary/Chest: Effort normal. No respiratory distress. She has wheezes. She has no rales. She exhibits no tenderness.  Decreased air entry to lung bases  Abdominal: Soft. Bowel sounds are normal. She exhibits no distension. There is no tenderness. There is no guarding.  Musculoskeletal:  Trace leg edema, can move all 4 extremities, uses a walker  Lymphadenopathy:    She has no cervical adenopathy.  Neurological: She is alert.  Oriented to person and place only  Skin: Skin is warm and dry. She is not diaphoretic.  Chronic skin changes to lower extremities  Psychiatric: She has a normal mood and affect.  Good eye contact    Labs reviewed:  Recent Labs  01/15/16 05/13/16 07/15/16  NA 142 140 140  K 3.9 3.9 3.9  BUN 21 16 14   CREATININE 1.0 0.9 0.9    Recent Labs  01/15/16 05/13/16 07/15/16  AST 17 19 16   ALT 14 16 11   ALKPHOS 49 46 50    Recent Labs  01/15/16 05/13/16 07/15/16  WBC 7.1 6.5 7.7  HGB 13.0 12.4 11.9*  HCT 40 38 36  PLT 283 312 278   Lab Results  Component Value Date   TSH 1.32 07/15/2016   Lab Results  Component Value Date   HGBA1C 6.1 05/13/2016   Lab Results  Component Value Date   CHOL 211 (A) 02/14/2014   HDL 47 02/14/2014   LDLCALC 133 02/14/2014   TRIG 155 02/14/2014    Significant Diagnostic Results in last 30 days:  No results found.  Assessment/Plan  Asthma exacerbation Clinically improving, continue and complete course of prednisone and scheduled duoneb treatment. Monitor clinically. o2 only as needed to keep o2 sat >90%. Chest xray is negative for infiltrates.   Family/ staff Communication: reviewed care plan with patient and charge nurse.   Labs/tests ordered:  Pending cbc with diff, cmp  Spent 30 minute in patient care with greater than 50% of total time spent reviewing result, examining pt and co-ordinating care with nursing staff.     Blanchie Serve, MD Internal Medicine [facility] [address] [place], [place] [address] Cell Phone (Monday-Friday 8 am - 5 pm): [phone] On Call: [phone] and follow prompts after 5 pm and on weekends Office Phone: [phone] Office Fax: [phone]

## 2016-08-29 DIAGNOSIS — R296 Repeated falls: Secondary | ICD-10-CM | POA: Diagnosis not present

## 2016-08-29 DIAGNOSIS — J45909 Unspecified asthma, uncomplicated: Secondary | ICD-10-CM | POA: Diagnosis not present

## 2016-08-29 DIAGNOSIS — M818 Other osteoporosis without current pathological fracture: Secondary | ICD-10-CM | POA: Diagnosis not present

## 2016-08-29 DIAGNOSIS — M6281 Muscle weakness (generalized): Secondary | ICD-10-CM | POA: Diagnosis not present

## 2016-08-29 DIAGNOSIS — R29898 Other symptoms and signs involving the musculoskeletal system: Secondary | ICD-10-CM | POA: Diagnosis not present

## 2016-08-29 DIAGNOSIS — N3281 Overactive bladder: Secondary | ICD-10-CM | POA: Diagnosis not present

## 2016-08-29 DIAGNOSIS — R2681 Unsteadiness on feet: Secondary | ICD-10-CM | POA: Diagnosis not present

## 2016-08-30 DIAGNOSIS — R296 Repeated falls: Secondary | ICD-10-CM | POA: Diagnosis not present

## 2016-08-30 DIAGNOSIS — R2681 Unsteadiness on feet: Secondary | ICD-10-CM | POA: Diagnosis not present

## 2016-08-30 DIAGNOSIS — R29898 Other symptoms and signs involving the musculoskeletal system: Secondary | ICD-10-CM | POA: Diagnosis not present

## 2016-08-30 DIAGNOSIS — M818 Other osteoporosis without current pathological fracture: Secondary | ICD-10-CM | POA: Diagnosis not present

## 2016-08-30 DIAGNOSIS — N3281 Overactive bladder: Secondary | ICD-10-CM | POA: Diagnosis not present

## 2016-08-30 DIAGNOSIS — M6281 Muscle weakness (generalized): Secondary | ICD-10-CM | POA: Diagnosis not present

## 2016-09-03 DIAGNOSIS — M6281 Muscle weakness (generalized): Secondary | ICD-10-CM | POA: Diagnosis not present

## 2016-09-03 DIAGNOSIS — M818 Other osteoporosis without current pathological fracture: Secondary | ICD-10-CM | POA: Diagnosis not present

## 2016-09-03 DIAGNOSIS — R296 Repeated falls: Secondary | ICD-10-CM | POA: Diagnosis not present

## 2016-09-03 DIAGNOSIS — N3281 Overactive bladder: Secondary | ICD-10-CM | POA: Diagnosis not present

## 2016-09-03 DIAGNOSIS — R29898 Other symptoms and signs involving the musculoskeletal system: Secondary | ICD-10-CM | POA: Diagnosis not present

## 2016-09-03 DIAGNOSIS — R2681 Unsteadiness on feet: Secondary | ICD-10-CM | POA: Diagnosis not present

## 2016-09-04 DIAGNOSIS — M818 Other osteoporosis without current pathological fracture: Secondary | ICD-10-CM | POA: Diagnosis not present

## 2016-09-04 DIAGNOSIS — R29898 Other symptoms and signs involving the musculoskeletal system: Secondary | ICD-10-CM | POA: Diagnosis not present

## 2016-09-04 DIAGNOSIS — R2681 Unsteadiness on feet: Secondary | ICD-10-CM | POA: Diagnosis not present

## 2016-09-04 DIAGNOSIS — R55 Syncope and collapse: Secondary | ICD-10-CM | POA: Diagnosis not present

## 2016-09-04 DIAGNOSIS — N3281 Overactive bladder: Secondary | ICD-10-CM | POA: Diagnosis not present

## 2016-09-04 DIAGNOSIS — M6281 Muscle weakness (generalized): Secondary | ICD-10-CM | POA: Diagnosis not present

## 2016-09-04 DIAGNOSIS — R296 Repeated falls: Secondary | ICD-10-CM | POA: Diagnosis not present

## 2016-09-05 DIAGNOSIS — M818 Other osteoporosis without current pathological fracture: Secondary | ICD-10-CM | POA: Diagnosis not present

## 2016-09-05 DIAGNOSIS — R29898 Other symptoms and signs involving the musculoskeletal system: Secondary | ICD-10-CM | POA: Diagnosis not present

## 2016-09-05 DIAGNOSIS — M6281 Muscle weakness (generalized): Secondary | ICD-10-CM | POA: Diagnosis not present

## 2016-09-05 DIAGNOSIS — R2681 Unsteadiness on feet: Secondary | ICD-10-CM | POA: Diagnosis not present

## 2016-09-05 DIAGNOSIS — R296 Repeated falls: Secondary | ICD-10-CM | POA: Diagnosis not present

## 2016-09-05 DIAGNOSIS — N3281 Overactive bladder: Secondary | ICD-10-CM | POA: Diagnosis not present

## 2016-09-06 DIAGNOSIS — M818 Other osteoporosis without current pathological fracture: Secondary | ICD-10-CM | POA: Diagnosis not present

## 2016-09-06 DIAGNOSIS — R2681 Unsteadiness on feet: Secondary | ICD-10-CM | POA: Diagnosis not present

## 2016-09-06 DIAGNOSIS — N3281 Overactive bladder: Secondary | ICD-10-CM | POA: Diagnosis not present

## 2016-09-06 DIAGNOSIS — M6281 Muscle weakness (generalized): Secondary | ICD-10-CM | POA: Diagnosis not present

## 2016-09-06 DIAGNOSIS — R296 Repeated falls: Secondary | ICD-10-CM | POA: Diagnosis not present

## 2016-09-06 DIAGNOSIS — R29898 Other symptoms and signs involving the musculoskeletal system: Secondary | ICD-10-CM | POA: Diagnosis not present

## 2016-09-09 DIAGNOSIS — R29898 Other symptoms and signs involving the musculoskeletal system: Secondary | ICD-10-CM | POA: Diagnosis not present

## 2016-09-09 DIAGNOSIS — R296 Repeated falls: Secondary | ICD-10-CM | POA: Diagnosis not present

## 2016-09-09 DIAGNOSIS — M818 Other osteoporosis without current pathological fracture: Secondary | ICD-10-CM | POA: Diagnosis not present

## 2016-09-09 DIAGNOSIS — R2681 Unsteadiness on feet: Secondary | ICD-10-CM | POA: Diagnosis not present

## 2016-09-09 DIAGNOSIS — M6281 Muscle weakness (generalized): Secondary | ICD-10-CM | POA: Diagnosis not present

## 2016-09-09 DIAGNOSIS — N3281 Overactive bladder: Secondary | ICD-10-CM | POA: Diagnosis not present

## 2016-09-10 DIAGNOSIS — M6281 Muscle weakness (generalized): Secondary | ICD-10-CM | POA: Diagnosis not present

## 2016-09-10 DIAGNOSIS — N3281 Overactive bladder: Secondary | ICD-10-CM | POA: Diagnosis not present

## 2016-09-10 DIAGNOSIS — R296 Repeated falls: Secondary | ICD-10-CM | POA: Diagnosis not present

## 2016-09-10 DIAGNOSIS — R2681 Unsteadiness on feet: Secondary | ICD-10-CM | POA: Diagnosis not present

## 2016-09-10 DIAGNOSIS — M818 Other osteoporosis without current pathological fracture: Secondary | ICD-10-CM | POA: Diagnosis not present

## 2016-09-10 DIAGNOSIS — R29898 Other symptoms and signs involving the musculoskeletal system: Secondary | ICD-10-CM | POA: Diagnosis not present

## 2016-09-11 DIAGNOSIS — R296 Repeated falls: Secondary | ICD-10-CM | POA: Diagnosis not present

## 2016-09-11 DIAGNOSIS — M6281 Muscle weakness (generalized): Secondary | ICD-10-CM | POA: Diagnosis not present

## 2016-09-11 DIAGNOSIS — R29898 Other symptoms and signs involving the musculoskeletal system: Secondary | ICD-10-CM | POA: Diagnosis not present

## 2016-09-11 DIAGNOSIS — N3281 Overactive bladder: Secondary | ICD-10-CM | POA: Diagnosis not present

## 2016-09-11 DIAGNOSIS — R2681 Unsteadiness on feet: Secondary | ICD-10-CM | POA: Diagnosis not present

## 2016-09-11 DIAGNOSIS — M818 Other osteoporosis without current pathological fracture: Secondary | ICD-10-CM | POA: Diagnosis not present

## 2016-09-13 DIAGNOSIS — R296 Repeated falls: Secondary | ICD-10-CM | POA: Diagnosis not present

## 2016-09-13 DIAGNOSIS — R2681 Unsteadiness on feet: Secondary | ICD-10-CM | POA: Diagnosis not present

## 2016-09-13 DIAGNOSIS — M6281 Muscle weakness (generalized): Secondary | ICD-10-CM | POA: Diagnosis not present

## 2016-09-13 DIAGNOSIS — N3281 Overactive bladder: Secondary | ICD-10-CM | POA: Diagnosis not present

## 2016-09-13 DIAGNOSIS — R29898 Other symptoms and signs involving the musculoskeletal system: Secondary | ICD-10-CM | POA: Diagnosis not present

## 2016-09-13 DIAGNOSIS — M818 Other osteoporosis without current pathological fracture: Secondary | ICD-10-CM | POA: Diagnosis not present

## 2016-09-17 DIAGNOSIS — R29898 Other symptoms and signs involving the musculoskeletal system: Secondary | ICD-10-CM | POA: Diagnosis not present

## 2016-09-17 DIAGNOSIS — N3281 Overactive bladder: Secondary | ICD-10-CM | POA: Diagnosis not present

## 2016-09-17 DIAGNOSIS — M6281 Muscle weakness (generalized): Secondary | ICD-10-CM | POA: Diagnosis not present

## 2016-09-17 DIAGNOSIS — R296 Repeated falls: Secondary | ICD-10-CM | POA: Diagnosis not present

## 2016-09-17 DIAGNOSIS — M818 Other osteoporosis without current pathological fracture: Secondary | ICD-10-CM | POA: Diagnosis not present

## 2016-09-17 DIAGNOSIS — R2681 Unsteadiness on feet: Secondary | ICD-10-CM | POA: Diagnosis not present

## 2016-09-18 DIAGNOSIS — R29898 Other symptoms and signs involving the musculoskeletal system: Secondary | ICD-10-CM | POA: Diagnosis not present

## 2016-09-18 DIAGNOSIS — N3281 Overactive bladder: Secondary | ICD-10-CM | POA: Diagnosis not present

## 2016-09-18 DIAGNOSIS — M6281 Muscle weakness (generalized): Secondary | ICD-10-CM | POA: Diagnosis not present

## 2016-09-18 DIAGNOSIS — R296 Repeated falls: Secondary | ICD-10-CM | POA: Diagnosis not present

## 2016-09-18 DIAGNOSIS — R2681 Unsteadiness on feet: Secondary | ICD-10-CM | POA: Diagnosis not present

## 2016-09-18 DIAGNOSIS — M818 Other osteoporosis without current pathological fracture: Secondary | ICD-10-CM | POA: Diagnosis not present

## 2016-09-20 DIAGNOSIS — N3281 Overactive bladder: Secondary | ICD-10-CM | POA: Diagnosis not present

## 2016-09-20 DIAGNOSIS — R296 Repeated falls: Secondary | ICD-10-CM | POA: Diagnosis not present

## 2016-09-20 DIAGNOSIS — M818 Other osteoporosis without current pathological fracture: Secondary | ICD-10-CM | POA: Diagnosis not present

## 2016-09-20 DIAGNOSIS — M6281 Muscle weakness (generalized): Secondary | ICD-10-CM | POA: Diagnosis not present

## 2016-09-20 DIAGNOSIS — R29898 Other symptoms and signs involving the musculoskeletal system: Secondary | ICD-10-CM | POA: Diagnosis not present

## 2016-09-20 DIAGNOSIS — R2681 Unsteadiness on feet: Secondary | ICD-10-CM | POA: Diagnosis not present

## 2016-09-23 ENCOUNTER — Non-Acute Institutional Stay: Payer: Medicare Other

## 2016-09-23 DIAGNOSIS — M818 Other osteoporosis without current pathological fracture: Secondary | ICD-10-CM | POA: Diagnosis not present

## 2016-09-23 DIAGNOSIS — R29898 Other symptoms and signs involving the musculoskeletal system: Secondary | ICD-10-CM | POA: Diagnosis not present

## 2016-09-23 DIAGNOSIS — Z0000 Encounter for general adult medical examination without abnormal findings: Secondary | ICD-10-CM

## 2016-09-23 DIAGNOSIS — R296 Repeated falls: Secondary | ICD-10-CM | POA: Diagnosis not present

## 2016-09-23 DIAGNOSIS — M6281 Muscle weakness (generalized): Secondary | ICD-10-CM | POA: Diagnosis not present

## 2016-09-23 DIAGNOSIS — N3281 Overactive bladder: Secondary | ICD-10-CM | POA: Diagnosis not present

## 2016-09-23 DIAGNOSIS — R2681 Unsteadiness on feet: Secondary | ICD-10-CM | POA: Diagnosis not present

## 2016-09-23 NOTE — Progress Notes (Signed)
Subjective:   Kimberly Greer is a 83 y.o. female who presents for an Initial Medicare Annual Wellness Visit at [facility]       Objective:    Today's Vitals   09/23/16 1342  BP: (!) 130/58  Pulse: (!) 53  Temp: (!) 97.4 F (36.3 C)  TempSrc: Oral  SpO2: 92%  Weight: 124 lb (56.2 kg)  Height: 4\' 11"  (1.499 m)   Body mass index is 25.04 kg/m.   Current Medications (verified) Outpatient Encounter Prescriptions as of 09/23/2016  Medication Sig  . acetaminophen (TYLENOL) 325 MG tablet Take 2 tablets (650 mg total) by mouth every 6 (six) hours as needed for mild pain or moderate pain.  Marland Kitchen albuterol (VENTOLIN HFA) 108 (90 Base) MCG/ACT inhaler Inhale 2 puffs by mouth every 6 hours as needed for shortness of breath  . feeding supplement (BOOST HIGH PROTEIN) LIQD Take 1 Container by mouth 2 (two) times daily between meals.   . fluticasone (FLONASE) 50 MCG/ACT nasal spray Place 2 sprays into both nostrils daily.  . Fluticasone-Salmeterol (ADVAIR DISKUS) 250-50 MCG/DOSE AEPB Inhale once daily to help breathing  . furosemide (LASIX) 40 MG tablet Take 40 mg by mouth every morning.  Marland Kitchen levothyroxine (SYNTHROID, LEVOTHROID) 25 MCG tablet Take 25 mcg by mouth daily before breakfast.  . mirtazapine (REMERON) 15 MG tablet Take 15 mg by mouth at bedtime.  . montelukast (SINGULAIR) 10 MG tablet Take one tablet by mouth once daily to help breathing  . MYRBETRIQ 25 MG TB24 tablet TAKE ONE TABLET BY MOUTH ONCE DAILY  . promethazine (PHENERGAN) 12.5 MG suppository Place 12.5 mg rectally every 4 (four) hours as needed for nausea or vomiting.  . raloxifene (EVISTA) 60 MG tablet Take 1 tablet by mouth  daily to treat osteoporosis  . ranitidine (ZANTAC) 150 MG capsule Take 150 mg by mouth daily.  . sertraline (ZOLOFT) 50 MG tablet Take 50 mg by mouth daily.  Marland Kitchen ZINC OXIDE EX Apply 1 application topically 2 (two) times daily.   . [DISCONTINUED] ipratropium (ATROVENT) 0.02 %  nebulizer solution Take 0.5 mg by nebulization 3 (three) times daily. 8 AM, 1 PM and 6 PM. Stop date 08/31/16  . [DISCONTINUED] meloxicam (MOBIC) 7.5 MG tablet Take 7.5 mg by mouth daily as needed.   . [DISCONTINUED] predniSONE (DELTASONE) 5 MG tablet Take 5 mg by mouth daily with breakfast.   No facility-administered encounter medications on file as of 09/23/2016.     Allergies (verified) Avelox [moxifloxacin hcl in nacl]; Doxycycline; Hista-tabs [triprolidine-pse]; Septra [sulfamethoxazole-trimethoprim]; and Sulfa antibiotics   History: Past Medical History:  Diagnosis Date  . Asthma   . Carpal tunnel syndrome 03/07/2009  . Disturbance of skin sensation 02/21/2009  . Diverticulosis of colon (without mention of hemorrhage) 02/16/2009  . Fracture of upper end of left humerus 11/18/14  . 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 Hypothyroid   . Osteoarthrosis, unspecified whether generalized or localized, unspecified site 02/16/2009  . Other abnormal blood chemistry 06/19/2010   hyperglycemia  . Other and unspecified hyperlipidemia 10/24/2009  . Other atopic dermatitis and related conditions 02/16/2009  . Otosclerosis, unspecified 02/22/2003  . Pain in limb 12/05/2009  . Senile osteoporosis 06/16/2010  . Sensorineural hearing loss, unspecified 02/21/2009  . Syncope and collapse 05/30/2009  . Unspecified nasal polyp 02/21/2009  . Unspecified urinary incontinence 02/21/2009   Past Surgical History:  Procedure Laterality Date  . ABDOMINAL HYSTERECTOMY  1999   Dr. Collier Bullock  . BREAST  BIOPSY  07/16/1990   benign left breast needle biopsy  Dr. Margot Chimes  . CARPAL TUNNEL RELEASE  04/07/2009   right Dr. Daylene Katayama  . CATARACT EXTRACTION W/ INTRAOCULAR LENS  IMPLANT, BILATERAL Bilateral   . TRIGGER FINGER RELEASE Left 08/09/2014   Procedure: RELEASE TRIGGER FINGER/A-1 PULLEY LEFT MIDDLE FINGER;  Surgeon: Daryll Brod, MD;  Location: [facility];  Service: Orthopedics;  Laterality: Left;  REGIONAL/FAB    Family History  Problem Relation Age of Onset  . Cancer Mother        stomach  . Cancer Father        bone  . Dementia Sister   . Cancer Brother        pancreataic  . Cancer Son        pancreatic   Social History   Occupational History  . retired Sales promotion account executive    Social History Main Topics  . Smoking status: Never Smoker  . Smokeless tobacco: Never Used  . Alcohol use No  . Drug use: No  . Sexual activity: No    Tobacco Counseling Counseling given: Not Answered   Activities of Daily Living In your present state of health, do you have any difficulty performing the following activities: 09/23/2016  Hearing? Y  Vision? N  Difficulty concentrating or making decisions? Y  Walking or climbing stairs? Y  Dressing or bathing? Y  Doing errands, shopping? Y  Preparing Food and eating ? Y  Using the Toilet? Y  In the past six months, have you accidently leaked urine? N  Do you have problems with loss of bowel control? N  Managing your Medications? Y  Managing your Finances? Y  Housekeeping or managing your Housekeeping? Y  Some recent data might be hidden    Immunizations and Health Maintenance Immunization History  Administered Date(s) Administered  . Influenza Whole 12/27/2011, 11/05/2012  . Influenza-Unspecified 11/18/2013, 11/03/2014, 11/16/2015  . PPD Test 04/04/2008, 11/23/2014  . Pneumococcal Polysaccharide-23 02/05/2008, 06/03/2008  . Td 02/05/2008, 06/03/2008   Health Maintenance Due  Topic Date Due  . INFLUENZA VACCINE  09/04/2016    Patient Care Team: Blanchie Serve, MD as PCP - General (Internal Medicine) Newton Pigg, MD as Consulting Physician (Obstetrics and Gynecology) Monna Fam, MD as Consulting Physician (Ophthalmology) Melida Quitter, MD as Consulting Physician (Otolaryngology) Myrlene Broker, MD as Consulting Physician (Urology) [place], [place], Nelda Bucks, NP as Nurse Practitioner (Family Medicine)  Indicate any  recent Medical Services you may have received from other than [facility] providers in the past year (date may be approximate).     Assessment:   This is a routine wellness examination for [facility].   Hearing/Vision screen No exam data present  Dietary issues and exercise activities discussed: Current Exercise Habits: Structured exercise class, Time (Minutes): 45, Frequency (Times/Week): 2, Weekly Exercise (Minutes/Week): 90, Intensity: Mild, Exercise limited by: None identified  Goals    None     Depression Screen PHQ 2/9 Scores 09/23/2016 05/30/2015 05/16/2015 01/02/2015 03/08/2014  PHQ - 2 Score 0 0 0 0 0    Fall Risk Fall Risk  09/23/2016 05/30/2015 05/16/2015 04/25/2015 01/02/2015  Falls in the past year? Yes Yes No No Yes  Number falls in past yr: 1 1 - - 1  Injury with Fall? No Yes - - Yes  Comment - - - - -  Risk Factor Category  - - - - High Fall Risk  Risk for fall due to : - History of fall(s);Impaired  balance/gait;Impaired mobility - - History of fall(s)  Follow up - Falls evaluation completed;Education provided - - Falls evaluation completed    Cognitive Function: MMSE - Mini Mental State Exam 09/23/2016 03/08/2014 10/27/2012 06/23/2012  Orientation to time 2 2 5 4   Orientation to Place 4 4 5 5   Registration 3 3 3 3   Attention/ Calculation 4 5 3 5   Recall 1 2 3 1   Language- name 2 objects 2 2 2 2   Language- repeat 1 1 1 1   Language- follow 3 step command 3 3 3 3   Language- read & follow direction 1 1 1 1   Write a sentence 1 1 1 1   Copy design 0 1 1 1   Total score 22 25 28 27         Screening Tests Health Maintenance  Topic Date Due  . INFLUENZA VACCINE  09/04/2016  . DEXA SCAN  02/04/2017 (Originally 04/20/1988)  . PNA vac Low Risk Adult (2 of 2 - PCV13) 02/04/2017 (Originally 06/03/2009)  . TETANUS/TDAP  06/04/2018      Plan:    I have personally reviewed and addressed the Medicare Annual Wellness questionnaire and have noted the following in the patient's chart:    A. Medical and social history B. Use of alcohol, tobacco or illicit drugs  C. Current medications and supplements D. Functional ability and status E.  Nutritional status F.  Physical activity G. Advance directives H. List of other physicians I.  Hospitalizations, surgeries, and ER visits in previous 12 months J.  [place] to include hearing, vision, cognitive, depression L. Referrals and appointments - none  In addition, I have reviewed and discussed with patient certain preventive protocols, quality metrics, and best practice recommendations. A written personalized care plan for preventive services as well as general preventive health recommendations were provided to patient.  See attached scanned questionnaire for additional information.   Signed,   Rich Reining, RN Nurse Health Advisor   Quick Notes   Health Maintenance: PNA 13 due     Abnormal Screen: MMSE 22/30. Did not pass clock drawing     Patient Concerns: None     Nurse Concerns: None

## 2016-09-23 NOTE — Patient Instructions (Signed)
[name] , Thank you for taking time to come for your Medicare Wellness Visit. I appreciate your ongoing commitment to your health goals. Please review the following plan we discussed and let me know if I can assist you in the future.   Screening recommendations/referrals: Colonoscopy excluded, pt over age 81 Mammogram excluded, pt over age 59 Bone Density up to date Recommended yearly ophthalmology/optometry visit for glaucoma screening and checkup Recommended yearly dental visit for hygiene and checkup  Vaccinations: Influenza vaccine due 2018 fall season Pneumococcal vaccine 13 due Tdap vaccine up to date. Due 06/01/08 Shingles vaccine not in records  Advanced directives: In Chart  Conditions/risks identified: None  Next appointment: Dr. Bubba Camp makes rounds   Preventive Care 56 Years and Older, Female Preventive care refers to lifestyle choices and visits with your health care provider that can promote health and wellness. What does preventive care include?  A yearly physical exam. This is also called an annual well check.  Dental exams once or twice a year.  Routine eye exams. Ask your health care provider how often you should have your eyes checked.  Personal lifestyle choices, including:  Daily care of your teeth and gums.  Regular physical activity.  Eating a healthy diet.  Avoiding tobacco and drug use.  Limiting alcohol use.  Practicing safe sex.  Taking low-dose aspirin every day.  Taking vitamin and mineral supplements as recommended by your health care provider. What happens during an annual well check? The services and screenings done by your health care provider during your annual well check will depend on your age, overall health, lifestyle risk factors, and family history of disease. Counseling  Your health care provider may ask you questions about your:  Alcohol use.  Tobacco use.  Drug use.  Emotional well-being.  Home and  relationship well-being.  Sexual activity.  Eating habits.  History of falls.  Memory and ability to understand (cognition).  Work and work Statistician.  Reproductive health. Screening  You may have the following tests or measurements:  Height, weight, and BMI.  Blood pressure.  Lipid and cholesterol levels. These may be checked every 5 years, or more frequently if you are over 82 years old.  Skin check.  Lung cancer screening. You may have this screening every year starting at age 42 if you have a 30-pack-year history of smoking and currently smoke or have quit within the past 15 years.  Fecal occult blood test (FOBT) of the stool. You may have this test every year starting at age 35.  Flexible sigmoidoscopy or colonoscopy. You may have a sigmoidoscopy every 5 years or a colonoscopy every 10 years starting at age 86.  Hepatitis C blood test.  Hepatitis B blood test.  Sexually transmitted disease (STD) testing.  Diabetes screening. This is done by checking your blood sugar (glucose) after you have not eaten for a while (fasting). You may have this done every 1-3 years.  Bone density scan. This is done to screen for osteoporosis. You may have this done starting at age 32.  Mammogram. This may be done every 1-2 years. Talk to your health care provider about how often you should have regular mammograms. Talk with your health care provider about your test results, treatment options, and if necessary, the need for more tests. Vaccines  Your health care provider may recommend certain vaccines, such as:  Influenza vaccine. This is recommended every year.  Tetanus, diphtheria, and acellular pertussis (Tdap, Td) vaccine. You may need a  Td booster every 10 years.  Zoster vaccine. You may need this after age 50.  Pneumococcal 13-valent conjugate (PCV13) vaccine. One dose is recommended after age 47.  Pneumococcal polysaccharide (PPSV23) vaccine. One dose is recommended after  age 46. Talk to your health care provider about which screenings and vaccines you need and how often you need them. This information is not intended to replace advice given to you by your health care provider. Make sure you discuss any questions you have with your health care provider. Document Released: 02/17/2015 Document Revised: 10/11/2015 Document Reviewed: 11/22/2014 Elsevier Interactive Patient Education  2017 [place] Prevention in the Home Falls can cause injuries. They can happen to people of all ages. There are many things you can do to make your home safe and to help prevent falls. What can I do on the outside of my home?  Regularly fix the edges of walkways and driveways and fix any cracks.  Remove anything that might make you trip as you walk through a door, such as a raised step or threshold.  Trim any bushes or trees on the path to your home.  Use bright outdoor lighting.  Clear any walking paths of anything that might make someone trip, such as rocks or tools.  Regularly check to see if handrails are loose or broken. Make sure that both sides of any steps have handrails.  Any raised decks and porches should have guardrails on the edges.  Have any leaves, snow, or ice cleared regularly.  Use sand or salt on walking paths during winter.  Clean up any spills in your garage right away. This includes oil or grease spills. What can I do in the bathroom?  Use night lights.  Install grab bars by the toilet and in the tub and shower. Do not use towel bars as grab bars.  Use non-skid mats or decals in the tub or shower.  If you need to sit down in the shower, use a plastic, non-slip stool.  Keep the floor dry. Clean up any water that spills on the floor as soon as it happens.  Remove soap buildup in the tub or shower regularly.  Attach bath mats securely with double-sided non-slip rug tape.  Do not have throw rugs and other things on the floor that can  make you trip. What can I do in the bedroom?  Use night lights.  Make sure that you have a light by your bed that is easy to reach.  Do not use any sheets or blankets that are too big for your bed. They should not hang down onto the floor.  Have a firm chair that has side arms. You can use this for support while you get dressed.  Do not have throw rugs and other things on the floor that can make you trip. What can I do in the kitchen?  Clean up any spills right away.  Avoid walking on wet floors.  Keep items that you use a lot in easy-to-reach places.  If you need to reach something above you, use a strong step stool that has a grab bar.  Keep electrical cords out of the way.  Do not use floor polish or wax that makes floors slippery. If you must use wax, use non-skid floor wax.  Do not have throw rugs and other things on the floor that can make you trip. What can I do with my stairs?  Do not leave any items on the stairs.  Make sure that there are handrails on both sides of the stairs and use them. Fix handrails that are broken or loose. Make sure that handrails are as long as the stairways.  Check any carpeting to make sure that it is firmly attached to the stairs. Fix any carpet that is loose or worn.  Avoid having throw rugs at the top or bottom of the stairs. If you do have throw rugs, attach them to the floor with carpet tape.  Make sure that you have a light switch at the top of the stairs and the bottom of the stairs. If you do not have them, ask someone to add them for you. What else can I do to help prevent falls?  Wear shoes that:  Do not have high heels.  Have rubber bottoms.  Are comfortable and fit you well.  Are closed at the toe. Do not wear sandals.  If you use a stepladder:  Make sure that it is fully opened. Do not climb a closed stepladder.  Make sure that both sides of the stepladder are locked into place.  Ask someone to hold it for you,  if possible.  Clearly mark and make sure that you can see:  Any grab bars or handrails.  First and last steps.  Where the edge of each step is.  Use tools that help you move around (mobility aids) if they are needed. These include:  Canes.  Walkers.  Scooters.  Crutches.  Turn on the lights when you go into a dark area. Replace any light bulbs as soon as they burn out.  Set up your furniture so you have a clear path. Avoid moving your furniture around.  If any of your floors are uneven, fix them.  If there are any pets around you, be aware of where they are.  Review your medicines with your doctor. Some medicines can make you feel dizzy. This can increase your chance of falling. Ask your doctor what other things that you can do to help prevent falls. This information is not intended to replace advice given to you by your health care provider. Make sure you discuss any questions you have with your health care provider. Document Released: 11/17/2008 Document Revised: 06/29/2015 Document Reviewed: 02/25/2014 Elsevier Interactive Patient Education  2017 [employer].

## 2016-09-24 DIAGNOSIS — R29898 Other symptoms and signs involving the musculoskeletal system: Secondary | ICD-10-CM | POA: Diagnosis not present

## 2016-09-24 DIAGNOSIS — M818 Other osteoporosis without current pathological fracture: Secondary | ICD-10-CM | POA: Diagnosis not present

## 2016-09-24 DIAGNOSIS — M6281 Muscle weakness (generalized): Secondary | ICD-10-CM | POA: Diagnosis not present

## 2016-09-24 DIAGNOSIS — N3281 Overactive bladder: Secondary | ICD-10-CM | POA: Diagnosis not present

## 2016-09-24 DIAGNOSIS — R296 Repeated falls: Secondary | ICD-10-CM | POA: Diagnosis not present

## 2016-09-24 DIAGNOSIS — R2681 Unsteadiness on feet: Secondary | ICD-10-CM | POA: Diagnosis not present

## 2016-09-25 DIAGNOSIS — R296 Repeated falls: Secondary | ICD-10-CM | POA: Diagnosis not present

## 2016-09-25 DIAGNOSIS — M818 Other osteoporosis without current pathological fracture: Secondary | ICD-10-CM | POA: Diagnosis not present

## 2016-09-25 DIAGNOSIS — M6281 Muscle weakness (generalized): Secondary | ICD-10-CM | POA: Diagnosis not present

## 2016-09-25 DIAGNOSIS — R2681 Unsteadiness on feet: Secondary | ICD-10-CM | POA: Diagnosis not present

## 2016-09-25 DIAGNOSIS — N3281 Overactive bladder: Secondary | ICD-10-CM | POA: Diagnosis not present

## 2016-09-25 DIAGNOSIS — R29898 Other symptoms and signs involving the musculoskeletal system: Secondary | ICD-10-CM | POA: Diagnosis not present

## 2016-09-26 DIAGNOSIS — M818 Other osteoporosis without current pathological fracture: Secondary | ICD-10-CM | POA: Diagnosis not present

## 2016-09-26 DIAGNOSIS — R29898 Other symptoms and signs involving the musculoskeletal system: Secondary | ICD-10-CM | POA: Diagnosis not present

## 2016-09-26 DIAGNOSIS — R296 Repeated falls: Secondary | ICD-10-CM | POA: Diagnosis not present

## 2016-09-26 DIAGNOSIS — M6281 Muscle weakness (generalized): Secondary | ICD-10-CM | POA: Diagnosis not present

## 2016-09-26 DIAGNOSIS — N390 Urinary tract infection, site not specified: Secondary | ICD-10-CM | POA: Diagnosis not present

## 2016-09-26 DIAGNOSIS — R2681 Unsteadiness on feet: Secondary | ICD-10-CM | POA: Diagnosis not present

## 2016-09-26 DIAGNOSIS — N3281 Overactive bladder: Secondary | ICD-10-CM | POA: Diagnosis not present

## 2016-09-27 DIAGNOSIS — M818 Other osteoporosis without current pathological fracture: Secondary | ICD-10-CM | POA: Diagnosis not present

## 2016-09-27 DIAGNOSIS — R296 Repeated falls: Secondary | ICD-10-CM | POA: Diagnosis not present

## 2016-09-27 DIAGNOSIS — R29898 Other symptoms and signs involving the musculoskeletal system: Secondary | ICD-10-CM | POA: Diagnosis not present

## 2016-09-27 DIAGNOSIS — R2681 Unsteadiness on feet: Secondary | ICD-10-CM | POA: Diagnosis not present

## 2016-09-27 DIAGNOSIS — M6281 Muscle weakness (generalized): Secondary | ICD-10-CM | POA: Diagnosis not present

## 2016-09-27 DIAGNOSIS — N3281 Overactive bladder: Secondary | ICD-10-CM | POA: Diagnosis not present

## 2016-09-30 DIAGNOSIS — N3281 Overactive bladder: Secondary | ICD-10-CM | POA: Diagnosis not present

## 2016-09-30 DIAGNOSIS — M818 Other osteoporosis without current pathological fracture: Secondary | ICD-10-CM | POA: Diagnosis not present

## 2016-09-30 DIAGNOSIS — R296 Repeated falls: Secondary | ICD-10-CM | POA: Diagnosis not present

## 2016-09-30 DIAGNOSIS — R2681 Unsteadiness on feet: Secondary | ICD-10-CM | POA: Diagnosis not present

## 2016-09-30 DIAGNOSIS — M6281 Muscle weakness (generalized): Secondary | ICD-10-CM | POA: Diagnosis not present

## 2016-09-30 DIAGNOSIS — R29898 Other symptoms and signs involving the musculoskeletal system: Secondary | ICD-10-CM | POA: Diagnosis not present

## 2016-10-01 DIAGNOSIS — M6281 Muscle weakness (generalized): Secondary | ICD-10-CM | POA: Diagnosis not present

## 2016-10-01 DIAGNOSIS — R29898 Other symptoms and signs involving the musculoskeletal system: Secondary | ICD-10-CM | POA: Diagnosis not present

## 2016-10-01 DIAGNOSIS — N3281 Overactive bladder: Secondary | ICD-10-CM | POA: Diagnosis not present

## 2016-10-01 DIAGNOSIS — R296 Repeated falls: Secondary | ICD-10-CM | POA: Diagnosis not present

## 2016-10-01 DIAGNOSIS — M818 Other osteoporosis without current pathological fracture: Secondary | ICD-10-CM | POA: Diagnosis not present

## 2016-10-01 DIAGNOSIS — R2681 Unsteadiness on feet: Secondary | ICD-10-CM | POA: Diagnosis not present

## 2016-10-02 DIAGNOSIS — N3281 Overactive bladder: Secondary | ICD-10-CM | POA: Diagnosis not present

## 2016-10-02 DIAGNOSIS — R29898 Other symptoms and signs involving the musculoskeletal system: Secondary | ICD-10-CM | POA: Diagnosis not present

## 2016-10-02 DIAGNOSIS — R2681 Unsteadiness on feet: Secondary | ICD-10-CM | POA: Diagnosis not present

## 2016-10-02 DIAGNOSIS — R296 Repeated falls: Secondary | ICD-10-CM | POA: Diagnosis not present

## 2016-10-02 DIAGNOSIS — M6281 Muscle weakness (generalized): Secondary | ICD-10-CM | POA: Diagnosis not present

## 2016-10-02 DIAGNOSIS — M818 Other osteoporosis without current pathological fracture: Secondary | ICD-10-CM | POA: Diagnosis not present

## 2016-10-03 DIAGNOSIS — R29898 Other symptoms and signs involving the musculoskeletal system: Secondary | ICD-10-CM | POA: Diagnosis not present

## 2016-10-03 DIAGNOSIS — N3281 Overactive bladder: Secondary | ICD-10-CM | POA: Diagnosis not present

## 2016-10-03 DIAGNOSIS — R2681 Unsteadiness on feet: Secondary | ICD-10-CM | POA: Diagnosis not present

## 2016-10-03 DIAGNOSIS — R296 Repeated falls: Secondary | ICD-10-CM | POA: Diagnosis not present

## 2016-10-03 DIAGNOSIS — M6281 Muscle weakness (generalized): Secondary | ICD-10-CM | POA: Diagnosis not present

## 2016-10-03 DIAGNOSIS — M818 Other osteoporosis without current pathological fracture: Secondary | ICD-10-CM | POA: Diagnosis not present

## 2016-10-04 DIAGNOSIS — M6281 Muscle weakness (generalized): Secondary | ICD-10-CM | POA: Diagnosis not present

## 2016-10-04 DIAGNOSIS — R29898 Other symptoms and signs involving the musculoskeletal system: Secondary | ICD-10-CM | POA: Diagnosis not present

## 2016-10-04 DIAGNOSIS — R296 Repeated falls: Secondary | ICD-10-CM | POA: Diagnosis not present

## 2016-10-04 DIAGNOSIS — R2681 Unsteadiness on feet: Secondary | ICD-10-CM | POA: Diagnosis not present

## 2016-10-04 DIAGNOSIS — M818 Other osteoporosis without current pathological fracture: Secondary | ICD-10-CM | POA: Diagnosis not present

## 2016-10-04 DIAGNOSIS — N3281 Overactive bladder: Secondary | ICD-10-CM | POA: Diagnosis not present

## 2016-10-08 ENCOUNTER — Other Ambulatory Visit: Payer: Self-pay | Admitting: *Deleted

## 2016-10-08 DIAGNOSIS — R2681 Unsteadiness on feet: Secondary | ICD-10-CM | POA: Diagnosis not present

## 2016-10-08 DIAGNOSIS — R55 Syncope and collapse: Secondary | ICD-10-CM | POA: Diagnosis not present

## 2016-10-08 DIAGNOSIS — R296 Repeated falls: Secondary | ICD-10-CM | POA: Diagnosis not present

## 2016-10-08 DIAGNOSIS — M818 Other osteoporosis without current pathological fracture: Secondary | ICD-10-CM | POA: Diagnosis not present

## 2016-10-08 DIAGNOSIS — R29898 Other symptoms and signs involving the musculoskeletal system: Secondary | ICD-10-CM | POA: Diagnosis not present

## 2016-10-08 DIAGNOSIS — N3281 Overactive bladder: Secondary | ICD-10-CM | POA: Diagnosis not present

## 2016-10-08 DIAGNOSIS — M6281 Muscle weakness (generalized): Secondary | ICD-10-CM | POA: Diagnosis not present

## 2016-10-09 DIAGNOSIS — R296 Repeated falls: Secondary | ICD-10-CM | POA: Diagnosis not present

## 2016-10-09 DIAGNOSIS — M6281 Muscle weakness (generalized): Secondary | ICD-10-CM | POA: Diagnosis not present

## 2016-10-09 DIAGNOSIS — M818 Other osteoporosis without current pathological fracture: Secondary | ICD-10-CM | POA: Diagnosis not present

## 2016-10-09 DIAGNOSIS — N3281 Overactive bladder: Secondary | ICD-10-CM | POA: Diagnosis not present

## 2016-10-09 DIAGNOSIS — R29898 Other symptoms and signs involving the musculoskeletal system: Secondary | ICD-10-CM | POA: Diagnosis not present

## 2016-10-09 DIAGNOSIS — R2681 Unsteadiness on feet: Secondary | ICD-10-CM | POA: Diagnosis not present

## 2016-10-11 DIAGNOSIS — M818 Other osteoporosis without current pathological fracture: Secondary | ICD-10-CM | POA: Diagnosis not present

## 2016-10-11 DIAGNOSIS — M6281 Muscle weakness (generalized): Secondary | ICD-10-CM | POA: Diagnosis not present

## 2016-10-11 DIAGNOSIS — R296 Repeated falls: Secondary | ICD-10-CM | POA: Diagnosis not present

## 2016-10-11 DIAGNOSIS — R29898 Other symptoms and signs involving the musculoskeletal system: Secondary | ICD-10-CM | POA: Diagnosis not present

## 2016-10-11 DIAGNOSIS — R2681 Unsteadiness on feet: Secondary | ICD-10-CM | POA: Diagnosis not present

## 2016-10-11 DIAGNOSIS — N3281 Overactive bladder: Secondary | ICD-10-CM | POA: Diagnosis not present

## 2016-10-15 DIAGNOSIS — R29898 Other symptoms and signs involving the musculoskeletal system: Secondary | ICD-10-CM | POA: Diagnosis not present

## 2016-10-15 DIAGNOSIS — R2681 Unsteadiness on feet: Secondary | ICD-10-CM | POA: Diagnosis not present

## 2016-10-15 DIAGNOSIS — M818 Other osteoporosis without current pathological fracture: Secondary | ICD-10-CM | POA: Diagnosis not present

## 2016-10-15 DIAGNOSIS — R296 Repeated falls: Secondary | ICD-10-CM | POA: Diagnosis not present

## 2016-10-15 DIAGNOSIS — N3281 Overactive bladder: Secondary | ICD-10-CM | POA: Diagnosis not present

## 2016-10-15 DIAGNOSIS — M6281 Muscle weakness (generalized): Secondary | ICD-10-CM | POA: Diagnosis not present

## 2016-10-16 DIAGNOSIS — M6281 Muscle weakness (generalized): Secondary | ICD-10-CM | POA: Diagnosis not present

## 2016-10-16 DIAGNOSIS — M818 Other osteoporosis without current pathological fracture: Secondary | ICD-10-CM | POA: Diagnosis not present

## 2016-10-16 DIAGNOSIS — R2681 Unsteadiness on feet: Secondary | ICD-10-CM | POA: Diagnosis not present

## 2016-10-16 DIAGNOSIS — R29898 Other symptoms and signs involving the musculoskeletal system: Secondary | ICD-10-CM | POA: Diagnosis not present

## 2016-10-16 DIAGNOSIS — R296 Repeated falls: Secondary | ICD-10-CM | POA: Diagnosis not present

## 2016-10-16 DIAGNOSIS — N3281 Overactive bladder: Secondary | ICD-10-CM | POA: Diagnosis not present

## 2016-10-17 DIAGNOSIS — M6281 Muscle weakness (generalized): Secondary | ICD-10-CM | POA: Diagnosis not present

## 2016-10-17 DIAGNOSIS — R29898 Other symptoms and signs involving the musculoskeletal system: Secondary | ICD-10-CM | POA: Diagnosis not present

## 2016-10-17 DIAGNOSIS — N3281 Overactive bladder: Secondary | ICD-10-CM | POA: Diagnosis not present

## 2016-10-17 DIAGNOSIS — R2681 Unsteadiness on feet: Secondary | ICD-10-CM | POA: Diagnosis not present

## 2016-10-17 DIAGNOSIS — R296 Repeated falls: Secondary | ICD-10-CM | POA: Diagnosis not present

## 2016-10-17 DIAGNOSIS — M818 Other osteoporosis without current pathological fracture: Secondary | ICD-10-CM | POA: Diagnosis not present

## 2016-10-21 DIAGNOSIS — R2681 Unsteadiness on feet: Secondary | ICD-10-CM | POA: Diagnosis not present

## 2016-10-21 DIAGNOSIS — N3281 Overactive bladder: Secondary | ICD-10-CM | POA: Diagnosis not present

## 2016-10-21 DIAGNOSIS — M6281 Muscle weakness (generalized): Secondary | ICD-10-CM | POA: Diagnosis not present

## 2016-10-21 DIAGNOSIS — M818 Other osteoporosis without current pathological fracture: Secondary | ICD-10-CM | POA: Diagnosis not present

## 2016-10-21 DIAGNOSIS — R29898 Other symptoms and signs involving the musculoskeletal system: Secondary | ICD-10-CM | POA: Diagnosis not present

## 2016-10-21 DIAGNOSIS — R296 Repeated falls: Secondary | ICD-10-CM | POA: Diagnosis not present

## 2016-10-22 DIAGNOSIS — M6281 Muscle weakness (generalized): Secondary | ICD-10-CM | POA: Diagnosis not present

## 2016-10-22 DIAGNOSIS — R296 Repeated falls: Secondary | ICD-10-CM | POA: Diagnosis not present

## 2016-10-22 DIAGNOSIS — N3281 Overactive bladder: Secondary | ICD-10-CM | POA: Diagnosis not present

## 2016-10-22 DIAGNOSIS — R2681 Unsteadiness on feet: Secondary | ICD-10-CM | POA: Diagnosis not present

## 2016-10-22 DIAGNOSIS — M818 Other osteoporosis without current pathological fracture: Secondary | ICD-10-CM | POA: Diagnosis not present

## 2016-10-22 DIAGNOSIS — R29898 Other symptoms and signs involving the musculoskeletal system: Secondary | ICD-10-CM | POA: Diagnosis not present

## 2016-10-23 DIAGNOSIS — N3281 Overactive bladder: Secondary | ICD-10-CM | POA: Diagnosis not present

## 2016-10-23 DIAGNOSIS — M6281 Muscle weakness (generalized): Secondary | ICD-10-CM | POA: Diagnosis not present

## 2016-10-23 DIAGNOSIS — R29898 Other symptoms and signs involving the musculoskeletal system: Secondary | ICD-10-CM | POA: Diagnosis not present

## 2016-10-23 DIAGNOSIS — R296 Repeated falls: Secondary | ICD-10-CM | POA: Diagnosis not present

## 2016-10-23 DIAGNOSIS — R2681 Unsteadiness on feet: Secondary | ICD-10-CM | POA: Diagnosis not present

## 2016-10-23 DIAGNOSIS — M818 Other osteoporosis without current pathological fracture: Secondary | ICD-10-CM | POA: Diagnosis not present

## 2016-10-24 DIAGNOSIS — R296 Repeated falls: Secondary | ICD-10-CM | POA: Diagnosis not present

## 2016-10-24 DIAGNOSIS — R2681 Unsteadiness on feet: Secondary | ICD-10-CM | POA: Diagnosis not present

## 2016-10-24 DIAGNOSIS — N3281 Overactive bladder: Secondary | ICD-10-CM | POA: Diagnosis not present

## 2016-10-24 DIAGNOSIS — R29898 Other symptoms and signs involving the musculoskeletal system: Secondary | ICD-10-CM | POA: Diagnosis not present

## 2016-10-24 DIAGNOSIS — M818 Other osteoporosis without current pathological fracture: Secondary | ICD-10-CM | POA: Diagnosis not present

## 2016-10-24 DIAGNOSIS — M6281 Muscle weakness (generalized): Secondary | ICD-10-CM | POA: Diagnosis not present

## 2016-10-25 DIAGNOSIS — M6281 Muscle weakness (generalized): Secondary | ICD-10-CM | POA: Diagnosis not present

## 2016-10-25 DIAGNOSIS — R29898 Other symptoms and signs involving the musculoskeletal system: Secondary | ICD-10-CM | POA: Diagnosis not present

## 2016-10-25 DIAGNOSIS — R296 Repeated falls: Secondary | ICD-10-CM | POA: Diagnosis not present

## 2016-10-25 DIAGNOSIS — M818 Other osteoporosis without current pathological fracture: Secondary | ICD-10-CM | POA: Diagnosis not present

## 2016-10-25 DIAGNOSIS — N3281 Overactive bladder: Secondary | ICD-10-CM | POA: Diagnosis not present

## 2016-10-25 DIAGNOSIS — R2681 Unsteadiness on feet: Secondary | ICD-10-CM | POA: Diagnosis not present

## 2016-10-28 DIAGNOSIS — R29898 Other symptoms and signs involving the musculoskeletal system: Secondary | ICD-10-CM | POA: Diagnosis not present

## 2016-10-28 DIAGNOSIS — M818 Other osteoporosis without current pathological fracture: Secondary | ICD-10-CM | POA: Diagnosis not present

## 2016-10-28 DIAGNOSIS — R296 Repeated falls: Secondary | ICD-10-CM | POA: Diagnosis not present

## 2016-10-28 DIAGNOSIS — R2681 Unsteadiness on feet: Secondary | ICD-10-CM | POA: Diagnosis not present

## 2016-10-28 DIAGNOSIS — M6281 Muscle weakness (generalized): Secondary | ICD-10-CM | POA: Diagnosis not present

## 2016-10-28 DIAGNOSIS — N3281 Overactive bladder: Secondary | ICD-10-CM | POA: Diagnosis not present

## 2016-10-29 DIAGNOSIS — M6281 Muscle weakness (generalized): Secondary | ICD-10-CM | POA: Diagnosis not present

## 2016-10-29 DIAGNOSIS — N3281 Overactive bladder: Secondary | ICD-10-CM | POA: Diagnosis not present

## 2016-10-29 DIAGNOSIS — R2681 Unsteadiness on feet: Secondary | ICD-10-CM | POA: Diagnosis not present

## 2016-10-29 DIAGNOSIS — R29898 Other symptoms and signs involving the musculoskeletal system: Secondary | ICD-10-CM | POA: Diagnosis not present

## 2016-10-29 DIAGNOSIS — R296 Repeated falls: Secondary | ICD-10-CM | POA: Diagnosis not present

## 2016-10-29 DIAGNOSIS — M818 Other osteoporosis without current pathological fracture: Secondary | ICD-10-CM | POA: Diagnosis not present

## 2016-11-01 DIAGNOSIS — R296 Repeated falls: Secondary | ICD-10-CM | POA: Diagnosis not present

## 2016-11-01 DIAGNOSIS — R2681 Unsteadiness on feet: Secondary | ICD-10-CM | POA: Diagnosis not present

## 2016-11-01 DIAGNOSIS — R29898 Other symptoms and signs involving the musculoskeletal system: Secondary | ICD-10-CM | POA: Diagnosis not present

## 2016-11-01 DIAGNOSIS — M818 Other osteoporosis without current pathological fracture: Secondary | ICD-10-CM | POA: Diagnosis not present

## 2016-11-01 DIAGNOSIS — M6281 Muscle weakness (generalized): Secondary | ICD-10-CM | POA: Diagnosis not present

## 2016-11-01 DIAGNOSIS — N3281 Overactive bladder: Secondary | ICD-10-CM | POA: Diagnosis not present

## 2016-11-04 DIAGNOSIS — R296 Repeated falls: Secondary | ICD-10-CM | POA: Diagnosis not present

## 2016-11-04 DIAGNOSIS — Z9181 History of falling: Secondary | ICD-10-CM | POA: Diagnosis not present

## 2016-11-04 DIAGNOSIS — R2681 Unsteadiness on feet: Secondary | ICD-10-CM | POA: Diagnosis not present

## 2016-11-04 DIAGNOSIS — R29898 Other symptoms and signs involving the musculoskeletal system: Secondary | ICD-10-CM | POA: Diagnosis not present

## 2016-11-04 DIAGNOSIS — R55 Syncope and collapse: Secondary | ICD-10-CM | POA: Diagnosis not present

## 2016-11-04 DIAGNOSIS — N3281 Overactive bladder: Secondary | ICD-10-CM | POA: Diagnosis not present

## 2016-11-04 DIAGNOSIS — M818 Other osteoporosis without current pathological fracture: Secondary | ICD-10-CM | POA: Diagnosis not present

## 2016-11-04 DIAGNOSIS — M6281 Muscle weakness (generalized): Secondary | ICD-10-CM | POA: Diagnosis not present

## 2016-11-05 DIAGNOSIS — R296 Repeated falls: Secondary | ICD-10-CM | POA: Diagnosis not present

## 2016-11-05 DIAGNOSIS — R29898 Other symptoms and signs involving the musculoskeletal system: Secondary | ICD-10-CM | POA: Diagnosis not present

## 2016-11-05 DIAGNOSIS — Z9181 History of falling: Secondary | ICD-10-CM | POA: Diagnosis not present

## 2016-11-05 DIAGNOSIS — M6281 Muscle weakness (generalized): Secondary | ICD-10-CM | POA: Diagnosis not present

## 2016-11-05 DIAGNOSIS — R2681 Unsteadiness on feet: Secondary | ICD-10-CM | POA: Diagnosis not present

## 2016-11-05 DIAGNOSIS — M818 Other osteoporosis without current pathological fracture: Secondary | ICD-10-CM | POA: Diagnosis not present

## 2016-11-07 DIAGNOSIS — M818 Other osteoporosis without current pathological fracture: Secondary | ICD-10-CM | POA: Diagnosis not present

## 2016-11-07 DIAGNOSIS — R29898 Other symptoms and signs involving the musculoskeletal system: Secondary | ICD-10-CM | POA: Diagnosis not present

## 2016-11-07 DIAGNOSIS — M6281 Muscle weakness (generalized): Secondary | ICD-10-CM | POA: Diagnosis not present

## 2016-11-07 DIAGNOSIS — R2681 Unsteadiness on feet: Secondary | ICD-10-CM | POA: Diagnosis not present

## 2016-11-07 DIAGNOSIS — Z9181 History of falling: Secondary | ICD-10-CM | POA: Diagnosis not present

## 2016-11-07 DIAGNOSIS — R296 Repeated falls: Secondary | ICD-10-CM | POA: Diagnosis not present

## 2016-11-11 ENCOUNTER — Encounter: Payer: Self-pay | Admitting: Family

## 2016-11-11 ENCOUNTER — Non-Acute Institutional Stay: Payer: Medicare Other | Admitting: Family

## 2016-11-11 DIAGNOSIS — R0689 Other abnormalities of breathing: Secondary | ICD-10-CM | POA: Diagnosis not present

## 2016-11-11 NOTE — Progress Notes (Signed)
Location:  [facility] Room Number: 11 Place of Service:  ALF [phone]) Provider:  Krystel Fletchall, Nelda Bucks NP  Blanchie Serve, MD  Patient Care Team: Blanchie Serve, MD as PCP - General (Internal Medicine) Newton Pigg, MD as Consulting Physician (Obstetrics and Gynecology) Monna Fam, MD as Consulting Physician (Ophthalmology) Melida Quitter, MD as Consulting Physician (Otolaryngology) Myrlene Broker, MD as Consulting Physician (Urology) Melina Modena, [place], Nelda Bucks, NP as Nurse Practitioner (Family Medicine)  Extended Emergency Contact Information Primary Emergency Contact: Mottram,[name] Address: [address], [place] of [place] Phone: [phone] Work Phone: [phone] Mobile Phone: [phone] Relation: Son Secondary Emergency Contact: Teofilo Pod [place] of [place] Phone: [phone] Work Phone: [phone] Relation: Friend  Code Status:  DNR Goals of care: Advanced Directive information Advanced Directives 11/11/2016  Does Patient Have a Medical Advance Directive? -  Type of Paramedic of [place];Living will;Out of facility DNR (pink MOST or yellow form)  Does patient want to make changes to medical advance directive? No - Patient declined  Copy of [place] in Chart? Yes  Pre-existing out of facility DNR order (yellow form or pink MOST form) Yellow form placed in chart (order not valid for inpatient use);Pink MOST form placed in chart (order not valid for inpatient use)     Chief Complaint  Patient presents with  . Acute Visit    cough    HPI:  Pt is a 83 y.o. female seen today at [facility] for an acute visit for evaluation of abnormal breath sounds. She is seen in her room today per facility Nurse request.Facility Nurse reports patient upper lung lobes with rhonchi sounds. She denies any fever or shortness of breath.Of note  she has a medical history of asthma.    Past Medical History:  Diagnosis Date  . Asthma   . Carpal tunnel syndrome 03/07/2009  . Disturbance of skin sensation 02/21/2009  . Diverticulosis of colon (without mention of hemorrhage) 02/16/2009  . Fracture of upper end of left humerus 11/18/14  . 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 Hypothyroid   . Osteoarthrosis, unspecified whether generalized or localized, unspecified site 02/16/2009  . Other abnormal blood chemistry 06/19/2010   hyperglycemia  . Other and unspecified hyperlipidemia 10/24/2009  . Other atopic dermatitis and related conditions 02/16/2009  . Otosclerosis, unspecified 02/22/2003  . Pain in limb 12/05/2009  . Senile osteoporosis 06/16/2010  . Sensorineural hearing loss, unspecified 02/21/2009  . Syncope and collapse 05/30/2009  . Unspecified nasal polyp 02/21/2009  . Unspecified urinary incontinence 02/21/2009   Past Surgical History:  Procedure Laterality Date  . ABDOMINAL HYSTERECTOMY  1999   Dr. Collier Bullock  . BREAST BIOPSY  07/16/1990   benign left breast needle biopsy  Dr. Margot Chimes  . CARPAL TUNNEL RELEASE  04/07/2009   right Dr. Daylene Katayama  . CATARACT EXTRACTION W/ INTRAOCULAR LENS  IMPLANT, BILATERAL Bilateral   . TRIGGER FINGER RELEASE Left 08/09/2014   Procedure: RELEASE TRIGGER FINGER/A-1 PULLEY LEFT MIDDLE FINGER;  Surgeon: Daryll Brod, MD;  Location: [facility];  Service: Orthopedics;  Laterality: Left;  REGIONAL/FAB    Allergies  Allergen Reactions  . Avelox [Moxifloxacin Hcl In Nacl] Itching  . Doxycycline   . Hista-Tabs [Triprolidine-Pse] Other (See Comments)    Passed out  . Septra [Sulfamethoxazole-Trimethoprim]   . Sulfa Antibiotics     Itching/rash (  all over)    Outpatient Encounter Prescriptions as of 11/11/2016  Medication Sig  . acetaminophen (TYLENOL) 325 MG tablet Take 2 tablets (650 mg total) by mouth every 6 (six) hours as needed for mild pain or moderate pain.  Marland Kitchen albuterol (VENTOLIN HFA) 108  (90 Base) MCG/ACT inhaler Inhale 2 puffs by mouth every 6 hours as needed for shortness of breath  . feeding supplement (BOOST HIGH PROTEIN) LIQD Take 1 Container by mouth 2 (two) times daily between meals.   . fluticasone (FLONASE) 50 MCG/ACT nasal spray Place 2 sprays into both nostrils daily.  . Fluticasone-Salmeterol (ADVAIR DISKUS) 250-50 MCG/DOSE AEPB Inhale once daily to help breathing  . furosemide (LASIX) 40 MG tablet Take 40 mg by mouth every morning.  Marland Kitchen levothyroxine (SYNTHROID, LEVOTHROID) 25 MCG tablet Take 25 mcg by mouth daily before breakfast.  . mirtazapine (REMERON) 15 MG tablet Take 15 mg by mouth at bedtime.  . montelukast (SINGULAIR) 10 MG tablet Take one tablet by mouth once daily to help breathing  . MYRBETRIQ 25 MG TB24 tablet TAKE ONE TABLET BY MOUTH ONCE DAILY  . promethazine (PHENERGAN) 12.5 MG suppository Place 12.5 mg rectally every 4 (four) hours as needed for nausea or vomiting.  . raloxifene (EVISTA) 60 MG tablet Take 1 tablet by mouth  daily to treat osteoporosis  . ranitidine (ZANTAC) 150 MG capsule Take 150 mg by mouth daily.  . sertraline (ZOLOFT) 50 MG tablet Take 50 mg by mouth daily.  Marland Kitchen ZINC OXIDE EX Apply 1 application topically 2 (two) times daily.    No facility-administered encounter medications on file as of 11/11/2016.     Review of Systems  Constitutional: Negative for activity change, appetite change, chills, fatigue and fever.  HENT: Positive for hearing loss. Negative for congestion, postnasal drip, rhinorrhea, sinus pain, sinus pressure, sneezing and sore throat.   Eyes: Negative for discharge, redness and itching.  Respiratory: Positive for cough. Negative for chest tightness, shortness of breath and wheezing.   Cardiovascular: Negative for chest pain, palpitations and leg swelling.  Gastrointestinal: Negative for abdominal distention, abdominal pain, constipation, diarrhea, nausea and vomiting.  Skin: Negative for color change and pallor.    Neurological: Negative for dizziness, seizures, light-headedness and headaches.  Psychiatric/Behavioral: Negative for agitation, confusion and sleep disturbance. The patient is not nervous/anxious.     Immunization History  Administered Date(s) Administered  . Influenza Whole 12/27/2011, 11/05/2012  . Influenza-Unspecified 11/18/2013, 11/03/2014, 11/16/2015  . PPD Test 04/04/2008, 11/23/2014  . Pneumococcal Polysaccharide-23 02/05/2008, 06/03/2008  . Td 02/05/2008, 06/03/2008   Pertinent  Health Maintenance Due  Topic Date Due  . INFLUENZA VACCINE  09/04/2016  . PNA vac Low Risk Adult (2 of 2 - PCV13) 02/04/2017 (Originally 06/03/2009)  . DEXA SCAN  Completed   Fall Risk  09/23/2016 05/30/2015 05/16/2015 04/25/2015 01/02/2015  Falls in the past year? Yes Yes No No Yes  Number falls in past yr: 1 1 - - 1  Injury with Fall? No Yes - - Yes  Comment - - - - -  Risk Factor Category  - - - - High Fall Risk  Risk for fall due to : - History of fall(s);Impaired balance/gait;Impaired mobility - - History of fall(s)  Follow up - Falls evaluation completed;Education provided - - Falls evaluation completed    Vitals:   11/11/16 1545  BP: (!) 156/60  Pulse: 60  Resp: 18  Temp: 98.1 F (36.7 C)  SpO2: 92%  Weight: 126 lb (57.2 kg)  Height: 4\' 11"  (1.499 m)   Body mass index is 25.45 kg/m. Physical Exam  Constitutional: She is oriented to person, place, and time. She appears well-developed and well-nourished. No distress.  Elderly   HENT:  Head: Normocephalic.  Right Ear: External ear normal.  Left Ear: External ear normal.  Mouth/Throat: Oropharynx is clear and moist. No oropharyngeal exudate.  Eyes: Pupils are equal, round, and reactive to light. Conjunctivae and EOM are normal. Right eye exhibits no discharge. Left eye exhibits no discharge. No scleral icterus.  Neck: Normal range of motion. No JVD present. No thyromegaly present.  Cardiovascular: Normal rate, regular rhythm,  normal heart sounds and intact distal pulses.  Exam reveals no gallop and no friction rub.   No murmur heard. Pulmonary/Chest: Effort normal. No respiratory distress. She exhibits no tenderness.  Bilateral upper lobes expiration wheezes and left mid lobe rales.   Abdominal: Soft. Bowel sounds are normal. She exhibits no distension. There is no tenderness. There is no rebound and no guarding.  Musculoskeletal: She exhibits no edema or tenderness.  Unsteady gait uses front wheel walker   Lymphadenopathy:    She has no cervical adenopathy.  Neurological: She is oriented to person, place, and time. Coordination normal.  Skin: Skin is warm and dry. No rash noted. No erythema. No pallor.  Psychiatric: She has a normal mood and affect.   Labs reviewed:  Recent Labs  01/15/16 05/13/16 07/15/16  NA 142 140 140  K 3.9 3.9 3.9  BUN 21 16 14   CREATININE 1.0 0.9 0.9    Recent Labs  01/15/16 05/13/16 07/15/16  AST 17 19 16   ALT 14 16 11   ALKPHOS 49 46 50    Recent Labs  01/15/16 05/13/16 07/15/16  WBC 7.1 6.5 7.7  HGB 13.0 12.4 11.9*  HCT 40 38 36  PLT 283 312 278   Lab Results  Component Value Date   TSH 1.32 07/15/2016   Lab Results  Component Value Date   HGBA1C 6.1 05/13/2016   Lab Results  Component Value Date   CHOL 211 (A) 02/14/2014   HDL 47 02/14/2014   LDLCALC 133 02/14/2014   TRIG 155 02/14/2014    Significant Diagnostic Results in last 30 days:  No results found.  Assessment/Plan Abnormal breath sounds  Afebrile. Bilateral upper lobe expiration wheezes and left lobe rales noted.start Duoneb treatment every 6 hours while awake x 5 days. Portable CXR Pa/lat rule out pneumonia.    Family/ staff Communication: Plan of care reviewed with patient and facility Nurse   Labs/tests ordered: Portable chest X-ray Pa/Lateral rule out PNA

## 2016-11-12 DIAGNOSIS — M818 Other osteoporosis without current pathological fracture: Secondary | ICD-10-CM | POA: Diagnosis not present

## 2016-11-12 DIAGNOSIS — Z9181 History of falling: Secondary | ICD-10-CM | POA: Diagnosis not present

## 2016-11-12 DIAGNOSIS — R29898 Other symptoms and signs involving the musculoskeletal system: Secondary | ICD-10-CM | POA: Diagnosis not present

## 2016-11-12 DIAGNOSIS — R2681 Unsteadiness on feet: Secondary | ICD-10-CM | POA: Diagnosis not present

## 2016-11-12 DIAGNOSIS — R296 Repeated falls: Secondary | ICD-10-CM | POA: Diagnosis not present

## 2016-11-12 DIAGNOSIS — M6281 Muscle weakness (generalized): Secondary | ICD-10-CM | POA: Diagnosis not present

## 2016-11-13 DIAGNOSIS — R296 Repeated falls: Secondary | ICD-10-CM | POA: Diagnosis not present

## 2016-11-13 DIAGNOSIS — R2681 Unsteadiness on feet: Secondary | ICD-10-CM | POA: Diagnosis not present

## 2016-11-13 DIAGNOSIS — Z9181 History of falling: Secondary | ICD-10-CM | POA: Diagnosis not present

## 2016-11-13 DIAGNOSIS — M6281 Muscle weakness (generalized): Secondary | ICD-10-CM | POA: Diagnosis not present

## 2016-11-13 DIAGNOSIS — M818 Other osteoporosis without current pathological fracture: Secondary | ICD-10-CM | POA: Diagnosis not present

## 2016-11-13 DIAGNOSIS — R29898 Other symptoms and signs involving the musculoskeletal system: Secondary | ICD-10-CM | POA: Diagnosis not present

## 2016-11-15 DIAGNOSIS — R2681 Unsteadiness on feet: Secondary | ICD-10-CM | POA: Diagnosis not present

## 2016-11-15 DIAGNOSIS — M818 Other osteoporosis without current pathological fracture: Secondary | ICD-10-CM | POA: Diagnosis not present

## 2016-11-15 DIAGNOSIS — R296 Repeated falls: Secondary | ICD-10-CM | POA: Diagnosis not present

## 2016-11-15 DIAGNOSIS — R29898 Other symptoms and signs involving the musculoskeletal system: Secondary | ICD-10-CM | POA: Diagnosis not present

## 2016-11-15 DIAGNOSIS — M6281 Muscle weakness (generalized): Secondary | ICD-10-CM | POA: Diagnosis not present

## 2016-11-15 DIAGNOSIS — Z9181 History of falling: Secondary | ICD-10-CM | POA: Diagnosis not present

## 2016-11-18 ENCOUNTER — Encounter: Payer: Self-pay | Admitting: Family

## 2016-11-18 ENCOUNTER — Non-Acute Institutional Stay: Payer: Medicare Other | Admitting: Family

## 2016-11-18 DIAGNOSIS — F329 Major depressive disorder, single episode, unspecified: Secondary | ICD-10-CM

## 2016-11-18 DIAGNOSIS — R05 Cough: Secondary | ICD-10-CM

## 2016-11-18 DIAGNOSIS — F341 Dysthymic disorder: Secondary | ICD-10-CM | POA: Diagnosis not present

## 2016-11-18 DIAGNOSIS — R059 Cough, unspecified: Secondary | ICD-10-CM

## 2016-11-18 MED ORDER — SERTRALINE HCL 25 MG PO TABS: 25.0000 mg | ORAL_TABLET | Freq: Every day | ORAL | Status: DC

## 2016-11-18 MED ORDER — SERTRALINE HCL 50 MG PO TABS: 75.0000 mg | ORAL_TABLET | Freq: Every day | ORAL | Status: DC

## 2016-11-18 NOTE — Progress Notes (Addendum)
Location:  [facility] Room Number: 11 Place of Service:  ALF [phone]) Provider: Kolbie Clarkston FNP-C  Blanchie Serve, MD  Patient Care Team: Blanchie Serve, MD as PCP - General (Internal Medicine) Newton Pigg, MD as Consulting Physician (Obstetrics and Gynecology) Monna Fam, MD as Consulting Physician (Ophthalmology) Melida Quitter, MD as Consulting Physician (Otolaryngology) Myrlene Broker, MD as Consulting Physician (Urology) Melina Modena, [place], Nelda Bucks, NP as Nurse Practitioner (Family Medicine)  Extended Emergency Contact Information Primary Emergency Contact: Sabia,[name] Address: [place], [place] of [place] Phone: [phone] Work Phone: [phone] Mobile Phone: [phone] Relation: Son Secondary Emergency Contact: Teofilo Pod [place] of [place] Phone: [phone] Work Phone: [phone] Relation: Friend  Code Status:  DNR Goals of care: Advanced Directive information Advanced Directives 11/18/2016  Does Patient Have a Medical Advance Directive? Yes  Type of Advance Directive Out of facility DNR (pink MOST or yellow form);[place];Living will  Does patient want to make changes to medical advance directive? -  Copy of [place] in Chart? -  Pre-existing out of facility DNR order (yellow form or pink MOST form) -     Chief Complaint  Patient presents with  . Acute Visit    change in mental status (refusing meds, talking about shooting herself)    HPI:  Pt is a 83 y.o. female seen today at [facility] for an acute visit for evaluation of refusal of medication and suicide ideation. She has a significant medical history of Asthma, major depression among other condition.she I seen in her room today per facility Nurse request. Nurse reports patient has non-productive cough and congestion has been refusing to use her  Nebulizer breathing treatment as scheduled. Also states patient asked CNA if she had a gun,she would use it on herself. Patient stated to provider that sometimes feels down and lonely. "All my family members live far away in [place] so I don't have anyone here except my friends from church that come to visit". She also states " I wonder why God still has him here". She denies any suicide ideation states even if I had a gun I wouldn't know how to use it. She enjoys watching TV in her room though turns it off at times due to so many sad things happening in the world. Also enjoys reading and Music.She prefers to eat in her room " At the dinning room everyone is hear of hearing so everyone eats in silence so what's the use of going to the dinning". Of note patient has worked with occupational therapy but was discharge due to refusal to participation.  Oxygen saturations in the low 90's  Past Medical History:  Diagnosis Date  . Asthma   . Carpal tunnel syndrome 03/07/2009  . Disturbance of skin sensation 02/21/2009  . Diverticulosis of colon (without mention of hemorrhage) 02/16/2009  . Fracture of upper end of left humerus 11/18/14  . 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 Hypothyroid   . Osteoarthrosis, unspecified whether generalized or localized, unspecified site 02/16/2009  . Other abnormal blood chemistry 06/19/2010   hyperglycemia  . Other and unspecified hyperlipidemia 10/24/2009  . Other atopic dermatitis and related conditions 02/16/2009  . Otosclerosis, unspecified 02/22/2003  . Pain in limb 12/05/2009  . Senile osteoporosis 06/16/2010  . Sensorineural hearing loss, unspecified 02/21/2009  . Syncope and collapse 05/30/2009  .  Unspecified nasal polyp 02/21/2009  . Unspecified urinary incontinence 02/21/2009   Past Surgical History:  Procedure Laterality Date  . ABDOMINAL HYSTERECTOMY  1999   Dr. Collier Bullock  . BREAST BIOPSY  07/16/1990   benign left breast needle biopsy  Dr. Margot Chimes  . CARPAL TUNNEL  RELEASE  04/07/2009   right Dr. Daylene Katayama  . CATARACT EXTRACTION W/ INTRAOCULAR LENS  IMPLANT, BILATERAL Bilateral   . TRIGGER FINGER RELEASE Left 08/09/2014   Procedure: RELEASE TRIGGER FINGER/A-1 PULLEY LEFT MIDDLE FINGER;  Surgeon: Daryll Brod, MD;  Location: [facility];  Service: Orthopedics;  Laterality: Left;  REGIONAL/FAB    Allergies  Allergen Reactions  . Avelox [Moxifloxacin Hcl In Nacl] Itching  . Doxycycline   . Hista-Tabs [Triprolidine-Pse] Other (See Comments)    Passed out  . Septra [Sulfamethoxazole-Trimethoprim]   . Sulfa Antibiotics     Itching/rash (all over)    Outpatient Encounter Prescriptions as of 11/18/2016  Medication Sig  . acetaminophen (TYLENOL) 325 MG tablet Take 2 tablets (650 mg total) by mouth every 6 (six) hours as needed for mild pain or moderate pain.  Marland Kitchen albuterol (VENTOLIN HFA) 108 (90 Base) MCG/ACT inhaler Inhale 2 puffs by mouth every 6 hours as needed for shortness of breath  . feeding supplement (BOOST HIGH PROTEIN) LIQD Take 1 Container by mouth 2 (two) times daily between meals.   . fluticasone (FLONASE) 50 MCG/ACT nasal spray Place 2 sprays into both nostrils daily.  . Fluticasone-Salmeterol (ADVAIR DISKUS) 250-50 MCG/DOSE AEPB Inhale once daily to help breathing  . furosemide (LASIX) 40 MG tablet Take 40 mg by mouth every morning.  Marland Kitchen levothyroxine (SYNTHROID, LEVOTHROID) 25 MCG tablet Take 25 mcg by mouth daily before breakfast.  . mirtazapine (REMERON) 15 MG tablet Take 15 mg by mouth at bedtime.  . montelukast (SINGULAIR) 10 MG tablet Take one tablet by mouth once daily to help breathing  . MYRBETRIQ 25 MG TB24 tablet TAKE ONE TABLET BY MOUTH ONCE DAILY  . OXYGEN Inhale 2 L/min into the lungs as needed. Use as needed to keep O2 sats > 90%  . promethazine (PHENERGAN) 12.5 MG suppository Place 12.5 mg rectally every 4 (four) hours as needed for nausea or vomiting.  . raloxifene (EVISTA) 60 MG tablet Take 1 tablet by mouth  daily to  treat osteoporosis  . ranitidine (ZANTAC) 150 MG capsule Take 150 mg by mouth daily.  . sertraline (ZOLOFT) 50 MG tablet Take 1.5 tablets (75 mg total) by mouth daily. Take along with 25 mg Tablet daily  . ZINC OXIDE EX Apply 1 application topically 2 (two) times daily.   . [DISCONTINUED] sertraline (ZOLOFT) 50 MG tablet Take 50 mg by mouth daily.  . sertraline (ZOLOFT) 25 MG tablet Take 1 tablet (25 mg total) by mouth daily. Take along with 50 mg tablet daily   No facility-administered encounter medications on file as of 11/18/2016.     Review of Systems  Constitutional: Negative for activity change, appetite change, chills, fatigue and fever.  HENT: Negative for congestion, rhinorrhea, sinus pain, sinus pressure, sneezing and sore throat.   Eyes: Negative for pain, discharge and redness.  Respiratory: Positive for cough. Negative for chest tightness, shortness of breath and wheezing.   Cardiovascular: Negative for chest pain, palpitations and leg swelling.  Gastrointestinal: Negative for abdominal distention, abdominal pain, constipation, diarrhea, nausea and vomiting.  Endocrine: Negative for cold intolerance, heat intolerance, polydipsia, polyphagia and polyuria.  Genitourinary: Negative for dysuria, flank pain, frequency and urgency.  Musculoskeletal: Positive for gait problem.  Skin: Negative for color change, pallor and rash.  Psychiatric/Behavioral: Negative for agitation, confusion and sleep disturbance. The patient is not nervous/anxious.        Depression symptoms as per HPI     Immunization History  Administered Date(s) Administered  . Influenza Whole 12/27/2011, 11/05/2012  . Influenza-Unspecified 11/18/2013, 11/03/2014, 11/16/2015, 11/13/2016  . PPD Test 04/04/2008, 11/23/2014  . Pneumococcal Conjugate-13 09/30/2016  . Pneumococcal Polysaccharide-23 02/05/2008, 06/03/2008  . Td 02/05/2008, 06/03/2008   Pertinent  Health Maintenance Due  Topic Date Due  . INFLUENZA  VACCINE  09/04/2016  . PNA vac Low Risk Adult (2 of 2 - PCV13) 02/04/2017 (Originally 06/03/2009)  . DEXA SCAN  Completed   Fall Risk  09/23/2016 05/30/2015 05/16/2015 04/25/2015 01/02/2015  Falls in the past year? Yes Yes No No Yes  Number falls in past yr: 1 1 - - 1  Injury with Fall? No Yes - - Yes  Comment - - - - -  Risk Factor Category  - - - - High Fall Risk  Risk for fall due to : - History of fall(s);Impaired balance/gait;Impaired mobility - - History of fall(s)  Follow up - Falls evaluation completed;Education provided - - Falls evaluation completed   Vitals:   11/18/16 1049  BP: (!) 148/62  Pulse: 67  Resp: 18  Temp: (!) 97.1 F (36.2 C)  SpO2: 90%  Weight: 125 lb 8 oz (56.9 kg)  Height: 4\' 11"  (1.499 m)   Body mass index is 25.35 kg/m. Physical Exam  Constitutional: She is oriented to person, place, and time. She appears well-developed and well-nourished.  Elderly in no acute distress   HENT:  Head: Normocephalic.  Mouth/Throat: No oropharyngeal exudate.  Eyes: Pupils are equal, round, and reactive to light. Conjunctivae and EOM are normal. Right eye exhibits no discharge. Left eye exhibits no discharge. No scleral icterus.  Neck: Normal range of motion. No JVD present. No thyromegaly present.  Cardiovascular: Normal rate, regular rhythm, normal heart sounds and intact distal pulses.  Exam reveals no gallop and no friction rub.   No murmur heard. Pulmonary/Chest: Effort normal and breath sounds normal. No respiratory distress. She has no wheezes. She has no rales.  Abdominal: Soft. Bowel sounds are normal. She exhibits no distension. There is no tenderness. There is no rebound and no guarding.  Lymphadenopathy:    She has no cervical adenopathy.  Neurological: She is oriented to person, place, and time. Coordination normal.  Skin: Skin is warm and dry. No rash noted. No erythema. No pallor.  Psychiatric: Her behavior is normal.  Despair. In the room with lights off  and door closed.No suicide ideation during visit    Labs reviewed:  Recent Labs  01/15/16 05/13/16 07/15/16  NA 142 140 140  K 3.9 3.9 3.9  BUN 21 16 14   CREATININE 1.0 0.9 0.9    Recent Labs  01/15/16 05/13/16 07/15/16  AST 17 19 16   ALT 14 16 11   ALKPHOS 49 46 50    Recent Labs  01/15/16 05/13/16 07/15/16  WBC 7.1 6.5 7.7  HGB 13.0 12.4 11.9*  HCT 40 38 36  PLT 283 312 278   Lab Results  Component Value Date   TSH 1.32 07/15/2016   Lab Results  Component Value Date   HGBA1C 6.1 05/13/2016   Lab Results  Component Value Date   CHOL 211 (A) 02/14/2014   HDL 47 02/14/2014   LDLCALC 133 02/14/2014  TRIG 155 02/14/2014    Significant Diagnostic Results in last 30 days:  No results found.  Assessment/Plan  1. Cough Non-productive cough.Upper lobe rales and diminished wheezes. Has refused recent prescribed Duonebs treatment.Obtain portable CXR Pa/Lat rule out Pneumonia.   2. Major depression, chronic Reported to CNA that she would use a gun on herself if she had one.No suicide ideation during visit though signs of despair and depression noted. In the room with lights off and door closed.does not participate in facility activities. Has worked with occupation therapy but refused to participate. Increase Sertraline 50 mg Tablet daily Take along with sertraline 25 mg tablet daily. Psychiatry service to evaluate.continue to monitor for mood changes.   Family/ staff Communication: Reviewed plan of care with patient and facility Nurse supervisor.   Labs/tests ordered:portable CXR Pa/Lat rule out Pneumonia.    Sandrea Hughs, NP

## 2016-11-19 DIAGNOSIS — R2681 Unsteadiness on feet: Secondary | ICD-10-CM | POA: Diagnosis not present

## 2016-11-19 DIAGNOSIS — M818 Other osteoporosis without current pathological fracture: Secondary | ICD-10-CM | POA: Diagnosis not present

## 2016-11-19 DIAGNOSIS — M6281 Muscle weakness (generalized): Secondary | ICD-10-CM | POA: Diagnosis not present

## 2016-11-19 DIAGNOSIS — R29898 Other symptoms and signs involving the musculoskeletal system: Secondary | ICD-10-CM | POA: Diagnosis not present

## 2016-11-19 DIAGNOSIS — R296 Repeated falls: Secondary | ICD-10-CM | POA: Diagnosis not present

## 2016-11-19 DIAGNOSIS — Z9181 History of falling: Secondary | ICD-10-CM | POA: Diagnosis not present

## 2016-11-20 DIAGNOSIS — R2681 Unsteadiness on feet: Secondary | ICD-10-CM | POA: Diagnosis not present

## 2016-11-20 DIAGNOSIS — R296 Repeated falls: Secondary | ICD-10-CM | POA: Diagnosis not present

## 2016-11-20 DIAGNOSIS — Z9181 History of falling: Secondary | ICD-10-CM | POA: Diagnosis not present

## 2016-11-20 DIAGNOSIS — M6281 Muscle weakness (generalized): Secondary | ICD-10-CM | POA: Diagnosis not present

## 2016-11-20 DIAGNOSIS — M818 Other osteoporosis without current pathological fracture: Secondary | ICD-10-CM | POA: Diagnosis not present

## 2016-11-20 DIAGNOSIS — R29898 Other symptoms and signs involving the musculoskeletal system: Secondary | ICD-10-CM | POA: Diagnosis not present

## 2016-11-21 DIAGNOSIS — F322 Major depressive disorder, single episode, severe without psychotic features: Secondary | ICD-10-CM | POA: Diagnosis not present

## 2016-11-25 ENCOUNTER — Non-Acute Institutional Stay: Payer: Medicare Other | Admitting: Family

## 2016-11-25 ENCOUNTER — Encounter: Payer: Self-pay | Admitting: Family

## 2016-11-25 DIAGNOSIS — K219 Gastro-esophageal reflux disease without esophagitis: Secondary | ICD-10-CM

## 2016-11-25 DIAGNOSIS — E039 Hypothyroidism, unspecified: Secondary | ICD-10-CM | POA: Diagnosis not present

## 2016-11-25 DIAGNOSIS — R609 Edema, unspecified: Secondary | ICD-10-CM | POA: Diagnosis not present

## 2016-11-25 DIAGNOSIS — F341 Dysthymic disorder: Secondary | ICD-10-CM

## 2016-11-25 DIAGNOSIS — F329 Major depressive disorder, single episode, unspecified: Secondary | ICD-10-CM

## 2016-11-25 NOTE — Progress Notes (Signed)
Location:  [facility] Room Number: 11 Place of Service:  ALF [phone]) Provider: Talea Manges FNP-C   Blanchie Serve, MD  Patient Care Team: Blanchie Serve, MD as PCP - General (Internal Medicine) Newton Pigg, MD as Consulting Physician (Obstetrics and Gynecology) Monna Fam, MD as Consulting Physician (Ophthalmology) Melida Quitter, MD as Consulting Physician (Otolaryngology) Myrlene Broker, MD as Consulting Physician (Urology) Melina Modena, [place], Nelda Bucks, NP as Nurse Practitioner (Family Medicine)  Extended Emergency Contact Information Primary Emergency Contact: Horen,[name] Address: [place], [place] Phone: [phone] Work Phone: [phone] Mobile Phone: [phone] Relation: Son Secondary Emergency Contact: Teofilo Pod [place] of [place] Phone: [phone] Work Phone: [phone] Relation: Friend  Code Status:  DNR Goals of care: Advanced Directive information Advanced Directives 11/25/2016  Does Patient Have a Medical Advance Directive? Yes  Type of Advance Directive Out of facility DNR (pink MOST or yellow form);[place];Living will  Does patient want to make changes to medical advance directive? -  Copy of [place] in Chart? Yes  Pre-existing out of facility DNR order (yellow form or pink MOST form) Yellow form placed in chart (order not valid for inpatient use);Pink MOST form placed in chart (order not valid for inpatient use)     Chief Complaint  Patient presents with  . Medical Management of Chronic Issues    routine visit    HPI:  Pt is a 83 y.o. female seen today [facility] for medical management of chronic diseases.she has a medical history of Asthma,Hypothyroidism,depression, GERD,OA,OAB,osteoporosis among other conditions. She is seen in her room today. She denies any acute issues this  visit.she was seen recently by Psychiatry service for depression. Her sertraline was discontinued and Celexa 5 mg Tablet daily x 7 days then increase to 10 mg tablet initiated. She states still feels depression occasionally but symptoms have improved. Facility Nurse reports no new concerns. No recent fall episodes or weight changes.     Past Medical History:  Diagnosis Date  . Asthma   . Carpal tunnel syndrome 03/07/2009  . Disturbance of skin sensation 02/21/2009  . Diverticulosis of colon (without mention of hemorrhage) 02/16/2009  . Fracture of upper end of left humerus 11/18/14  . 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 Hypothyroid   . Osteoarthrosis, unspecified whether generalized or localized, unspecified site 02/16/2009  . Other abnormal blood chemistry 06/19/2010   hyperglycemia  . Other and unspecified hyperlipidemia 10/24/2009  . Other atopic dermatitis and related conditions 02/16/2009  . Otosclerosis, unspecified 02/22/2003  . Pain in limb 12/05/2009  . Senile osteoporosis 06/16/2010  . Sensorineural hearing loss, unspecified 02/21/2009  . Syncope and collapse 05/30/2009  . Unspecified nasal polyp 02/21/2009  . Unspecified urinary incontinence 02/21/2009   Past Surgical History:  Procedure Laterality Date  . ABDOMINAL HYSTERECTOMY  1999   Dr. Collier Bullock  . BREAST BIOPSY  07/16/1990   benign left breast needle biopsy  Dr. Margot Chimes  . CARPAL TUNNEL RELEASE  04/07/2009   right Dr. Daylene Katayama  . CATARACT EXTRACTION W/ INTRAOCULAR LENS  IMPLANT, BILATERAL Bilateral   . TRIGGER FINGER RELEASE Left 08/09/2014   Procedure: RELEASE TRIGGER FINGER/A-1 PULLEY LEFT MIDDLE FINGER;  Surgeon: Daryll Brod, MD;  Location: [facility];  Service: Orthopedics;  Laterality: Left;  REGIONAL/FAB    Allergies  Allergen Reactions  . Avelox [Moxifloxacin  Hcl In Nacl] Itching  . Doxycycline   . Hista-Tabs [Triprolidine-Pse] Other (See Comments)    Passed out  . Septra [Sulfamethoxazole-Trimethoprim]   .  Sulfa Antibiotics     Itching/rash (all over)    Allergies as of 11/25/2016      Reactions   Avelox [moxifloxacin Hcl In Nacl] Itching   Doxycycline    Hista-tabs [triprolidine-pse] Other (See Comments)   Passed out   Septra [sulfamethoxazole-trimethoprim]    Sulfa Antibiotics    Itching/rash (all over)      Medication List       Accurate as of 11/25/16  3:44 PM. Always use your most recent med list.          acetaminophen 325 MG tablet Commonly known as:  TYLENOL Take 2 tablets (650 mg total) by mouth every 6 (six) hours as needed for mild pain or moderate pain.   escitalopram 5 MG tablet Commonly known as:  LEXAPRO Take 5 mg by mouth daily.   escitalopram 10 MG tablet Commonly known as:  LEXAPRO Take 10 mg by mouth daily. Start taking on:  11/30/2016   feeding supplement Liqd Take 1 Container by mouth 2 (two) times daily between meals.   fluticasone 50 MCG/ACT nasal spray Commonly known as:  FLONASE Place 2 sprays into both nostrils daily.   Fluticasone-Salmeterol 250-50 MCG/DOSE Aepb Commonly known as:  ADVAIR DISKUS Inhale once daily to help breathing   furosemide 40 MG tablet Commonly known as:  LASIX Take 40 mg by mouth every morning.   levothyroxine 25 MCG tablet Commonly known as:  SYNTHROID, LEVOTHROID Take 25 mcg by mouth daily before breakfast.   mirtazapine 15 MG tablet Commonly known as:  REMERON Take 15 mg by mouth at bedtime.   montelukast 10 MG tablet Commonly known as:  SINGULAIR Take one tablet by mouth once daily to help breathing   MYRBETRIQ 25 MG Tb24 tablet Generic drug:  mirabegron ER TAKE ONE TABLET BY MOUTH ONCE DAILY   OXYGEN Inhale 2 L/min into the lungs as needed. Use as needed to keep O2 sats > 90%   promethazine 12.5 MG suppository Commonly known as:  PHENERGAN Place 12.5 mg rectally every 4 (four) hours as needed for nausea or vomiting.   raloxifene 60 MG tablet Commonly known as:  EVISTA Take 1 tablet by mouth   daily to treat osteoporosis   ranitidine 150 MG capsule Commonly known as:  ZANTAC Take 150 mg by mouth daily.   VENTOLIN HFA 108 (90 Base) MCG/ACT inhaler Generic drug:  albuterol Inhale 2 puffs by mouth every 6 hours as needed for shortness of breath   ZINC OXIDE EX Apply 1 application topically 2 (two) times daily.       Review of Systems  Constitutional: Negative for activity change, appetite change, chills, fatigue and fever.  HENT: Negative for congestion, rhinorrhea, sinus pain, sinus pressure, sneezing and sore throat.   Eyes: Negative for redness and itching.  Respiratory: Negative for cough, chest tightness and shortness of breath.   Cardiovascular: Positive for leg swelling. Negative for chest pain and palpitations.  Gastrointestinal: Negative for abdominal distention, abdominal pain, constipation, diarrhea, nausea and vomiting.  Endocrine: Negative for cold intolerance, heat intolerance, polydipsia, polyphagia and polyuria.  Genitourinary: Negative for dysuria, flank pain, frequency and urgency.  Musculoskeletal: Positive for gait problem.  Skin: Negative for color change, pallor, rash and wound.  Neurological: Negative for dizziness, seizures, syncope, light-headedness and headaches.  Hematological: Does not bruise/bleed easily.  Psychiatric/Behavioral: Negative for agitation, confusion and sleep disturbance. The patient is not nervous/anxious.     Immunization History  Administered Date(s) Administered  . Influenza Whole 12/27/2011, 11/05/2012  . Influenza-Unspecified 11/18/2013, 11/03/2014, 11/16/2015, 11/13/2016  . PPD Test 04/04/2008, 11/23/2014  . Pneumococcal Conjugate-13 09/30/2016  . Pneumococcal Polysaccharide-23 02/05/2008, 06/03/2008  . Td 02/05/2008, 06/03/2008   Pertinent  Health Maintenance Due  Topic Date Due  . INFLUENZA VACCINE  Completed  . DEXA SCAN  Completed  . PNA vac Low Risk Adult  Completed   Fall Risk  09/23/2016 05/30/2015  05/16/2015 04/25/2015 01/02/2015  Falls in the past year? Yes Yes No No Yes  Number falls in past yr: 1 1 - - 1  Injury with Fall? No Yes - - Yes  Comment - - - - -  Risk Factor Category  - - - - High Fall Risk  Risk for fall due to : - History of fall(s);Impaired balance/gait;Impaired mobility - - History of fall(s)  Follow up - Falls evaluation completed;Education provided - - Falls evaluation completed    Vitals:   11/25/16 1053  BP: (!) 144/80  Pulse: 84  Resp: 20  Temp: (!) 97.5 F (36.4 C)  SpO2: 91%  Weight: 126 lb (57.2 kg)  Height: 4\' 11"  (1.499 m)   Body mass index is 25.45 kg/m. Physical Exam  Constitutional: She is oriented to person, place, and time. She appears well-developed and well-nourished.  Elderly in no acute distress   HENT:  Head: Normocephalic.  Mouth/Throat: Oropharynx is clear and moist. No oropharyngeal exudate.  Eyes: Pupils are equal, round, and reactive to light. Conjunctivae and EOM are normal. Right eye exhibits no discharge.  Neck: Normal range of motion. No JVD present. No thyromegaly present.  Cardiovascular: Normal rate, regular rhythm, normal heart sounds and intact distal pulses.  Exam reveals no gallop and no friction rub.   No murmur heard. Pulmonary/Chest: Effort normal and breath sounds normal. No respiratory distress. She has no rales.  Diminished expiration wheezes noted   Abdominal: Soft. Bowel sounds are normal. She exhibits no distension. There is no tenderness. There is no rebound and no guarding.  Musculoskeletal:  Unsteady gait uses FWW. Bilateral lower extremities trace -1+ edema. Knee high ted hose in place.   Lymphadenopathy:    She has no cervical adenopathy.  Neurological: She is oriented to person, place, and time. Coordination normal.  Skin: Skin is warm and dry. No rash noted. No erythema. No pallor.  Psychiatric: She has a normal mood and affect.   Labs reviewed:  Recent Labs  01/15/16 05/13/16 07/15/16  NA 142  140 140  K 3.9 3.9 3.9  BUN 21 16 14   CREATININE 1.0 0.9 0.9    Recent Labs  01/15/16 05/13/16 07/15/16  AST 17 19 16   ALT 14 16 11   ALKPHOS 49 46 50    Recent Labs  01/15/16 05/13/16 07/15/16  WBC 7.1 6.5 7.7  HGB 13.0 12.4 11.9*  HCT 40 38 36  PLT 283 312 278   Lab Results  Component Value Date   TSH 1.32 07/15/2016   Lab Results  Component Value Date   HGBA1C 6.1 05/13/2016   Lab Results  Component Value Date   CHOL 211 (A) 02/14/2014   HDL 47 02/14/2014   LDLCALC 133 02/14/2014   TRIG 155 02/14/2014    Significant Diagnostic Results in last 30 days:  No results found.   Assessment/Plan 1. Hypothyroidism Lab Results  Component Value Date  TSH 1.32 07/15/2016  Continue on levothyroxine 25 mcg tablet daily.monitor TSH level.   2. Gastroesophageal reflux disease without esophagitis Zantac 150 mg tablet daily effective.   3. Major depression, chronic Reports occasional depression symptoms. No suicidal ideation. Psychiatry service recently D/c sertraline and initiated Lexapro. Continue to monitor for mood changes.    4. Edema Bilateral lower extremities trace -1+ edema. Continue on knee high ted hose on in the morning and off at bedtime. Continue to furosemide 40 mg tablet daily. Monitor weight.   Family/ staff Communication: Reviewed plan of care with patient and facility Nurse supervisor   Labs/tests ordered: None   Sandrea Hughs, NP

## 2016-12-02 ENCOUNTER — Non-Acute Institutional Stay: Payer: Medicare Other | Admitting: Family

## 2016-12-02 ENCOUNTER — Encounter: Payer: Self-pay | Admitting: Family

## 2016-12-02 DIAGNOSIS — R531 Weakness: Secondary | ICD-10-CM | POA: Diagnosis not present

## 2016-12-02 DIAGNOSIS — R630 Anorexia: Secondary | ICD-10-CM | POA: Diagnosis not present

## 2016-12-02 DIAGNOSIS — J45909 Unspecified asthma, uncomplicated: Secondary | ICD-10-CM | POA: Diagnosis not present

## 2016-12-02 NOTE — Progress Notes (Signed)
Location:  [facility] Room Number: 11 Place of Service:  ALF [id number]) Provider: Krisy Dix FNP-C  Blanchie Serve, MD  Patient Care Team: Blanchie Serve, MD as PCP - General (Internal Medicine) Newton Pigg, MD as Consulting Physician (Obstetrics and Gynecology) Monna Fam, MD as Consulting Physician (Ophthalmology) Melida Quitter, MD as Consulting Physician (Otolaryngology) Myrlene Broker, MD as Consulting Physician (Urology) Melina Modena, [place], Nelda Bucks, NP as Nurse Practitioner (Family Medicine)  Extended Emergency Contact Information Primary Emergency Contact: Trentham,[name] Address: [place], [place] Phone: [phone] Work Phone: [phone] Mobile Phone: [phone] Relation: Son Secondary Emergency Contact: Teofilo Pod [place] of [place] Phone: [phone] Work Phone: [phone] Relation: Friend  Code Status:  DNR Goals of care: Advanced Directive information Advanced Directives 12/02/2016  Does Patient Have a Medical Advance Directive? Yes  Type of Advance Directive Out of facility DNR (pink MOST or yellow form);[place];Living will  Does patient want to make changes to medical advance directive? -  Copy of [place] in Chart? Yes  Pre-existing out of facility DNR order (yellow form or pink MOST form) Yellow form placed in chart (order not valid for inpatient use);Pink MOST form placed in chart (order not valid for inpatient use)     Chief Complaint  Patient presents with  . Acute Visit    decreased appetite; weakness and wheezing    HPI:  Pt is a 83 y.o. female seen today at [facility] for an acute visit for evaluation of wheezing, poor appetite and weakness. She is seen in her room today per facility Nurse reports. Nurse reports patient's POA has noticed patient increased weakness, wheezing and has a  poor appetite. Nurse states patient refused previous ordered Duonebs. Recent chest X-ray was negative for acute abnormalities.Patient states her depression has improved on lexapro no suicidal ideation.she continues to spend most time in her room.Currently on scheduled Advair and albuterol inhaler as needed. She does not request for albuterol for wheezing or shortness of breath.     Past Medical History:  Diagnosis Date  . Asthma   . Carpal tunnel syndrome 03/07/2009  . Disturbance of skin sensation 02/21/2009  . Diverticulosis of colon (without mention of hemorrhage) 02/16/2009  . Fracture of upper end of left humerus 11/18/14  . 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 Hypothyroid   . Osteoarthrosis, unspecified whether generalized or localized, unspecified site 02/16/2009  . Other abnormal blood chemistry 06/19/2010   hyperglycemia  . Other and unspecified hyperlipidemia 10/24/2009  . Other atopic dermatitis and related conditions 02/16/2009  . Otosclerosis, unspecified 02/22/2003  . Pain in limb 12/05/2009  . Senile osteoporosis 06/16/2010  . Sensorineural hearing loss, unspecified 02/21/2009  . Syncope and collapse 05/30/2009  . Unspecified nasal polyp 02/21/2009  . Unspecified urinary incontinence 02/21/2009   Past Surgical History:  Procedure Laterality Date  . ABDOMINAL HYSTERECTOMY  1999   Dr. Collier Bullock  . BREAST BIOPSY  07/16/1990   benign left breast needle biopsy  Dr. Margot Chimes  . CARPAL TUNNEL RELEASE  04/07/2009   right Dr. Daylene Katayama  . CATARACT EXTRACTION W/ INTRAOCULAR LENS  IMPLANT, BILATERAL Bilateral   . TRIGGER FINGER RELEASE Left 08/09/2014   Procedure: RELEASE TRIGGER FINGER/A-1 PULLEY LEFT MIDDLE FINGER;  Surgeon: Daryll Brod, MD;  Location: [facility];  Service: Orthopedics;  Laterality: Left;  REGIONAL/FAB  Allergies  Allergen Reactions  . Avelox [Moxifloxacin Hcl In Nacl] Itching  . Doxycycline   . Hista-Tabs [Triprolidine-Pse] Other (See Comments)    Passed out    . Septra [Sulfamethoxazole-Trimethoprim]   . Sulfa Antibiotics     Itching/rash (all over)    Outpatient Encounter Prescriptions as of 12/02/2016  Medication Sig  . acetaminophen (TYLENOL) 325 MG tablet Take 2 tablets (650 mg total) by mouth every 6 (six) hours as needed for mild pain or moderate pain.  Marland Kitchen albuterol (VENTOLIN HFA) 108 (90 Base) MCG/ACT inhaler Inhale 2 puffs by mouth every 6 hours as needed for shortness of breath  . escitalopram (LEXAPRO) 10 MG tablet Take 10 mg by mouth daily.  . feeding supplement (BOOST HIGH PROTEIN) LIQD Take 1 Container by mouth 2 (two) times daily between meals.   . fluticasone (FLONASE) 50 MCG/ACT nasal spray Place 2 sprays into both nostrils daily.  . Fluticasone-Salmeterol (ADVAIR DISKUS) 250-50 MCG/DOSE AEPB Inhale once daily to help breathing  . furosemide (LASIX) 40 MG tablet Take 40 mg by mouth every morning.  Marland Kitchen levothyroxine (SYNTHROID, LEVOTHROID) 25 MCG tablet Take 25 mcg by mouth daily before breakfast.  . mirtazapine (REMERON) 15 MG tablet Take 15 mg by mouth at bedtime.  . montelukast (SINGULAIR) 10 MG tablet Take one tablet by mouth once daily to help breathing  . MYRBETRIQ 25 MG TB24 tablet TAKE ONE TABLET BY MOUTH ONCE DAILY  . OXYGEN Inhale 2 L/min into the lungs as needed. Use as needed to keep O2 sats > 90%  . promethazine (PHENERGAN) 12.5 MG suppository Place 12.5 mg rectally every 4 (four) hours as needed for nausea or vomiting.  . raloxifene (EVISTA) 60 MG tablet Take 1 tablet by mouth  daily to treat osteoporosis  . ranitidine (ZANTAC) 150 MG capsule Take 150 mg by mouth daily.  Marland Kitchen ZINC OXIDE EX Apply 1 application topically 2 (two) times daily.   . [DISCONTINUED] escitalopram (LEXAPRO) 5 MG tablet Take 5 mg by mouth daily.   No facility-administered encounter medications on file as of 12/02/2016.     Review of Systems  Constitutional: Negative for activity change, chills, fatigue and fever.       Poor appetite per HPI    HENT: Positive for hearing loss. Negative for congestion, rhinorrhea, sinus pain, sinus pressure, sneezing, sore throat and trouble swallowing.   Eyes: Negative for discharge, redness and visual disturbance.  Respiratory: Positive for cough and wheezing. Negative for chest tightness and shortness of breath.   Cardiovascular: Positive for leg swelling. Negative for chest pain and palpitations.  Gastrointestinal: Negative for abdominal distention, abdominal pain, constipation, nausea and vomiting.  Skin: Negative for color change, pallor and rash.  Neurological: Negative for dizziness, syncope, light-headedness and headaches.  Psychiatric/Behavioral: Negative for agitation, confusion and suicidal ideas. The patient is not nervous/anxious.     Immunization History  Administered Date(s) Administered  . Influenza Whole 12/27/2011, 11/05/2012  . Influenza-Unspecified 11/18/2013, 11/03/2014, 11/16/2015, 11/13/2016  . PPD Test 04/04/2008, 11/23/2014  . Pneumococcal Conjugate-13 09/30/2016  . Pneumococcal Polysaccharide-23 02/05/2008, 06/03/2008  . Td 02/05/2008, 06/03/2008   Pertinent  Health Maintenance Due  Topic Date Due  . INFLUENZA VACCINE  Completed  . DEXA SCAN  Completed  . PNA vac Low Risk Adult  Completed   Fall Risk  09/23/2016 05/30/2015 05/16/2015 04/25/2015 01/02/2015  Falls in the past year? Yes Yes No No Yes  Number falls in past yr: 1 1 - - 1  Injury with Fall?  No Yes - - Yes  Comment - - - - -  Risk Factor Category  - - - - High Fall Risk  Risk for fall due to : - History of fall(s);Impaired balance/gait;Impaired mobility - - History of fall(s)  Follow up - Falls evaluation completed;Education provided - - Falls evaluation completed   Functional Status Survey:    Vitals:   12/02/16 1054  BP: 138/62  Pulse: 78  Resp: 20  Temp: 98.1 F (36.7 C)  SpO2: 92%  Weight: 127 lb (57.6 kg)  Height: 4\' 11"  (1.499 m)   Body mass index is 25.65 kg/m. Physical Exam   Constitutional: She is oriented to person, place, and time. She appears well-developed.  Elderly in no acute distress   HENT:  Head: Normocephalic.  Right Ear: External ear normal.  Left Ear: External ear normal.  Mouth/Throat: Oropharynx is clear and moist. No oropharyngeal exudate.  Eyes: Pupils are equal, round, and reactive to light. Conjunctivae and EOM are normal. Right eye exhibits no discharge. Left eye exhibits no discharge. No scleral icterus.  Neck: Normal range of motion. No JVD present. No thyromegaly present.  Cardiovascular: Normal rate, regular rhythm, normal heart sounds and intact distal pulses.  Exam reveals no gallop and no friction rub.   No murmur heard. Pulmonary/Chest: Effort normal. She has no rales. She exhibits no tenderness.  Bilateral diffuse expiration wheezes noted   Abdominal: Soft. Bowel sounds are normal. She exhibits no distension. There is no tenderness. There is no rebound and no guarding.  Musculoskeletal: She exhibits no tenderness.  Unsteady gait uses Rolator.Bilateral lower extremities 1+ edema knee high ted hose in place   Lymphadenopathy:    She has no cervical adenopathy.  Neurological: She is oriented to person, place, and time. Coordination normal.  Skin: Skin is warm and dry. No rash noted. No erythema. No pallor.  Psychiatric: She has a normal mood and affect.    Labs reviewed:  Recent Labs  01/15/16 05/13/16 07/15/16  NA 142 140 140  K 3.9 3.9 3.9  BUN 21 16 14   CREATININE 1.0 0.9 0.9    Recent Labs  01/15/16 05/13/16 07/15/16  AST 17 19 16   ALT 14 16 11   ALKPHOS 49 46 50    Recent Labs  01/15/16 05/13/16 07/15/16  WBC 7.1 6.5 7.7  HGB 13.0 12.4 11.9*  HCT 40 38 36  PLT 283 312 278   Lab Results  Component Value Date   TSH 1.32 07/15/2016   Lab Results  Component Value Date   HGBA1C 6.1 05/13/2016   Lab Results  Component Value Date   CHOL 211 (A) 02/14/2014   HDL 47 02/14/2014   LDLCALC 133 02/14/2014    TRIG 155 02/14/2014    Significant Diagnostic Results in last 30 days:  No results found.  Assessment/Plan 1.Asthma Bilateral lung expiration wheezes with nonproductive cough noted. Recent CXR negative for PNA. Continue on Advair and Singulair. Facility Nurse to assess for shortness of breath and wheezing every 8 hours and off  Albuterol 108 mcg/ACT inhaler. Patient does not call albuterol inhaler for wheezes or SOB. Continue to monitor.       2. Generalized weakness Suspect possible from poor oral intake. Will obtain CBC/diff and CMP to rule out infectious and metabolic etiologies. Encourage oral intake and hydration.Has refused therapy.Continue to monitor.   3. Poor appetite Continue on Remeron 15 mg Tablet daily at bedtime.CBC/diff and CMP as above. Continue to monitor.    Family/  staff Communication: Reviewed plan of care with patient and facility.   Labs/tests ordered: CBC/diff and CMP 12/05/2016.   Sandrea Hughs, NP

## 2016-12-05 DIAGNOSIS — R785 Finding of other psychotropic drug in blood: Secondary | ICD-10-CM | POA: Diagnosis not present

## 2016-12-05 DIAGNOSIS — M6281 Muscle weakness (generalized): Secondary | ICD-10-CM | POA: Diagnosis not present

## 2016-12-05 DIAGNOSIS — J329 Chronic sinusitis, unspecified: Secondary | ICD-10-CM | POA: Diagnosis not present

## 2016-12-05 DIAGNOSIS — J45909 Unspecified asthma, uncomplicated: Secondary | ICD-10-CM | POA: Diagnosis not present

## 2016-12-05 LAB — HEPATIC FUNCTION PANEL
ALT: 9 (ref 7–35)
AST: 17 (ref 13–35)
Alkaline Phosphatase: 58 (ref 25–125)
Bilirubin, Total: 0.5

## 2016-12-05 LAB — BASIC METABOLIC PANEL
BUN: 23 — AB (ref 4–21)
CREATININE: 1 (ref 0.5–1.1)
GLUCOSE: 90
POTASSIUM: 4.1 (ref 3.4–5.3)
SODIUM: 145 (ref 137–147)

## 2016-12-05 LAB — CBC AND DIFFERENTIAL
HEMATOCRIT: 39 (ref 36–46)
Hemoglobin: 12.7 (ref 12.0–16.0)
Neutrophils Absolute: 4132
Platelets: 272 (ref 150–399)
WBC: 7.3

## 2016-12-06 ENCOUNTER — Encounter: Payer: Self-pay | Admitting: *Deleted

## 2016-12-17 ENCOUNTER — Encounter: Payer: Self-pay | Admitting: Family

## 2016-12-17 ENCOUNTER — Non-Acute Institutional Stay: Payer: Medicare Other | Admitting: Family

## 2016-12-17 DIAGNOSIS — R2681 Unsteadiness on feet: Secondary | ICD-10-CM | POA: Diagnosis not present

## 2016-12-17 DIAGNOSIS — Z043 Encounter for examination and observation following other accident: Secondary | ICD-10-CM

## 2016-12-17 DIAGNOSIS — S51812A Laceration without foreign body of left forearm, initial encounter: Secondary | ICD-10-CM | POA: Diagnosis not present

## 2016-12-17 NOTE — Progress Notes (Signed)
Location:  [place] Room Number: 11 Place of Service:  ALF [phone]) Provider: Rochel Privett FNP-C  Blanchie Serve, MD  Patient Care Team: Blanchie Serve, MD as PCP - General (Internal Medicine) Newton Pigg, MD as Consulting Physician (Obstetrics and Gynecology) Monna Fam, MD as Consulting Physician (Ophthalmology) Melida Quitter, MD as Consulting Physician (Otolaryngology) Myrlene Broker, MD as Consulting Physician (Urology) Melina Modena, [place], Nelda Bucks, NP as Nurse Practitioner (Family Medicine)  Extended Emergency Contact Information Primary Emergency Contact: Purdon,[name] Address: [place], [place] Phone: [phone] Work Phone: [phone] Mobile Phone: [phone] Relation: Son Secondary Emergency Contact: Teofilo Pod [place] of [place] Phone: [phone] Work Phone: [phone] Relation: Friend  Code Status:  DNR Goals of care: Advanced Directive information Advanced Directives 12/17/2016  Does Patient Have a Medical Advance Directive? Yes  Type of Advance Directive Out of facility DNR (pink MOST or yellow form);[place];Living will  Does patient want to make changes to medical advance directive? -  Copy of [place] in Chart? Yes  Pre-existing out of facility DNR order (yellow form or pink MOST form) Yellow form placed in chart (order not valid for inpatient use);Pink MOST form placed in chart (order not valid for inpatient use)     Chief Complaint  Patient presents with  . Acute Visit    fall with skin tear to left arm    HPI:  Pt is a 83 y.o. female seen today at [facility] for an acute visit for evaluation of fall.She is seen in her room today with facility Nurse at bedside. Nurse reports patient fell in the bathroom 12/16/2016 sustaining a left forearm large skin tear.she states slid to the floor  and landed on her bottom.She was able to scoot on the floor until she reached on the call bell to call for assistance.She denies hitting her head or hip.She further denies any dizziness, headache, fever, chills or signs of urinary tract infections.She states previous wheezing has improved with use of inhaler.she has been able to walk without any pain.    Past Medical History:  Diagnosis Date  . Asthma   . Carpal tunnel syndrome 03/07/2009  . Disturbance of skin sensation 02/21/2009  . Diverticulosis of colon (without mention of hemorrhage) 02/16/2009  . Fracture of upper end of left humerus 11/18/14  . 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 Hypothyroid   . Osteoarthrosis, unspecified whether generalized or localized, unspecified site 02/16/2009  . Other abnormal blood chemistry 06/19/2010   hyperglycemia  . Other and unspecified hyperlipidemia 10/24/2009  . Other atopic dermatitis and related conditions 02/16/2009  . Otosclerosis, unspecified 02/22/2003  . Pain in limb 12/05/2009  . Senile osteoporosis 06/16/2010  . Sensorineural hearing loss, unspecified 02/21/2009  . Syncope and collapse 05/30/2009  . Unspecified nasal polyp 02/21/2009  . Unspecified urinary incontinence 02/21/2009   Past Surgical History:  Procedure Laterality Date  . ABDOMINAL HYSTERECTOMY  1999   Dr. Collier Bullock  . BREAST BIOPSY  07/16/1990   benign left breast needle biopsy  Dr. Margot Chimes  . CARPAL TUNNEL RELEASE  04/07/2009   right Dr. Daylene Katayama  . CATARACT EXTRACTION W/ INTRAOCULAR LENS  IMPLANT, BILATERAL Bilateral     Allergies  Allergen Reactions  . Avelox [Moxifloxacin Hcl In Nacl] Itching  . Doxycycline   . Hista-Tabs [Triprolidine-Pse] Other (See Comments)  Passed out  . Septra [Sulfamethoxazole-Trimethoprim]   . Sulfa Antibiotics     Itching/rash (all over)    Outpatient Encounter Medications as of 12/17/2016  Medication Sig  . acetaminophen (TYLENOL) 325 MG tablet Take 2 tablets (650 mg total) by mouth every 6  (six) hours as needed for mild pain or moderate pain.  Marland Kitchen albuterol (VENTOLIN HFA) 108 (90 Base) MCG/ACT inhaler Inhale 2 puffs by mouth every 6 hours as needed for shortness of breath  . escitalopram (LEXAPRO) 10 MG tablet Take 10 mg by mouth daily.  . feeding supplement (BOOST HIGH PROTEIN) LIQD Take 1 Container by mouth 2 (two) times daily between meals.   . fluticasone (FLONASE) 50 MCG/ACT nasal spray Place 2 sprays into both nostrils daily.  . Fluticasone-Salmeterol (ADVAIR) 250-50 MCG/DOSE AEPB Inhale 1 puff 2 (two) times daily into the lungs. Rinse mouth after use  . furosemide (LASIX) 40 MG tablet Take 40 mg by mouth every morning.  Marland Kitchen levothyroxine (SYNTHROID, LEVOTHROID) 25 MCG tablet Take 25 mcg by mouth daily before breakfast.  . mirtazapine (REMERON) 15 MG tablet Take 15 mg by mouth at bedtime.  . montelukast (SINGULAIR) 10 MG tablet Take one tablet by mouth once daily to help breathing  . MYRBETRIQ 25 MG TB24 tablet TAKE ONE TABLET BY MOUTH ONCE DAILY  . OXYGEN Inhale 2 L/min into the lungs as needed. Use as needed to keep O2 sats > 90%  . promethazine (PHENERGAN) 12.5 MG suppository Place 12.5 mg rectally every 4 (four) hours as needed for nausea or vomiting.  . raloxifene (EVISTA) 60 MG tablet Take 1 tablet by mouth  daily to treat osteoporosis  . ranitidine (ZANTAC) 150 MG capsule Take 150 mg by mouth daily.  Marland Kitchen ZINC OXIDE EX Apply 1 application topically 2 (two) times daily.   . [DISCONTINUED] Fluticasone-Salmeterol (ADVAIR DISKUS) 250-50 MCG/DOSE AEPB Inhale once daily to help breathing   No facility-administered encounter medications on file as of 12/17/2016.     Review of Systems  Constitutional: Negative for activity change, appetite change, chills, fatigue and fever.  HENT: Negative for congestion, rhinorrhea, sinus pressure, sinus pain, sneezing and sore throat.   Eyes: Negative for pain, redness and itching.  Respiratory: Negative for chest tightness, shortness of  breath and wheezing.   Cardiovascular: Negative for chest pain, palpitations and leg swelling.  Gastrointestinal: Negative for abdominal distention, abdominal pain, constipation, diarrhea, nausea and vomiting.  Endocrine: Negative for cold intolerance, heat intolerance, polydipsia, polyphagia and polyuria.  Genitourinary: Negative for dysuria, flank pain, frequency and urgency.  Musculoskeletal: Positive for gait problem.  Skin: Negative for color change, pallor and rash.  Neurological: Negative for dizziness, seizures, syncope, light-headedness and headaches.  Psychiatric/Behavioral: Negative for agitation, confusion and sleep disturbance. The patient is not nervous/anxious.     Immunization History  Administered Date(s) Administered  . Influenza Whole 12/27/2011, 11/05/2012  . Influenza-Unspecified 11/18/2013, 11/03/2014, 11/16/2015, 11/13/2016  . PPD Test 04/04/2008, 11/23/2014  . Pneumococcal Conjugate-13 09/30/2016  . Pneumococcal Polysaccharide-23 02/05/2008, 06/03/2008  . Td 02/05/2008, 06/03/2008   Pertinent  Health Maintenance Due  Topic Date Due  . INFLUENZA VACCINE  Completed  . DEXA SCAN  Completed  . PNA vac Low Risk Adult  Completed   Fall Risk  09/23/2016 05/30/2015 05/16/2015 04/25/2015 01/02/2015  Falls in the past year? Yes Yes No No Yes  Number falls in past yr: 1 1 - - 1  Injury with Fall? No Yes - - Yes  Comment - - - - -  Risk Factor Category  - - - - High Fall Risk  Risk for fall due to : - History of fall(s);Impaired balance/gait;Impaired mobility - - History of fall(s)  Follow up - Falls evaluation completed;Education provided - - Falls evaluation completed    Vitals:   12/17/16 1450  BP: 127/69  Pulse: 77  Resp: 20  Temp: 98.3 F (36.8 C)  SpO2: 97%  Weight: 126 lb 8 oz (57.4 kg)  Height: 4\' 11"  (1.499 m)   Body mass index is 25.55 kg/m. Physical Exam  Constitutional: She is oriented to person, place, and time. She appears well-developed and  well-nourished.  Elderly in no acute distress  HENT:  Head: Normocephalic.  Right Ear: External ear normal.  Left Ear: External ear normal.  Mouth/Throat: Oropharynx is clear and moist. No oropharyngeal exudate.  Eyes: Conjunctivae and EOM are normal. Pupils are equal, round, and reactive to light. Right eye exhibits no discharge. Left eye exhibits no discharge. No scleral icterus.  Neck: Normal range of motion. No JVD present. No thyromegaly present.  Cardiovascular: Normal rate, regular rhythm, normal heart sounds and intact distal pulses. Exam reveals no gallop and no friction rub.  No murmur heard. Pulmonary/Chest: Effort normal and breath sounds normal. No respiratory distress. She has no wheezes. She has no rales.  Abdominal: Soft. Bowel sounds are normal. She exhibits no distension. There is no tenderness. There is no rebound and no guarding.  Musculoskeletal: She exhibits no edema or tenderness.  Unsteady gait uses Rolator.bilateral lower extremities knee high ted hose in place.   Lymphadenopathy:    She has no cervical adenopathy.  Neurological: She is oriented to person, place, and time. Coordination normal.  Skin: Skin is warm and dry. No rash noted. No erythema.  Left forearm skin tear edges well approximated with steri-strips intact. No signs of infections noted.   Psychiatric: She has a normal mood and affect.   Labs reviewed: Recent Labs    05/13/16 07/15/16 12/05/16  NA 140 140 145  K 3.9 3.9 4.1  BUN 16 14 23*  CREATININE 0.9 0.9 1.0   Recent Labs    05/13/16 07/15/16 12/05/16  AST 19 16 17   ALT 16 11 9   ALKPHOS 46 50 58   Recent Labs    05/13/16 07/15/16 12/05/16  WBC 6.5 7.7 7.3  NEUTROABS  --   --  4,132  HGB 12.4 11.9* 12.7  HCT 38 36 39  PLT 312 278 272   Lab Results  Component Value Date   TSH 1.32 07/15/2016   Lab Results  Component Value Date   HGBA1C 6.1 05/13/2016   Lab Results  Component Value Date   CHOL 211 (A) 02/14/2014   HDL 47  02/14/2014   LDLCALC 133 02/14/2014   TRIG 155 02/14/2014    Significant Diagnostic Results in last 30 days:  No results found.  Assessment/Plan 1. Unsteady gait With her advance age and frailty she remains high risk for fall.B/p reviewed within range.will have her work with PT/OT for ROM, exercise, gait stability and muscle strengthening.continue fall and safety precautions.    2. Encounter for post fall examination Slid to floor in the bathroom sustaining left forearm skin tear.Encouraged to call for assistance. PT/OT as above.   3. Skin tear of left forearm without complication, initial encounter Left forearm skin tear edges well approximated with steri-strips intact. No signs of infections noted.continue with current dressing change. Monitor for signs or symptoms of infections.   Family/ staff  Communication: Reviewed plan of care with patient and facility Nurse.  Labs/tests ordered: None   Gualberto Wahlen C Anayi Bricco, NP

## 2016-12-19 DIAGNOSIS — R55 Syncope and collapse: Secondary | ICD-10-CM | POA: Diagnosis not present

## 2016-12-19 DIAGNOSIS — M6281 Muscle weakness (generalized): Secondary | ICD-10-CM | POA: Diagnosis not present

## 2016-12-19 DIAGNOSIS — M818 Other osteoporosis without current pathological fracture: Secondary | ICD-10-CM | POA: Diagnosis not present

## 2016-12-19 DIAGNOSIS — N3281 Overactive bladder: Secondary | ICD-10-CM | POA: Diagnosis not present

## 2016-12-19 DIAGNOSIS — R2681 Unsteadiness on feet: Secondary | ICD-10-CM | POA: Diagnosis not present

## 2016-12-19 DIAGNOSIS — Z9181 History of falling: Secondary | ICD-10-CM | POA: Diagnosis not present

## 2016-12-19 DIAGNOSIS — R29898 Other symptoms and signs involving the musculoskeletal system: Secondary | ICD-10-CM | POA: Diagnosis not present

## 2016-12-20 DIAGNOSIS — M818 Other osteoporosis without current pathological fracture: Secondary | ICD-10-CM | POA: Diagnosis not present

## 2016-12-20 DIAGNOSIS — R29898 Other symptoms and signs involving the musculoskeletal system: Secondary | ICD-10-CM | POA: Diagnosis not present

## 2016-12-20 DIAGNOSIS — Z9181 History of falling: Secondary | ICD-10-CM | POA: Diagnosis not present

## 2016-12-20 DIAGNOSIS — R2681 Unsteadiness on feet: Secondary | ICD-10-CM | POA: Diagnosis not present

## 2016-12-20 DIAGNOSIS — M6281 Muscle weakness (generalized): Secondary | ICD-10-CM | POA: Diagnosis not present

## 2016-12-20 DIAGNOSIS — N3281 Overactive bladder: Secondary | ICD-10-CM | POA: Diagnosis not present

## 2016-12-25 DIAGNOSIS — N3281 Overactive bladder: Secondary | ICD-10-CM | POA: Diagnosis not present

## 2016-12-25 DIAGNOSIS — R2681 Unsteadiness on feet: Secondary | ICD-10-CM | POA: Diagnosis not present

## 2016-12-25 DIAGNOSIS — M818 Other osteoporosis without current pathological fracture: Secondary | ICD-10-CM | POA: Diagnosis not present

## 2016-12-25 DIAGNOSIS — M6281 Muscle weakness (generalized): Secondary | ICD-10-CM | POA: Diagnosis not present

## 2016-12-25 DIAGNOSIS — Z9181 History of falling: Secondary | ICD-10-CM | POA: Diagnosis not present

## 2016-12-25 DIAGNOSIS — R29898 Other symptoms and signs involving the musculoskeletal system: Secondary | ICD-10-CM | POA: Diagnosis not present

## 2016-12-26 DIAGNOSIS — M6281 Muscle weakness (generalized): Secondary | ICD-10-CM | POA: Diagnosis not present

## 2016-12-26 DIAGNOSIS — R2681 Unsteadiness on feet: Secondary | ICD-10-CM | POA: Diagnosis not present

## 2016-12-26 DIAGNOSIS — N3281 Overactive bladder: Secondary | ICD-10-CM | POA: Diagnosis not present

## 2016-12-26 DIAGNOSIS — R29898 Other symptoms and signs involving the musculoskeletal system: Secondary | ICD-10-CM | POA: Diagnosis not present

## 2016-12-26 DIAGNOSIS — M818 Other osteoporosis without current pathological fracture: Secondary | ICD-10-CM | POA: Diagnosis not present

## 2016-12-26 DIAGNOSIS — Z9181 History of falling: Secondary | ICD-10-CM | POA: Diagnosis not present

## 2016-12-30 DIAGNOSIS — R2681 Unsteadiness on feet: Secondary | ICD-10-CM | POA: Diagnosis not present

## 2016-12-30 DIAGNOSIS — M818 Other osteoporosis without current pathological fracture: Secondary | ICD-10-CM | POA: Diagnosis not present

## 2016-12-30 DIAGNOSIS — N3281 Overactive bladder: Secondary | ICD-10-CM | POA: Diagnosis not present

## 2016-12-30 DIAGNOSIS — M6281 Muscle weakness (generalized): Secondary | ICD-10-CM | POA: Diagnosis not present

## 2016-12-30 DIAGNOSIS — R29898 Other symptoms and signs involving the musculoskeletal system: Secondary | ICD-10-CM | POA: Diagnosis not present

## 2016-12-30 DIAGNOSIS — Z9181 History of falling: Secondary | ICD-10-CM | POA: Diagnosis not present

## 2017-01-01 DIAGNOSIS — N3281 Overactive bladder: Secondary | ICD-10-CM | POA: Diagnosis not present

## 2017-01-01 DIAGNOSIS — M6281 Muscle weakness (generalized): Secondary | ICD-10-CM | POA: Diagnosis not present

## 2017-01-01 DIAGNOSIS — Z9181 History of falling: Secondary | ICD-10-CM | POA: Diagnosis not present

## 2017-01-01 DIAGNOSIS — M818 Other osteoporosis without current pathological fracture: Secondary | ICD-10-CM | POA: Diagnosis not present

## 2017-01-01 DIAGNOSIS — R2681 Unsteadiness on feet: Secondary | ICD-10-CM | POA: Diagnosis not present

## 2017-01-01 DIAGNOSIS — R29898 Other symptoms and signs involving the musculoskeletal system: Secondary | ICD-10-CM | POA: Diagnosis not present

## 2017-01-02 DIAGNOSIS — M6281 Muscle weakness (generalized): Secondary | ICD-10-CM | POA: Diagnosis not present

## 2017-01-02 DIAGNOSIS — R2681 Unsteadiness on feet: Secondary | ICD-10-CM | POA: Diagnosis not present

## 2017-01-02 DIAGNOSIS — M818 Other osteoporosis without current pathological fracture: Secondary | ICD-10-CM | POA: Diagnosis not present

## 2017-01-02 DIAGNOSIS — N3281 Overactive bladder: Secondary | ICD-10-CM | POA: Diagnosis not present

## 2017-01-02 DIAGNOSIS — R29898 Other symptoms and signs involving the musculoskeletal system: Secondary | ICD-10-CM | POA: Diagnosis not present

## 2017-01-02 DIAGNOSIS — Z9181 History of falling: Secondary | ICD-10-CM | POA: Diagnosis not present

## 2017-01-03 DIAGNOSIS — M6281 Muscle weakness (generalized): Secondary | ICD-10-CM | POA: Diagnosis not present

## 2017-01-03 DIAGNOSIS — R29898 Other symptoms and signs involving the musculoskeletal system: Secondary | ICD-10-CM | POA: Diagnosis not present

## 2017-01-03 DIAGNOSIS — Z9181 History of falling: Secondary | ICD-10-CM | POA: Diagnosis not present

## 2017-01-03 DIAGNOSIS — M818 Other osteoporosis without current pathological fracture: Secondary | ICD-10-CM | POA: Diagnosis not present

## 2017-01-03 DIAGNOSIS — R2681 Unsteadiness on feet: Secondary | ICD-10-CM | POA: Diagnosis not present

## 2017-01-03 DIAGNOSIS — N3281 Overactive bladder: Secondary | ICD-10-CM | POA: Diagnosis not present

## 2017-01-06 DIAGNOSIS — N3281 Overactive bladder: Secondary | ICD-10-CM | POA: Diagnosis not present

## 2017-01-06 DIAGNOSIS — R29898 Other symptoms and signs involving the musculoskeletal system: Secondary | ICD-10-CM | POA: Diagnosis not present

## 2017-01-06 DIAGNOSIS — Z9181 History of falling: Secondary | ICD-10-CM | POA: Diagnosis not present

## 2017-01-06 DIAGNOSIS — M6281 Muscle weakness (generalized): Secondary | ICD-10-CM | POA: Diagnosis not present

## 2017-01-06 DIAGNOSIS — R55 Syncope and collapse: Secondary | ICD-10-CM | POA: Diagnosis not present

## 2017-01-06 DIAGNOSIS — M818 Other osteoporosis without current pathological fracture: Secondary | ICD-10-CM | POA: Diagnosis not present

## 2017-01-06 DIAGNOSIS — R2681 Unsteadiness on feet: Secondary | ICD-10-CM | POA: Diagnosis not present

## 2017-01-07 DIAGNOSIS — N3281 Overactive bladder: Secondary | ICD-10-CM | POA: Diagnosis not present

## 2017-01-07 DIAGNOSIS — Z9181 History of falling: Secondary | ICD-10-CM | POA: Diagnosis not present

## 2017-01-07 DIAGNOSIS — R2681 Unsteadiness on feet: Secondary | ICD-10-CM | POA: Diagnosis not present

## 2017-01-07 DIAGNOSIS — M818 Other osteoporosis without current pathological fracture: Secondary | ICD-10-CM | POA: Diagnosis not present

## 2017-01-07 DIAGNOSIS — R29898 Other symptoms and signs involving the musculoskeletal system: Secondary | ICD-10-CM | POA: Diagnosis not present

## 2017-01-07 DIAGNOSIS — M6281 Muscle weakness (generalized): Secondary | ICD-10-CM | POA: Diagnosis not present

## 2017-01-08 DIAGNOSIS — M6281 Muscle weakness (generalized): Secondary | ICD-10-CM | POA: Diagnosis not present

## 2017-01-08 DIAGNOSIS — N3281 Overactive bladder: Secondary | ICD-10-CM | POA: Diagnosis not present

## 2017-01-08 DIAGNOSIS — R29898 Other symptoms and signs involving the musculoskeletal system: Secondary | ICD-10-CM | POA: Diagnosis not present

## 2017-01-08 DIAGNOSIS — Z9181 History of falling: Secondary | ICD-10-CM | POA: Diagnosis not present

## 2017-01-08 DIAGNOSIS — M818 Other osteoporosis without current pathological fracture: Secondary | ICD-10-CM | POA: Diagnosis not present

## 2017-01-08 DIAGNOSIS — R2681 Unsteadiness on feet: Secondary | ICD-10-CM | POA: Diagnosis not present

## 2017-01-09 DIAGNOSIS — Z9181 History of falling: Secondary | ICD-10-CM | POA: Diagnosis not present

## 2017-01-09 DIAGNOSIS — R29898 Other symptoms and signs involving the musculoskeletal system: Secondary | ICD-10-CM | POA: Diagnosis not present

## 2017-01-09 DIAGNOSIS — M6281 Muscle weakness (generalized): Secondary | ICD-10-CM | POA: Diagnosis not present

## 2017-01-09 DIAGNOSIS — R2681 Unsteadiness on feet: Secondary | ICD-10-CM | POA: Diagnosis not present

## 2017-01-09 DIAGNOSIS — N3281 Overactive bladder: Secondary | ICD-10-CM | POA: Diagnosis not present

## 2017-01-09 DIAGNOSIS — M818 Other osteoporosis without current pathological fracture: Secondary | ICD-10-CM | POA: Diagnosis not present

## 2017-01-13 DIAGNOSIS — R2681 Unsteadiness on feet: Secondary | ICD-10-CM | POA: Diagnosis not present

## 2017-01-13 DIAGNOSIS — M6281 Muscle weakness (generalized): Secondary | ICD-10-CM | POA: Diagnosis not present

## 2017-01-13 DIAGNOSIS — M818 Other osteoporosis without current pathological fracture: Secondary | ICD-10-CM | POA: Diagnosis not present

## 2017-01-13 DIAGNOSIS — R29898 Other symptoms and signs involving the musculoskeletal system: Secondary | ICD-10-CM | POA: Diagnosis not present

## 2017-01-13 DIAGNOSIS — Z9181 History of falling: Secondary | ICD-10-CM | POA: Diagnosis not present

## 2017-01-13 DIAGNOSIS — N3281 Overactive bladder: Secondary | ICD-10-CM | POA: Diagnosis not present

## 2017-01-14 DIAGNOSIS — R05 Cough: Secondary | ICD-10-CM | POA: Diagnosis not present

## 2017-01-14 DIAGNOSIS — R55 Syncope and collapse: Secondary | ICD-10-CM | POA: Diagnosis not present

## 2017-01-14 DIAGNOSIS — J45909 Unspecified asthma, uncomplicated: Secondary | ICD-10-CM | POA: Diagnosis not present

## 2017-01-14 DIAGNOSIS — M6281 Muscle weakness (generalized): Secondary | ICD-10-CM | POA: Diagnosis not present

## 2017-01-14 DIAGNOSIS — Z9181 History of falling: Secondary | ICD-10-CM | POA: Diagnosis not present

## 2017-01-14 DIAGNOSIS — R29898 Other symptoms and signs involving the musculoskeletal system: Secondary | ICD-10-CM | POA: Diagnosis not present

## 2017-01-14 DIAGNOSIS — M818 Other osteoporosis without current pathological fracture: Secondary | ICD-10-CM | POA: Diagnosis not present

## 2017-01-14 DIAGNOSIS — N3281 Overactive bladder: Secondary | ICD-10-CM | POA: Diagnosis not present

## 2017-01-14 DIAGNOSIS — R2681 Unsteadiness on feet: Secondary | ICD-10-CM | POA: Diagnosis not present

## 2017-01-14 LAB — HEPATIC FUNCTION PANEL
ALK PHOS: 60 (ref 25–125)
ALT: 14 (ref 7–35)
AST: 16 (ref 13–35)
Bilirubin, Total: 0.3

## 2017-01-14 LAB — CBC AND DIFFERENTIAL
HCT: 38 (ref 36–46)
HEMOGLOBIN: 12.7 (ref 12.0–16.0)
Platelets: 291 (ref 150–399)
WBC: 9.3

## 2017-01-14 LAB — BASIC METABOLIC PANEL
BUN: 23 — AB (ref 4–21)
Creatinine: 1.2 — AB (ref 0.5–1.1)
Glucose: 92
POTASSIUM: 4.5 (ref 3.4–5.3)
SODIUM: 138 (ref 137–147)

## 2017-01-15 DIAGNOSIS — N3281 Overactive bladder: Secondary | ICD-10-CM | POA: Diagnosis not present

## 2017-01-15 DIAGNOSIS — R29898 Other symptoms and signs involving the musculoskeletal system: Secondary | ICD-10-CM | POA: Diagnosis not present

## 2017-01-15 DIAGNOSIS — R2681 Unsteadiness on feet: Secondary | ICD-10-CM | POA: Diagnosis not present

## 2017-01-15 DIAGNOSIS — M6281 Muscle weakness (generalized): Secondary | ICD-10-CM | POA: Diagnosis not present

## 2017-01-15 DIAGNOSIS — Z9181 History of falling: Secondary | ICD-10-CM | POA: Diagnosis not present

## 2017-01-15 DIAGNOSIS — M818 Other osteoporosis without current pathological fracture: Secondary | ICD-10-CM | POA: Diagnosis not present

## 2017-01-16 DIAGNOSIS — R2681 Unsteadiness on feet: Secondary | ICD-10-CM | POA: Diagnosis not present

## 2017-01-16 DIAGNOSIS — R29898 Other symptoms and signs involving the musculoskeletal system: Secondary | ICD-10-CM | POA: Diagnosis not present

## 2017-01-16 DIAGNOSIS — N3281 Overactive bladder: Secondary | ICD-10-CM | POA: Diagnosis not present

## 2017-01-16 DIAGNOSIS — Z9181 History of falling: Secondary | ICD-10-CM | POA: Diagnosis not present

## 2017-01-16 DIAGNOSIS — M818 Other osteoporosis without current pathological fracture: Secondary | ICD-10-CM | POA: Diagnosis not present

## 2017-01-16 DIAGNOSIS — M6281 Muscle weakness (generalized): Secondary | ICD-10-CM | POA: Diagnosis not present

## 2017-01-17 DIAGNOSIS — M6281 Muscle weakness (generalized): Secondary | ICD-10-CM | POA: Diagnosis not present

## 2017-01-17 DIAGNOSIS — Z9181 History of falling: Secondary | ICD-10-CM | POA: Diagnosis not present

## 2017-01-17 DIAGNOSIS — R2681 Unsteadiness on feet: Secondary | ICD-10-CM | POA: Diagnosis not present

## 2017-01-17 DIAGNOSIS — M818 Other osteoporosis without current pathological fracture: Secondary | ICD-10-CM | POA: Diagnosis not present

## 2017-01-17 DIAGNOSIS — N3281 Overactive bladder: Secondary | ICD-10-CM | POA: Diagnosis not present

## 2017-01-17 DIAGNOSIS — R29898 Other symptoms and signs involving the musculoskeletal system: Secondary | ICD-10-CM | POA: Diagnosis not present

## 2017-01-20 DIAGNOSIS — R2681 Unsteadiness on feet: Secondary | ICD-10-CM | POA: Diagnosis not present

## 2017-01-20 DIAGNOSIS — N3281 Overactive bladder: Secondary | ICD-10-CM | POA: Diagnosis not present

## 2017-01-20 DIAGNOSIS — Z9181 History of falling: Secondary | ICD-10-CM | POA: Diagnosis not present

## 2017-01-20 DIAGNOSIS — M6281 Muscle weakness (generalized): Secondary | ICD-10-CM | POA: Diagnosis not present

## 2017-01-20 DIAGNOSIS — R29898 Other symptoms and signs involving the musculoskeletal system: Secondary | ICD-10-CM | POA: Diagnosis not present

## 2017-01-20 DIAGNOSIS — M818 Other osteoporosis without current pathological fracture: Secondary | ICD-10-CM | POA: Diagnosis not present

## 2017-01-21 DIAGNOSIS — M6281 Muscle weakness (generalized): Secondary | ICD-10-CM | POA: Diagnosis not present

## 2017-01-21 DIAGNOSIS — R29898 Other symptoms and signs involving the musculoskeletal system: Secondary | ICD-10-CM | POA: Diagnosis not present

## 2017-01-21 DIAGNOSIS — M818 Other osteoporosis without current pathological fracture: Secondary | ICD-10-CM | POA: Diagnosis not present

## 2017-01-21 DIAGNOSIS — Z9181 History of falling: Secondary | ICD-10-CM | POA: Diagnosis not present

## 2017-01-21 DIAGNOSIS — N3281 Overactive bladder: Secondary | ICD-10-CM | POA: Diagnosis not present

## 2017-01-21 DIAGNOSIS — R2681 Unsteadiness on feet: Secondary | ICD-10-CM | POA: Diagnosis not present

## 2017-01-22 DIAGNOSIS — R2681 Unsteadiness on feet: Secondary | ICD-10-CM | POA: Diagnosis not present

## 2017-01-22 DIAGNOSIS — N3281 Overactive bladder: Secondary | ICD-10-CM | POA: Diagnosis not present

## 2017-01-22 DIAGNOSIS — R29898 Other symptoms and signs involving the musculoskeletal system: Secondary | ICD-10-CM | POA: Diagnosis not present

## 2017-01-22 DIAGNOSIS — M818 Other osteoporosis without current pathological fracture: Secondary | ICD-10-CM | POA: Diagnosis not present

## 2017-01-22 DIAGNOSIS — M6281 Muscle weakness (generalized): Secondary | ICD-10-CM | POA: Diagnosis not present

## 2017-01-22 DIAGNOSIS — Z9181 History of falling: Secondary | ICD-10-CM | POA: Diagnosis not present

## 2017-01-23 DIAGNOSIS — N3281 Overactive bladder: Secondary | ICD-10-CM | POA: Diagnosis not present

## 2017-01-23 DIAGNOSIS — Z9181 History of falling: Secondary | ICD-10-CM | POA: Diagnosis not present

## 2017-01-23 DIAGNOSIS — M818 Other osteoporosis without current pathological fracture: Secondary | ICD-10-CM | POA: Diagnosis not present

## 2017-01-23 DIAGNOSIS — M6281 Muscle weakness (generalized): Secondary | ICD-10-CM | POA: Diagnosis not present

## 2017-01-23 DIAGNOSIS — R2681 Unsteadiness on feet: Secondary | ICD-10-CM | POA: Diagnosis not present

## 2017-01-23 DIAGNOSIS — R29898 Other symptoms and signs involving the musculoskeletal system: Secondary | ICD-10-CM | POA: Diagnosis not present

## 2017-01-24 DIAGNOSIS — M6281 Muscle weakness (generalized): Secondary | ICD-10-CM | POA: Diagnosis not present

## 2017-01-24 DIAGNOSIS — R29898 Other symptoms and signs involving the musculoskeletal system: Secondary | ICD-10-CM | POA: Diagnosis not present

## 2017-01-24 DIAGNOSIS — Z9181 History of falling: Secondary | ICD-10-CM | POA: Diagnosis not present

## 2017-01-24 DIAGNOSIS — N3281 Overactive bladder: Secondary | ICD-10-CM | POA: Diagnosis not present

## 2017-01-24 DIAGNOSIS — M818 Other osteoporosis without current pathological fracture: Secondary | ICD-10-CM | POA: Diagnosis not present

## 2017-01-24 DIAGNOSIS — R2681 Unsteadiness on feet: Secondary | ICD-10-CM | POA: Diagnosis not present

## 2017-01-26 DIAGNOSIS — N3281 Overactive bladder: Secondary | ICD-10-CM | POA: Diagnosis not present

## 2017-01-26 DIAGNOSIS — M818 Other osteoporosis without current pathological fracture: Secondary | ICD-10-CM | POA: Diagnosis not present

## 2017-01-26 DIAGNOSIS — R2681 Unsteadiness on feet: Secondary | ICD-10-CM | POA: Diagnosis not present

## 2017-01-26 DIAGNOSIS — Z9181 History of falling: Secondary | ICD-10-CM | POA: Diagnosis not present

## 2017-01-26 DIAGNOSIS — M6281 Muscle weakness (generalized): Secondary | ICD-10-CM | POA: Diagnosis not present

## 2017-01-26 DIAGNOSIS — R29898 Other symptoms and signs involving the musculoskeletal system: Secondary | ICD-10-CM | POA: Diagnosis not present

## 2017-01-29 DIAGNOSIS — M6281 Muscle weakness (generalized): Secondary | ICD-10-CM | POA: Diagnosis not present

## 2017-01-29 DIAGNOSIS — Z9181 History of falling: Secondary | ICD-10-CM | POA: Diagnosis not present

## 2017-01-29 DIAGNOSIS — R2681 Unsteadiness on feet: Secondary | ICD-10-CM | POA: Diagnosis not present

## 2017-01-29 DIAGNOSIS — R29898 Other symptoms and signs involving the musculoskeletal system: Secondary | ICD-10-CM | POA: Diagnosis not present

## 2017-01-29 DIAGNOSIS — N3281 Overactive bladder: Secondary | ICD-10-CM | POA: Diagnosis not present

## 2017-01-29 DIAGNOSIS — M818 Other osteoporosis without current pathological fracture: Secondary | ICD-10-CM | POA: Diagnosis not present

## 2017-02-03 DIAGNOSIS — M818 Other osteoporosis without current pathological fracture: Secondary | ICD-10-CM | POA: Diagnosis not present

## 2017-02-03 DIAGNOSIS — Z9181 History of falling: Secondary | ICD-10-CM | POA: Diagnosis not present

## 2017-02-03 DIAGNOSIS — R2681 Unsteadiness on feet: Secondary | ICD-10-CM | POA: Diagnosis not present

## 2017-02-03 DIAGNOSIS — M6281 Muscle weakness (generalized): Secondary | ICD-10-CM | POA: Diagnosis not present

## 2017-02-03 DIAGNOSIS — N3281 Overactive bladder: Secondary | ICD-10-CM | POA: Diagnosis not present

## 2017-02-03 DIAGNOSIS — R29898 Other symptoms and signs involving the musculoskeletal system: Secondary | ICD-10-CM | POA: Diagnosis not present

## 2017-02-04 DIAGNOSIS — M818 Other osteoporosis without current pathological fracture: Secondary | ICD-10-CM | POA: Diagnosis not present

## 2017-02-04 DIAGNOSIS — M6281 Muscle weakness (generalized): Secondary | ICD-10-CM | POA: Diagnosis not present

## 2017-02-04 DIAGNOSIS — N3281 Overactive bladder: Secondary | ICD-10-CM | POA: Diagnosis not present

## 2017-02-04 DIAGNOSIS — R55 Syncope and collapse: Secondary | ICD-10-CM | POA: Diagnosis not present

## 2017-02-04 DIAGNOSIS — R29898 Other symptoms and signs involving the musculoskeletal system: Secondary | ICD-10-CM | POA: Diagnosis not present

## 2017-02-04 DIAGNOSIS — Z9181 History of falling: Secondary | ICD-10-CM | POA: Diagnosis not present

## 2017-02-05 DIAGNOSIS — M818 Other osteoporosis without current pathological fracture: Secondary | ICD-10-CM | POA: Diagnosis not present

## 2017-02-05 DIAGNOSIS — Z9181 History of falling: Secondary | ICD-10-CM | POA: Diagnosis not present

## 2017-02-05 DIAGNOSIS — N3281 Overactive bladder: Secondary | ICD-10-CM | POA: Diagnosis not present

## 2017-02-05 DIAGNOSIS — R55 Syncope and collapse: Secondary | ICD-10-CM | POA: Diagnosis not present

## 2017-02-05 DIAGNOSIS — M6281 Muscle weakness (generalized): Secondary | ICD-10-CM | POA: Diagnosis not present

## 2017-02-05 DIAGNOSIS — R29898 Other symptoms and signs involving the musculoskeletal system: Secondary | ICD-10-CM | POA: Diagnosis not present

## 2017-02-06 DIAGNOSIS — R29898 Other symptoms and signs involving the musculoskeletal system: Secondary | ICD-10-CM | POA: Diagnosis not present

## 2017-02-06 DIAGNOSIS — R55 Syncope and collapse: Secondary | ICD-10-CM | POA: Diagnosis not present

## 2017-02-06 DIAGNOSIS — N3281 Overactive bladder: Secondary | ICD-10-CM | POA: Diagnosis not present

## 2017-02-06 DIAGNOSIS — M6281 Muscle weakness (generalized): Secondary | ICD-10-CM | POA: Diagnosis not present

## 2017-02-06 DIAGNOSIS — Z9181 History of falling: Secondary | ICD-10-CM | POA: Diagnosis not present

## 2017-02-06 DIAGNOSIS — M818 Other osteoporosis without current pathological fracture: Secondary | ICD-10-CM | POA: Diagnosis not present

## 2017-02-07 DIAGNOSIS — M6281 Muscle weakness (generalized): Secondary | ICD-10-CM | POA: Diagnosis not present

## 2017-02-07 DIAGNOSIS — Z9181 History of falling: Secondary | ICD-10-CM | POA: Diagnosis not present

## 2017-02-07 DIAGNOSIS — N3281 Overactive bladder: Secondary | ICD-10-CM | POA: Diagnosis not present

## 2017-02-07 DIAGNOSIS — M818 Other osteoporosis without current pathological fracture: Secondary | ICD-10-CM | POA: Diagnosis not present

## 2017-02-07 DIAGNOSIS — R55 Syncope and collapse: Secondary | ICD-10-CM | POA: Diagnosis not present

## 2017-02-07 DIAGNOSIS — R29898 Other symptoms and signs involving the musculoskeletal system: Secondary | ICD-10-CM | POA: Diagnosis not present

## 2017-02-10 DIAGNOSIS — Z9181 History of falling: Secondary | ICD-10-CM | POA: Diagnosis not present

## 2017-02-10 DIAGNOSIS — M6281 Muscle weakness (generalized): Secondary | ICD-10-CM | POA: Diagnosis not present

## 2017-02-10 DIAGNOSIS — N3281 Overactive bladder: Secondary | ICD-10-CM | POA: Diagnosis not present

## 2017-02-10 DIAGNOSIS — M818 Other osteoporosis without current pathological fracture: Secondary | ICD-10-CM | POA: Diagnosis not present

## 2017-02-10 DIAGNOSIS — R55 Syncope and collapse: Secondary | ICD-10-CM | POA: Diagnosis not present

## 2017-02-10 DIAGNOSIS — R29898 Other symptoms and signs involving the musculoskeletal system: Secondary | ICD-10-CM | POA: Diagnosis not present

## 2017-02-12 DIAGNOSIS — R55 Syncope and collapse: Secondary | ICD-10-CM | POA: Diagnosis not present

## 2017-02-12 DIAGNOSIS — M6281 Muscle weakness (generalized): Secondary | ICD-10-CM | POA: Diagnosis not present

## 2017-02-12 DIAGNOSIS — M818 Other osteoporosis without current pathological fracture: Secondary | ICD-10-CM | POA: Diagnosis not present

## 2017-02-12 DIAGNOSIS — Z9181 History of falling: Secondary | ICD-10-CM | POA: Diagnosis not present

## 2017-02-12 DIAGNOSIS — R29898 Other symptoms and signs involving the musculoskeletal system: Secondary | ICD-10-CM | POA: Diagnosis not present

## 2017-02-12 DIAGNOSIS — N3281 Overactive bladder: Secondary | ICD-10-CM | POA: Diagnosis not present

## 2017-02-14 DIAGNOSIS — R55 Syncope and collapse: Secondary | ICD-10-CM | POA: Diagnosis not present

## 2017-02-14 DIAGNOSIS — M6281 Muscle weakness (generalized): Secondary | ICD-10-CM | POA: Diagnosis not present

## 2017-02-14 DIAGNOSIS — N3281 Overactive bladder: Secondary | ICD-10-CM | POA: Diagnosis not present

## 2017-02-14 DIAGNOSIS — Z9181 History of falling: Secondary | ICD-10-CM | POA: Diagnosis not present

## 2017-02-14 DIAGNOSIS — R29898 Other symptoms and signs involving the musculoskeletal system: Secondary | ICD-10-CM | POA: Diagnosis not present

## 2017-02-14 DIAGNOSIS — M818 Other osteoporosis without current pathological fracture: Secondary | ICD-10-CM | POA: Diagnosis not present

## 2017-02-17 DIAGNOSIS — R55 Syncope and collapse: Secondary | ICD-10-CM | POA: Diagnosis not present

## 2017-02-17 DIAGNOSIS — N3281 Overactive bladder: Secondary | ICD-10-CM | POA: Diagnosis not present

## 2017-02-17 DIAGNOSIS — R29898 Other symptoms and signs involving the musculoskeletal system: Secondary | ICD-10-CM | POA: Diagnosis not present

## 2017-02-17 DIAGNOSIS — Z9181 History of falling: Secondary | ICD-10-CM | POA: Diagnosis not present

## 2017-02-17 DIAGNOSIS — M818 Other osteoporosis without current pathological fracture: Secondary | ICD-10-CM | POA: Diagnosis not present

## 2017-02-17 DIAGNOSIS — M6281 Muscle weakness (generalized): Secondary | ICD-10-CM | POA: Diagnosis not present

## 2017-02-19 DIAGNOSIS — M6281 Muscle weakness (generalized): Secondary | ICD-10-CM | POA: Diagnosis not present

## 2017-02-19 DIAGNOSIS — R29898 Other symptoms and signs involving the musculoskeletal system: Secondary | ICD-10-CM | POA: Diagnosis not present

## 2017-02-19 DIAGNOSIS — R55 Syncope and collapse: Secondary | ICD-10-CM | POA: Diagnosis not present

## 2017-02-19 DIAGNOSIS — N3281 Overactive bladder: Secondary | ICD-10-CM | POA: Diagnosis not present

## 2017-02-19 DIAGNOSIS — Z9181 History of falling: Secondary | ICD-10-CM | POA: Diagnosis not present

## 2017-02-19 DIAGNOSIS — M818 Other osteoporosis without current pathological fracture: Secondary | ICD-10-CM | POA: Diagnosis not present

## 2017-02-20 DIAGNOSIS — M6281 Muscle weakness (generalized): Secondary | ICD-10-CM | POA: Diagnosis not present

## 2017-02-20 DIAGNOSIS — M818 Other osteoporosis without current pathological fracture: Secondary | ICD-10-CM | POA: Diagnosis not present

## 2017-02-20 DIAGNOSIS — R55 Syncope and collapse: Secondary | ICD-10-CM | POA: Diagnosis not present

## 2017-02-20 DIAGNOSIS — R29898 Other symptoms and signs involving the musculoskeletal system: Secondary | ICD-10-CM | POA: Diagnosis not present

## 2017-02-20 DIAGNOSIS — Z9181 History of falling: Secondary | ICD-10-CM | POA: Diagnosis not present

## 2017-02-20 DIAGNOSIS — N3281 Overactive bladder: Secondary | ICD-10-CM | POA: Diagnosis not present

## 2017-02-24 DIAGNOSIS — M6281 Muscle weakness (generalized): Secondary | ICD-10-CM | POA: Diagnosis not present

## 2017-02-24 DIAGNOSIS — Z9181 History of falling: Secondary | ICD-10-CM | POA: Diagnosis not present

## 2017-02-24 DIAGNOSIS — R55 Syncope and collapse: Secondary | ICD-10-CM | POA: Diagnosis not present

## 2017-02-24 DIAGNOSIS — N3281 Overactive bladder: Secondary | ICD-10-CM | POA: Diagnosis not present

## 2017-02-24 DIAGNOSIS — M818 Other osteoporosis without current pathological fracture: Secondary | ICD-10-CM | POA: Diagnosis not present

## 2017-02-24 DIAGNOSIS — R29898 Other symptoms and signs involving the musculoskeletal system: Secondary | ICD-10-CM | POA: Diagnosis not present

## 2017-02-26 DIAGNOSIS — R29898 Other symptoms and signs involving the musculoskeletal system: Secondary | ICD-10-CM | POA: Diagnosis not present

## 2017-02-26 DIAGNOSIS — Z9181 History of falling: Secondary | ICD-10-CM | POA: Diagnosis not present

## 2017-02-26 DIAGNOSIS — M6281 Muscle weakness (generalized): Secondary | ICD-10-CM | POA: Diagnosis not present

## 2017-02-26 DIAGNOSIS — R55 Syncope and collapse: Secondary | ICD-10-CM | POA: Diagnosis not present

## 2017-02-26 DIAGNOSIS — M818 Other osteoporosis without current pathological fracture: Secondary | ICD-10-CM | POA: Diagnosis not present

## 2017-02-26 DIAGNOSIS — N3281 Overactive bladder: Secondary | ICD-10-CM | POA: Diagnosis not present

## 2017-02-27 DIAGNOSIS — M6281 Muscle weakness (generalized): Secondary | ICD-10-CM | POA: Diagnosis not present

## 2017-02-27 DIAGNOSIS — M818 Other osteoporosis without current pathological fracture: Secondary | ICD-10-CM | POA: Diagnosis not present

## 2017-02-27 DIAGNOSIS — N3281 Overactive bladder: Secondary | ICD-10-CM | POA: Diagnosis not present

## 2017-02-27 DIAGNOSIS — R55 Syncope and collapse: Secondary | ICD-10-CM | POA: Diagnosis not present

## 2017-02-27 DIAGNOSIS — Z9181 History of falling: Secondary | ICD-10-CM | POA: Diagnosis not present

## 2017-02-27 DIAGNOSIS — R29898 Other symptoms and signs involving the musculoskeletal system: Secondary | ICD-10-CM | POA: Diagnosis not present

## 2017-02-28 DIAGNOSIS — M818 Other osteoporosis without current pathological fracture: Secondary | ICD-10-CM | POA: Diagnosis not present

## 2017-02-28 DIAGNOSIS — M6281 Muscle weakness (generalized): Secondary | ICD-10-CM | POA: Diagnosis not present

## 2017-02-28 DIAGNOSIS — R29898 Other symptoms and signs involving the musculoskeletal system: Secondary | ICD-10-CM | POA: Diagnosis not present

## 2017-02-28 DIAGNOSIS — N3281 Overactive bladder: Secondary | ICD-10-CM | POA: Diagnosis not present

## 2017-02-28 DIAGNOSIS — Z9181 History of falling: Secondary | ICD-10-CM | POA: Diagnosis not present

## 2017-02-28 DIAGNOSIS — R55 Syncope and collapse: Secondary | ICD-10-CM | POA: Diagnosis not present

## 2017-03-03 ENCOUNTER — Non-Acute Institutional Stay: Payer: Medicare Other | Admitting: Internal Medicine

## 2017-03-03 ENCOUNTER — Encounter: Payer: Self-pay | Admitting: Internal Medicine

## 2017-03-03 DIAGNOSIS — R29898 Other symptoms and signs involving the musculoskeletal system: Secondary | ICD-10-CM | POA: Diagnosis not present

## 2017-03-03 DIAGNOSIS — N3281 Overactive bladder: Secondary | ICD-10-CM

## 2017-03-03 DIAGNOSIS — J45909 Unspecified asthma, uncomplicated: Secondary | ICD-10-CM | POA: Diagnosis not present

## 2017-03-03 DIAGNOSIS — E039 Hypothyroidism, unspecified: Secondary | ICD-10-CM

## 2017-03-03 DIAGNOSIS — K219 Gastro-esophageal reflux disease without esophagitis: Secondary | ICD-10-CM

## 2017-03-03 DIAGNOSIS — Z9181 History of falling: Secondary | ICD-10-CM | POA: Diagnosis not present

## 2017-03-03 DIAGNOSIS — R55 Syncope and collapse: Secondary | ICD-10-CM | POA: Diagnosis not present

## 2017-03-03 DIAGNOSIS — M6281 Muscle weakness (generalized): Secondary | ICD-10-CM | POA: Diagnosis not present

## 2017-03-03 DIAGNOSIS — M818 Other osteoporosis without current pathological fracture: Secondary | ICD-10-CM | POA: Diagnosis not present

## 2017-03-03 NOTE — Progress Notes (Signed)
Location:  [place] Room Number: 11 Place of Service:  ALF [phone]) Provider:  Blanchie Serve MD  Blanchie Serve, MD  Patient Care Team: Blanchie Serve, MD as PCP - General (Internal Medicine) Newton Pigg, MD as Consulting Physician (Obstetrics and Gynecology) Monna Fam, MD as Consulting Physician (Ophthalmology) Melida Quitter, MD as Consulting Physician (Otolaryngology) Myrlene Broker, MD as Consulting Physician (Urology) Melina Modena, [place], Nelda Bucks, NP as Nurse Practitioner (Family Medicine)  Extended Emergency Contact Information Primary Emergency Contact: Topper,[name] Address: [place], [place] Phone: [phone] Work Phone: [phone] Mobile Phone: [phone] Relation: Son Secondary Emergency Contact: Teofilo Pod [place] of [place] Phone: [phone] Work Phone: [phone] Relation: Friend  Code Status:  DNR  Goals of care: Advanced Directive information Advanced Directives 03/03/2017  Does Patient Have a Medical Advance Directive? Yes  Type of Paramedic of [place];Living will;Out of facility DNR (pink MOST or yellow form)  Does patient want to make changes to medical advance directive? No - Patient declined  Copy of [place] in Chart? Yes  Pre-existing out of facility DNR order (yellow form or pink MOST form) Yellow form placed in chart (order not valid for inpatient use);Pink MOST form placed in chart (order not valid for inpatient use)     Chief Complaint  Patient presents with  . Medical Management of Chronic Issues    Routine Visit     HPI:  Pt is a 84 y.o. female seen today for medical management of chronic diseases.  She is seen in her room with charge nurse present. She denies any concerns. Per nursing, she has been requiring increased assistance with her ADLs. She has a cough during  conversation but denies any worsening cough or dyspnea. She has walked from the bathroom to her room and appears to have some dyspnea. She is compliant with her medications.    Past Medical History:  Diagnosis Date  . Asthma   . Carpal tunnel syndrome 03/07/2009  . Disturbance of skin sensation 02/21/2009  . Diverticulosis of colon (without mention of hemorrhage) 02/16/2009  . Fracture of upper end of left humerus 11/18/14  . 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 Hypothyroid   . Osteoarthrosis, unspecified whether generalized or localized, unspecified site 02/16/2009  . Other abnormal blood chemistry 06/19/2010   hyperglycemia  . Other and unspecified hyperlipidemia 10/24/2009  . Other atopic dermatitis and related conditions 02/16/2009  . Otosclerosis, unspecified 02/22/2003  . Pain in limb 12/05/2009  . Senile osteoporosis 06/16/2010  . Sensorineural hearing loss, unspecified 02/21/2009  . Syncope and collapse 05/30/2009  . Unspecified nasal polyp 02/21/2009  . Unspecified urinary incontinence 02/21/2009   Past Surgical History:  Procedure Laterality Date  . ABDOMINAL HYSTERECTOMY  1999   Dr. Collier Bullock  . BREAST BIOPSY  07/16/1990   benign left breast needle biopsy  Dr. Margot Chimes  . CARPAL TUNNEL RELEASE  04/07/2009   right Dr. Daylene Katayama  . CATARACT EXTRACTION W/ INTRAOCULAR LENS  IMPLANT, BILATERAL Bilateral   . TRIGGER FINGER RELEASE Left 08/09/2014   Procedure: RELEASE TRIGGER FINGER/A-1 PULLEY LEFT MIDDLE FINGER;  Surgeon: Daryll Brod, MD;  Location: [place];  Service: Orthopedics;  Laterality: Left;  REGIONAL/FAB    Allergies  Allergen Reactions  . Avelox [Moxifloxacin Hcl In Nacl] Itching  . Doxycycline   . Hista-Tabs [Triprolidine-Pse] Other (See  Comments)    Passed out  . Septra [Sulfamethoxazole-Trimethoprim]   . Sulfa Antibiotics     Itching/rash (all over)    Outpatient Encounter Medications as of 03/03/2017  Medication Sig  . acetaminophen (TYLENOL) 325 MG tablet  Take 2 tablets (650 mg total) by mouth every 6 (six) hours as needed for mild pain or moderate pain.  Marland Kitchen albuterol (VENTOLIN HFA) 108 (90 Base) MCG/ACT inhaler Inhale 2 puffs by mouth every 6 hours as needed for shortness of breath  . escitalopram (LEXAPRO) 10 MG tablet Take 10 mg by mouth daily.  . feeding supplement (BOOST HIGH PROTEIN) LIQD Take 1 Container by mouth 2 (two) times daily between meals.   . Fluticasone-Salmeterol (ADVAIR) 250-50 MCG/DOSE AEPB Inhale 1 puff 2 (two) times daily into the lungs. Rinse mouth after use  . furosemide (LASIX) 40 MG tablet Take 40 mg by mouth every morning.  Marland Kitchen ipratropium (ATROVENT) 0.02 % nebulizer solution Take 0.5 mg by nebulization every 6 (six) hours as needed for wheezing or shortness of breath.  . levothyroxine (SYNTHROID, LEVOTHROID) 25 MCG tablet Take 25 mcg by mouth daily before breakfast.  . mirtazapine (REMERON) 15 MG tablet Take 15 mg by mouth at bedtime.  . montelukast (SINGULAIR) 10 MG tablet Take one tablet by mouth once daily to help breathing  . MYRBETRIQ 25 MG TB24 tablet TAKE ONE TABLET BY MOUTH ONCE DAILY  . OXYGEN Inhale 2 L/min into the lungs as needed. Use as needed to keep O2 sats > 90%  . raloxifene (EVISTA) 60 MG tablet Take 1 tablet by mouth  daily to treat osteoporosis  . ranitidine (ZANTAC) 150 MG capsule Take 150 mg by mouth daily.  Marland Kitchen ZINC OXIDE EX Apply 1 application topically 2 (two) times daily.   . [DISCONTINUED] fluticasone (FLONASE) 50 MCG/ACT nasal spray Place 2 sprays into both nostrils daily.  . [DISCONTINUED] promethazine (PHENERGAN) 12.5 MG suppository Place 12.5 mg rectally every 4 (four) hours as needed for nausea or vomiting.   No facility-administered encounter medications on file as of 03/03/2017.     Review of Systems  Constitutional: Negative for appetite change, chills, diaphoresis and fever.  HENT: Negative for congestion, ear pain, mouth sores, rhinorrhea and trouble swallowing.   Eyes: Negative for  discharge and itching.  Respiratory: Positive for cough and shortness of breath. Negative for chest tightness and wheezing.   Cardiovascular: Negative for chest pain and palpitations.  Gastrointestinal: Negative for abdominal pain, constipation, diarrhea, nausea and vomiting.  Genitourinary: Negative for dysuria and hematuria.  Musculoskeletal: Positive for gait problem. Negative for arthralgias and back pain.  Skin: Negative for rash.  Neurological: Negative for dizziness, numbness and headaches.  Psychiatric/Behavioral: Positive for confusion.    Immunization History  Administered Date(s) Administered  . Influenza Whole 12/27/2011, 11/05/2012  . Influenza-Unspecified 11/18/2013, 11/03/2014, 11/16/2015, 11/13/2016  . PPD Test 04/04/2008, 11/23/2014  . Pneumococcal Conjugate-13 09/30/2016  . Pneumococcal Polysaccharide-23 02/05/2008, 06/03/2008  . Td 02/05/2008, 06/03/2008   Pertinent  Health Maintenance Due  Topic Date Due  . INFLUENZA VACCINE  Completed  . DEXA SCAN  Completed  . PNA vac Low Risk Adult  Completed   Fall Risk  09/23/2016 08/27/2016 05/30/2015 05/16/2015 04/25/2015  Falls in the past year? Yes Yes Yes No No  Comment - Emmi Telephone Survey: data to providers prior to load - - -  Number falls in past yr: 1 1 1  - -  Comment - Emmi Telephone Survey Actual Response = 1 - - -  Injury with Fall? No No Yes - -  Comment - - - - -  Risk Factor Category  - - - - -  Risk for fall due to : - - History of fall(s);Impaired balance/gait;Impaired mobility - -  Follow up - - Falls evaluation completed;Education provided - -   Functional Status Survey:    Vitals:   03/03/17 1210  BP: (!) 134/54  Pulse: 71  Resp: 16  Temp: (!) 96.8 F (36 C)  TempSrc: Oral  SpO2: 90%  Weight: 128 lb 8 oz (58.3 kg)  Height: 4\' 11"  (1.499 m)   Body mass index is 25.95 kg/m.   Wt Readings from Last 3 Encounters:  03/03/17 128 lb 8 oz (58.3 kg)  12/17/16 126 lb 8 oz (57.4 kg)  12/02/16  127 lb (57.6 kg)   Physical Exam  Constitutional: She appears well-developed and well-nourished. No distress.  HENT:  Head: Normocephalic and atraumatic.  Mouth/Throat: No oropharyngeal exudate.  Mild erythema of oropharynx  Eyes: Conjunctivae and EOM are normal. Pupils are equal, round, and reactive to light. Right eye exhibits no discharge. Left eye exhibits no discharge.  Neck: Neck supple.  Cardiovascular: Normal rate and regular rhythm.  Pulmonary/Chest: No respiratory distress. She exhibits no tenderness.  Poor air movement, few scattered wheezing, no rales. Increased breathing effort post mild exertion and this improves with rest  Abdominal: Soft. Bowel sounds are normal. There is no tenderness.  Musculoskeletal: She exhibits edema and deformity.  Unsteady gait, uses a walker, arthritis changes, trace leg edema  Lymphadenopathy:    She has no cervical adenopathy.  Neurological: She is alert.  Oriented to person and place but not to time  Skin: Skin is warm and dry. No rash noted. She is not diaphoretic.  Psychiatric: She has a normal mood and affect.    Labs reviewed: Recent Labs    07/15/16 12/05/16 01/14/17  NA 140 145 138  K 3.9 4.1 4.5  BUN 14 23* 23*  CREATININE 0.9 1.0 1.2*   Recent Labs    07/15/16 12/05/16 01/14/17  AST 16 17 16   ALT 11 9 14   ALKPHOS 50 58 60   Recent Labs    07/15/16 12/05/16 01/14/17  WBC 7.7 7.3 9.3  NEUTROABS  --  4,132  --   HGB 11.9* 12.7 12.7  HCT 36 39 38  PLT 278 272 291   Lab Results  Component Value Date   TSH 1.32 07/15/2016   Lab Results  Component Value Date   HGBA1C 6.1 05/13/2016   Lab Results  Component Value Date   CHOL 211 (A) 02/14/2014   HDL 47 02/14/2014   LDLCALC 133 02/14/2014   TRIG 155 02/14/2014    Significant Diagnostic Results in last 30 days:  No results found.  Assessment/Plan  Chronic asthma Has cough and wheezing on exam. Recommended duoneb scheduled for few days with incentive  spirometer and cough expectorant. Pt agrees to cough expectorant but refuses other treatment. Refuses to wear her oxygen. Continue advair for now with singulair. Monitor for signs of exacerbation  OAB Has occasional episodes of incontinence, continue myrbetriq 25 mg daily  Hypothyroidism Lab Results  Component Value Date   TSH 1.32 07/15/2016   Check TSH. Continue levothyroxine 25 mcg daily for now.  gerd Controlled symptom. Continue ranitidine for now.   Dementia without behavioral disturbance MMSE 22/30 in 8/18. Currently on medication for hypothyroidism. CT head 2016 shows chronic small vessel ischemic changes in white matter. Likely  has vascular dementia with some component of ongoing hypoxia. Overall controlled BP on chart review. Supportive care.    Family/ staff Communication: reviewed care plan with patient and charge nurse.    Labs/tests ordered:  TSH, b12 next lab   [facility], MD Internal Medicine [facility] [place], [place] [address] Cell Phone (Monday-Friday 8 am - 5 pm): [phone] On Call: [phone] and follow prompts after 5 pm and on weekends Office Phone: [phone] Office Fax: [phone]

## 2017-03-05 DIAGNOSIS — R29898 Other symptoms and signs involving the musculoskeletal system: Secondary | ICD-10-CM | POA: Diagnosis not present

## 2017-03-05 DIAGNOSIS — Z9181 History of falling: Secondary | ICD-10-CM | POA: Diagnosis not present

## 2017-03-05 DIAGNOSIS — R55 Syncope and collapse: Secondary | ICD-10-CM | POA: Diagnosis not present

## 2017-03-05 DIAGNOSIS — N3281 Overactive bladder: Secondary | ICD-10-CM | POA: Diagnosis not present

## 2017-03-05 DIAGNOSIS — M6281 Muscle weakness (generalized): Secondary | ICD-10-CM | POA: Diagnosis not present

## 2017-03-05 DIAGNOSIS — M818 Other osteoporosis without current pathological fracture: Secondary | ICD-10-CM | POA: Diagnosis not present

## 2017-03-06 DIAGNOSIS — D519 Vitamin B12 deficiency anemia, unspecified: Secondary | ICD-10-CM | POA: Diagnosis not present

## 2017-03-06 DIAGNOSIS — E039 Hypothyroidism, unspecified: Secondary | ICD-10-CM | POA: Diagnosis not present

## 2017-05-06 DIAGNOSIS — N390 Urinary tract infection, site not specified: Secondary | ICD-10-CM | POA: Diagnosis not present

## 2017-05-06 DIAGNOSIS — R05 Cough: Secondary | ICD-10-CM | POA: Diagnosis not present

## 2017-05-29 ENCOUNTER — Encounter: Payer: Self-pay | Admitting: Family

## 2017-05-29 ENCOUNTER — Non-Acute Institutional Stay: Payer: Medicare Other | Admitting: Family

## 2017-05-29 DIAGNOSIS — F339 Major depressive disorder, recurrent, unspecified: Secondary | ICD-10-CM | POA: Diagnosis not present

## 2017-05-29 DIAGNOSIS — E039 Hypothyroidism, unspecified: Secondary | ICD-10-CM

## 2017-05-29 DIAGNOSIS — R609 Edema, unspecified: Secondary | ICD-10-CM | POA: Diagnosis not present

## 2017-05-29 DIAGNOSIS — N3281 Overactive bladder: Secondary | ICD-10-CM

## 2017-05-29 DIAGNOSIS — K219 Gastro-esophageal reflux disease without esophagitis: Secondary | ICD-10-CM | POA: Diagnosis not present

## 2017-05-29 NOTE — Progress Notes (Signed)
Location:  [facility] Room Number: 11 Place of Service:  ALF [phone]) Provider: Shakemia Madera FNP-C   Blanchie Serve, MD  Patient Care Team: Blanchie Serve, MD as PCP - General (Internal Medicine) Newton Pigg, MD as Consulting Physician (Obstetrics and Gynecology) Monna Fam, MD as Consulting Physician (Ophthalmology) Melida Quitter, MD as Consulting Physician (Otolaryngology) Myrlene Broker, MD as Consulting Physician (Urology) Melina Modena, [place], Nelda Bucks, NP as Nurse Practitioner (Family Medicine)  Extended Emergency Contact Information Primary Emergency Contact: Legere,[name] Address: [place], [place] Phone: [phone] Work Phone: [phone] Mobile Phone: [phone] Relation: Son Secondary Emergency Contact: Teofilo Pod [place] of [place] Phone: [phone] Work Phone: [phone] Relation: Friend  Code Status: DNR  Goals of care: Advanced Directive information Advanced Directives 05/29/2017  Does Patient Have a Medical Advance Directive? Yes  Type of Paramedic of [place];Out of facility DNR (pink MOST or yellow form);Living will  Does patient want to make changes to medical advance directive? -  Copy of [place] in Chart? -  Pre-existing out of facility DNR order (yellow form or pink MOST form) Yellow form placed in chart (order not valid for inpatient use);Pink MOST form placed in chart (order not valid for inpatient use)     Chief Complaint  Patient presents with  . Medical Management of Chronic Issues    HPI:  Pt is a 84 y.o. female seen today [facility] for medical management of chronic diseases.she has a medical history of Asthma,Hypothyroidism,depression,OA,Osteoporosis,OAB among to her conditions.she is seen in her room today with facility Nurse supervisor present at bedside.Nurse reports no  new acute concerns.patient denies any acute issues during visit.Facility weight log reviewed no weight changes.No recent fall episodes.  Past Medical History:  Diagnosis Date  . Asthma   . Carpal tunnel syndrome 03/07/2009  . Disturbance of skin sensation 02/21/2009  . Diverticulosis of colon (without mention of hemorrhage) 02/16/2009  . Fracture of upper end of left humerus 11/18/14  . 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 Hypothyroid   . Osteoarthrosis, unspecified whether generalized or localized, unspecified site 02/16/2009  . Other abnormal blood chemistry 06/19/2010   hyperglycemia  . Other and unspecified hyperlipidemia 10/24/2009  . Other atopic dermatitis and related conditions 02/16/2009  . Otosclerosis, unspecified 02/22/2003  . Pain in limb 12/05/2009  . Senile osteoporosis 06/16/2010  . Sensorineural hearing loss, unspecified 02/21/2009  . Syncope and collapse 05/30/2009  . Unspecified nasal polyp 02/21/2009  . Unspecified urinary incontinence 02/21/2009   Past Surgical History:  Procedure Laterality Date  . ABDOMINAL HYSTERECTOMY  1999   Dr. Collier Bullock  . BREAST BIOPSY  07/16/1990   benign left breast needle biopsy  Dr. Margot Chimes  . CARPAL TUNNEL RELEASE  04/07/2009   right Dr. Daylene Katayama  . CATARACT EXTRACTION W/ INTRAOCULAR LENS  IMPLANT, BILATERAL Bilateral   . TRIGGER FINGER RELEASE Left 08/09/2014   Procedure: RELEASE TRIGGER FINGER/A-1 PULLEY LEFT MIDDLE FINGER;  Surgeon: Daryll Brod, MD;  Location: [facility];  Service: Orthopedics;  Laterality: Left;  REGIONAL/FAB    Allergies  Allergen Reactions  . Avelox [Moxifloxacin Hcl In Nacl] Itching  . Doxycycline   . Hista-Tabs [Triprolidine-Pse] Other (See Comments)    Passed out  . Septra [Sulfamethoxazole-Trimethoprim]   . Sulfa Antibiotics     Itching/rash (all over)    Allergies  as of 05/29/2017      Reactions   Avelox [moxifloxacin Hcl In Nacl] Itching   Doxycycline    Hista-tabs [triprolidine-pse] Other (See  Comments)   Passed out   Septra [sulfamethoxazole-trimethoprim]    Sulfa Antibiotics    Itching/rash (all over)      Medication List        Accurate as of 05/29/17  3:51 PM. Always use your most recent med list.          acetaminophen 325 MG tablet Commonly known as:  TYLENOL Take 2 tablets (650 mg total) by mouth every 6 (six) hours as needed for mild pain or moderate pain.   escitalopram 10 MG tablet Commonly known as:  LEXAPRO Take 10 mg by mouth daily.   feeding supplement Liqd Take 1 Container by mouth daily.   fluticasone 50 MCG/ACT nasal spray Commonly known as:  FLONASE Place 2 sprays into both nostrils daily.   Fluticasone-Salmeterol 250-50 MCG/DOSE Aepb Commonly known as:  ADVAIR Inhale 1 puff 2 (two) times daily into the lungs. Rinse mouth after use   furosemide 40 MG tablet Commonly known as:  LASIX Take 40 mg by mouth every morning.   ipratropium 0.02 % nebulizer solution Commonly known as:  ATROVENT Take 0.5 mg by nebulization every 6 (six) hours as needed for wheezing or shortness of breath.   levothyroxine 25 MCG tablet Commonly known as:  SYNTHROID, LEVOTHROID Take 25 mcg by mouth daily before breakfast.   mirtazapine 15 MG tablet Commonly known as:  REMERON Take 15 mg by mouth at bedtime.   montelukast 10 MG tablet Commonly known as:  SINGULAIR Take one tablet by mouth once daily to help breathing   MYRBETRIQ 25 MG Tb24 tablet Generic drug:  mirabegron ER TAKE ONE TABLET BY MOUTH ONCE DAILY   OXYGEN Inhale 2 L/min into the lungs as needed. Use as needed to keep O2 sats > 90%   raloxifene 60 MG tablet Commonly known as:  EVISTA Take 1 tablet by mouth  daily to treat osteoporosis   ranitidine 150 MG capsule Commonly known as:  ZANTAC Take 150 mg by mouth daily.   VENTOLIN HFA 108 (90 Base) MCG/ACT inhaler Generic drug:  albuterol Inhale 2 puffs by mouth every 6 hours as needed for shortness of breath   ZINC OXIDE EX Apply 1  application topically 2 (two) times daily.       Review of Systems  Constitutional: Negative for appetite change, chills, fatigue, fever and unexpected weight change.  HENT: Positive for hearing loss. Negative for congestion, rhinorrhea, sinus pressure, sinus pain, sneezing, sore throat and trouble swallowing.   Eyes: Positive for visual disturbance. Negative for discharge, redness and itching.       Wears eye glasses   Respiratory: Negative for cough, chest tightness, shortness of breath and wheezing.   Cardiovascular: Positive for leg swelling. Negative for chest pain and palpitations.  Gastrointestinal: Negative for abdominal distention, abdominal pain, constipation, diarrhea, nausea and vomiting.  Endocrine: Negative for cold intolerance, heat intolerance, polydipsia, polyphagia and polyuria.  Genitourinary: Negative for dysuria, flank pain, frequency and urgency.  Musculoskeletal: Positive for gait problem.  Skin: Negative for color change, pallor and rash.       Gluteal skin redness/purple color ,managed by facility Nurse reports much improvement with zinc oxide application.   Neurological: Negative for dizziness, light-headedness and headaches.  Psychiatric/Behavioral: Negative for agitation, confusion and sleep disturbance. The patient is not nervous/anxious.     Immunization History  Administered Date(s) Administered  . Influenza Whole 12/27/2011, 11/05/2012  . Influenza-Unspecified 11/18/2013, 11/03/2014, 11/16/2015, 11/13/2016  . PPD Test 04/04/2008, 11/23/2014  . Pneumococcal Conjugate-13 09/30/2016  . Pneumococcal Polysaccharide-23 02/05/2008, 06/03/2008  . Td 02/05/2008, 06/03/2008   Pertinent  Health Maintenance Due  Topic Date Due  . INFLUENZA VACCINE  09/04/2017  . DEXA SCAN  Completed  . PNA vac Low Risk Adult  Completed   Fall Risk  09/23/2016 08/27/2016 05/30/2015 05/16/2015 04/25/2015  Falls in the past year? Yes Yes Yes No No  Comment - Emmi Telephone Survey:  data to providers prior to load - - -  Number falls in past yr: 1 1 1  - -  Comment - Emmi Telephone Survey Actual Response = 1 - - -  Injury with Fall? No No Yes - -  Comment - - - - -  Risk Factor Category  - - - - -  Risk for fall due to : - - History of fall(s);Impaired balance/gait;Impaired mobility - -  Follow up - - Falls evaluation completed;Education provided - -    Vitals:   05/29/17 1113  BP: 134/64  Pulse: 64  Resp: 17  Temp: 98.9 F (37.2 C)  SpO2: 90%  Weight: 129 lb 12.8 oz (58.9 kg)  Height: 4\' 11"  (1.499 m)   Body mass index is 26.22 kg/m. Physical Exam  Constitutional: She appears well-developed.  Elderly in no acute distress   HENT:  Head: Normocephalic.  Right Ear: External ear normal.  Left Ear: External ear normal.  Mouth/Throat: Oropharynx is clear and moist. No oropharyngeal exudate.  Eyes: Pupils are equal, round, and reactive to light. Conjunctivae and EOM are normal. Right eye exhibits no discharge. Left eye exhibits no discharge. No scleral icterus.  Neck: Normal range of motion. No JVD present. No thyromegaly present.  Cardiovascular: Normal rate, regular rhythm, normal heart sounds and intact distal pulses. Exam reveals no gallop and no friction rub.  No murmur heard. Pulmonary/Chest: Effort normal and breath sounds normal. No stridor. No respiratory distress. She has no wheezes. She has no rales.  Abdominal: Soft. Bowel sounds are normal. She exhibits no distension and no mass. There is no tenderness. There is no rebound and no guarding.  Musculoskeletal: She exhibits no tenderness.  Moves x 4 extremities.Arthritic changes to fingers.Unsteady gait uses rolling walker.bilateral lower extremities pitting edema has improved.Knee high ted hose in place.   Lymphadenopathy:    She has no cervical adenopathy.  Neurological: Gait abnormal.  Alert and oriented to person and place  Skin: Skin is warm and dry. No rash noted. No pallor.  Bilateral gluteal  skin dark purple color much improvement from previous visit.No open ulcer noted.   Psychiatric: She has a normal mood and affect. Her behavior is normal.    Labs reviewed: Recent Labs    07/15/16 12/05/16 01/14/17  NA 140 145 138  K 3.9 4.1 4.5  BUN 14 23* 23*  CREATININE 0.9 1.0 1.2*   Recent Labs    07/15/16 12/05/16 01/14/17  AST 16 17 16   ALT 11 9 14   ALKPHOS 50 58 60   Recent Labs    07/15/16 12/05/16 01/14/17  WBC 7.7 7.3 9.3  NEUTROABS  --  4,132  --   HGB 11.9* 12.7 12.7  HCT 36 39 38  PLT 278 272 291   Lab Results  Component Value Date   TSH 1.32 07/15/2016   Lab Results  Component Value Date   HGBA1C 6.1 05/13/2016  Lab Results  Component Value Date   CHOL 211 (A) 02/14/2014   HDL 47 02/14/2014   LDLCALC 133 02/14/2014   TRIG 155 02/14/2014    Significant Diagnostic Results in last 30 days:  No results found.  Assessment/Plan 1. Depression Mood stable.continue on lexaPro 10 mg tablet daily.continue to monitor for mood changes.  2. Hypothyroidism Lab Results  Component Value Date   TSH 1.32 07/15/2016  Continue on levothyroxine 25 mcg tablet daily.check TSH level 06/02/2017.  3. Gastroesophageal reflux disease without esophagitis Stable.continue on Ranitidine 150 mg capsule daily.check CBC 06/02/2017.   4. OAB (overactive bladder) Continue on myrbetriq 25 mg tablet daily.  5. Edema No recent abrupt weight gain.Exam findings negative for cough,shortness of breath,wheezing or rales.bilateral lower extremities pitting edema has improved.continue with knee high ted hose on in the morning and off at bedtime.continue on Furosemide 40 mg tablet daily.check CMP 06/02/2017.   Family/ staff Communication: Reviewed plan of care with patient and facility Nurse supervisor   Labs/tests ordered: CBC,CMP and TSH level 06/02/2017.  Sandrea Hughs, NP

## 2017-06-02 DIAGNOSIS — R609 Edema, unspecified: Secondary | ICD-10-CM | POA: Diagnosis not present

## 2017-06-02 DIAGNOSIS — K219 Gastro-esophageal reflux disease without esophagitis: Secondary | ICD-10-CM | POA: Diagnosis not present

## 2017-06-02 DIAGNOSIS — E039 Hypothyroidism, unspecified: Secondary | ICD-10-CM | POA: Diagnosis not present

## 2017-06-02 LAB — COMPLETE METABOLIC PANEL WITH GFR
ALT: 8
AST: 12
Albumin: 3.9
Alkaline Phosphatase: 65
BUN: 18 (ref 4–21)
CALCIUM: 9.4
Creat: 0.97
EGFR (Non-African Amer.): 50
Glucose: 90
PROTEIN: 7.1
Potassium: 4.4
SODIUM: 144
Total Bilirubin: 0.4

## 2017-06-02 LAB — BASIC METABOLIC PANEL
Creatinine: 1 (ref 0.5–1.1)
GLUCOSE: 90
Potassium: 4.4 (ref 3.4–5.3)
SODIUM: 144 (ref 137–147)

## 2017-06-02 LAB — HEPATIC FUNCTION PANEL
ALK PHOS: 65 (ref 25–125)
ALT: 8 (ref 7–35)
AST: 12 — AB (ref 13–35)
BILIRUBIN, TOTAL: 0.4

## 2017-06-02 LAB — CBC AND DIFFERENTIAL
HCT: 42 (ref 36–46)
Hemoglobin: 14.1 (ref 12.0–16.0)
Platelets: 287 (ref 150–399)
WBC: 8.1

## 2017-06-02 LAB — CBC
HCT: 42
HGB: 14.1
MCV: 87.3
PLATELET COUNT: 287
WBC: 8.1

## 2017-06-02 LAB — TSH
TSH: 1.6 (ref 0.41–5.90)
TSH: 1.6

## 2017-06-03 ENCOUNTER — Encounter: Payer: Self-pay | Admitting: *Deleted

## 2017-07-17 ENCOUNTER — Non-Acute Institutional Stay: Payer: Medicare Other | Admitting: Family

## 2017-07-17 ENCOUNTER — Encounter: Payer: Self-pay | Admitting: Family

## 2017-07-17 DIAGNOSIS — R030 Elevated blood-pressure reading, without diagnosis of hypertension: Secondary | ICD-10-CM | POA: Diagnosis not present

## 2017-07-17 NOTE — Progress Notes (Signed)
Location:  [facility] Room Number: 11 Place of Service:  ALF [phone]) Provider: Dinah Ngetich FNP-C  Blanchie Serve, MD  Patient Care Team: Blanchie Serve, MD as PCP - General (Internal Medicine) Newton Pigg, MD as Consulting Physician (Obstetrics and Gynecology) Monna Fam, MD as Consulting Physician (Ophthalmology) Melida Quitter, MD as Consulting Physician (Otolaryngology) Myrlene Broker, MD as Consulting Physician (Urology) Melina Modena, [place], Nelda Bucks, NP as Nurse Practitioner (Family Medicine)  Extended Emergency Contact Information Primary Emergency Contact: Uzelac,[name] Address: [address], [place] Phone: [phone] Work Phone: [phone] Mobile Phone: [phone] Relation: Son Secondary Emergency Contact: Teofilo Pod [place] of [place] Phone: [phone] Work Phone: [phone] Relation: Friend  Code Status:  DNR Goals of care: Advanced Directive information Advanced Directives 07/17/2017  Does Patient Have a Medical Advance Directive? Yes  Type of Paramedic of [place];Out of facility DNR (pink MOST or yellow form);Living will  Does patient want to make changes to medical advance directive? -  Copy of [place] in Chart? Yes  Pre-existing out of facility DNR order (yellow form or pink MOST form) Yellow form placed in chart (order not valid for inpatient use);Pink MOST form placed in chart (order not valid for inpatient use)     Chief Complaint  Patient presents with  . Acute Visit    elevated BP    HPI:  Pt is a 84 y.o. female seen today at [facility] for an acute visit for evaluation of elevated blood pressure reading.she is seen in her room today with facility charge Nurse present at bedside.Nurse reports patient's blood pressure was 162/94.Patient denies any headache,dizziness,chest  pain,SOB,changes in vision or faintness.she further denies any pain.Blood pressure log reviewed overall readings ranging in the 110's/50's-150's/60's.  Past Medical History:  Diagnosis Date  . Asthma   . Carpal tunnel syndrome 03/07/2009  . Disturbance of skin sensation 02/21/2009  . Diverticulosis of colon (without mention of hemorrhage) 02/16/2009  . Fracture of upper end of left humerus 11/18/14  . 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 Hypothyroid   . Osteoarthrosis, unspecified whether generalized or localized, unspecified site 02/16/2009  . Other abnormal blood chemistry 06/19/2010   hyperglycemia  . Other and unspecified hyperlipidemia 10/24/2009  . Other atopic dermatitis and related conditions 02/16/2009  . Otosclerosis, unspecified 02/22/2003  . Pain in limb 12/05/2009  . Senile osteoporosis 06/16/2010  . Sensorineural hearing loss, unspecified 02/21/2009  . Syncope and collapse 05/30/2009  . Unspecified nasal polyp 02/21/2009  . Unspecified urinary incontinence 02/21/2009   Past Surgical History:  Procedure Laterality Date  . ABDOMINAL HYSTERECTOMY  1999   Dr. Collier Bullock  . BREAST BIOPSY  07/16/1990   benign left breast needle biopsy  Dr. Margot Chimes  . CARPAL TUNNEL RELEASE  04/07/2009   right Dr. Daylene Katayama  . CATARACT EXTRACTION W/ INTRAOCULAR LENS  IMPLANT, BILATERAL Bilateral   . TRIGGER FINGER RELEASE Left 08/09/2014   Procedure: RELEASE TRIGGER FINGER/A-1 PULLEY LEFT MIDDLE FINGER;  Surgeon: Daryll Brod, MD;  Location: [facility];  Service: Orthopedics;  Laterality: Left;  REGIONAL/FAB    Allergies  Allergen Reactions  . Avelox [Moxifloxacin Hcl In Nacl] Itching  . Doxycycline   . Hista-Tabs [Triprolidine-Pse] Other (See Comments)    Passed out  . Septra [Sulfamethoxazole-Trimethoprim]   . Sulfa Antibiotics     Itching/rash (all over)  Outpatient Encounter Medications as of 07/17/2017  Medication Sig  . acetaminophen (TYLENOL) 325 MG tablet Take 2 tablets (650 mg  total) by mouth every 6 (six) hours as needed for mild pain or moderate pain.  Marland Kitchen albuterol (VENTOLIN HFA) 108 (90 Base) MCG/ACT inhaler Inhale 2 puffs by mouth every 6 hours as needed for shortness of breath  . escitalopram (LEXAPRO) 10 MG tablet Take 10 mg by mouth daily.  . feeding supplement (BOOST HIGH PROTEIN) LIQD Take 1 Container by mouth daily.   . fluticasone (FLONASE) 50 MCG/ACT nasal spray Place 2 sprays into both nostrils daily.  . Fluticasone-Salmeterol (ADVAIR) 250-50 MCG/DOSE AEPB Inhale 1 puff 2 (two) times daily into the lungs. Rinse mouth after use  . furosemide (LASIX) 40 MG tablet Take 40 mg by mouth every morning.  Marland Kitchen ipratropium (ATROVENT) 0.02 % nebulizer solution Take 0.5 mg by nebulization every 6 (six) hours as needed for wheezing or shortness of breath.  . levothyroxine (SYNTHROID, LEVOTHROID) 25 MCG tablet Take 25 mcg by mouth daily before breakfast.  . mirtazapine (REMERON) 15 MG tablet Take 15 mg by mouth at bedtime.  . montelukast (SINGULAIR) 10 MG tablet Take one tablet by mouth once daily to help breathing  . MYRBETRIQ 25 MG TB24 tablet TAKE ONE TABLET BY MOUTH ONCE DAILY  . OXYGEN Inhale 2 L/min into the lungs as needed. Use as needed to keep O2 sats > 90%  . raloxifene (EVISTA) 60 MG tablet Take 1 tablet by mouth  daily to treat osteoporosis  . ranitidine (ZANTAC) 150 MG capsule Take 150 mg by mouth daily.  Marland Kitchen ZINC OXIDE EX Apply 1 application topically 2 (two) times daily.    No facility-administered encounter medications on file as of 07/17/2017.     Review of Systems  Reason unable to perform ROS: additional information provided by facility charge Nurse.  Constitutional: Negative for appetite change, chills, fatigue, fever and unexpected weight change.  HENT: Negative for congestion, rhinorrhea, sinus pressure, sinus pain, sneezing and sore throat.   Eyes: Negative for discharge, redness, itching and visual disturbance.  Respiratory: Negative for cough,  chest tightness, shortness of breath and wheezing.   Cardiovascular: Positive for leg swelling. Negative for chest pain and palpitations.  Gastrointestinal: Negative for abdominal distention, abdominal pain, constipation, diarrhea, nausea and vomiting.  Endocrine: Negative for cold intolerance, heat intolerance, polydipsia and polyphagia.  Genitourinary: Negative for dysuria, flank pain, frequency and urgency.  Musculoskeletal: Positive for gait problem.  Skin: Negative for color change, pallor, rash and wound.  Neurological: Negative for dizziness, light-headedness and headaches.  Psychiatric/Behavioral: Negative for agitation, confusion and sleep disturbance. The patient is not nervous/anxious.     Immunization History  Administered Date(s) Administered  . Influenza Whole 12/27/2011, 11/05/2012  . Influenza-Unspecified 11/18/2013, 11/03/2014, 11/16/2015, 11/13/2016  . PPD Test 04/04/2008, 11/23/2014  . Pneumococcal Conjugate-13 09/30/2016  . Pneumococcal Polysaccharide-23 02/05/2008, 06/03/2008  . Td 02/05/2008, 06/03/2008   Pertinent  Health Maintenance Due  Topic Date Due  . INFLUENZA VACCINE  09/04/2017  . DEXA SCAN  Completed  . PNA vac Low Risk Adult  Completed   Fall Risk  09/23/2016 08/27/2016 05/30/2015 05/16/2015 04/25/2015  Falls in the past year? Yes Yes Yes No No  Comment - Emmi Telephone Survey: data to providers prior to load - - -  Number falls in past yr: 1 1 1  - -  Comment - Emmi Telephone Survey Actual Response = 1 - - -  Injury with Fall? No No Yes - -  Comment - - - - -  Risk Factor Category  - - - - -  Risk for fall due to : - - History of fall(s);Impaired balance/gait;Impaired mobility - -  Follow up - - Falls evaluation completed;Education provided - -   Functional Status Survey:    Vitals:   07/17/17 1026  BP: (!) 162/94  Pulse: 60  Resp: 18  Temp: (!) 97.4 F (36.3 C)  SpO2: 92%  Weight: 135 lb 6.4 oz (61.4 kg)  Height: 4\' 11"  (1.499 m)   Body  mass index is 27.35 kg/m. Physical Exam  Constitutional: She is oriented to person, place, and time. She appears well-developed and well-nourished.  Elderly in no acute distress   HENT:  Head: Normocephalic.  Mouth/Throat: Oropharynx is clear and moist. No oropharyngeal exudate.  Eyes: Pupils are equal, round, and reactive to light. Conjunctivae and EOM are normal. Right eye exhibits no discharge. Left eye exhibits no discharge. No scleral icterus.  Neck: Normal range of motion. No JVD present. No thyromegaly present.  Cardiovascular: Normal rate, regular rhythm, normal heart sounds and intact distal pulses. Exam reveals no gallop and no friction rub.  No murmur heard. Pulmonary/Chest: Effort normal and breath sounds normal. No respiratory distress. She has no wheezes. She has no rales.  Abdominal: Soft. Bowel sounds are normal. She exhibits no distension and no mass. There is no tenderness. There is no rebound and no guarding.  Musculoskeletal: She exhibits no tenderness.  Unsteady gait ambulates with Rolator.Bilateral lower extremities edema knee high ted hose in place.   Lymphadenopathy:    She has no cervical adenopathy.  Neurological: She is oriented to person, place, and time. Gait abnormal.  Skin: Skin is warm and dry. No rash noted. No erythema. No pallor.  Psychiatric: She has a normal mood and affect. Her speech is normal and behavior is normal. Judgment and thought content normal.  Nursing note and vitals reviewed.   Labs reviewed: Recent Labs    12/05/16 01/14/17 06/02/17  NA 145 138 144  K 4.1 4.5 4.4  BUN 23* 23* 18  CREATININE 1.0 1.2* 0.97  CALCIUM  --   --  9.4   Recent Labs    12/05/16 01/14/17 06/02/17  AST 17 16 12   ALT 9 14 8   ALKPHOS 58 60 65  BILITOT  --   --  0.4  ALBUMIN  --   --  3.9   Recent Labs    12/05/16 01/14/17 06/02/17  WBC 7.3 9.3 8.1  NEUTROABS 4,132  --   --   HGB 12.7 12.7 14.1  HCT 39 38 42  MCV  --   --  87.3  PLT 272 291  --     Lab Results  Component Value Date   TSH 1.60 06/02/2017   Lab Results  Component Value Date   HGBA1C 6.1 05/13/2016   Lab Results  Component Value Date   CHOL 211 (A) 02/14/2014   HDL 47 02/14/2014   LDLCALC 133 02/14/2014   TRIG 155 02/14/2014    Significant Diagnostic Results in last 30 days:  No results found.  Assessment/Plan   Elevated blood pressure reading B/p readings reviewed .overall B/p stables except x 1 episode elevated prior to visit 162/94.Asymptomatic.will continue to monitor daily for now.    Family/ staff Communication: Reviewed plan of care with patient and facility Nurse.   Labs/tests ordered: None   Dinah C Ngetich, NP

## 2017-07-25 DIAGNOSIS — H90A31 Mixed conductive and sensorineural hearing loss, unilateral, right ear with restricted hearing on the contralateral side: Secondary | ICD-10-CM | POA: Diagnosis not present

## 2017-07-25 DIAGNOSIS — Z822 Family history of deafness and hearing loss: Secondary | ICD-10-CM | POA: Diagnosis not present

## 2017-07-25 DIAGNOSIS — H90A22 Sensorineural hearing loss, unilateral, left ear, with restricted hearing on the contralateral side: Secondary | ICD-10-CM | POA: Diagnosis not present

## 2017-08-25 ENCOUNTER — Non-Acute Institutional Stay: Payer: Medicare Other | Admitting: Internal Medicine

## 2017-08-25 ENCOUNTER — Encounter: Payer: Self-pay | Admitting: Internal Medicine

## 2017-08-25 DIAGNOSIS — K219 Gastro-esophageal reflux disease without esophagitis: Secondary | ICD-10-CM | POA: Diagnosis not present

## 2017-08-25 DIAGNOSIS — N3281 Overactive bladder: Secondary | ICD-10-CM

## 2017-08-25 DIAGNOSIS — I739 Peripheral vascular disease, unspecified: Secondary | ICD-10-CM | POA: Diagnosis not present

## 2017-08-25 DIAGNOSIS — H903 Sensorineural hearing loss, bilateral: Secondary | ICD-10-CM | POA: Diagnosis not present

## 2017-08-25 DIAGNOSIS — E039 Hypothyroidism, unspecified: Secondary | ICD-10-CM | POA: Diagnosis not present

## 2017-08-25 DIAGNOSIS — F329 Major depressive disorder, single episode, unspecified: Secondary | ICD-10-CM

## 2017-08-25 DIAGNOSIS — J45909 Unspecified asthma, uncomplicated: Secondary | ICD-10-CM

## 2017-08-25 NOTE — Progress Notes (Signed)
[facility]  Provider: Blanchie Serve MD   Location:  [place] Room Number: 11 Place of Service:  ALF [phone])  PCP: Blanchie Serve, MD Patient Care Team: Blanchie Serve, MD as PCP - General (Internal Medicine) Newton Pigg, MD as Consulting Physician (Obstetrics and Gynecology) Monna Fam, MD as Consulting Physician (Ophthalmology) Melida Quitter, MD as Consulting Physician (Otolaryngology) Myrlene Broker, MD as Consulting Physician (Urology) Melina Modena, [place], Nelda Bucks, NP as Nurse Practitioner (Family Medicine)  Extended Emergency Contact Information Primary Emergency Contact: Mac,[name] Address: [place], [place] Phone: [phone] Work Phone: [phone] Mobile Phone: [phone] Relation: Son Secondary Emergency Contact: Teofilo [place] of [place] Phone: [phone] Work Phone: [phone] Relation: Friend  Code Status: DNR  Goals of Care: Advanced Directive information Advanced Directives 08/25/2017  Does Patient Have a Medical Advance Directive? Yes  Type of Paramedic of [place];Living will;Out of facility DNR (pink MOST or yellow form)  Does patient want to make changes to medical advance directive? No - Patient declined  Copy of [place] in Chart? Yes  Pre-existing out of facility DNR order (yellow form or pink MOST form) Yellow form placed in chart (order not valid for inpatient use);Pink MOST form placed in chart (order not valid for inpatient use)      Chief Complaint  Patient presents with  . Medical Management of Chronic Issues    Routine Visit     HPI: Patient is a 84 y.o. Greer seen today for routine visit. She denies any concern this visit. She gets around with her walker. No fall reported. Her bretahing has been stable. She is on singulair. She takes myrbetriq daily  for OAB. Tolerating levothyroxine well. Denies pain this visit. Denies reflux symptom. On ranitidine. Appetite and mood stable per nursing. Takes remeron on daily basis. Also on escitalopram.   Past Medical History:  Diagnosis Date  . Asthma   . Carpal tunnel syndrome 03/07/2009  . Disturbance of skin sensation 02/21/2009  . Diverticulosis of colon (without mention of hemorrhage) 02/16/2009  . Fracture of upper end of left humerus 11/18/14  . 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 Hypothyroid   . Osteoarthrosis, unspecified whether generalized or localized, unspecified site 02/16/2009  . Other abnormal blood chemistry 06/19/2010   hyperglycemia  . Other and unspecified hyperlipidemia 10/24/2009  . Other atopic dermatitis and related conditions 02/16/2009  . Otosclerosis, unspecified 02/22/2003  . Pain in limb 12/05/2009  . Senile osteoporosis 06/16/2010  . Sensorineural hearing loss, unspecified 02/21/2009  . Syncope and collapse 05/30/2009  . Unspecified nasal polyp 02/21/2009  . Unspecified urinary incontinence 02/21/2009   Past Surgical History:  Procedure Laterality Date  . ABDOMINAL HYSTERECTOMY  1999   Dr. Collier Bullock  . BREAST BIOPSY  07/16/1990   benign left breast needle biopsy  Dr. Margot Chimes  . CARPAL TUNNEL RELEASE  04/07/2009   right Dr. Daylene Katayama  . CATARACT EXTRACTION W/ INTRAOCULAR LENS  IMPLANT, BILATERAL Bilateral   . TRIGGER FINGER RELEASE Left 08/09/2014   Procedure: RELEASE TRIGGER FINGER/A-1 PULLEY LEFT MIDDLE FINGER;  Surgeon: Daryll Brod, MD;  Location: [place];  Service: Orthopedics;  Laterality: Left;  REGIONAL/FAB    reports that she has never smoked. She has never used smokeless tobacco. She reports that she does not drink alcohol or use drugs.  Social History   Socioeconomic History  . Marital status: Widowed    Spouse name: Not on file  . Number of children: Not on file  . Years of education: Not on file  . Highest education level: Not on file  Occupational  History  . Occupation: retired Sales promotion account executive  Social Needs  . Financial resource strain: Not on file  . Food insecurity:    Worry: Not on file    Inability: Not on file  . Transportation needs:    Medical: Not on file    Non-medical: Not on file  Tobacco Use  . Smoking status: Never Smoker  . Smokeless tobacco: Never Used  Substance and Sexual Activity  . Alcohol use: No  . Drug use: No  . Sexual activity: Never  Lifestyle  . Physical activity:    Days per week: Not on file    Minutes per session: Not on file  . Stress: Not on file  Relationships  . Social connections:    Talks on phone: Not on file    Gets together: Not on file    Attends religious service: Not on file    Active member of club or organization: Not on file    Attends meetings of clubs or organizations: Not on file    Relationship status: Not on file  . Intimate partner violence:    Fear of current or ex partner: Not on file    Emotionally abused: Not on file    Physically abused: Not on file    Forced sexual activity: Not on file  Other Topics Concern  . Not on file  Social History Narrative   L[facility] at [facility] since 12/06/2008, moved to [place] 03/20/15   Widowed 11/2006   Walks with cane   Never smoked   Alcohol none   Exercise -walks daily    POA/Living Will    Functional Status Survey:    Family History  Problem Relation Age of Onset  . Cancer Mother        stomach  . Cancer Father        bone  . Dementia Sister   . Cancer Brother        pancreataic  . Cancer Son        pancreatic    Health Maintenance  Topic Date Due  . INFLUENZA VACCINE  09/04/2017  . TETANUS/TDAP  06/04/2018  . DEXA SCAN  Completed  . PNA vac Low Risk Adult  Completed    Allergies  Allergen Reactions  . Avelox [Moxifloxacin Hcl In Nacl] Itching  . Doxycycline   . Hista-Tabs [Triprolidine-Pse] Other (See Comments)    Passed out  . Septra [Sulfamethoxazole-Trimethoprim]   . Sulfa Antibiotics      Itching/rash (all over)    Outpatient Encounter Medications as of 08/25/2017  Medication Sig  . acetaminophen (TYLENOL) 325 MG tablet Take 2 tablets (650 mg total) by mouth every 6 (six) hours as needed for mild pain or moderate pain.  Marland Kitchen albuterol (VENTOLIN HFA) 108 (90 Base) MCG/ACT inhaler Inhale 2 puffs by mouth every 6 hours as needed for shortness of breath  . escitalopram (LEXAPRO) 10 MG tablet Take 10 mg by mouth daily.  . feeding supplement (BOOST HIGH PROTEIN) LIQD Take 1 Container by mouth daily.   . fluticasone (FLONASE) 50 MCG/ACT nasal spray Place 2 sprays into both nostrils daily.  . Fluticasone-Salmeterol (ADVAIR) 250-50 MCG/DOSE AEPB Inhale 1 puff 2 (two) times daily into the lungs.  Rinse mouth after use  . furosemide (LASIX) Kimberly MG tablet Take Kimberly mg by mouth every morning.  Marland Kitchen ipratropium (ATROVENT) 0.02 % nebulizer solution Take 0.5 mg by nebulization every 6 (six) hours as needed for wheezing or shortness of breath.  . levothyroxine (SYNTHROID, LEVOTHROID) 25 MCG tablet Take 25 mcg by mouth daily before breakfast.  . mirtazapine (REMERON) 15 MG tablet Take 15 mg by mouth at bedtime.  . montelukast (SINGULAIR) 10 MG tablet Take one tablet by mouth once daily to help breathing  . MYRBETRIQ 25 MG TB24 tablet TAKE ONE TABLET BY MOUTH ONCE DAILY  . OXYGEN Inhale 2 L/min into the lungs as needed. Use as needed to keep O2 sats > 90%  . raloxifene (EVISTA) 60 MG tablet Take 1 tablet by mouth  daily to treat osteoporosis  . ranitidine (ZANTAC) 150 MG capsule Take 150 mg by mouth daily.  Marland Kitchen ZINC OXIDE EX Apply 1 application topically 2 (two) times daily.    No facility-administered encounter medications on file as of 08/25/2017.     Review of Systems  Constitutional: Negative for appetite change, chills and fever.  HENT: Positive for hearing loss. Negative for congestion, mouth sores and trouble swallowing.   Respiratory: Negative for shortness of breath.   Cardiovascular: Positive  for leg swelling. Negative for chest pain and palpitations.  Gastrointestinal: Negative for constipation, diarrhea, nausea and vomiting.  Genitourinary: Positive for frequency. Negative for pelvic pain.  Musculoskeletal: Positive for arthralgias and gait problem. Negative for back pain.  Skin: Negative for rash.  Neurological: Negative for dizziness and headaches.  Psychiatric/Behavioral: Positive for confusion. Negative for behavioral problems.    Vitals:   08/25/17 1024  BP: (!) 142/58  Pulse: 61  Resp: 20  Temp: 98.2 F (36.8 C)  TempSrc: Oral  SpO2: 94%  Weight: 137 lb 8 oz (62.4 kg)  Height: 4\' 11"  (1.499 m)   Body mass index is 27.77 kg/m.   Wt Readings from Last 3 Encounters:  08/25/17 137 lb 8 oz (62.4 kg)  07/17/17 135 lb 6.4 oz (61.4 kg)  05/29/17 129 lb 12.8 oz (58.9 kg)   Physical Exam  Constitutional: No distress.  Overweight elderly Greer  HENT:  Head: Normocephalic and atraumatic.  Nose: Nose normal.  Mouth/Throat: Oropharynx is clear and moist. No oropharyngeal exudate.  Eyes: Conjunctivae are normal. Right eye exhibits no discharge. Left eye exhibits no discharge.  Neck: Neck supple.  Cardiovascular: Normal rate and regular rhythm.  Pulmonary/Chest: Effort normal and breath sounds normal. She has no wheezes. She has no rales.  Abdominal: Soft. Bowel sounds are normal. There is no tenderness. There is no guarding.  Musculoskeletal: She exhibits edema.  Can move all 4 extremities, unsteady gait, uses walker, arthritis changes to finger  Lymphadenopathy:    She has no cervical adenopathy.  Neurological: She is alert.  Oriented to person and place  Skin: Skin is warm and dry. She is not diaphoretic.  Psychiatric: She has a normal mood and affect.    Labs reviewed: Basic Metabolic Panel: Recent Labs    12/05/16 01/14/17 06/02/17  NA 145 138 144  144  K 4.1 4.5 4.4  4.4  BUN 23* 23* 18  CREATININE 1.0 1.2* 1.0  0.97  CALCIUM  --   --  9.4    Liver Function Tests: Recent Labs    01/14/17 06/02/17  AST 16 12*  12  ALT 14 8  8   ALKPHOS 60 65  65  BILITOT  --  0.4  ALBUMIN  --  3.9   No results for input(s): LIPASE, AMYLASE in the last 8760 hours. No results for input(s): AMMONIA in the last 8760 hours. CBC: Recent Labs    12/05/16 01/14/17 06/02/17  WBC 7.3 9.3 8.1  8.1  NEUTROABS 4,132  --   --   HGB 12.7 12.7 14.1  14.1  HCT 39 38 42  42  MCV  --   --  87.3  PLT 272 291 287   Cardiac Enzymes: No results for input(s): CKTOTAL, CKMB, CKMBINDEX, TROPONINI in the last 8760 hours. BNP: Invalid input(s): POCBNP Lab Results  Component Value Date   HGBA1C 6.1 05/13/2016   Lab Results  Component Value Date   TSH 1.60 06/02/2017   TSH 1.60 06/02/2017   No results found for: VITAMINB12 No results found for: FOLATE No results found for: IRON, TIBC, FERRITIN  Lipid Panel: No results for input(s): CHOL, HDL, LDLCALC, TRIG, CHOLHDL, LDLDIRECT in the last 8760 hours. Lab Results  Component Value Date   HGBA1C 6.1 05/13/2016    Procedures since last visit: No results found.  Assessment/Plan  1. Intrinsic asthma Continue advair bid and albuterol prn. Continue flonase daily. Continue singulair  2. Gastroesophageal reflux disease without esophagitis Continue ranitidine 150 mg daily  3. Hypothyroidism, unspecified type Continue levothyroxine 25 mcg daily Lab Results  Component Value Date   TSH 1.60 06/02/2017   TSH 1.60 06/02/2017    4. Sensorineural hearing loss (SNHL) of both ears Supportive care  5. OAB (overactive bladder) Continue myrbetriq and monitor  6. Major depression, chronic Continue escitalopram 10 mg daily and remeron 15 mg daily.  7. PVD (peripheral vascular disease) (HCC) Continue lasix Kimberly mg daily. Monitor bmp periodically.    Labs/tests ordered:  none  Communication: reviewed care plan with patient and charge nurse.     Blanchie Serve, MD Internal Medicine [facility] [address] [place], [place] [address] Cell Phone (Monday-Friday 8 am - 5 pm): [phone] On Call: [phone] and follow prompts after 5 pm and on weekends Office Phone: [phone] Office Fax: [phone]

## 2017-09-15 ENCOUNTER — Other Ambulatory Visit: Payer: Self-pay

## 2017-09-15 ENCOUNTER — Emergency Department (HOSPITAL_COMMUNITY): Payer: Medicare Other

## 2017-09-15 ENCOUNTER — Inpatient Hospital Stay (HOSPITAL_COMMUNITY)
Admission: EM | Admit: 2017-09-15 | Discharge: 2017-09-21 | DRG: 480 | Disposition: A | Discharge status: Skilled Nursing Facility | Payer: Medicare Other | Attending: Family Medicine | Admitting: Family Medicine

## 2017-09-15 ENCOUNTER — Encounter (HOSPITAL_COMMUNITY): Payer: Self-pay | Admitting: Emergency Medicine

## 2017-09-15 DIAGNOSIS — S72001A Fracture of unspecified part of neck of right femur, initial encounter for closed fracture: Secondary | ICD-10-CM

## 2017-09-15 DIAGNOSIS — Z90710 Acquired absence of both cervix and uterus: Secondary | ICD-10-CM

## 2017-09-15 DIAGNOSIS — E039 Hypothyroidism, unspecified: Secondary | ICD-10-CM | POA: Diagnosis not present

## 2017-09-15 DIAGNOSIS — M6284 Sarcopenia: Secondary | ICD-10-CM | POA: Diagnosis not present

## 2017-09-15 DIAGNOSIS — Z7189 Other specified counseling: Secondary | ICD-10-CM | POA: Diagnosis not present

## 2017-09-15 DIAGNOSIS — S52501D Unspecified fracture of the lower end of right radius, subsequent encounter for closed fracture with routine healing: Secondary | ICD-10-CM | POA: Diagnosis not present

## 2017-09-15 DIAGNOSIS — Z880 Allergy status to penicillin: Secondary | ICD-10-CM

## 2017-09-15 DIAGNOSIS — Z881 Allergy status to other antibiotic agents status: Secondary | ICD-10-CM | POA: Diagnosis not present

## 2017-09-15 DIAGNOSIS — E785 Hyperlipidemia, unspecified: Secondary | ICD-10-CM | POA: Diagnosis not present

## 2017-09-15 DIAGNOSIS — R739 Hyperglycemia, unspecified: Secondary | ICD-10-CM | POA: Diagnosis present

## 2017-09-15 DIAGNOSIS — Z9841 Cataract extraction status, right eye: Secondary | ICD-10-CM | POA: Diagnosis not present

## 2017-09-15 DIAGNOSIS — R7303 Prediabetes: Secondary | ICD-10-CM | POA: Diagnosis present

## 2017-09-15 DIAGNOSIS — R410 Disorientation, unspecified: Secondary | ICD-10-CM | POA: Diagnosis not present

## 2017-09-15 DIAGNOSIS — I447 Left bundle-branch block, unspecified: Secondary | ICD-10-CM | POA: Diagnosis present

## 2017-09-15 DIAGNOSIS — S52602D Unspecified fracture of lower end of left ulna, subsequent encounter for closed fracture with routine healing: Secondary | ICD-10-CM | POA: Diagnosis not present

## 2017-09-15 DIAGNOSIS — J9601 Acute respiratory failure with hypoxia: Secondary | ICD-10-CM | POA: Diagnosis not present

## 2017-09-15 DIAGNOSIS — Z886 Allergy status to analgesic agent status: Secondary | ICD-10-CM | POA: Diagnosis not present

## 2017-09-15 DIAGNOSIS — I739 Peripheral vascular disease, unspecified: Secondary | ICD-10-CM | POA: Diagnosis present

## 2017-09-15 DIAGNOSIS — S72141D Displaced intertrochanteric fracture of right femur, subsequent encounter for closed fracture with routine healing: Secondary | ICD-10-CM | POA: Diagnosis not present

## 2017-09-15 DIAGNOSIS — J42 Unspecified chronic bronchitis: Secondary | ICD-10-CM | POA: Diagnosis not present

## 2017-09-15 DIAGNOSIS — Z7401 Bed confinement status: Secondary | ICD-10-CM | POA: Diagnosis not present

## 2017-09-15 DIAGNOSIS — N3281 Overactive bladder: Secondary | ICD-10-CM | POA: Diagnosis not present

## 2017-09-15 DIAGNOSIS — R296 Repeated falls: Secondary | ICD-10-CM | POA: Diagnosis not present

## 2017-09-15 DIAGNOSIS — S72141A Displaced intertrochanteric fracture of right femur, initial encounter for closed fracture: Principal | ICD-10-CM | POA: Diagnosis present

## 2017-09-15 DIAGNOSIS — W010XXA Fall on same level from slipping, tripping and stumbling without subsequent striking against object, initial encounter: Secondary | ICD-10-CM | POA: Diagnosis present

## 2017-09-15 DIAGNOSIS — D62 Acute posthemorrhagic anemia: Secondary | ICD-10-CM | POA: Diagnosis not present

## 2017-09-15 DIAGNOSIS — S52571A Other intraarticular fracture of lower end of right radius, initial encounter for closed fracture: Secondary | ICD-10-CM | POA: Diagnosis not present

## 2017-09-15 DIAGNOSIS — Z79890 Hormone replacement therapy (postmenopausal): Secondary | ICD-10-CM | POA: Diagnosis not present

## 2017-09-15 DIAGNOSIS — R413 Other amnesia: Secondary | ICD-10-CM | POA: Diagnosis present

## 2017-09-15 DIAGNOSIS — R0789 Other chest pain: Secondary | ICD-10-CM | POA: Diagnosis not present

## 2017-09-15 DIAGNOSIS — J324 Chronic pansinusitis: Secondary | ICD-10-CM | POA: Diagnosis present

## 2017-09-15 DIAGNOSIS — G8918 Other acute postprocedural pain: Secondary | ICD-10-CM | POA: Diagnosis not present

## 2017-09-15 DIAGNOSIS — J449 Chronic obstructive pulmonary disease, unspecified: Secondary | ICD-10-CM | POA: Diagnosis not present

## 2017-09-15 DIAGNOSIS — S52601A Unspecified fracture of lower end of right ulna, initial encounter for closed fracture: Secondary | ICD-10-CM | POA: Diagnosis present

## 2017-09-15 DIAGNOSIS — M25511 Pain in right shoulder: Secondary | ICD-10-CM | POA: Diagnosis not present

## 2017-09-15 DIAGNOSIS — Z7951 Long term (current) use of inhaled steroids: Secondary | ICD-10-CM | POA: Diagnosis not present

## 2017-09-15 DIAGNOSIS — J329 Chronic sinusitis, unspecified: Secondary | ICD-10-CM | POA: Diagnosis not present

## 2017-09-15 DIAGNOSIS — R1313 Dysphagia, pharyngeal phase: Secondary | ICD-10-CM | POA: Diagnosis not present

## 2017-09-15 DIAGNOSIS — Z9181 History of falling: Secondary | ICD-10-CM | POA: Diagnosis not present

## 2017-09-15 DIAGNOSIS — M1991 Primary osteoarthritis, unspecified site: Secondary | ICD-10-CM | POA: Diagnosis not present

## 2017-09-15 DIAGNOSIS — Z8781 Personal history of (healed) traumatic fracture: Secondary | ICD-10-CM | POA: Diagnosis not present

## 2017-09-15 DIAGNOSIS — S72009A Fracture of unspecified part of neck of unspecified femur, initial encounter for closed fracture: Secondary | ICD-10-CM | POA: Diagnosis not present

## 2017-09-15 DIAGNOSIS — Z9842 Cataract extraction status, left eye: Secondary | ICD-10-CM | POA: Diagnosis not present

## 2017-09-15 DIAGNOSIS — R55 Syncope and collapse: Secondary | ICD-10-CM | POA: Diagnosis present

## 2017-09-15 DIAGNOSIS — S52501A Unspecified fracture of the lower end of right radius, initial encounter for closed fracture: Secondary | ICD-10-CM | POA: Diagnosis not present

## 2017-09-15 DIAGNOSIS — J45909 Unspecified asthma, uncomplicated: Secondary | ICD-10-CM | POA: Diagnosis not present

## 2017-09-15 DIAGNOSIS — R41841 Cognitive communication deficit: Secondary | ICD-10-CM | POA: Diagnosis not present

## 2017-09-15 DIAGNOSIS — M2550 Pain in unspecified joint: Secondary | ICD-10-CM | POA: Diagnosis not present

## 2017-09-15 DIAGNOSIS — R0902 Hypoxemia: Secondary | ICD-10-CM | POA: Diagnosis not present

## 2017-09-15 DIAGNOSIS — Y92002 Bathroom of unspecified non-institutional (private) residence single-family (private) house as the place of occurrence of the external cause: Secondary | ICD-10-CM

## 2017-09-15 DIAGNOSIS — Z66 Do not resuscitate: Secondary | ICD-10-CM | POA: Diagnosis present

## 2017-09-15 DIAGNOSIS — Z79899 Other long term (current) drug therapy: Secondary | ICD-10-CM

## 2017-09-15 DIAGNOSIS — M6281 Muscle weakness (generalized): Secondary | ICD-10-CM | POA: Diagnosis not present

## 2017-09-15 DIAGNOSIS — I4891 Unspecified atrial fibrillation: Secondary | ICD-10-CM | POA: Diagnosis not present

## 2017-09-15 DIAGNOSIS — Z515 Encounter for palliative care: Secondary | ICD-10-CM

## 2017-09-15 DIAGNOSIS — N179 Acute kidney failure, unspecified: Secondary | ICD-10-CM | POA: Diagnosis present

## 2017-09-15 DIAGNOSIS — R29898 Other symptoms and signs involving the musculoskeletal system: Secondary | ICD-10-CM | POA: Diagnosis not present

## 2017-09-15 DIAGNOSIS — Z961 Presence of intraocular lens: Secondary | ICD-10-CM | POA: Diagnosis present

## 2017-09-15 DIAGNOSIS — J984 Other disorders of lung: Secondary | ICD-10-CM | POA: Diagnosis not present

## 2017-09-15 DIAGNOSIS — R2681 Unsteadiness on feet: Secondary | ICD-10-CM | POA: Diagnosis not present

## 2017-09-15 DIAGNOSIS — M818 Other osteoporosis without current pathological fracture: Secondary | ICD-10-CM | POA: Diagnosis not present

## 2017-09-15 DIAGNOSIS — I959 Hypotension, unspecified: Secondary | ICD-10-CM | POA: Diagnosis not present

## 2017-09-15 DIAGNOSIS — S42292A Other displaced fracture of upper end of left humerus, initial encounter for closed fracture: Secondary | ICD-10-CM | POA: Diagnosis not present

## 2017-09-15 DIAGNOSIS — W19XXXA Unspecified fall, initial encounter: Secondary | ICD-10-CM | POA: Diagnosis not present

## 2017-09-15 DIAGNOSIS — Z888 Allergy status to other drugs, medicaments and biological substances status: Secondary | ICD-10-CM

## 2017-09-15 DIAGNOSIS — D72829 Elevated white blood cell count, unspecified: Secondary | ICD-10-CM | POA: Diagnosis present

## 2017-09-15 DIAGNOSIS — Z9981 Dependence on supplemental oxygen: Secondary | ICD-10-CM | POA: Diagnosis not present

## 2017-09-15 DIAGNOSIS — S4991XA Unspecified injury of right shoulder and upper arm, initial encounter: Secondary | ICD-10-CM | POA: Diagnosis not present

## 2017-09-15 DIAGNOSIS — M810 Age-related osteoporosis without current pathological fracture: Secondary | ICD-10-CM | POA: Diagnosis not present

## 2017-09-15 DIAGNOSIS — R278 Other lack of coordination: Secondary | ICD-10-CM | POA: Diagnosis not present

## 2017-09-15 DIAGNOSIS — S52591A Other fractures of lower end of right radius, initial encounter for closed fracture: Secondary | ICD-10-CM | POA: Diagnosis not present

## 2017-09-15 DIAGNOSIS — S0990XA Unspecified injury of head, initial encounter: Secondary | ICD-10-CM | POA: Diagnosis not present

## 2017-09-15 DIAGNOSIS — Z882 Allergy status to sulfonamides status: Secondary | ICD-10-CM

## 2017-09-15 LAB — BASIC METABOLIC PANEL
Anion gap: 11 (ref 5–15)
BUN: 18 mg/dL (ref 8–23)
CHLORIDE: 105 mmol/L (ref 98–111)
CO2: 23 mmol/L (ref 22–32)
CREATININE: 1.09 mg/dL — AB (ref 0.44–1.00)
Calcium: 8.7 mg/dL — ABNORMAL LOW (ref 8.9–10.3)
GFR calc non Af Amer: 42 mL/min — ABNORMAL LOW (ref >60)
GFR, EST AFRICAN AMERICAN: 49 mL/min — AB (ref >60)
GLUCOSE: 157 mg/dL — AB (ref 70–99)
Potassium: 4.7 mmol/L (ref 3.5–5.1)
Sodium: 139 mmol/L (ref 135–145)

## 2017-09-15 LAB — CBC WITH DIFFERENTIAL/PLATELET
Abs Immature Granulocytes: 0.1 10*3/uL (ref 0.0–0.1)
Basophils Absolute: 0.1 10*3/uL (ref 0.0–0.1)
Basophils Relative: 0 %
EOS ABS: 0.2 10*3/uL (ref 0.0–0.7)
Eosinophils Relative: 1 %
HEMATOCRIT: 40.3 % (ref 36.0–46.0)
Hemoglobin: 12.7 g/dL (ref 12.0–15.0)
IMMATURE GRANULOCYTES: 1 %
LYMPHS ABS: 1.2 10*3/uL (ref 0.7–4.0)
Lymphocytes Relative: 7 %
MCH: 29.4 pg (ref 26.0–34.0)
MCHC: 31.5 g/dL (ref 30.0–36.0)
MCV: 93.3 fL (ref 78.0–100.0)
MONO ABS: 1 10*3/uL (ref 0.1–1.0)
MONOS PCT: 6 %
NEUTROS PCT: 85 %
Neutro Abs: 14 10*3/uL — ABNORMAL HIGH (ref 1.7–7.7)
Platelets: 252 10*3/uL (ref 150–400)
RBC: 4.32 MIL/uL (ref 3.87–5.11)
RDW: 14.2 % (ref 11.5–15.5)
WBC: 16.6 10*3/uL — ABNORMAL HIGH (ref 4.0–10.5)

## 2017-09-15 LAB — PROTIME-INR
INR: 1.03
Prothrombin Time: 13.4 seconds (ref 11.4–15.2)

## 2017-09-15 LAB — TYPE AND SCREEN
ABO/RH(D): A POS
ANTIBODY SCREEN: NEGATIVE

## 2017-09-15 LAB — ABO/RH: ABO/RH(D): A POS

## 2017-09-15 MED ORDER — MOMETASONE FURO-FORMOTEROL FUM 200-5 MCG/ACT IN AERO
2.0000 | INHALATION_SPRAY | Freq: Two times a day (BID) | RESPIRATORY_TRACT | Status: DC
  Administered 2017-09-16 – 2017-09-21 (×8): 2 via RESPIRATORY_TRACT
  Filled 2017-09-15 (×3): qty 8.8

## 2017-09-15 MED ORDER — MORPHINE SULFATE (PF) 2 MG/ML IV SOLN
0.5000 mg | INTRAVENOUS | Status: DC | PRN
  Administered 2017-09-17: 0.5 mg via INTRAVENOUS
  Filled 2017-09-15: qty 1

## 2017-09-15 MED ORDER — MIRTAZAPINE 15 MG PO TABS
15.0000 mg | ORAL_TABLET | Freq: Every evening | ORAL | Status: DC
  Administered 2017-09-15 – 2017-09-20 (×5): 15 mg via ORAL
  Filled 2017-09-15 (×5): qty 1

## 2017-09-15 MED ORDER — BOOST PLUS PO LIQD
100.0000 mL | Freq: Every day | ORAL | Status: DC
Start: 2017-09-16 — End: 2017-09-21
  Administered 2017-09-19 – 2017-09-21 (×3): 237 mL via ORAL
  Filled 2017-09-15 (×7): qty 237

## 2017-09-15 MED ORDER — MIRABEGRON ER 25 MG PO TB24
25.0000 mg | ORAL_TABLET | Freq: Every evening | ORAL | Status: DC
  Administered 2017-09-15 – 2017-09-20 (×5): 25 mg via ORAL
  Filled 2017-09-15 (×5): qty 1

## 2017-09-15 MED ORDER — LEVOTHYROXINE SODIUM 25 MCG PO TABS
25.0000 ug | ORAL_TABLET | Freq: Every day | ORAL | Status: DC
  Administered 2017-09-16 – 2017-09-21 (×6): 25 ug via ORAL
  Filled 2017-09-15 (×6): qty 1

## 2017-09-15 MED ORDER — FUROSEMIDE 40 MG PO TABS: 40.0000 mg | ORAL_TABLET | Freq: Every day | ORAL | Status: DC

## 2017-09-15 MED ORDER — ESCITALOPRAM OXALATE 10 MG PO TABS
10.0000 mg | ORAL_TABLET | Freq: Every day | ORAL | Status: DC
  Administered 2017-09-16 – 2017-09-21 (×6): 10 mg via ORAL
  Filled 2017-09-15 (×6): qty 1

## 2017-09-15 MED ORDER — HYDROCODONE-ACETAMINOPHEN 5-325 MG PO TABS
1.0000 | ORAL_TABLET | Freq: Four times a day (QID) | ORAL | Status: DC | PRN
  Administered 2017-09-15: 1 via ORAL
  Filled 2017-09-15: qty 1

## 2017-09-15 MED ORDER — IPRATROPIUM BROMIDE 0.02 % IN SOLN: 0.5000 mg | Freq: Four times a day (QID) | RESPIRATORY_TRACT | Status: DC | PRN

## 2017-09-15 MED ORDER — FENTANYL CITRATE (PF) 100 MCG/2ML IJ SOLN
25.0000 ug | INTRAMUSCULAR | Status: DC | PRN
  Administered 2017-09-15: 25 ug via INTRAVENOUS
  Filled 2017-09-15: qty 2

## 2017-09-15 MED ORDER — MONTELUKAST SODIUM 10 MG PO TABS
10.0000 mg | ORAL_TABLET | Freq: Every evening | ORAL | Status: DC
  Administered 2017-09-15 – 2017-09-20 (×5): 10 mg via ORAL
  Filled 2017-09-15 (×5): qty 1

## 2017-09-15 MED ORDER — FLUTICASONE PROPIONATE 50 MCG/ACT NA SUSP
2.0000 | Freq: Every day | NASAL | Status: DC
  Administered 2017-09-16 – 2017-09-21 (×6): 2 via NASAL
  Filled 2017-09-15: qty 16

## 2017-09-15 MED ORDER — SODIUM CHLORIDE 0.9 % IV SOLN
INTRAVENOUS | Status: DC
Start: 2017-09-15 — End: 2017-09-21
  Administered 2017-09-16 – 2017-09-17 (×2): via INTRAVENOUS

## 2017-09-15 MED ORDER — FAMOTIDINE 20 MG PO TABS
20.0000 mg | ORAL_TABLET | Freq: Two times a day (BID) | ORAL | Status: DC
  Administered 2017-09-15 – 2017-09-16 (×2): 20 mg via ORAL
  Filled 2017-09-15 (×2): qty 1

## 2017-09-15 MED ORDER — ALBUTEROL SULFATE HFA 108 (90 BASE) MCG/ACT IN AERS: 2.0000 | INHALATION_SPRAY | Freq: Four times a day (QID) | RESPIRATORY_TRACT | Status: DC | PRN

## 2017-09-15 MED ORDER — ALBUTEROL SULFATE (2.5 MG/3ML) 0.083% IN NEBU
2.5000 mg | INHALATION_SOLUTION | Freq: Four times a day (QID) | RESPIRATORY_TRACT | Status: DC | PRN
Start: 2017-09-15 — End: 2017-09-21

## 2017-09-15 MED ORDER — ONDANSETRON HCL 4 MG/2ML IJ SOLN
4.0000 mg | Freq: Once | INTRAMUSCULAR | Status: AC
  Administered 2017-09-15: 4 mg via INTRAVENOUS
  Filled 2017-09-15: qty 2

## 2017-09-15 NOTE — ED Triage Notes (Signed)
Per EMS, pt coming from [facility] assisted living with complaints of an unwitnessed fall. Pt stated that she rembered going to the bathroom and fell but could not get up. Staff is unsure how long pt was on floor. Pt complains of right sided hip pain and right sided shoulder pain. Per staff pt is currently at her baseline, pt does have hx of dementia.

## 2017-09-15 NOTE — H&P (Signed)
History and Physical    Kimberly Greer TOI:[id number] DOB: 1921/03/06 DOA: 09/15/2017  PCP: Blanchie Serve, MD  Patient coming from: [place].  Chief Complaint: Fall.  HPI: Kimberly Greer is a 84 y.o. female with history of COPD/asthma, hypothyroidism presents to the ER after patient had a fall.  Patient states she was walking to the bathroom when she slipped and fell.  Denies hitting head or losing consciousness.  She fell onto her right arm.  Since the fall patient has benign pain in the right hip and right wrist.  ED Course: In the ER x-rays revealed right hip fracture and right wrist fracture.  On-call orthopedic surgeon Dr. Alvan Dame was consulted.  Patient at this time is refusing surgery.  Admitted for further pain management.  Orthopedics to see patient in consult.  Review of Systems: As per HPI, rest all negative.   Past Medical History:  Diagnosis Date  . Asthma   . Carpal tunnel syndrome 03/07/2009  . Disturbance of skin sensation 02/21/2009  . Diverticulosis of colon (without mention of hemorrhage) 02/16/2009  . Fracture of upper end of left humerus 11/18/14  . 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 Hypothyroid   . Osteoarthrosis, unspecified whether generalized or localized, unspecified site 02/16/2009  . Other abnormal blood chemistry 06/19/2010   hyperglycemia  . Other and unspecified hyperlipidemia 10/24/2009  . Other atopic dermatitis and related conditions 02/16/2009  . Otosclerosis, unspecified 02/22/2003  . Pain in limb 12/05/2009  . Senile osteoporosis 06/16/2010  . Sensorineural hearing loss, unspecified 02/21/2009  . Syncope and collapse 05/30/2009  . Unspecified nasal polyp 02/21/2009  . Unspecified urinary incontinence 02/21/2009    Past Surgical History:  Procedure Laterality Date  . ABDOMINAL HYSTERECTOMY  1999   Dr. Collier Bullock  . BREAST BIOPSY  07/16/1990   benign left breast needle biopsy  Dr. Margot Chimes  . CARPAL TUNNEL RELEASE  04/07/2009   right Dr. Daylene Katayama  . CATARACT EXTRACTION W/ INTRAOCULAR LENS  IMPLANT, BILATERAL Bilateral   . TRIGGER FINGER RELEASE Left 08/09/2014   Procedure: RELEASE TRIGGER FINGER/A-1 PULLEY LEFT MIDDLE FINGER;  Surgeon: Daryll Brod, MD;  Location: [facility];  Service: Orthopedics;  Laterality: Left;  REGIONAL/FAB     reports that she has never smoked. She has never used smokeless tobacco. She reports that she does not drink alcohol or use drugs.  Allergies  Allergen Reactions  . Sulfa Antibiotics Itching and Rash  . Amoxicillin Other (See Comments)    Per [facility] 09/15/17 Has patient had a PCN reaction causing immediate rash, facial/tongue/throat swelling, SOB or lightheadedness with hypotension: Unknown Has patient had a PCN reaction causing severe rash involving mucus membranes or skin necrosis: Unknown Has patient had a PCN reaction that required hospitalization: Unknown Has patient had a PCN reaction occurring within the last 10 years: Unknown If all of the above answers are "NO", then may proceed with Cephalosporin use.  . Avelox [Moxifloxacin Hcl In Nacl] Itching  . Doxycycline Other (See Comments)    Unknown reaction  . Hista-Tabs [Triprolidine-Pse] Other (See Comments)    Passed out  . Pyrilamine-Phenylephrine Other (See Comments)    Unknown reaction - per [facility] 09/15/17  . Septra [Sulfamethoxazole-Trimethoprim] Other (See Comments)    Unknown reaction    Family History  Problem Relation Age of Onset  . Cancer Mother        stomach  . Cancer Father        bone  . Dementia Sister   .  Cancer Brother        pancreataic  . Cancer Son        pancreatic    Prior to Admission medications   Medication Sig Start Date End Date Taking? Authorizing Provider  acetaminophen (TYLENOL) 325 MG tablet Take 2 tablets (650 mg total) by mouth every 6 (six) hours as needed for mild pain or moderate pain. Patient taking differently: Take 650 mg by mouth every 6 (six) hours as needed (pain).   11/06/14  Yes Forde Dandy, MD  albuterol (VENTOLIN HFA) 108 (90 Base) MCG/ACT inhaler Inhale 2 puffs into the lungs every 6 (six) hours as needed for shortness of breath (dyspnea).    Yes [provider]  escitalopram (LEXAPRO) 10 MG tablet Take 10 mg by mouth daily. 11/30/16  Yes [provider]  feeding supplement (BOOST HIGH PROTEIN) LIQD Take 100-237 mLs by mouth daily.    Yes [provider]  fluticasone (FLONASE) 50 MCG/ACT nasal spray Place 2 sprays into both nostrils daily.   Yes [provider]  Fluticasone-Salmeterol (ADVAIR) 250-50 MCG/DOSE AEPB Inhale 1 puff 2 (two) times daily into the lungs. Rinse mouth after use   Yes [provider]  furosemide (LASIX) 40 MG tablet Take 40 mg by mouth daily.    Yes [provider]  ipratropium (ATROVENT) 0.02 % nebulizer solution Take 0.5 mg by nebulization every 6 (six) hours as needed for wheezing or shortness of breath.   Yes [provider]  levothyroxine (SYNTHROID, LEVOTHROID) 25 MCG tablet Take 25 mcg by mouth daily before breakfast.   Yes [provider]  mirtazapine (REMERON) 15 MG tablet Take 15 mg by mouth every evening.    Yes [provider]  montelukast (SINGULAIR) 10 MG tablet Take one tablet by mouth once daily to help breathing Patient taking differently: Take 10 mg by mouth every evening. to help breathing 06/07/14  Yes [name], MD  MYRBETRIQ 25 MG TB24 tablet TAKE ONE TABLET BY MOUTH ONCE DAILY Patient taking differently: Take 25 mg by mouth every evening.  03/30/14  Yes [name], MD  OXYGEN Inhale 2 L/min into the lungs as needed (to keep O2 SATS >90%).    Yes [provider]  raloxifene (EVISTA) 60 MG tablet Take 1 tablet by mouth  daily to treat osteoporosis Patient taking differently: Take 60 mg by mouth every evening.  09/23/14  Yes [name], MD  ranitidine (ZANTAC) 150 MG tablet Take 150 mg by mouth daily.   Yes  [provider]  zinc oxide 20 % ointment Apply 1 application topically See admin instructions. Start date 09/08/17: apply topically to buttocks every shift for 10 days  and as needed after incontinent episodes.   Yes [provider]    Physical Exam: Vitals:   09/15/17 2100 09/15/17 2115 09/15/17 2130 09/15/17 2145  BP: 121/60 (!) 112/48 (!) 122/101 (!) 122/48  Pulse: 78 (!) 58 (!) 46 67  Resp: 19 14 19 16   Temp:      TempSrc:      SpO2: 98% 96% 97% 96%  Weight:      Height:          Constitutional: Moderately built and nourished. Vitals:   09/15/17 2100 09/15/17 2115 09/15/17 2130 09/15/17 2145  BP: 121/60 (!) 112/48 (!) 122/101 (!) 122/48  Pulse: 78 (!) 58 (!) 46 67  Resp: 19 14 19 16   Temp:      TempSrc:  SpO2: 98% 96% 97% 96%  Weight:      Height:       Eyes: Anicteric no pallor. ENMT: No discharge from the ears eyes nose or mouth. Neck: No mass felt.  No neck rigidity but no JVD appreciated. Respiratory: No rhonchi or crepitations. Cardiovascular: S1-S2 heard no murmurs appreciated. Abdomen: Soft nontender bowel sounds present. Musculoskeletal: Right wrist pain and right hip pain on moving. Skin: No rash. Neurologic: Alert awake oriented to time place and person.  Moves all extremities. Psychiatric: Appears normal per normal affect.   Labs on Admission: I have personally reviewed following labs and imaging studies  CBC: Recent Labs  Lab 09/15/17 1907  WBC 16.6*  NEUTROABS 14.0*  HGB 12.7  HCT 40.3  MCV 93.3  PLT 716   Basic Metabolic Panel: Recent Labs  Lab 09/15/17 1907  NA 139  K 4.7  CL 105  CO2 23  GLUCOSE 157*  BUN 18  CREATININE 1.09*  CALCIUM 8.7*   GFR: Estimated Creatinine Clearance: 25.9 mL/min (A) (by C-G formula based on SCr of 1.09 mg/dL (H)). Liver Function Tests: No results for input(s): AST, ALT, ALKPHOS, BILITOT, PROT, ALBUMIN in the last 168 hours. No results for input(s): LIPASE, AMYLASE in the last  168 hours. No results for input(s): AMMONIA in the last 168 hours. Coagulation Profile: Recent Labs  Lab 09/15/17 1907  INR 1.03   Cardiac Enzymes: No results for input(s): CKTOTAL, CKMB, CKMBINDEX, TROPONINI in the last 168 hours. BNP (last 3 results) No results for input(s): PROBNP in the last 8760 hours. HbA1C: No results for input(s): HGBA1C in the last 72 hours. CBG: No results for input(s): GLUCAP in the last 168 hours. Lipid Profile: No results for input(s): CHOL, HDL, LDLCALC, TRIG, CHOLHDL, LDLDIRECT in the last 72 hours. Thyroid Function Tests: No results for input(s): TSH, T4TOTAL, FREET4, T3FREE, THYROIDAB in the last 72 hours. Anemia Panel: No results for input(s): VITAMINB12, FOLATE, FERRITIN, TIBC, IRON, RETICCTPCT in the last 72 hours. Urine analysis:    Component Value Date/Time   COLORURINE YELLOW 04/25/2011 1503   APPEARANCEUR CLEAR 04/25/2011 1503   LABSPEC 1.012 04/25/2011 1503   PHURINE 7.0 04/25/2011 1503   GLUCOSEU NEGATIVE 04/25/2011 1503   HGBUR NEGATIVE 04/25/2011 1503   BILIRUBINUR NEGATIVE 04/25/2011 1503   KETONESUR NEGATIVE 04/25/2011 1503   PROTEINUR 7.1 06/02/2017   PROTEINUR NEGATIVE 04/25/2011 1503   UROBILINOGEN 0.2 04/25/2011 1503   NITRITE NEGATIVE 04/25/2011 1503   LEUKOCYTESUR TRACE (A) 04/25/2011 1503   Sepsis Labs: @LABRCNTIP (procalcitonin:4,lacticidven:4) )No results found for this or any previous visit (from the past 240 hour(s)).   Radiological Exams on Admission: Dg Chest 1 View  Result Date: 09/15/2017 CLINICAL DATA:  Right shoulder pain. EXAM: CHEST  1 VIEW COMPARISON:  04/05/2009 FINDINGS: Hyperexpansion is consistent with emphysema. Cardiopericardial silhouette is at upper limits of normal for size. Bones are diffusely demineralized. Telemetry leads overlie the chest. IMPRESSION: Chronic changes without acute findings. Electronically Signed   By: Misty Stanley M.D.   On: 09/15/2017 19:14   Dg Shoulder Right  Result  Date: 09/15/2017 CLINICAL DATA:  Fall, right shoulder pain EXAM: RIGHT SHOULDER - 2+ VIEW COMPARISON:  None. FINDINGS: Degenerative changes in the [facility] joint with joint space narrowing and spurring. Glenohumeral joint is maintained. No acute bony abnormality. Specifically, no fracture, subluxation, or dislocation. Soft tissues are intact. IMPRESSION: No acute bony abnormality. Electronically Signed   By: Rolm Baptise M.D.   On: 09/15/2017 19:14   Dg  Wrist Complete Right  Result Date: 09/15/2017 CLINICAL DATA:  Fall with wrist deformity EXAM: RIGHT WRIST - COMPLETE 3+ VIEW COMPARISON:  None. FINDINGS: There is a comminuted, predominantly transverse fracture of the distal right radius with moderate lateral displacement. There is also a minimally displaced fracture of the distal left ulna, involving the ulnar styloid. There is severe first carpometacarpal joint osteoarthrosis. No carpal fracture is identified. IMPRESSION: 1. Comminuted fracture of the distal right radius with lateral displacement. 2. Nondisplaced fracture of the distal left ulna. 3. Severe first CMC joint osteoarthrosis. Electronically Signed   By: Ulyses Jarred M.D.   On: 09/15/2017 20:31   Ct Head Wo Contrast  Result Date: 09/15/2017 CLINICAL DATA:  84 year old who fell in her bathroom earlier today and was unable to get up on her own. It is unclear how long the patient was on the floor. Initial encounter. EXAM: CT HEAD WITHOUT CONTRAST TECHNIQUE: Contiguous axial images were obtained from the base of the skull through the vertex without intravenous contrast. COMPARISON:  11/06/2014, 04/25/2011, 09/11/2006. FINDINGS: Brain: Significant head tilt in the gantry accounts for apparent asymmetry in the cerebral hemispheres. Moderate to severe age appropriate cortical, deep and cerebellar atrophy, unchanged. Severe changes of small vessel disease of the white matter diffusely, unchanged. No mass lesion. No midline shift. No acute hemorrhage or  hematoma. No extra-axial fluid collections. No evidence of acute infarction. Vascular: Moderate BILATERAL carotid siphon atherosclerosis. No hyperdense vessel. Skull: No skull fracture or other focal osseous abnormality involving the skull. Sinuses/Orbits: Near complete opacification of the RIGHT maxillary sinus with hyperdense material in the sinus. Mucosal thickening involving the LEFT maxillary sinus and the LEFT sphenoid sinus. Opacification of multiple BILATERAL ethmoid air cells. RIGHT sphenoid sinus in both frontal sinuses well aerated. BILATERAL mastoid air cells and BILATERAL middle ear cavities well-aerated. Visualized orbits and globes normal in appearance. Other: None. IMPRESSION: 1. No acute intracranial abnormality. 2. Moderate to severe age-appropriate generalized atrophy and severe chronic microvascular ischemic changes of the white matter. 3. Chronic pansinusitis. Query superimposed chronic fungal infection in the RIGHT maxillary sinus. Electronically Signed   By: Evangeline Dakin M.D.   On: 09/15/2017 20:12   Dg Hip Unilat With Pelvis 2-3 Views Right  Result Date: 09/15/2017 CLINICAL DATA:  Fall, right hip pain EXAM: DG HIP (WITH OR WITHOUT PELVIS) 2-3V RIGHT COMPARISON:  None. FINDINGS: Right femoral intertrochanteric fracture noted with varus angulation. Lesser trochanter is displaced. No subluxation or dislocation. SI joints are symmetric and unremarkable. IMPRESSION: Right femoral intertrochanteric fracture with varus angulation and displacement of fracture fragments. Electronically Signed   By: Rolm Baptise M.D.   On: 09/15/2017 19:14    EKG: Independently reviewed.  Normal sinus rhythm with RBBB.  Assessment/Plan Active Problems:   Hypothyroid   Memory loss   PVD (peripheral vascular disease) (HCC)   Hip fracture (HCC)   Closed right hip fracture, initial encounter ([place])   COPD (chronic obstructive pulmonary disease) ([place])    1. Right hip fracture and right wrist fracture  status post mechanical fall -patient refusing surgery.  Dr. Nevada Crane and orthopedic surgeon to see consult.  Will keep patient on pain relief medications for now.  2. History of asthma presently on inhalers which should be continued not wheezing. 3. Hypothyroidism on Synthroid. 4. Acute renal failure will hold Lasix and gently hydrate. 5. LBBB -denies any chest pain. 6. Hyperglycemia -check hemoglobin A1c.   DVT prophylaxis: SCDs for now and if no surgery anticipated may change to  Lovenox. Code Status: DNR. Family Communication: No family at the bedside. Disposition Plan: Skilled nursing facility. Consults called: Orthopedics. Admission status: Inpatient.   Rise Patience MD [facility] Pager [phone].  If 7PM-7AM, please contact night-coverage [email or web address] Password [facility]  09/15/2017, 10:13 PM

## 2017-09-15 NOTE — ED Notes (Signed)
2 IV attempts without success IV team consulted

## 2017-09-15 NOTE — ED Notes (Signed)
Patient transported to CT 

## 2017-09-15 NOTE — ED Provider Notes (Signed)
[facility] EMERGENCY DEPARTMENT Provider Note   CSN: [id number] Arrival date & time: 09/15/17  1707     History   Chief Complaint Chief Complaint  Patient presents with  . Fall    HPI DYONNA JASPERS is a 84 y.o. female.  Patient is a 84 year old female with a history of syncope, hypothyroidism, asthma who is presenting today after a fall in her assisted living apartment.  Patient states she fell when she was coming out of the bathroom but cannot recall what caused her to fall.  She does remember falling.  She does not think she lost consciousness.  Staff found her on the floor and used a lift to get her back to bed but she was still complaining of pain so they called 911.  Patient is complaining of pain over her right shoulder, chest and hip.  She does have a most form that states no IV fluids or IV placed.  Asking the patient she is okay getting x-rays and asked her if she would want an IV for pain medication and she stated that she would want an IV to receive pain medication.  The history is provided by the patient and the EMS personnel.  Fall  This is a recurrent problem. The current episode started 1 to 2 hours ago. The problem occurs constantly. The problem has not changed since onset.Associated symptoms comments: Pain in the right chest, right shoulder and right hip.  Unable to walk since fall.  No LOC or neck pain.  Pt not sure if she hit her head.  She denies anticoagulation.. The symptoms are aggravated by bending and twisting. Nothing relieves the symptoms. The treatment provided no relief.    Past Medical History:  Diagnosis Date  . Asthma   . Carpal tunnel syndrome 03/07/2009  . Disturbance of skin sensation 02/21/2009  . Diverticulosis of colon (without mention of hemorrhage) 02/16/2009  . Fracture of upper end of left humerus 11/18/14  . 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 Hypothyroid   . Osteoarthrosis, unspecified whether generalized or  localized, unspecified site 02/16/2009  . Other abnormal blood chemistry 06/19/2010   hyperglycemia  . Other and unspecified hyperlipidemia 10/24/2009  . Other atopic dermatitis and related conditions 02/16/2009  . Otosclerosis, unspecified 02/22/2003  . Pain in limb 12/05/2009  . Senile osteoporosis 06/16/2010  . Sensorineural hearing loss, unspecified 02/21/2009  . Syncope and collapse 05/30/2009  . Unspecified nasal polyp 02/21/2009  . Unspecified urinary incontinence 02/21/2009    Patient Active Problem List   Diagnosis Date Noted  . PVD (peripheral vascular disease) ([place]) 08/25/2017  . Depression, recurrent ([place]) 05/29/2017  . Sinus bradycardia 08/26/2016  . Major depression, chronic 08/26/2016  . Adult failure to thrive 05/10/2016  . Dysuria 03/11/2016  . Bilateral knee pain 04/25/2015  . Situational depression 01/06/2015  . Edema 01/03/2015  . GERD (gastroesophageal reflux disease) 11/25/2014  . Osteoporosis 11/25/2014  . Abnormality of gait 03/08/2014  . Trigger finger, acquired 03/08/2014  . Dyspnea 02/09/2013  . Intrinsic asthma 02/09/2013  . Memory loss 06/23/2012  . Hypothyroid   . Hyperglycemia 06/19/2010  . Hyperlipemia 10/24/2009  . Sensorineural hearing loss 02/21/2009  . Urinary incontinence 02/21/2009  . OAB (overactive bladder) 02/16/2009  . Osteoarthritis 02/16/2009    Past Surgical History:  Procedure Laterality Date  . ABDOMINAL HYSTERECTOMY  1999   Dr. Collier Bullock  . BREAST BIOPSY  07/16/1990   benign left breast needle biopsy  Dr. Margot Chimes  . CARPAL  TUNNEL RELEASE  04/07/2009   right Dr. Daylene Katayama  . CATARACT EXTRACTION W/ INTRAOCULAR LENS  IMPLANT, BILATERAL Bilateral   . TRIGGER FINGER RELEASE Left 08/09/2014   Procedure: RELEASE TRIGGER FINGER/A-1 PULLEY LEFT MIDDLE FINGER;  Surgeon: Daryll Brod, MD;  Location: [facility];  Service: Orthopedics;  Laterality: Left;  REGIONAL/FAB     OB History   None      Home Medications    Prior to  Admission medications   Medication Sig Start Date End Date Taking? Authorizing Provider  acetaminophen (TYLENOL) 325 MG tablet Take 2 tablets (650 mg total) by mouth every 6 (six) hours as needed for mild pain or moderate pain. 11/06/14   Forde Dandy, MD  albuterol (VENTOLIN HFA) 108 (90 Base) MCG/ACT inhaler Inhale 2 puffs by mouth every 6 hours as needed for shortness of breath    [provider]  escitalopram (LEXAPRO) 10 MG tablet Take 10 mg by mouth daily. 11/30/16   [provider]  feeding supplement (BOOST HIGH PROTEIN) LIQD Take 1 Container by mouth daily.     [provider]  fluticasone (FLONASE) 50 MCG/ACT nasal spray Place 2 sprays into both nostrils daily.    [provider]  Fluticasone-Salmeterol (ADVAIR) 250-50 MCG/DOSE AEPB Inhale 1 puff 2 (two) times daily into the lungs. Rinse mouth after use    [provider]  furosemide (LASIX) 40 MG tablet Take 40 mg by mouth every morning.    [provider]  ipratropium (ATROVENT) 0.02 % nebulizer solution Take 0.5 mg by nebulization every 6 (six) hours as needed for wheezing or shortness of breath.    [provider]  levothyroxine (SYNTHROID, LEVOTHROID) 25 MCG tablet Take 25 mcg by mouth daily before breakfast.    [provider]  mirtazapine (REMERON) 15 MG tablet Take 15 mg by mouth at bedtime.    [provider]  montelukast (SINGULAIR) 10 MG tablet Take one tablet by mouth once daily to help breathing 06/07/14   [name], MD  MYRBETRIQ 25 MG TB24 tablet TAKE ONE TABLET BY MOUTH ONCE DAILY 03/30/14   [name], MD  OXYGEN Inhale 2 L/min into the lungs as needed. Use as needed to keep O2 sats > 90%    [provider]  raloxifene (EVISTA) 60 MG tablet Take 1 tablet by mouth  daily to treat osteoporosis 09/23/14   [name], MD  ranitidine (ZANTAC) 150 MG capsule Take 150 mg by mouth daily.    [provider]  ZINC OXIDE EX  Apply 1 application topically 2 (two) times daily.     [provider]    Family History Family History  Problem Relation Age of Onset  . Cancer Mother        stomach  . Cancer Father        bone  . Dementia Sister   . Cancer Brother        pancreataic  . Cancer Son        pancreatic    Social History Social History   Tobacco Use  . Smoking status: Never Smoker  . Smokeless tobacco: Never Used  Substance Use Topics  . Alcohol use: No  . Drug use: No     Allergies   Avelox [moxifloxacin hcl in nacl]; Doxycycline; Hista-tabs [triprolidine-pse]; Septra [sulfamethoxazole-trimethoprim]; and Sulfa antibiotics   Review of Systems Review of Systems  All other systems reviewed and are negative.    Physical Exam  Updated Vital Signs Temp 98.3 F (36.8 C) (Oral)   Ht 4\' 11"  (1.499 m)   Wt 65 kg   SpO2 99%   BMI 28.94 kg/m   Physical Exam  Constitutional: She is oriented to person, place, and time. She appears well-developed and well-nourished. No distress.  HENT:  Head: Normocephalic and atraumatic.  Mouth/Throat: Oropharynx is clear and moist.  Eyes: Pupils are equal, round, and reactive to light. Conjunctivae and EOM are normal.  Neck: Normal range of motion. Neck supple. No spinous process tenderness present. Normal range of motion present.  Cardiovascular: Normal rate, regular rhythm and intact distal pulses.  No murmur heard. Pulmonary/Chest: Effort normal and breath sounds normal. No respiratory distress. She has no wheezes. She has no rales. She exhibits tenderness.    Abdominal: Soft. She exhibits no distension. There is no tenderness. There is no rebound and no guarding.  Musculoskeletal: She exhibits tenderness. She exhibits no edema.       Right shoulder: She exhibits tenderness and bony tenderness. She exhibits normal range of motion.       Right hip: She exhibits decreased range of motion, tenderness, bony tenderness and deformity.  Pulses  intact in right foot  Neurological: She is alert and oriented to person, place, and time.  Skin: Skin is warm and dry. No rash noted. No erythema.  Psychiatric: She has a normal mood and affect. Her behavior is normal.  Nursing note and vitals reviewed.    ED Treatments / Results  Labs (all labs ordered are listed, but only abnormal results are displayed) Labs Reviewed  BASIC METABOLIC PANEL - Abnormal; Notable for the following components:      Result Value   Glucose, Bld 157 (*)    Creatinine, Ser 1.09 (*)    Calcium 8.7 (*)    GFR calc non Af Amer 42 (*)    GFR calc Af Amer 49 (*)    All other components within normal limits  CBC WITH DIFFERENTIAL/PLATELET - Abnormal; Notable for the following components:   WBC 16.6 (*)    Neutro Abs 14.0 (*)    All other components within normal limits  PROTIME-INR  TYPE AND SCREEN  ABO/RH    EKG EKG Interpretation  Date/Time:  Monday September 15 2017 17:19:28 EDT Ventricular Rate:  73 PR Interval:    QRS Duration: 124 QT Interval:  414 QTC Calculation: 457 R Axis:   -48 Text Interpretation:  Sinus rhythm Left bundle branch block No significant change since last tracing Confirmed by Blanchie Dessert ([id number]) on 09/15/2017 5:30:52 PM   Radiology Dg Chest 1 View  Result Date: 09/15/2017 CLINICAL DATA:  Right shoulder pain. EXAM: CHEST  1 VIEW COMPARISON:  04/05/2009 FINDINGS: Hyperexpansion is consistent with emphysema. Cardiopericardial silhouette is at upper limits of normal for size. Bones are diffusely demineralized. Telemetry leads overlie the chest. IMPRESSION: Chronic changes without acute findings. Electronically Signed   By: Misty Stanley M.D.   On: 09/15/2017 19:14   Dg Shoulder Right  Result Date: 09/15/2017 CLINICAL DATA:  Fall, right shoulder pain EXAM: RIGHT SHOULDER - 2+ VIEW COMPARISON:  None. FINDINGS: Degenerative changes in the [facility] joint with joint space narrowing and spurring. Glenohumeral joint is maintained. No  acute bony abnormality. Specifically, no fracture, subluxation, or dislocation. Soft tissues are intact. IMPRESSION: No acute bony abnormality. Electronically Signed   By: Rolm Baptise M.D.   On: 09/15/2017 19:14   Dg Wrist Complete Right  Result Date: 09/15/2017 CLINICAL DATA:  Fall with wrist deformity EXAM: RIGHT WRIST - COMPLETE 3+ VIEW COMPARISON:  None. FINDINGS: There is a comminuted, predominantly transverse fracture of the distal right radius with moderate lateral displacement. There is also a minimally displaced fracture of the distal left ulna, involving the ulnar styloid. There is severe first carpometacarpal joint osteoarthrosis. No carpal fracture is identified. IMPRESSION: 1. Comminuted fracture of the distal right radius with lateral displacement. 2. Nondisplaced fracture of the distal left ulna. 3. Severe first CMC joint osteoarthrosis. Electronically Signed   By: Ulyses Jarred M.D.   On: 09/15/2017 20:31   Ct Head Wo Contrast  Result Date: 09/15/2017 CLINICAL DATA:  84 year old who fell in her bathroom earlier today and was unable to get up on her own. It is unclear how long the patient was on the floor. Initial encounter. EXAM: CT HEAD WITHOUT CONTRAST TECHNIQUE: Contiguous axial images were obtained from the base of the skull through the vertex without intravenous contrast. COMPARISON:  11/06/2014, 04/25/2011, 09/11/2006. FINDINGS: Brain: Significant head tilt in the gantry accounts for apparent asymmetry in the cerebral hemispheres. Moderate to severe age appropriate cortical, deep and cerebellar atrophy, unchanged. Severe changes of small vessel disease of the white matter diffusely, unchanged. No mass lesion. No midline shift. No acute hemorrhage or hematoma. No extra-axial fluid collections. No evidence of acute infarction. Vascular: Moderate BILATERAL carotid siphon atherosclerosis. No hyperdense vessel. Skull: No skull fracture or other focal osseous abnormality involving the skull.  Sinuses/Orbits: Near complete opacification of the RIGHT maxillary sinus with hyperdense material in the sinus. Mucosal thickening involving the LEFT maxillary sinus and the LEFT sphenoid sinus. Opacification of multiple BILATERAL ethmoid air cells. RIGHT sphenoid sinus in both frontal sinuses well aerated. BILATERAL mastoid air cells and BILATERAL middle ear cavities well-aerated. Visualized orbits and globes normal in appearance. Other: None. IMPRESSION: 1. No acute intracranial abnormality. 2. Moderate to severe age-appropriate generalized atrophy and severe chronic microvascular ischemic changes of the white matter. 3. Chronic pansinusitis. Query superimposed chronic fungal infection in the RIGHT maxillary sinus. Electronically Signed   By: Evangeline Dakin M.D.   On: 09/15/2017 20:12   Dg Hip Unilat With Pelvis 2-3 Views Right  Result Date: 09/15/2017 CLINICAL DATA:  Fall, right hip pain EXAM: DG HIP (WITH OR WITHOUT PELVIS) 2-3V RIGHT COMPARISON:  None. FINDINGS: Right femoral intertrochanteric fracture noted with varus angulation. Lesser trochanter is displaced. No subluxation or dislocation. SI joints are symmetric and unremarkable. IMPRESSION: Right femoral intertrochanteric fracture with varus angulation and displacement of fracture fragments. Electronically Signed   By: Rolm Baptise M.D.   On: 09/15/2017 19:14    Procedures Procedures (including critical care time)  Medications Ordered in ED Medications  0.9 %  sodium chloride infusion (has no administration in time range)  ondansetron (ZOFRAN) injection 4 mg (has no administration in time range)  fentaNYL (SUBLIMAZE) injection 25 mcg (has no administration in time range)     Initial Impression / Assessment and Plan / ED Course  I have reviewed the triage vital signs and the nursing notes.  Pertinent labs & imaging results that were available during my care of the patient were reviewed by me and considered in my medical decision  making (see chart for details).     Elderly female presenting today after a fall in her apartment with right-sided pain.  Patient's right hip is shortened and rotated and concern for fracture.  She also has pain over her right chest and right arm.  She denies any shortness  of breath and vital signs are reassuring.  Patient does not take anticoagulation.  Will initiate hip fracture protocol.  Patient does have a most form which states she would not want an IV or fluids however when talking with the patient and asking if she would like an IV placed to receive pain medication she indicated that she would.  7:17 PM Patient's images are consistent with a hip fracture.  A right intertrochanteric which is angulated with fragments.  Speaking with the patient she states she does not wish to have this fixed.  Discussed with her that she would not be able to walk again she would not be able to return to her assisted living facility where she resides and patient states she understands and still would not want this repaired.  Will discuss with Ortho so that they are aware of the patient but will plan on admitting for pain control and placement in a long-term facility.  Will discuss findings with her family.  On reevaluation patient was noted to have ecchymosis over her right wrist and deformity.  We will do plain film to ensure no acute fracture.  7:59 PM Spoke with Dr. Alvan Dame who will follow the pt.  Also wrist image with comminuted fracture and splint placed.  NV intact.  Labs without acute findings.  Will admit the pt for further care  Final Clinical Impressions(s) / ED Diagnoses   Final diagnoses:  Displaced intertrochanteric fracture of right femur, initial encounter for closed fracture [facility])  Closed fracture of distal end of right radius, unspecified fracture morphology, initial encounter  Closed fracture of distal end of right ulna, unspecified fracture morphology, initial encounter    ED Discharge  Orders    None       Blanchie Dessert, MD 09/15/17 2046

## 2017-09-15 NOTE — ED Notes (Addendum)
Patient's son called from [place]. He is her POA, he wanted to know about care plan. Patient has not had Labs or xray. He is encouraged to call back.  MD notified of son's inquiries.  This RN verified home and cell number with PT's son.

## 2017-09-16 DIAGNOSIS — S72141A Displaced intertrochanteric fracture of right femur, initial encounter for closed fracture: Principal | ICD-10-CM

## 2017-09-16 DIAGNOSIS — Z7189 Other specified counseling: Secondary | ICD-10-CM

## 2017-09-16 DIAGNOSIS — S52501A Unspecified fracture of the lower end of right radius, initial encounter for closed fracture: Secondary | ICD-10-CM | POA: Diagnosis present

## 2017-09-16 DIAGNOSIS — W19XXXA Unspecified fall, initial encounter: Secondary | ICD-10-CM | POA: Diagnosis present

## 2017-09-16 DIAGNOSIS — Z515 Encounter for palliative care: Secondary | ICD-10-CM

## 2017-09-16 LAB — BASIC METABOLIC PANEL
Anion gap: 10 (ref 5–15)
BUN: 19 mg/dL (ref 8–23)
CO2: 27 mmol/L (ref 22–32)
Calcium: 8.7 mg/dL — ABNORMAL LOW (ref 8.9–10.3)
Chloride: 105 mmol/L (ref 98–111)
Creatinine, Ser: 1.42 mg/dL — ABNORMAL HIGH (ref 0.44–1.00)
GFR calc Af Amer: 35 mL/min — ABNORMAL LOW (ref >60)
GFR, EST NON AFRICAN AMERICAN: 31 mL/min — AB (ref >60)
GLUCOSE: 169 mg/dL — AB (ref 70–99)
POTASSIUM: 5 mmol/L (ref 3.5–5.1)
Sodium: 142 mmol/L (ref 135–145)

## 2017-09-16 LAB — CBC WITH DIFFERENTIAL/PLATELET
ABS IMMATURE GRANULOCYTES: 0.1 10*3/uL (ref 0.0–0.1)
Basophils Absolute: 0 10*3/uL (ref 0.0–0.1)
Basophils Relative: 0 %
Eosinophils Absolute: 0 10*3/uL (ref 0.0–0.7)
Eosinophils Relative: 0 %
HCT: 37.2 % (ref 36.0–46.0)
HEMOGLOBIN: 11.4 g/dL — AB (ref 12.0–15.0)
Immature Granulocytes: 1 %
LYMPHS ABS: 1.4 10*3/uL (ref 0.7–4.0)
Lymphocytes Relative: 13 %
MCH: 29 pg (ref 26.0–34.0)
MCHC: 30.6 g/dL (ref 30.0–36.0)
MCV: 94.7 fL (ref 78.0–100.0)
MONO ABS: 1 10*3/uL (ref 0.1–1.0)
Monocytes Relative: 9 %
NEUTROS ABS: 8.7 10*3/uL — AB (ref 1.7–7.7)
NEUTROS PCT: 77 %
Platelets: 251 10*3/uL (ref 150–400)
RBC: 3.93 MIL/uL (ref 3.87–5.11)
RDW: 14.2 % (ref 11.5–15.5)
WBC: 11.2 10*3/uL — ABNORMAL HIGH (ref 4.0–10.5)

## 2017-09-16 LAB — HEMOGLOBIN A1C
HEMOGLOBIN A1C: 6 % — AB (ref 4.8–5.6)
Mean Plasma Glucose: 125.5 mg/dL

## 2017-09-16 MED ORDER — OXYCODONE HCL 5 MG PO TABS
2.5000 mg | ORAL_TABLET | ORAL | Status: DC | PRN
  Administered 2017-09-19: 2.5 mg via ORAL
  Filled 2017-09-16: qty 1

## 2017-09-16 MED ORDER — SODIUM CHLORIDE 0.9 % IV BOLUS
500.0000 mL | Freq: Once | INTRAVENOUS | Status: AC
  Administered 2017-09-16: 500 mL via INTRAVENOUS

## 2017-09-16 MED ORDER — OXYCODONE HCL 5 MG PO TABS
5.0000 mg | ORAL_TABLET | ORAL | Status: DC | PRN
  Administered 2017-09-16: 5 mg via ORAL
  Filled 2017-09-16: qty 1

## 2017-09-16 MED ORDER — ACETAMINOPHEN 500 MG PO TABS
1000.0000 mg | ORAL_TABLET | Freq: Three times a day (TID) | ORAL | Status: DC
  Administered 2017-09-16 – 2017-09-21 (×14): 1000 mg via ORAL
  Filled 2017-09-16 (×14): qty 2

## 2017-09-16 MED ORDER — FAMOTIDINE 20 MG PO TABS
20.0000 mg | ORAL_TABLET | Freq: Every day | ORAL | Status: DC
  Administered 2017-09-17 – 2017-09-21 (×5): 20 mg via ORAL
  Filled 2017-09-16 (×5): qty 1

## 2017-09-16 MED ORDER — SENNA 8.6 MG PO TABS
1.0000 | ORAL_TABLET | Freq: Every day | ORAL | Status: DC
  Administered 2017-09-16 – 2017-09-20 (×5): 8.6 mg via ORAL
  Filled 2017-09-16 (×5): qty 1

## 2017-09-16 MED ORDER — HEPARIN SODIUM (PORCINE) 5000 UNIT/ML IJ SOLN
5000.0000 [IU] | Freq: Three times a day (TID) | INTRAMUSCULAR | Status: DC
  Administered 2017-09-16 – 2017-09-18 (×5): 5000 [IU] via SUBCUTANEOUS
  Filled 2017-09-16 (×5): qty 1

## 2017-09-16 MED ORDER — LACTATED RINGERS IV SOLN
INTRAVENOUS | Status: AC
  Administered 2017-09-16: 09:00:00 via INTRAVENOUS

## 2017-09-16 NOTE — Social Work (Signed)
CSW acknowledging pt from [facility], will follow for therapy recommendations.   Alexander Mt, [place] Work [phone]

## 2017-09-16 NOTE — Consult Note (Signed)
Reason for Consult: right hip fracture Referring Physician: Florene Glen, MD [facility])  Kimberly Greer is an 84 y.o. female.  HPI:  Kimberly Greer is a 84 y.o. female with history of COPD/asthma, hypothyroidism presents to the ER after patient had a fall.  Patient states she was walking to the bathroom when she slipped and fell.  Denies hitting head or losing consciousness.  She fell onto her right arm.  Since the fall patient has benign pain in the right hip and right wrist  Reviewed HPI with patient this am as well as her wishes  Past Medical History:  Diagnosis Date  . Asthma   . Carpal tunnel syndrome 03/07/2009  . Disturbance of skin sensation 02/21/2009  . Diverticulosis of colon (without mention of hemorrhage) 02/16/2009  . Fracture of upper end of left humerus 11/18/14  . 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 Hypothyroid   . Osteoarthrosis, unspecified whether generalized or localized, unspecified site 02/16/2009  . Other abnormal blood chemistry 06/19/2010   hyperglycemia  . Other and unspecified hyperlipidemia 10/24/2009  . Other atopic dermatitis and related conditions 02/16/2009  . Otosclerosis, unspecified 02/22/2003  . Pain in limb 12/05/2009  . Senile osteoporosis 06/16/2010  . Sensorineural hearing loss, unspecified 02/21/2009  . Syncope and collapse 05/30/2009  . Unspecified nasal polyp 02/21/2009  . Unspecified urinary incontinence 02/21/2009    Past Surgical History:  Procedure Laterality Date  . ABDOMINAL HYSTERECTOMY  1999   Dr. Collier Bullock  . BREAST BIOPSY  07/16/1990   benign left breast needle biopsy  Dr. Margot Chimes  . CARPAL TUNNEL RELEASE  04/07/2009   right Dr. Daylene Katayama  . CATARACT EXTRACTION W/ INTRAOCULAR LENS  IMPLANT, BILATERAL Bilateral   . TRIGGER FINGER RELEASE Left 08/09/2014   Procedure: RELEASE TRIGGER FINGER/A-1 PULLEY LEFT MIDDLE FINGER;  Surgeon: Daryll Brod, MD;  Location: [place];  Service: Orthopedics;  Laterality: Left;  REGIONAL/FAB    Family  History  Problem Relation Age of Onset  . Cancer Mother        stomach  . Cancer Father        bone  . Dementia Sister   . Cancer Brother        pancreataic  . Cancer Son        pancreatic    Social History:  reports that she has never smoked. She has never used smokeless tobacco. She reports that she does not drink alcohol or use drugs.  Allergies:  Allergies  Allergen Reactions  . Sulfa Antibiotics Itching and Rash  . Amoxicillin Other (See Comments)    Per [facility] 09/15/17 Has patient had a PCN reaction causing immediate rash, facial/tongue/throat swelling, SOB or lightheadedness with hypotension: Unknown Has patient had a PCN reaction causing severe rash involving mucus membranes or skin necrosis: Unknown Has patient had a PCN reaction that required hospitalization: Unknown Has patient had a PCN reaction occurring within the last 10 years: Unknown If all of the above answers are "NO", then may proceed with Cephalosporin use.  . Avelox [Moxifloxacin Hcl In Nacl] Itching  . Doxycycline Other (See Comments)    Unknown reaction  . Hista-Tabs [Triprolidine-Pse] Other (See Comments)    Passed out  . Pyrilamine-Phenylephrine Other (See Comments)    Unknown reaction - per [facility] 09/15/17  . Septra [Sulfamethoxazole-Trimethoprim] Other (See Comments)    Unknown reaction    Medications:  I have reviewed the patient's current medications. Scheduled: . escitalopram  10 mg Oral Daily  . famotidine  20 mg Oral BID  . fluticasone  2 spray Each Nare Daily  . lactose free nutrition  100-237 mL Oral Daily  . levothyroxine  25 mcg Oral QAC breakfast  . mirabegron ER  25 mg Oral QPM  . mirtazapine  15 mg Oral QPM  . mometasone-formoterol  2 puff Inhalation BID  . montelukast  10 mg Oral QPM    Results for orders placed or performed during the hospital encounter of 09/15/17 (from the past 24 hour(s))  Basic metabolic panel     Status: Abnormal   Collection Time: 09/15/17  7:07 PM  Result  Value Ref Range   Sodium 139 135 - 145 mmol/L   Potassium 4.7 3.5 - 5.1 mmol/L   Chloride 105 98 - 111 mmol/L   CO2 23 22 - 32 mmol/L   Glucose, Bld 157 (H) 70 - 99 mg/dL   BUN 18 8 - 23 mg/dL   Creatinine, Ser 1.09 (H) 0.44 - 1.00 mg/dL   Calcium 8.7 (L) 8.9 - 10.3 mg/dL   GFR calc non Af Amer 42 (L) >60 mL/min   GFR calc Af Amer 49 (L) >60 mL/min   Anion gap 11 5 - 15  CBC WITH DIFFERENTIAL     Status: Abnormal   Collection Time: 09/15/17  7:07 PM  Result Value Ref Range   WBC 16.6 (H) 4.0 - 10.5 K/uL   RBC 4.32 3.87 - 5.11 MIL/uL   Hemoglobin 12.7 12.0 - 15.0 g/dL   HCT 40.3 36.0 - 46.0 %   MCV 93.3 78.0 - 100.0 fL   MCH 29.4 26.0 - 34.0 pg   MCHC 31.5 30.0 - 36.0 g/dL   RDW 14.2 11.5 - 15.5 %   Platelets 252 150 - 400 K/uL   Neutrophils Relative % 85 %   Neutro Abs 14.0 (H) 1.7 - 7.7 K/uL   Lymphocytes Relative 7 %   Lymphs Abs 1.2 0.7 - 4.0 K/uL   Monocytes Relative 6 %   Monocytes Absolute 1.0 0.1 - 1.0 K/uL   Eosinophils Relative 1 %   Eosinophils Absolute 0.2 0.0 - 0.7 K/uL   Basophils Relative 0 %   Basophils Absolute 0.1 0.0 - 0.1 K/uL   Immature Granulocytes 1 %   Abs Immature Granulocytes 0.1 0.0 - 0.1 K/uL  Protime-INR     Status: None   Collection Time: 09/15/17  7:07 PM  Result Value Ref Range   Prothrombin Time 13.4 11.4 - 15.2 seconds   INR 1.03   Type and screen      Status: None   Collection Time: 09/15/17  7:11 PM  Result Value Ref Range   ABO/RH(D) A POS    Antibody Screen NEG    Sample Expiration      09/18/2017 Performed at [facility], [address], [place], [place] [address]   ABO/Rh     Status: None   Collection Time: 09/15/17  7:11 PM  Result Value Ref Range   ABO/RH(D)      A POS Performed at [facility] [address], [place], [place] [address]   CBC WITH DIFFERENTIAL     Status: Abnormal   Collection Time: 09/16/17  4:07 AM  Result Value Ref Range   WBC 11.2 (H) 4.0 - 10.5 K/uL    RBC 3.93 3.87 - 5.11 MIL/uL   Hemoglobin 11.4 (L) 12.0 - 15.0 g/dL   HCT 37.2 36.0 - 46.0 %   MCV  94.7 78.0 - 100.0 fL   MCH 29.0 26.0 - 34.0 pg   MCHC 30.6 30.0 - 36.0 g/dL   RDW 14.2 11.5 - 15.5 %   Platelets 251 150 - 400 K/uL   Neutrophils Relative % 77 %   Neutro Abs 8.7 (H) 1.7 - 7.7 K/uL   Lymphocytes Relative 13 %   Lymphs Abs 1.4 0.7 - 4.0 K/uL   Monocytes Relative 9 %   Monocytes Absolute 1.0 0.1 - 1.0 K/uL   Eosinophils Relative 0 %   Eosinophils Absolute 0.0 0.0 - 0.7 K/uL   Basophils Relative 0 %   Basophils Absolute 0.0 0.0 - 0.1 K/uL   Immature Granulocytes 1 %   Abs Immature Granulocytes 0.1 0.0 - 0.1 K/uL  Basic metabolic panel     Status: Abnormal   Collection Time: 09/16/17  4:07 AM  Result Value Ref Range   Sodium 142 135 - 145 mmol/L   Potassium 5.0 3.5 - 5.1 mmol/L   Chloride 105 98 - 111 mmol/L   CO2 27 22 - 32 mmol/L   Glucose, Bld 169 (H) 70 - 99 mg/dL   BUN 19 8 - 23 mg/dL   Creatinine, Ser 1.42 (H) 0.44 - 1.00 mg/dL   Calcium 8.7 (L) 8.9 - 10.3 mg/dL   GFR calc non Af Amer 31 (L) >60 mL/min   GFR calc Af Amer 35 (L) >60 mL/min   Anion gap 10 5 - 15    X-ray: CLINICAL DATA:  Fall, right hip pain  EXAM: DG HIP (WITH OR WITHOUT PELVIS) 2-3V RIGHT  COMPARISON:  None.  FINDINGS: Right femoral intertrochanteric fracture noted with varus angulation. Lesser trochanter is displaced. No subluxation or dislocation. SI joints are symmetric and unremarkable.  IMPRESSION: Right femoral intertrochanteric fracture with varus angulation and displacement of fracture fragments.   Electronically Signed   By: Rolm Baptise M.D.  CLINICAL DATA:  Fall with wrist deformity  EXAM: RIGHT WRIST - COMPLETE 3+ VIEW  COMPARISON:  None.  FINDINGS: There is a comminuted, predominantly transverse fracture of the distal right radius with moderate lateral displacement. There is also a minimally displaced fracture of the distal left ulna, involving  the ulnar styloid. There is severe first carpometacarpal joint osteoarthrosis. No carpal fracture is identified.  IMPRESSION: 1. Comminuted fracture of the distal right radius with lateral displacement. 2. Nondisplaced fracture of the distal left ulna. 3. Severe first CMC joint osteoarthrosis.   Electronically Signed   By: Ulyses Jarred M.D.  ROS  Per HPI No recent hospitalizations, illnesses  Blood pressure (!) 114/50, pulse (!) 55, temperature (!) 97.4 F (36.3 C), temperature source Oral, resp. rate 16, height 4\' 11"  (1.499 m), weight 65 kg, SpO2 98 %.  Physical Exam  Awake alert oriented Right wrist splint with bruising and swelling, unreduced NVI distally Right LE short/ER, NVI Left UE and LE with out deformity or bruising  General medical exam reviewed for pertinent findings  Assessment/Plan: Assessment: Displaced and comminuted right intertrochanteric femur fracture. 2.  Right displaced distal radius fracture  Plan: I reviewed with Ms. Robinson her presenting injuries to her right wrist and hip.  She expressed to the emergency room physicians as well as with me this morning that she wishes not to have surgical intervention.  I discussed with her the intentions of surgery in an effort to help with pain control and mobility.  At this point from medical perspective we would respect her wishes however I would recommend surgical  intervention for both her right hip and right wrist as I do feel that it would help with pain control and mobility.  Without surgical intervention she will be bedbound.  Foley catheter would be optional to prevent excessive rolling.  If her hip and/or wrist were to heal nonsurgically they would heal in a significant malunited position.  I do not feel based on her presenting history and medical conditions that this is excessive treatment.  If she does change her mind and wishes to proceed with surgical intervention in the orthopedic trauma team or myself  can be contacted to arrange for surgical intervention.  At this point I would recommend further pain control, ice to the right wrist and right hip and perhaps DVT prophylaxis while hospitalized.  If she does indeed elect not to proceed with surgical intervention then patient and family may be recommended or referred to palliative care to arrange for management of her hip and wrist.  Mauri Pole 09/16/2017, 7:33 AM

## 2017-09-16 NOTE — Progress Notes (Signed)
Spoke with on call MD, made aware of Son's concerns.  On call will follow up with primary team to contact Son regarding surgery.  [id number]

## 2017-09-16 NOTE — Progress Notes (Signed)
PROGRESS NOTE    Kimberly Greer  LOV:[id number] DOB: 1921-03-25 DOA: 09/15/2017 PCP: Blanchie Serve, MD   Brief Narrative:  Kimberly Greer is a 84 y.o. female with history of COPD/asthma, hypothyroidism presents to the ER after patient had a fall.  Patient states she was walking to the bathroom when she slipped and fell.  Denies hitting head or losing consciousness.  She fell onto her right arm.  Since the fall patient has benign pain in the right hip and right wrist.  Assessment & Plan:   Active Problems:   Hypothyroid   Memory loss   PVD (peripheral vascular disease) ([place])   Hip fracture (HCC)   Closed right hip fracture, initial encounter ([place])   COPD (chronic obstructive pulmonary disease) ([place])   1. Right hip fracture and right wrist fracture - patient declining surgery at this point.  When asked to elaborate, she notes she not desire additional potentially life prolonging interventions and is ready when it is her time.  Dr. Alvan Dame had discussion with her about recommendation for surgery for pain control and mobility.  I echoed this recommendation regarding quality of life with loss of mobility without surgery.  Will c/s palliative care to discuss further.     2. Syncope:  Described as mechanical fall in H&P, but pt states to me she "passed out" after getting up from using bathroom.  Orthostatic vs reflex syncope possible.  Cardiac etiology also possible with lack of prodrome.  EKG appears c/w priors.  Could consider echo, but given pt's desire for conservative care will hold off on further w/u for now.   3. History of asthma continue home inhalers.  4. Hypothyroidism continue synthroid  5. Acute renal failure holding lasix, follow with IVF  6. LBBB -denies any chest pain, chronic  7. Hyperglycemia -check hemoglobin A1c.  8. Leukocytosis - follow, reactive  9. Chronic Pansinusitis: ? Superimposed chronic fungal infection in R maxillary sinus.  Follow for now.   Afebrile.   DVT prophylaxis: heparin Code Status: DNR Family Communication: none at bedside Disposition Plan: pending further goc discussion and disposition   Consultants:   Ortho  palliative  Procedures:   none  Antimicrobials:  Anti-infectives (From admission, onward)   None     Subjective: Pain ok now. Does not want any potentially life prolonging procedures. Lives in ALF.  Has family in [place], coming down now.  Objective: Vitals:   09/15/17 2130 09/15/17 2145 09/16/17 0415 09/16/17 0832  BP: (!) 122/101 (!) 122/48 (!) 114/50   Pulse: (!) 46 67 (!) 55   Resp: 19 16 16    Temp:   (!) 97.4 F (36.3 C)   TempSrc:   Oral   SpO2: 97% 96% 98% 96%  Weight:      Height:       No intake or output data in the 24 hours ending 09/16/17 0902 Filed Weights   09/15/17 1719  Weight: 65 kg    Examination:  General exam: Appears calm and comfortable  Respiratory system: Clear to auscultation. Respiratory effort normal. Cardiovascular system: S1 & S2 heard, RRR.  Gastrointestinal system: Abdomen is nondistended, soft and nontender. Central nervous system: Alert and oriented. No focal neurological deficits. Extremities: R wrist in dressing, R hip shortened and externally rotated Skin: No rashes, lesions or ulcers Psychiatry: Judgement and insight appear normal. Mood & affect appropriate.     Data Reviewed: I have personally reviewed following labs and imaging studies  CBC: Recent Labs  Lab  09/15/17 1907 09/16/17 0407  WBC 16.6* 11.2*  NEUTROABS 14.0* 8.7*  HGB 12.7 11.4*  HCT 40.3 37.2  MCV 93.3 94.7  PLT 252 481   Basic Metabolic Panel: Recent Labs  Lab 09/15/17 1907 09/16/17 0407  NA 139 142  K 4.7 5.0  CL 105 105  CO2 23 27  GLUCOSE 157* 169*  BUN 18 19  CREATININE 1.09* 1.42*  CALCIUM 8.7* 8.7*   GFR: Estimated Creatinine Clearance: 19.8 mL/min (A) (by C-G formula based on SCr of 1.42 mg/dL (H)). Liver Function Tests: No results for input(s):  AST, ALT, ALKPHOS, BILITOT, PROT, ALBUMIN in the last 168 hours. No results for input(s): LIPASE, AMYLASE in the last 168 hours. No results for input(s): AMMONIA in the last 168 hours. Coagulation Profile: Recent Labs  Lab 09/15/17 1907  INR 1.03   Cardiac Enzymes: No results for input(s): CKTOTAL, CKMB, CKMBINDEX, TROPONINI in the last 168 hours. BNP (last 3 results) No results for input(s): PROBNP in the last 8760 hours. HbA1C: No results for input(s): HGBA1C in the last 72 hours. CBG: No results for input(s): GLUCAP in the last 168 hours. Lipid Profile: No results for input(s): CHOL, HDL, LDLCALC, TRIG, CHOLHDL, LDLDIRECT in the last 72 hours. Thyroid Function Tests: No results for input(s): TSH, T4TOTAL, FREET4, T3FREE, THYROIDAB in the last 72 hours. Anemia Panel: No results for input(s): VITAMINB12, FOLATE, FERRITIN, TIBC, IRON, RETICCTPCT in the last 72 hours. Sepsis Labs: No results for input(s): PROCALCITON, LATICACIDVEN in the last 168 hours.  No results found for this or any previous visit (from the past 240 hour(s)).       Radiology Studies: Dg Chest 1 View  Result Date: 09/15/2017 CLINICAL DATA:  Right shoulder pain. EXAM: CHEST  1 VIEW COMPARISON:  04/05/2009 FINDINGS: Hyperexpansion is consistent with emphysema. Cardiopericardial silhouette is at upper limits of normal for size. Bones are diffusely demineralized. Telemetry leads overlie the chest. IMPRESSION: Chronic changes without acute findings. Electronically Signed   By: Misty Stanley M.D.   On: 09/15/2017 19:14   Dg Shoulder Right  Result Date: 09/15/2017 CLINICAL DATA:  Fall, right shoulder pain EXAM: RIGHT SHOULDER - 2+ VIEW COMPARISON:  None. FINDINGS: Degenerative changes in the [facility] joint with joint space narrowing and spurring. Glenohumeral joint is maintained. No acute bony abnormality. Specifically, no fracture, subluxation, or dislocation. Soft tissues are intact. IMPRESSION: No acute bony  abnormality. Electronically Signed   By: Rolm Baptise M.D.   On: 09/15/2017 19:14   Dg Wrist Complete Right  Result Date: 09/15/2017 CLINICAL DATA:  Fall with wrist deformity EXAM: RIGHT WRIST - COMPLETE 3+ VIEW COMPARISON:  None. FINDINGS: There is a comminuted, predominantly transverse fracture of the distal right radius with moderate lateral displacement. There is also a minimally displaced fracture of the distal left ulna, involving the ulnar styloid. There is severe first carpometacarpal joint osteoarthrosis. No carpal fracture is identified. IMPRESSION: 1. Comminuted fracture of the distal right radius with lateral displacement. 2. Nondisplaced fracture of the distal left ulna. 3. Severe first CMC joint osteoarthrosis. Electronically Signed   By: Ulyses Jarred M.D.   On: 09/15/2017 20:31   Ct Head Wo Contrast  Result Date: 09/15/2017 CLINICAL DATA:  84 year old who fell in her bathroom earlier today and was unable to get up on her own. It is unclear how long the patient was on the floor. Initial encounter. EXAM: CT HEAD WITHOUT CONTRAST TECHNIQUE: Contiguous axial images were obtained from the base of the skull through the  vertex without intravenous contrast. COMPARISON:  11/06/2014, 04/25/2011, 09/11/2006. FINDINGS: Brain: Significant head tilt in the gantry accounts for apparent asymmetry in the cerebral hemispheres. Moderate to severe age appropriate cortical, deep and cerebellar atrophy, unchanged. Severe changes of small vessel disease of the white matter diffusely, unchanged. No mass lesion. No midline shift. No acute hemorrhage or hematoma. No extra-axial fluid collections. No evidence of acute infarction. Vascular: Moderate BILATERAL carotid siphon atherosclerosis. No hyperdense vessel. Skull: No skull fracture or other focal osseous abnormality involving the skull. Sinuses/Orbits: Near complete opacification of the RIGHT maxillary sinus with hyperdense material in the sinus. Mucosal thickening  involving the LEFT maxillary sinus and the LEFT sphenoid sinus. Opacification of multiple BILATERAL ethmoid air cells. RIGHT sphenoid sinus in both frontal sinuses well aerated. BILATERAL mastoid air cells and BILATERAL middle ear cavities well-aerated. Visualized orbits and globes normal in appearance. Other: None. IMPRESSION: 1. No acute intracranial abnormality. 2. Moderate to severe age-appropriate generalized atrophy and severe chronic microvascular ischemic changes of the white matter. 3. Chronic pansinusitis. Query superimposed chronic fungal infection in the RIGHT maxillary sinus. Electronically Signed   By: Evangeline Dakin M.D.   On: 09/15/2017 20:12   Dg Hip Unilat With Pelvis 2-3 Views Right  Result Date: 09/15/2017 CLINICAL DATA:  Fall, right hip pain EXAM: DG HIP (WITH OR WITHOUT PELVIS) 2-3V RIGHT COMPARISON:  None. FINDINGS: Right femoral intertrochanteric fracture noted with varus angulation. Lesser trochanter is displaced. No subluxation or dislocation. SI joints are symmetric and unremarkable. IMPRESSION: Right femoral intertrochanteric fracture with varus angulation and displacement of fracture fragments. Electronically Signed   By: Rolm Baptise M.D.   On: 09/15/2017 19:14        Scheduled Meds: . escitalopram  10 mg Oral Daily  . famotidine  20 mg Oral BID  . fluticasone  2 spray Each Nare Daily  . lactose free nutrition  100-237 mL Oral Daily  . levothyroxine  25 mcg Oral QAC breakfast  . mirabegron ER  25 mg Oral QPM  . mirtazapine  15 mg Oral QPM  . mometasone-formoterol  2 puff Inhalation BID  . montelukast  10 mg Oral QPM   Continuous Infusions: . sodium chloride 10 mL/hr at 09/16/17 0546  . lactated ringers 75 mL/hr at 09/16/17 0846     LOS: 1 day    Time spent: over 30 min MDM high with trauma with R wrist and hip fx.     Fayrene Helper, MD [facility] Pager [phone]  If 7PM-7AM, please contact night-coverage [email or web address] Password  [email or web address] 09/16/2017, 9:02 AM

## 2017-09-16 NOTE — Progress Notes (Signed)
Nutrition Consult/Brief Note  RD consulted per hip fracture protocol.  Per weight readings below, pt's weight has been stable.  Wt Readings from Last 15 Encounters:  09/15/17 65 kg  08/25/17 62.4 kg  07/17/17 61.4 kg  05/29/17 58.9 kg  03/03/17 58.3 kg  12/17/16 57.4 kg  12/02/16 57.6 kg  11/25/16 57.2 kg  11/18/16 56.9 kg  11/11/16 57.2 kg  09/23/16 56.2 kg  08/28/16 56.2 kg  08/26/16 56.5 kg  08/19/16 57.2 kg  08/12/16 57.6 kg   Body mass index is 28.94 kg/m. Pt meets criteria for Overweight based on current BMI.   Current diet order is Heart Healthy. Boost Plus nutrition supplement ordered daily 8/13.   Labs and medications reviewed. No further nutrition interventions warranted at this time.  If nutrition issues arise, please consult RD.   [name] Holms, RD, LDN Pager #: [phone] After-Hours Pager #: [phone]

## 2017-09-16 NOTE — Consult Note (Signed)
Consultation Note Date: 09/16/2017   Patient Name: Kimberly Greer  DOB: 05-25-1921  MRN: [id number]  Age / Sex: 84 y.o., female  PCP: Blanchie Serve, MD Referring Physician: Elodia Florence., *  Reason for Consultation: Establishing goals of care  HPI/Patient Profile: 84 y.o. female  with past medical history of COPD, asthma, hypothyroidism, diverticulosis, osteoarthritis, PVD, and depression admitted on 09/15/2017 after fall at ALF. Patient slipped and fell onto her right arm in bathroom. Complaining of right hip and wrist pain. Xrays revealed right hip and right wrist fractures. Orthopedics consulted and recommending surgical interventions to manage pain and mobility. Per surgery, without surgical intervention patient will be bed-bound and fractures will heal in a significant malunited position. Palliative medicine consultation for goals of care.    Clinical Assessment and Goals of Care:  I have reviewed medical records, discussed with care team, and met with patient, son (Art), and daughter-in-law at bedside to discuss diagnosis, GOC, and EOL wishes. Art just arrived this afternoon from [place].   Introduced Palliative Medicine as specialized medical care for people living with serious illness. It focuses on providing relief from the symptoms and stress of a serious illness. The goal is to improve quality of life for both the patient and the family.  We discussed a brief life review of the patient. Patient originally from [place] and moved here for her husband's job. She has been living at [facility] for at least 7-8 years. Baseline prior to fall, patient ambulating with walker and requiring minimal assist with ADL's. Good appetite. Participates in some activities at the facility.   Discussed events leading up to hospitalization, hospital diagnoses and interventions.   Discussed  recommendations per ortho for surgical intervention in order to assist with pain management and mobility. Explained to patient that she will be bedbound and require higher level of care if she wishes to NOT pursue surgical intervention. Discussed utilizing medications for pain management to maintain comfort and quality when surgical intervention is declined.   Son and DIL encouraging patient to consider surgical intervention in hopes of her being able to ambulate again. Also understanding this will help control pain.   I attempted to elicit values and goals of care important to the patient. She speaks of not wanting the surgery because she does not want any "life-prolonging" interventions. Patient speaks of NOT wanting heroic measures at EOL. At this point in the conversation, family explained that per conversations with ER physician and ortho this may not necessarily prolong her life but provide pain relief and may help her "quality of life" by being able to ambulate again. Family believes she should pursue surgical intervention.   Questions and concerns were addressed. Encouraged patient to continue discussions with her family tonight and that this is her decision to make and we will respect her wishes regardless of what she decides.     SUMMARY OF RECOMMENDATIONS    Son and DIL arrived from [place] this afternoon. Discussed diagnoses, interventions, and  ortho recommendations. Son and DIL feel she should pursue surgical intervention in order to help her "quality of life" if pain is managed and the ability to mobilize again.   Patient previously declined surgical intervention but now considering. She is not ready to make a decision during my visit. Answered questions and concerns and encouraged patient to continue conversations with her family.  PMT will follow.   Code Status/Advance Care Planning:  DNR  Symptom Management:   Agree with scheduled Tylenol 1090m TID  Continue prn  oxycodone for moderate pain  Continue prn morphine for severe pain  Senna 1tab daily  Palliative Prophylaxis:   Aspiration, Bowel Regimen, Delirium Protocol, Frequent Pain Assessment, Oral Care and Turn Reposition  Psycho-social/Spiritual:   Desire for further Chaplaincy support:yes  Additional Recommendations: Caregiving  Support/Resources  Prognosis:   Unable to determine  Discharge Planning: To Be Determined      Primary Diagnoses: Present on Admission: . Hip fracture ([facility] . PVD (peripheral vascular disease) ([facility] . Memory loss . Hypothyroid . Closed right hip fracture, initial encounter ([facility] . COPD (chronic obstructive pulmonary disease) ([facility]   I have reviewed the medical record, interviewed the patient and family, and examined the patient. The following aspects are pertinent.  Past Medical History:  Diagnosis Date  . Asthma   . Carpal tunnel syndrome 03/07/2009  . Disturbance of skin sensation 02/21/2009  . Diverticulosis of colon (without mention of hemorrhage) 02/16/2009  . Fracture of upper end of left humerus 11/18/14  . 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 Hypothyroid   . Osteoarthrosis, unspecified whether generalized or localized, unspecified site 02/16/2009  . Other abnormal blood chemistry 06/19/2010   hyperglycemia  . Other and unspecified hyperlipidemia 10/24/2009  . Other atopic dermatitis and related conditions 02/16/2009  . Otosclerosis, unspecified 02/22/2003  . Pain in limb 12/05/2009  . Senile osteoporosis 06/16/2010  . Sensorineural hearing loss, unspecified 02/21/2009  . Syncope and collapse 05/30/2009  . Unspecified nasal polyp 02/21/2009  . Unspecified urinary incontinence 02/21/2009   Social History   Socioeconomic History  . Marital status: Widowed    Spouse name: Not on file  . Number of children: Not on file  . Years of education: Not on file  . Highest education level: Not on file  Occupational History  . Occupation: retired  [employer] account executive Social Needs  . Financial resource strain: Not on file  . Food insecurity:    Worry: Not on file    Inability: Not on file  . Transportation needs:    Medical: Not on file    Non-medical: Not on file  Tobacco Use  . Smoking status: Never Smoker  . Smokeless tobacco: Never Used  Substance and Sexual Activity  . Alcohol use: No  . Drug use: No  . Sexual activity: Never  Lifestyle  . Physical activity:    Days per week: Not on file    Minutes per session: Not on file  . Stress: Not on file  Relationships  . Social connections:    Talks on phone: Not on file    Gets together: Not on file    Attends religious service: Not on file    Active member of club or organization: Not on file    Attends meetings of clubs or organizations: Not on file    Relationship status: Not on file  Other Topics Concern  . Not on file  Social History Narrative   [facility] [place]   Widowed 11/2006   Walks with cane   Never smoked   Alcohol none   Exercise -walks daily    POA/Living Will   Family History  Problem Relation Age of Onset  . Cancer Mother        stomach  . Cancer Father        bone  . Dementia Sister   . Cancer Brother        pancreataic  . Cancer Son        pancreatic   Scheduled Meds: . acetaminophen  1,000 mg Oral Q8H  . escitalopram  10 mg Oral Daily  . [START ON 09/17/2017] famotidine  20 mg Oral Daily  . fluticasone  2 spray Each Nare Daily  . heparin injection (subcutaneous)  5,000 Units Subcutaneous Q8H  . lactose free nutrition  100-237 mL Oral Daily  . levothyroxine  25 mcg Oral QAC breakfast  . mirabegron ER  25 mg Oral QPM  . mirtazapine  15 mg Oral QPM  . mometasone-formoterol  2 puff Inhalation BID  . montelukast  10 mg Oral QPM   Continuous Infusions: . sodium chloride 10 mL/hr at 09/16/17 0546  . lactated ringers 75 mL/hr at 09/16/17 0846   PRN Meds:.albuterol, ipratropium, morphine  injection, oxyCODONE **OR** oxyCODONE Medications Prior to Admission:  Prior to Admission medications   Medication Sig Start Date End Date Taking? Authorizing Provider  acetaminophen (TYLENOL) 325 MG tablet Take 2 tablets (650 mg total) by mouth every 6 (six) hours as needed for mild pain or moderate pain. Patient taking differently: Take 650 mg by mouth every 6 (six) hours as needed (pain).  11/06/14  Yes Forde Dandy, MD  albuterol (VENTOLIN HFA) 108 (90 Base) MCG/ACT inhaler Inhale 2 puffs into the lungs every 6 (six) hours as needed for shortness of breath (dyspnea).    Yes [provider]  escitalopram (LEXAPRO) 10 MG tablet Take 10 mg by mouth daily. 11/30/16  Yes [provider]  feeding supplement (BOOST HIGH PROTEIN) LIQD Take 100-237 mLs by mouth daily.    Yes [provider]  fluticasone (FLONASE) 50 MCG/ACT nasal spray Place 2 sprays into both nostrils daily.   Yes [provider]  Fluticasone-Salmeterol (ADVAIR) 250-50 MCG/DOSE AEPB Inhale 1 puff 2 (two) times daily into the lungs. Rinse mouth after use   Yes [provider]  furosemide (LASIX) 40 MG tablet Take 40 mg by mouth daily.    Yes [provider]  ipratropium (ATROVENT) 0.02 % nebulizer solution Take 0.5 mg by nebulization every 6 (six) hours as needed for wheezing or shortness of breath.   Yes [provider]  levothyroxine (SYNTHROID, LEVOTHROID) 25 MCG tablet Take 25 mcg by mouth daily before breakfast.   Yes [provider]  mirtazapine (REMERON) 15 MG tablet Take 15 mg by mouth every evening.    Yes [provider]  montelukast (SINGULAIR) 10 MG tablet Take one tablet by mouth once daily to help breathing Patient taking differently: Take 10 mg by mouth every evening. to help breathing 06/07/14  Yes [name], MD  MYRBETRIQ 25 MG TB24 tablet TAKE ONE TABLET BY MOUTH ONCE DAILY Patient taking differently: Take 25 mg by mouth every evening.   03/30/14  Yes [name], MD  OXYGEN Inhale 2 L/min into the lungs as needed (to keep O2 SATS >90%).    Yes [provider]  raloxifene (EVISTA) 60 MG tablet Take 1 tablet  by mouth  daily to treat osteoporosis Patient taking differently: Take 60 mg by mouth every evening.  09/23/14  Yes [name], MD  ranitidine (ZANTAC) 150 MG tablet Take 150 mg by mouth daily.   Yes [provider]  zinc oxide 20 % ointment Apply 1 application topically See admin instructions. Start date 09/08/17: apply topically to buttocks every shift for 10 days  and as needed after incontinent episodes.   Yes [provider]   Allergies  Allergen Reactions  . Sulfa Antibiotics Itching and Rash  . Amoxicillin Other (See Comments)    Per [facility] 09/15/17 Has patient had a PCN reaction causing immediate rash, facial/tongue/throat swelling, SOB or lightheadedness with hypotension: Unknown Has patient had a PCN reaction causing severe rash involving mucus membranes or skin necrosis: Unknown Has patient had a PCN reaction that required hospitalization: Unknown Has patient had a PCN reaction occurring within the last 10 years: Unknown If all of the above answers are "NO", then may proceed with Cephalosporin use.  . Avelox [Moxifloxacin Hcl In Nacl] Itching  . Doxycycline Other (See Comments)    Unknown reaction  . Hista-Tabs [Triprolidine-Pse] Other (See Comments)    Passed out  . Pyrilamine-Phenylephrine Other (See Comments)    Unknown reaction - per [facility] 09/15/17  . Septra [Sulfamethoxazole-Trimethoprim] Other (See Comments)    Unknown reaction   Review of Systems  Constitutional: Positive for activity change.  Neurological: Positive for weakness.   Physical Exam  Constitutional: She is oriented to person, place, and time. She is cooperative. She appears ill.  HENT:  Head: Normocephalic and atraumatic.  Pulmonary/Chest: No accessory muscle usage. No tachypnea. No respiratory distress.    Abdominal: There is tenderness.  Neurological: She is alert and oriented to person, place, and time.  Skin: Skin is warm and dry.  Psychiatric: She has a normal mood and affect. Her speech is normal and behavior is normal. Cognition and memory are normal.  Nursing note and vitals reviewed.  Vital Signs: BP (!) 109/47 (BP Location: Left Arm)   Pulse (!) 55   Temp 98 F (36.7 C) (Oral)   Resp 20   Ht 4' 11" (1.499 m)   Wt 65 kg   SpO2 100%   BMI 28.94 kg/m  Pain Scale: Faces   Pain Score: Asleep   SpO2: SpO2: 100 % O2 Device:SpO2: 100 % O2 Flow Rate: .O2 Flow Rate (L/min): 2 L/min  IO: Intake/output summary: No intake or output data in the 24 hours ending 09/16/17 1439  LBM: Last BM Date: 09/15/17 Baseline Weight: Weight: 65 kg Most recent weight: Weight: 65 kg     Palliative Assessment/Data: PPS 50%   Flowsheet Rows     Most Recent Value  Intake Tab  Referral Department  Hospitalist  Unit at Time of Referral  Med/Surg Unit  Palliative Care Primary Diagnosis  Trauma  Palliative Care Type  New Palliative care  Reason for referral  Clarify Goals of Care  Date first seen by Palliative Care  09/16/17  Clinical Assessment  Palliative Performance Scale Score  50%  Psychosocial & Spiritual Assessment  Palliative Care Outcomes  Patient/Family meeting held?  Yes  Who was at the meeting?  patient, son, daughter-in-law  Palliative Care Outcomes  Clarified goals of care, Provided psychosocial or spiritual support, ACP counseling assistance      Time In: 1300 Time Out: 1335 Time Total: 47mn Greater than 50%  of this time was spent counseling and coordinating care related  to the above assessment and plan.  Signed by:  Ihor Dow, FNP-C Palliative Medicine Team  Phone: [phone] Fax: [phone]   Please contact Palliative Medicine Team phone at [phone] for questions and concerns.  For individual provider: See Shea Evans

## 2017-09-16 NOTE — Progress Notes (Addendum)
Pt's Son Arnell Sieving [phone] would like someone to call him regarding plan for surgery.  Pt is agreeable to have the surgery done.  Son is only here until Friday and would like to have surgery done prior leaving.  Please give him a call as soon as possible.  [id number]

## 2017-09-17 ENCOUNTER — Inpatient Hospital Stay (HOSPITAL_COMMUNITY): Payer: Medicare Other | Admitting: Anesthesiology

## 2017-09-17 ENCOUNTER — Encounter (HOSPITAL_COMMUNITY)
Admission: EM | Disposition: A | Discharge status: Skilled Nursing Facility | Payer: Self-pay | Source: Home / Self Care | Attending: Family Medicine

## 2017-09-17 ENCOUNTER — Encounter (HOSPITAL_COMMUNITY): Payer: Self-pay | Admitting: Orthopedic Surgery

## 2017-09-17 ENCOUNTER — Inpatient Hospital Stay (HOSPITAL_COMMUNITY): Payer: Medicare Other

## 2017-09-17 ENCOUNTER — Encounter: Payer: Self-pay | Admitting: Internal Medicine

## 2017-09-17 HISTORY — PX: INTRAMEDULLARY (IM) NAIL INTERTROCHANTERIC: SHX5875

## 2017-09-17 HISTORY — PX: OPEN REDUCTION INTERNAL FIXATION (ORIF) DISTAL RADIAL FRACTURE: SHX5989

## 2017-09-17 LAB — BASIC METABOLIC PANEL
Anion gap: 6 (ref 5–15)
BUN: 20 mg/dL (ref 8–23)
CALCIUM: 8.3 mg/dL — AB (ref 8.9–10.3)
CO2: 25 mmol/L (ref 22–32)
CREATININE: 1.08 mg/dL — AB (ref 0.44–1.00)
Chloride: 110 mmol/L (ref 98–111)
GFR calc Af Amer: 49 mL/min — ABNORMAL LOW (ref >60)
GFR calc non Af Amer: 43 mL/min — ABNORMAL LOW (ref >60)
GLUCOSE: 119 mg/dL — AB (ref 70–99)
Potassium: 4 mmol/L (ref 3.5–5.1)
Sodium: 141 mmol/L (ref 135–145)

## 2017-09-17 LAB — CBC
HEMATOCRIT: 31.2 % — AB (ref 36.0–46.0)
Hemoglobin: 9.8 g/dL — ABNORMAL LOW (ref 12.0–15.0)
MCH: 29.9 pg (ref 26.0–34.0)
MCHC: 31.4 g/dL (ref 30.0–36.0)
MCV: 95.1 fL (ref 78.0–100.0)
Platelets: 204 10*3/uL (ref 150–400)
RBC: 3.28 MIL/uL — ABNORMAL LOW (ref 3.87–5.11)
RDW: 14.3 % (ref 11.5–15.5)
WBC: 10.4 10*3/uL (ref 4.0–10.5)

## 2017-09-17 LAB — MRSA PCR SCREENING: MRSA by PCR: NEGATIVE

## 2017-09-17 LAB — MAGNESIUM: Magnesium: 2.3 mg/dL (ref 1.7–2.4)

## 2017-09-17 SURGERY — FIXATION, FRACTURE, INTERTROCHANTERIC, WITH INTRAMEDULLARY ROD
Anesthesia: Regional | Site: Wrist | Laterality: Right

## 2017-09-17 MED ORDER — ROCURONIUM BROMIDE 100 MG/10ML IV SOLN
INTRAVENOUS | Status: DC | PRN
  Administered 2017-09-17: 30 mg via INTRAVENOUS

## 2017-09-17 MED ORDER — PROPOFOL 10 MG/ML IV BOLUS
INTRAVENOUS | Status: AC
  Filled 2017-09-17: qty 20

## 2017-09-17 MED ORDER — CEFAZOLIN SODIUM-DEXTROSE 1-4 GM/50ML-% IV SOLN
1.0000 g | Freq: Four times a day (QID) | INTRAVENOUS | Status: AC
  Administered 2017-09-17 – 2017-09-18 (×3): 1 g via INTRAVENOUS
  Filled 2017-09-17 (×3): qty 50

## 2017-09-17 MED ORDER — GLYCOPYRROLATE PF 0.2 MG/ML IJ SOSY
PREFILLED_SYRINGE | INTRAMUSCULAR | Status: DC | PRN
  Administered 2017-09-17: 0.4 mg via INTRAVENOUS

## 2017-09-17 MED ORDER — ALBUMIN HUMAN 5 % IV SOLN
INTRAVENOUS | Status: DC | PRN
  Administered 2017-09-17: 16:00:00 via INTRAVENOUS

## 2017-09-17 MED ORDER — ONDANSETRON HCL 4 MG/2ML IJ SOLN
4.0000 mg | Freq: Four times a day (QID) | INTRAMUSCULAR | Status: DC | PRN
Start: 2017-09-17 — End: 2017-09-21

## 2017-09-17 MED ORDER — ROCURONIUM BROMIDE 10 MG/ML (PF) SYRINGE
PREFILLED_SYRINGE | INTRAVENOUS | Status: AC
  Filled 2017-09-17: qty 10

## 2017-09-17 MED ORDER — FENTANYL CITRATE (PF) 100 MCG/2ML IJ SOLN
INTRAMUSCULAR | Status: AC
Start: 2017-09-17 — End: 2017-09-17
  Administered 2017-09-17: 25 ug via INTRAVENOUS
  Filled 2017-09-17: qty 2

## 2017-09-17 MED ORDER — LACTATED RINGERS IV SOLN: INTRAVENOUS | Status: DC

## 2017-09-17 MED ORDER — ONDANSETRON HCL 4 MG PO TABS: 4.0000 mg | ORAL_TABLET | Freq: Four times a day (QID) | ORAL | Status: DC | PRN

## 2017-09-17 MED ORDER — LACTATED RINGERS IV SOLN
INTRAVENOUS | Status: AC
  Administered 2017-09-17: 15:00:00 via INTRAVENOUS

## 2017-09-17 MED ORDER — POVIDONE-IODINE 10 % EX SWAB: 2.0000 "application " | Freq: Once | CUTANEOUS | Status: DC

## 2017-09-17 MED ORDER — ONDANSETRON HCL 4 MG/2ML IJ SOLN
INTRAMUSCULAR | Status: AC
  Filled 2017-09-17: qty 2

## 2017-09-17 MED ORDER — FENTANYL CITRATE (PF) 100 MCG/2ML IJ SOLN: 25.0000 ug | INTRAMUSCULAR | Status: DC | PRN

## 2017-09-17 MED ORDER — ONDANSETRON HCL 4 MG/2ML IJ SOLN
INTRAMUSCULAR | Status: DC | PRN
  Administered 2017-09-17: 4 mg via INTRAVENOUS

## 2017-09-17 MED ORDER — METOCLOPRAMIDE HCL 5 MG PO TABS: 5.0000 mg | ORAL_TABLET | Freq: Three times a day (TID) | ORAL | Status: DC | PRN

## 2017-09-17 MED ORDER — CHLORHEXIDINE GLUCONATE 4 % EX LIQD: 60.0000 mL | Freq: Once | CUTANEOUS | Status: DC

## 2017-09-17 MED ORDER — ROPIVACAINE HCL 7.5 MG/ML IJ SOLN
INTRAMUSCULAR | Status: DC | PRN
  Administered 2017-09-17: 20 mL via PERINEURAL

## 2017-09-17 MED ORDER — DOCUSATE SODIUM 100 MG PO CAPS
100.0000 mg | ORAL_CAPSULE | Freq: Two times a day (BID) | ORAL | Status: DC
  Administered 2017-09-17 – 2017-09-21 (×8): 100 mg via ORAL
  Filled 2017-09-17 (×8): qty 1

## 2017-09-17 MED ORDER — PHENYLEPHRINE 40 MCG/ML (10ML) SYRINGE FOR IV PUSH (FOR BLOOD PRESSURE SUPPORT)
PREFILLED_SYRINGE | INTRAVENOUS | Status: AC
  Filled 2017-09-17: qty 10

## 2017-09-17 MED ORDER — FENTANYL CITRATE (PF) 100 MCG/2ML IJ SOLN
25.0000 ug | Freq: Once | INTRAMUSCULAR | Status: AC
  Administered 2017-09-17: 25 ug via INTRAVENOUS

## 2017-09-17 MED ORDER — METOCLOPRAMIDE HCL 5 MG/ML IJ SOLN: 5.0000 mg | Freq: Three times a day (TID) | INTRAMUSCULAR | Status: DC | PRN

## 2017-09-17 MED ORDER — GLYCOPYRROLATE PF 0.2 MG/ML IJ SOSY
PREFILLED_SYRINGE | INTRAMUSCULAR | Status: AC
  Filled 2017-09-17: qty 1

## 2017-09-17 MED ORDER — PHENYLEPHRINE 40 MCG/ML (10ML) SYRINGE FOR IV PUSH (FOR BLOOD PRESSURE SUPPORT)
PREFILLED_SYRINGE | INTRAVENOUS | Status: DC | PRN
  Administered 2017-09-17 (×3): 80 ug via INTRAVENOUS

## 2017-09-17 MED ORDER — NEOSTIGMINE METHYLSULFATE 10 MG/10ML IV SOLN
INTRAVENOUS | Status: DC | PRN
  Administered 2017-09-17: 3 mg via INTRAVENOUS

## 2017-09-17 MED ORDER — LACTATED RINGERS IV SOLN
INTRAVENOUS | Status: DC | PRN
  Administered 2017-09-17 (×2): via INTRAVENOUS

## 2017-09-17 MED ORDER — PROPOFOL 10 MG/ML IV BOLUS
INTRAVENOUS | Status: DC | PRN
  Administered 2017-09-17: 50 mg via INTRAVENOUS

## 2017-09-17 MED ORDER — SODIUM CHLORIDE 0.9 % IV SOLN
INTRAVENOUS | Status: DC | PRN
  Administered 2017-09-17: 100 ug/min via INTRAVENOUS

## 2017-09-17 MED ORDER — LIDOCAINE 2% (20 MG/ML) 5 ML SYRINGE
INTRAMUSCULAR | Status: AC
  Filled 2017-09-17: qty 5

## 2017-09-17 MED ORDER — FENTANYL CITRATE (PF) 250 MCG/5ML IJ SOLN
INTRAMUSCULAR | Status: AC
  Filled 2017-09-17: qty 5

## 2017-09-17 MED ORDER — FENTANYL CITRATE (PF) 100 MCG/2ML IJ SOLN
INTRAMUSCULAR | Status: DC | PRN
  Administered 2017-09-17: 50 ug via INTRAVENOUS
  Administered 2017-09-17: 100 ug via INTRAVENOUS

## 2017-09-17 MED ORDER — 0.9 % SODIUM CHLORIDE (POUR BTL) OPTIME
TOPICAL | Status: DC | PRN
Start: 2017-09-17 — End: 2017-09-17
  Administered 2017-09-17 (×2): 1000 mL

## 2017-09-17 MED ORDER — CEFAZOLIN SODIUM-DEXTROSE 2-4 GM/100ML-% IV SOLN
2.0000 g | INTRAVENOUS | Status: AC
  Administered 2017-09-17: 2 g via INTRAVENOUS
  Filled 2017-09-17 (×2): qty 100

## 2017-09-17 SURGICAL SUPPLY — 53 items
ALCOHOL 70% 16 OZ (MISCELLANEOUS) ×4 IMPLANT
BANDAGE ACE 3X5.8 VEL STRL LF (GAUZE/BANDAGES/DRESSINGS) ×2 IMPLANT
BIT DRILL 2.2 SS TIBIAL (BIT) ×2 IMPLANT
BIT DRILL FLUTED FEMUR 4.2/3 (BIT) ×2 IMPLANT
BNDG COHESIVE 6X5 TAN STRL LF (GAUZE/BANDAGES/DRESSINGS) ×8 IMPLANT
BNDG GAUZE ELAST 4 BULKY (GAUZE/BANDAGES/DRESSINGS) ×2 IMPLANT
CANISTER SUCTION WELLS/JOHNSON (MISCELLANEOUS) ×4 IMPLANT
COVER PERINEAL POST (MISCELLANEOUS) ×4 IMPLANT
COVER SURGICAL LIGHT HANDLE (MISCELLANEOUS) ×6 IMPLANT
DRAPE C-ARM 42X72 X-RAY (DRAPES) ×2 IMPLANT
DRAPE HALF SHEET 40X57 (DRAPES) IMPLANT
DRAPE INCISE IOBAN 66X45 STRL (DRAPES) ×4 IMPLANT
DRAPE OEC MINIVIEW 54X84 (DRAPES) ×2 IMPLANT
DRAPE STERI IOBAN 125X83 (DRAPES) ×4 IMPLANT
DRSG ADAPTIC 3X8 NADH LF (GAUZE/BANDAGES/DRESSINGS) ×4 IMPLANT
DURAPREP 26ML APPLICATOR (WOUND CARE) ×4 IMPLANT
ELECT CAUTERY BLADE 6.4 (BLADE) ×4 IMPLANT
ELECT REM PT RETURN 9FT ADLT (ELECTROSURGICAL) ×4
ELECTRODE REM PT RTRN 9FT ADLT (ELECTROSURGICAL) ×2 IMPLANT
GAUZE SPONGE 4X4 12PLY STRL (GAUZE/BANDAGES/DRESSINGS) ×6 IMPLANT
GAUZE SPONGE 4X4 12PLY STRL LF (GAUZE/BANDAGES/DRESSINGS) ×4 IMPLANT
GAUZE XEROFORM 1X8 LF (GAUZE/BANDAGES/DRESSINGS) ×2 IMPLANT
GLOVE BIO SURGEON STRL SZ7.5 (GLOVE) ×4 IMPLANT
GLOVE BIOGEL PI IND STRL 8 (GLOVE) ×2 IMPLANT
GLOVE BIOGEL PI INDICATOR 8 (GLOVE) ×2
GOWN STRL REUS W/ TWL LRG LVL3 (GOWN DISPOSABLE) ×2 IMPLANT
GOWN STRL REUS W/ TWL XL LVL3 (GOWN DISPOSABLE) ×2 IMPLANT
GOWN STRL REUS W/TWL LRG LVL3 (GOWN DISPOSABLE) ×12
GOWN STRL REUS W/TWL XL LVL3 (GOWN DISPOSABLE) ×8
GUIDEWIRE 3.2X400 (WIRE) ×2 IMPLANT
K-WIRE 1.6 (WIRE) ×4
K-WIRE FX5X1.6XNS BN SS (WIRE) ×2
KIT BASIN OR (CUSTOM PROCEDURE TRAY) ×4 IMPLANT
KIT TURNOVER KIT B (KITS) ×4 IMPLANT
KWIRE FX5X1.6XNS BN SS (WIRE) IMPLANT
NAIL TROCH FIX 10X235 RT 130 (Nail) ×2 IMPLANT
NS IRRIG 1000ML POUR BTL (IV SOLUTION) ×4 IMPLANT
PACK GENERAL/GYN (CUSTOM PROCEDURE TRAY) ×4 IMPLANT
PAD ARMBOARD 7.5X6 YLW CONV (MISCELLANEOUS) ×10 IMPLANT
PEG LOCKING SMOOTH 2.2X18 (Peg) ×4 IMPLANT
PEG LOCKING SMOOTH 2.2X20 (Screw) ×6 IMPLANT
PEG LOCKING SMOOTH 2.2X22 (Screw) ×2 IMPLANT
PLATE NARROW DVR RIGHT (Plate) ×2 IMPLANT
PUTTY DBM STAGRAFT PLUS 2CC (Putty) ×2 IMPLANT
SCREW LOCK 14X2.7X 3 LD TPR (Screw) IMPLANT
SCREW LOCKING 2.7X13MM (Screw) ×4 IMPLANT
SCREW LOCKING 2.7X14 (Screw) ×12 IMPLANT
SCREW LOCKING 5.0X34MM (Screw) ×2 IMPLANT
SCREW TFNA 90MM HIP (Orthopedic Implant) ×2 IMPLANT
STAPLER VISISTAT 35W (STAPLE) ×4 IMPLANT
SUT MON AB 2-0 CT1 36 (SUTURE) ×4 IMPLANT
TOWEL OR 17X24 6PK STRL BLUE (TOWEL DISPOSABLE) ×4 IMPLANT
TOWEL OR 17X26 10 PK STRL BLUE (TOWEL DISPOSABLE) ×4 IMPLANT

## 2017-09-17 NOTE — Progress Notes (Addendum)
I have just spoken with Kimberly Greer son, Kimberly Greer, we discussed the plan for surgical management of his mother's right hip fracture.  She is on the schedule today for the above surgery.  This be done sometime this afternoon.  My partner Dr. Caralyn Guile is going to review her wrist films to see if that would require operative management as well.  I will speak with the patient sometime prior to surgery this afternoon.

## 2017-09-17 NOTE — Anesthesia Procedure Notes (Signed)
Procedure Name: Intubation Date/Time: 09/10/2017 4:14 PM Performed by: Eligha Bridegroom, CRNA Pre-anesthesia Checklist: Patient identified, Emergency Drugs available, Suction available, Patient being monitored and Timeout performed Patient Re-evaluated:Patient Re-evaluated prior to induction Oxygen Delivery Method: Circle system utilized Preoxygenation: Pre-oxygenation with 100% oxygen Induction Type: IV induction Ventilation: Mask ventilation without difficulty Grade View: Grade II Tube type: Oral Tube size: 7.0 mm Airway Equipment and Method: Stylet Placement Confirmation: ETT inserted through vocal cords under direct vision,  positive ETCO2 and breath sounds checked- equal and bilateral Secured at: 21 cm Tube secured with: Tape Dental Injury: Teeth and Oropharynx as per pre-operative assessment

## 2017-09-17 NOTE — Anesthesia Procedure Notes (Signed)
Anesthesia Regional Block: Supraclavicular block   Pre-Anesthetic Checklist: ,, timeout performed, Correct Patient, Correct Site, Correct Laterality, Correct Procedure,, site marked, risks and benefits discussed, Surgical consent,  Pre-op evaluation,  At surgeon's request and post-op pain management  Laterality: Right  Prep: chloraprep       Needles:  Injection technique: Single-shot  Needle Type: Echogenic Stimulator Needle     Needle Length: 9cm  Needle Gauge: 21     Additional Needles:   Procedures:,,,, ultrasound used (permanent image in chart),,,,  Narrative:  Start time: 09/17/2017 3:25 PM End time: 09/17/2017 3:35 PM Injection made incrementally with aspirations every 5 mL.  Performed by: Personally  Anesthesiologist: Murvin Natal, MD  Additional Notes: Functioning IV was confirmed and monitors were applied.  A 37mm 21ga Arrow echogenic stimulator needle was used. Sterile prep, hand hygiene and sterile gloves were used.  Negative aspiration and negative test dose prior to incremental administration of local anesthetic. The patient tolerated the procedure well.

## 2017-09-17 NOTE — Transfer of Care (Signed)
Immediate Anesthesia Transfer of Care Note  Patient: Kimberly Greer  Procedure(s) Performed: INTRAMEDULLARY (IM) NAIL INTERTROCHANTRIC HIP (Right Hip) OPEN REDUCTION INTERNAL FIXATION (ORIF) DISTAL RADIAL FRACTURE (Right Wrist)  Patient Location: PACU  Anesthesia Type:General  Level of Consciousness: drowsy  Airway & Oxygen Therapy: Patient Spontanous Breathing and Patient connected to face mask oxygen  Post-op Assessment: Report given to RN and Post -op Vital signs reviewed and stable  Post vital signs: Reviewed and stable  Last Vitals:  Vitals Value Taken Time  BP 153/62 09/17/2017  6:44 PM  Temp    Pulse 82 09/17/2017  6:52 PM  Resp 17 09/17/2017  6:52 PM  SpO2 88 % 09/17/2017  6:52 PM  Vitals shown include unvalidated device data.  Last Pain:  Vitals:   09/17/17 1545  TempSrc:   PainSc: 0-No pain      Patients Stated Pain Goal: 0 (97/98/92 1194)  Complications: No apparent anesthesia complications

## 2017-09-17 NOTE — Progress Notes (Signed)
PROGRESS NOTE    Kimberly Greer  JJK:[id number] DOB: 1921-05-13 DOA: 09/15/2017 PCP: Blanchie Serve, MD   Brief Narrative:  Kimberly Greer is Kimberly Greer 84 y.o. female with history of COPD/asthma, hypothyroidism presents to the ER after patient had Kimberly Greer fall.  Patient states she was walking to the bathroom when she slipped and fell.  Denies hitting head or losing consciousness.  She fell onto her right arm.  Since the fall patient has benign pain in the right hip and right wrist.  Assessment & Plan:   Active Problems:   Hypothyroid   Memory loss   PVD (peripheral vascular disease) (HCC)   Hip fracture (HCC)   Displaced intertrochanteric fracture of right femur, initial encounter for closed fracture ([place])   COPD (chronic obstructive pulmonary disease) ([place])   Palliative care by specialist   Goals of care, counseling/discussion   Fall   Closed fracture of right distal radius   1. Right hip fracture and right wrist fracture - patient agreeable to surgery at this point.  Sounds like the plan is for this afternoon. 1. NPO 2. APAP, oxy, morphine for pain, bowel regimen 3. Ortho c/s, planning for surgery this afternoon (Dr. Jacelyn Grip to review wrist films)    2. Syncope:  Described as mechanical fall in H&P, but pt states to me she "passed out" after getting up from using bathroom.  Orthostatic vs reflex syncope possible.  Cardiac etiology also possible with lack of prodrome.  EKG appears c/w priors.  She denies any CP or SOB with exertion.  I don't think echo would meaningfully change our management at this point based on our discussion yesterday and would likely delay surgery unnecessarily.    3. History of asthma continue home inhalers.  4. Hypothyroidism continue synthroid  5. Acute renal failure improved.  holding lasix, follow with IVF  6. LBBB -denies any chest pain, chronic  7. Hyperglycemia -check hemoglobin A1c.  8. Leukocytosis - follow, improved  9. Chronic Pansinusitis: ?  Superimposed chronic fungal infection in R maxillary sinus.  Follow for now.  Afebrile.  10. Anemia: H/H downtrended to 9.8 with IVF, suspect dilutional, follow    DVT prophylaxis: heparin Code Status: DNR Family Communication: none at bedside Disposition Plan: pending further goc discussion and disposition   Consultants:   Ortho  palliative  Procedures:   none  Antimicrobials:  Anti-infectives (From admission, onward)   None     Subjective: Pain ok. Denies CP or SOB. Planning for surgery this afternoon.  Objective: Vitals:   09/16/17 1437 09/16/17 2103 09/17/17 0444 09/17/17 0800  BP: (!) 109/47 (!) 139/47 (!) 135/43   Pulse: (!) 55 (!) 58 (!) 55   Resp: 20 16 16    Temp: 98 F (36.7 C) 98 F (36.7 C) 98.1 F (36.7 C)   TempSrc: Oral Oral Oral   SpO2: 100% 95% 91% 90%  Weight:      Height:        Intake/Output Summary (Last 24 hours) at 09/17/2017 1015 Last data filed at 09/17/2017 0600 Gross per 24 hour  Intake 255 ml  Output 450 ml  Net -195 ml   Filed Weights   09/15/17 1719  Weight: 65 kg    Examination:  General: No acute distress. Cardiovascular: Heart sounds show Kimberly Greer regular rate, and rhythm.  Lungs: Clear to auscultation bilaterally with good air movement.  Abdomen: Soft, nontender, nondistended Neurological: Alert and oriented 3. Moves all extremities 4. Cranial nerves II through XII grossly intact.  Skin: Warm and dry. No rashes or lesions. Extremities: R wrist in dressing, RLE shortened and externally rotated Psychiatric: Mood and affect are normal. Insight and judgment are appropirate.   Data Reviewed: I have personally reviewed following labs and imaging studies  CBC: Recent Labs  Lab 09/15/17 1907 09/16/17 0407 09/17/17 0356  WBC 16.6* 11.2* 10.4  NEUTROABS 14.0* 8.7*  --   HGB 12.7 11.4* 9.8*  HCT 40.3 37.2 31.2*  MCV 93.3 94.7 95.1  PLT 252 251 809   Basic Metabolic Panel: Recent Labs  Lab 09/15/17 1907  09/16/17 0407 09/17/17 0356  NA 139 142 141  K 4.7 5.0 4.0  CL 105 105 110  CO2 23 27 25   GLUCOSE 157* 169* 119*  BUN 18 19 20   CREATININE 1.09* 1.42* 1.08*  CALCIUM 8.7* 8.7* 8.3*  MG  --   --  2.3   GFR: Estimated Creatinine Clearance: 26.1 mL/min (Kimberly Greer) (by C-G formula based on SCr of 1.08 mg/dL (H)). Liver Function Tests: No results for input(s): AST, ALT, ALKPHOS, BILITOT, PROT, ALBUMIN in the last 168 hours. No results for input(s): LIPASE, AMYLASE in the last 168 hours. No results for input(s): AMMONIA in the last 168 hours. Coagulation Profile: Recent Labs  Lab 09/15/17 1907  INR 1.03   Cardiac Enzymes: No results for input(s): CKTOTAL, CKMB, CKMBINDEX, TROPONINI in the last 168 hours. BNP (last 3 results) No results for input(s): PROBNP in the last 8760 hours. HbA1C: Recent Labs    09/16/17 0858  HGBA1C 6.0*   CBG: No results for input(s): GLUCAP in the last 168 hours. Lipid Profile: No results for input(s): CHOL, HDL, LDLCALC, TRIG, CHOLHDL, LDLDIRECT in the last 72 hours. Thyroid Function Tests: No results for input(s): TSH, T4TOTAL, FREET4, T3FREE, THYROIDAB in the last 72 hours. Anemia Panel: No results for input(s): VITAMINB12, FOLATE, FERRITIN, TIBC, IRON, RETICCTPCT in the last 72 hours. Sepsis Labs: No results for input(s): PROCALCITON, LATICACIDVEN in the last 168 hours.  No results found for this or any previous visit (from the past 240 hour(s)).       Radiology Studies: Dg Chest 1 View  Result Date: 09/15/2017 CLINICAL DATA:  Right shoulder pain. EXAM: CHEST  1 VIEW COMPARISON:  04/05/2009 FINDINGS: Hyperexpansion is consistent with emphysema. Cardiopericardial silhouette is at upper limits of normal for size. Bones are diffusely demineralized. Telemetry leads overlie the chest. IMPRESSION: Chronic changes without acute findings. Electronically Signed   By: Misty Stanley M.D.   On: 09/15/2017 19:14   Dg Shoulder Right  Result Date:  09/15/2017 CLINICAL DATA:  Fall, right shoulder pain EXAM: RIGHT SHOULDER - 2+ VIEW COMPARISON:  None. FINDINGS: Degenerative changes in the [facility] joint with joint space narrowing and spurring. Glenohumeral joint is maintained. No acute bony abnormality. Specifically, no fracture, subluxation, or dislocation. Soft tissues are intact. IMPRESSION: No acute bony abnormality. Electronically Signed   By: Rolm Baptise M.D.   On: 09/15/2017 19:14   Dg Wrist Complete Right  Result Date: 09/15/2017 CLINICAL DATA:  Fall with wrist deformity EXAM: RIGHT WRIST - COMPLETE 3+ VIEW COMPARISON:  None. FINDINGS: There is Karess Harner comminuted, predominantly transverse fracture of the distal right radius with moderate lateral displacement. There is also Veora Fonte minimally displaced fracture of the distal left ulna, involving the ulnar styloid. There is severe first carpometacarpal joint osteoarthrosis. No carpal fracture is identified. IMPRESSION: 1. Comminuted fracture of the distal right radius with lateral displacement. 2. Nondisplaced fracture of the distal left ulna. 3. Severe first  CMC joint osteoarthrosis. Electronically Signed   By: Ulyses Jarred M.D.   On: 09/15/2017 20:31   Ct Head Wo Contrast  Result Date: 09/15/2017 CLINICAL DATA:  84 year old who fell in her bathroom earlier today and was unable to get up on her own. It is unclear how long the patient was on the floor. Initial encounter. EXAM: CT HEAD WITHOUT CONTRAST TECHNIQUE: Contiguous axial images were obtained from the base of the skull through the vertex without intravenous contrast. COMPARISON:  11/06/2014, 04/25/2011, 09/11/2006. FINDINGS: Brain: Significant head tilt in the gantry accounts for apparent asymmetry in the cerebral hemispheres. Moderate to severe age appropriate cortical, deep and cerebellar atrophy, unchanged. Severe changes of small vessel disease of the white matter diffusely, unchanged. No mass lesion. No midline shift. No acute hemorrhage or hematoma. No  extra-axial fluid collections. No evidence of acute infarction. Vascular: Moderate BILATERAL carotid siphon atherosclerosis. No hyperdense vessel. Skull: No skull fracture or other focal osseous abnormality involving the skull. Sinuses/Orbits: Near complete opacification of the RIGHT maxillary sinus with hyperdense material in the sinus. Mucosal thickening involving the LEFT maxillary sinus and the LEFT sphenoid sinus. Opacification of multiple BILATERAL ethmoid air cells. RIGHT sphenoid sinus in both frontal sinuses well aerated. BILATERAL mastoid air cells and BILATERAL middle ear cavities well-aerated. Visualized orbits and globes normal in appearance. Other: None. IMPRESSION: 1. No acute intracranial abnormality. 2. Moderate to severe age-appropriate generalized atrophy and severe chronic microvascular ischemic changes of the white matter. 3. Chronic pansinusitis. Query superimposed chronic fungal infection in the RIGHT maxillary sinus. Electronically Signed   By: Evangeline Dakin M.D.   On: 09/15/2017 20:12   Dg Hip Unilat With Pelvis 2-3 Views Right  Result Date: 09/15/2017 CLINICAL DATA:  Fall, right hip pain EXAM: DG HIP (WITH OR WITHOUT PELVIS) 2-3V RIGHT COMPARISON:  None. FINDINGS: Right femoral intertrochanteric fracture noted with varus angulation. Lesser trochanter is displaced. No subluxation or dislocation. SI joints are symmetric and unremarkable. IMPRESSION: Right femoral intertrochanteric fracture with varus angulation and displacement of fracture fragments. Electronically Signed   By: Rolm Baptise M.D.   On: 09/15/2017 19:14        Scheduled Meds: . acetaminophen  1,000 mg Oral Q8H  . escitalopram  10 mg Oral Daily  . famotidine  20 mg Oral Daily  . fluticasone  2 spray Each Nare Daily  . heparin injection (subcutaneous)  5,000 Units Subcutaneous Q8H  . lactose free nutrition  100-237 mL Oral Daily  . levothyroxine  25 mcg Oral QAC breakfast  . mirabegron ER  25 mg Oral QPM  .  mirtazapine  15 mg Oral QPM  . mometasone-formoterol  2 puff Inhalation BID  . montelukast  10 mg Oral QPM  . senna  1 tablet Oral QHS   Continuous Infusions: . sodium chloride 10 mL/hr at 09/17/17 0440     LOS: 2 days    Time spent: over 30 min MDM moderate with multiple chronic illnesses    Fayrene Helper, MD [facility] Pager [phone]  If 7PM-7AM, please contact night-coverage [email or web address] Password [facility] 09/17/2017, 10:15 AM

## 2017-09-17 NOTE — Anesthesia Preprocedure Evaluation (Addendum)
Anesthesia Evaluation  Patient identified by MRN, date of birth, ID band Patient awake    Reviewed: Allergy & Precautions, NPO status , Patient's Chart, lab work & pertinent test results  History of Anesthesia Complications Negative for: history of anesthetic complications  Airway Mallampati: II  TM Distance: >3 FB Neck ROM: Full    Dental  (+) Teeth Intact,    Pulmonary asthma , COPD,    breath sounds clear to auscultation       Cardiovascular + Peripheral Vascular Disease   Rhythm:Regular  ECG: rate 70. Sinus rhythm with marked sinus arrhythmia Left axis deviation   Neuro/Psych PSYCHIATRIC DISORDERS Depression  Neuromuscular disease    GI/Hepatic Neg liver ROS, GERD  Controlled,  Endo/Other  Hypothyroidism   Renal/GU negative Renal ROS     Musculoskeletal  (+) Arthritis ,   Abdominal   Peds  Hematology  (+) anemia ,   Anesthesia Other Findings   Reproductive/Obstetrics                            Anesthesia Physical Anesthesia Plan  ASA: II  Anesthesia Plan: Spinal and Regional   Post-op Pain Management:  Regional for Post-op pain   Induction:   PONV Risk Score and Plan: 2 and Ondansetron, Dexamethasone and Treatment may vary due to age or medical condition  Airway Management Planned: Natural Airway  Additional Equipment:   Intra-op Plan:   Post-operative Plan:   Informed Consent: I have reviewed the patients History and Physical, chart, labs and discussed the procedure including the risks, benefits and alternatives for the proposed anesthesia with the patient or authorized representative who has indicated his/her understanding and acceptance.     Plan Discussed with: CRNA and Surgeon  Anesthesia Plan Comments:        Anesthesia Quick Evaluation

## 2017-09-17 NOTE — H&P (Signed)
H&P update  The surgical history has been reviewed and remains accurate without interval change.  The patient was re-examined and patient's physiologic condition has not changed significantly in the last 30 days. The condition still exists that makes this procedure necessary. The treatment plan remains the same, without new options for care.  No new pharmacological allergies or types of therapy has been initiated that would change the plan or the appropriateness of the plan.  The patient and/or family understand the potential benefits and risks.  I have just discussed the operative plan again with the patient and her son in the preoperative holding area.  She has operative fractures about the right distal radius and right hip.  The plan will be for concomitant fixation under 1 anesthetic.  Dr. Caralyn Guile will be managing her distal radius fracture and I will be addressing the right hip fracture.  I have reviewed the risk and benefits of each of the distal radius fixation and the hip fixation with the patient and her family.  We reviewed the risk specific to each including but not limited to bleeding, infection, damage to surrounding neurovascular structures, painful hardware, malunion, nonunion, need for further surgery, development of blood clots, and the risk of anesthesia.  She has had her son whom is her healthcare power of attorney provide informed consent.  Kwaku Mostafa P. Stann Mainland, MD 09/17/2017 3:10 PM

## 2017-09-17 NOTE — Anesthesia Postprocedure Evaluation (Signed)
Anesthesia Post Note  Patient: AMEIA MORENCY  Procedure(s) Performed: INTRAMEDULLARY (IM) NAIL INTERTROCHANTRIC HIP (Right Hip) OPEN REDUCTION INTERNAL FIXATION (ORIF) DISTAL RADIAL FRACTURE (Right Wrist)     Patient location during evaluation: PACU Anesthesia Type: Regional and General Level of consciousness: awake Pain management: pain level controlled Vital Signs Assessment: post-procedure vital signs reviewed and stable Respiratory status: spontaneous breathing Cardiovascular status: stable Anesthetic complications: no    Last Vitals:  Vitals:   09/17/17 1905 09/17/17 1915  BP: (!) 147/50 (!) 146/60  Pulse: 71 82  Resp: 17 18  Temp:    SpO2: 96% 94%    Last Pain:  Vitals:   09/17/17 1905  TempSrc:   PainSc: Asleep                 Sebastien Jackson

## 2017-09-17 NOTE — Discharge Instructions (Signed)
Orthopedic discharge instructions:  -Okay for full weightbearing as tolerated on the right lower extremity. -Maintain your postoperative bandage for 2 weeks postoperatively until your follow-up appointment with Dr. Stann Mainland. -For pain take Tylenol and/or ibuprofen as needed.  Apply ice to the right lateral hip liberally throughout the day for 20 to 30 minutes at a time. -For the prevention of blood clots take an 81 mg aspirin twice daily for 6 weeks.

## 2017-09-17 NOTE — Op Note (Signed)
.  PREOPERATIVE DIAGNOSIS:Right wrist intra-articular distal radius fracture 3 more fragments  POSTOPERATIVE DIAGNOSIS:Same  ATTENDING SURGEON:Dr. Iran Planas who was scrubbed and present for the entire procedure  ASSISTANT SURGEON Gertie Fey [facility] who was scrubbed and necessary for the entire procedure to help aid in fracture reduction internal fixation and closure and splinting  ANESTHESIA:General  OPERATIVE PROCEDURE:#1: Open treatment of right wrist intra-articular distal radius fracture 3 or more fragments #2: Right wrist brachioradialis tendon release, tendon tenotomy #3: Radiographs 3 views right wrist  IMPLANTS:Biomet narrow short DVR cross lock  RADIOGRAPHIC INTERPRETATION:AP lateral oblique views of the wrist to show the volar plate fixation place in good position  SURGICAL INDICATIONS:Patient is a right-hand dominant female who sustained a closed intra-articular distal radius fracture. Patient seen and evaluated in the hospital and given the nature degree of her hip fracture and her wrist injury with marked displacement is recommended that she undergo the above procedure. Risks of surgery include but not limited to bleeding infection damage to nearby nerves arteries or tendons nonunion malunion hardware failure and need for further surgical intervention.  SURGICAL TECHNIQUE:patient is probably identified in the preoperative holding area marked for permanent marker made on the right wrist indicate the correct operative site. Patient brought back to the operating placed supine on anesthesia and table where the IV sedation was administered. Patient tolerated this well. A well-padded tourniquet was then placed on the right brachium and sealed with the appropriate drape. The right upper extremities and prepped and draped in normal sterile fashion. A timeout was called cracks I was identified and the procedure then begun. Attention was then turned to the right wrist. The  limb was then elevated tourniquet insufflated. A longitudinal incision made directly over the FCR sheath. Dissection carried down through the skin and subcutaneous tissues. The FPL was then identified and the pronator quadratus was then elevated in an L-shaped fashion. The radial column was then carefully exposed. Careful dissection was then carried around around the radial sided reduce the intra-articular fracture. Brachioradialis tendon release was then carried out to release and reduce the radial side. Open reduction was then performed of the intra-articular fracture 3 or more fragments. Open reduction was then performed with good near anatomical alignment. Following this the volar plate was then applied. His held distally with a K wire. Plate position was then confirmed using the mini C-arm. Following this the oblong screw hole was then placed proximally. Plate height was an adjusted. Following this distal fixation was then carried out with a combination of distal locking pegs. After distal fixation screw fixation was then carried out of the shaft shaft fixation combination of locking and nonlocking screws. The wound was then thoroughly irrigated. Final radiographs were then obtained. The pronator quadratus was then closed with 2-0 Vicryl suture. The subcutaneous tissues were then closed with 4-0 Vicryl. Skin was then closed with simple 4-0 Prolene. Adaptic dressing a sterile compressive bandage then applied. She was placed in short arm volar splint. The patient tolerated the procedure well returned recovery room in good condition.   POSTOPERATIVE PLAN: NWB RUE, ok to use platform walker. Seen back in the office in 2 weeks for wound check suture removal x-rays application a short arm cast place the therapy order begin at the four-week mark. Radiographs at each visit. Begin an outpatient therapy regimen at the four-week mark

## 2017-09-17 NOTE — Op Note (Signed)
Date of Surgery: 09/17/2017  INDICATIONS: Kimberly Greer is a 84 y.o.-year-old female who sustained a right hip fracture and right distal radius fracture following a fall at her assisted living facility prior to arrival at the hospital on Monday, August 12.  After lengthy discussion with the patient and her family regarding the merits of operative versus nonoperative management they elected for operative management.  She presents to the preoperative holding area today for operative fixation of both the right distal radius and right intertrochanteric hip fracture.  My partner Dr. Iran Planas is going to manage the right wrist and I will manage the right hip.  The risks and benefits of the procedure discussed with the patient, family and caregiver prior to the procedure and all questions were answered; consent was obtained.  PREOPERATIVE DIAGNOSIS: right hip fracture   POSTOPERATIVE DIAGNOSIS: Same   PROCEDURE: Treatment of intertrochanteric, pertrochanteric, subtrochanteric fracture with intramedullary implant. CPT [phone]   SURGEON: Dannielle Karvonen. Stann Mainland, M.D.   ANESTHESIA: general   IV FLUIDS AND URINE: See anesthesia record   ESTIMATED BLOOD LOSS: 75 cc  IMPLANTS:  Synthes TFN A 10 mm x 235 mm Proximal 90 mm compression screw 5.0 x 58mm distal interlock  DRAINS: None.   COMPLICATIONS: None.   DESCRIPTION OF PROCEDURE: The patient was brought to the operating room and placed supine on the operating table. The patient's leg had been signed prior to the procedure. The patient had the anesthesia placed by the anesthesiologist. The prep verification and incision time-outs were performed to confirm that this was the correct patient, site, side and location. The patient had an SCD on the opposite lower extremity. The patient did receive antibiotics prior to the incision and was re-dosed during the procedure as needed at indicated intervals. The patient was positioned on the fracture table with the  table in traction and internal rotation to reduce the hip. The well leg was placed in a scissor position and all bony prominences were well-padded. The patient had the lower extremity prepped and draped in the standard surgical fashion. The incision was made 4 finger breadths superior to the greater trochanter. A guide pin was inserted into the tip of the greater trochanter under fluoroscopic guidance. An opening reamer was used to gain access to the femoral canal. The nail length was measured and inserted down the femoral canal to its proper depth. The appropriate version of insertion for the lag screw was found under fluoroscopy. A pin was inserted up the femoral neck through the jig. The length of the lag screw was then measured. The lag screw was inserted as near to center-center in the head as possible. The leg was taken out of traction, then the compression screw was used to compress across the fracture. Compression was visualized on serial xrays.   Of note during the placement of the head pin and subsequent scew, an accessory reduction device was used to push the neck out of Varus, once positioned appropriately an antirotation pin was placed to hold this reduction provisionally while the compression screw was placed.  We next turned our attention to the distal interlocking screw.  This was placed through the drill guide of the nail inserter.  A small incision was made overlying the lateral thigh at the screw site, and a tonsil was used to disect down to bone.  A drill pass was made through the jig and across the nail through both cortices.  This was measured, and the appropriate screw was placed under  hand power and found to have good bite.    The wound was copiously irrigated with saline and the subcutaneous layer closed with 2.0 vicryl and the skin was reapproximated with staples. The wounds were cleaned and dried a final time and a sterile dressing was placed. The hip was taken through a range of  motion at the end of the case under fluoroscopic imaging to visualize the approach-withdraw phenomenon and confirm implant length in the head. All counts were correct at the end of the case.   At the conclusion of my procedure the patient was re-prepped and draped for Dr. Caralyn Guile to presume care of her right wrist fracture.  See his op note for detail of that procedure.    POSTOPERATIVE PLAN: The patient will be weight bearing as tolerated and will return in 2 weeks for staple removal and the patient will receive DVT prophylaxis based on other medications, activity level, and risk ratio of bleeding to thrombosis.     Geralynn Rile, MD [facility] [phone] 5:28 PM

## 2017-09-17 NOTE — Brief Op Note (Signed)
09/15/2017 - 09/17/2017  5:25 PM  PATIENT:  Kimberly Greer  84 y.o. female  PRE-OPERATIVE DIAGNOSIS:  Right hip fracture and right radial fracture  POST-OPERATIVE DIAGNOSIS:  Right hip fracture and right radial fracture  PROCEDURE:  Procedure(s): INTRAMEDULLARY (IM) NAIL INTERTROCHANTRIC HIP (Right) OPEN REDUCTION INTERNAL FIXATION (ORIF) DISTAL RADIAL FRACTURE (Right)  SURGEON:  Surgeon(s) and Role: Panel 1:    Nicholes Stairs, MD - Primary Panel 2:    * Iran Planas, MD - Primary  PHYSICIAN ASSISTANT:   ASSISTANTS: none   ANESTHESIA:   general  EBL:  75 cc  BLOOD ADMINISTERED:none  DRAINS: none   LOCAL MEDICATIONS USED:  NONE  SPECIMEN:  No Specimen  DISPOSITION OF SPECIMEN:  N/A  COUNTS:  YES  TOURNIQUET:  * No tourniquets in log *  DICTATION: .Note written in EPIC  PLAN OF CARE: Admit to inpatient   PATIENT DISPOSITION:  PACU - hemodynamically stable.   Delay start of Pharmacological VTE agent (>24hrs) due to surgical blood loss or risk of bleeding: not applicable

## 2017-09-17 NOTE — Consult Note (Signed)
            [facility] CM Primary Care Navigator  09/17/2017  Kimberly Greer 1921-07-19 [id number]   Went to see patient at the bedside to identify possible discharge needs but she is off the unit in the OR for surgery per staff. (Intramedullary (IM) Nail Intertrochanteric right hip;  ORIF-Open Reduction Internal Fixation distal right radial fracture)  Will attempt to see patient at another time when she is available in the room.    Addendum (09/18/17):  Went back to see patient at the bedside to identify discharge needs but therapists are currently working with patient at this time.   Will try to see patient at another time when available in the room.    Addendum (09/19/17):  Went back to see patient in the room and RN reports that son and daughter in-law had left to catch a flight to [place]. Patient currently lives atan assisted living facility ([facility]).    Patientreportsthat she "fell at home" that resulted tothis admission/ surgery.   Patient confirmed that Dr. Blanchie Serve with [facility] is her primary care provider who sees and checks her at the facility.   [facility].  She shared thatcare for her needs is being provided by facility staff which includesadministering of her medications and transportation to her doctors' appointments if needed.   Per RN report, anticipated discharge plan is to return back to [facility] facility level of care when stable and as per therapy recommendation.  Noted nofurtherhealth management needs identified atthis point.  Primary care provider's office is listed as providing transition of care (TOC).   For additional questions please contact:  Edwena Felty A. Kanika Bungert, BSN, RN-BC [facility] PRIMARY CARE Navigator Cell: [phone]

## 2017-09-18 ENCOUNTER — Encounter (HOSPITAL_COMMUNITY): Payer: Self-pay | Admitting: Orthopedic Surgery

## 2017-09-18 LAB — VITAMIN B12: Vitamin B-12: 399 pg/mL (ref 180–914)

## 2017-09-18 LAB — CBC
HEMATOCRIT: 25.7 % — AB (ref 36.0–46.0)
Hemoglobin: 7.9 g/dL — ABNORMAL LOW (ref 12.0–15.0)
MCH: 29.6 pg (ref 26.0–34.0)
MCHC: 30.7 g/dL (ref 30.0–36.0)
MCV: 96.3 fL (ref 78.0–100.0)
PLATELETS: 189 10*3/uL (ref 150–400)
RBC: 2.67 MIL/uL — ABNORMAL LOW (ref 3.87–5.11)
RDW: 14.4 % (ref 11.5–15.5)
WBC: 11.2 10*3/uL — AB (ref 4.0–10.5)

## 2017-09-18 LAB — BASIC METABOLIC PANEL
ANION GAP: 8 (ref 5–15)
BUN: 19 mg/dL (ref 8–23)
CALCIUM: 8 mg/dL — AB (ref 8.9–10.3)
CO2: 25 mmol/L (ref 22–32)
CREATININE: 1.2 mg/dL — AB (ref 0.44–1.00)
Chloride: 108 mmol/L (ref 98–111)
GFR calc Af Amer: 43 mL/min — ABNORMAL LOW (ref >60)
GFR, EST NON AFRICAN AMERICAN: 37 mL/min — AB (ref >60)
GLUCOSE: 140 mg/dL — AB (ref 70–99)
Potassium: 4.6 mmol/L (ref 3.5–5.1)
Sodium: 141 mmol/L (ref 135–145)

## 2017-09-18 LAB — FOLATE: Folate: 13.2 ng/mL (ref >5.9)

## 2017-09-18 LAB — FERRITIN: FERRITIN: 50 ng/mL (ref 11–307)

## 2017-09-18 LAB — IRON AND TIBC
Iron: 12 ug/dL — ABNORMAL LOW (ref 28–170)
Saturation Ratios: 4 % — ABNORMAL LOW (ref 10.4–31.8)
TIBC: 269 ug/dL (ref 250–450)
UIBC: 257 ug/dL

## 2017-09-18 LAB — HEMOGLOBIN AND HEMATOCRIT, BLOOD
HCT: 25.2 % — ABNORMAL LOW (ref 36.0–46.0)
Hemoglobin: 7.6 g/dL — ABNORMAL LOW (ref 12.0–15.0)

## 2017-09-18 MED ORDER — POLYETHYLENE GLYCOL 3350 17 G PO PACK
17.0000 g | PACK | Freq: Every day | ORAL | Status: DC
  Administered 2017-09-18 – 2017-09-21 (×4): 17 g via ORAL
  Filled 2017-09-18 (×4): qty 1

## 2017-09-18 MED ORDER — ASPIRIN EC 81 MG PO TBEC
81.0000 mg | DELAYED_RELEASE_TABLET | Freq: Two times a day (BID) | ORAL | Status: DC
  Administered 2017-09-18 – 2017-09-21 (×6): 81 mg via ORAL
  Filled 2017-09-18 (×6): qty 1

## 2017-09-18 MED ORDER — ENOXAPARIN SODIUM 30 MG/0.3ML ~~LOC~~ SOLN
30.0000 mg | SUBCUTANEOUS | Status: DC
  Filled 2017-09-18: qty 0.3

## 2017-09-18 NOTE — Progress Notes (Addendum)
PT Cancellation Note  Patient Details Name: Kimberly Greer MRN: [id number] DOB: Jan 12, 1922   Cancelled Treatment:    Reason Eval/Treat Not Completed: Active bedrest order. Pt currently with two active bed rest orders in place and conflicting WB'ing statuses (PWB vs WBAT). PT will continue to follow and await updated activity orders and [place] clarification.   [place] 09/18/2017, 7:56 AM

## 2017-09-18 NOTE — Progress Notes (Signed)
   Subjective:  Patient reports pain as mild to moderate.  Denies any numbness or paresthesias in the right upper extremity or right lower extremity.  She is on submental oxygen at this time which she does not use at home.  She denies any chest pain, shortness of breath, or nausea vomiting.  Objective:   VITALS:   Vitals:   09/17/17 2000 09/17/17 2017 09/18/17 0031 09/18/17 0441  BP: (!) 136/45 (!) 151/59 132/60 (!) 137/56  Pulse: 64 (!) 101 (!) 55 (!) 55  Resp: 20 20 18 18   Temp:  98.1 F (36.7 C) 99.1 F (37.3 C) 99.7 F (37.6 C)  TempSrc:  Axillary  Axillary  SpO2: 97% 96% 90% 95%  Weight:      Height:       Right upper extremity: Short arm splint in place.  No obvious drainage noted.  She has 3+ edema in the dorsum of the hand.  Otherwise motor and sensation are intact.  Capillary refill is less than 3 seconds.   Right lower extremity: Dressings clean dry and intact with no drainage.  Neurovascular intact throughout.  Compartments soft.  No pain with passive stretch.   Lab Results  Component Value Date   WBC 11.2 (H) 09/18/2017   HGB 7.9 (L) 09/18/2017   HCT 25.7 (L) 09/18/2017   MCV 96.3 09/18/2017   PLT 189 09/18/2017   BMET    Component Value Date/Time   NA 141 09/18/2017 0427   NA 144 06/02/2017   NA 144 06/02/2017   K 4.6 09/18/2017 0427   K 4.4 06/02/2017   CL 108 09/18/2017 0427   CO2 25 09/18/2017 0427   GLUCOSE 140 (H) 09/18/2017 0427   BUN 19 09/18/2017 0427   BUN 18 06/02/2017   CREATININE 1.20 (H) 09/18/2017 0427   CREATININE 0.97 06/02/2017   CALCIUM 8.0 (L) 09/18/2017 0427   CALCIUM 9.4 06/02/2017   GFRNONAA 37 (L) 09/18/2017 0427   GFRNONAA 50 06/02/2017   GFRAA 43 (L) 09/18/2017 0427     Assessment/Plan: 1 Day Post-Op   Active Problems:   Hypothyroid   Memory loss   PVD (peripheral vascular disease) (HCC)   Hip fracture (HCC)   Displaced intertrochanteric fracture of right femur, initial encounter for closed fracture (HCC)  COPD (chronic obstructive pulmonary disease) (HCC)   Palliative care by specialist   Goals of care, counseling/discussion   Fall   Closed fracture of right distal radius   1.  Right upper extremity, platform weightbearing okay.  Elevate with fingertips above elbow and shoulder if able.  2.  Weightbearing as tolerated okay for the right hip.  Maintain postoperative bandages until follow-up appointment.  Twice daily 81 mg aspirin with SCDs for DVT prophylaxis.   Nicholes Stairs 09/18/2017, 12:43 PM   Geralynn Rile, MD [phone]

## 2017-09-18 NOTE — Progress Notes (Signed)
OT Cancellation Note  Patient Details Name: KRISLYN DONNAN MRN: [id number] DOB: 31-Jul-1921   Cancelled Treatment:    Reason Eval/Treat Not Completed: Patient not medically ready. Pt with active bed rest orders. Awaiting clarification of WB status as well. Will follow.  Malka So 09/18/2017, 8:30 AM  09/18/2017 Nestor Lewandowsky, OTR/L Pager: [phone]

## 2017-09-18 NOTE — Care Management Important Message (Signed)
Important Message  Patient Details  Name: Kimberly Greer MRN: [id number] Date of Birth: [birth year]   Medicare Important Message Given:  Yes    Orbie Pyo 09/18/2017, 4:25 PM

## 2017-09-18 NOTE — Evaluation (Signed)
Occupational Therapy Evaluation Patient Details Name: Kimberly Greer MRN: [id number] DOB: 03-08-1921 Today's Date: 09/18/2017    History of Present Illness Pt is a 84 year old woman from ALF who was admitted 09/15/17 after falling in the bathroom resulting in R hip and R wrist fxs. Pt initially refused sx, but later agreed and underwent ORIF on 8/14 of R wrist and IM nail of R hip. PMH: COPD/asthma, hypothyroidism, PVD, depression, osteoporosis, L proximal humerus fx.   Clinical Impression   Pt typically walks with a rollator and is assisted for meals and showering. She presents with pain, generalized weakness, anxiety and poor sitting balance. She is unable to stand and required +2 max assist to squat pivot to drop arm chair. She requires max to total assist for ADL. Pt will need extensive rehab in SNF prior to return to her ALF at [facility]. Will follow acutely.    Follow Up Recommendations  SNF;Supervision/Assistance - 24 hour    Equipment Recommendations       Recommendations for Other Services       Precautions / Restrictions Precautions Precautions: Fall Restrictions Weight Bearing Restrictions: Yes RUE Weight Bearing: Weight bear through elbow only RLE Weight Bearing: Weight bearing as tolerated      Mobility Bed Mobility Overal bed mobility: Needs Assistance Bed Mobility: Supine to Sit     Supine to sit: +2 for physical assistance;Total assist     General bed mobility comments: assisted with all aspects using bed pad  Transfers Overall transfer level: Needs assistance   Transfers: Squat Pivot Transfers     Squat pivot transfers: +2 physical assistance;Max assist     General transfer comment: pt unable to fully stand with walker or B UE support and use of pad, squat pivoted to drop arm chair with pt taking some weight on her feet    Balance Overall balance assessment: Needs assistance Sitting-balance support: Feet supported;Bilateral upper extremity  supported Sitting balance-Leahy Scale: Poor   Postural control: Posterior lean   Standing balance-Leahy Scale: Zero                             ADL either performed or assessed with clinical judgement   ADL Overall ADL's : Needs assistance/impaired Eating/Feeding: Maximal assistance;Sitting Eating/Feeding Details (indicate cue type and reason): with L hand Grooming: Maximal assistance;Sitting   Upper Body Bathing: Maximal assistance;Sitting   Lower Body Bathing: Total assistance;Bed level   Upper Body Dressing : Maximal assistance;Sitting   Lower Body Dressing: Total assistance;Bed level   Toilet Transfer: +2 for physical assistance;Total assistance;Squat-pivot Toilet Transfer Details (indicate cue type and reason): simulated to chair Toileting- Clothing Manipulation and Hygiene: Total assistance;Bed level       Functional mobility during ADLs: (unable to stand)       Vision Patient Visual Report: No change from baseline       Perception     Praxis      Pertinent Vitals/Pain Pain Assessment: Faces Faces Pain Scale: Hurts even more Pain Location: R hip Pain Descriptors / Indicators: Guarding;Grimacing Pain Intervention(s): Monitored during session;Repositioned     Hand Dominance Right   Extremity/Trunk Assessment Upper Extremity Assessment Upper Extremity Assessment: RUE deficits/detail;LUE deficits/detail RUE Deficits / Details: splinted from MPs to distal of elbow, generalized weakness, instructed pt to move fingers and shoulder as much as possible RUE: Unable to fully assess due to immobilization RUE Coordination: decreased fine motor;decreased gross motor LUE Deficits /  Details: longstanding shoulder limitations from previous fx, tremor LUE Coordination: decreased fine motor;decreased gross motor   Lower Extremity Assessment Lower Extremity Assessment: Defer to PT evaluation       Communication Communication Communication: HOH    Cognition Arousal/Alertness: Awake/alert Behavior During Therapy: Flat affect Overall Cognitive Status: Impaired/Different from baseline Area of Impairment: Following commands;Problem solving                       Following Commands: Follows one step commands with increased time     Problem Solving: Slow processing;Decreased initiation;Difficulty sequencing;Requires verbal cues;Requires tactile cues     General Comments       Exercises     Shoulder Instructions      Home Living Family/patient expects to be discharged to:: Skilled nursing facility                                 Additional Comments: plans to go to rehab at [facility]      Prior Functioning/Environment Level of Independence: Needs assistance  Gait / Transfers Assistance Needed: walked with a rollator ADL's / Homemaking Assistance Needed: assisted for meals, housekeeping and showering            OT Problem List: Decreased strength;Decreased activity tolerance;Impaired balance (sitting and/or standing);Decreased coordination;Decreased knowledge of use of DME or AE;Decreased cognition;Pain      OT Treatment/Interventions: Self-care/ADL training;Therapeutic exercise;DME and/or AE instruction;Therapeutic activities;Patient/family education;Balance training    OT Goals(Current goals can be found in the care plan section) Acute Rehab OT Goals Patient Stated Goal: to get stronger OT Goal Formulation: With patient Time For Goal Achievement: 10/02/17 Potential to Achieve Goals: Good ADL Goals Pt Will Perform Eating: with min assist;sitting Pt Will Perform Grooming: with min assist;sitting Pt Will Perform Upper Body Dressing: with min assist;sitting Pt Will Transfer to Toilet: with +2 assist;with min assist;bedside commode;stand pivot transfer Pt/caregiver will Perform Home Exercise Program: Right Upper extremity;With Supervision(AROM shoulder and digits) Additional ADL Goal #1: Pt  will demonstrate fair sitting balance in preparation for ADL.  OT Frequency: Min 2X/week   Barriers to D/C:            Co-evaluation PT/OT/SLP Co-Evaluation/Treatment: Yes Reason for Co-Treatment: For patient/therapist safety   OT goals addressed during session: ADL's and self-care      AM-PAC PT "6 Clicks" Daily Activity     Outcome Measure Help from another person eating meals?: A Lot Help from another person taking care of personal grooming?: A Lot Help from another person toileting, which includes using toliet, bedpan, or urinal?: Total Help from another person bathing (including washing, rinsing, drying)?: A Lot Help from another person to put on and taking off regular upper body clothing?: A Lot Help from another person to put on and taking off regular lower body clothing?: Total 6 Click Score: 10   End of Session Equipment Utilized During Treatment: Gait belt;Oxygen(2.5 L) Nurse Communication: Mobility status  Activity Tolerance: Patient tolerated treatment well Patient left: in chair;with call bell/phone within reach;with chair alarm set;with family/visitor present  OT Visit Diagnosis: Pain;History of falling (Z91.81);Muscle weakness (generalized) (M62.81)                Time: 1696-7893 OT Time Calculation (min): 62 min Charges:  OT General Charges $OT Visit: 1 Visit OT Evaluation $OT Eval Moderate Complexity: 1 Mod OT Treatments $Self Care/Home Management : 8-22 mins  {Farah Benish,  Haze Boyden 09/18/2017, 4:09 PM  09/18/2017 Nestor Lewandowsky, OTR/L Pager: [phone]

## 2017-09-18 NOTE — Evaluation (Signed)
Physical Therapy Evaluation Patient Details Name: Kimberly Greer MRN: [id number] DOB: 10-21-1921 Today's Date: 09/18/2017   History of Present Illness  Pt is a 84 year old woman from ALF who was admitted 09/15/17 after falling in the bathroom resulting in R hip and R wrist fxs. Pt initially refused sx, but later agreed and underwent ORIF on 8/14 of R wrist and IM nail of R hip. PMH: COPD/asthma, hypothyroidism, PVD, depression, osteoporosis, L proximal humerus fx.  Clinical Impression  Pt presented supine in bed with HOB elevated, awake and willing to participate in therapy session. Prior to admission, pt reported that she was from an ALF where she ambulated with rollator and required assistance with some of her ADLs. Pt currently requires total A x2 for bed mobility and transfers. Pt would continue to benefit from skilled physical therapy services at this time while admitted and after d/c to address the below listed limitations in order to improve overall safety and independence with functional mobility.     Follow Up Recommendations SNF    Equipment Recommendations  None recommended by PT    Recommendations for Other Services       Precautions / Restrictions Precautions Precautions: Fall Restrictions Weight Bearing Restrictions: Yes RUE Weight Bearing: Weight bear through elbow only RLE Weight Bearing: Weight bearing as tolerated      Mobility  Bed Mobility Overal bed mobility: Needs Assistance Bed Mobility: Supine to Sit     Supine to sit: +2 for physical assistance;Total assist     General bed mobility comments: assisted with all aspects using bed pad  Transfers Overall transfer level: Needs assistance Equipment used: 2 person hand held assist(attempted RPRW however unsuccessful) Transfers: Squat Pivot Transfers     Squat pivot transfers: +2 physical assistance;Max assist     General transfer comment: pt unable to fully stand with walker or B UE support and use  of pad, squat pivoted to drop arm chair with pt taking some weight on her feet  Ambulation/Gait                Stairs            Wheelchair Mobility    Modified Rankin (Stroke Patients Only)       Balance Overall balance assessment: Needs assistance Sitting-balance support: Feet supported;Bilateral upper extremity supported;Single extremity supported Sitting balance-Leahy Scale: Poor   Postural control: Posterior lean Standing balance support: Bilateral upper extremity supported Standing balance-Leahy Scale: Zero                               Pertinent Vitals/Pain Pain Assessment: Faces Faces Pain Scale: Hurts even more Pain Location: R hip Pain Descriptors / Indicators: Guarding;Grimacing Pain Intervention(s): Monitored during session;Repositioned    Home Living Family/patient expects to be discharged to:: Skilled nursing facility                 Additional Comments: plans to go to rehab at [facility]    Prior Function Level of Independence: Needs assistance   Gait / Transfers Assistance Needed: walked with a rollator  ADL's / Homemaking Assistance Needed: assisted for meals, housekeeping and showering        Hand Dominance   Dominant Hand: Right    Extremity/Trunk Assessment   Upper Extremity Assessment Upper Extremity Assessment: Defer to OT evaluation RUE Deficits / Details: splinted from MPs to distal of elbow, generalized weakness, instructed pt to move fingers  and shoulder as much as possible RUE: Unable to fully assess due to immobilization RUE Coordination: decreased fine motor;decreased gross motor LUE Deficits / Details: longstanding shoulder limitations from previous fx, tremor LUE Coordination: decreased fine motor;decreased gross motor    Lower Extremity Assessment Lower Extremity Assessment: Generalized weakness;RLE deficits/detail RLE Deficits / Details: pt with decreased strength and ROM limitations  secondary to post-op pain and weakness RLE: Unable to fully assess due to pain       Communication   Communication: HOH  Cognition Arousal/Alertness: Awake/alert Behavior During Therapy: Flat affect Overall Cognitive Status: Impaired/Different from baseline Area of Impairment: Following commands;Problem solving                       Following Commands: Follows one step commands with increased time     Problem Solving: Slow processing;Decreased initiation;Difficulty sequencing;Requires verbal cues;Requires tactile cues        General Comments      Exercises     Assessment/Plan    PT Assessment Patient needs continued PT services  PT Problem List Decreased strength;Decreased range of motion;Decreased activity tolerance;Decreased balance;Decreased mobility;Decreased coordination;Decreased knowledge of use of DME;Decreased safety awareness;Decreased knowledge of precautions;Pain       PT Treatment Interventions DME instruction;Gait training;Stair training;Balance training;Functional mobility training;Therapeutic activities;Therapeutic exercise;Neuromuscular re-education;Patient/family education    PT Goals (Current goals can be found in the Care Plan section)  Acute Rehab PT Goals Patient Stated Goal: to get stronger PT Goal Formulation: With patient Time For Goal Achievement: 10/02/17 Potential to Achieve Goals: Fair    Frequency Min 2X/week   Barriers to discharge        Co-evaluation PT/OT/SLP Co-Evaluation/Treatment: Yes Reason for Co-Treatment: For patient/therapist safety;To address functional/ADL transfers PT goals addressed during session: Mobility/safety with mobility;Balance;Proper use of DME;Strengthening/ROM OT goals addressed during session: ADL's and self-care       AM-PAC PT "6 Clicks" Daily Activity  Outcome Measure Difficulty turning over in bed (including adjusting bedclothes, sheets and blankets)?: Unable Difficulty moving from lying  on back to sitting on the side of the bed? : Unable Difficulty sitting down on and standing up from a chair with arms (e.g., wheelchair, bedside commode, etc,.)?: Unable Help needed moving to and from a bed to chair (including a wheelchair)?: Total Help needed walking in hospital room?: Total Help needed climbing 3-5 steps with a railing? : Total 6 Click Score: 6    End of Session Equipment Utilized During Treatment: Gait belt Activity Tolerance: Patient limited by pain;Patient limited by fatigue Patient left: in chair;with call bell/phone within reach;with chair alarm set;with family/visitor present Nurse Communication: Mobility status PT Visit Diagnosis: Other abnormalities of gait and mobility (R26.89);Pain Pain - Right/Left: Right Pain - part of body: Hip;Arm    Time: 2330-0762 PT Time Calculation (min) (ACUTE ONLY): 39 min   Charges:   PT Evaluation $PT Eval Moderate Complexity: [place], [place], [place]   [place] 09/18/2017, 5:25 PM

## 2017-09-18 NOTE — Progress Notes (Signed)
PROGRESS NOTE    [name]  NOM:[id number] DOB: [birth year] DOA: 09/15/2017 PCP: Blanchie Serve, MD   Brief Narrative:  Kimberly Greer is a 84 y.o. female with history of COPD/asthma, hypothyroidism presents to the ER after patient had a fall.  Patient states she was walking to the bathroom when she slipped and fell.  Denies hitting head or losing consciousness.  She fell onto her right arm.  Since the fall patient has benign pain in the right hip and right wrist.  Assessment & Plan:   Active Problems:   Hypothyroid   Memory loss   PVD (peripheral vascular disease) (HCC)   Hip fracture (HCC)   Displaced intertrochanteric fracture of right femur, initial encounter for closed fracture ([place])   COPD (chronic obstructive pulmonary disease) ([place])   Palliative care by specialist   Goals of care, counseling/discussion   Fall   Closed fracture of right distal radius   1. Right hip fracture and right wrist fracture -  1. APAP, oxy, morphine for pain, bowel regimen.  Pain seems controlled. 2. S/p right hip fracture repair with treatment of intertrochanteric, pertrochanteric, subtrochanteric fracture with intramedullary implant and right wrist fracture with open treatment of R wrist intra articular distal radius fx 3 or more fragments, R wrist brachioradialis tendon release, tendon tenotomy on 8/14 3. 81 ASA BID for DVT ppx    2. Acute Hypoxic Respiratory Failure:  Suspect post op atelectasis.  Wean as tolerated. W/u further if persistent.  3. Syncope:  Described as mechanical fall in H&P, but pt states to me she "passed out" after getting up from using bathroom.  Orthostatic vs reflex syncope possible.  Cardiac etiology also possible with lack of prodrome.  EKG appears c/w priors.  She denies any CP or SOB with exertion.  I don't think echo would meaningfully change our management at this point based on our discussion yesterday and would likely delay surgery unnecessarily.  Pt is  stable.   4. History of asthma continue home inhalers.  Stable, no wheezing.  5. Hypothyroidism continue synthroid, stable  6. Acute renal failure creatine up to 1.2 today, continue IVF  7. LBBB -denies any chest pain, chronic  8. Hyperglycemia -check hemoglobin A1c. 6, prediabetes.  9. Leukocytosis - follow, reactive  10. Chronic Pansinusitis: ? Superimposed chronic fungal infection in R maxillary sinus.  Follow for now.  Afebrile.  11. Anemia: Downtrend post op, suspect ABLA.  Pt hemodynamically stable.  Will check H/H this afternoon.  Transfuse for <7.   DVT prophylaxis: lovenox Code Status: DNR Family Communication: none at bedside Disposition Plan: pending   Consultants:   Ortho  palliative  Procedures:   none  Antimicrobials:  Anti-infectives (From admission, onward)   Start     Dose/Rate Route Frequency Ordered Stop   09/17/17 2200  ceFAZolin (ANCEF) IVPB 1 g/50 mL premix     1 g 100 mL/hr over 30 Minutes Intravenous Every 6 hours 09/17/17 2058 09/18/17 1559   09/17/17 1200  ceFAZolin (ANCEF) IVPB 2g/100 mL premix     2 g 200 mL/hr over 30 Minutes Intravenous To Surgery 09/17/17 1053 09/17/17 1620     Subjective: Pain better. No complaints.  Objective: Vitals:   09/17/17 2000 09/17/17 2017 09/18/17 0031 09/18/17 0441  BP: (!) 136/45 (!) 151/59 132/60 (!) 137/56  Pulse: 64 (!) 101 (!) 55 (!) 55  Resp: 20 20 18 18   Temp:  98.1 F (36.7 C) 99.1 F (37.3 C) 99.7 F (37.6  C)  TempSrc:  Axillary  Axillary  SpO2: 97% 96% 90% 95%  Weight:      Height:        Intake/Output Summary (Last 24 hours) at 09/18/2017 0934 Last data filed at 09/18/2017 0442 Gross per 24 hour  Intake 1050 ml  Output 360 ml  Net 690 ml   Filed Weights   09/15/17 1719 09/17/17 1437  Weight: 65 kg 65 kg    Examination:  General: No acute distress. Cardiovascular: Heart sounds show a regular rate, and rhythm. Lungs: Clear to auscultation bilaterally with good air  movement.  Abdomen: Soft, nontender, nondistended  Neurological: Alert and oriented 3. Moves all extremities 4. Cranial nerves II through XII grossly intact. Skin: Warm and dry. No rashes or lesions. Extremities: R short arm splint, RLE with intact dressing Psychiatric: Mood and affect are normal. Insight and judgment are appropriate.    Data Reviewed: I have personally reviewed following labs and imaging studies  CBC: Recent Labs  Lab 09/15/17 1907 09/16/17 0407 09/17/17 0356 09/18/17 0427  WBC 16.6* 11.2* 10.4 11.2*  NEUTROABS 14.0* 8.7*  --   --   HGB 12.7 11.4* 9.8* 7.9*  HCT 40.3 37.2 31.2* 25.7*  MCV 93.3 94.7 95.1 96.3  PLT 252 251 204 778   Basic Metabolic Panel: Recent Labs  Lab 09/15/17 1907 09/16/17 0407 09/17/17 0356 09/18/17 0427  NA 139 142 141 141  K 4.7 5.0 4.0 4.6  CL 105 105 110 108  CO2 23 27 25 25   GLUCOSE 157* 169* 119* 140*  BUN 18 19 20 19   CREATININE 1.09* 1.42* 1.08* 1.20*  CALCIUM 8.7* 8.7* 8.3* 8.0*  MG  --   --  2.3  --    GFR: Estimated Creatinine Clearance: 23.5 mL/min (A) (by C-G formula based on SCr of 1.2 mg/dL (H)). Liver Function Tests: No results for input(s): AST, ALT, ALKPHOS, BILITOT, PROT, ALBUMIN in the last 168 hours. No results for input(s): LIPASE, AMYLASE in the last 168 hours. No results for input(s): AMMONIA in the last 168 hours. Coagulation Profile: Recent Labs  Lab 09/15/17 1907  INR 1.03   Cardiac Enzymes: No results for input(s): CKTOTAL, CKMB, CKMBINDEX, TROPONINI in the last 168 hours. BNP (last 3 results) No results for input(s): PROBNP in the last 8760 hours. HbA1C: Recent Labs    09/16/17 0858  HGBA1C 6.0*   CBG: No results for input(s): GLUCAP in the last 168 hours. Lipid Profile: No results for input(s): CHOL, HDL, LDLCALC, TRIG, CHOLHDL, LDLDIRECT in the last 72 hours. Thyroid Function Tests: No results for input(s): TSH, T4TOTAL, FREET4, T3FREE, THYROIDAB in the last 72 hours. Anemia  Panel: No results for input(s): VITAMINB12, FOLATE, FERRITIN, TIBC, IRON, RETICCTPCT in the last 72 hours. Sepsis Labs: No results for input(s): PROCALCITON, LATICACIDVEN in the last 168 hours.  Recent Results (from the past 240 hour(s))  MRSA PCR Screening     Status: None   Collection Time: 09/17/17 11:38 AM  Result Value Ref Range Status   MRSA by PCR NEGATIVE NEGATIVE Final    Comment:        The GeneXpert MRSA Assay (FDA approved for NASAL specimens only), is one component of a comprehensive MRSA colonization surveillance program. It is not intended to diagnose MRSA infection nor to guide or monitor treatment for MRSA infections. Performed at [facility], [place] [address], [place], [place] [address]          Radiology Studies: Dg C-arm 1-60  Min  Result Date: 09/17/2017 CLINICAL DATA:  Hip fracture EXAM: DG C-ARM 61-120 MIN; OPERATIVE RIGHT HIP WITH PELVIS COMPARISON:  09/15/2017 FINDINGS: Four low resolution intraoperative spot views of the right hip. Total fluoroscopy time was 55 seconds. The images demonstrate intramedullary rod and screw fixation of the proximal right femur for intertrochanteric fracture. Displaced lesser trochanteric fracture fragment is noted. IMPRESSION: Intraoperative fluoroscopic assistance provided during surgical fixation of right femur fracture Electronically Signed   By: Donavan Foil M.D.   On: 09/17/2017 19:14   Dg Hip Operative Unilat W Or W/o Pelvis Right  Result Date: 09/17/2017 CLINICAL DATA:  Hip fracture EXAM: DG C-ARM 61-120 MIN; OPERATIVE RIGHT HIP WITH PELVIS COMPARISON:  09/15/2017 FINDINGS: Four low resolution intraoperative spot views of the right hip. Total fluoroscopy time was 55 seconds. The images demonstrate intramedullary rod and screw fixation of the proximal right femur for intertrochanteric fracture. Displaced lesser trochanteric fracture fragment is noted. IMPRESSION: Intraoperative fluoroscopic assistance provided  during surgical fixation of right femur fracture Electronically Signed   By: Donavan Foil M.D.   On: 09/17/2017 19:14        Scheduled Meds: . acetaminophen  1,000 mg Oral Q8H  . docusate sodium  100 mg Oral BID  . escitalopram  10 mg Oral Daily  . famotidine  20 mg Oral Daily  . fluticasone  2 spray Each Nare Daily  . lactose free nutrition  100-237 mL Oral Daily  . levothyroxine  25 mcg Oral QAC breakfast  . mirabegron ER  25 mg Oral QPM  . mirtazapine  15 mg Oral QPM  . mometasone-formoterol  2 puff Inhalation BID  . montelukast  10 mg Oral QPM  . senna  1 tablet Oral QHS   Continuous Infusions: . sodium chloride 10 mL/hr at 09/17/17 0440  .  ceFAZolin (ANCEF) IV 1 g (09/18/17 0451)  . lactated ringers 75 mL/hr at 09/17/17 1446  . lactated ringers       LOS: 3 days    Time spent: over 30 min MDM moderate with multiple chronic illnesses    Fayrene Helper, MD [facility] Pager [phone]  If 7PM-7AM, please contact night-coverage [email or web address] Password [facility] 09/18/2017, 9:34 AM

## 2017-09-19 LAB — CBC
HCT: 22.9 % — ABNORMAL LOW (ref 36.0–46.0)
Hemoglobin: 6.9 g/dL — CL (ref 12.0–15.0)
MCH: 29.6 pg (ref 26.0–34.0)
MCHC: 30.1 g/dL (ref 30.0–36.0)
MCV: 98.3 fL (ref 78.0–100.0)
PLATELETS: 192 10*3/uL (ref 150–400)
RBC: 2.33 MIL/uL — ABNORMAL LOW (ref 3.87–5.11)
RDW: 14.5 % (ref 11.5–15.5)
WBC: 9.9 10*3/uL (ref 4.0–10.5)

## 2017-09-19 LAB — BASIC METABOLIC PANEL
Anion gap: 6 (ref 5–15)
BUN: 23 mg/dL (ref 8–23)
CALCIUM: 8.1 mg/dL — AB (ref 8.9–10.3)
CO2: 25 mmol/L (ref 22–32)
Chloride: 111 mmol/L (ref 98–111)
Creatinine, Ser: 0.98 mg/dL (ref 0.44–1.00)
GFR calc Af Amer: 55 mL/min — ABNORMAL LOW (ref >60)
GFR, EST NON AFRICAN AMERICAN: 48 mL/min — AB (ref >60)
Glucose, Bld: 108 mg/dL — ABNORMAL HIGH (ref 70–99)
Potassium: 3.9 mmol/L (ref 3.5–5.1)
Sodium: 142 mmol/L (ref 135–145)

## 2017-09-19 LAB — HEMOGLOBIN AND HEMATOCRIT, BLOOD
HCT: 29.8 % — ABNORMAL LOW (ref 36.0–46.0)
Hemoglobin: 9.3 g/dL — ABNORMAL LOW (ref 12.0–15.0)

## 2017-09-19 LAB — PREPARE RBC (CROSSMATCH)

## 2017-09-19 LAB — MAGNESIUM: Magnesium: 2.3 mg/dL (ref 1.7–2.4)

## 2017-09-19 MED ORDER — SODIUM CHLORIDE 0.9% IV SOLUTION
Freq: Once | INTRAVENOUS | Status: AC
  Administered 2017-09-19: 11:00:00 via INTRAVENOUS

## 2017-09-19 MED ORDER — FERROUS SULFATE 325 (65 FE) MG PO TABS
325.0000 mg | ORAL_TABLET | Freq: Every day | ORAL | Status: DC
  Administered 2017-09-20 – 2017-09-21 (×2): 325 mg via ORAL
  Filled 2017-09-19 (×2): qty 1

## 2017-09-19 NOTE — Progress Notes (Signed)
PROGRESS NOTE    Kimberly Greer  TKZ:[id number] DOB: 05/28/1921 DOA: 09/15/2017 PCP: Blanchie Serve, MD   Brief Narrative:  Kimberly Greer is a 84 y.o. female with history of COPD/asthma, hypothyroidism presents to the ER after patient had a fall.  Patient states she was walking to the bathroom when she slipped and fell.  Denies hitting head or losing consciousness.  She fell onto her right arm.  Since the fall patient has benign pain in the right hip and right wrist.  Assessment & Plan:   Active Problems:   Hypothyroid   Memory loss   PVD (peripheral vascular disease) (HCC)   Hip fracture (HCC)   Displaced intertrochanteric fracture of right femur, initial encounter for closed fracture ([place])   COPD (chronic obstructive pulmonary disease) ([place])   Palliative care by specialist   Goals of care, counseling/discussion   Fall   Closed fracture of right distal radius   1. Right hip fracture and right wrist fracture -  1. APAP, oxy, morphine for pain, bowel regimen.  Pain seems controlled. 2. S/p right hip fracture repair with treatment of intertrochanteric, pertrochanteric, subtrochanteric fracture with intramedullary implant and right wrist fracture with open treatment of R wrist intra articular distal radius fx 3 or more fragments, R wrist brachioradialis tendon release, tendon tenotomy on 8/14 3. 81 ASA BID for DVT ppx 4. Mobilize    2. Anemia: Downtrend post op, suspect ABLA.  6.9 today.  Pt hemodynamically stable.  Iron for iron deficiency anemia.  1. Transfuse 1 unit pRBC  3. Acute Hypoxic Respiratory Failure:  Suspect post op atelectasis.  Now weaned to RA.  4. Syncope:  Described as mechanical fall in H&P, but pt states to me she "passed out" after getting up from using bathroom.  Orthostatic vs reflex syncope possible.  Cardiac etiology also possible with lack of prodrome.  EKG appears c/w priors.  She denies any CP or SOB with exertion.  I don't think echo would  meaningfully change our management at this point based on our discussion yesterday and would likely delay surgery unnecessarily.  Pt is stable.   5. History of asthma continue home inhalers.  Stable, no wheezing.  6. Hypothyroidism continue synthroid, stable  7. Acute renal failure creatine improved  8. LBBB -denies any chest pain, chronic  9. Hyperglycemia -check hemoglobin A1c. 6, prediabetes.  10. Leukocytosis - follow, reactive  11. Chronic Pansinusitis: ? Superimposed chronic fungal infection in R maxillary sinus.  Follow for now.  Afebrile.    DVT prophylaxis: lovenox Code Status: DNR Family Communication: none at bedside Disposition Plan: pending   Consultants:   Ortho  palliative  Procedures:   none  Antimicrobials:  Anti-infectives (From admission, onward)   Start     Dose/Rate Route Frequency Ordered Stop   09/17/17 2200  ceFAZolin (ANCEF) IVPB 1 g/50 mL premix     1 g 100 mL/hr over 30 Minutes Intravenous Every 6 hours 09/17/17 2058 09/18/17 1054   09/17/17 1200  ceFAZolin (ANCEF) IVPB 2g/100 mL premix     2 g 200 mL/hr over 30 Minutes Intravenous To Surgery 09/17/17 1053 09/17/17 1620     Subjective: Pain better. No complaints.  Objective: Vitals:   09/19/17 1045 09/19/17 1100 09/19/17 1130 09/19/17 1407  BP: (!) 116/44  (!) 114/45 (!) 127/39  Pulse: (!) 39 77 84 84  Resp:   19 17  Temp: 98.4 F (36.9 C)  97.8 F (36.6 C) 97.8 F (36.6 C)  TempSrc: Oral  Oral Axillary  SpO2: 99%  92% 90%  Weight:      Height:        Intake/Output Summary (Last 24 hours) at 09/19/2017 1653 Last data filed at 09/19/2017 1527 Gross per 24 hour  Intake 3803.19 ml  Output 500 ml  Net 3303.19 ml   Filed Weights   09/15/17 1719 09/17/17 1437  Weight: 65 kg 65 kg    Examination:  General: No acute distress. Cardiovascular: Heart sounds show a regular rate, and rhythm. Lungs: Clear to auscultation bilaterally with good air movement.  Abdomen: Soft,  nontender, nondistended  Neurological: Alert and oriented 3. Moves all extremities 4. Cranial nerves II through XII grossly intact. Skin: Warm and dry. No rashes or lesions. Extremities: R short arm splint, RLE with intact dressing Psychiatric: Mood and affect are normal. Insight and judgment are appropriate.    Data Reviewed: I have personally reviewed following labs and imaging studies  CBC: Recent Labs  Lab 09/15/17 1907 09/16/17 0407 09/17/17 0356 09/18/17 0427 09/18/17 1556 09/19/17 0655  WBC 16.6* 11.2* 10.4 11.2*  --  9.9  NEUTROABS 14.0* 8.7*  --   --   --   --   HGB 12.7 11.4* 9.8* 7.9* 7.6* 6.9*  HCT 40.3 37.2 31.2* 25.7* 25.2* 22.9*  MCV 93.3 94.7 95.1 96.3  --  98.3  PLT 252 251 204 189  --  681   Basic Metabolic Panel: Recent Labs  Lab 09/15/17 1907 09/16/17 0407 09/17/17 0356 09/18/17 0427 09/19/17 0655  NA 139 142 141 141 142  K 4.7 5.0 4.0 4.6 3.9  CL 105 105 110 108 111  CO2 23 27 25 25 25   GLUCOSE 157* 169* 119* 140* 108*  BUN 18 19 20 19 23   CREATININE 1.09* 1.42* 1.08* 1.20* 0.98  CALCIUM 8.7* 8.7* 8.3* 8.0* 8.1*  MG  --   --  2.3  --  2.3   GFR: Estimated Creatinine Clearance: 28.8 mL/min (by C-G formula based on SCr of 0.98 mg/dL). Liver Function Tests: No results for input(s): AST, ALT, ALKPHOS, BILITOT, PROT, ALBUMIN in the last 168 hours. No results for input(s): LIPASE, AMYLASE in the last 168 hours. No results for input(s): AMMONIA in the last 168 hours. Coagulation Profile: Recent Labs  Lab 09/15/17 1907  INR 1.03   Cardiac Enzymes: No results for input(s): CKTOTAL, CKMB, CKMBINDEX, TROPONINI in the last 168 hours. BNP (last 3 results) No results for input(s): PROBNP in the last 8760 hours. HbA1C: No results for input(s): HGBA1C in the last 72 hours. CBG: No results for input(s): GLUCAP in the last 168 hours. Lipid Profile: No results for input(s): CHOL, HDL, LDLCALC, TRIG, CHOLHDL, LDLDIRECT in the last 72 hours. Thyroid  Function Tests: No results for input(s): TSH, T4TOTAL, FREET4, T3FREE, THYROIDAB in the last 72 hours. Anemia Panel: Recent Labs    09/18/17 1823  VITAMINB12 399  FOLATE 13.2  FERRITIN 50  TIBC 269  IRON 12*   Sepsis Labs: No results for input(s): PROCALCITON, LATICACIDVEN in the last 168 hours.  Recent Results (from the past 240 hour(s))  MRSA PCR Screening     Status: None   Collection Time: 09/17/17 11:38 AM  Result Value Ref Range Status   MRSA by PCR NEGATIVE NEGATIVE Final    Comment:        The GeneXpert MRSA Assay (FDA approved for NASAL specimens only), is one component of a comprehensive MRSA colonization surveillance program. It is not intended  to diagnose MRSA infection nor to guide or monitor treatment for MRSA infections. Performed at [facility], [place] [address], [place], [place] [address]          Radiology Studies: Dg C-arm 1-60 Min  Result Date: 09/17/2017 CLINICAL DATA:  Hip fracture EXAM: DG C-ARM 61-120 MIN; OPERATIVE RIGHT HIP WITH PELVIS COMPARISON:  09/15/2017 FINDINGS: Four low resolution intraoperative spot views of the right hip. Total fluoroscopy time was 55 seconds. The images demonstrate intramedullary rod and screw fixation of the proximal right femur for intertrochanteric fracture. Displaced lesser trochanteric fracture fragment is noted. IMPRESSION: Intraoperative fluoroscopic assistance provided during surgical fixation of right femur fracture Electronically Signed   By: Donavan Foil M.D.   On: 09/17/2017 19:14   Dg Hip Operative Unilat W Or W/o Pelvis Right  Result Date: 09/17/2017 CLINICAL DATA:  Hip fracture EXAM: DG C-ARM 61-120 MIN; OPERATIVE RIGHT HIP WITH PELVIS COMPARISON:  09/15/2017 FINDINGS: Four low resolution intraoperative spot views of the right hip. Total fluoroscopy time was 55 seconds. The images demonstrate intramedullary rod and screw fixation of the proximal right femur for intertrochanteric fracture. Displaced  lesser trochanteric fracture fragment is noted. IMPRESSION: Intraoperative fluoroscopic assistance provided during surgical fixation of right femur fracture Electronically Signed   By: Donavan Foil M.D.   On: 09/17/2017 19:14        Scheduled Meds: . acetaminophen  1,000 mg Oral Q8H  . aspirin EC  81 mg Oral BID  . docusate sodium  100 mg Oral BID  . escitalopram  10 mg Oral Daily  . famotidine  20 mg Oral Daily  . [START ON 09/20/2017] ferrous sulfate  325 mg Oral Q breakfast  . fluticasone  2 spray Each Nare Daily  . lactose free nutrition  100-237 mL Oral Daily  . levothyroxine  25 mcg Oral QAC breakfast  . mirabegron ER  25 mg Oral QPM  . mirtazapine  15 mg Oral QPM  . mometasone-formoterol  2 puff Inhalation BID  . montelukast  10 mg Oral QPM  . polyethylene glycol  17 g Oral Daily  . senna  1 tablet Oral QHS   Continuous Infusions: . sodium chloride Stopped (09/17/17 1352)  . lactated ringers       LOS: 4 days    Time spent: over 30 min MDM high with ABLA requiring transfusion    Fayrene Helper, MD [facility] Pager [phone]  If 7PM-7AM, please contact night-coverage [email or web address] Password [facility] 09/19/2017, 4:53 PM

## 2017-09-19 NOTE — Plan of Care (Signed)
  Problem: Activity: Goal: Ability to ambulate and perform ADLs will improve Outcome: Not Progressing  Pt requires maximum assistance  Problem: Clinical Measurements: Goal: Postoperative complications will be avoided or minimized Outcome: Progressing   Problem: Pain Management: Goal: Pain level will decrease Outcome: Progressing

## 2017-09-19 NOTE — Plan of Care (Signed)
  Problem: Pain Management: Goal: Pain level will decrease Outcome: Progressing   Problem: Clinical Measurements: Goal: Will remain free from infection Outcome: Progressing   Problem: Elimination: Goal: Will not experience complications related to bowel motility Outcome: Progressing   Problem: Safety: Goal: Ability to remain free from injury will improve Outcome: Progressing   Problem: Skin Integrity: Goal: Risk for impaired skin integrity will decrease Outcome: Progressing

## 2017-09-19 NOTE — Progress Notes (Signed)
CRITICAL VALUE ALERT  Critical Value hgb 6.9  Date & Time Notied: 09/19/17  Provider Notified: Florene Glen  Orders Received/Actions taken:1 unit prbc

## 2017-09-19 NOTE — NC FL2 (Addendum)
[place] LEVEL OF CARE SCREENING TOOL     IDENTIFICATION  Patient Name: Kimberly Greer Birthdate: 1922-01-08 Sex: female Admission Date (Current Location): 09/15/2017  [facility] and [place] Number:  Herbalist and Address:  [facility], [place] [address] [place], [place] [address]      Provider Number: [id number]  Attending Physician Name and Address:  Elodia Florence., *  Relative Name and Phone Number:       Current Level of Care: Hospital Recommended Level of Care: [place] Prior Approval Number:    Date Approved/Denied:   PASRR Number: [id number]  Discharge Plan: SNF([facility])    Current Diagnoses: Patient Active Problem List   Diagnosis Date Noted  . Palliative care by specialist   . Goals of care, counseling/discussion   . Fall   . Closed fracture of right distal radius   . Hip fracture ([place]) 09/15/2017  . Displaced intertrochanteric fracture of right femur, initial encounter for closed fracture ([place]) 09/15/2017  . COPD (chronic obstructive pulmonary disease) ([place]) 09/15/2017  . PVD (peripheral vascular disease) ([place]) 08/25/2017  . Depression, recurrent ([place]) 05/29/2017  . Sinus bradycardia 08/26/2016  . Major depression, chronic 08/26/2016  . Adult failure to thrive 05/10/2016  . Dysuria 03/11/2016  . Bilateral knee pain 04/25/2015  . Situational depression 01/06/2015  . Edema 01/03/2015  . GERD (gastroesophageal reflux disease) 11/25/2014  . Osteoporosis 11/25/2014  . Abnormality of gait 03/08/2014  . Trigger finger, acquired 03/08/2014  . Dyspnea 02/09/2013  . Intrinsic asthma 02/09/2013  . Memory loss 06/23/2012  . Hypothyroid   . Hyperglycemia 06/19/2010  . Hyperlipemia 10/24/2009  . Sensorineural hearing loss 02/21/2009  . Urinary incontinence 02/21/2009  . OAB (overactive bladder) 02/16/2009  . Osteoarthritis 02/16/2009    Orientation RESPIRATION BLADDER Height  & Weight     Self, Time, Situation, Place  O2(Nasal Cannula 3L) Indwelling catheter, Incontinent([place] Cath placed 8/14. Pt will d/c with catheter due to urinary retention.) Weight: 143 lb 4.8 oz (65 kg) Height:  4' 11.02" (149.9 cm)  BEHAVIORAL SYMPTOMS/MOOD NEUROLOGICAL BOWEL NUTRITION STATUS      Continent Diet(Heart healthy, thin liquids)  AMBULATORY STATUS COMMUNICATION OF NEEDS Skin   Extensive Assist Verbally Surgical wounds(Closed incision right hip, guaze dressing. Closed incision right wrist, compression wrapped. )                       Personal Care Assistance Level of Assistance  Bathing, Feeding, Dressing Bathing Assistance: Maximum assistance Feeding assistance: Independent Dressing Assistance: Maximum assistance     Functional Limitations Info  Sight, Hearing, Speech Sight Info: Adequate Hearing Info: Adequate Speech Info: Adequate    SPECIAL CARE FACTORS FREQUENCY  PT (By licensed PT), OT (By licensed OT)     PT Frequency: 5x OT Frequency: 5x            Contractures Contractures Info: Not present    Additional Factors Info  Code Status, Allergies Code Status Info: DNR Allergies Info: Sulfa Antibiotics, Amoxicillin, Avelox Moxifloxacin Hcl In Nacl, Doxycycline, Hista-tabs Triprolidine-pse, Pyrilamine-phenylephrine, Septra Sulfamethoxazole-trimethoprim           Current Medications (09/19/2017):  This is the current hospital active medication list Current Facility-Administered Medications  Medication Dose Route Frequency Provider Last Rate Last Dose  . 0.9 %  sodium chloride infusion (Manually program via Guardrails IV Fluids)   Intravenous Once Elodia Florence., MD      .  0.9 %  sodium chloride infusion   Intravenous Continuous Nicholes Stairs, MD   Stopped at 09/17/17 1352  . acetaminophen (TYLENOL) tablet 1,000 mg  1,000 mg Oral Q8H Nicholes Stairs, MD   1,000 mg at 09/19/17 0523  . albuterol (PROVENTIL) (2.5 MG/3ML) 0.083%  nebulizer solution 2.5 mg  2.5 mg Nebulization Q6H PRN Nicholes Stairs, MD      . aspirin EC tablet 81 mg  81 mg Oral BID Elodia Florence., MD   81 mg at 09/18/17 2046  . docusate sodium (COLACE) capsule 100 mg  100 mg Oral BID Nicholes Stairs, MD   100 mg at 09/18/17 2046  . escitalopram (LEXAPRO) tablet 10 mg  10 mg Oral Daily Nicholes Stairs, MD   10 mg at 09/18/17 1025  . famotidine (PEPCID) tablet 20 mg  20 mg Oral Daily Nicholes Stairs, MD   20 mg at 09/18/17 1025  . fluticasone (FLONASE) 50 MCG/ACT nasal spray 2 spray  2 spray Each Nare Daily Nicholes Stairs, MD   2 spray at 09/18/17 1025  . ipratropium (ATROVENT) nebulizer solution 0.5 mg  0.5 mg Nebulization Q6H PRN Nicholes Stairs, MD      . lactated ringers infusion   Intravenous Continuous Nicholes Stairs, MD      . lactose free nutrition (BOOST PLUS) liquid 100-237 mL  100-237 mL Oral Daily Nicholes Stairs, MD      . levothyroxine (SYNTHROID, LEVOTHROID) tablet 25 mcg  25 mcg Oral QAC breakfast Nicholes Stairs, MD   25 mcg at 09/19/17 [phone]  . metoCLOPramide (REGLAN) tablet 5-10 mg  5-10 mg Oral Q8H PRN Nicholes Stairs, MD       Or  . metoCLOPramide [facility]) injection 5-10 mg  5-10 mg Intravenous Q8H PRN Nicholes Stairs, MD      . mirabegron ER [facility]) tablet 25 mg  25 mg Oral QPM Nicholes Stairs, MD   25 mg at 09/18/17 1800  . mirtazapine (REMERON) tablet 15 mg  15 mg Oral QPM Nicholes Stairs, MD   15 mg at 09/18/17 1800  . mometasone-formoterol (DULERA) 200-5 MCG/ACT inhaler 2 puff  2 puff Inhalation BID Nicholes Stairs, MD   2 puff at 09/19/17 0809  . montelukast (SINGULAIR) tablet 10 mg  10 mg Oral QPM Nicholes Stairs, MD   10 mg at 09/18/17 1800  . morphine 2 MG/ML injection 0.5 mg  0.5 mg Intravenous Q2H PRN Nicholes Stairs, MD   0.5 mg at 09/17/17 1131  . ondansetron (ZOFRAN) tablet 4 mg  4 mg Oral Q6H PRN Nicholes Stairs, MD       Or  . ondansetron [facility]) injection 4 mg  4 mg Intravenous Q6H PRN Nicholes Stairs, MD      . oxyCODONE (Oxy IR/ROXICODONE) immediate release tablet 2.5 mg  2.5 mg Oral Q4H PRN Nicholes Stairs, MD       Or  . oxyCODONE (Oxy IR/ROXICODONE) immediate release tablet 5 mg  5 mg Oral Q4H PRN Nicholes Stairs, MD   5 mg at 09/16/17 1732  . polyethylene glycol (MIRALAX / GLYCOLAX) packet 17 g  17 g Oral Daily Elodia Florence., MD   17 g at 09/18/17 1028  . senna (SENOKOT) tablet 8.6 mg  1 tablet Oral QHS Nicholes Stairs, MD   8.6 mg at 09/18/17 2046     Discharge Medications: Please see  discharge summary for a list of discharge medications.  Relevant Imaging Results:  Relevant Lab Results:   Additional Information SSN: [id number]  Eileen Stanford, LCSW

## 2017-09-20 LAB — TYPE AND SCREEN
ABO/RH(D): A POS
Antibody Screen: NEGATIVE
Unit division: 0

## 2017-09-20 LAB — CBC
HEMATOCRIT: 26.7 % — AB (ref 36.0–46.0)
HEMOGLOBIN: 8.4 g/dL — AB (ref 12.0–15.0)
MCH: 30.3 pg (ref 26.0–34.0)
MCHC: 31.5 g/dL (ref 30.0–36.0)
MCV: 96.4 fL (ref 78.0–100.0)
Platelets: 210 10*3/uL (ref 150–400)
RBC: 2.77 MIL/uL — ABNORMAL LOW (ref 3.87–5.11)
RDW: 15 % (ref 11.5–15.5)
WBC: 9.1 10*3/uL (ref 4.0–10.5)

## 2017-09-20 LAB — BPAM RBC
Blood Product Expiration Date: 201909112359
ISSUE DATE / TIME: 201908161055
Unit Type and Rh: 6200

## 2017-09-20 LAB — BASIC METABOLIC PANEL
Anion gap: 6 (ref 5–15)
BUN: 23 mg/dL (ref 8–23)
CALCIUM: 8.1 mg/dL — AB (ref 8.9–10.3)
CHLORIDE: 112 mmol/L — AB (ref 98–111)
CO2: 25 mmol/L (ref 22–32)
CREATININE: 0.81 mg/dL (ref 0.44–1.00)
GFR calc non Af Amer: >60 mL/min (ref >60)
Glucose, Bld: 129 mg/dL — ABNORMAL HIGH (ref 70–99)
Potassium: 4.3 mmol/L (ref 3.5–5.1)
SODIUM: 143 mmol/L (ref 135–145)

## 2017-09-20 LAB — MAGNESIUM: Magnesium: 2.3 mg/dL (ref 1.7–2.4)

## 2017-09-20 NOTE — Progress Notes (Signed)
PROGRESS NOTE    Kimberly Greer  KXF:[id number] DOB: Oct 20, 1921 DOA: 09/15/2017 PCP: Blanchie Serve, MD   Brief Narrative:  Kimberly Greer is [place] 84 y.o. female with history of COPD/asthma, hypothyroidism presents to the ER after patient had Kimberly Greer fall.  Patient states she was walking to the bathroom when she slipped and fell.  Denies hitting head or losing consciousness.  She fell onto her right arm.  Since the fall patient has benign pain in the right hip and right wrist.  Assessment & Plan:   Active Problems:   Hypothyroid   Memory loss   PVD (peripheral vascular disease) (HCC)   Hip fracture (HCC)   Displaced intertrochanteric fracture of right femur, initial encounter for closed fracture ([place])   COPD (chronic obstructive pulmonary disease) ([place])   Palliative care by specialist   Goals of care, counseling/discussion   Fall   Closed fracture of right distal radius   1. Right hip fracture and right wrist fracture -  1. APAP, oxy, morphine for pain, bowel regimen.  Pain seems controlled. 2. S/p right hip fracture repair with treatment of intertrochanteric, pertrochanteric, subtrochanteric fracture with intramedullary implant and right wrist fracture with open treatment of R wrist intra articular distal radius fx 3 or more fragments, R wrist brachioradialis tendon release, tendon tenotomy on 8/14 3. 81 ASA BID for DVT ppx 4. Mobilize 5. Likely d/c to SNF tomorrow if stable    2. Anemia: Downtrend post op, suspect ABLA.  Pt hemodynamically stable.  Iron for iron deficiency anemia.  1. Transfuse 1 unit pRBC 2. 6.9 to 9.3 post transfusion.  8.4 today, follow.  3. Acute Hypoxic Respiratory Failure:  Suspect post op atelectasis.  Wean to room air.  Intermittently on about 2 L.  4. Syncope:  Described as mechanical fall in H&P, but pt states to me she "passed out" after getting up from using bathroom.  Orthostatic vs reflex syncope possible.  Cardiac etiology also possible with lack  of prodrome.  EKG appears c/w priors.  She denies any CP or SOB with exertion.  I don't think echo would meaningfully change our management at this point based on our discussion yesterday and would likely delay surgery unnecessarily.  Pt is stable.   5. History of asthma continue home inhalers.  Stable, no wheezing.  6. Hypothyroidism continue synthroid, stable  7. Acute renal failure creatine improved  8. LBBB -denies any chest pain, chronic  9. Hyperglycemia -check hemoglobin A1c. 6, prediabetes.  10. Leukocytosis - follow, reactive.  Improved.  11. Chronic Pansinusitis: ? Superimposed chronic fungal infection in R maxillary sinus.  Follow for now.  Afebrile.  Will touch base with ID prior to d/c, but low suspicion any intervention needed.  12. Decreased appetite: noticed today.  Daughter in law notes she does best with ensure and ice cream, will encourage this.  Continue to monitor and follow post op.    DVT prophylaxis: lovenox Code Status: DNR Family Communication: Discussion with daughter in law and son about plan.  DIL noted she really wanted to make sure she got OOB today and to chair.  Also wanted [place] to know about ensure and ice cream as well.  Kimberly Greer bit concerned about her going to SNF on weekend, but discussed will reeval tomorrow (at this time, planning for d/c to SNF if stable). Disposition Plan: pending   Consultants:   Ortho  palliative  Procedures:   none  Antimicrobials:  Anti-infectives (From admission, onward)   Start  Dose/Rate Route Frequency Ordered Stop   09/17/17 2200  ceFAZolin (ANCEF) IVPB 1 g/50 mL premix     1 g 100 mL/hr over 30 Minutes Intravenous Every 6 hours 09/17/17 2058 09/18/17 1054   09/17/17 1200  ceFAZolin (ANCEF) IVPB 2g/100 mL premix     2 g 200 mL/hr over 30 Minutes Intravenous To Surgery 09/17/17 1053 09/17/17 1620     Subjective: More awake this morning. Denies any pain.  Some decreased appetitie.  Objective: Vitals:    09/19/17 2015 09/20/17 0406 09/20/17 0846 09/20/17 1431  BP:  (!) 147/50  (!) 118/49  Pulse:  63  64  Resp:  14    Temp:  (!) 97.4 F (36.3 C)  99.4 F (37.4 C)  TempSrc:  Oral  Oral  SpO2: 90% 100% 96% 95%  Weight:      Height:        Intake/Output Summary (Last 24 hours) at 09/20/2017 1723 Last data filed at 09/20/2017 0551 Gross per 24 hour  Intake -  Output 550 ml  Net -550 ml   Filed Weights   09/15/17 1719 09/17/17 1437  Weight: 65 kg 65 kg    Examination:  General: No acute distress. Cardiovascular: Heart sounds show Kimberly Greer regular rate, and rhythm.  Lungs: Clear to auscultation bilaterally with good air movement. Abdomen: Soft, nontender, nondistended Neurological: Alert and oriented 3. Moves all extremities 4. Cranial nerves II through XII grossly intact. Skin: Warm and dry. No rashes or lesions. Extremities: No clubbing or cyanosis. No edema.  Short arm splint to R arm, dressing to RLE.   Data Reviewed: I have personally reviewed following labs and imaging studies  CBC: Recent Labs  Lab 09/15/17 1907 09/16/17 0407 09/17/17 0356 09/18/17 0427 09/18/17 1556 09/19/17 0655 09/19/17 1611 09/20/17 0330  WBC 16.6* 11.2* 10.4 11.2*  --  9.9  --  9.1  NEUTROABS 14.0* 8.7*  --   --   --   --   --   --   HGB 12.7 11.4* 9.8* 7.9* 7.6* 6.9* 9.3* 8.4*  HCT 40.3 37.2 31.2* 25.7* 25.2* 22.9* 29.8* 26.7*  MCV 93.3 94.7 95.1 96.3  --  98.3  --  96.4  PLT 252 251 204 189  --  192  --  595   Basic Metabolic Panel: Recent Labs  Lab 09/16/17 0407 09/17/17 0356 09/18/17 0427 09/19/17 0655 09/20/17 0330  NA 142 141 141 142 143  K 5.0 4.0 4.6 3.9 4.3  CL 105 110 108 111 112*  CO2 27 25 25 25 25   GLUCOSE 169* 119* 140* 108* 129*  BUN 19 20 19 23 23   CREATININE 1.42* 1.08* 1.20* 0.98 0.81  CALCIUM 8.7* 8.3* 8.0* 8.1* 8.1*  MG  --  2.3  --  2.3 2.3   GFR: Estimated Creatinine Clearance: 34.8 mL/min (by C-G formula based on SCr of 0.81 mg/dL). Liver Function  Tests: No results for input(s): AST, ALT, ALKPHOS, BILITOT, PROT, ALBUMIN in the last 168 hours. No results for input(s): LIPASE, AMYLASE in the last 168 hours. No results for input(s): AMMONIA in the last 168 hours. Coagulation Profile: Recent Labs  Lab 09/15/17 1907  INR 1.03   Cardiac Enzymes: No results for input(s): CKTOTAL, CKMB, CKMBINDEX, TROPONINI in the last 168 hours. BNP (last 3 results) No results for input(s): PROBNP in the last 8760 hours. HbA1C: No results for input(s): HGBA1C in the last 72 hours. CBG: No results for input(s): GLUCAP in the last 168 hours. Lipid  Profile: No results for input(s): CHOL, HDL, LDLCALC, TRIG, CHOLHDL, LDLDIRECT in the last 72 hours. Thyroid Function Tests: No results for input(s): TSH, T4TOTAL, FREET4, T3FREE, THYROIDAB in the last 72 hours. Anemia Panel: Recent Labs    09/18/17 1823  VITAMINB12 399  FOLATE 13.2  FERRITIN 50  TIBC 269  IRON 12*   Sepsis Labs: No results for input(s): PROCALCITON, LATICACIDVEN in the last 168 hours.  Recent Results (from the past 240 hour(s))  MRSA PCR Screening     Status: None   Collection Time: 09/17/17 11:38 AM  Result Value Ref Range Status   MRSA by PCR NEGATIVE NEGATIVE Final    Comment:        The GeneXpert MRSA Assay (FDA approved for NASAL specimens only), is one component of Raya Mckinstry comprehensive MRSA colonization surveillance program. It is not intended to diagnose MRSA infection nor to guide or monitor treatment for MRSA infections. Performed at [facility], [place] [address], [place], [place] [address]          Radiology Studies: No results found.      Scheduled Meds: . acetaminophen  1,000 mg Oral Q8H  . aspirin EC  81 mg Oral BID  . docusate sodium  100 mg Oral BID  . escitalopram  10 mg Oral Daily  . famotidine  20 mg Oral Daily  . ferrous sulfate  325 mg Oral Q breakfast  . fluticasone  2 spray Each Nare Daily  . lactose free nutrition  100-237 mL  Oral Daily  . levothyroxine  25 mcg Oral QAC breakfast  . mirabegron ER  25 mg Oral QPM  . mirtazapine  15 mg Oral QPM  . mometasone-formoterol  2 puff Inhalation BID  . montelukast  10 mg Oral QPM  . polyethylene glycol  17 g Oral Daily  . senna  1 tablet Oral QHS   Continuous Infusions: . sodium chloride Stopped (09/17/17 1352)  . lactated ringers       LOS: 5 days    Time spent: over 30 min MDM mod with multiple medical issues     Fayrene Helper, MD [facility] Pager [phone]  If 7PM-7AM, please contact night-coverage [email or web address] Password [facility] 09/20/2017, 5:23 PM

## 2017-09-20 NOTE — Plan of Care (Signed)
Problem: Pain Management: Goal: Pain level will decrease Outcome: Progressing   Problem: Education: Goal: Knowledge of General Education information will improve Description Including pain rating scale, medication(s)/side effects and non-pharmacologic comfort measures Outcome: Progressing   Problem: Clinical Measurements: Goal: Will remain free from infection Outcome: Progressing   Problem: Coping: Goal: Level of anxiety will decrease Outcome: Progressing

## 2017-09-20 NOTE — Progress Notes (Signed)
Physical Therapy Treatment Patient Details Name: Kimberly Greer MRN: [id number] DOB: March 03, 1921 Today's Date: 09/20/2017    History of Present Illness Pt is a 84 year old woman from ALF who was admitted 09/15/17 after falling in the bathroom resulting in R hip and R wrist fxs. Pt initially refused sx, but later agreed and underwent ORIF on 8/14 of R wrist and IM nail of R hip. PMH: COPD/asthma, hypothyroidism, PVD, depression, osteoporosis, L proximal humerus fx.    PT Comments    Pt with significant pain, requiring total assist +2 with all mobilities this session. Able to progress OOB to recliner chair via maximove lift.  Continues to remain appropriate for SNF placement. Will follow acutely.   Follow Up Recommendations  SNF     Equipment Recommendations  None recommended by PT    Recommendations for Other Services       Precautions / Restrictions Precautions Precautions: Fall Restrictions Weight Bearing Restrictions: Yes RUE Weight Bearing: Weight bear through elbow only RLE Weight Bearing: Weight bearing as tolerated    Mobility  Bed Mobility Overal bed mobility: Needs Assistance Bed Mobility: Supine to Sit     Supine to sit: Max assist;Total assist;+2 for physical assistance     General bed mobility comments: Pt able to initiate some movement of BIL LEs, but was total assist for trunk management and hip progression to EOB.  Transfers Overall transfer level: Needs assistance         Squat pivot transfers: Total assist;+2 safety/equipment     General transfer comment: Kirt Boys for pt transfer OOB to recliner chair. RN assisted with transfer and educated on use of lift.  Ambulation/Gait             General Gait Details: unable at this time   Stairs             Wheelchair Mobility    Modified Rankin (Stroke Patients Only)       Balance Overall balance assessment: Needs assistance Sitting-balance support: Feet  supported;Bilateral upper extremity supported;Single extremity supported Sitting balance-Leahy Scale: Poor Sitting balance - Comments: Pt required min guard to min A while seated EOB Postural control: Posterior lean;Left lateral lean Standing balance support: Bilateral upper extremity supported Standing balance-Leahy Scale: Zero                              Cognition Arousal/Alertness: Awake/alert Behavior During Therapy: Flat affect Overall Cognitive Status: Impaired/Different from baseline                                 General Comments: Pt unable to recall WB precautions and required frequent cues throughout session to maintain NWB on R wrist      Exercises      General Comments        Pertinent Vitals/Pain Pain Assessment: Faces Faces Pain Scale: Hurts whole lot Pain Location: R hip with mobility Pain Descriptors / Indicators: Guarding;Grimacing Pain Intervention(s): Monitored during session;Limited activity within patient's tolerance;Repositioned    Home Living                      Prior Function            PT Goals (current goals can now be found in the care plan section) Acute Rehab PT Goals Patient Stated Goal: to get stronger PT Goal Formulation: With  patient Time For Goal Achievement: 10/02/17 Potential to Achieve Goals: Fair Progress towards PT goals: Progressing toward goals    Frequency    Min 2X/week      PT Plan Current plan remains appropriate    Co-evaluation              AM-PAC PT "6 Clicks" Daily Activity  Outcome Measure  Difficulty turning over in bed (including adjusting bedclothes, sheets and blankets)?: Unable Difficulty moving from lying on back to sitting on the side of the bed? : Unable Difficulty sitting down on and standing up from a chair with arms (e.g., wheelchair, bedside commode, etc,.)?: Unable Help needed moving to and from a bed to chair (including a wheelchair)?: Total Help  needed walking in hospital room?: Total Help needed climbing 3-5 steps with a railing? : Total 6 Click Score: 6    End of Session   Activity Tolerance: Patient limited by pain Patient left: in chair;with call bell/phone within reach;with nursing/sitter in room Nurse Communication: Mobility status;Need for lift equipment PT Visit Diagnosis: Other abnormalities of gait and mobility (R26.89);Pain Pain - Right/Left: Right Pain - part of body: Hip;Arm     Time: 8921-1941 PT Time Calculation (min) (ACUTE ONLY): 21 min  Charges:  $Therapeutic Activity: 8-22 mins                    Benjiman Core, [place] Pager [phone] Acute Rehab  Allena Katz 09/20/2017, 2:09 PM

## 2017-09-20 NOTE — Progress Notes (Signed)
Subjective: 3 Days Post-Op Procedure(s) (LRB): INTRAMEDULLARY (IM) NAIL INTERTROCHANTRIC HIP (Right) OPEN REDUCTION INTERNAL FIXATION (ORIF) DISTAL RADIAL FRACTURE (Right)  Patient reports pain as mild to moderate.  Patient resting comfortably in bed.  Denies fever, chills, N/V, CP, SOB  Objective:   VITALS:  Temp:  [97.4 F (36.3 C)-98.4 F (36.9 C)] 97.4 F (36.3 C) (08/17 0406) Pulse Rate:  [39-84] 63 (08/17 0406) Resp:  [14-19] 14 (08/17 0406) BP: (105-147)/(39-52) 147/50 (08/17 0406) SpO2:  [90 %-100 %] 100 % (08/17 0406)  General: WDWN patient in NAD. Psych:  Appropriate mood and affect. Neuro:  A&O x 3, Moving all extremities, sensation intact to light touch HEENT:  EOMs intact Chest:  Even non-labored respirations Skin:  Aquacel dressing/R UE splint C/D/I, no rashes or lesions Extremities: warm/dry, mild edema, no erythema, mild echymosis to lesser fingers.  No lymphadenopathy. Pulses: Popliteus/radial 2+ MSK:  ROM:TKE.  Can actively flex/ext fingers, MMT: able to perform quad set, 3/5 grip strength (-) Homan's    LABS Recent Labs    09/18/17 0427 09/18/17 1556 09/19/17 0655 09/19/17 1611 09/20/17 0330  HGB 7.9* 7.6* 6.9* 9.3* 8.4*  WBC 11.2*  --  9.9  --  9.1  PLT 189  --  192  --  210   Recent Labs    09/19/17 0655 09/20/17 0330  NA 142 143  K 3.9 4.3  CL 111 112*  CO2 25 25  BUN 23 23  CREATININE 0.98 0.81  GLUCOSE 108* 129*   No results for input(s): LABPT, INR in the last 72 hours.   Assessment/Plan: 3 Days Post-Op Procedure(s) (LRB): INTRAMEDULLARY (IM) NAIL INTERTROCHANTRIC HIP (Right) OPEN REDUCTION INTERNAL FIXATION (ORIF) DISTAL RADIAL FRACTURE (Right)  Patient seen in rounds for Dr. Stann Mainland R UE - platform WB okay.  Elevate with fingertips above elbow and shoulder if able R LE - WBAT.  Do not remove aquacel dressing ASA 81mg  and SCDs for DVT prophylaxis Plan for 2 week outpatient postop visit with Dr. Rosiland Oz  PA-C [facility] Office:  [phone]

## 2017-09-21 DIAGNOSIS — F329 Major depressive disorder, single episode, unspecified: Secondary | ICD-10-CM | POA: Diagnosis not present

## 2017-09-21 DIAGNOSIS — S72141A Displaced intertrochanteric fracture of right femur, initial encounter for closed fracture: Secondary | ICD-10-CM | POA: Diagnosis not present

## 2017-09-21 DIAGNOSIS — S72141S Displaced intertrochanteric fracture of right femur, sequela: Secondary | ICD-10-CM | POA: Diagnosis not present

## 2017-09-21 DIAGNOSIS — R5381 Other malaise: Secondary | ICD-10-CM | POA: Diagnosis not present

## 2017-09-21 DIAGNOSIS — L308 Other specified dermatitis: Secondary | ICD-10-CM | POA: Diagnosis not present

## 2017-09-21 DIAGNOSIS — K219 Gastro-esophageal reflux disease without esophagitis: Secondary | ICD-10-CM | POA: Diagnosis not present

## 2017-09-21 DIAGNOSIS — Z5189 Encounter for other specified aftercare: Secondary | ICD-10-CM | POA: Diagnosis not present

## 2017-09-21 DIAGNOSIS — R41841 Cognitive communication deficit: Secondary | ICD-10-CM | POA: Diagnosis not present

## 2017-09-21 DIAGNOSIS — M6284 Sarcopenia: Secondary | ICD-10-CM | POA: Diagnosis not present

## 2017-09-21 DIAGNOSIS — R55 Syncope and collapse: Secondary | ICD-10-CM | POA: Diagnosis not present

## 2017-09-21 DIAGNOSIS — M2550 Pain in unspecified joint: Secondary | ICD-10-CM | POA: Diagnosis not present

## 2017-09-21 DIAGNOSIS — R4189 Other symptoms and signs involving cognitive functions and awareness: Secondary | ICD-10-CM | POA: Diagnosis not present

## 2017-09-21 DIAGNOSIS — N3281 Overactive bladder: Secondary | ICD-10-CM | POA: Diagnosis not present

## 2017-09-21 DIAGNOSIS — S52561D Barton's fracture of right radius, subsequent encounter for closed fracture with routine healing: Secondary | ICD-10-CM | POA: Diagnosis not present

## 2017-09-21 DIAGNOSIS — D72829 Elevated white blood cell count, unspecified: Secondary | ICD-10-CM | POA: Diagnosis not present

## 2017-09-21 DIAGNOSIS — W19XXXA Unspecified fall, initial encounter: Secondary | ICD-10-CM | POA: Diagnosis not present

## 2017-09-21 DIAGNOSIS — R630 Anorexia: Secondary | ICD-10-CM | POA: Diagnosis not present

## 2017-09-21 DIAGNOSIS — J329 Chronic sinusitis, unspecified: Secondary | ICD-10-CM | POA: Diagnosis not present

## 2017-09-21 DIAGNOSIS — R278 Other lack of coordination: Secondary | ICD-10-CM | POA: Diagnosis not present

## 2017-09-21 DIAGNOSIS — S52521D Torus fracture of lower end of right radius, subsequent encounter for fracture with routine healing: Secondary | ICD-10-CM | POA: Diagnosis not present

## 2017-09-21 DIAGNOSIS — R2681 Unsteadiness on feet: Secondary | ICD-10-CM | POA: Diagnosis not present

## 2017-09-21 DIAGNOSIS — M1991 Primary osteoarthritis, unspecified site: Secondary | ICD-10-CM | POA: Diagnosis not present

## 2017-09-21 DIAGNOSIS — J449 Chronic obstructive pulmonary disease, unspecified: Secondary | ICD-10-CM | POA: Diagnosis not present

## 2017-09-21 DIAGNOSIS — S52602D Unspecified fracture of lower end of left ulna, subsequent encounter for closed fracture with routine healing: Secondary | ICD-10-CM | POA: Diagnosis not present

## 2017-09-21 DIAGNOSIS — R413 Other amnesia: Secondary | ICD-10-CM | POA: Diagnosis not present

## 2017-09-21 DIAGNOSIS — E785 Hyperlipidemia, unspecified: Secondary | ICD-10-CM | POA: Diagnosis not present

## 2017-09-21 DIAGNOSIS — M810 Age-related osteoporosis without current pathological fracture: Secondary | ICD-10-CM | POA: Diagnosis not present

## 2017-09-21 DIAGNOSIS — I739 Peripheral vascular disease, unspecified: Secondary | ICD-10-CM | POA: Diagnosis not present

## 2017-09-21 DIAGNOSIS — Z7401 Bed confinement status: Secondary | ICD-10-CM | POA: Diagnosis not present

## 2017-09-21 DIAGNOSIS — R32 Unspecified urinary incontinence: Secondary | ICD-10-CM | POA: Diagnosis not present

## 2017-09-21 DIAGNOSIS — S52501S Unspecified fracture of the lower end of right radius, sequela: Secondary | ICD-10-CM | POA: Diagnosis not present

## 2017-09-21 DIAGNOSIS — R29898 Other symptoms and signs involving the musculoskeletal system: Secondary | ICD-10-CM | POA: Diagnosis not present

## 2017-09-21 DIAGNOSIS — R410 Disorientation, unspecified: Secondary | ICD-10-CM | POA: Diagnosis not present

## 2017-09-21 DIAGNOSIS — R296 Repeated falls: Secondary | ICD-10-CM | POA: Diagnosis not present

## 2017-09-21 DIAGNOSIS — S72141D Displaced intertrochanteric fracture of right femur, subsequent encounter for closed fracture with routine healing: Secondary | ICD-10-CM | POA: Diagnosis not present

## 2017-09-21 DIAGNOSIS — Z4789 Encounter for other orthopedic aftercare: Secondary | ICD-10-CM | POA: Diagnosis not present

## 2017-09-21 DIAGNOSIS — Z9181 History of falling: Secondary | ICD-10-CM | POA: Diagnosis not present

## 2017-09-21 DIAGNOSIS — M6281 Muscle weakness (generalized): Secondary | ICD-10-CM | POA: Diagnosis not present

## 2017-09-21 DIAGNOSIS — S42292A Other displaced fracture of upper end of left humerus, initial encounter for closed fracture: Secondary | ICD-10-CM | POA: Diagnosis not present

## 2017-09-21 DIAGNOSIS — J42 Unspecified chronic bronchitis: Secondary | ICD-10-CM | POA: Diagnosis not present

## 2017-09-21 DIAGNOSIS — Z0000 Encounter for general adult medical examination without abnormal findings: Secondary | ICD-10-CM | POA: Diagnosis not present

## 2017-09-21 DIAGNOSIS — Z8781 Personal history of (healed) traumatic fracture: Secondary | ICD-10-CM | POA: Diagnosis not present

## 2017-09-21 DIAGNOSIS — E039 Hypothyroidism, unspecified: Secondary | ICD-10-CM | POA: Diagnosis not present

## 2017-09-21 DIAGNOSIS — S52501D Unspecified fracture of the lower end of right radius, subsequent encounter for closed fracture with routine healing: Secondary | ICD-10-CM | POA: Diagnosis not present

## 2017-09-21 DIAGNOSIS — S72009A Fracture of unspecified part of neck of unspecified femur, initial encounter for closed fracture: Secondary | ICD-10-CM | POA: Diagnosis not present

## 2017-09-21 DIAGNOSIS — M818 Other osteoporosis without current pathological fracture: Secondary | ICD-10-CM | POA: Diagnosis not present

## 2017-09-21 DIAGNOSIS — S52531D Colles' fracture of right radius, subsequent encounter for closed fracture with routine healing: Secondary | ICD-10-CM | POA: Diagnosis not present

## 2017-09-21 DIAGNOSIS — D500 Iron deficiency anemia secondary to blood loss (chronic): Secondary | ICD-10-CM | POA: Diagnosis not present

## 2017-09-21 DIAGNOSIS — J45909 Unspecified asthma, uncomplicated: Secondary | ICD-10-CM | POA: Diagnosis not present

## 2017-09-21 DIAGNOSIS — R1313 Dysphagia, pharyngeal phase: Secondary | ICD-10-CM | POA: Diagnosis not present

## 2017-09-21 LAB — BASIC METABOLIC PANEL
ANION GAP: 5 (ref 5–15)
BUN: 22 mg/dL (ref 8–23)
CALCIUM: 8.4 mg/dL — AB (ref 8.9–10.3)
CO2: 26 mmol/L (ref 22–32)
Chloride: 112 mmol/L — ABNORMAL HIGH (ref 98–111)
Creatinine, Ser: 0.8 mg/dL (ref 0.44–1.00)
GFR calc Af Amer: >60 mL/min (ref >60)
GFR calc non Af Amer: >60 mL/min (ref >60)
Glucose, Bld: 112 mg/dL — ABNORMAL HIGH (ref 70–99)
POTASSIUM: 4.4 mmol/L (ref 3.5–5.1)
SODIUM: 143 mmol/L (ref 135–145)

## 2017-09-21 LAB — CBC
HEMATOCRIT: 28.5 % — AB (ref 36.0–46.0)
HEMOGLOBIN: 8.9 g/dL — AB (ref 12.0–15.0)
MCH: 30.1 pg (ref 26.0–34.0)
MCHC: 31.2 g/dL (ref 30.0–36.0)
MCV: 96.3 fL (ref 78.0–100.0)
PLATELETS: 263 10*3/uL (ref 150–400)
RBC: 2.96 MIL/uL — ABNORMAL LOW (ref 3.87–5.11)
RDW: 15.1 % (ref 11.5–15.5)
WBC: 10.9 10*3/uL — ABNORMAL HIGH (ref 4.0–10.5)

## 2017-09-21 LAB — MAGNESIUM: MAGNESIUM: 2.2 mg/dL (ref 1.7–2.4)

## 2017-09-21 MED ORDER — OXYCODONE HCL 5 MG PO TABS: 2.5000 mg | ORAL_TABLET | Freq: Four times a day (QID) | ORAL | 0 refills | Status: AC | PRN

## 2017-09-21 MED ORDER — POLYETHYLENE GLYCOL 3350 17 G PO PACK: 17.0000 g | PACK | Freq: Every day | ORAL | 0 refills | Status: DC

## 2017-09-21 MED ORDER — FERROUS SULFATE 325 (65 FE) MG PO TABS: 325.0000 mg | ORAL_TABLET | Freq: Every day | ORAL | 0 refills | Status: DC

## 2017-09-21 MED ORDER — ASPIRIN 81 MG PO TBEC: 81.0000 mg | DELAYED_RELEASE_TABLET | Freq: Two times a day (BID) | ORAL | 0 refills | Status: AC

## 2017-09-21 NOTE — Clinical Social Work Placement (Signed)
   CLINICAL SOCIAL WORK PLACEMENT  NOTE  Date:  09/21/2017  Patient Details  Name: Kimberly Greer MRN: [id number] Date of Birth: 12-01-1921  Clinical Social Work is seeking post-discharge placement for this patient at the [place] level of care (*CSW will initial, date and re-position this form in  chart as items are completed):      Patient/family provided with [place] Work Department's list of facilities offering this level of care within the geographic area requested by the patient (or if unable, by the patient's family).  Yes   Patient/family informed of their freedom to choose among providers that offer the needed level of care, that participate in Medicare, Medicaid or managed care program needed by the patient, have an available bed and are willing to accept the patient.      Patient/family informed of [facility]'s ownership interest in [facility] and [facility], as well as of the fact that they are under no obligation to receive care at these facilities.  PASRR submitted to EDS on       PASRR number received on 09/19/17     Existing PASRR number confirmed on       FL2 transmitted to all facilities in geographic area requested by pt/family on 09/19/17     FL2 transmitted to all facilities within larger geographic area on       Patient informed that his/her managed care company has contracts with or will negotiate with certain facilities, including the following:        Yes   Patient/family informed of bed offers received.  Patient chooses bed at [facility]     Physician recommends and patient chooses bed at      Patient to be transferred to [facility] on 09/21/17.  Patient to be transferred to facility by [facility]     Patient family notified on 09/21/17 of transfer.  Name of family member notified:  [place], daughter in law     PHYSICIAN       Additional Comment:     _______________________________________________ Eileen Stanford, LCSW 09/21/2017, 2:15 PM

## 2017-09-21 NOTE — Progress Notes (Signed)
Subjective: 4 Days Post-Op Procedure(s) (LRB): INTRAMEDULLARY (IM) NAIL INTERTROCHANTRIC HIP (Right) OPEN REDUCTION INTERNAL FIXATION (ORIF) DISTAL RADIAL FRACTURE (Right) Patient reports pain as 3 on 0-10 scale.   The patient is resting comfortably in bed eating breakfast. She feels that her pain is well-controlled currently.  She is tolerating food and liquid without difficulty. She had a bowel movement yesterday.  She denies chest pain, SOB, N/V/D, fever, or chills.    Objective: Vital signs in last 24 hours: Temp:  [97.6 F (36.4 C)-99.4 F (37.4 C)] 97.6 F (36.4 C) (08/18 0615) Pulse Rate:  [52-84] 52 (08/18 0615) Resp:  [16] 16 (08/17 2112) BP: (112-126)/(45-73) 126/73 (08/18 0615) SpO2:  [91 %-100 %] 91 % (08/18 0615)  Intake/Output from previous day: 08/17 0701 - 08/18 0700 In: -  Out: 800 [Urine:800] Intake/Output this shift: No intake/output data recorded.  Recent Labs    09/18/17 1556 09/19/17 0655 09/19/17 1611 09/20/17 0330 09/21/17 0235  HGB 7.6* 6.9* 9.3* 8.4* 8.9*   Recent Labs    09/20/17 0330 09/21/17 0235  WBC 9.1 10.9*  RBC 2.77* 2.96*  HCT 26.7* 28.5*  PLT 210 263   Recent Labs    09/20/17 0330 09/21/17 0235  NA 143 143  K 4.3 4.4  CL 112* 112*  CO2 25 26  BUN 23 22  CREATININE 0.81 0.80  GLUCOSE 129* 112*  CALCIUM 8.1* 8.4*   No results for input(s): LABPT, INR in the last 72 hours.  A&O x 4 Intact pulses distally Dorsiflexion/Plantar flexion intact  Sensation intact to light touch distally.  Capillary refill is less than 2 seconds. Able to wiggle all fingers without difficulty.  RUE: Short arm volar splint on the RUE is C/D/I Moderate swelling of the right hand with ecchymosis. Able to wiggle all fingers without difficulty.  RLE: Dressing on the right hip is intact with no drainage. Ecchymosis of the right hip with no erythema.  (-) Homan's  Assessment/Plan: 4 Days Post-Op Procedure(s) (LRB): INTRAMEDULLARY (IM) NAIL  INTERTROCHANTRIC HIP (Right) OPEN REDUCTION INTERNAL FIXATION (ORIF) DISTAL RADIAL FRACTURE (Right) Advance diet   Right upper extremity - Continue with elevation and ice of the arm. Encouraged the patient to wiggle fingers and attempt to make a fist with the hand. Continue to keep the splint clean and dry.   Right lower extremity - WBAT to the right hip. Continue to keep the post-operative bandage in place until follow-up.  Continue with Aspirin 81mg  PO Q BID with SCDs for DVT prophylaxis    Kimberly Greer 09/21/2017, 8:20 AM

## 2017-09-21 NOTE — Clinical Social Work Note (Signed)
Clinical Social Worker facilitated patient discharge including contacting patient family and facility to confirm patient discharge plans.  Clinical information faxed to facility and family agreeable with plan.  CSW arranged ambulance transport via [facility] to .  RN to call [phone] for report prior to discharge.  Clinical Social Worker will sign off for now as social work intervention is no longer needed. Please consult [place] again if new need arises.  Loletha Grayer, MSW [phone]

## 2017-09-21 NOTE — Progress Notes (Addendum)
Report called and given to Hoyle Sauer, RN at [facility]. All questions answered to satisfaction. Awaiting [facility] for transportation. Will continue to monitor.   AVS, printed prescriptions, social worker's paperwork, and DNR paper placed in discharge packet. Awaiting [facility] for transportation. Will continue to monitor.

## 2017-09-21 NOTE — Discharge Summary (Addendum)
Physician Discharge Summary  Kimberly Greer RSW:[id number] DOB: [birth year] DOA: 09/15/2017  PCP: Kimberly Serve, MD  Admit date: 09/15/2017 Discharge date: 09/21/2017  Time spent: 40 minutes  Recommendations for Outpatient Follow-up:  1. Follow up outpatient CBC/CMP 2. Ensure orthopedic follow up -  2 weeks with Dr. Stann Mainland and Dr. Caralyn Guile 3. Weight bearing as tolerated to right lower extremity 4. Non weight bearing to right upper extremity, ok for platform walker - elevate and ice right upper extremity 5. Pt with chronic pansinusitis as well as possible superimposed fungal infection.  Recommendation to follow up as outpatient with ENT discussed with pt and family.  6. Continue to follow diet, pt has had decreased appetite here post op  Discharge Diagnoses:  Active Problems:   Hypothyroid   Memory loss   PVD (peripheral vascular disease) (HCC)   Hip fracture (HCC)   Displaced intertrochanteric fracture of right femur, initial encounter for closed fracture ([place])   COPD (chronic obstructive pulmonary disease) ([place])   Palliative care by specialist   Goals of care, counseling/discussion   Fall   Closed fracture of right distal radius   Discharge Condition: stable  Diet recommendation: heart healthy  Filed Weights   09/15/17 1719 09/17/17 1437  Weight: 65 kg 65 kg    History of present illness:  Kimberly Greer a 84 y.o.femalewithhistory of COPD/asthma, hypothyroidism presents to the ER after patient had a fall. Patient states she was walking to the bathroom when she slipped and fell. Denies hitting head or losing consciousness. She fell onto her right arm. Since the fall patient has benign pain in the right hip and right wrist.  She was admitted for R hip fracture and R wrist fracture.  Now s/p operative repair on 8/14.  She was transfused 1 unit pRBC post op.  On the day of discharge she was doing well without any pain.  Plan for d/c to SNF.  See below for  additional details  Hospital Course:  1. Right hip fracture and right wrist fracture -  1. Pain well controlled today.  Discharge with tylenol, oxycodone as needed.   2. S/p right hip fracture repair with treatment of intertrochanteric, pertrochanteric, subtrochanteric fracture with intramedullary implant and right wrist fracture with open treatment of R wrist intra articular distal radius fx 3 or more fragments, R wrist brachioradialis tendon release, tendon tenotomy on 8/14 3. 81 ASA BID for DVT ppx 4. Mobilize    2. Anemia: Downtrend post op, suspect ABLA.  Pt hemodynamically stable.  Iron for iron deficiency anemia.  1. Transfuse 1 unit pRBC 2. 6.9 to 9.3 post transfusion.  8.9 today, stable post transfusion.  3. Acute Hypoxic Respiratory Failure:  Suspect post op atelectasis.  Weaned to room air.  4. Syncope:  Described as mechanical fall in H&P, but pt states to me she "passed out" after getting up from using bathroom.  Orthostatic vs reflex syncope possible.  Cardiac etiology also possible with lack of prodrome.  EKG appears c/w priors.  She denies any CP or SOB with exertion.  Pt is stable.  Follow up outpatient.   5. History of asthma continue home inhalers.  stable, no wheezing.  6. Hypothyroidism continue synthroid, stable  7. Acute renal failure creatine improved and stable  8. LBBB-denies any chest pain, chronic  9. Hyperglycemia-check hemoglobin A1c. 6, prediabetes.  10. Leukocytosis - follow, reactive.  Improved.  11. Chronic Pansinusitis: ? Superimposed chronic fungal infection in R maxillary sinus.  Follow for now.  Afebrile.  ID recommended pt follow up with ENT as outpatient.  Will recommend outpatient ENT follow up.  12. Decreased appetite: Continue to follow post op.  Continue to encourage PO.  Per discussion with DIL, did well with ensure/ice cream.  Follow.  Procedures: S/p right hip fracture repair with treatment of intertrochanteric,  pertrochanteric, subtrochanteric fracture with intramedullary implant and right wrist fracture with open treatment of R wrist intra articular distal radius fx 3 or more fragments, R wrist brachioradialis tendon release, tendon tenotomy on 8/14  Consultations:  orthopedics  Discharge Exam: Vitals:   09/21/17 0615 09/21/17 0937  BP: 126/73   Pulse: (!) 52   Resp:    Temp: 97.6 F (36.4 C)   SpO2: 91% 91%   Ready to go home Decreased appetite. Called both home/cell numbers for family, but no answer, left msg (called back and spoke with son).  General: No acute distress. Cardiovascular: Heart sounds show a regular rate, and rhythm.  Lungs: Clear to auscultation bilaterally with good air movement. Abdomen: Soft, nontender, nondistended Neurological: Alert and oriented 3. Moves all extremities 4. Cranial nerves II through XII grossly intact. Skin: Warm and dry. No rashes or lesions. Extremities: RLE with dressing intact, short arm splint to RUE Psychiatric: Mood and affect are normal. Insight and judgment are appropriate.  Discharge Instructions   Discharge Instructions    Call MD for:  difficulty breathing, headache or visual disturbances   Complete by:  As directed    Call MD for:  extreme fatigue   Complete by:  As directed    Call MD for:  persistant dizziness or light-headedness   Complete by:  As directed    Call MD for:  persistant nausea and vomiting   Complete by:  As directed    Call MD for:  redness, tenderness, or signs of infection (pain, swelling, redness, odor or green/yellow discharge around incision site)   Complete by:  As directed    Call MD for:  temperature >100.4   Complete by:  As directed    Diet - low sodium heart healthy   Complete by:  As directed    Discharge instructions   Complete by:  As directed    You were seen for right hip fracture and wrist fracture.   These were surgically repaired.  You received a blood transfusion for low blood  counts, but this is now stable, we've started you on iron.  You should follow up with your orthopedic doctors in a couple of weeks.  Please follow up with your PCP within the next week.  You are non weight bearing to your right upper extremity (you can use the platform walker) and weight bearing as tolerated to the right lower extremity.  You have chronic sinusitis.  Please follow up with ENT as an outpatient to follow this up.   Return for new, recurrent, or worsening symptoms.  Please ask your PCP to request records from this hospitalization so they know what was done and what the next steps will be.   Increase activity slowly   Complete by:  As directed      Allergies as of 09/21/2017      Reactions   Sulfa Antibiotics Itching, Rash   Amoxicillin Other (See Comments)   Per [facility] 09/15/17 Has patient had a PCN reaction causing immediate rash, facial/tongue/throat swelling, SOB or lightheadedness with hypotension: Unknown Has patient had a PCN reaction causing severe rash involving mucus membranes or skin necrosis: Unknown Has  patient had a PCN reaction that required hospitalization: Unknown Has patient had a PCN reaction occurring within the last 10 years: Unknown If all of the above answers are "NO", then may proceed with Cephalosporin use.   Avelox [moxifloxacin Hcl In Nacl] Itching   Doxycycline Other (See Comments)   Unknown reaction   Hista-tabs [triprolidine-pse] Other (See Comments)   Passed out   Pyrilamine-phenylephrine Other (See Comments)   Unknown reaction - per [facility] 09/15/17   Septra [sulfamethoxazole-trimethoprim] Other (See Comments)   Unknown reaction      Medication List    TAKE these medications   acetaminophen 325 MG tablet Commonly known as:  TYLENOL Take 2 tablets (650 mg total) by mouth every 6 (six) hours as needed for mild pain or moderate pain. What changed:  reasons to take this   aspirin 81 MG EC tablet Take 1 tablet (81 mg total) by mouth 2 (two)  times daily.   escitalopram 10 MG tablet Commonly known as:  LEXAPRO Take 10 mg by mouth daily.   feeding supplement Liqd Take 100-237 mLs by mouth daily.   ferrous sulfate 325 (65 FE) MG tablet Take 1 tablet (325 mg total) by mouth daily with breakfast. Start taking on:  09/22/2017   fluticasone 50 MCG/ACT nasal spray Commonly known as:  FLONASE Place 2 sprays into both nostrils daily.   Fluticasone-Salmeterol 250-50 MCG/DOSE Aepb Commonly known as:  ADVAIR Inhale 1 puff 2 (two) times daily into the lungs. Rinse mouth after use   furosemide 40 MG tablet Commonly known as:  LASIX Take 40 mg by mouth daily.   ipratropium 0.02 % nebulizer solution Commonly known as:  ATROVENT Take 0.5 mg by nebulization every 6 (six) hours as needed for wheezing or shortness of breath.   levothyroxine 25 MCG tablet Commonly known as:  SYNTHROID, LEVOTHROID Take 25 mcg by mouth daily before breakfast.   mirtazapine 15 MG tablet Commonly known as:  REMERON Take 15 mg by mouth every evening.   montelukast 10 MG tablet Commonly known as:  SINGULAIR Take one tablet by mouth once daily to help breathing What changed:    how much to take  how to take this  when to take this  additional instructions   MYRBETRIQ 25 MG Tb24 tablet Generic drug:  mirabegron ER TAKE ONE TABLET BY MOUTH ONCE DAILY What changed:    how much to take  when to take this   oxyCODONE 5 MG immediate release tablet Commonly known as:  Oxy IR/ROXICODONE Take 0.5 tablets (2.5 mg total) by mouth every 6 (six) hours as needed for up to 3 days for moderate pain.   OXYGEN Inhale 2 L/min into the lungs as needed (to keep O2 SATS >90%).   polyethylene glycol packet Commonly known as:  MIRALAX / GLYCOLAX Take 17 g by mouth daily. Start taking on:  09/22/2017   raloxifene 60 MG tablet Commonly known as:  EVISTA Take 1 tablet by mouth  daily to treat osteoporosis What changed:  See the new instructions.    ranitidine 150 MG tablet Commonly known as:  ZANTAC Take 150 mg by mouth daily.   VENTOLIN HFA 108 (90 Base) MCG/ACT inhaler Generic drug:  albuterol Inhale 2 puffs into the lungs every 6 (six) hours as needed for shortness of breath (dyspnea).   zinc oxide 20 % ointment Apply 1 application topically See admin instructions. Start date 09/08/17: apply topically to buttocks every shift for 10 days  and as needed after  incontinent episodes.      Allergies  Allergen Reactions  . Sulfa Antibiotics Itching and Rash  . Amoxicillin Other (See Comments)    Per [facility] 09/15/17 Has patient had a PCN reaction causing immediate rash, facial/tongue/throat swelling, SOB or lightheadedness with hypotension: Unknown Has patient had a PCN reaction causing severe rash involving mucus membranes or skin necrosis: Unknown Has patient had a PCN reaction that required hospitalization: Unknown Has patient had a PCN reaction occurring within the last 10 years: Unknown If all of the above answers are "NO", then may proceed with Cephalosporin use.  . Avelox [Moxifloxacin Hcl In Nacl] Itching  . Doxycycline Other (See Comments)    Unknown reaction  . Hista-Tabs [Triprolidine-Pse] Other (See Comments)    Passed out  . Pyrilamine-Phenylephrine Other (See Comments)    Unknown reaction - per [facility] 09/15/17  . Septra [Sulfamethoxazole-Trimethoprim] Other (See Comments)    Unknown reaction   Follow-up Information    Nicholes Stairs, MD.   Specialty:  Orthopedic Surgery Why:  For suture removal, For wound re-check Contact information: [address] [place] [address] [phone]        Iran Planas, MD Follow up.   Specialty:  Orthopedic Surgery Why:  Please call for follow up appointment in 2 weeks for follow up, suture removal Contact information: [address] [place] [address] [phone]        Kimberly Serve, MD Follow up.   Specialty:  Internal  Medicine Contact information: [place] [address] [phone]        Brunetta Jeans, MD Follow up.   Specialty:  Otolaryngology Why:  Call for follow up for chronic sinusitis            The results of significant diagnostics from this hospitalization (including imaging, microbiology, ancillary and laboratory) are listed below for reference.    Significant Diagnostic Studies: Dg Chest 1 View  Result Date: 09/15/2017 CLINICAL DATA:  Right shoulder pain. EXAM: CHEST  1 VIEW COMPARISON:  04/05/2009 FINDINGS: Hyperexpansion is consistent with emphysema. Cardiopericardial silhouette is at upper limits of normal for size. Bones are diffusely demineralized. Telemetry leads overlie the chest. IMPRESSION: Chronic changes without acute findings. Electronically Signed   By: Misty Stanley M.D.   On: 09/15/2017 19:14   Dg Shoulder Right  Result Date: 09/15/2017 CLINICAL DATA:  Fall, right shoulder pain EXAM: RIGHT SHOULDER - 2+ VIEW COMPARISON:  None. FINDINGS: Degenerative changes in the [facility] joint with joint space narrowing and spurring. Glenohumeral joint is maintained. No acute bony abnormality. Specifically, no fracture, subluxation, or dislocation. Soft tissues are intact. IMPRESSION: No acute bony abnormality. Electronically Signed   By: Rolm Baptise M.D.   On: 09/15/2017 19:14   Dg Wrist Complete Right  Result Date: 09/15/2017 CLINICAL DATA:  Fall with wrist deformity EXAM: RIGHT WRIST - COMPLETE 3+ VIEW COMPARISON:  None. FINDINGS: There is a comminuted, predominantly transverse fracture of the distal right radius with moderate lateral displacement. There is also a minimally displaced fracture of the distal left ulna, involving the ulnar styloid. There is severe first carpometacarpal joint osteoarthrosis. No carpal fracture is identified. IMPRESSION: 1. Comminuted fracture of the distal right radius with lateral displacement. 2. Nondisplaced fracture of the distal left  ulna. 3. Severe first CMC joint osteoarthrosis. Electronically Signed   By: Ulyses Jarred M.D.   On: 09/15/2017 20:31   Ct Head Wo Contrast  Result Date: 09/15/2017 CLINICAL DATA:  84 year old  who fell in her bathroom earlier today and was unable to get up on her own. It is unclear how long the patient was on the floor. Initial encounter. EXAM: CT HEAD WITHOUT CONTRAST TECHNIQUE: Contiguous axial images were obtained from the base of the skull through the vertex without intravenous contrast. COMPARISON:  11/06/2014, 04/25/2011, 09/11/2006. FINDINGS: Brain: Significant head tilt in the gantry accounts for apparent asymmetry in the cerebral hemispheres. Moderate to severe age appropriate cortical, deep and cerebellar atrophy, unchanged. Severe changes of small vessel disease of the white matter diffusely, unchanged. No mass lesion. No midline shift. No acute hemorrhage or hematoma. No extra-axial fluid collections. No evidence of acute infarction. Vascular: Moderate BILATERAL carotid siphon atherosclerosis. No hyperdense vessel. Skull: No skull fracture or other focal osseous abnormality involving the skull. Sinuses/Orbits: Near complete opacification of the RIGHT maxillary sinus with hyperdense material in the sinus. Mucosal thickening involving the LEFT maxillary sinus and the LEFT sphenoid sinus. Opacification of multiple BILATERAL ethmoid air cells. RIGHT sphenoid sinus in both frontal sinuses well aerated. BILATERAL mastoid air cells and BILATERAL middle ear cavities well-aerated. Visualized orbits and globes normal in appearance. Other: None. IMPRESSION: 1. No acute intracranial abnormality. 2. Moderate to severe age-appropriate generalized atrophy and severe chronic microvascular ischemic changes of the white matter. 3. Chronic pansinusitis. Query superimposed chronic fungal infection in the RIGHT maxillary sinus. Electronically Signed   By: Evangeline Dakin M.D.   On: 09/15/2017 20:12   Dg C-arm 1-60  Min  Result Date: 09/17/2017 CLINICAL DATA:  Hip fracture EXAM: DG C-ARM 61-120 MIN; OPERATIVE RIGHT HIP WITH PELVIS COMPARISON:  09/15/2017 FINDINGS: Four low resolution intraoperative spot views of the right hip. Total fluoroscopy time was 55 seconds. The images demonstrate intramedullary rod and screw fixation of the proximal right femur for intertrochanteric fracture. Displaced lesser trochanteric fracture fragment is noted. IMPRESSION: Intraoperative fluoroscopic assistance provided during surgical fixation of right femur fracture Electronically Signed   By: Donavan Foil M.D.   On: 09/17/2017 19:14   Dg Hip Operative Unilat W Or W/o Pelvis Right  Result Date: 09/17/2017 CLINICAL DATA:  Hip fracture EXAM: DG C-ARM 61-120 MIN; OPERATIVE RIGHT HIP WITH PELVIS COMPARISON:  09/15/2017 FINDINGS: Four low resolution intraoperative spot views of the right hip. Total fluoroscopy time was 55 seconds. The images demonstrate intramedullary rod and screw fixation of the proximal right femur for intertrochanteric fracture. Displaced lesser trochanteric fracture fragment is noted. IMPRESSION: Intraoperative fluoroscopic assistance provided during surgical fixation of right femur fracture Electronically Signed   By: Donavan Foil M.D.   On: 09/17/2017 19:14   Dg Hip Unilat With Pelvis 2-3 Views Right  Result Date: 09/15/2017 CLINICAL DATA:  Fall, right hip pain EXAM: DG HIP (WITH OR WITHOUT PELVIS) 2-3V RIGHT COMPARISON:  None. FINDINGS: Right femoral intertrochanteric fracture noted with varus angulation. Lesser trochanter is displaced. No subluxation or dislocation. SI joints are symmetric and unremarkable. IMPRESSION: Right femoral intertrochanteric fracture with varus angulation and displacement of fracture fragments. Electronically Signed   By: Rolm Baptise M.D.   On: 09/15/2017 19:14    Microbiology: Recent Results (from the past 240 hour(s))  MRSA PCR Screening     Status: None   Collection Time:  09/17/17 11:38 AM  Result Value Ref Range Status   MRSA by PCR NEGATIVE NEGATIVE Final    Comment:        The GeneXpert MRSA Assay (FDA approved for NASAL specimens only), is one component of a comprehensive MRSA colonization surveillance program.  It is not intended to diagnose MRSA infection nor to guide or monitor treatment for MRSA infections. Performed at [facility], [place] [address], [place], [place] [address]      Labs: Basic Metabolic Panel: Recent Labs  Lab 09/17/17 0356 09/18/17 0427 09/19/17 0655 09/20/17 0330 09/21/17 0235  NA 141 141 142 143 143  K 4.0 4.6 3.9 4.3 4.4  CL 110 108 111 112* 112*  CO2 25 25 25 25 26   GLUCOSE 119* 140* 108* 129* 112*  BUN 20 19 23 23 22   CREATININE 1.08* 1.20* 0.98 0.81 0.80  CALCIUM 8.3* 8.0* 8.1* 8.1* 8.4*  MG 2.3  --  2.3 2.3 2.2   Liver Function Tests: No results for input(s): AST, ALT, ALKPHOS, BILITOT, PROT, ALBUMIN in the last 168 hours. No results for input(s): LIPASE, AMYLASE in the last 168 hours. No results for input(s): AMMONIA in the last 168 hours. CBC: Recent Labs  Lab 09/15/17 1907 09/16/17 0407 09/17/17 0356 09/18/17 0427 09/18/17 1556 09/19/17 0655 09/19/17 1611 09/20/17 0330 09/21/17 0235  WBC 16.6* 11.2* 10.4 11.2*  --  9.9  --  9.1 10.9*  NEUTROABS 14.0* 8.7*  --   --   --   --   --   --   --   HGB 12.7 11.4* 9.8* 7.9* 7.6* 6.9* 9.3* 8.4* 8.9*  HCT 40.3 37.2 31.2* 25.7* 25.2* 22.9* 29.8* 26.7* 28.5*  MCV 93.3 94.7 95.1 96.3  --  98.3  --  96.4 96.3  PLT 252 251 204 189  --  192  --  210 263   Cardiac Enzymes: No results for input(s): CKTOTAL, CKMB, CKMBINDEX, TROPONINI in the last 168 hours. BNP: BNP (last 3 results) No results for input(s): BNP in the last 8760 hours.  ProBNP (last 3 results) No results for input(s): PROBNP in the last 8760 hours.  CBG: No results for input(s): GLUCAP in the last 168 hours.     Signed:  Fayrene Helper MD.  [facility] 09/21/2017,  1:41 PM

## 2017-09-21 NOTE — Plan of Care (Signed)
Problem: Activity: Goal: Ability to ambulate and perform ADLs will improve Outcome: Progressing   Problem: Self-Concept: Goal: Ability to maintain and perform role responsibilities to the fullest extent possible will improve Outcome: Progressing   Problem: Pain Management: Goal: Pain level will decrease Outcome: Progressing   Problem: Nutrition: Goal: Adequate nutrition will be maintained Outcome: Progressing

## 2017-09-21 NOTE — Progress Notes (Signed)
PROGRESS NOTE    Kimberly Greer  SWF:[id number] DOB: [birth year] DOA: 09/15/2017 PCP: Blanchie Serve, MD   Brief Narrative:  Kimberly Greer is a 84 y.o. female with history of COPD/asthma, hypothyroidism presents to the ER after patient had a fall.  Patient states she was walking to the bathroom when she slipped and fell.  Denies hitting head or losing consciousness.  She fell onto her right arm.  Since the fall patient has benign pain in the right hip and right wrist.  Assessment & Plan:   Active Problems:   Hypothyroid   Memory loss   PVD (peripheral vascular disease) (HCC)   Hip fracture (HCC)   Displaced intertrochanteric fracture of right femur, initial encounter for closed fracture ([place])   COPD (chronic obstructive pulmonary disease) ([place])   Palliative care by specialist   Goals of care, counseling/discussion   Fall   Closed fracture of right distal radius   1. Right hip fracture and right wrist fracture -  1. APAP, oxy, morphine for pain, bowel regimen.  Pain seems controlled. 2. S/p right hip fracture repair with treatment of intertrochanteric, pertrochanteric, subtrochanteric fracture with intramedullary implant and right wrist fracture with open treatment of R wrist intra articular distal radius fx 3 or more fragments, R wrist brachioradialis tendon release, tendon tenotomy on 8/14 3. 81 ASA BID for DVT ppx 4. Mobilize 5. Likely d/c to SNF today if stable 6. Foley in place, remove this, trial of void (on discussion with nurse they note still in place 2/2 retention, but I don't see this noted in chart)    2. Anemia: Downtrend post op, suspect ABLA.  Pt hemodynamically stable.  Iron for iron deficiency anemia.  1. Transfuse 1 unit pRBC 2. 6.9 to 9.3 post transfusion.  8.9 today, stable.  3. Acute Hypoxic Respiratory Failure:  Suspect post op atelectasis.  Weaned to room air.  4. Syncope:  Described as mechanical fall in H&P, but pt states to me she "passed out"  after getting up from using bathroom.  Orthostatic vs reflex syncope possible.  Cardiac etiology also possible with lack of prodrome.  EKG appears c/w priors.  She denies any CP or SOB with exertion.  I don't think echo would meaningfully change our management at this point based on our discussion yesterday and would likely delay surgery unnecessarily.  Pt is stable.   5. History of asthma continue home inhalers.  stable, no wheezing.  6. Hypothyroidism continue synthroid, stable  7. Acute renal failure creatine improved and stable  8. LBBB -denies any chest pain, chronic  9. Hyperglycemia -check hemoglobin A1c. 6, prediabetes.  10. Leukocytosis - follow, reactive.  Improved.  11. Chronic Pansinusitis: ? Superimposed chronic fungal infection in R maxillary sinus.  Follow for now.  Afebrile.  Will touch base with ID prior to d/c, but low suspicion any intervention needed.  12. Decreased appetite: noticed today.  Daughter in law notes she does best with ensure and ice cream, will encourage this.  Continue to monitor and follow post op.    DVT prophylaxis: lovenox Code Status: DNR Family Communication: none at bedside Disposition Plan: pending   Consultants:   Ortho  palliative  Procedures:   none  Antimicrobials:  Anti-infectives (From admission, onward)   Start     Dose/Rate Route Frequency Ordered Stop   09/17/17 2200  ceFAZolin (ANCEF) IVPB 1 g/50 mL premix     1 g 100 mL/hr over 30 Minutes Intravenous Every 6 hours 09/17/17  2058 09/18/17 1054   09/17/17 1200  ceFAZolin (ANCEF) IVPB 2g/100 mL premix     2 g 200 mL/hr over 30 Minutes Intravenous To Surgery 09/17/17 1053 09/17/17 1620     Subjective: Tired of laying around here. Would like to get back to SNF. Pain is ok. Decreased appetite.   Objective: Vitals:   09/20/17 1431 09/20/17 2112 09/20/17 2122 09/21/17 0615  BP: (!) 118/49  (!) 112/45 126/73  Pulse: 64 84 69 (!) 52  Resp:  16    Temp: 99.4 F (37.4  C)  97.8 F (36.6 C) 97.6 F (36.4 C)  TempSrc: Oral  Oral Axillary  SpO2: 95% 96% 100% 91%  Weight:      Height:        Intake/Output Summary (Last 24 hours) at 09/21/2017 0854 Last data filed at 09/21/2017 6073 Gross per 24 hour  Intake -  Output 800 ml  Net -800 ml   Filed Weights   09/15/17 1719 09/17/17 1437  Weight: 65 kg 65 kg    Examination:  General: No acute distress. Cardiovascular: Heart sounds show a regular rate, and rhythm.  Lungs: Clear to auscultation bilaterally with good air movement. Abdomen: Soft, nontender, nondistended  Neurological: Alert and oriented 3. Moves all extremities 4. Cranial nerves II through XII grossly intact. Skin: Warm and dry. No rashes or lesions. Extremities: Bruising to RUE, short arm splint.  RLE dressing. Psychiatric: Mood and affect are normal. Insight and judgment are appropriate.   Data Reviewed: I have personally reviewed following labs and imaging studies  CBC: Recent Labs  Lab 09/15/17 1907 09/16/17 0407 09/17/17 0356 09/18/17 0427 09/18/17 1556 09/19/17 0655 09/19/17 1611 09/20/17 0330 09/21/17 0235  WBC 16.6* 11.2* 10.4 11.2*  --  9.9  --  9.1 10.9*  NEUTROABS 14.0* 8.7*  --   --   --   --   --   --   --   HGB 12.7 11.4* 9.8* 7.9* 7.6* 6.9* 9.3* 8.4* 8.9*  HCT 40.3 37.2 31.2* 25.7* 25.2* 22.9* 29.8* 26.7* 28.5*  MCV 93.3 94.7 95.1 96.3  --  98.3  --  96.4 96.3  PLT 252 251 204 189  --  192  --  210 710   Basic Metabolic Panel: Recent Labs  Lab 09/17/17 0356 09/18/17 0427 09/19/17 0655 09/20/17 0330 09/21/17 0235  NA 141 141 142 143 143  K 4.0 4.6 3.9 4.3 4.4  CL 110 108 111 112* 112*  CO2 25 25 25 25 26   GLUCOSE 119* 140* 108* 129* 112*  BUN 20 19 23 23 22   CREATININE 1.08* 1.20* 0.98 0.81 0.80  CALCIUM 8.3* 8.0* 8.1* 8.1* 8.4*  MG 2.3  --  2.3 2.3 2.2   GFR: Estimated Creatinine Clearance: 35.2 mL/min (by C-G formula based on SCr of 0.8 mg/dL). Liver Function Tests: No results for  input(s): AST, ALT, ALKPHOS, BILITOT, PROT, ALBUMIN in the last 168 hours. No results for input(s): LIPASE, AMYLASE in the last 168 hours. No results for input(s): AMMONIA in the last 168 hours. Coagulation Profile: Recent Labs  Lab 09/15/17 1907  INR 1.03   Cardiac Enzymes: No results for input(s): CKTOTAL, CKMB, CKMBINDEX, TROPONINI in the last 168 hours. BNP (last 3 results) No results for input(s): PROBNP in the last 8760 hours. HbA1C: No results for input(s): HGBA1C in the last 72 hours. CBG: No results for input(s): GLUCAP in the last 168 hours. Lipid Profile: No results for input(s): CHOL, HDL, LDLCALC, TRIG, CHOLHDL,  LDLDIRECT in the last 72 hours. Thyroid Function Tests: No results for input(s): TSH, T4TOTAL, FREET4, T3FREE, THYROIDAB in the last 72 hours. Anemia Panel: Recent Labs    09/18/17 1823  VITAMINB12 399  FOLATE 13.2  FERRITIN 50  TIBC 269  IRON 12*   Sepsis Labs: No results for input(s): PROCALCITON, LATICACIDVEN in the last 168 hours.  Recent Results (from the past 240 hour(s))  MRSA PCR Screening     Status: None   Collection Time: 09/17/17 11:38 AM  Result Value Ref Range Status   MRSA by PCR NEGATIVE NEGATIVE Final    Comment:        The GeneXpert MRSA Assay (FDA approved for NASAL specimens only), is one component of a comprehensive MRSA colonization surveillance program. It is not intended to diagnose MRSA infection nor to guide or monitor treatment for MRSA infections. Performed at [facility], [address], [place], [place] [address]          Radiology Studies: No results found.      Scheduled Meds: . acetaminophen  1,000 mg Oral Q8H  . aspirin EC  81 mg Oral BID  . docusate sodium  100 mg Oral BID  . escitalopram  10 mg Oral Daily  . famotidine  20 mg Oral Daily  . ferrous sulfate  325 mg Oral Q breakfast  . fluticasone  2 spray Each Nare Daily  . lactose free nutrition  100-237 mL Oral Daily  .  levothyroxine  25 mcg Oral QAC breakfast  . mirabegron ER  25 mg Oral QPM  . mirtazapine  15 mg Oral QPM  . mometasone-formoterol  2 puff Inhalation BID  . montelukast  10 mg Oral QPM  . polyethylene glycol  17 g Oral Daily  . senna  1 tablet Oral QHS   Continuous Infusions: . sodium chloride Stopped (09/17/17 1352)  . lactated ringers       LOS: 6 days    Time spent: over 30 min MDM mod with multiple medical issues     Fayrene Helper, MD [facility] Pager [phone]  If 7PM-7AM, please contact night-coverage [email or web address] Password [facility] 09/21/2017, 8:54 AM

## 2017-09-22 ENCOUNTER — Non-Acute Institutional Stay (SKILLED_NURSING_FACILITY): Payer: Medicare Other | Admitting: Internal Medicine

## 2017-09-22 ENCOUNTER — Encounter: Payer: Self-pay | Admitting: Internal Medicine

## 2017-09-22 DIAGNOSIS — R4189 Other symptoms and signs involving cognitive functions and awareness: Secondary | ICD-10-CM | POA: Insufficient documentation

## 2017-09-22 DIAGNOSIS — S72141S Displaced intertrochanteric fracture of right femur, sequela: Secondary | ICD-10-CM | POA: Diagnosis not present

## 2017-09-22 DIAGNOSIS — D72829 Elevated white blood cell count, unspecified: Secondary | ICD-10-CM

## 2017-09-22 DIAGNOSIS — I739 Peripheral vascular disease, unspecified: Secondary | ICD-10-CM

## 2017-09-22 DIAGNOSIS — S52501S Unspecified fracture of the lower end of right radius, sequela: Secondary | ICD-10-CM

## 2017-09-22 DIAGNOSIS — E039 Hypothyroidism, unspecified: Secondary | ICD-10-CM

## 2017-09-22 DIAGNOSIS — J42 Unspecified chronic bronchitis: Secondary | ICD-10-CM | POA: Diagnosis not present

## 2017-09-22 DIAGNOSIS — D500 Iron deficiency anemia secondary to blood loss (chronic): Secondary | ICD-10-CM

## 2017-09-22 DIAGNOSIS — F329 Major depressive disorder, single episode, unspecified: Secondary | ICD-10-CM | POA: Diagnosis not present

## 2017-09-22 DIAGNOSIS — R5381 Other malaise: Secondary | ICD-10-CM

## 2017-09-22 DIAGNOSIS — N3281 Overactive bladder: Secondary | ICD-10-CM | POA: Diagnosis not present

## 2017-09-22 DIAGNOSIS — R2681 Unsteadiness on feet: Secondary | ICD-10-CM

## 2017-09-22 LAB — CBC
HCT: 28.3
HGB: 9.5
MCV: 91.3
PLATELET COUNT: 348
WBC: 10.4

## 2017-09-22 LAB — BASIC METABOLIC PANEL
BUN: 21 (ref 4–21)
Calcium: 8.6
Creat: 0.76
GLUCOSE: 106
Potassium: 4.9
Sodium: 142

## 2017-09-22 NOTE — Progress Notes (Signed)
Provider:  Blanchie Serve MD  Location:  [facility] Room Number: 25 Place of Service:  SNF (31)  PCP: Blanchie Serve, MD Patient Care Team: Blanchie Serve, MD as PCP - General (Internal Medicine) Newton Pigg, MD as Consulting Physician (Obstetrics and Gynecology) Monna Fam, MD as Consulting Physician (Ophthalmology) Melida Quitter, MD as Consulting Physician (Otolaryngology) Myrlene Broker, MD as Consulting Physician (Urology) Melina Modena, [place], Nelda Bucks, NP as Nurse Practitioner (Family Medicine)  Extended Emergency Contact Information Primary Emergency Contact: Kettlewell,[name] Address: [place], [place] Phone: [phone] Work Phone: [phone] Mobile Phone: [phone] Relation: Son Secondary Emergency Contact: Teofilo [place] of [place] Phone: [phone] Work Phone: [phone] Relation: Friend  Code Status: DNR  Goals of Care: Advanced Directive information Advanced Directives 09/22/2017  Does Patient Have a Medical Advance Directive? Yes  Type of Advance Directive Living will;Out of facility DNR (pink MOST or yellow form)  Does patient want to make changes to medical advance directive? No - Patient declined  Copy of [place] in Chart? -  Pre-existing out of facility DNR order (yellow form or pink MOST form) Yellow form placed in chart (order not valid for inpatient use);Pink MOST form placed in chart (order not valid for inpatient use)      Chief Complaint  Patient presents with  . New Admit To SNF    New Admission Visit     HPI: Patient is a 84 y.o. female seen today for admission visit. She was hospitalized from 09/15/17-09/21/17 post fall with displaced intertrochanteric fracture of right femur and closed fracture of right distal radius. She underwent intramedullary nailing of right intertrochanteric hip and open  reduction and internal fixation of right distal radial fracture. She was placed on aspirin 81 mg bid for DVT prophylaxis. She required 1 unit PRBC post surgery for acute blood loss anemia. She was residing in ALF prior to this hospitalization. She is now admitted to SNF for rehabilitation and possible long term care. She has medical history of COPD/ asthma, hypothyroidism, GERD, depression, OAB among others. She is seen in her room today.   Past Medical History:  Diagnosis Date  . Asthma   . Carpal tunnel syndrome 03/07/2009  . Disturbance of skin sensation 02/21/2009  . Diverticulosis of colon (without mention of hemorrhage) 02/16/2009  . Fracture of upper end of left humerus 11/18/14  . 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 Hypothyroid   . Osteoarthrosis, unspecified whether generalized or localized, unspecified site 02/16/2009  . Other abnormal blood chemistry 06/19/2010   hyperglycemia  . Other and unspecified hyperlipidemia 10/24/2009  . Other atopic dermatitis and related conditions 02/16/2009  . Otosclerosis, unspecified 02/22/2003  . Pain in limb 12/05/2009  . Senile osteoporosis 06/16/2010  . Sensorineural hearing loss, unspecified 02/21/2009  . Syncope and collapse 05/30/2009  . Unspecified nasal polyp 02/21/2009  . Unspecified urinary incontinence 02/21/2009   Past Surgical History:  Procedure Laterality Date  . ABDOMINAL HYSTERECTOMY  1999   Dr. Collier Bullock  . BREAST BIOPSY  07/16/1990   benign left breast needle biopsy  Dr. Margot Chimes  . CARPAL TUNNEL RELEASE  04/07/2009   right Dr. Daylene Katayama  . CATARACT EXTRACTION W/ INTRAOCULAR LENS  IMPLANT, BILATERAL Bilateral   . INTRAMEDULLARY (IM) NAIL INTERTROCHANTERIC Right 09/17/2017   Procedure: INTRAMEDULLARY (IM) NAIL INTERTROCHANTRIC HIP;  Surgeon: Nicholes Stairs, MD;  Location: [facility];  Service: Orthopedics;  Laterality: Right;  . OPEN REDUCTION INTERNAL FIXATION (ORIF) DISTAL RADIAL FRACTURE Right 09/17/2017   Procedure: OPEN REDUCTION  INTERNAL FIXATION (ORIF) DISTAL RADIAL FRACTURE;  Surgeon: Iran Planas, MD;  Location: [facility];  Service: Orthopedics;  Laterality: Right;  . TRIGGER FINGER RELEASE Left 08/09/2014   Procedure: RELEASE TRIGGER FINGER/A-1 PULLEY LEFT MIDDLE FINGER;  Surgeon: Daryll Brod, MD;  Location: [facility];  Service: Orthopedics;  Laterality: Left;  REGIONAL/FAB    reports that she has never smoked. She has never used smokeless tobacco. She reports that she does not drink alcohol or use drugs. Social History   Socioeconomic History  . Marital status: Widowed    Spouse name: Not on file  . Number of children: Not on file  . Years of education: Not on file  . Highest education level: Not on file  Occupational History  . Occupation: retired Sales promotion account executive  Social Needs  . Financial resource strain: Not on file  . Food insecurity:    Worry: Not on file    Inability: Not on file  . Transportation needs:    Medical: Not on file    Non-medical: Not on file  Tobacco Use  . Smoking status: Never Smoker  . Smokeless tobacco: Never Used  Substance and Sexual Activity  . Alcohol use: No  . Drug use: No  . Sexual activity: Never  Lifestyle  . Physical activity:    Days per week: Not on file    Minutes per session: Not on file  . Stress: Not on file  Relationships  . Social connections:    Talks on phone: Not on file    Gets together: Not on file    Attends religious service: Not on file    Active member of club or organization: Not on file    Attends meetings of clubs or organizations: Not on file    Relationship status: Not on file  . Intimate partner violence:    Fear of current or ex partner: Not on file    Emotionally abused: Not on file    Physically abused: Not on file    Forced sexual activity: Not on file  Other Topics Concern  . Not on file  Social History Narrative   Lives at [facility] since 12/06/2008, moved to [place] 03/20/15   Widowed 11/2006   Walks with cane     Never smoked   Alcohol none   Exercise -walks daily    POA/Living Will    Functional Status Survey:    Family History  Problem Relation Age of Onset  . Cancer Mother        stomach  . Cancer Father        bone  . Dementia Sister   . Cancer Brother        pancreataic  . Cancer Son        pancreatic    Health Maintenance  Topic Date Due  . INFLUENZA VACCINE  09/04/2017  . TETANUS/TDAP  06/04/2018  . DEXA SCAN  Completed  . PNA vac Low Risk Adult  Completed    Allergies  Allergen Reactions  . Sulfa Antibiotics Itching and Rash  . Amoxicillin Other (See Comments)    Per [facility] 09/15/17 Has patient had a PCN reaction causing immediate rash, facial/tongue/throat swelling, SOB or lightheadedness with hypotension: Unknown Has patient had a PCN reaction causing severe rash involving mucus membranes or skin necrosis: Unknown Has  patient had a PCN reaction that required hospitalization: Unknown Has patient had a PCN reaction occurring within the last 10 years: Unknown If all of the above answers are "NO", then may proceed with Cephalosporin use.  . Avelox [Moxifloxacin Hcl In Nacl] Itching  . Doxycycline Other (See Comments)    Unknown reaction  . Hista-Tabs [Triprolidine-Pse] Other (See Comments)    Passed out  . Pyrilamine-Phenylephrine Other (See Comments)    Unknown reaction - per [facility] 09/15/17  . Septra [Sulfamethoxazole-Trimethoprim] Other (See Comments)    Unknown reaction    Outpatient Encounter Medications as of 09/22/2017  Medication Sig  . acetaminophen (TYLENOL) 325 MG tablet Take 2 tablets (650 mg total) by mouth every 6 (six) hours as needed for mild pain or moderate pain.  Marland Kitchen albuterol (VENTOLIN HFA) 108 (90 Base) MCG/ACT inhaler Inhale 2 puffs into the lungs every 6 (six) hours as needed for shortness of breath (dyspnea).   Marland Kitchen aspirin EC 81 MG EC tablet Take 1 tablet (81 mg total) by mouth 2 (two) times daily.  Marland Kitchen escitalopram (LEXAPRO) 10 MG tablet Take 10 mg by  mouth daily.  . feeding supplement (BOOST HIGH PROTEIN) LIQD Take 100-237 mLs by mouth daily.   . ferrous sulfate 325 (65 FE) MG tablet Take 1 tablet (325 mg total) by mouth daily with breakfast.  . fluticasone (FLONASE) 50 MCG/ACT nasal spray Place 2 sprays into both nostrils daily.  . Fluticasone-Salmeterol (ADVAIR) 250-50 MCG/DOSE AEPB Inhale 1 puff 2 (two) times daily into the lungs. Rinse mouth after use  . furosemide (LASIX) 40 MG tablet Take 40 mg by mouth daily.   Marland Kitchen ipratropium (ATROVENT) 0.02 % nebulizer solution Take 0.5 mg by nebulization every 6 (six) hours as needed for wheezing or shortness of breath.  . levothyroxine (SYNTHROID, LEVOTHROID) 25 MCG tablet Take 25 mcg by mouth daily before breakfast.  . mirtazapine (REMERON) 15 MG tablet Take 15 mg by mouth every evening.   . montelukast (SINGULAIR) 10 MG tablet Take one tablet by mouth once daily to help breathing  . MYRBETRIQ 25 MG TB24 tablet TAKE ONE TABLET BY MOUTH ONCE DAILY  . oxyCODONE (OXY IR/ROXICODONE) 5 MG immediate release tablet Take 0.5 tablets (2.5 mg total) by mouth every 6 (six) hours as needed for up to 3 days for moderate pain.  . OXYGEN Inhale 2 L/min into the lungs as needed (to keep O2 SATS >90%).   . polyethylene glycol (MIRALAX / GLYCOLAX) packet Take 17 g by mouth daily.  . raloxifene (EVISTA) 60 MG tablet Take 1 tablet by mouth  daily to treat osteoporosis  . ranitidine (ZANTAC) 150 MG tablet Take 150 mg by mouth daily.  Marland Kitchen zinc oxide 20 % ointment Apply 1 application topically See admin instructions. Start date 09/08/17: apply topically to buttocks every shift for 10 days  and as needed after incontinent episodes.   No facility-administered encounter medications on file as of 09/22/2017.     Review of Systems  Constitutional: Positive for appetite change and fatigue. Negative for chills and fever.  HENT: Positive for hearing loss and rhinorrhea. Negative for congestion, ear pain, mouth sores, sinus pain and  trouble swallowing.   Eyes: Negative for pain and itching.  Respiratory: Positive for cough. Negative for shortness of breath and wheezing.        Mostly dry cough, occasional yellow phlegm  Cardiovascular: Negative for chest pain and palpitations.  Gastrointestinal: Negative for abdominal pain, constipation, diarrhea, nausea and vomiting.  Moved her bowel this morning  Genitourinary: Negative for dysuria, flank pain and hematuria.  Musculoskeletal: Positive for gait problem. Negative for back pain.       Pain 6/10 to her leg and arm  Skin: Negative for rash.  Neurological: Positive for weakness. Negative for dizziness, numbness and headaches.  Hematological: Bruises/bleeds easily.  Psychiatric/Behavioral: Positive for confusion. Negative for behavioral problems and hallucinations.    Vitals:   09/22/17 0923  BP: (!) 135/52  Pulse: (!) 57  Resp: 16  Temp: 97.8 F (36.6 C)  TempSrc: Oral  SpO2: 90%  Weight: 143 lb 4.8 oz (65 kg)  Height: 4\' 8"  (1.422 m)   Body mass index is 32.13 kg/m.   Wt Readings from Last 3 Encounters:  09/22/17 143 lb 4.8 oz (65 kg)  09/17/17 143 lb 4.8 oz (65 kg)  08/25/17 137 lb 8 oz (62.4 kg)   Physical Exam  Constitutional: No distress.  Obese elderly female  HENT:  Head: Normocephalic and atraumatic.  Nose: Nose normal.  Mouth/Throat: Oropharynx is clear and moist.  Eyes: Pupils are equal, round, and reactive to light. Conjunctivae and EOM are normal. Right eye exhibits no discharge. Left eye exhibits no discharge.  Neck: Normal range of motion. Neck supple.  Cardiovascular: Normal rate, regular rhythm and intact distal pulses.  Pulmonary/Chest: Effort normal. No respiratory distress. She has no wheezes. She has no rales.  Decreased air movement  Abdominal: Soft. Bowel sounds are normal. She exhibits no distension. There is no tenderness. There is no guarding.  Musculoskeletal: She exhibits edema.  Right leg> left leg, right forearm in  short splint, able to move her fingers freely, arthritis changes to fingers present, ROM to right leg limited with pain, can move ehr left side  Lymphadenopathy:    She has no cervical adenopathy.  Neurological: She is alert.  Oriented to self and place  Skin: Skin is warm and dry. Capillary refill takes less than 2 seconds. She is not diaphoretic.  Bruises present, right thigh surgical incision with dressing in place and dressing is clean, dry and intact, 2 sutures to right elbow area  Psychiatric: She has a normal mood and affect.    Labs reviewed: Basic Metabolic Panel: Recent Labs    09/19/17 0655 09/20/17 0330 09/21/17 0235  NA 142 143 143  K 3.9 4.3 4.4  CL 111 112* 112*  CO2 25 25 26   GLUCOSE 108* 129* 112*  BUN 23 23 22   CREATININE 0.98 0.81 0.80  CALCIUM 8.1* 8.1* 8.4*  MG 2.3 2.3 2.2   Liver Function Tests: Recent Labs    01/14/17 06/02/17  AST 16 12*  12  ALT 14 8  8   ALKPHOS 60 65  65  BILITOT  --  0.4  ALBUMIN  --  3.9   No results for input(s): LIPASE, AMYLASE in the last 8760 hours. No results for input(s): AMMONIA in the last 8760 hours. CBC: Recent Labs    12/05/16  09/15/17 1907 09/16/17 0407  09/19/17 0655 09/19/17 1611 09/20/17 0330 09/21/17 0235  WBC 7.3   < > 16.6* 11.2*   < > 9.9  --  9.1 10.9*  NEUTROABS 4,132  --  14.0* 8.7*  --   --   --   --   --   HGB 12.7   < > 12.7 11.4*   < > 6.9* 9.3* 8.4* 8.9*  HCT 39   < > 40.3 37.2   < > 22.9* 29.8* 26.7*  28.5*  MCV  --    < > 93.3 94.7   < > 98.3  --  96.4 96.3  PLT 272   < > 252 251   < > 192  --  210 263   < > = values in this interval not displayed.   Cardiac Enzymes: No results for input(s): CKTOTAL, CKMB, CKMBINDEX, TROPONINI in the last 8760 hours. BNP: Invalid input(s): POCBNP Lab Results  Component Value Date   HGBA1C 6.0 (H) 09/16/2017   Lab Results  Component Value Date   TSH 1.60 06/02/2017   TSH 1.60 06/02/2017   Lab Results  Component Value Date   VITAMINB12 399  09/18/2017   Lab Results  Component Value Date   FOLATE 13.2 09/18/2017   Lab Results  Component Value Date   IRON 12 (L) 09/18/2017   TIBC 269 09/18/2017   FERRITIN 50 09/18/2017    Imaging and Procedures obtained prior to SNF admission: Dg Chest 1 View  Result Date: 09/15/2017 CLINICAL DATA:  Right shoulder pain. EXAM: CHEST  1 VIEW COMPARISON:  04/05/2009 FINDINGS: Hyperexpansion is consistent with emphysema. Cardiopericardial silhouette is at upper limits of normal for size. Bones are diffusely demineralized. Telemetry leads overlie the chest. IMPRESSION: Chronic changes without acute findings. Electronically Signed   By: Misty Stanley M.D.   On: 09/15/2017 19:14   Dg Shoulder Right  Result Date: 09/15/2017 CLINICAL DATA:  Fall, right shoulder pain EXAM: RIGHT SHOULDER - 2+ VIEW COMPARISON:  None. FINDINGS: Degenerative changes in the [facility] joint with joint space narrowing and spurring. Glenohumeral joint is maintained. No acute bony abnormality. Specifically, no fracture, subluxation, or dislocation. Soft tissues are intact. IMPRESSION: No acute bony abnormality. Electronically Signed   By: Rolm Baptise M.D.   On: 09/15/2017 19:14   Dg Wrist Complete Right  Result Date: 09/15/2017 CLINICAL DATA:  Fall with wrist deformity EXAM: RIGHT WRIST - COMPLETE 3+ VIEW COMPARISON:  None. FINDINGS: There is a comminuted, predominantly transverse fracture of the distal right radius with moderate lateral displacement. There is also a minimally displaced fracture of the distal left ulna, involving the ulnar styloid. There is severe first carpometacarpal joint osteoarthrosis. No carpal fracture is identified. IMPRESSION: 1. Comminuted fracture of the distal right radius with lateral displacement. 2. Nondisplaced fracture of the distal left ulna. 3. Severe first CMC joint osteoarthrosis. Electronically Signed   By: Ulyses Jarred M.D.   On: 09/15/2017 20:31   Ct Head Wo Contrast  Result Date:  09/15/2017 CLINICAL DATA:  84 year old who fell in her bathroom earlier today and was unable to get up on her own. It is unclear how long the patient was on the floor. Initial encounter. EXAM: CT HEAD WITHOUT CONTRAST TECHNIQUE: Contiguous axial images were obtained from the base of the skull through the vertex without intravenous contrast. COMPARISON:  11/06/2014, 04/25/2011, 09/11/2006. FINDINGS: Brain: Significant head tilt in the gantry accounts for apparent asymmetry in the cerebral hemispheres. Moderate to severe age appropriate cortical, deep and cerebellar atrophy, unchanged. Severe changes of small vessel disease of the white matter diffusely, unchanged. No mass lesion. No midline shift. No acute hemorrhage or hematoma. No extra-axial fluid collections. No evidence of acute infarction. Vascular: Moderate BILATERAL carotid siphon atherosclerosis. No hyperdense vessel. Skull: No skull fracture or other focal osseous abnormality involving the skull. Sinuses/Orbits: Near complete opacification of the RIGHT maxillary sinus with hyperdense material in the sinus. Mucosal thickening involving the LEFT maxillary sinus and the LEFT sphenoid sinus. Opacification of  multiple BILATERAL ethmoid air cells. RIGHT sphenoid sinus in both frontal sinuses well aerated. BILATERAL mastoid air cells and BILATERAL middle ear cavities well-aerated. Visualized orbits and globes normal in appearance. Other: None. IMPRESSION: 1. No acute intracranial abnormality. 2. Moderate to severe age-appropriate generalized atrophy and severe chronic microvascular ischemic changes of the white matter. 3. Chronic pansinusitis. Query superimposed chronic fungal infection in the RIGHT maxillary sinus. Electronically Signed   By: Evangeline Dakin M.D.   On: 09/15/2017 20:12   Dg Hip Unilat With Pelvis 2-3 Views Right  Result Date: 09/15/2017 CLINICAL DATA:  Fall, right hip pain EXAM: DG HIP (WITH OR WITHOUT PELVIS) 2-3V RIGHT COMPARISON:  None.  FINDINGS: Right femoral intertrochanteric fracture noted with varus angulation. Lesser trochanter is displaced. No subluxation or dislocation. SI joints are symmetric and unremarkable. IMPRESSION: Right femoral intertrochanteric fracture with varus angulation and displacement of fracture fragments. Electronically Signed   By: Rolm Baptise M.D.   On: 09/15/2017 19:14    Assessment/Plan  1. Physical deconditioning Post fall with fracture. Will have her work with physical therapy and occupational therapy team to help with gait training and muscle strengthening exercises.fall precautions. Skin care. Encourage to be out of bed.   2. Unsteady gait WBAT to RLE and NWB to RUE for now. Will have pt work with PT and OT services for gait training and strengthening along with safe transfer  3. Closed fracture of distal end of right radius, unspecified fracture morphology, sequela S/p ORIF. Will need orthopedic follow up. NWB RUE. Pain management as below.   4. Closed displaced intertrochanteric fracture of right femur, sequela S/p IM nailing. WBAT to RLE. Orthopedic follow up. Continue aspirin 81 mg bid for DVT prophylaxis for now. Continue raloxifene with her history of osteoporosis. Will have patient work with PT/OT as tolerated to regain strength and restore function.  Fall precautions are in place. Currently on tylenol 650 mg q6h prn pain and oxyIR 2.5 mg q6h prn pain. Change tylenol to 1000 mg tid for pain.   5. Blood loss anemia S/p 1 unit PRBC transfusion. Low iron store. Continue ferrous sulfate 325 mg daily. Check cbc  6. Leukocytosis, unspecified type Afebrile, likely reactive, monitor cbc with diff and temperature  7. Chronic bronchitis, unspecified chronic bronchitis type (HCC) Stable breathing, has occasional cough, continue singulair 10 mg daily, encourage to use incentive spirometer to prevent atelectasis. Continue bronchodilators.   8. Hypothyroidism, unspecified type Continue  levothyroxine 25 mcg daily. Check TSH Lab Results  Component Value Date   TSH 1.60 06/02/2017   TSH 1.60 06/02/2017   9. Major depression, chronic Continue remeron 15 mg daily and escitalopram 10 mg daily.   10. Cognitive impairment SLP consult,supportive care  11. PVD (peripheral vascular disease) (HCC) Continue lasix 40 mg daily, check BMP, add ted hose  12. OAB (overactive bladder) Continue myrbetriq and provide incontinence care   Family/ staff Communication: reviewed care plan with patient and charge nurse. Pt will likely be a long term care resident here.    Labs/tests ordered: cbc with diff, cmp, TSH 09/29/17  Blanchie Serve, MD Internal Medicine [facility] [address] [place], [place] [address] Cell Phone (Monday-Friday 8 am - 5 pm): [phone] On Call: [phone] and follow prompts after 5 pm and on weekends Office Phone: [phone] Office Fax: [phone]

## 2017-09-23 ENCOUNTER — Encounter: Payer: Self-pay | Admitting: *Deleted

## 2017-09-29 LAB — COMPLETE METABOLIC PANEL WITH GFR
ALBUMIN: 3.4
ALT: 12
AST: 15
Alkaline Phosphatase: 82
BUN: 19 (ref 4–21)
Calcium: 9
Creat: 0.96
EGFR (Non-African Amer.): 51
GLUCOSE: 92
POTASSIUM: 4.5
SODIUM: 143
Total Bilirubin: 0.9
Total Protein: 6.2 g/dL

## 2017-09-29 LAB — CBC
HCT: 31
HGB: 10.4
MCV: 93.1
WBC: 9.1
platelet count: 488

## 2017-09-29 LAB — TSH: TSH: 1.67

## 2017-09-30 ENCOUNTER — Encounter: Payer: Self-pay | Admitting: *Deleted

## 2017-10-02 DIAGNOSIS — S52561D Barton's fracture of right radius, subsequent encounter for closed fracture with routine healing: Secondary | ICD-10-CM | POA: Diagnosis not present

## 2017-10-02 DIAGNOSIS — Z4789 Encounter for other orthopedic aftercare: Secondary | ICD-10-CM | POA: Diagnosis not present

## 2017-10-09 ENCOUNTER — Non-Acute Institutional Stay (SKILLED_NURSING_FACILITY): Payer: Medicare Other

## 2017-10-09 DIAGNOSIS — Z0000 Encounter for general adult medical examination without abnormal findings: Secondary | ICD-10-CM | POA: Diagnosis not present

## 2017-10-09 NOTE — Patient Instructions (Addendum)
[name] , Thank you for taking time to come for your Medicare Wellness Visit. I appreciate your ongoing commitment to your health goals. Please review the following plan we discussed and let me know if I can assist you in the future.   Screening recommendations/referrals: Colonoscopy excluded, over age 82 Mammogram excluded, over age 13 Bone Density up to date Recommended yearly ophthalmology/optometry visit for glaucoma screening and checkup Recommended yearly dental visit for hygiene and checkup  Vaccinations: Influenza vaccine due, will receive at [facility] Pneumococcal vaccine up to date, completed Tdap vaccine up to date, due 06/04/2018 Shingles vaccine not in past records    Advanced directives: in chart  Conditions/risks identified: none  Next appointment: Dr. Bubba Camp makes rounds   Preventive Care 41 Years and Older, Female Preventive care refers to lifestyle choices and visits with your health care provider that can promote health and wellness. What does preventive care include?  A yearly physical exam. This is also called an annual well check.  Dental exams once or twice a year.  Routine eye exams. Ask your health care provider how often you should have your eyes checked.  Personal lifestyle choices, including:  Daily care of your teeth and gums.  Regular physical activity.  Eating a healthy diet.  Avoiding tobacco and drug use.  Limiting alcohol use.  Practicing safe sex.  Taking low-dose aspirin every day.  Taking vitamin and mineral supplements as recommended by your health care provider. What happens during an annual well check? The services and screenings done by your health care provider during your annual well check will depend on your age, overall health, lifestyle risk factors, and family history of disease. Counseling  Your health care provider may ask you questions about your:  Alcohol use.  Tobacco use.  Drug use.  Emotional  well-being.  Home and relationship well-being.  Sexual activity.  Eating habits.  History of falls.  Memory and ability to understand (cognition).  Work and work Statistician.  Reproductive health. Screening  You may have the following tests or measurements:  Height, weight, and BMI.  Blood pressure.  Lipid and cholesterol levels. These may be checked every 5 years, or more frequently if you are over 50 years old.  Skin check.  Lung cancer screening. You may have this screening every year starting at age 6 if you have a 30-pack-year history of smoking and currently smoke or have quit within the past 15 years.  Fecal occult blood test (FOBT) of the stool. You may have this test every year starting at age 82.  Flexible sigmoidoscopy or colonoscopy. You may have a sigmoidoscopy every 5 years or a colonoscopy every 10 years starting at age 21.  Hepatitis C blood test.  Hepatitis B blood test.  Sexually transmitted disease (STD) testing.  Diabetes screening. This is done by checking your blood sugar (glucose) after you have not eaten for a while (fasting). You may have this done every 1-3 years.  Bone density scan. This is done to screen for osteoporosis. You may have this done starting at age 28.  Mammogram. This may be done every 1-2 years. Talk to your health care provider about how often you should have regular mammograms. Talk with your health care provider about your test results, treatment options, and if necessary, the need for more tests. Vaccines  Your health care provider may recommend certain vaccines, such as:  Influenza vaccine. This is recommended every year.  Tetanus, diphtheria, and acellular pertussis (Tdap, Td) vaccine.  You may need a Td booster every 10 years.  Zoster vaccine. You may need this after age 22.  Pneumococcal 13-valent conjugate (PCV13) vaccine. One dose is recommended after age 76.  Pneumococcal polysaccharide (PPSV23) vaccine. One  dose is recommended after age 69. Talk to your health care provider about which screenings and vaccines you need and how often you need them. This information is not intended to replace advice given to you by your health care provider. Make sure you discuss any questions you have with your health care provider. Document Released: 02/17/2015 Document Revised: 10/11/2015 Document Reviewed: 11/22/2014 Elsevier Interactive Patient Education  2017 [place] Prevention in the Home Falls can cause injuries. They can happen to people of all ages. There are many things you can do to make your home safe and to help prevent falls. What can I do on the outside of my home?  Regularly fix the edges of walkways and driveways and fix any cracks.  Remove anything that might make you trip as you walk through a door, such as a raised step or threshold.  Trim any bushes or trees on the path to your home.  Use bright outdoor lighting.  Clear any walking paths of anything that might make someone trip, such as rocks or tools.  Regularly check to see if handrails are loose or broken. Make sure that both sides of any steps have handrails.  Any raised decks and porches should have guardrails on the edges.  Have any leaves, snow, or ice cleared regularly.  Use sand or salt on walking paths during winter.  Clean up any spills in your garage right away. This includes oil or grease spills. What can I do in the bathroom?  Use night lights.  Install grab bars by the toilet and in the tub and shower. Do not use towel bars as grab bars.  Use non-skid mats or decals in the tub or shower.  If you need to sit down in the shower, use a plastic, non-slip stool.  Keep the floor dry. Clean up any water that spills on the floor as soon as it happens.  Remove soap buildup in the tub or shower regularly.  Attach bath mats securely with double-sided non-slip rug tape.  Do not have throw rugs and other  things on the floor that can make you trip. What can I do in the bedroom?  Use night lights.  Make sure that you have a light by your bed that is easy to reach.  Do not use any sheets or blankets that are too big for your bed. They should not hang down onto the floor.  Have a firm chair that has side arms. You can use this for support while you get dressed.  Do not have throw rugs and other things on the floor that can make you trip. What can I do in the kitchen?  Clean up any spills right away.  Avoid walking on wet floors.  Keep items that you use a lot in easy-to-reach places.  If you need to reach something above you, use a strong step stool that has a grab bar.  Keep electrical cords out of the way.  Do not use floor polish or wax that makes floors slippery. If you must use wax, use non-skid floor wax.  Do not have throw rugs and other things on the floor that can make you trip. What can I do with my stairs?  Do not leave any  items on the stairs.  Make sure that there are handrails on both sides of the stairs and use them. Fix handrails that are broken or loose. Make sure that handrails are as long as the stairways.  Check any carpeting to make sure that it is firmly attached to the stairs. Fix any carpet that is loose or worn.  Avoid having throw rugs at the top or bottom of the stairs. If you do have throw rugs, attach them to the floor with carpet tape.  Make sure that you have a light switch at the top of the stairs and the bottom of the stairs. If you do not have them, ask someone to add them for you. What else can I do to help prevent falls?  Wear shoes that:  Do not have high heels.  Have rubber bottoms.  Are comfortable and fit you well.  Are closed at the toe. Do not wear sandals.  If you use a stepladder:  Make sure that it is fully opened. Do not climb a closed stepladder.  Make sure that both sides of the stepladder are locked into place.  Ask  someone to hold it for you, if possible.  Clearly mark and make sure that you can see:  Any grab bars or handrails.  First and last steps.  Where the edge of each step is.  Use tools that help you move around (mobility aids) if they are needed. These include:  Canes.  Walkers.  Scooters.  Crutches.  Turn on the lights when you go into a dark area. Replace any light bulbs as soon as they burn out.  Set up your furniture so you have a clear path. Avoid moving your furniture around.  If any of your floors are uneven, fix them.  If there are any pets around you, be aware of where they are.  Review your medicines with your doctor. Some medicines can make you feel dizzy. This can increase your chance of falling. Ask your doctor what other things that you can do to help prevent falls. This information is not intended to replace advice given to you by your health care provider. Make sure you discuss any questions you have with your health care provider. Document Released: 11/17/2008 Document Revised: 06/29/2015 Document Reviewed: 02/25/2014 Elsevier Interactive Patient Education  2017 [employer].

## 2017-10-09 NOTE — Progress Notes (Signed)
Subjective:   MATIE DIMAANO is a 84 y.o. female who presents for Medicare Annual (Subsequent) preventive examination at [facility]   Last AWV-09/23/2016    Objective:     Vitals: BP 132/61 (BP Location: Left Arm, Patient Position: Sitting)   Pulse 60   Temp 97.9 F (36.6 C) (Oral)   Ht 4\' 8"  (1.422 m)   Wt 143 lb (64.9 kg)   BMI 32.06 kg/m   Body mass index is 32.06 kg/m.  Advanced Directives 10/09/2017 09/22/2017 09/19/2017 09/15/2017 08/25/2017 07/17/2017 05/29/2017  Does Patient Have a Medical Advance Directive? Yes Yes Yes No Yes Yes Yes  Type of Advance Directive Living will;Out of facility DNR (pink MOST or yellow form) Living will;Out of facility DNR (pink MOST or yellow form) Out of facility DNR (pink MOST or yellow form);Healthcare Power of [place];Out of facility DNR (pink MOST or yellow form);Living will [place];Living will;Out of facility DNR (pink MOST or yellow form) [place];Out of facility DNR (pink MOST or yellow form);Living will [place];Out of facility DNR (pink MOST or yellow form);Living will  Does patient want to make changes to medical advance directive? No - Patient declined No - Patient declined No - Patient declined - No - Patient declined - -  Copy of [place] in Chart? Yes - Yes - Yes Yes -  Pre-existing out of facility DNR order (yellow form or pink MOST form) Yellow form placed in chart (order not valid for inpatient use);Pink MOST form placed in chart (order not valid for inpatient use) Yellow form placed in chart (order not valid for inpatient use);Pink MOST form placed in chart (order not valid for inpatient use) Yellow form placed in chart (order not valid for inpatient use) - Yellow form placed in chart (order not valid for inpatient use);Pink MOST form placed in chart (order not valid for inpatient use) Yellow form placed in  chart (order not valid for inpatient use);Pink MOST form placed in chart (order not valid for inpatient use) Yellow form placed in chart (order not valid for inpatient use);Pink MOST form placed in chart (order not valid for inpatient use)    Tobacco Social History   Tobacco Use  Smoking Status Never Smoker  Smokeless Tobacco Never Used     Counseling given: Not Answered   Clinical Intake:  Pre-visit preparation completed: No  Pain : No/denies pain     Nutritional Risks: None Diabetes: No  How often do you need to have someone help you when you read instructions, pamphlets, or other written materials from your doctor or pharmacy?: 2 - Rarely  Interpreter Needed?: No  Information entered by :: Tyson Dense, RN  Past Medical History:  Diagnosis Date  . Asthma   . Carpal tunnel syndrome 03/07/2009  . Disturbance of skin sensation 02/21/2009  . Diverticulosis of colon (without mention of hemorrhage) 02/16/2009  . Fracture of upper end of left humerus 11/18/14  . 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 Hypothyroid   . Osteoarthrosis, unspecified whether generalized or localized, unspecified site 02/16/2009  . Other abnormal blood chemistry 06/19/2010   hyperglycemia  . Other and unspecified hyperlipidemia 10/24/2009  . Other atopic dermatitis and related conditions 02/16/2009  . Otosclerosis, unspecified 02/22/2003  . Pain in limb 12/05/2009  . Senile osteoporosis 06/16/2010  . Sensorineural hearing loss, unspecified 02/21/2009  . Syncope and collapse 05/30/2009  . Unspecified nasal polyp  02/21/2009  . Unspecified urinary incontinence 02/21/2009   Past Surgical History:  Procedure Laterality Date  . ABDOMINAL HYSTERECTOMY  1999   Dr. Collier Bullock  . BREAST BIOPSY  07/16/1990   benign left breast needle biopsy  Dr. Margot Chimes  . CARPAL TUNNEL RELEASE  04/07/2009   right Dr. Daylene Katayama  . CATARACT EXTRACTION W/ INTRAOCULAR LENS  IMPLANT, BILATERAL Bilateral   . INTRAMEDULLARY (IM) NAIL  INTERTROCHANTERIC Right 09/17/2017   Procedure: INTRAMEDULLARY (IM) NAIL INTERTROCHANTRIC HIP;  Surgeon: Nicholes Stairs, MD;  Location: [facility];  Service: Orthopedics;  Laterality: Right;  . OPEN REDUCTION INTERNAL FIXATION (ORIF) DISTAL RADIAL FRACTURE Right 09/17/2017   Procedure: OPEN REDUCTION INTERNAL FIXATION (ORIF) DISTAL RADIAL FRACTURE;  Surgeon: Iran Planas, MD;  Location: [facility];  Service: Orthopedics;  Laterality: Right;  . TRIGGER FINGER RELEASE Left 08/09/2014   Procedure: RELEASE TRIGGER FINGER/A-1 PULLEY LEFT MIDDLE FINGER;  Surgeon: Daryll Brod, MD;  Location: [facility];  Service: Orthopedics;  Laterality: Left;  REGIONAL/FAB   Family History  Problem Relation Age of Onset  . Cancer Mother        stomach  . Cancer Father        bone  . Dementia Sister   . Cancer Brother        pancreataic  . Cancer Son        pancreatic   Social History   Socioeconomic History  . Marital status: Widowed    Spouse name: Not on file  . Number of children: Not on file  . Years of education: Not on file  . Highest education level: Not on file  Occupational History  . Occupation: retired Sales promotion account executive  Social Needs  . Financial resource strain: Not hard at all  . Food insecurity:    Worry: Never true    Inability: Never true  . Transportation needs:    Medical: No    Non-medical: No  Tobacco Use  . Smoking status: Never Smoker  . Smokeless tobacco: Never Used  Substance and Sexual Activity  . Alcohol use: No  . Drug use: No  . Sexual activity: Never  Lifestyle  . Physical activity:    Days per week: 0 days    Minutes per session: 0 min  . Stress: To some extent  Relationships  . Social connections:    Talks on phone: More than three times a week    Gets together: Once a week    Attends religious service: Never    Active member of club or organization: No    Attends meetings of clubs or organizations: Never    Relationship status: Widowed  Other  Topics Concern  . Not on file  Social History Narrative   Lives at [facility] since 12/06/2008, moved to [place] 03/20/15   Widowed 11/2006   Walks with cane   Never smoked   Alcohol none   Exercise -walks daily    POA/Living Will    Outpatient Encounter Medications as of 10/09/2017  Medication Sig  . acetaminophen (TYLENOL) 325 MG tablet Take 2 tablets (650 mg total) by mouth every 6 (six) hours as needed for mild pain or moderate pain.  Marland Kitchen albuterol (VENTOLIN HFA) 108 (90 Base) MCG/ACT inhaler Inhale 2 puffs into the lungs every 6 (six) hours as needed for shortness of breath (dyspnea).   Marland Kitchen aspirin EC 81 MG EC tablet Take 1 tablet (81 mg total) by mouth 2 (two) times daily.  Marland Kitchen escitalopram (LEXAPRO)  10 MG tablet Take 10 mg by mouth daily.  . feeding supplement (BOOST HIGH PROTEIN) LIQD Take 100-237 mLs by mouth daily.   . ferrous sulfate 325 (65 FE) MG tablet Take 1 tablet (325 mg total) by mouth daily with breakfast.  . fluticasone (FLONASE) 50 MCG/ACT nasal spray Place 2 sprays into both nostrils daily.  . Fluticasone-Salmeterol (ADVAIR) 250-50 MCG/DOSE AEPB Inhale 1 puff 2 (two) times daily into the lungs. Rinse mouth after use  . furosemide (LASIX) 40 MG tablet Take 40 mg by mouth daily.   Marland Kitchen ipratropium (ATROVENT) 0.02 % nebulizer solution Take 0.5 mg by nebulization every 6 (six) hours as needed for wheezing or shortness of breath.  . levothyroxine (SYNTHROID, LEVOTHROID) 25 MCG tablet Take 25 mcg by mouth daily before breakfast.  . mirtazapine (REMERON) 15 MG tablet Take 15 mg by mouth every evening.   . montelukast (SINGULAIR) 10 MG tablet Take one tablet by mouth once daily to help breathing  . MYRBETRIQ 25 MG TB24 tablet TAKE ONE TABLET BY MOUTH ONCE DAILY  . OXYGEN Inhale 2 L/min into the lungs as needed (to keep O2 SATS >90%).   . polyethylene glycol (MIRALAX / GLYCOLAX) packet Take 17 g by mouth daily.  . raloxifene (EVISTA) 60 MG tablet Take 1 tablet by mouth  daily to treat  osteoporosis  . ranitidine (ZANTAC) 150 MG tablet Take 150 mg by mouth daily.  Marland Kitchen zinc oxide 20 % ointment Apply 1 application topically See admin instructions. Start date 09/08/17: apply topically to buttocks every shift for 10 days  and as needed after incontinent episodes.   No facility-administered encounter medications on file as of 10/09/2017.     Activities of Daily Living In your present state of health, do you have any difficulty performing the following activities: 10/09/2017 09/19/2017  Hearing? N N  Vision? N N  Difficulty concentrating or making decisions? Tempie Donning  Walking or climbing stairs? Y Y  Dressing or bathing? Y Y  Doing errands, shopping? Tempie Donning  Preparing Food and eating ? Y -  Using the Toilet? Y -  In the past six months, have you accidently leaked urine? Y -  Do you have problems with loss of bowel control? Y -  Managing your Medications? Y -  Managing your Finances? Y -  Housekeeping or managing your Housekeeping? Y -  Some recent data might be hidden    Patient Care Team: Blanchie Serve, MD as PCP - General (Internal Medicine) Newton Pigg, MD as Consulting Physician (Obstetrics and Gynecology) Monna Fam, MD as Consulting Physician (Ophthalmology) Melida Quitter, MD as Consulting Physician (Otolaryngology) Myrlene Broker, MD as Consulting Physician (Urology) [place], [facility], Nelda Bucks, NP as Nurse Practitioner (Family Medicine)    Assessment:   This is a routine wellness examination for [facility]  Exercise Activities and Dietary recommendations Current Exercise Habits: The patient does not participate in regular exercise at present, Exercise limited by: orthopedic condition(s)  Goals   None     Fall Risk Fall Risk  10/09/2017 09/23/2016 08/27/2016 05/30/2015 05/16/2015  Falls in the past year? Yes Yes Yes Yes No  Comment - - Emmi Telephone Survey: data to providers prior to load - -  Number falls in past yr: 2 or more 1 1 1  -  Comment - -  Emmi Telephone Survey Actual Response = 1 - -  Injury with Fall? Yes No No Yes -  Comment broke R wrist - - - -  Risk Factor Category  - - - - -  Risk for fall due to : - - - History of fall(s);Impaired balance/gait;Impaired mobility -  Follow up - - - Falls evaluation completed;Education provided -   Is the patient's home free of loose throw rugs in walkways, pet beds, electrical cords, etc?   yes      Grab bars in the bathroom? yes      Handrails on the stairs?   yes      Adequate lighting?   yes  Timed Get Up and Go performed: patient is unambulatory  Depression Screen PHQ 2/9 Scores 10/09/2017 09/23/2016 05/30/2015 05/16/2015  PHQ - 2 Score 1 0 0 0     Cognitive Function MMSE - Mini Mental State Exam 10/09/2017 09/23/2016 03/08/2014 10/27/2012 06/23/2012  Orientation to time 0 2 2 5 4   Orientation to Place 3 4 4 5 5   Registration 3 3 3 3 3   Attention/ Calculation 4 4 5 3 5   Recall 1 1 2 3 1   Language- name 2 objects 2 2 2 2 2   Language- repeat 1 1 1 1 1   Language- follow 3 step command 3 3 3 3 3   Language- read & follow direction 1 1 1 1 1   Write a sentence 1 1 1 1 1   Copy design 0 0 1 1 1   Total score 19 22 25 28 27         Immunization History  Administered Date(s) Administered  . Influenza Whole 12/27/2011, 11/05/2012  . Influenza-Unspecified 11/18/2013, 11/03/2014, 11/16/2015, 11/13/2016  . PPD Test 04/04/2008, 11/23/2014  . Pneumococcal Conjugate-13 09/30/2016  . Pneumococcal Polysaccharide-23 02/05/2008, 06/03/2008  . Td 02/05/2008, 06/03/2008    Qualifies for Shingles Vaccine? Not in past records  Screening Tests Health Maintenance  Topic Date Due  . INFLUENZA VACCINE  09/04/2017  . TETANUS/TDAP  06/04/2018  . DEXA SCAN  Completed  . PNA vac Low Risk Adult  Completed    Cancer Screenings: Lung: Low Dose CT Chest recommended if Age 43-80 years, 30 pack-year currently smoking OR have quit w/in 15years. Patient does not qualify. Breast:  Up to date on Mammogram?  Yes   Up to date of Bone Density/Dexa? Yes Colorectal: up to date  Additional Screenings:  Hepatitis C Screening:declined Flu vaccine due: will receive at [facility]:    I have personally reviewed and addressed the Medicare Annual Wellness questionnaire and have noted the following in the patient's chart:  A. Medical and social history B. Use of alcohol, tobacco or illicit drugs  C. Current medications and supplements D. Functional ability and status E.  Nutritional status F.  Physical activity G. Advance directives H. List of other physicians I.  Hospitalizations, surgeries, and ER visits in previous 12 months J.  [place] to include hearing, vision, cognitive, depression L. Referrals and appointments - none  In addition, I have reviewed and discussed with patient certain preventive protocols, quality metrics, and best practice recommendations. A written personalized care plan for preventive services as well as general preventive health recommendations were provided to patient.  See attached scanned questionnaire for additional information.   Signed,   Tyson Dense, RN Nurse Health Advisor  Patient Concerns: None

## 2017-10-16 DIAGNOSIS — S52531D Colles' fracture of right radius, subsequent encounter for closed fracture with routine healing: Secondary | ICD-10-CM | POA: Diagnosis not present

## 2017-10-16 DIAGNOSIS — Z4789 Encounter for other orthopedic aftercare: Secondary | ICD-10-CM | POA: Diagnosis not present

## 2017-10-20 ENCOUNTER — Non-Acute Institutional Stay (SKILLED_NURSING_FACILITY): Payer: Medicare Other | Admitting: Internal Medicine

## 2017-10-20 DIAGNOSIS — R32 Unspecified urinary incontinence: Secondary | ICD-10-CM | POA: Diagnosis not present

## 2017-10-20 DIAGNOSIS — L308 Other specified dermatitis: Secondary | ICD-10-CM | POA: Diagnosis not present

## 2017-10-20 NOTE — Progress Notes (Signed)
Location:  [place] of Service:  SNF (31) Provider:  Blanchie Serve, MD  Blanchie Serve, MD  Patient Care Team: Blanchie Serve, MD as PCP - General (Internal Medicine) Newton Pigg, MD as Consulting Physician (Obstetrics and Gynecology) Monna Fam, MD as Consulting Physician (Ophthalmology) Melida Quitter, MD as Consulting Physician (Otolaryngology) Myrlene Broker, MD as Consulting Physician (Urology) [place], [place], Nelda Bucks, NP as Nurse Practitioner (Family Medicine)  Extended Emergency Contact Information Primary Emergency Contact: Crigger,[name] Address: [place], [place] Phone: [phone] Work Phone: [phone] Mobile Phone: [phone] Relation: Son Secondary Emergency Contact: Teofilo [place] of [place] Phone: [phone] Work Phone: [phone] Relation: Friend  Code Status:  DNR  Goals of care: Advanced Directive information Advanced Directives 10/09/2017  Does Patient Have a Medical Advance Directive? Yes  Type of Advance Directive Living will;Out of facility DNR (pink MOST or yellow form)  Does patient want to make changes to medical advance directive? No - Patient declined  Copy of [place] in Chart? Yes  Pre-existing out of facility DNR order (yellow form or pink MOST form) Yellow form placed in chart (order not valid for inpatient use);Pink MOST form placed in chart (order not valid for inpatient use)     Chief Complaint  Patient presents with  . Acute Visit    skin concerns from nursing    HPI:  Pt is a 84 y.o. female seen today for an acute visit for skin concern. Physical therapy team notified nursing of progressive discoloration to the patient's buttock area today. They had informed nursing staff last week of red discoloration but today they noticed purple discoloration. Patient is seen in her room today with  therapy and nursing present.she denies any pain or itching to her bottom or groin area. She is incontinent with her urine. She is here for rehabilitation post IM nailing to right intertrochanteric fracture of right femur and ORIF of right distal radius fracture.    Past Medical History:  Diagnosis Date  . Asthma   . Carpal tunnel syndrome 03/07/2009  . Disturbance of skin sensation 02/21/2009  . Diverticulosis of colon (without mention of hemorrhage) 02/16/2009  . Fracture of upper end of left humerus 11/18/14  . 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 Hypothyroid   . Osteoarthrosis, unspecified whether generalized or localized, unspecified site 02/16/2009  . Other abnormal blood chemistry 06/19/2010   hyperglycemia  . Other and unspecified hyperlipidemia 10/24/2009  . Other atopic dermatitis and related conditions 02/16/2009  . Otosclerosis, unspecified 02/22/2003  . Pain in limb 12/05/2009  . Senile osteoporosis 06/16/2010  . Sensorineural hearing loss, unspecified 02/21/2009  . Syncope and collapse 05/30/2009  . Unspecified nasal polyp 02/21/2009  . Unspecified urinary incontinence 02/21/2009   Past Surgical History:  Procedure Laterality Date  . ABDOMINAL HYSTERECTOMY  1999   Dr. Collier Bullock  . BREAST BIOPSY  07/16/1990   benign left breast needle biopsy  Dr. Margot Chimes  . CARPAL TUNNEL RELEASE  04/07/2009   right Dr. Daylene Katayama  . CATARACT EXTRACTION W/ INTRAOCULAR LENS  IMPLANT, BILATERAL Bilateral   . INTRAMEDULLARY (IM) NAIL INTERTROCHANTERIC Right 09/17/2017   Procedure: INTRAMEDULLARY (IM) NAIL INTERTROCHANTRIC HIP;  Surgeon: Nicholes Stairs, MD;  Location: [facility];  Service: Orthopedics;  Laterality: Right;  . OPEN REDUCTION INTERNAL FIXATION (ORIF) DISTAL RADIAL FRACTURE Right 09/17/2017   Procedure:  OPEN REDUCTION INTERNAL FIXATION (ORIF) DISTAL RADIAL FRACTURE;  Surgeon: Iran Planas, MD;  Location: [facility];  Service: Orthopedics;  Laterality: Right;  . TRIGGER FINGER RELEASE Left 08/09/2014    Procedure: RELEASE TRIGGER FINGER/A-1 PULLEY LEFT MIDDLE FINGER;  Surgeon: Daryll Brod, MD;  Location: [facility];  Service: Orthopedics;  Laterality: Left;  REGIONAL/FAB    Allergies  Allergen Reactions  . Sulfa Antibiotics Itching and Rash  . Amoxicillin Other (See Comments)    Per [facility] 09/15/17 Has patient had a PCN reaction causing immediate rash, facial/tongue/throat swelling, SOB or lightheadedness with hypotension: Unknown Has patient had a PCN reaction causing severe rash involving mucus membranes or skin necrosis: Unknown Has patient had a PCN reaction that required hospitalization: Unknown Has patient had a PCN reaction occurring within the last 10 years: Unknown If all of the above answers are "NO", then may proceed with Cephalosporin use.  . Avelox [Moxifloxacin Hcl In Nacl] Itching  . Doxycycline Other (See Comments)    Unknown reaction  . Hista-Tabs [Triprolidine-Pse] Other (See Comments)    Passed out  . Pyrilamine-Phenylephrine Other (See Comments)    Unknown reaction - per [facility] 09/15/17  . Septra [Sulfamethoxazole-Trimethoprim] Other (See Comments)    Unknown reaction    Outpatient Encounter Medications as of 10/20/2017  Medication Sig  . acetaminophen (TYLENOL) 325 MG tablet Take 2 tablets (650 mg total) by mouth every 6 (six) hours as needed for mild pain or moderate pain.  Marland Kitchen albuterol (VENTOLIN HFA) 108 (90 Base) MCG/ACT inhaler Inhale 2 puffs into the lungs every 6 (six) hours as needed for shortness of breath (dyspnea).   Marland Kitchen aspirin EC 81 MG EC tablet Take 1 tablet (81 mg total) by mouth 2 (two) times daily.  Marland Kitchen escitalopram (LEXAPRO) 10 MG tablet Take 10 mg by mouth daily.  . feeding supplement (BOOST HIGH PROTEIN) LIQD Take 100-237 mLs by mouth daily.   . ferrous sulfate 325 (65 FE) MG tablet Take 1 tablet (325 mg total) by mouth daily with breakfast.  . fluticasone (FLONASE) 50 MCG/ACT nasal spray Place 2 sprays into both nostrils daily.  .  Fluticasone-Salmeterol (ADVAIR) 250-50 MCG/DOSE AEPB Inhale 1 puff 2 (two) times daily into the lungs. Rinse mouth after use  . furosemide (LASIX) 40 MG tablet Take 40 mg by mouth daily.   Marland Kitchen ipratropium (ATROVENT) 0.02 % nebulizer solution Take 0.5 mg by nebulization every 6 (six) hours as needed for wheezing or shortness of breath.  . levothyroxine (SYNTHROID, LEVOTHROID) 25 MCG tablet Take 25 mcg by mouth daily before breakfast.  . mirtazapine (REMERON) 15 MG tablet Take 15 mg by mouth every evening.   . montelukast (SINGULAIR) 10 MG tablet Take one tablet by mouth once daily to help breathing  . MYRBETRIQ 25 MG TB24 tablet TAKE ONE TABLET BY MOUTH ONCE DAILY  . OXYGEN Inhale 2 L/min into the lungs as needed (to keep O2 SATS >90%).   . polyethylene glycol (MIRALAX / GLYCOLAX) packet Take 17 g by mouth daily.  . raloxifene (EVISTA) 60 MG tablet Take 1 tablet by mouth  daily to treat osteoporosis  . ranitidine (ZANTAC) 150 MG tablet Take 150 mg by mouth daily.  Marland Kitchen zinc oxide 20 % ointment Apply 1 application topically See admin instructions. Start date 09/08/17: apply topically to buttocks every shift for 10 days  and as needed after incontinent episodes.   No facility-administered encounter medications on file as of 10/20/2017.     Review of Systems  Constitutional: Positive for fatigue. Negative for fever.  Respiratory: Negative for shortness of breath.   Gastrointestinal: Negative for abdominal pain.  Musculoskeletal: Positive for gait problem.  Neurological: Negative for headaches.  Psychiatric/Behavioral: Positive for confusion.    Immunization History  Administered Date(s) Administered  . Influenza Whole 12/27/2011, 11/05/2012  . Influenza-Unspecified 11/18/2013, 11/03/2014, 11/16/2015, 11/13/2016  . PPD Test 04/04/2008, 11/23/2014  . Pneumococcal Conjugate-13 09/30/2016  . Pneumococcal Polysaccharide-23 02/05/2008, 06/03/2008  . Td 02/05/2008, 06/03/2008   Pertinent  Health  Maintenance Due  Topic Date Due  . INFLUENZA VACCINE  09/04/2017  . DEXA SCAN  Completed  . PNA vac Low Risk Adult  Completed   Fall Risk  10/09/2017 09/23/2016 08/27/2016 05/30/2015 05/16/2015  Falls in the past year? Yes Yes Yes Yes No  Comment - - [facility] Telephone Survey: data to providers prior to load - -  Number falls in past yr: 2 or more 1 1 1  -  Comment - - [facility] Telephone Survey Actual Response = 1 - -  Injury with Fall? Yes No No Yes -  Comment broke R wrist - - - -  Risk Factor Category  - - - - -  Risk for fall due to : - - - History of fall(s);Impaired balance/gait;Impaired mobility -  Follow up - - - Falls evaluation completed;Education provided -   Functional Status Survey:    Vitals:   10/20/17 1228  BP: (!) 124/57  Pulse: (!) 58  Resp: 18  Temp: 98.1 F (36.7 C)  SpO2: 95%   There is no height or weight on file to calculate BMI. Physical Exam  Constitutional:  Elderly female in no distress  HENT:  Head: Normocephalic and atraumatic.  Mouth/Throat: Oropharynx is clear and moist.  Neck: Neck supple.  Cardiovascular: Normal rate and regular rhythm.  Pulmonary/Chest: Effort normal and breath sounds normal.  Abdominal: Soft. Bowel sounds are normal.  Musculoskeletal: She exhibits edema.  Neurological: She is alert.  Oriented to person and place  Skin: Skin is warm and dry. She is not diaphoretic.  Purplish discoloration to bilateral buttock area that is blanchable. No skin breakdown. Non tender. Some moisture associated redness to groin area  Psychiatric: She has a normal mood and affect.    Labs reviewed: Recent Labs    09/19/17 0655 09/20/17 0330 09/21/17 0235 09/22/17 09/29/17  NA 142 143 143 142 143  K 3.9 4.3 4.4 4.9 4.5  CL 111 112* 112*  --   --   CO2 25 25 26   --   --   GLUCOSE 108* 129* 112*  --   --   BUN 23 23 22 21 19   CREATININE 0.98 0.81 0.80 0.76 0.96  CALCIUM 8.1* 8.1* 8.4* 8.6 9.0  MG 2.3 2.3 2.2  --   --    Recent Labs    06/02/17  09/29/17  AST 12*  12 15  ALT 8  8 12   ALKPHOS 65  65 82  BILITOT 0.4 0.9  PROT  --  6.2  ALBUMIN 3.9 3.4   Recent Labs    12/05/16  09/15/17 1907 09/16/17 0407  09/19/17 0655  09/20/17 0330 09/21/17 0235 09/22/17 09/29/17  WBC 7.3   < > 16.6* 11.2*   < > 9.9  --  9.1 10.9* 10.4 9.1  NEUTROABS 4,132  --  14.0* 8.7*  --   --   --   --   --   --   --   HGB 12.7   < >  12.7 11.4*   < > 6.9*   < > 8.4* 8.9* 9.5 10.4  HCT 39   < > 40.3 37.2   < > 22.9*   < > 26.7* 28.5* 28.3 31  MCV  --    < > 93.3 94.7   < > 98.3  --  96.4 96.3 91.3 93.1  PLT 272   < > 252 251   < > 192  --  210 263  --   --    < > = values in this interval not displayed.   Lab Results  Component Value Date   TSH 1.67 09/29/2017   Lab Results  Component Value Date   HGBA1C 6.0 (H) 09/16/2017   Lab Results  Component Value Date   CHOL 211 (A) 02/14/2014   HDL 47 02/14/2014   LDLCALC 133 02/14/2014   TRIG 155 02/14/2014    Significant Diagnostic Results in last 30 days:  No results found.  Assessment/Plan  1. Dermatitis associated with moisture Denies pain or itching. Blanchable discoloration. Generalized area involvement. Sensation intact. Incontinence care every 4 hours for now while awake besides facility protocol. Keep perineal area clean and dry. Would also offload pressure to bottom by repositioning sideways every 3 hours while awake. Air mattress in place. Zinc oxide to buttock area. To reassess if worsens.   2. Urinary incontinence, unspecified type Perineal care and incontinence care every 4 hours if possible and in between as needed   Family/ staff Communication: reviewed care plan with patient and charge nurse.    Labs/tests ordered:  none  Blanchie Serve, MD Internal Medicine [facility] [address] [place], [place] [address] Cell Phone (Monday-Friday 8 am - 5 pm): [phone] On Call: [phone] and follow prompts after 5 pm and on  weekends Office Phone: [phone] Office Fax: [phone]

## 2017-10-28 ENCOUNTER — Non-Acute Institutional Stay (SKILLED_NURSING_FACILITY): Payer: Medicare Other | Admitting: Family

## 2017-10-28 ENCOUNTER — Encounter: Payer: Self-pay | Admitting: Family

## 2017-10-28 DIAGNOSIS — K219 Gastro-esophageal reflux disease without esophagitis: Secondary | ICD-10-CM | POA: Diagnosis not present

## 2017-10-28 DIAGNOSIS — F329 Major depressive disorder, single episode, unspecified: Secondary | ICD-10-CM

## 2017-10-28 DIAGNOSIS — S52501S Unspecified fracture of the lower end of right radius, sequela: Secondary | ICD-10-CM | POA: Diagnosis not present

## 2017-10-28 DIAGNOSIS — N3281 Overactive bladder: Secondary | ICD-10-CM | POA: Diagnosis not present

## 2017-10-28 DIAGNOSIS — E039 Hypothyroidism, unspecified: Secondary | ICD-10-CM

## 2017-10-28 DIAGNOSIS — I739 Peripheral vascular disease, unspecified: Secondary | ICD-10-CM

## 2017-10-28 DIAGNOSIS — J42 Unspecified chronic bronchitis: Secondary | ICD-10-CM | POA: Diagnosis not present

## 2017-10-28 NOTE — Progress Notes (Signed)
Location:  [place] Room Number: 25 Place of Service:  SNF (31) Provider: Megumi Treaster FNP-C   Blanchie Serve, MD  Patient Care Team: Blanchie Serve, MD as PCP - General (Internal Medicine) Newton Pigg, MD as Consulting Physician (Obstetrics and Gynecology) Monna Fam, MD as Consulting Physician (Ophthalmology) Melida Quitter, MD as Consulting Physician (Otolaryngology) Myrlene Broker, MD as Consulting Physician (Urology) [place], [place], Nelda Bucks, NP as Nurse Practitioner (Family Medicine)  Extended Emergency Contact Information Primary Emergency Contact: Kantz,[name] Address: [place], [place] Phone: [phone] Work Phone: [phone] Mobile Phone: [phone] Relation: Son Secondary Emergency Contact: Teofilo [place] of [place] Phone: [phone] Work Phone: [phone] Relation: Friend  Code Status:DNR Goals of care: Advanced Directive information Advanced Directives 10/09/2017  Does Patient Have a Medical Advance Directive? Yes  Type of Advance Directive Living will;Out of facility DNR (pink MOST or yellow form)  Does patient want to make changes to medical advance directive? No - Patient declined  Copy of [place] in Chart? Yes  Pre-existing out of facility DNR order (yellow form or pink MOST form) Yellow form placed in chart (order not valid for inpatient use);Pink MOST form placed in chart (order not valid for inpatient use)     Chief Complaint  Patient presents with  . Medical Management of Chronic Issues    monthly visit     HPI:  Pt is a 84 y.o. female seen today [facility] for medical management of chronic diseases.she has a medical history of COPD,PVD,Hypothyroidism,OAB,GERD, major depression among other conditions.she is seen in her room today with facility charge Nurse present at bedside.she denies any  acute issues during visit.she states right hand pain under control with Tylenol.she continues to work with therapy.she has right hand brace that she wears with activity.She has had a weight loss 11.6 lbs over the past one month.she eats 25-100% of her meals.currently on Boost supplement Daily.Regiestered Dietician following up with her weight.No recent fall episode.Nurse states patient continues to have purple skin discoloration to bilateral gluteal and sacrum area treated with zinc oxide.No open ulcers.skin discoloration has improved per staff.    Past Medical History:  Diagnosis Date  . Asthma   . Carpal tunnel syndrome 03/07/2009  . Disturbance of skin sensation 02/21/2009  . Diverticulosis of colon (without mention of hemorrhage) 02/16/2009  . Fracture of upper end of left humerus 11/18/14  . 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 Hypothyroid   . Osteoarthrosis, unspecified whether generalized or localized, unspecified site 02/16/2009  . Other abnormal blood chemistry 06/19/2010   hyperglycemia  . Other and unspecified hyperlipidemia 10/24/2009  . Other atopic dermatitis and related conditions 02/16/2009  . Otosclerosis, unspecified 02/22/2003  . Pain in limb 12/05/2009  . Senile osteoporosis 06/16/2010  . Sensorineural hearing loss, unspecified 02/21/2009  . Syncope and collapse 05/30/2009  . Unspecified nasal polyp 02/21/2009  . Unspecified urinary incontinence 02/21/2009   Past Surgical History:  Procedure Laterality Date  . ABDOMINAL HYSTERECTOMY  1999   Dr. Collier Bullock  . BREAST BIOPSY  07/16/1990   benign left breast needle biopsy  Dr. Margot Chimes  . CARPAL TUNNEL RELEASE  04/07/2009   right Dr. Daylene Katayama  . CATARACT EXTRACTION W/ INTRAOCULAR LENS  IMPLANT, BILATERAL Bilateral   . INTRAMEDULLARY (IM) NAIL INTERTROCHANTERIC Right 09/17/2017   Procedure: INTRAMEDULLARY (IM) NAIL INTERTROCHANTRIC HIP;  Surgeon: Nicholes Stairs, MD;  Location: [facility];  Service: Orthopedics;  Laterality: Right;  . OPEN  REDUCTION INTERNAL FIXATION (ORIF) DISTAL RADIAL FRACTURE Right 09/17/2017   Procedure: OPEN REDUCTION INTERNAL FIXATION (ORIF) DISTAL RADIAL FRACTURE;  Surgeon: Iran Planas, MD;  Location: [facility];  Service: Orthopedics;  Laterality: Right;  . TRIGGER FINGER RELEASE Left 08/09/2014   Procedure: RELEASE TRIGGER FINGER/A-1 PULLEY LEFT MIDDLE FINGER;  Surgeon: Daryll Brod, MD;  Location: [facility];  Service: Orthopedics;  Laterality: Left;  REGIONAL/FAB    Allergies  Allergen Reactions  . Sulfa Antibiotics Itching and Rash  . Amoxicillin Other (See Comments)    Per [facility] 09/15/17 Has patient had a PCN reaction causing immediate rash, facial/tongue/throat swelling, SOB or lightheadedness with hypotension: Unknown Has patient had a PCN reaction causing severe rash involving mucus membranes or skin necrosis: Unknown Has patient had a PCN reaction that required hospitalization: Unknown Has patient had a PCN reaction occurring within the last 10 years: Unknown If all of the above answers are "NO", then may proceed with Cephalosporin use.  . Avelox [Moxifloxacin Hcl In Nacl] Itching  . Doxycycline Other (See Comments)    Unknown reaction  . Hista-Tabs [Triprolidine-Pse] Other (See Comments)    Passed out  . Pyrilamine-Phenylephrine Other (See Comments)    Unknown reaction - per [facility] 09/15/17  . Septra [Sulfamethoxazole-Trimethoprim] Other (See Comments)    Unknown reaction    Allergies as of 10/28/2017      Reactions   Sulfa Antibiotics Itching, Rash   Amoxicillin Other (See Comments)   Per [facility] 09/15/17 Has patient had a PCN reaction causing immediate rash, facial/tongue/throat swelling, SOB or lightheadedness with hypotension: Unknown Has patient had a PCN reaction causing severe rash involving mucus membranes or skin necrosis: Unknown Has patient had a PCN reaction that required hospitalization: Unknown Has patient had a PCN reaction occurring within the last 10 years: Unknown If  all of the above answers are "NO", then may proceed with Cephalosporin use.   Avelox [moxifloxacin Hcl In Nacl] Itching   Doxycycline Other (See Comments)   Unknown reaction   Hista-tabs [triprolidine-pse] Other (See Comments)   Passed out   Pyrilamine-phenylephrine Other (See Comments)   Unknown reaction - per [facility] 09/15/17   Septra [sulfamethoxazole-trimethoprim] Other (See Comments)   Unknown reaction      Medication List        Accurate as of 10/28/17 12:20 PM. Always use your most recent med list.          acetaminophen 500 MG tablet Commonly known as:  TYLENOL Take 1,000 mg by mouth every 8 (eight) hours.   escitalopram 10 MG tablet Commonly known as:  LEXAPRO Take 10 mg by mouth daily.   feeding supplement Liqd Take 100-237 mLs by mouth daily.   ferrous sulfate 325 (65 FE) MG tablet Take 1 tablet (325 mg total) by mouth daily with breakfast.   fluticasone 50 MCG/ACT nasal spray Commonly known as:  FLONASE Place 2 sprays into both nostrils daily.   Fluticasone-Salmeterol 250-50 MCG/DOSE Aepb Commonly known as:  ADVAIR Inhale 1 puff 2 (two) times daily into the lungs. Rinse mouth after use   furosemide 40 MG tablet Commonly known as:  LASIX Take 40 mg by mouth daily.   ipratropium 0.02 % nebulizer solution Commonly known as:  ATROVENT Take 0.5 mg by nebulization every 6 (six) hours as needed for wheezing or shortness of breath.   levothyroxine 25 MCG tablet Commonly known as:  SYNTHROID, LEVOTHROID Take 25 mcg by mouth daily before breakfast.   mirtazapine 15 MG tablet Commonly known as:  REMERON Take 15 mg by mouth every evening.   montelukast 10 MG tablet Commonly known as:  SINGULAIR Take one tablet by mouth once daily to help breathing   MYRBETRIQ 25 MG Tb24 tablet Generic drug:  mirabegron ER TAKE ONE TABLET BY MOUTH ONCE DAILY   OXYGEN Inhale 2 L/min into the lungs as needed (to keep O2 SATS >90%).   polyethylene glycol packet Commonly  known as:  MIRALAX / GLYCOLAX Take 17 g by mouth daily.   raloxifene 60 MG tablet Commonly known as:  EVISTA Take 1 tablet by mouth  daily to treat osteoporosis   ranitidine 150 MG tablet Commonly known as:  ZANTAC Take 150 mg by mouth daily.   VENTOLIN HFA 108 (90 Base) MCG/ACT inhaler Generic drug:  albuterol Inhale 2 puffs into the lungs every 6 (six) hours as needed for shortness of breath (dyspnea).   zinc oxide 20 % ointment Apply 1 application topically See admin instructions. Start date 09/08/17: apply topically to buttocks every shift for 10 days  and as needed after incontinent episodes.       Review of Systems  Reason unable to perform ROS: additional information provided by facility Nurse   Constitutional: Negative for appetite change, chills, fatigue and fever.  HENT: Positive for hearing loss. Negative for congestion, postnasal drip, rhinorrhea, sinus pressure, sinus pain, sneezing, sore throat and trouble swallowing.   Eyes: Positive for visual disturbance. Negative for discharge, redness and itching.  Respiratory: Negative for cough, chest tightness, shortness of breath and wheezing.   Cardiovascular: Negative for chest pain, palpitations and leg swelling.  Gastrointestinal: Negative for abdominal distention, abdominal pain, constipation, diarrhea, nausea and vomiting.  Endocrine: Negative for cold intolerance, heat intolerance, polydipsia, polyphagia and polyuria.  Genitourinary: Negative for dysuria, flank pain and urgency.  Musculoskeletal: Positive for arthralgias and gait problem.  Skin: Negative for pallor and rash.       Purple Skin discoloration to both gluteal and sacrum area has improved with zinc treatment.   Neurological: Negative for dizziness, light-headedness and headaches.  Hematological: Does not bruise/bleed easily.  Psychiatric/Behavioral: Negative for agitation and sleep disturbance. The patient is not nervous/anxious.     Immunization History    Administered Date(s) Administered  . Influenza Whole 12/27/2011, 11/05/2012  . Influenza-Unspecified 11/18/2013, 11/03/2014, 11/16/2015, 11/13/2016  . PPD Test 04/04/2008, 11/23/2014  . Pneumococcal Conjugate-13 09/30/2016  . Pneumococcal Polysaccharide-23 02/05/2008, 06/03/2008  . Td 02/05/2008, 06/03/2008   Pertinent  Health Maintenance Due  Topic Date Due  . INFLUENZA VACCINE  09/04/2017  . DEXA SCAN  Completed  . PNA vac Low Risk Adult  Completed   Fall Risk  10/09/2017 09/23/2016 08/27/2016 05/30/2015 05/16/2015  Falls in the past year? Yes Yes Yes Yes No  Comment - - [facility] Telephone Survey: data to providers prior to load - -  Number falls in past yr: 2 or more 1 1 1  -  Comment - - [facility]i Telephone Survey Actual Response = 1 - -  Injury with Fall? Yes No No Yes -  Comment broke R wrist - - - -  Risk Factor Category  - - - - -  Risk for fall due to : - - - History of fall(s);Impaired balance/gait;Impaired mobility -  Follow up - - - Falls evaluation completed;Education provided -   Functional Status Survey: Is the patient deaf or have difficulty hearing?:  Yes Does the patient have difficulty seeing, even when wearing glasses/contacts?: Yes Does the patient have difficulty concentrating, remembering, or making decisions?: Yes Does the patient have difficulty walking or climbing stairs?: Yes Does the patient have difficulty dressing or bathing?: Yes Does the patient have difficulty doing errands alone such as visiting a doctor's office or shopping?: Yes  Vitals:   10/28/17 1135  BP: (!) 121/57  Pulse: (!) 59  Resp: 18  Temp: (!) 97.1 F (36.2 C)  SpO2: 96%  Weight: 132 lb 12.8 oz (60.2 kg)  Height: 4\' 8"  (1.422 m)   Body mass index is 29.77 kg/m. Physical Exam  Constitutional: She appears well-developed and well-nourished.  Elderly in no acute distress   HENT:  Head: Normocephalic.  Right Ear: External ear normal.  Left Ear: External ear normal.  Mouth/Throat:  Oropharynx is clear and moist. No oropharyngeal exudate.  Eyes: Pupils are equal, round, and reactive to light. Conjunctivae are normal. Right eye exhibits no discharge. Left eye exhibits no discharge. No scleral icterus.  Corrective eye glasses in place   Neck: Normal range of motion. No JVD present. No thyromegaly present.  Cardiovascular: Normal rate, regular rhythm, normal heart sounds and intact distal pulses. Exam reveals no gallop and no friction rub.  No murmur heard. Pulmonary/Chest: Effort normal and breath sounds normal. No respiratory distress. She has no wheezes. She has no rales.  Abdominal: Soft. Bowel sounds are normal. She exhibits no distension and no mass. There is no tenderness. There is no rebound and no guarding.  Genitourinary:  Genitourinary Comments: Incontinent  Musculoskeletal: She exhibits no tenderness.  Moves x 4 extremities.unsteady gait transfers with assistance.  Lymphadenopathy:    She has no cervical adenopathy.  Neurological: Coordination abnormal.  Skin: Skin is warm and dry. No rash noted. No pallor.  1.Bilateral gluteal and sacral areas deep purple blanchable skin color.Non-tender to touch.No open ulcers noted.  2. Right hand previous incision site healed site intact,no tenderness or signs of infections noted.Hand brace off during visit.   Psychiatric: She has a normal mood and affect. Her speech is normal and behavior is normal. Judgment and thought content normal.  Nursing note and vitals reviewed.  Labs reviewed: Recent Labs    09/19/17 0655 09/20/17 0330 09/21/17 0235 09/22/17 09/29/17  NA 142 143 143 142 143  K 3.9 4.3 4.4 4.9 4.5  CL 111 112* 112*  --   --   CO2 25 25 26   --   --   GLUCOSE 108* 129* 112*  --   --   BUN 23 23 22 21 19   CREATININE 0.98 0.81 0.80 0.76 0.96  CALCIUM 8.1* 8.1* 8.4* 8.6 9.0  MG 2.3 2.3 2.2  --   --    Recent Labs    06/02/17 09/29/17  AST 12*  12 15  ALT 8  8 12   ALKPHOS 65  65 82  BILITOT 0.4 0.9    PROT  --  6.2  ALBUMIN 3.9 3.4   Recent Labs    12/05/16  09/15/17 1907 09/16/17 0407  09/19/17 0655  09/20/17 0330 09/21/17 0235 09/22/17 09/29/17  WBC 7.3   < > 16.6* 11.2*   < > 9.9  --  9.1 10.9* 10.4 9.1  NEUTROABS 4,132  --  14.0* 8.7*  --   --   --   --   --   --   --   HGB 12.7   < > 12.7 11.4*   < >  6.9*   < > 8.4* 8.9* 9.5 10.4  HCT 39   < > 40.3 37.2   < > 22.9*   < > 26.7* 28.5* 28.3 31  MCV  --    < > 93.3 94.7   < > 98.3  --  96.4 96.3 91.3 93.1  PLT 272   < > 252 251   < > 192  --  210 263  --   --    < > = values in this interval not displayed.   Lab Results  Component Value Date   TSH 1.67 09/29/2017   Lab Results  Component Value Date   HGBA1C 6.0 (H) 09/16/2017   Lab Results  Component Value Date   CHOL 211 (A) 02/14/2014   HDL 47 02/14/2014   LDLCALC 133 02/14/2014   TRIG 155 02/14/2014    Significant Diagnostic Results in last 30 days:  No results found.  Assessment/Plan 1. Chronic bronchitis, unspecified chronic bronchitis type ([place]) Breathing stable.continue on Atrovent nebulizer every 6 hours as needed,Advair 250-50 mcg inhale 2 puffs twice daily and Albuterol 108 mcg/ihnaler 2 puffs every 6 hours as needed.On Singulair 10 mg tablet daily and oxygen 2 Liters via nasal cannula as needed.continue to monitor.     2. Acquired hypothyroidism Lab Results  Component Value Date   TSH 1.67 09/29/2017  Continue on levothyroxine 25 mcg tablet daily before breakfast.monitor TSH level.   3. Major depression, chronic Mood stable.continue on Escitalopram 10 mg tablet daily.monitor for mood changes.   4. PVD (peripheral vascular disease) (HCC) Bilateral lower extremities edema has improved.No ulceration noted.continue on Furosemide 40 mg tablet daily.Monitor BMP    5. OAB (overactive bladder) Continue on MyrbetriQ 25 mg tablet daily.   6. Gastroesophageal reflux disease without esophagitis Stable.continue on Ranitidine 150 mg tablet daily.   7.  Closed fracture of distal end of right radius, unspecified fracture morphology, sequela Right hand incision site healed without any signs or symptoms of infections.continue with Therapy and hand brace.   Family/ staff Communication: Reviewed plan of care with patient and facility Nurse   Labs/tests ordered: None   Nelda Bucks Jeannifer Drakeford, NP

## 2017-11-05 DIAGNOSIS — S72141D Displaced intertrochanteric fracture of right femur, subsequent encounter for closed fracture with routine healing: Secondary | ICD-10-CM | POA: Diagnosis not present

## 2017-11-05 DIAGNOSIS — Z4789 Encounter for other orthopedic aftercare: Secondary | ICD-10-CM | POA: Diagnosis not present

## 2017-11-13 DIAGNOSIS — Z5189 Encounter for other specified aftercare: Secondary | ICD-10-CM | POA: Diagnosis not present

## 2017-11-13 DIAGNOSIS — S52521D Torus fracture of lower end of right radius, subsequent encounter for fracture with routine healing: Secondary | ICD-10-CM | POA: Diagnosis not present

## 2017-11-27 ENCOUNTER — Non-Acute Institutional Stay (SKILLED_NURSING_FACILITY): Payer: Medicare Other | Admitting: Family

## 2017-11-27 ENCOUNTER — Encounter: Payer: Self-pay | Admitting: Family

## 2017-11-27 DIAGNOSIS — N3281 Overactive bladder: Secondary | ICD-10-CM

## 2017-11-27 DIAGNOSIS — J42 Unspecified chronic bronchitis: Secondary | ICD-10-CM

## 2017-11-27 DIAGNOSIS — F329 Major depressive disorder, single episode, unspecified: Secondary | ICD-10-CM

## 2017-11-27 DIAGNOSIS — E039 Hypothyroidism, unspecified: Secondary | ICD-10-CM

## 2017-11-27 DIAGNOSIS — S52501S Unspecified fracture of the lower end of right radius, sequela: Secondary | ICD-10-CM | POA: Diagnosis not present

## 2017-11-27 DIAGNOSIS — R630 Anorexia: Secondary | ICD-10-CM

## 2017-11-27 NOTE — Progress Notes (Signed)
Location:  [facility] Room Number: 25 Place of Service:  SNF (31) Provider: Urania Pearlman FNP-C   Jeremaih Klima, Nelda Bucks, NP  Patient Care Team: Falicia Lizotte, Nelda Bucks, NP as PCP - General (Family Medicine) Newton Pigg, MD as Consulting Physician (Obstetrics and Gynecology) Monna Fam, MD as Consulting Physician (Ophthalmology) Melida Quitter, MD as Consulting Physician (Otolaryngology) Myrlene Broker, MD as Consulting Physician (Urology) Melina Modena, [facility]  Extended Emergency Contact Information Primary Emergency Contact: Magill,[name] Address: [place], [place] Phone: [phone] Work Phone: [phone] Mobile Phone: [phone] Relation: Son Secondary Emergency Contact: Teofilo [place] of [place] Phone: [phone] Work Phone: [phone] Relation: Friend  Code Status:  DNR Goals of care: Advanced Directive information Advanced Directives 11/27/2017  Does Patient Have a Medical Advance Directive? Yes  Type of Advance Directive Living will;Out of facility DNR (pink MOST or yellow form)  Does patient want to make changes to medical advance directive? No - Patient declined  Copy of [place] in Chart? -  Pre-existing out of facility DNR order (yellow form or pink MOST form) Pink MOST form placed in chart (order not valid for inpatient use);Yellow form placed in chart (order not valid for inpatient use)     Chief Complaint  Patient presents with  . Medical Management of Chronic Issues    Routine Visit    HPI:  Pt is a 84 y.o. female seen today [place] for medical management of chronic diseases.she has a medical history of COPD,Hypothyroidism,PVD,GERD,major depression,OAB among other conditions.she is seen in her room today with facility Nurse present at bedside.she continues to work with therapy post fall with displaced intertrochanteric  fracture of right femur and closed fracture of right distal radius post IM nailing of right intertrochanteric hip and ORIF of right distal radial fracture.she denies any pain during visit.she was seen by Orthopedic Dr.Ortmann Josph Macho 11/13/2017 right hand brace discontinued and patient to follow up in 4 weeks to repeat radiography and reassess motion.  Her weight log reviewed 2 lbs weight gain over one month.she had previus weight loss.she states continue to have poor appetite but likes to eat ice cream.Previous Purple skin discoloration on her gluteal area has improved.facility Nursing staff continue to apply zinc oxide.No ulcer reported.she sits on gel cushion on her wheelchair.  No recent fall episodes since hospital discharge.       Past Medical History:  Diagnosis Date  . Asthma   . Carpal tunnel syndrome 03/07/2009  . Disturbance of skin sensation 02/21/2009  . Diverticulosis of colon (without mention of hemorrhage) 02/16/2009  . Fracture of upper end of left humerus 11/18/14  . 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 Hypothyroid   . Osteoarthrosis, unspecified whether generalized or localized, unspecified site 02/16/2009  . Other abnormal blood chemistry 06/19/2010   hyperglycemia  . Other and unspecified hyperlipidemia 10/24/2009  . Other atopic dermatitis and related conditions 02/16/2009  . Otosclerosis, unspecified 02/22/2003  . Pain in limb 12/05/2009  . Senile osteoporosis 06/16/2010  . Sensorineural hearing loss, unspecified 02/21/2009  . Syncope and collapse 05/30/2009  . Unspecified nasal polyp 02/21/2009  . Unspecified urinary incontinence 02/21/2009   Past Surgical History:  Procedure Laterality Date  . ABDOMINAL HYSTERECTOMY  1999   Dr. Collier Bullock  . BREAST BIOPSY  07/16/1990   benign left breast needle biopsy  Dr. Margot Chimes  . CARPAL  TUNNEL RELEASE  04/07/2009   right Dr. Daylene Katayama  . CATARACT EXTRACTION W/ INTRAOCULAR LENS  IMPLANT, BILATERAL Bilateral   . INTRAMEDULLARY (IM) NAIL  INTERTROCHANTERIC Right 09/17/2017   Procedure: INTRAMEDULLARY (IM) NAIL INTERTROCHANTRIC HIP;  Surgeon: Nicholes Stairs, MD;  Location: [facility];  Service: Orthopedics;  Laterality: Right;  . OPEN REDUCTION INTERNAL FIXATION (ORIF) DISTAL RADIAL FRACTURE Right 09/17/2017   Procedure: OPEN REDUCTION INTERNAL FIXATION (ORIF) DISTAL RADIAL FRACTURE;  Surgeon: Iran Planas, MD;  Location: [facility];  Service: Orthopedics;  Laterality: Right;  . TRIGGER FINGER RELEASE Left 08/09/2014   Procedure: RELEASE TRIGGER FINGER/A-1 PULLEY LEFT MIDDLE FINGER;  Surgeon: Daryll Brod, MD;  Location: [facility];  Service: Orthopedics;  Laterality: Left;  REGIONAL/FAB    Allergies  Allergen Reactions  . Sulfa Antibiotics Itching and Rash  . Amoxicillin Other (See Comments)    Per [facility] 09/15/17 Has patient had a PCN reaction causing immediate rash, facial/tongue/throat swelling, SOB or lightheadedness with hypotension: Unknown Has patient had a PCN reaction causing severe rash involving mucus membranes or skin necrosis: Unknown Has patient had a PCN reaction that required hospitalization: Unknown Has patient had a PCN reaction occurring within the last 10 years: Unknown If all of the above answers are "NO", then may proceed with Cephalosporin use.  . Avelox [Moxifloxacin Hcl In Nacl] Itching  . Doxycycline Other (See Comments)    Unknown reaction  . Hista-Tabs [Triprolidine-Pse] Other (See Comments)    Passed out  . Pyrilamine-Phenylephrine Other (See Comments)    Unknown reaction - per [facility] 09/15/17  . Septra [Sulfamethoxazole-Trimethoprim] Other (See Comments)    Unknown reaction    Allergies as of 11/27/2017      Reactions   Sulfa Antibiotics Itching, Rash   Amoxicillin Other (See Comments)   Per [facility] 09/15/17 Has patient had a PCN reaction causing immediate rash, facial/tongue/throat swelling, SOB or lightheadedness with hypotension: Unknown Has patient had a PCN reaction causing severe rash  involving mucus membranes or skin necrosis: Unknown Has patient had a PCN reaction that required hospitalization: Unknown Has patient had a PCN reaction occurring within the last 10 years: Unknown If all of the above answers are "NO", then may proceed with Cephalosporin use.   Avelox [moxifloxacin Hcl In Nacl] Itching   Doxycycline Other (See Comments)   Unknown reaction   Hista-tabs [triprolidine-pse] Other (See Comments)   Passed out   Pyrilamine-phenylephrine Other (See Comments)   Unknown reaction - per [facility] 09/15/17   Septra [sulfamethoxazole-trimethoprim] Other (See Comments)   Unknown reaction      Medication List        Accurate as of 11/27/17  1:46 PM. Always use your most recent med list.          acetaminophen 500 MG tablet Commonly known as:  TYLENOL Take 1,000 mg by mouth every 8 (eight) hours.   aspirin EC 81 MG tablet Take 81 mg by mouth 2 (two) times daily.   escitalopram 10 MG tablet Commonly known as:  LEXAPRO Take 10 mg by mouth daily.   feeding supplement Liqd Take 100-237 mLs by mouth daily.   ferrous sulfate 325 (65 FE) MG EC tablet Take 325 mg by mouth daily with breakfast.   fluticasone 50 MCG/ACT nasal spray Commonly known as:  FLONASE Place 2 sprays into both nostrils daily.   Fluticasone-Salmeterol 250-50 MCG/DOSE Aepb Commonly known as:  ADVAIR Inhale 1 puff 2 (two) times daily into the lungs. Rinse mouth after use  furosemide 40 MG tablet Commonly known as:  LASIX Take 40 mg by mouth daily.   ipratropium 0.02 % nebulizer solution Commonly known as:  ATROVENT Take 0.5 mg by nebulization every 6 (six) hours as needed for wheezing or shortness of breath.   levothyroxine 25 MCG tablet Commonly known as:  SYNTHROID, LEVOTHROID Take 25 mcg by mouth daily before breakfast.   mirtazapine 15 MG tablet Commonly known as:  REMERON Take 15 mg by mouth every evening.   montelukast 10 MG tablet Commonly known as:  SINGULAIR Take one  tablet by mouth once daily to help breathing   MYRBETRIQ 25 MG Tb24 tablet Generic drug:  mirabegron ER TAKE ONE TABLET BY MOUTH ONCE DAILY   OXYGEN Inhale 2 L/min into the lungs as needed (to keep O2 SATS >90%).   polyethylene glycol packet Commonly known as:  MIRALAX / GLYCOLAX Take 17 g by mouth daily.   raloxifene 60 MG tablet Commonly known as:  EVISTA Take 1 tablet by mouth  daily to treat osteoporosis   ranitidine 150 MG tablet Commonly known as:  ZANTAC Take 150 mg by mouth daily.   VENTOLIN HFA 108 (90 Base) MCG/ACT inhaler Generic drug:  albuterol Inhale 2 puffs into the lungs every 6 (six) hours as needed for shortness of breath (dyspnea).   zinc oxide 20 % ointment Apply 1 application topically See admin instructions. Every Shift - PRN  Apply to buttocks as needed every shift after incontinent episodes       Review of Systems  Constitutional: Negative for chills, fatigue and fever.       Poor appetite per HPI   HENT: Positive for hearing loss. Negative for congestion, rhinorrhea, sinus pressure, sinus pain, sneezing, sore throat and trouble swallowing.   Eyes: Positive for visual disturbance. Negative for discharge and redness.       Wears eye glasses   Respiratory: Negative for cough, chest tightness, shortness of breath and wheezing.   Cardiovascular: Negative for chest pain, palpitations and leg swelling.  Gastrointestinal: Negative for abdominal distention, abdominal pain, constipation, diarrhea, nausea and vomiting.  Endocrine: Negative for cold intolerance, heat intolerance, polydipsia, polyphagia and polyuria.  Genitourinary: Negative for dysuria, flank pain and urgency.  Musculoskeletal: Positive for gait problem.       Denies pain   Skin: Negative for color change, pallor and rash.  Neurological: Negative for dizziness, light-headedness and headaches.  Psychiatric/Behavioral: Negative for agitation, confusion and sleep disturbance. The patient is not  nervous/anxious.     Immunization History  Administered Date(s) Administered  . Influenza Whole 12/27/2011, 11/05/2012  . Influenza-Unspecified 11/18/2013, 11/03/2014, 11/16/2015, 11/13/2016, 11/17/2017  . PPD Test 04/04/2008, 11/23/2014  . Pneumococcal Conjugate-13 09/30/2016  . Pneumococcal Polysaccharide-23 02/05/2008, 06/03/2008  . Td 02/05/2008, 06/03/2008   Pertinent  Health Maintenance Due  Topic Date Due  . INFLUENZA VACCINE  Completed  . DEXA SCAN  Completed  . PNA vac Low Risk Adult  Completed   Fall Risk  10/09/2017 09/23/2016 08/27/2016 05/30/2015 05/16/2015  Falls in the past year? Yes Yes Yes Yes No  Comment - - [facility] Telephone Survey: data to providers prior to load - -  Number falls in past yr: 2 or more 1 1 1  -  Comment - - [facility] Telephone Survey Actual Response = 1 - -  Injury with Fall? Yes No No Yes -  Comment broke R wrist - - - -  Risk Factor Category  - - - - -  Risk for fall  due to : - - - History of fall(s);Impaired balance/gait;Impaired mobility -  Follow up - - - Falls evaluation completed;Education provided -    Vitals:   11/27/17 0937  BP: (!) 152/69  Pulse: 68  Resp: 20  Temp: (!) 97 F (36.1 C)  TempSrc: Oral  SpO2: 97%  Weight: 132 lb 11.2 oz (60.2 kg)  Height: 4\' 8"  (1.422 m)   Body mass index is 29.75 kg/m. Physical Exam  Constitutional: She is oriented to person, place, and time.  Elderly in no acute distress   HENT:  Head: Normocephalic.  Right Ear: External ear normal.  Left Ear: External ear normal.  Mouth/Throat: Oropharynx is clear and moist. No oropharyngeal exudate.  HOH  Eyes: Pupils are equal, round, and reactive to light. Conjunctivae and EOM are normal. Right eye exhibits no discharge. Left eye exhibits no discharge. No scleral icterus.  Neck: Normal range of motion. No JVD present. No thyromegaly present.  Cardiovascular: Normal rate, regular rhythm, normal heart sounds and intact distal pulses. Exam reveals no gallop and  no friction rub.  No murmur heard. Pulmonary/Chest: Effort normal and breath sounds normal. No respiratory distress. She has no wheezes. She has no rales.  Abdominal: Soft. Bowel sounds are normal. She exhibits no distension and no mass. There is no tenderness. There is no rebound and no guarding.  Musculoskeletal: She exhibits no tenderness.  Unsteady gait uses Front wheel walker with assistance but spends most time on wheelchair.bilateral lower extremities trace edema.Knee high ted hose in place.   Lymphadenopathy:    She has no cervical adenopathy.  Neurological: She is oriented to person, place, and time. Gait abnormal.  Skin: Skin is warm and dry. No rash noted. No pallor.  Purple skin discoloration to both gluteal areas has improved.no open ulcer noted.   Psychiatric: She has a normal mood and affect. Her speech is normal and behavior is normal. Judgment and thought content normal.  Nursing note and vitals reviewed.   Labs reviewed: Recent Labs    09/19/17 0655 09/20/17 0330 09/21/17 0235 09/22/17 09/29/17  NA 142 143 143 142 143  K 3.9 4.3 4.4 4.9 4.5  CL 111 112* 112*  --   --   CO2 25 25 26   --   --   GLUCOSE 108* 129* 112*  --   --   BUN 23 23 22 21 19   CREATININE 0.98 0.81 0.80 0.76 0.96  CALCIUM 8.1* 8.1* 8.4* 8.6 9.0  MG 2.3 2.3 2.2  --   --    Recent Labs    06/02/17 09/29/17  AST 12*  12 15  ALT 8  8 12   ALKPHOS 65  65 82  BILITOT 0.4 0.9  PROT  --  6.2  ALBUMIN 3.9 3.4   Recent Labs    12/05/16  09/15/17 1907 09/16/17 0407  09/19/17 0655  09/20/17 0330 09/21/17 0235 09/22/17 09/29/17  WBC 7.3   < > 16.6* 11.2*   < > 9.9  --  9.1 10.9* 10.4 9.1  NEUTROABS 4,132  --  14.0* 8.7*  --   --   --   --   --   --   --   HGB 12.7   < > 12.7 11.4*   < > 6.9*   < > 8.4* 8.9* 9.5 10.4  HCT 39   < > 40.3 37.2   < > 22.9*   < > 26.7* 28.5* 28.3 31  MCV  --    < >  93.3 94.7   < > 98.3  --  96.4 96.3 91.3 93.1  PLT 272   < > 252 251   < > 192  --  210 263  --    --    < > = values in this interval not displayed.   Lab Results  Component Value Date   TSH 1.67 09/29/2017   Lab Results  Component Value Date   HGBA1C 6.0 (H) 09/16/2017   Lab Results  Component Value Date   CHOL 211 (A) 02/14/2014   HDL 47 02/14/2014   LDLCALC 133 02/14/2014   TRIG 155 02/14/2014    Significant Diagnostic Results in last 30 days:  No results found.  Assessment/Plan 1. Poor appetite Weight stable.continue on mirtazapine 15 mg tablet daily.continue to encourage oral.continue on protein supplement and follow up with facility Registered Dietician.monitor weight.    2. Chronic bronchitis, unspecified chronic bronchitis type (HCC) Breathing stable.continue on montelukast,Advair inhaler,albuterol inhaler and Atrovent.Off oxygen via nasal cannula.   3. Acquired hypothyroidism Lab Results  Component Value Date   TSH 1.67 09/29/2017  Continue on levothyroxine 25 mcg tablet daily.monitor TSH level.   4. Major depression, chronic Mood stable.continue on escitalopram 10 mg tablet daily.continue to monitor for mood changes.  5. OAB (overactive bladder) Continue on myrbetriq 25 mg tablet daily.   6. Closed fracture of distal end of right radius, unspecified fracture morphology, sequela Pain under control.seen by Ortho 11/13/2017 right hand brace discontinued.continue with therapy and follow up with ortho as directed.  Family/ staff Communication: Reviewed plan of care with patient and facility Nurse.   Labs/tests ordered: None   Keygan Dumond C Ted Goodner, NP

## 2017-12-11 DIAGNOSIS — S52521D Torus fracture of lower end of right radius, subsequent encounter for fracture with routine healing: Secondary | ICD-10-CM | POA: Diagnosis not present

## 2017-12-11 DIAGNOSIS — Z5189 Encounter for other specified aftercare: Secondary | ICD-10-CM | POA: Diagnosis not present

## 2017-12-24 ENCOUNTER — Encounter: Payer: Self-pay | Admitting: Family Medicine

## 2017-12-24 NOTE — Progress Notes (Signed)
Opened in error; Disregard.

## 2017-12-25 ENCOUNTER — Encounter: Payer: Self-pay | Admitting: Family Medicine

## 2017-12-25 ENCOUNTER — Non-Acute Institutional Stay (SKILLED_NURSING_FACILITY): Payer: Medicare Other | Admitting: Family Medicine

## 2017-12-25 DIAGNOSIS — S72141S Displaced intertrochanteric fracture of right femur, sequela: Secondary | ICD-10-CM

## 2017-12-25 DIAGNOSIS — J42 Unspecified chronic bronchitis: Secondary | ICD-10-CM

## 2017-12-25 DIAGNOSIS — E039 Hypothyroidism, unspecified: Secondary | ICD-10-CM

## 2017-12-25 DIAGNOSIS — I739 Peripheral vascular disease, unspecified: Secondary | ICD-10-CM

## 2017-12-25 NOTE — Progress Notes (Signed)
Provider:  Alain Honey, MD Location:  [facility] Room Number: 25 Place of Service:  SNF (31)  PCP: Ngetich, Nelda Bucks, NP Patient Care Team: Ngetich, Nelda Bucks, NP as PCP - General (Family Medicine) Newton Pigg, MD as Consulting Physician (Obstetrics and Gynecology) Monna Fam, MD as Consulting Physician (Ophthalmology) Melida Quitter, MD as Consulting Physician (Otolaryngology) Myrlene Broker, MD as Consulting Physician (Urology) Melina Modena, [facility]  Extended Emergency Contact Information Primary Emergency Contact: Mecum,[name] Address: [address], [place] Phone: [phone] Work Phone: [phone] Mobile Phone: [phone] Relation: Son Secondary Emergency Contact: Teofilo [place] Phone: [phone] Work Phone: [phone] Relation: Friend  Code Status: DNR Goals of Care: Advanced Directive information Advanced Directives 12/25/2017  Does Patient Have a Medical Advance Directive? Yes  Type of Advance Directive Out of facility DNR (pink MOST or yellow form);Living will  Does patient want to make changes to medical advance directive? -  Copy of [place] in Chart? -  Pre-existing out of facility DNR order (yellow form or pink MOST form) Pink MOST form placed in chart (order not valid for inpatient use);Yellow form placed in chart (order not valid for inpatient use)      No chief complaint on file.   HPI: Patient is a 84 y.o. female seen today for medical management of chronic problems including: COPD, hypothyroidism, peripheral vascular disease, and anemia.  I encountered patient in her room this morning.  She had just finished breakfast and ate 100% of her food.  She is alert and aware and appropriate.  She denies having any pain specifically in her hip arm that she had injured with recent fall.  Nursing reports no problems.  Past  Medical History:  Diagnosis Date  . Asthma   . Carpal tunnel syndrome 03/07/2009  . Disturbance of skin sensation 02/21/2009  . Diverticulosis of colon (without mention of hemorrhage) 02/16/2009  . Fracture of upper end of left humerus 11/18/14  . 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 Hypothyroid   . Osteoarthrosis, unspecified whether generalized or localized, unspecified site 02/16/2009  . Other abnormal blood chemistry 06/19/2010   hyperglycemia  . Other and unspecified hyperlipidemia 10/24/2009  . Other atopic dermatitis and related conditions 02/16/2009  . Otosclerosis, unspecified 02/22/2003  . Pain in limb 12/05/2009  . Senile osteoporosis 06/16/2010  . Sensorineural hearing loss, unspecified 02/21/2009  . Syncope and collapse 05/30/2009  . Unspecified nasal polyp 02/21/2009  . Unspecified urinary incontinence 02/21/2009   Past Surgical History:  Procedure Laterality Date  . ABDOMINAL HYSTERECTOMY  1999   Dr. Collier Bullock  . BREAST BIOPSY  07/16/1990   benign left breast needle biopsy  Dr. Margot Chimes  . CARPAL TUNNEL RELEASE  04/07/2009   right Dr. Daylene Katayama  . CATARACT EXTRACTION W/ INTRAOCULAR LENS  IMPLANT, BILATERAL Bilateral   . INTRAMEDULLARY (IM) NAIL INTERTROCHANTERIC Right 09/17/2017   Procedure: INTRAMEDULLARY (IM) NAIL INTERTROCHANTRIC HIP;  Surgeon: Nicholes Stairs, MD;  Location: [place] [facility];  Service: Orthopedics;  Laterality: Right;  . OPEN REDUCTION INTERNAL FIXATION (ORIF) DISTAL RADIAL FRACTURE Right 09/17/2017   Procedure: OPEN REDUCTION INTERNAL FIXATION (ORIF) DISTAL RADIAL FRACTURE;  Surgeon: Iran Planas, MD;  Location: [place] [facility];  Service: Orthopedics;  Laterality: Right;  . TRIGGER FINGER RELEASE Left 08/09/2014   Procedure: RELEASE TRIGGER FINGER/A-1 PULLEY LEFT MIDDLE FINGER;  Surgeon: Daryll Brod, MD;  Location: [place];  Service: Orthopedics;  Laterality: Left;  REGIONAL/FAB    reports that she has never smoked. She has never used smokeless tobacco. She reports  that she does not drink alcohol or use drugs. Social History   Socioeconomic History  . Marital status: Widowed    Spouse name: Not on file  . Number of children: Not on file  . Years of education: Not on file  . Highest education level: Not on file  Occupational History  . Occupation: retired Sales promotion account executive  Social Needs  . Financial resource strain: Not hard at all  . Food insecurity:    Worry: Never true    Inability: Never true  . Transportation needs:    Medical: No    Non-medical: No  Tobacco Use  . Smoking status: Never Smoker  . Smokeless tobacco: Never Used  Substance and Sexual Activity  . Alcohol use: No  . Drug use: No  . Sexual activity: Never  Lifestyle  . Physical activity:    Days per week: 0 days    Minutes per session: 0 min  . Stress: To some extent  Relationships  . Social connections:    Talks on phone: More than three times a week    Gets together: Once a week    Attends religious service: Never    Active member of club or organization: No    Attends meetings of clubs or organizations: Never    Relationship status: Widowed  . Intimate partner violence:    Fear of current or ex partner: No    Emotionally abused: No    Physically abused: No    Forced sexual activity: No  Other Topics Concern  . Not on file  Social History Narrative   Lives at [facility] since 12/06/2008, moved to [place] 03/20/15   Widowed 11/2006   Walks with cane   Never smoked   Alcohol none   Exercise -walks daily    POA/Living Will    Functional Status Survey:    Family History  Problem Relation Age of Onset  . Cancer Mother        stomach  . Cancer Father        bone  . Dementia Sister   . Cancer Brother        pancreataic  . Cancer Son        pancreatic    Health Maintenance  Topic Date Due  . TETANUS/TDAP  06/04/2018  . INFLUENZA VACCINE  Completed  . DEXA SCAN  Completed  . PNA vac Low Risk Adult  Completed    Allergies  Allergen Reactions    . Sulfa Antibiotics Itching and Rash  . Amoxicillin Other (See Comments)    Per [facility] 09/15/17 Has patient had a PCN reaction causing immediate rash, facial/tongue/throat swelling, SOB or lightheadedness with hypotension: Unknown Has patient had a PCN reaction causing severe rash involving mucus membranes or skin necrosis: Unknown Has patient had a PCN reaction that required hospitalization: Unknown Has patient had a PCN reaction occurring within the last 10 years: Unknown If all of the above answers are "NO", then may proceed with Cephalosporin use.  . Avelox [Moxifloxacin Hcl In Nacl] Itching  . Doxycycline Other (See Comments)    Unknown reaction  . Hista-Tabs [Triprolidine-Pse] Other (See Comments)    Passed out  . Pyrilamine-Phenylephrine Other (See Comments)    Unknown reaction - per [facility] 09/15/17  . Septra [Sulfamethoxazole-Trimethoprim] Other (See Comments)  Unknown reaction    Outpatient Encounter Medications as of 12/25/2017  Medication Sig  . aspirin EC 81 MG tablet Take 81 mg by mouth 2 (two) times daily.  . Fluticasone-Salmeterol (ADVAIR) 250-50 MCG/DOSE AEPB Inhale 1 puff 2 (two) times daily into the lungs. Rinse mouth after use  . acetaminophen (TYLENOL) 325 MG tablet Take 650 mg by mouth every 6 (six) hours as needed.  Marland Kitchen albuterol (VENTOLIN HFA) 108 (90 Base) MCG/ACT inhaler Inhale 2 puffs into the lungs every 6 (six) hours as needed for shortness of breath (dyspnea).   . escitalopram (LEXAPRO) 10 MG tablet Take 10 mg by mouth daily.  . feeding supplement (BOOST HIGH PROTEIN) LIQD Take 100-237 mLs by mouth 2 (two) times daily.   . ferrous sulfate 325 (65 FE) MG tablet Take 325 mg by mouth daily with breakfast.  . fluticasone (FLONASE) 50 MCG/ACT nasal spray Place 2 sprays into both nostrils daily.  . furosemide (LASIX) 40 MG tablet Take 40 mg by mouth daily. If systolic B/P is <195 hold  . ipratropium (ATROVENT) 0.02 % nebulizer solution Take 0.5 mg by nebulization every  6 (six) hours as needed for wheezing or shortness of breath.  . levothyroxine (SYNTHROID, LEVOTHROID) 25 MCG tablet Take 25 mcg by mouth daily before breakfast.  . mirtazapine (REMERON) 15 MG tablet Take 15 mg by mouth every evening.   . montelukast (SINGULAIR) 10 MG tablet Take one tablet by mouth once daily to help breathing  . MYRBETRIQ 25 MG TB24 tablet TAKE ONE TABLET BY MOUTH ONCE DAILY  . OXYGEN Inhale 2 L/min into the lungs as needed (to keep O2 SATS >90%).   . polyethylene glycol (MIRALAX / GLYCOLAX) packet Take 17 g by mouth daily.  . raloxifene (EVISTA) 60 MG tablet Take 1 tablet by mouth  daily to treat osteoporosis  . ranitidine (ZANTAC) 150 MG tablet Take 150 mg by mouth daily.  Marland Kitchen zinc oxide 20 % ointment Apply 1 application topically 2 (two) times daily. Also apply every Shift - PRN  Apply to buttocks as needed every shift after incontinent episodes   No facility-administered encounter medications on file as of 12/25/2017.     Review of Systems  Constitutional: Negative.   HENT: Positive for hearing loss.   Respiratory: Positive for shortness of breath.   Cardiovascular: Positive for leg swelling.  Musculoskeletal: Positive for arthralgias and gait problem.  Psychiatric/Behavioral: Negative.     Vitals:   12/25/17 0926  BP: (!) 137/59  Pulse: (!) 56  Resp: 16  Temp: (!) 97.4 F (36.3 C)  TempSrc: Oral  SpO2: 95%  Weight: 142 lb 3.2 oz (64.5 kg)  Height: 4\' 8"  (1.422 m)   Body mass index is 31.88 kg/m. Physical Exam  Constitutional: She is oriented to person, place, and time. She appears well-developed and well-nourished.  HENT:  Head: Normocephalic.  Mouth/Throat: Oropharynx is clear and moist.  Neck: Normal range of motion. Neck supple.  Cardiovascular: Normal rate and regular rhythm.  Pulmonary/Chest: Effort normal and breath sounds normal.  Abdominal: Soft. Bowel sounds are normal.  Musculoskeletal: Normal range of motion.  Ambulates mostly in  wheelchair but can use walker  Neurological: She is alert and oriented to person, place, and time.  Skin: Skin is warm and dry.  Psychiatric: She has a normal mood and affect. Her behavior is normal. Thought content normal.  Nursing note and vitals reviewed.   Labs reviewed: Basic Metabolic Panel: Recent Labs    09/19/17 0655 09/20/17  0330 09/21/17 0235 09/22/17 09/29/17  NA 142 143 143 142 143  K 3.9 4.3 4.4 4.9 4.5  CL 111 112* 112*  --   --   CO2 25 25 26   --   --   GLUCOSE 108* 129* 112*  --   --   BUN 23 23 22 21 19   CREATININE 0.98 0.81 0.80 0.76 0.96  CALCIUM 8.1* 8.1* 8.4* 8.6 9.0  MG 2.3 2.3 2.2  --   --    Liver Function Tests: Recent Labs    06/02/17 09/29/17  AST 12*  12 15  ALT 8  8 12   ALKPHOS 65  65 82  BILITOT 0.4 0.9  PROT  --  6.2  ALBUMIN 3.9 3.4   No results for input(s): LIPASE, AMYLASE in the last 8760 hours. No results for input(s): AMMONIA in the last 8760 hours. CBC: Recent Labs    09/15/17 1907 09/16/17 0407  09/19/17 0655  09/20/17 0330 09/21/17 0235 09/22/17 09/29/17  WBC 16.6* 11.2*   < > 9.9  --  9.1 10.9* 10.4 9.1  NEUTROABS 14.0* 8.7*  --   --   --   --   --   --   --   HGB 12.7 11.4*   < > 6.9*   < > 8.4* 8.9* 9.5 10.4  HCT 40.3 37.2   < > 22.9*   < > 26.7* 28.5* 28.3 31  MCV 93.3 94.7   < > 98.3  --  96.4 96.3 91.3 93.1  PLT 252 251   < > 192  --  210 263  --   --    < > = values in this interval not displayed.   Cardiac Enzymes: No results for input(s): CKTOTAL, CKMB, CKMBINDEX, TROPONINI in the last 8760 hours. BNP: Invalid input(s): POCBNP Lab Results  Component Value Date   HGBA1C 6.0 (H) 09/16/2017   Lab Results  Component Value Date   TSH 1.67 09/29/2017   Lab Results  Component Value Date   VITAMINB12 399 09/18/2017   Lab Results  Component Value Date   FOLATE 13.2 09/18/2017   Lab Results  Component Value Date   IRON 12 (L) 09/18/2017   TIBC 269 09/18/2017   FERRITIN 50 09/18/2017    Imaging  and Procedures obtained prior to SNF admission: Dg Chest 1 View  Result Date: 09/15/2017 CLINICAL DATA:  Right shoulder pain. EXAM: CHEST  1 VIEW COMPARISON:  04/05/2009 FINDINGS: Hyperexpansion is consistent with emphysema. Cardiopericardial silhouette is at upper limits of normal for size. Bones are diffusely demineralized. Telemetry leads overlie the chest. IMPRESSION: Chronic changes without acute findings. Electronically Signed   By: Misty Stanley M.D.   On: 09/15/2017 19:14   Dg Shoulder Right  Result Date: 09/15/2017 CLINICAL DATA:  Fall, right shoulder pain EXAM: RIGHT SHOULDER - 2+ VIEW COMPARISON:  None. FINDINGS: Degenerative changes in the [facility] joint with joint space narrowing and spurring. Glenohumeral joint is maintained. No acute bony abnormality. Specifically, no fracture, subluxation, or dislocation. Soft tissues are intact. IMPRESSION: No acute bony abnormality. Electronically Signed   By: Rolm Baptise M.D.   On: 09/15/2017 19:14   Dg Wrist Complete Right  Result Date: 09/15/2017 CLINICAL DATA:  Fall with wrist deformity EXAM: RIGHT WRIST - COMPLETE 3+ VIEW COMPARISON:  None. FINDINGS: There is a comminuted, predominantly transverse fracture of the distal right radius with moderate lateral displacement. There is also a minimally displaced fracture of the distal left ulna, involving  the ulnar styloid. There is severe first carpometacarpal joint osteoarthrosis. No carpal fracture is identified. IMPRESSION: 1. Comminuted fracture of the distal right radius with lateral displacement. 2. Nondisplaced fracture of the distal left ulna. 3. Severe first CMC joint osteoarthrosis. Electronically Signed   By: Ulyses Jarred M.D.   On: 09/15/2017 20:31   Ct Head Wo Contrast  Result Date: 09/15/2017 CLINICAL DATA:  84 year old who fell in her bathroom earlier today and was unable to get up on her own. It is unclear how long the patient was on the floor. Initial encounter. EXAM: CT HEAD WITHOUT  CONTRAST TECHNIQUE: Contiguous axial images were obtained from the base of the skull through the vertex without intravenous contrast. COMPARISON:  11/06/2014, 04/25/2011, 09/11/2006. FINDINGS: Brain: Significant head tilt in the gantry accounts for apparent asymmetry in the cerebral hemispheres. Moderate to severe age appropriate cortical, deep and cerebellar atrophy, unchanged. Severe changes of small vessel disease of the white matter diffusely, unchanged. No mass lesion. No midline shift. No acute hemorrhage or hematoma. No extra-axial fluid collections. No evidence of acute infarction. Vascular: Moderate BILATERAL carotid siphon atherosclerosis. No hyperdense vessel. Skull: No skull fracture or other focal osseous abnormality involving the skull. Sinuses/Orbits: Near complete opacification of the RIGHT maxillary sinus with hyperdense material in the sinus. Mucosal thickening involving the LEFT maxillary sinus and the LEFT sphenoid sinus. Opacification of multiple BILATERAL ethmoid air cells. RIGHT sphenoid sinus in both frontal sinuses well aerated. BILATERAL mastoid air cells and BILATERAL middle ear cavities well-aerated. Visualized orbits and globes normal in appearance. Other: None. IMPRESSION: 1. No acute intracranial abnormality. 2. Moderate to severe age-appropriate generalized atrophy and severe chronic microvascular ischemic changes of the white matter. 3. Chronic pansinusitis. Query superimposed chronic fungal infection in the RIGHT maxillary sinus. Electronically Signed   By: Evangeline Dakin M.D.   On: 09/15/2017 20:12   Dg Hip Unilat With Pelvis 2-3 Views Right  Result Date: 09/15/2017 CLINICAL DATA:  Fall, right hip pain EXAM: DG HIP (WITH OR WITHOUT PELVIS) 2-3V RIGHT COMPARISON:  None. FINDINGS: Right femoral intertrochanteric fracture noted with varus angulation. Lesser trochanter is displaced. No subluxation or dislocation. SI joints are symmetric and unremarkable. IMPRESSION: Right femoral  intertrochanteric fracture with varus angulation and displacement of fracture fragments. Electronically Signed   By: Rolm Baptise M.D.   On: 09/15/2017 19:14    Assessment/Plan 1. PVD (peripheral vascular disease) ([place]) Patient takes Lasix for edema.  Today, early in the morning.  No edema present  2. Chronic bronchitis, unspecified chronic bronchitis type ([place]) No complaints of breathing but patient is maintained on several inhalers with long-acting bronchodilator and steroid as well as Atrovent.  No recent exacerbations  3. Acquired hypothyroidism Takes low-dose levothyroxine.  Most recent TSH in August was in therapeutic range  4. Closed displaced intertrochanteric fracture of right femur, sequela Patient denies pain.  Gets around mostly in her wheelchair but sometimes uses walker   Family/ staff Communication: Findings and recommendations relayed to patient  Labs/tests ordered:  Lillette Boxer. [place], [place] [address] [place], [place] Office [phone]

## 2017-12-29 ENCOUNTER — Non-Acute Institutional Stay (SKILLED_NURSING_FACILITY): Payer: Medicare Other | Admitting: Family

## 2017-12-29 ENCOUNTER — Encounter: Payer: Self-pay | Admitting: Family

## 2017-12-29 DIAGNOSIS — F339 Major depressive disorder, recurrent, unspecified: Secondary | ICD-10-CM

## 2017-12-29 NOTE — Progress Notes (Signed)
Location:  [place] Room Number: 25A Place of Service:  SNF (31) Provider:   FNP-C  , Nelda Bucks, NP  Patient Care Team: , Nelda Bucks, NP as PCP - General (Family Medicine) Newton Pigg, MD as Consulting Physician (Obstetrics and Gynecology) Monna Fam, MD as Consulting Physician (Ophthalmology) Melida Quitter, MD as Consulting Physician (Otolaryngology) Myrlene Broker, MD as Consulting Physician (Urology) Melina Modena, [facility]  Extended Emergency Contact Information Primary Emergency Contact: Brasher,[name] Address: [place], [place] Phone: [phone] Work Phone: [phone] Mobile Phone: [phone] Relation: Son Secondary Emergency Contact: Teofilo [place] of [place] Phone: [phone] Work Phone: [phone] Relation: Friend  Code Status:  DNR Goals of care: Advanced Directive information Advanced Directives 12/29/2017  Does Patient Have a Medical Advance Directive? Yes  Type of Paramedic of [place];Living will;Out of facility DNR (pink MOST or yellow form)  Does patient want to make changes to medical advance directive? No - Patient declined  Copy of [place] in Chart? Yes - validated most recent copy scanned in chart (See row information)  Pre-existing out of facility DNR order (yellow form or pink MOST form) Yellow form placed in chart (order not valid for inpatient use);Pink MOST form placed in chart (order not valid for inpatient use)     Chief Complaint  Patient presents with  . Acute Visit    Depressive behavior     HPI:  Pt is a 84 y.o. female seen today at [facility] for an acute visit for evaluation of depression.she is seen in her room today per facility Nurse request.Nurse reports via SBAR that patient seems to be more depressed "  just wants to be left alone to die".Of  note she has a significant medical history of depression currently on lexapro 10 mg tablet daily.she denies any suicidal ideation though states does not want to eat.No fever,chills,cough or signs of urinary tract infections reported.Nurse and CNA states patient has refused to get out of bed today.       Past Medical History:  Diagnosis Date  . Asthma   . Carpal tunnel syndrome 03/07/2009  . Disturbance of skin sensation 02/21/2009  . Diverticulosis of colon (without mention of hemorrhage) 02/16/2009  . Fracture of upper end of left humerus 11/18/14  . 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 Hypothyroid   . Osteoarthrosis, unspecified whether generalized or localized, unspecified site 02/16/2009  . Other abnormal blood chemistry 06/19/2010   hyperglycemia  . Other and unspecified hyperlipidemia 10/24/2009  . Other atopic dermatitis and related conditions 02/16/2009  . Otosclerosis, unspecified 02/22/2003  . Pain in limb 12/05/2009  . Senile osteoporosis 06/16/2010  . Sensorineural hearing loss, unspecified 02/21/2009  . Syncope and collapse 05/30/2009  . Unspecified nasal polyp 02/21/2009  . Unspecified urinary incontinence 02/21/2009   Past Surgical History:  Procedure Laterality Date  . ABDOMINAL HYSTERECTOMY  1999   Dr. Collier Bullock  . BREAST BIOPSY  07/16/1990   benign left breast needle biopsy  Dr. Margot Chimes  . CARPAL TUNNEL RELEASE  04/07/2009   right Dr. Daylene Katayama  . CATARACT EXTRACTION W/ INTRAOCULAR LENS  IMPLANT, BILATERAL Bilateral   . INTRAMEDULLARY (IM) NAIL INTERTROCHANTERIC Right 09/17/2017   Procedure: INTRAMEDULLARY (IM) NAIL INTERTROCHANTRIC HIP;  Surgeon: Nicholes Stairs, MD;  Location: [place];  Service: Orthopedics;  Laterality: Right;  . OPEN REDUCTION  INTERNAL FIXATION (ORIF) DISTAL RADIAL FRACTURE Right 09/17/2017   Procedure: OPEN REDUCTION INTERNAL FIXATION (ORIF) DISTAL RADIAL FRACTURE;  Surgeon: Iran Planas, MD;  Location: [place];  Service: Orthopedics;  Laterality: Right;  .  TRIGGER FINGER RELEASE Left 08/09/2014   Procedure: RELEASE TRIGGER FINGER/A-1 PULLEY LEFT MIDDLE FINGER;  Surgeon: Daryll Brod, MD;  Location: [place];  Service: Orthopedics;  Laterality: Left;  REGIONAL/FAB    Allergies  Allergen Reactions  . Sulfa Antibiotics Itching and Rash  . Amoxicillin Other (See Comments)    Per [facility] 09/15/17 Has patient had a PCN reaction causing immediate rash, facial/tongue/throat swelling, SOB or lightheadedness with hypotension: Unknown Has patient had a PCN reaction causing severe rash involving mucus membranes or skin necrosis: Unknown Has patient had a PCN reaction that required hospitalization: Unknown Has patient had a PCN reaction occurring within the last 10 years: Unknown If all of the above answers are "NO", then may proceed with Cephalosporin use.  . Avelox [Moxifloxacin Hcl In Nacl] Itching  . Doxycycline Other (See Comments)    Unknown reaction  . Hista-Tabs [Triprolidine-Pse] Other (See Comments)    Passed out  . Pyrilamine-Phenylephrine Other (See Comments)    Unknown reaction - per [facility] 09/15/17  . Septra [Sulfamethoxazole-Trimethoprim] Other (See Comments)    Unknown reaction    Outpatient Encounter Medications as of 12/29/2017  Medication Sig  . albuterol (VENTOLIN HFA) 108 (90 Base) MCG/ACT inhaler Inhale 2 puffs into the lungs every 6 (six) hours as needed for shortness of breath (dyspnea).   Marland Kitchen aspirin EC 81 MG tablet Take 81 mg by mouth 2 (two) times daily.  Marland Kitchen escitalopram (LEXAPRO) 10 MG tablet Take 10 mg by mouth daily.  . feeding supplement (BOOST HIGH PROTEIN) LIQD Take 100-237 mLs by mouth 2 (two) times daily.   . ferrous sulfate 325 (65 FE) MG tablet Take 325 mg by mouth daily with breakfast.  . Fluticasone-Salmeterol (ADVAIR) 250-50 MCG/DOSE AEPB Inhale 1 puff 2 (two) times daily into the lungs. Rinse mouth after use  . furosemide (LASIX) 40 MG tablet Take 40 mg by mouth daily. If systolic B/P is <154 hold  .  ipratropium (ATROVENT) 0.02 % nebulizer solution Take 0.5 mg by nebulization every 6 (six) hours as needed for wheezing or shortness of breath.  . levothyroxine (SYNTHROID, LEVOTHROID) 25 MCG tablet Take 25 mcg by mouth daily before breakfast.  . mirtazapine (REMERON) 15 MG tablet Take 15 mg by mouth every evening.   . montelukast (SINGULAIR) 10 MG tablet Take one tablet by mouth once daily to help breathing  . MYRBETRIQ 25 MG TB24 tablet TAKE ONE TABLET BY MOUTH ONCE DAILY  . OXYGEN Inhale 2 L/min into the lungs as needed (to keep O2 SATS >90%).   . polyethylene glycol (MIRALAX / GLYCOLAX) packet Take 17 g by mouth daily.  . raloxifene (EVISTA) 60 MG tablet Take 1 tablet by mouth  daily to treat osteoporosis  . ranitidine (ZANTAC) 150 MG tablet Take 150 mg by mouth daily.  Marland Kitchen zinc oxide 20 % ointment Apply 1 application topically 2 (two) times daily. Also apply every Shift - PRN  Apply to buttocks as needed every shift after incontinent episodes  . acetaminophen (TYLENOL) 325 MG tablet Take 650 mg by mouth every 6 (six) hours as needed.  . fluticasone (FLONASE) 50 MCG/ACT nasal spray Place 2 sprays into both nostrils daily.   No facility-administered encounter medications on file as of 12/29/2017.     Review of  Systems  Constitutional: Negative for appetite change, chills, fatigue and fever.  HENT: Positive for hearing loss. Negative for congestion, rhinorrhea, sinus pressure, sinus pain, sneezing and sore throat.   Eyes: Positive for visual disturbance. Negative for discharge, redness and itching.  Respiratory: Negative for cough, chest tightness, shortness of breath and wheezing.   Cardiovascular: Negative for chest pain, palpitations and leg swelling.  Gastrointestinal: Negative for abdominal distention, abdominal pain, constipation, diarrhea, nausea and vomiting.  Endocrine: Negative for cold intolerance, heat intolerance, polydipsia, polyphagia and polyuria.  Genitourinary: Negative for  dysuria, flank pain and urgency.  Musculoskeletal: Positive for gait problem.  Skin: Negative for color change, pallor, rash and wound.  Neurological: Negative for dizziness, light-headedness, numbness and headaches.  Psychiatric/Behavioral: Positive for confusion. Negative for agitation, sleep disturbance and suicidal ideas. The patient is not nervous/anxious.     Immunization History  Administered Date(s) Administered  . Influenza Whole 12/27/2011, 11/05/2012  . Influenza-Unspecified 11/18/2013, 11/03/2014, 11/16/2015, 11/13/2016, 11/17/2017  . PPD Test 04/04/2008, 11/23/2014  . Pneumococcal Conjugate-13 09/30/2016  . Pneumococcal Polysaccharide-23 02/05/2008, 06/03/2008  . Td 02/05/2008, 06/03/2008   Pertinent  Health Maintenance Due  Topic Date Due  . INFLUENZA VACCINE  Completed  . DEXA SCAN  Completed  . PNA vac Low Risk Adult  Completed   Fall Risk  10/09/2017 09/23/2016 08/27/2016 05/30/2015 05/16/2015  Falls in the past year? Yes Yes Yes Yes No  Comment - - Emmi Telephone Survey: data to providers prior to load - -  Number falls in past yr: 2 or more 1 1 1  -  Comment - - Emmi Telephone Survey Actual Response = 1 - -  Injury with Fall? Yes No No Yes -  Comment broke R wrist - - - -  Risk Factor Category  - - - - -  Risk for fall due to : - - - History of fall(s);Impaired balance/gait;Impaired mobility -  Follow up - - - Falls evaluation completed;Education provided -    Vitals:   12/29/17 1131  BP: (!) 118/55  Pulse: 61  Resp: 16  Temp: (!) 97.4 F (36.3 C)  SpO2: 95%  Weight: 142 lb 3.2 oz (64.5 kg)  Height: 4\' 8"  (1.422 m)   Body mass index is 31.88 kg/m. Physical Exam  Constitutional: She appears well-developed and well-nourished.  Elderly lying in bed in no acute distress   HENT:  Head: Normocephalic.  Right Ear: External ear normal.  Left Ear: External ear normal.  Mouth/Throat: Oropharynx is clear and moist. No oropharyngeal exudate.  Eyes: Pupils are  equal, round, and reactive to light. Conjunctivae and EOM are normal. Right eye exhibits no discharge. Left eye exhibits no discharge. No scleral icterus.  Neck: Normal range of motion. No JVD present. No thyromegaly present.  Cardiovascular: Normal rate, regular rhythm, normal heart sounds and intact distal pulses. Exam reveals no gallop and no friction rub.  No murmur heard. Pulmonary/Chest: Effort normal and breath sounds normal. No respiratory distress. She has no wheezes. She has no rales.  Abdominal: Soft. Bowel sounds are normal. She exhibits no distension and no mass. There is no tenderness. There is no rebound and no guarding.  Musculoskeletal: She exhibits no edema or tenderness.  Moves x 4 extremities.requires assistance with transfers.  Lymphadenopathy:    She has no cervical adenopathy.  Neurological:  Alert and oriented to person and place   Skin: Skin is warm and dry. No rash noted. No erythema. No pallor.  Psychiatric: Her speech is normal  and behavior is normal. Judgment and thought content normal. Her mood appears not anxious. Her affect is not inappropriate. She exhibits a depressed mood. She exhibits abnormal recent memory.  Nursing note and vitals reviewed.   Labs reviewed: Recent Labs    09/19/17 0655 09/20/17 0330 09/21/17 0235 09/22/17 09/29/17  NA 142 143 143 142 143  K 3.9 4.3 4.4 4.9 4.5  CL 111 112* 112*  --   --   CO2 25 25 26   --   --   GLUCOSE 108* 129* 112*  --   --   BUN 23 23 22 21 19   CREATININE 0.98 0.81 0.80 0.76 0.96  CALCIUM 8.1* 8.1* 8.4* 8.6 9.0  MG 2.3 2.3 2.2  --   --    Recent Labs    06/02/17 09/29/17  AST 12*  12 15  ALT 8  8 12   ALKPHOS 65  65 82  BILITOT 0.4 0.9  PROT  --  6.2  ALBUMIN 3.9 3.4   Recent Labs    09/15/17 1907 09/16/17 0407  09/19/17 0655  09/20/17 0330 09/21/17 0235 09/22/17 09/29/17  WBC 16.6* 11.2*   < > 9.9  --  9.1 10.9* 10.4 9.1  NEUTROABS 14.0* 8.7*  --   --   --   --   --   --   --   HGB 12.7  11.4*   < > 6.9*   < > 8.4* 8.9* 9.5 10.4  HCT 40.3 37.2   < > 22.9*   < > 26.7* 28.5* 28.3 31  MCV 93.3 94.7   < > 98.3  --  96.4 96.3 91.3 93.1  PLT 252 251   < > 192  --  210 263  --   --    < > = values in this interval not displayed.   Lab Results  Component Value Date   TSH 1.67 09/29/2017   Lab Results  Component Value Date   HGBA1C 6.0 (H) 09/16/2017   Lab Results  Component Value Date   CHOL 211 (A) 02/14/2014   HDL 47 02/14/2014   LDLCALC 133 02/14/2014   TRIG 155 02/14/2014    Significant Diagnostic Results in last 30 days:  No results found.  Assessment/Plan   Depression, recurrent (HCC) Afebrile.mood depressed and requested to be left alone to die.No suicidal ideation.will increase lexapro from 10 mg tablet daily to 20 mg tablet daily.continue to monitor for mood changes.    Family/ staff Communication: Reviewed plan of care with patient and facility Nurse   Labs/tests ordered: None   Nelda Bucks , NP

## 2018-01-14 ENCOUNTER — Encounter: Payer: Self-pay | Admitting: Family

## 2018-02-10 DIAGNOSIS — S72141D Displaced intertrochanteric fracture of right femur, subsequent encounter for closed fracture with routine healing: Secondary | ICD-10-CM | POA: Diagnosis not present

## 2018-02-17 ENCOUNTER — Encounter: Payer: Self-pay | Admitting: Nurse Practitioner

## 2018-02-17 ENCOUNTER — Non-Acute Institutional Stay (SKILLED_NURSING_FACILITY): Payer: Medicare Other | Admitting: Nurse Practitioner

## 2018-02-17 DIAGNOSIS — K5900 Constipation, unspecified: Secondary | ICD-10-CM | POA: Diagnosis not present

## 2018-02-17 DIAGNOSIS — F339 Major depressive disorder, recurrent, unspecified: Secondary | ICD-10-CM | POA: Diagnosis not present

## 2018-02-17 DIAGNOSIS — M150 Primary generalized (osteo)arthritis: Secondary | ICD-10-CM

## 2018-02-17 DIAGNOSIS — J42 Unspecified chronic bronchitis: Secondary | ICD-10-CM | POA: Diagnosis not present

## 2018-02-17 DIAGNOSIS — R609 Edema, unspecified: Secondary | ICD-10-CM

## 2018-02-17 DIAGNOSIS — E039 Hypothyroidism, unspecified: Secondary | ICD-10-CM | POA: Diagnosis not present

## 2018-02-17 DIAGNOSIS — K219 Gastro-esophageal reflux disease without esophagitis: Secondary | ICD-10-CM

## 2018-02-17 DIAGNOSIS — M159 Polyosteoarthritis, unspecified: Secondary | ICD-10-CM

## 2018-02-17 NOTE — Progress Notes (Signed)
Location:  [place] Room Number: 25 Place of Service:  SNF (31) Provider:  Marlana Latus  NP  Smitty Ackerley X, NP  Patient Care Team: Dishon Kehoe X, NP as PCP - General (Internal Medicine) Newton Pigg, MD as Consulting Physician (Obstetrics and Gynecology) Monna Fam, MD as Consulting Physician (Ophthalmology) Melida Quitter, MD as Consulting Physician (Otolaryngology) Myrlene Broker, MD as Consulting Physician (Urology) Melina Modena, [facility]  Extended Emergency Contact Information Primary Emergency Contact: Myrie,[name] Address: [place], [place] Phone: [phone] Work Phone: [phone] Mobile Phone: [phone] Relation: Son Secondary Emergency Contact: Teofilo [place] Phone: [phone] Work Phone: [phone] Relation: Friend  Code Status:  DNR Goals of care: Advanced Directive information Advanced Directives 02/17/2018  Does Patient Have a Medical Advance Directive? Yes  Type of Paramedic of [place];Living will;Out of facility DNR (pink MOST or yellow form)  Does patient want to make changes to medical advance directive? No - Patient declined  Copy of [place] in Chart? Yes - validated most recent copy scanned in chart (See row information)  Pre-existing out of facility DNR order (yellow form or pink MOST form) Yellow form placed in chart (order not valid for inpatient use);Pink MOST form placed in chart (order not valid for inpatient use)     Chief Complaint  Patient presents with  . Medical Management of Chronic Issues    HPI:  Pt is a 85 y.o. female seen today for medical management of chronic diseases.     The patient resides in [facility] for safety and care assistance. No constipation while on MiraLax daily. Her mood is stable on Mirtazapine 7.5mg  qd, Escitalopram 20mg  qd.  Hypothyroidism, on  Levothyroxine 62mcg qd, last TSH 1.67 09/29/17. Edema.  compensated, on Furosemide 40mg  qd. COPD, stable on Advair bid. Anemia, stable, on Fe, last Hgb 10.4 09/29/17. GERD stable on Famotidine 20mg  qd. OA pain is managed with Tylenol 1000mg  tid.   Past Medical History:  Diagnosis Date  . Asthma   . Carpal tunnel syndrome 03/07/2009  . Disturbance of skin sensation 02/21/2009  . Diverticulosis of colon (without mention of hemorrhage) 02/16/2009  . Fracture of upper end of left humerus 11/18/14  . 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 Hypothyroid   . Osteoarthrosis, unspecified whether generalized or localized, unspecified site 02/16/2009  . Other abnormal blood chemistry 06/19/2010   hyperglycemia  . Other and unspecified hyperlipidemia 10/24/2009  . Other atopic dermatitis and related conditions 02/16/2009  . Otosclerosis, unspecified 02/22/2003  . Pain in limb 12/05/2009  . Senile osteoporosis 06/16/2010  . Sensorineural hearing loss, unspecified 02/21/2009  . Syncope and collapse 05/30/2009  . Unspecified nasal polyp 02/21/2009  . Unspecified urinary incontinence 02/21/2009   Past Surgical History:  Procedure Laterality Date  . ABDOMINAL HYSTERECTOMY  1999   Dr. Collier Bullock  . BREAST BIOPSY  07/16/1990   benign left breast needle biopsy  Dr. Margot Chimes  . CARPAL TUNNEL RELEASE  04/07/2009   right Dr. Daylene Katayama  . CATARACT EXTRACTION W/ INTRAOCULAR LENS  IMPLANT, BILATERAL Bilateral   . INTRAMEDULLARY (IM) NAIL INTERTROCHANTERIC Right 09/17/2017   Procedure: INTRAMEDULLARY (IM) NAIL INTERTROCHANTRIC HIP;  Surgeon: Nicholes Stairs, MD;  Location: [place];  Service: Orthopedics;  Laterality: Right;  . OPEN REDUCTION INTERNAL FIXATION (ORIF) DISTAL RADIAL FRACTURE Right 09/17/2017   Procedure: OPEN  REDUCTION INTERNAL FIXATION (ORIF) DISTAL RADIAL FRACTURE;  Surgeon: Iran Planas, MD;  Location: [place];  Service: Orthopedics;  Laterality: Right;  . TRIGGER FINGER RELEASE Left 08/09/2014   Procedure: RELEASE  TRIGGER FINGER/A-1 PULLEY LEFT MIDDLE FINGER;  Surgeon: Daryll Brod, MD;  Location: [place];  Service: Orthopedics;  Laterality: Left;  REGIONAL/FAB    Allergies  Allergen Reactions  . Sulfa Antibiotics Itching and Rash  . Amoxicillin Other (See Comments)    Per [facility] 09/15/17 Has patient had a PCN reaction causing immediate rash, facial/tongue/throat swelling, SOB or lightheadedness with hypotension: Unknown Has patient had a PCN reaction causing severe rash involving mucus membranes or skin necrosis: Unknown Has patient had a PCN reaction that required hospitalization: Unknown Has patient had a PCN reaction occurring within the last 10 years: Unknown If all of the above answers are "NO", then may proceed with Cephalosporin use.  . Avelox [Moxifloxacin Hcl In Nacl] Itching  . Doxycycline Other (See Comments)    Unknown reaction  . Hista-Tabs [Triprolidine-Pse] Other (See Comments)    Passed out  . Pyrilamine-Phenylephrine Other (See Comments)    Unknown reaction - per [facility] 09/15/17  . Septra [Sulfamethoxazole-Trimethoprim] Other (See Comments)    Unknown reaction    Outpatient Encounter Medications as of 02/17/2018  Medication Sig  . acetaminophen (TYLENOL) 500 MG tablet Take 1,000 mg by mouth 3 (three) times daily.  Marland Kitchen albuterol (VENTOLIN HFA) 108 (90 Base) MCG/ACT inhaler Inhale 2 puffs into the lungs every 6 (six) hours as needed for shortness of breath (dyspnea).   Marland Kitchen aspirin EC 81 MG tablet Take 81 mg by mouth daily. On Tuesday and Saturday  . escitalopram (LEXAPRO) 10 MG tablet Take 20 mg by mouth daily. Take 2 tablets  to equal 20 mg.  . famotidine (PEPCID) 20 MG tablet Take 20 mg by mouth daily.  . feeding supplement (BOOST HIGH PROTEIN) LIQD Take 100-237 mLs by mouth 2 (two) times daily.   . ferrous sulfate 325 (65 FE) MG tablet Take 325 mg by mouth daily with breakfast.  . Fluticasone-Salmeterol (ADVAIR) 250-50 MCG/DOSE AEPB Inhale 1 puff 2 (two) times daily into  the lungs. Rinse mouth after use  . furosemide (LASIX) 40 MG tablet Take 40 mg by mouth daily. If systolic B/P is <761 hold  . ipratropium (ATROVENT) 0.02 % nebulizer solution Take 0.5 mg by nebulization every 6 (six) hours as needed for wheezing or shortness of breath.  . levothyroxine (SYNTHROID, LEVOTHROID) 25 MCG tablet Take 25 mcg by mouth daily before breakfast.  . mirtazapine (REMERON) 7.5 MG tablet Take 7.5 mg by mouth daily.  . OXYGEN Inhale 2 L/min into the lungs as needed (to keep O2 SATS >90%).   . polyethylene glycol (MIRALAX / GLYCOLAX) packet Take 17 g by mouth daily.  . raloxifene (EVISTA) 60 MG tablet Take 1 tablet by mouth  daily to treat osteoporosis  . zinc oxide 20 % ointment Apply 1 application topically 2 (two) times daily. Also apply every Shift - PRN  Apply to buttocks as needed every shift after incontinent episodes   No facility-administered encounter medications on file as of 02/17/2018.    ROS was provided with assistance of staff Review of Systems  Constitutional: Negative for activity change, appetite change, chills, diaphoresis, fatigue, fever and unexpected weight change.  HENT: Positive for hearing loss. Negative for congestion and voice change.   Respiratory: Negative for cough, shortness of breath and wheezing.   Cardiovascular: Positive for leg swelling.  Negative for chest pain and palpitations.  Gastrointestinal: Negative for abdominal distention, abdominal pain, constipation, diarrhea, nausea and vomiting.  Genitourinary: Negative for difficulty urinating, dysuria and urgency.  Musculoskeletal: Positive for arthralgias and gait problem.  Skin: Negative for color change and pallor.  Neurological: Negative for dizziness, speech difficulty, weakness and headaches.       Dementia  Psychiatric/Behavioral: Negative for agitation, behavioral problems, hallucinations and sleep disturbance. The patient is not nervous/anxious.     Immunization History    Administered Date(s) Administered  . Influenza Whole 12/27/2011, 11/05/2012  . Influenza-Unspecified 11/18/2013, 11/03/2014, 11/16/2015, 11/13/2016, 11/17/2017  . PPD Test 04/04/2008, 11/23/2014  . Pneumococcal Conjugate-13 09/30/2016  . Pneumococcal Polysaccharide-23 02/05/2008, 06/03/2008  . Td 02/05/2008, 06/03/2008   Pertinent  Health Maintenance Due  Topic Date Due  . INFLUENZA VACCINE  Completed  . DEXA SCAN  Completed  . PNA vac Low Risk Adult  Completed   Fall Risk  10/09/2017 09/23/2016 08/27/2016 05/30/2015 05/16/2015  Falls in the past year? Yes Yes Yes Yes No  Comment - - Emmi Telephone Survey: data to providers prior to load - -  Number falls in past yr: 2 or more 1 1 1  -  Comment - - Emmi Telephone Survey Actual Response = 1 - -  Injury with Fall? Yes No No Yes -  Comment broke R wrist - - - -  Risk Factor Category  - - - - -  Risk for fall due to : - - - History of fall(s);Impaired balance/gait;Impaired mobility -  Follow up - - - Falls evaluation completed;Education provided -   Functional Status Survey:    Vitals:   02/19/18 0804  BP: (!) 103/54  Pulse: 62  Resp: 18  Temp: (!) 97.1 F (36.2 C)  SpO2: 96%  Weight: 134 lb 11.2 oz (61.1 kg)  Height: 4\' 8"  (1.422 m)   Body mass index is 30.2 kg/m. Physical Exam Constitutional:      General: She is not in acute distress.    Appearance: Normal appearance. She is not ill-appearing, toxic-appearing or diaphoretic.  HENT:     Head: Normocephalic and atraumatic.     Nose: Nose normal.     Mouth/Throat:     Mouth: Mucous membranes are moist.  Eyes:     Extraocular Movements: Extraocular movements intact.     Pupils: Pupils are equal, round, and reactive to light.  Neck:     Musculoskeletal: Normal range of motion and neck supple.  Cardiovascular:     Rate and Rhythm: Normal rate and regular rhythm.     Heart sounds: No murmur.  Pulmonary:     Breath sounds: No wheezing, rhonchi or rales.  Abdominal:      General: There is no distension.     Palpations: Abdomen is soft.     Tenderness: There is no abdominal tenderness. There is no guarding or rebound.  Musculoskeletal:     Right lower leg: Edema present.     Left lower leg: Edema present.     Comments: Trace edema BLE. Needs assistance for transfer. W/c for mobility.   Skin:    General: Skin is warm and dry.  Neurological:     General: No focal deficit present.     Mental Status: She is alert. Mental status is at baseline.     Cranial Nerves: No cranial nerve deficit.     Motor: No weakness.     Coordination: Coordination normal.     Gait: Gait abnormal.  Comments: Oriented to person and place.   Psychiatric:        Mood and Affect: Mood normal.        Behavior: Behavior normal.     Labs reviewed: Recent Labs    09/19/17 0655 09/20/17 0330 09/21/17 0235 09/22/17 09/29/17  NA 142 143 143 142 143  K 3.9 4.3 4.4 4.9 4.5  CL 111 112* 112*  --   --   CO2 25 25 26   --   --   GLUCOSE 108* 129* 112*  --   --   BUN 23 23 22 21 19   CREATININE 0.98 0.81 0.80 0.76 0.96  CALCIUM 8.1* 8.1* 8.4* 8.6 9.0  MG 2.3 2.3 2.2  --   --    Recent Labs    06/02/17 09/29/17  AST 12*  12 15  ALT 8  8 12   ALKPHOS 65  65 82  BILITOT 0.4 0.9  PROT  --  6.2  ALBUMIN 3.9 3.4   Recent Labs    09/15/17 1907 09/16/17 0407  09/19/17 0655  09/20/17 0330 09/21/17 0235 09/22/17 09/29/17  WBC 16.6* 11.2*   < > 9.9  --  9.1 10.9* 10.4 9.1  NEUTROABS 14.0* 8.7*  --   --   --   --   --   --   --   HGB 12.7 11.4*   < > 6.9*   < > 8.4* 8.9* 9.5 10.4  HCT 40.3 37.2   < > 22.9*   < > 26.7* 28.5* 28.3 31  MCV 93.3 94.7   < > 98.3  --  96.4 96.3 91.3 93.1  PLT 252 251   < > 192  --  210 263  --   --    < > = values in this interval not displayed.   Lab Results  Component Value Date   TSH 1.67 09/29/2017   Lab Results  Component Value Date   HGBA1C 6.0 (H) 09/16/2017   Lab Results  Component Value Date   CHOL 211 (A) 02/14/2014   HDL 47  02/14/2014   LDLCALC 133 02/14/2014   TRIG 155 02/14/2014    Significant Diagnostic Results in last 30 days:  No results found.  Assessment/Plan COPD (chronic obstructive pulmonary disease) (HCC) Stable, continue Advair bid.   GERD (gastroesophageal reflux disease) Stable, continue Famotidine 20mg  qd.   Hypothyroid Stable, last TSH wnl 1.67 09/29/17, continue Levothyroxine 69mcg qd po  Osteoarthritis Stable, continue Tylenol 1000mg  tid.   Depression, recurrent (HCC) Stable, continue Mirtazapine 7.5mg  qd, Escitalopram 20mg  qd.   Edema Stable, chronic trace edema BLE, continue Furosemide 40mg  qd.   Constipation Stable, continue MiraLax daily.      Family/ staff Communication: plan of care reviewed with the patient and charge nurse.   Labs/tests ordered:  none  Time spend 25 minutes.

## 2018-02-17 NOTE — Assessment & Plan Note (Signed)
Stable, continue Tylenol 1000mg tid.  

## 2018-02-17 NOTE — Assessment & Plan Note (Signed)
Stable, continue Advair bid 

## 2018-02-17 NOTE — Assessment & Plan Note (Signed)
Stable, last TSH wnl 1.67 09/29/17, continue Levothyroxine 32mcg qd po

## 2018-02-17 NOTE — Assessment & Plan Note (Signed)
Stable, continue Mirtazapine 7.5mg  qd, Escitalopram 20mg  qd.

## 2018-02-17 NOTE — Assessment & Plan Note (Signed)
Stable, continue MiraLax daily.  

## 2018-02-17 NOTE — Assessment & Plan Note (Signed)
Stable, continue Famotidine 20mg qd.  

## 2018-02-17 NOTE — Assessment & Plan Note (Signed)
Stable, chronic trace edema BLE, continue Furosemide 40mg  qd.

## 2018-02-19 ENCOUNTER — Encounter: Payer: Self-pay | Admitting: Nurse Practitioner

## 2018-03-17 ENCOUNTER — Non-Acute Institutional Stay (SKILLED_NURSING_FACILITY): Payer: Medicare Other | Admitting: Nurse Practitioner

## 2018-03-17 ENCOUNTER — Encounter: Payer: Self-pay | Admitting: Nurse Practitioner

## 2018-03-17 DIAGNOSIS — M159 Polyosteoarthritis, unspecified: Secondary | ICD-10-CM

## 2018-03-17 DIAGNOSIS — M150 Primary generalized (osteo)arthritis: Secondary | ICD-10-CM | POA: Diagnosis not present

## 2018-03-17 DIAGNOSIS — J42 Unspecified chronic bronchitis: Secondary | ICD-10-CM | POA: Diagnosis not present

## 2018-03-17 DIAGNOSIS — F339 Major depressive disorder, recurrent, unspecified: Secondary | ICD-10-CM

## 2018-03-17 DIAGNOSIS — E039 Hypothyroidism, unspecified: Secondary | ICD-10-CM

## 2018-03-17 DIAGNOSIS — K5900 Constipation, unspecified: Secondary | ICD-10-CM | POA: Diagnosis not present

## 2018-03-17 DIAGNOSIS — D509 Iron deficiency anemia, unspecified: Secondary | ICD-10-CM | POA: Insufficient documentation

## 2018-03-17 DIAGNOSIS — K219 Gastro-esophageal reflux disease without esophagitis: Secondary | ICD-10-CM | POA: Diagnosis not present

## 2018-03-17 DIAGNOSIS — R609 Edema, unspecified: Secondary | ICD-10-CM

## 2018-03-17 NOTE — Assessment & Plan Note (Signed)
Her mood is stable, continue Mirtazapine 7.5mg qd, Lexapro 20mg qd.  

## 2018-03-17 NOTE — Assessment & Plan Note (Signed)
Continue Fe 325mg  qd, last Hgb 10.4 09/29/17. Update CBC

## 2018-03-17 NOTE — Assessment & Plan Note (Signed)
Stable, continue Levothyroxine 70mcg qd, last TSH 1.67 09/29/17.

## 2018-03-17 NOTE — Assessment & Plan Note (Signed)
Chronic trace edema BLE, continue Furosemide 18m qd. Update CMP/eGFR

## 2018-03-17 NOTE — Assessment & Plan Note (Signed)
Stable, continue Tylenol.  

## 2018-03-17 NOTE — Assessment & Plan Note (Signed)
Stable, continue Albuterol HFA q6h prn, Atrovent neb q6h prn,  Advair 250/50 bid

## 2018-03-17 NOTE — Assessment & Plan Note (Signed)
No constipation, continue MiraLax qd

## 2018-03-17 NOTE — Assessment & Plan Note (Signed)
Stable, continue Famotidine 20mg qd.  

## 2018-03-17 NOTE — Progress Notes (Signed)
Location:  [facility] Room Number: 25 Place of Service:  SNF (31) Provider: Marlana Latus NP  Torri Michalski X, NP  Patient Care Team: Stepanie Graver X, NP as PCP - General (Internal Medicine) Newton Pigg, MD as Consulting Physician (Obstetrics and Gynecology) Monna Fam, MD as Consulting Physician (Ophthalmology) Melida Quitter, MD as Consulting Physician (Otolaryngology) Myrlene Broker, MD as Consulting Physician (Urology) Melina Modena, [facility]  Extended Emergency Contact Information Primary Emergency Contact: Cumming,[name] Address: [place], [place] Phone: [phone] Work Phone: [phone] Mobile Phone: [phone] Relation: Son Secondary Emergency Contact: Teofilo [place] Phone: [phone] Work Phone: [phone] Relation: Friend  Code Status:  DNR Goals of care: Advanced Directive information Advanced Directives 03/17/2018  Does Patient Have a Medical Advance Directive? Yes  Type of Paramedic of [employer];Living will;Out of facility DNR (pink MOST or yellow form)  Does patient want to make changes to medical advance directive? No - Patient declined  Copy of [place] in Chart? Yes - validated most recent copy scanned in chart (See row information)  Pre-existing out of facility DNR order (yellow form or pink MOST form) Yellow form placed in chart (order not valid for inpatient use);Pink MOST form placed in chart (order not valid for inpatient use)     Chief Complaint  Patient presents with  . Medical Management of Chronic Issues    HPI:  Pt is a 85 y.o. female seen today for medical management of chronic diseases.    The patient resides in [facility] for safety and care assistance, weights # 132Ibs 11/27/17, #134Ibs 02/19/18. No constipation while on MiraLax 1d. Her mood is stable on Mirtazapine 7.25m qd, Lexapro 263m qd.  Chronic trace edema BLE,  on Furosemide 4048md. COPD stable on Albuterol HFA q6h prn, Atrovent neb q6h prn,  Advair 250/50 bid. Anemia, on Fe 325m63m, last Hgb 10.4 09/29/17. GERD stable on Famotidine 20mg86m Hypothyroidism, on Levothyroxine 25mcg35m last TSH 1.67 09/29/17.    Past Medical History:  Diagnosis Date  . Asthma   . Carpal tunnel syndrome 03/07/2009  . Disturbance of skin sensation 02/21/2009  . Diverticulosis of colon (without mention of hemorrhage) 02/16/2009  . Fracture of upper end of left humerus 11/18/14  . 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 Hypothyroid   . Osteoarthrosis, unspecified whether generalized or localized, unspecified site 02/16/2009  . Other abnormal blood chemistry 06/19/2010   hyperglycemia  . Other and unspecified hyperlipidemia 10/24/2009  . Other atopic dermatitis and related conditions 02/16/2009  . Otosclerosis, unspecified 02/22/2003  . Pain in limb 12/05/2009  . Senile osteoporosis 06/16/2010  . Sensorineural hearing loss, unspecified 02/21/2009  . Syncope and collapse 05/30/2009  . Unspecified nasal polyp 02/21/2009  . Unspecified urinary incontinence 02/21/2009   Past Surgical History:  Procedure Laterality Date  . ABDOMINAL HYSTERECTOMY  1999   Dr. S. ForCollier BullockEAST BIOPSY  07/16/1990   benign left breast needle biopsy  Dr. StreckMargot ChimesRPAL TUNNEL RELEASE  04/07/2009   right Dr. SypherDaylene KatayamaTARACT EXTRACTION W/ INTRAOCULAR LENS  IMPLANT, BILATERAL Bilateral   . INTRAMEDULLARY (IM) NAIL INTERTROCHANTERIC Right 09/17/2017   Procedure: INTRAMEDULLARY (IM) NAIL INTERTROCHANTRIC HIP;  Surgeon: RogersNicholes Stairs [facility]: [facility] OR;[place]: Orthopedics;  Laterality: Right;  . OPEN REDUCTION INTERNAL FIXATION (ORIF)  DISTAL RADIAL FRACTURE Right 09/17/2017   Procedure: OPEN REDUCTION INTERNAL FIXATION (ORIF) DISTAL RADIAL FRACTURE;  Surgeon: Iran Planas, MD;  Location: [facility];  Service: Orthopedics;  Laterality: Right;  . TRIGGER FINGER RELEASE  Left 08/09/2014   Procedure: RELEASE TRIGGER FINGER/A-1 PULLEY LEFT MIDDLE FINGER;  Surgeon: Daryll Brod, MD;  Location: [facility];  Service: Orthopedics;  Laterality: Left;  REGIONAL/FAB    Allergies  Allergen Reactions  . Sulfa Antibiotics Itching and Rash  . Amoxicillin Other (See Comments)    Per [facility] 09/15/17 Has patient had a PCN reaction causing immediate rash, facial/tongue/throat swelling, SOB or lightheadedness with hypotension: Unknown Has patient had a PCN reaction causing severe rash involving mucus membranes or skin necrosis: Unknown Has patient had a PCN reaction that required hospitalization: Unknown Has patient had a PCN reaction occurring within the last 10 years: Unknown If all of the above answers are "NO", then may proceed with Cephalosporin use.  . Avelox [Moxifloxacin Hcl In Nacl] Itching  . Doxycycline Other (See Comments)    Unknown reaction  . Hista-Tabs [Triprolidine-Pse] Other (See Comments)    Passed out  . Pyrilamine-Phenylephrine Other (See Comments)    Unknown reaction - per [facility] 09/15/17  . Septra [Sulfamethoxazole-Trimethoprim] Other (See Comments)    Unknown reaction    Outpatient Encounter Medications as of 03/17/2018  Medication Sig  . acetaminophen (TYLENOL) 500 MG tablet Take 1,000 mg by mouth 3 (three) times daily.  Marland Kitchen albuterol (VENTOLIN HFA) 108 (90 Base) MCG/ACT inhaler Inhale 2 puffs into the lungs every 6 (six) hours as needed for shortness of breath (dyspnea).   Marland Kitchen aspirin EC 81 MG tablet Take 81 mg by mouth daily. On Tuesday and Saturday  . escitalopram (LEXAPRO) 10 MG tablet Take 20 mg by mouth daily. Take 2 tablets  to equal 20 mg.  . famotidine (PEPCID) 20 MG tablet Take 20 mg by mouth daily.  . feeding supplement (BOOST HIGH PROTEIN) LIQD Take 100-237 mLs by mouth 2 (two) times daily.   . ferrous sulfate 325 (65 FE) MG tablet Take 325 mg by mouth daily with breakfast.  . Fluticasone-Salmeterol (ADVAIR) 250-50 MCG/DOSE AEPB  Inhale 1 puff 2 (two) times daily into the lungs. Rinse mouth after use  . furosemide (LASIX) 40 MG tablet Take 40 mg by mouth daily. If systolic B/P is <932 hold  . ipratropium (ATROVENT) 0.02 % nebulizer solution Take 0.5 mg by nebulization every 6 (six) hours as needed for wheezing or shortness of breath.  . levothyroxine (SYNTHROID, LEVOTHROID) 25 MCG tablet Take 25 mcg by mouth daily before breakfast.  . mirtazapine (REMERON) 7.5 MG tablet Take 7.5 mg by mouth daily.  . OXYGEN Inhale 2 L/min into the lungs as needed (to keep O2 SATS >90%).   . polyethylene glycol (MIRALAX / GLYCOLAX) packet Take 17 g by mouth daily.  . raloxifene (EVISTA) 60 MG tablet Take 1 tablet by mouth  daily to treat osteoporosis  . zinc oxide 20 % ointment Apply 1 application topically 2 (two) times daily. Also apply every Shift - PRN  Apply to buttocks as needed every shift after incontinent episodes   No facility-administered encounter medications on file as of 03/17/2018.    ROS was provided with assistance of staff Review of Systems  Constitutional: Negative for activity change, appetite change, chills, diaphoresis, fatigue, fever and unexpected weight change.  HENT: Positive for hearing loss. Negative for congestion and voice change.   Eyes: Negative for visual disturbance.  Respiratory:  Positive for cough and shortness of breath. Negative for wheezing.        DOE, occasional dry cough.   Cardiovascular: Positive for leg swelling. Negative for chest pain and palpitations.  Gastrointestinal: Negative for abdominal distention, abdominal pain, constipation, diarrhea, nausea and vomiting.  Genitourinary: Negative for difficulty urinating, dysuria and urgency.  Musculoskeletal: Positive for arthralgias, back pain and gait problem.  Skin: Negative for color change and pallor.  Neurological: Negative for dizziness, speech difficulty, weakness and headaches.       Memory lapses.   Psychiatric/Behavioral: Negative  for agitation, behavioral problems, hallucinations and sleep disturbance. The patient is not nervous/anxious.     Immunization History  Administered Date(s) Administered  . Influenza Whole 12/27/2011, 11/05/2012  . Influenza-Unspecified 11/18/2013, 11/03/2014, 11/16/2015, 11/13/2016, 11/17/2017  . PPD Test 04/04/2008, 11/23/2014  . Pneumococcal Conjugate-13 09/30/2016  . Pneumococcal Polysaccharide-23 02/05/2008, 06/03/2008  . Td 02/05/2008, 06/03/2008   Pertinent  Health Maintenance Due  Topic Date Due  . INFLUENZA VACCINE  Completed  . DEXA SCAN  Completed  . PNA vac Low Risk Adult  Completed   Fall Risk  10/09/2017 09/23/2016 08/27/2016 05/30/2015 05/16/2015  Falls in the past year? Yes Yes Yes Yes No  Comment - - Emmi Telephone Survey: data to providers prior to load - -  Number falls in past yr: 2 or more '1 1 1 ' -  Comment - - Emmi Telephone Survey Actual Response = 1 - -  Injury with Fall? Yes No No Yes -  Comment broke R wrist - - - -  Risk Factor Category  - - - - -  Risk for fall due to : - - - History of fall(s);Impaired balance/gait;Impaired mobility -  Follow up - - - Falls evaluation completed;Education provided -   Functional Status Survey:    Vitals:   03/17/18 1337  BP: (!) 143/67  Pulse: 66  Resp: 16  Temp: (!) 97.4 F (36.3 C)  SpO2: 94%  Weight: 132 lb 1.6 oz (59.9 kg)  Height: '4\' 8"'  (1.422 m)   Body mass index is 29.62 kg/m. Physical Exam Vitals signs and nursing note reviewed.  Constitutional:      General: She is not in acute distress.    Appearance: Normal appearance. She is normal weight. She is not ill-appearing, toxic-appearing or diaphoretic.  HENT:     Head: Normocephalic and atraumatic.     Nose: Nose normal.     Mouth/Throat:     Mouth: Mucous membranes are moist.  Eyes:     Extraocular Movements: Extraocular movements intact.     Pupils: Pupils are equal, round, and reactive to light.  Neck:     Musculoskeletal: Normal range of motion  and neck supple.  Cardiovascular:     Rate and Rhythm: Normal rate and regular rhythm.     Heart sounds: No murmur.  Pulmonary:     Effort: Pulmonary effort is normal.     Breath sounds: No wheezing, rhonchi or rales.  Abdominal:     Palpations: Abdomen is soft.     Tenderness: There is no abdominal tenderness. There is no guarding or rebound.  Musculoskeletal:     Right lower leg: Edema present.     Left lower leg: Edema present.     Comments: Chronic trace edema BLE. W/c for mobility.   Skin:    General: Skin is warm and dry.  Neurological:     General: No focal deficit present.     Mental Status:  She is alert.     Cranial Nerves: No cranial nerve deficit.     Motor: No weakness.     Coordination: Coordination normal.     Gait: Gait abnormal.     Comments: Oriented to person and place.   Psychiatric:        Mood and Affect: Mood normal.        Behavior: Behavior normal.     Labs reviewed: Recent Labs    09/19/17 0655 09/20/17 0330 09/21/17 0235 09/22/17 09/29/17  NA 142 143 143 142 143  K 3.9 4.3 4.4 4.9 4.5  CL 111 112* 112*  --   --   CO2 '25 25 26  ' --   --   GLUCOSE 108* 129* 112*  --   --   BUN '23 23 22 21 19  ' CREATININE 0.98 0.81 0.80 0.76 0.96  CALCIUM 8.1* 8.1* 8.4* 8.6 9.0  MG 2.3 2.3 2.2  --   --    Recent Labs    06/02/17 09/29/17  AST 12*  12 15  ALT '8  8 12  ' ALKPHOS 65  65 82  BILITOT 0.4 0.9  PROT  --  6.2  ALBUMIN 3.9 3.4   Recent Labs    09/15/17 1907 09/16/17 0407  09/19/17 0655  09/20/17 0330 09/21/17 0235 09/22/17 09/29/17  WBC 16.6* 11.2*   < > 9.9  --  9.1 10.9* 10.4 9.1  NEUTROABS 14.0* 8.7*  --   --   --   --   --   --   --   HGB 12.7 11.4*   < > 6.9*   < > 8.4* 8.9* 9.5 10.4  HCT 40.3 37.2   < > 22.9*   < > 26.7* 28.5* 28.3 31  MCV 93.3 94.7   < > 98.3  --  96.4 96.3 91.3 93.1  PLT 252 251   < > 192  --  210 263  --   --    < > = values in this interval not displayed.   Lab Results  Component Value Date   TSH 1.67  09/29/2017   Lab Results  Component Value Date   HGBA1C 6.0 (H) 09/16/2017   Lab Results  Component Value Date   CHOL 211 (A) 02/14/2014   HDL 47 02/14/2014   LDLCALC 133 02/14/2014   TRIG 155 02/14/2014    Significant Diagnostic Results in last 30 days:  No results found.  Assessment/Plan COPD (chronic obstructive pulmonary disease) (HCC) Stable, continue Albuterol HFA q6h prn, Atrovent neb q6h prn,  Advair 250/50 bid  GERD (gastroesophageal reflux disease) Stable, continue Famotidine 61m qd.  Hypothyroid Stable, continue Levothyroxine 266m qd, last TSH 1.67 09/29/17.    Osteoarthritis Stable, continue Tylenol.   Constipation No constipation, continue MiraLax qd  Depression, recurrent ([place] mood is stable, continue  Mirtazapine 7.52m59md, Lexapro 66m41m.    Edema Chronic trace edema BLE, continue Furosemide 40mg70m Update CMP/eGFR  Iron deficiency anemia  Continue Fe 3252mg 91mlast Hgb 10.4 09/29/17. Update CBC     Family/ staff Communication: plan of care reviewed with the patient and charge nurse.   Labs/tests ordered:  CBC CMP/eGFR  Time spend 25 minutes.

## 2018-03-19 DIAGNOSIS — D509 Iron deficiency anemia, unspecified: Secondary | ICD-10-CM | POA: Diagnosis not present

## 2018-03-19 DIAGNOSIS — R609 Edema, unspecified: Secondary | ICD-10-CM | POA: Diagnosis not present

## 2018-03-19 LAB — BASIC METABOLIC PANEL
BUN: 27 — AB (ref 4–21)
Creatinine: 1.1 (ref 0.5–1.1)
GLUCOSE: 85
POTASSIUM: 4.3 (ref 3.4–5.3)
SODIUM: 142 (ref 137–147)

## 2018-03-19 LAB — CBC AND DIFFERENTIAL
HCT: 38 (ref 36–46)
Hemoglobin: 12.3 (ref 12.0–16.0)
Platelets: 245 (ref 150–399)
WBC: 6

## 2018-03-19 LAB — HEPATIC FUNCTION PANEL
ALT: 11 (ref 7–35)
AST: 14 (ref 13–35)

## 2018-04-10 ENCOUNTER — Non-Acute Institutional Stay (SKILLED_NURSING_FACILITY): Payer: Medicare Other | Admitting: Internal Medicine

## 2018-04-10 ENCOUNTER — Encounter: Payer: Self-pay | Admitting: Internal Medicine

## 2018-04-10 DIAGNOSIS — R609 Edema, unspecified: Secondary | ICD-10-CM

## 2018-04-10 DIAGNOSIS — J310 Chronic rhinitis: Secondary | ICD-10-CM

## 2018-04-10 DIAGNOSIS — D509 Iron deficiency anemia, unspecified: Secondary | ICD-10-CM | POA: Diagnosis not present

## 2018-04-10 DIAGNOSIS — M810 Age-related osteoporosis without current pathological fracture: Secondary | ICD-10-CM | POA: Diagnosis not present

## 2018-04-10 DIAGNOSIS — F339 Major depressive disorder, recurrent, unspecified: Secondary | ICD-10-CM

## 2018-04-10 DIAGNOSIS — R001 Bradycardia, unspecified: Secondary | ICD-10-CM | POA: Diagnosis not present

## 2018-04-10 DIAGNOSIS — F329 Major depressive disorder, single episode, unspecified: Secondary | ICD-10-CM | POA: Diagnosis not present

## 2018-04-10 DIAGNOSIS — K219 Gastro-esophageal reflux disease without esophagitis: Secondary | ICD-10-CM | POA: Diagnosis not present

## 2018-04-10 DIAGNOSIS — E039 Hypothyroidism, unspecified: Secondary | ICD-10-CM

## 2018-04-10 NOTE — Progress Notes (Signed)
Location:  [place] Room Number: 25 Place of Service:  SNF (31) Provider: Veleta Miners L,MD  Mast, Man X, NP  Patient Care Team: Mast, Man X, NP as PCP - General (Internal Medicine) Newton Pigg, MD as Consulting Physician (Obstetrics and Gynecology) Monna Fam, MD as Consulting Physician (Ophthalmology) Melida Quitter, MD as Consulting Physician (Otolaryngology) Myrlene Broker, MD as Consulting Physician (Urology) [place], [facility] Virgie Dad, MD as Consulting Physician (Internal Medicine)  Extended Emergency Contact Information Primary Emergency Contact: Ballon,[name] Address: [place], [place] Phone: [phone] Work Phone: [phone] Mobile Phone: [phone] Relation: Son Secondary Emergency Contact: Teofilo [place] of [place] Phone: [phone] Work Phone: [phone] Relation: Friend  Code Status: DNR Goals of care: Advanced Directive information Advanced Directives 04/10/2018  Does Patient Have a Medical Advance Directive? Yes  Type of Paramedic of [place];Out of facility DNR (pink MOST or yellow form);Living will  Does patient want to make changes to medical advance directive? No - Patient declined  Copy of [place] in Chart? Yes - validated most recent copy scanned in chart (See row information)  Pre-existing out of facility DNR order (yellow form or pink MOST form) Yellow form placed in chart (order not valid for inpatient use);Pink MOST form placed in chart (order not valid for inpatient use)     Chief Complaint  Patient presents with  . Acute Visit    pharmacy recommendation and Follow up on her Chronic Disease    HPI:  Pt is a 85 y.o. female seen today for medical management of chronic diseases.   Patient has h/o Sinus Bradycardia, LE edema, Hypothyroidism, Depression, Chronic Sinusitis, H/o  Hip Fracture, Patient main complain today was of Chronic Rhinorrhea. It seems to be her Chronic complain. She had CT scan done in 08/19 had shown Pansinusitis.Chronic. She says she has Thick Mucus Draining. No Cough or Fever or tenderness. Patient is mostly Wheelchair bound Her Weight is stable. She has been sleeping good at night.Mood seems to be stable. No New Nursing issues   Past Medical History:  Diagnosis Date  . Asthma   . Carpal tunnel syndrome 03/07/2009  . Disturbance of skin sensation 02/21/2009  . Diverticulosis of colon (without mention of hemorrhage) 02/16/2009  . Fracture of upper end of left humerus 11/18/14  . 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 Hypothyroid   . Osteoarthrosis, unspecified whether generalized or localized, unspecified site 02/16/2009  . Other abnormal blood chemistry 06/19/2010   hyperglycemia  . Other and unspecified hyperlipidemia 10/24/2009  . Other atopic dermatitis and related conditions 02/16/2009  . Otosclerosis, unspecified 02/22/2003  . Pain in limb 12/05/2009  . Senile osteoporosis 06/16/2010  . Sensorineural hearing loss, unspecified 02/21/2009  . Syncope and collapse 05/30/2009  . Unspecified nasal polyp 02/21/2009  . Unspecified urinary incontinence 02/21/2009   Past Surgical History:  Procedure Laterality Date  . ABDOMINAL HYSTERECTOMY  1999   Dr. Collier Bullock  . BREAST BIOPSY  07/16/1990   benign left breast needle biopsy  Dr. Margot Chimes  . CARPAL TUNNEL RELEASE  04/07/2009   right Dr. Daylene Katayama  . CATARACT EXTRACTION W/ INTRAOCULAR LENS  IMPLANT, BILATERAL Bilateral   . INTRAMEDULLARY (IM) NAIL INTERTROCHANTERIC Right 09/17/2017   Procedure: INTRAMEDULLARY (IM) NAIL INTERTROCHANTRIC HIP;  Surgeon: Nicholes Stairs, MD;  Location: [facility];  Service: Orthopedics;  Laterality:  Right;  Marland Kitchen OPEN REDUCTION INTERNAL FIXATION (ORIF) DISTAL RADIAL FRACTURE Right 09/17/2017   Procedure: OPEN REDUCTION INTERNAL FIXATION (ORIF) DISTAL RADIAL FRACTURE;  Surgeon:  Iran Planas, MD;  Location: [facility];  Service: Orthopedics;  Laterality: Right;  . TRIGGER FINGER RELEASE Left 08/09/2014   Procedure: RELEASE TRIGGER FINGER/A-1 PULLEY LEFT MIDDLE FINGER;  Surgeon: Daryll Brod, MD;  Location: [facility];  Service: Orthopedics;  Laterality: Left;  REGIONAL/FAB    Allergies  Allergen Reactions  . Sulfa Antibiotics Itching and Rash  . Amoxicillin Other (See Comments)    Per [facility] 09/15/17 Has patient had a PCN reaction causing immediate rash, facial/tongue/throat swelling, SOB or lightheadedness with hypotension: Unknown Has patient had a PCN reaction causing severe rash involving mucus membranes or skin necrosis: Unknown Has patient had a PCN reaction that required hospitalization: Unknown Has patient had a PCN reaction occurring within the last 10 years: Unknown If all of the above answers are "NO", then may proceed with Cephalosporin use.  . Avelox [Moxifloxacin Hcl In Nacl] Itching  . Doxycycline Other (See Comments)    Unknown reaction  . Hista-Tabs [Triprolidine-Pse] Other (See Comments)    Passed out  . Pyrilamine-Phenylephrine Other (See Comments)    Unknown reaction - per [facility] 09/15/17  . Septra [Sulfamethoxazole-Trimethoprim] Other (See Comments)    Unknown reaction    Outpatient Encounter Medications as of 04/10/2018  Medication Sig  . acetaminophen (TYLENOL) 500 MG tablet Take 1,000 mg by mouth 3 (three) times daily.  Marland Kitchen albuterol (VENTOLIN HFA) 108 (90 Base) MCG/ACT inhaler Inhale 2 puffs into the lungs every 6 (six) hours as needed for shortness of breath (dyspnea).   Marland Kitchen aspirin EC 81 MG tablet Take 81 mg by mouth daily. On Tuesday and Saturday  . escitalopram (LEXAPRO) 10 MG tablet Take 20 mg by mouth daily. Take 2 tablets  to equal 20 mg.  . famotidine (PEPCID) 20 MG tablet Take 20 mg by mouth daily.  . feeding supplement (BOOST HIGH PROTEIN) LIQD Take 100-237 mLs by mouth 2 (two) times daily.   . ferrous sulfate 325 (65 FE) MG  tablet Take 325 mg by mouth daily with breakfast.  . Fluticasone-Salmeterol (ADVAIR) 250-50 MCG/DOSE AEPB Inhale 1 puff 2 (two) times daily into the lungs. Rinse mouth after use  . furosemide (LASIX) 40 MG tablet Take 40 mg by mouth daily. If systolic B/P is <034 hold  . ipratropium (ATROVENT) 0.02 % nebulizer solution Take 0.5 mg by nebulization every 6 (six) hours as needed for wheezing or shortness of breath.  . levothyroxine (SYNTHROID, LEVOTHROID) 25 MCG tablet Take 25 mcg by mouth daily before breakfast.  . mirtazapine (REMERON) 7.5 MG tablet Take 7.5 mg by mouth daily.  . OXYGEN Inhale 2 L/min into the lungs as needed (to keep O2 SATS >90%).   . polyethylene glycol (MIRALAX / GLYCOLAX) packet Take 17 g by mouth daily.  . raloxifene (EVISTA) 60 MG tablet Take 1 tablet by mouth  daily to treat osteoporosis  . zinc oxide 20 % ointment Apply 1 application topically 2 (two) times daily. Also apply every Shift - PRN  Apply to buttocks as needed every shift after incontinent episodes   No facility-administered encounter medications on file as of 04/10/2018.     Review of Systems  Constitutional: Negative.   HENT: Positive for congestion and rhinorrhea.   Respiratory: Negative.   Cardiovascular: Negative.   Gastrointestinal: Negative.   Genitourinary: Negative.   Musculoskeletal: Negative.   Skin: Negative.  Neurological: Positive for weakness.  Psychiatric/Behavioral: Negative.     Immunization History  Administered Date(s) Administered  . Influenza Whole 12/27/2011, 11/05/2012  . Influenza-Unspecified 11/18/2013, 11/03/2014, 11/16/2015, 11/13/2016, 11/17/2017  . PPD Test 04/04/2008, 11/23/2014  . Pneumococcal Conjugate-13 09/30/2016  . Pneumococcal Polysaccharide-23 02/05/2008, 06/03/2008  . Td 02/05/2008, 06/03/2008   Pertinent  Health Maintenance Due  Topic Date Due  . INFLUENZA VACCINE  Completed  . DEXA SCAN  Completed  . PNA vac Low Risk Adult  Completed   Fall Risk   10/09/2017 09/23/2016 08/27/2016 05/30/2015 05/16/2015  Falls in the past year? Yes Yes Yes Yes No  Comment - - Emmi Telephone Survey: data to providers prior to load - -  Number falls in past yr: 2 or more 1 1 1  -  Comment - - Emmi Telephone Survey Actual Response = 1 - -  Injury with Fall? Yes No No Yes -  Comment broke R wrist - - - -  Risk Factor Category  - - - - -  Risk for fall due to : - - - History of fall(s);Impaired balance/gait;Impaired mobility -  Follow up - - - Falls evaluation completed;Education provided -   Functional Status Survey:    Vitals:   04/10/18 0927  BP: (!) 156/62  Pulse: (!) 46  Resp: 18  Temp: (!) 97.3 F (36.3 C)  SpO2: 92%  Weight: 133 lb 3.2 oz (60.4 kg)  Height: 4\' 8"  (1.422 m)   Body mass index is 29.86 kg/m. Physical Exam Vitals signs reviewed.  Constitutional:      Appearance: Normal appearance.  HENT:     Head: Normocephalic.     Nose: Rhinorrhea present.     Mouth/Throat:     Mouth: Mucous membranes are moist.     Pharynx: Oropharynx is clear.     Comments: No Sinus tenderness Eyes:     Pupils: Pupils are equal, round, and reactive to light.  Neck:     Musculoskeletal: Neck supple.  Cardiovascular:     Rate and Rhythm: Normal rate and regular rhythm.     Pulses: Normal pulses.     Heart sounds: Normal heart sounds.  Pulmonary:     Effort: Pulmonary effort is normal. No respiratory distress.     Breath sounds: Normal breath sounds. No wheezing or rales.  Abdominal:     General: Abdomen is flat. Bowel sounds are normal. There is no distension.     Palpations: Abdomen is soft.     Tenderness: There is no abdominal tenderness.  Musculoskeletal:        General: No swelling.  Skin:    General: Skin is warm and dry.  Neurological:     General: No focal deficit present.     Mental Status: She is alert.  Psychiatric:        Mood and Affect: Mood normal.        Thought Content: Thought content normal.     Labs reviewed: Recent  Labs    09/19/17 0655 09/20/17 0330 09/21/17 0235 09/22/17 09/29/17 03/19/18  NA 142 143 143 142 143 142  K 3.9 4.3 4.4 4.9 4.5 4.3  CL 111 112* 112*  --   --   --   CO2 25 25 26   --   --   --   GLUCOSE 108* 129* 112*  --   --   --   BUN 23 23 22 21 19  27*  CREATININE 0.98 0.81 0.80 0.76 0.96 1.1  CALCIUM 8.1* 8.1* 8.4* 8.6 9.0  --   MG 2.3 2.3 2.2  --   --   --    Recent Labs    06/02/17 09/29/17 03/19/18  AST 12*  12 15 14   ALT 8  8 12 11   ALKPHOS 65  65 82  --   BILITOT 0.4 0.9  --   PROT  --  6.2  --   ALBUMIN 3.9 3.4  --    Recent Labs    09/15/17 1907 09/16/17 0407  09/20/17 0330 09/21/17 0235 09/22/17 09/29/17 03/19/18  WBC 16.6* 11.2*   < > 9.1 10.9* 10.4 9.1 6.0  NEUTROABS 14.0* 8.7*  --   --   --   --   --   --   HGB 12.7 11.4*   < > 8.4* 8.9* 9.5 10.4 12.3  HCT 40.3 37.2   < > 26.7* 28.5* 28.3 31 38  MCV 93.3 94.7   < > 96.4 96.3 91.3 93.1  --   PLT 252 251   < > 210 263  --   --  245   < > = values in this interval not displayed.   Lab Results  Component Value Date   TSH 1.67 09/29/2017   Lab Results  Component Value Date   HGBA1C 6.0 (H) 09/16/2017   Lab Results  Component Value Date   CHOL 211 (A) 02/14/2014   HDL 47 02/14/2014   LDLCALC 133 02/14/2014   TRIG 155 02/14/2014    Significant Diagnostic Results in last 30 days:  No results found.  Assessment/Plan Sinus bradycardia Patient has fee HR less then 60 in the system. Her Previous EKG has shown Sinus Loletha Grayer cardia With her frailty and no symptoms Will continue to monitor Not on any meds   Chronic rhinitis Had CT Scan in 2019 which showed Pan sinusitis Will start her on Flonase for 4 weeks And reval . ? Possible course of Antibiotics  GERD On Pepcid  Acquired hypothyroidism TSH was normal in 08/19 Repeat TSH  Age-related osteoporosis On Evista  Iron deficiency anemia, Hgb was normal in 02/20 Discontinue Iron  Major depression Patient stable on Remeron and  Celexa Would not do Any GDR at this time  LE Edema,  Stable on Lasix     Family/ staff Communication:   Labs/tests ordered:

## 2018-04-15 DIAGNOSIS — M25561 Pain in right knee: Secondary | ICD-10-CM | POA: Diagnosis not present

## 2018-05-08 ENCOUNTER — Non-Acute Institutional Stay (SKILLED_NURSING_FACILITY): Payer: Medicare Other | Admitting: Family

## 2018-05-08 ENCOUNTER — Encounter: Payer: Self-pay | Admitting: Family

## 2018-05-08 DIAGNOSIS — G8929 Other chronic pain: Secondary | ICD-10-CM | POA: Diagnosis not present

## 2018-05-08 DIAGNOSIS — D509 Iron deficiency anemia, unspecified: Secondary | ICD-10-CM

## 2018-05-08 DIAGNOSIS — F329 Major depressive disorder, single episode, unspecified: Secondary | ICD-10-CM | POA: Diagnosis not present

## 2018-05-08 DIAGNOSIS — I739 Peripheral vascular disease, unspecified: Secondary | ICD-10-CM

## 2018-05-08 DIAGNOSIS — M25561 Pain in right knee: Secondary | ICD-10-CM

## 2018-05-08 DIAGNOSIS — E039 Hypothyroidism, unspecified: Secondary | ICD-10-CM

## 2018-05-08 NOTE — Progress Notes (Signed)
Location:  [facility] Room Number: 25 Place of Service:  SNF (31) Provider: Marlowe Sax NP  Mast, Man X, NP  Patient Care Team: Mast, Man X, NP as PCP - General (Internal Medicine) Newton Pigg, MD as Consulting Physician (Obstetrics and Gynecology) Monna Fam, MD as Consulting Physician (Ophthalmology) Melida Quitter, MD as Consulting Physician (Otolaryngology) Myrlene Broker, MD as Consulting Physician (Urology) [place], [facility] Virgie Dad, MD as Consulting Physician (Internal Medicine)  Extended Emergency Contact Information Primary Emergency Contact: Rawling,[name] Address: [place], [place] Phone: [phone] Work Phone: [phone] Mobile Phone: [phone] Relation: Son Secondary Emergency Contact: Teofilo Pod [place] of [place] Phone: [phone] Work Phone: [phone] Relation: Friend  Code Status:  DNR Goals of care: Advanced Directive information Advanced Directives 05/08/2018  Does Patient Have a Medical Advance Directive? Yes  Type of Paramedic of [place];Living will;Out of facility DNR (pink MOST or yellow form)  Does patient want to make changes to medical advance directive? No - Patient declined  Copy of [place] in Chart? Yes - validated most recent copy scanned in chart (See row information)  Pre-existing out of facility DNR order (yellow form or pink MOST form) Yellow form placed in chart (order not valid for inpatient use);Pink MOST form placed in chart (order not valid for inpatient use)     Chief Complaint  Patient presents with  . Medical Management of Chronic Issues    HPI:  Pt is a 85 y.o. female seen today for medical management of chronic diseases.she has a medical history of COPD,PVD,sinus bradycardia,Hypothyroidism,GERD,depression among other conditions.she is seen in her room today  sitting up on wheelchair watching TV.she denies any acute issues during visit.she states previous runny nose when last seen by MD  has resolved.Facility Nurse reports no new concerns.she has had no recent fall episodes or acute illness. Her weight log reviewed shows 4 lbs weight gain over the past one month.she states appetite is good.She denies any cough, shortness of breath or worsening leg edema. She was seen by Orthopedic Dr.John Stann Mainland 04/15/2018 had a right knee steroid injection for knee pain.she state injection helped with the pain.pain under control.      Past Medical History:  Diagnosis Date  . Asthma   . Carpal tunnel syndrome 03/07/2009  . Disturbance of skin sensation 02/21/2009  . Diverticulosis of colon (without mention of hemorrhage) 02/16/2009  . Fracture of upper end of left humerus 11/18/14  . 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 Hypothyroid   . Osteoarthrosis, unspecified whether generalized or localized, unspecified site 02/16/2009  . Other abnormal blood chemistry 06/19/2010   hyperglycemia  . Other and unspecified hyperlipidemia 10/24/2009  . Other atopic dermatitis and related conditions 02/16/2009  . Otosclerosis, unspecified 02/22/2003  . Pain in limb 12/05/2009  . Senile osteoporosis 06/16/2010  . Sensorineural hearing loss, unspecified 02/21/2009  . Syncope and collapse 05/30/2009  . Unspecified nasal polyp 02/21/2009  . Unspecified urinary incontinence 02/21/2009   Past Surgical History:  Procedure Laterality Date  . ABDOMINAL HYSTERECTOMY  1999   Dr. Collier Bullock  . BREAST BIOPSY  07/16/1990   benign left breast needle biopsy  Dr. Margot Chimes  . CARPAL TUNNEL RELEASE  04/07/2009   right Dr. Daylene Katayama  . CATARACT EXTRACTION W/ INTRAOCULAR LENS  IMPLANT, BILATERAL Bilateral   . INTRAMEDULLARY (IM) NAIL INTERTROCHANTERIC Right  09/17/2017   Procedure: INTRAMEDULLARY (IM) NAIL INTERTROCHANTRIC HIP;  Surgeon: Nicholes Stairs, MD;  Location: [place];  Service: Orthopedics;  Laterality:  Right;  . OPEN REDUCTION INTERNAL FIXATION (ORIF) DISTAL RADIAL FRACTURE Right 09/17/2017   Procedure: OPEN REDUCTION INTERNAL FIXATION (ORIF) DISTAL RADIAL FRACTURE;  Surgeon: Iran Planas, MD;  Location: [place];  Service: Orthopedics;  Laterality: Right;  . TRIGGER FINGER RELEASE Left 08/09/2014   Procedure: RELEASE TRIGGER FINGER/A-1 PULLEY LEFT MIDDLE FINGER;  Surgeon: Daryll Brod, MD;  Location: [place];  Service: Orthopedics;  Laterality: Left;  REGIONAL/FAB    Allergies  Allergen Reactions  . Sulfa Antibiotics Itching and Rash  . Amoxicillin Other (See Comments)    Per [facility] 09/15/17 Has patient had a PCN reaction causing immediate rash, facial/tongue/throat swelling, SOB or lightheadedness with hypotension: Unknown Has patient had a PCN reaction causing severe rash involving mucus membranes or skin necrosis: Unknown Has patient had a PCN reaction that required hospitalization: Unknown Has patient had a PCN reaction occurring within the last 10 years: Unknown If all of the above answers are "NO", then may proceed with Cephalosporin use.  . Avelox [Moxifloxacin Hcl In Nacl] Itching  . Doxycycline Other (See Comments)    Unknown reaction  . Hista-Tabs [Triprolidine-Pse] Other (See Comments)    Passed out  . Pyrilamine-Phenylephrine Other (See Comments)    Unknown reaction - per [facility] 09/15/17  . Septra [Sulfamethoxazole-Trimethoprim] Other (See Comments)    Unknown reaction    Outpatient Encounter Medications as of 05/08/2018  Medication Sig  . acetaminophen (TYLENOL) 500 MG tablet Take 1,000 mg by mouth 3 (three) times daily.  Marland Kitchen albuterol (VENTOLIN HFA) 108 (90 Base) MCG/ACT inhaler Inhale 2 puffs into the lungs every 6 (six) hours as needed for shortness of breath (dyspnea).   Marland Kitchen aspirin EC 81 MG tablet Take 81 mg by mouth daily. On Tuesday and Saturday  . escitalopram (LEXAPRO) 10 MG tablet Take 20 mg by mouth daily. Take 2 tablets  to equal 20 mg.  . famotidine  (PEPCID) 20 MG tablet Take 20 mg by mouth daily.  . feeding supplement (BOOST HIGH PROTEIN) LIQD Take 100-237 mLs by mouth 2 (two) times daily.   . ferrous sulfate 325 (65 FE) MG tablet Take 325 mg by mouth daily with breakfast.  . Fluticasone-Salmeterol (ADVAIR) 250-50 MCG/DOSE AEPB Inhale 1 puff 2 (two) times daily into the lungs. Rinse mouth after use  . furosemide (LASIX) 40 MG tablet Take 40 mg by mouth daily. If systolic B/P is <765 hold  . ipratropium (ATROVENT) 0.02 % nebulizer solution Take 0.5 mg by nebulization every 6 (six) hours as needed for wheezing or shortness of breath.  . levothyroxine (SYNTHROID, LEVOTHROID) 25 MCG tablet Take 25 mcg by mouth daily before breakfast.  . mirtazapine (REMERON) 7.5 MG tablet Take 7.5 mg by mouth daily.  . OXYGEN Inhale 2 L/min into the lungs as needed (to keep O2 SATS >90%).   . polyethylene glycol (MIRALAX / GLYCOLAX) packet Take 17 g by mouth daily.  . raloxifene (EVISTA) 60 MG tablet Take 1 tablet by mouth  daily to treat osteoporosis  . zinc oxide 20 % ointment Apply 1 application topically 2 (two) times daily. Also apply every Shift - PRN  Apply to buttocks as needed every shift after incontinent episodes   No facility-administered encounter medications on file as of 05/08/2018.     Review of Systems  Constitutional: Negative for activity change, appetite change, chills, fatigue and  fever.  HENT: Positive for hearing loss. Negative for congestion, postnasal drip, rhinorrhea, sinus pressure, sinus pain, sneezing, sore throat and trouble swallowing.   Eyes: Positive for visual disturbance. Negative for pain, discharge, redness and itching.  Respiratory: Negative for cough, chest tightness, shortness of breath and wheezing.   Cardiovascular: Negative for chest pain, palpitations and leg swelling.  Gastrointestinal: Negative for abdominal distention, abdominal pain, constipation, diarrhea, nausea and vomiting.  Endocrine: Negative for cold  intolerance, heat intolerance, polydipsia, polyphagia and polyuria.  Genitourinary: Negative for difficulty urinating, dysuria, flank pain and urgency.  Musculoskeletal: Positive for arthralgias and gait problem.       Right hip pain under control   Skin: Negative for color change, pallor, rash and wound.  Neurological: Negative for dizziness, light-headedness and headaches.  Hematological: Does not bruise/bleed easily.  Psychiatric/Behavioral: Negative for agitation, confusion and sleep disturbance. The patient is not nervous/anxious.     Immunization History  Administered Date(s) Administered  . Influenza Whole 12/27/2011, 11/05/2012  . Influenza-Unspecified 11/18/2013, 11/03/2014, 11/16/2015, 11/13/2016, 11/17/2017  . PPD Test 04/04/2008, 11/23/2014  . Pneumococcal Conjugate-13 09/30/2016  . Pneumococcal Polysaccharide-23 02/05/2008, 06/03/2008  . Td 02/05/2008, 06/03/2008   Pertinent  Health Maintenance Due  Topic Date Due  . INFLUENZA VACCINE  09/05/2018  . DEXA SCAN  Completed  . PNA vac Low Risk Adult  Completed   Fall Risk  10/09/2017 09/23/2016 08/27/2016 05/30/2015 05/16/2015  Falls in the past year? Yes Yes Yes Yes No  Comment - - Emmi Telephone Survey: data to providers prior to load - -  Number falls in past yr: 2 or more 1 1 1  -  Comment - - Emmi Telephone Survey Actual Response = 1 - -  Injury with Fall? Yes No No Yes -  Comment broke R wrist - - - -  Risk Factor Category  - - - - -  Risk for fall due to : - - - History of fall(s);Impaired balance/gait;Impaired mobility -  Follow up - - - Falls evaluation completed;Education provided -    Vitals:   05/08/18 1011  BP: (!) 150/61  Pulse: (!) 53  Resp: 18  Temp: 97.8 F (36.6 C)  SpO2: 91%  Weight: 137 lb 3.2 oz (62.2 kg)  Height: 4\' 7"  (1.397 m)   Body mass index is 31.89 kg/m. Physical Exam Constitutional:      General: She is not in acute distress.    Appearance: She is obese. She is not ill-appearing.   HENT:     Head: Normocephalic.     Right Ear: Tympanic membrane, ear canal and external ear normal. There is no impacted cerumen.     Left Ear: Tympanic membrane, ear canal and external ear normal. There is no impacted cerumen.     Nose: Nose normal. No congestion or rhinorrhea.     Mouth/Throat:     Mouth: Mucous membranes are moist.     Pharynx: Oropharynx is clear. No oropharyngeal exudate or posterior oropharyngeal erythema.  Eyes:     General: No scleral icterus.       Right eye: No discharge.        Left eye: No discharge.     Conjunctiva/sclera: Conjunctivae normal.     Pupils: Pupils are equal, round, and reactive to light.     Comments: Eye glasses in place   Neck:     Musculoskeletal: Normal range of motion. No neck rigidity or muscular tenderness.     Vascular: No carotid bruit.  Cardiovascular:     Rate and Rhythm: Normal rate and regular rhythm.     Pulses: Normal pulses.     Heart sounds: Normal heart sounds. No murmur. No friction rub. No gallop.   Pulmonary:     Effort: Pulmonary effort is normal. No respiratory distress.     Breath sounds: Normal breath sounds. No wheezing, rhonchi or rales.  Chest:     Chest wall: No tenderness.  Abdominal:     General: Bowel sounds are normal. There is no distension.     Palpations: Abdomen is soft. There is no mass.     Tenderness: There is no abdominal tenderness. There is no right CVA tenderness, left CVA tenderness, guarding or rebound.  Musculoskeletal: Normal range of motion.        General: No swelling or tenderness.     Comments: Unsteady gait uses wheelchair.Bilateral lower extremities trace edema.knee high ted hose in place   Lymphadenopathy:     Cervical: No cervical adenopathy.  Skin:    General: Skin is warm and dry.     Capillary Refill: Capillary refill takes 2 to 3 seconds.     Coloration: Skin is not pale.     Findings: No erythema or rash.  Neurological:     Mental Status: She is alert. Mental status is  at baseline.     Cranial Nerves: No cranial nerve deficit.     Sensory: No sensory deficit.     Motor: No weakness.     Coordination: Coordination normal.     Gait: Gait abnormal.  Psychiatric:        Mood and Affect: Mood normal.        Behavior: Behavior normal.        Thought Content: Thought content normal.        Judgment: Judgment normal.     Labs reviewed: Recent Labs    09/19/17 0655 09/20/17 0330 09/21/17 0235 09/22/17 09/29/17 03/19/18  NA 142 143 143 142 143 142  K 3.9 4.3 4.4 4.9 4.5 4.3  CL 111 112* 112*  --   --   --   CO2 25 25 26   --   --   --   GLUCOSE 108* 129* 112*  --   --   --   BUN 23 23 22 21 19  27*  CREATININE 0.98 0.81 0.80 0.76 0.96 1.1  CALCIUM 8.1* 8.1* 8.4* 8.6 9.0  --   MG 2.3 2.3 2.2  --   --   --    Recent Labs    06/02/17 09/29/17 03/19/18  AST 12*  12 15 14   ALT 8  8 12 11   ALKPHOS 65  65 82  --   BILITOT 0.4 0.9  --   PROT  --  6.2  --   ALBUMIN 3.9 3.4  --    Recent Labs    09/15/17 1907 09/16/17 0407  09/20/17 0330 09/21/17 0235 09/22/17 09/29/17 03/19/18  WBC 16.6* 11.2*   < > 9.1 10.9* 10.4 9.1 6.0  NEUTROABS 14.0* 8.7*  --   --   --   --   --   --   HGB 12.7 11.4*   < > 8.4* 8.9* 9.5 10.4 12.3  HCT 40.3 37.2   < > 26.7* 28.5* 28.3 31 38  MCV 93.3 94.7   < > 96.4 96.3 91.3 93.1  --   PLT 252 251   < > 210 263  --   --  245   < > = values in this interval not displayed.   Lab Results  Component Value Date   TSH 1.67 09/29/2017   Lab Results  Component Value Date   HGBA1C 6.0 (H) 09/16/2017   Lab Results  Component Value Date   CHOL 211 (A) 02/14/2014   HDL 47 02/14/2014   LDLCALC 133 02/14/2014   TRIG 155 02/14/2014    Significant Diagnostic Results in last 30 days:  No results found.  Assessment/Plan 1. Acquired hypothyroidism Lab Results  Component Value Date   TSH 1.67 09/29/2017  Continue on levothyroxine 25 mcg tablet before breakfast. - TSH level 05/11/2018   2. Iron deficiency anemia,  unspecified iron deficiency anemia type Hgb 12.3 (03/19/2018).continue on ferrous sulfate 325 mg tablet daily along with current stool softener.   3. Major depression, chronic Mood stable.coping well COVID-19 facility room quarantine. continue on escitalopram 20 mg tablet daily. continue to monitor for mood changes.   4. PVD (peripheral vascular disease) (HCC) Trace lower extremities edema.No ulcers.continue on knee high ted hose.continue on Furosemide 40 mg tablet daily.  5. Chronic pain of right knee Status post steroid injection with Ortho 04/15/2018.pain under control.continue on  Tylenol 1000 mg tablet three times daily.   Family/ staff Communication: Patient and facility Nurse   Labs/tests ordered:  TSH level 05/11/2018

## 2018-05-11 DIAGNOSIS — E039 Hypothyroidism, unspecified: Secondary | ICD-10-CM | POA: Diagnosis not present

## 2018-05-11 LAB — TSH: TSH: 1.62 (ref <=5.90)

## 2018-06-05 ENCOUNTER — Encounter: Payer: Self-pay | Admitting: Nurse Practitioner

## 2018-06-05 NOTE — Progress Notes (Signed)
Location:  [facility] Room Number: 25 Place of Service:  SNF (31) Provider:  Marlana Latus  NP   Mast, Man X, NP  Patient Care Team: Mast, Man X, NP as PCP - General (Internal Medicine) Newton Pigg, MD as Consulting Physician (Obstetrics and Gynecology) Monna Fam, MD as Consulting Physician (Ophthalmology) Melida Quitter, MD as Consulting Physician (Otolaryngology) Myrlene Broker, MD as Consulting Physician (Urology) [place], [facility] Virgie Dad, MD as Consulting Physician (Internal Medicine)  Extended Emergency Contact Information Primary Emergency Contact: Vaux,[name] Address: [place], [place] Phone: [phone] Work Phone: [phone] Mobile Phone: [phone] Relation: Son Secondary Emergency Contact: Teofilo Pod [place] of [place] Phone: [phone] Work Phone: [phone] Relation: Friend  Code Status:  DNR Goals of care: Advanced Directive information Advanced Directives 06/05/2018  Does Patient Have a Medical Advance Directive? Yes  Type of Paramedic of [place];Living will;Out of facility DNR (pink MOST or yellow form)  Does patient want to make changes to medical advance directive? No - Patient declined  Copy of [place] in Chart? Yes - validated most recent copy scanned in chart (See row information)  Pre-existing out of facility DNR order (yellow form or pink MOST form) Yellow form placed in chart (order not valid for inpatient use);Pink MOST form placed in chart (order not valid for inpatient use)     Chief Complaint  Patient presents with  . Medical Management of Chronic Issues    HPI:  Pt is a 85 y.o. female seen today for medical management of chronic diseases.     Past Medical History:  Diagnosis Date  . Asthma   . Carpal tunnel syndrome 03/07/2009  . Disturbance of skin sensation  02/21/2009  . Diverticulosis of colon (without mention of hemorrhage) 02/16/2009  . Fracture of upper end of left humerus 11/18/14  . 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 Hypothyroid   . Osteoarthrosis, unspecified whether generalized or localized, unspecified site 02/16/2009  . Other abnormal blood chemistry 06/19/2010   hyperglycemia  . Other and unspecified hyperlipidemia 10/24/2009  . Other atopic dermatitis and related conditions 02/16/2009  . Otosclerosis, unspecified 02/22/2003  . Pain in limb 12/05/2009  . Senile osteoporosis 06/16/2010  . Sensorineural hearing loss, unspecified 02/21/2009  . Syncope and collapse 05/30/2009  . Unspecified nasal polyp 02/21/2009  . Unspecified urinary incontinence 02/21/2009   Past Surgical History:  Procedure Laterality Date  . ABDOMINAL HYSTERECTOMY  1999   Dr. Collier Bullock  . BREAST BIOPSY  07/16/1990   benign left breast needle biopsy  Dr. Margot Chimes  . CARPAL TUNNEL RELEASE  04/07/2009   right Dr. Daylene Katayama  . CATARACT EXTRACTION W/ INTRAOCULAR LENS  IMPLANT, BILATERAL Bilateral   . INTRAMEDULLARY (IM) NAIL INTERTROCHANTERIC Right 09/17/2017   Procedure: INTRAMEDULLARY (IM) NAIL INTERTROCHANTRIC HIP;  Surgeon: Nicholes Stairs, MD;  Location: [place];  Service: Orthopedics;  Laterality: Right;  . OPEN REDUCTION INTERNAL FIXATION (ORIF) DISTAL RADIAL FRACTURE Right 09/17/2017   Procedure: OPEN REDUCTION INTERNAL FIXATION (ORIF) DISTAL RADIAL FRACTURE;  Surgeon: Iran Planas, MD;  Location: [place];  Service: Orthopedics;  Laterality: Right;  . TRIGGER FINGER RELEASE Left 08/09/2014   Procedure: RELEASE TRIGGER FINGER/A-1 PULLEY LEFT MIDDLE FINGER;  Surgeon: Daryll Brod, MD;  Location: [place];  Service: Orthopedics;  Laterality: Left;  REGIONAL/FAB    Allergies  Allergen  Reactions  . Sulfa Antibiotics Itching and Rash  . Amoxicillin Other (See Comments)    Per [facility] 09/15/17 Has patient had a PCN reaction causing immediate rash,  facial/tongue/throat swelling, SOB or lightheadedness with hypotension: Unknown Has patient had a PCN reaction causing severe rash involving mucus membranes or skin necrosis: Unknown Has patient had a PCN reaction that required hospitalization: Unknown Has patient had a PCN reaction occurring within the last 10 years: Unknown If all of the above answers are "NO", then may proceed with Cephalosporin use.  . Avelox [Moxifloxacin Hcl In Nacl] Itching  . Doxycycline Other (See Comments)    Unknown reaction  . Hista-Tabs [Triprolidine-Pse] Other (See Comments)    Passed out  . Pyrilamine-Phenylephrine Other (See Comments)    Unknown reaction - per [facility] 09/15/17  . Septra [Sulfamethoxazole-Trimethoprim] Other (See Comments)    Unknown reaction    Outpatient Encounter Medications as of 06/05/2018  Medication Sig  . acetaminophen (TYLENOL) 500 MG tablet Take 1,000 mg by mouth 3 (three) times daily.  Marland Kitchen albuterol (VENTOLIN HFA) 108 (90 Base) MCG/ACT inhaler Inhale 2 puffs into the lungs every 6 (six) hours as needed for shortness of breath (dyspnea).   Marland Kitchen aspirin EC 81 MG tablet Take 81 mg by mouth daily. On Tuesday and Saturday  . escitalopram (LEXAPRO) 10 MG tablet Take 20 mg by mouth daily. Take 2 tablets  to equal 20 mg.  . famotidine (PEPCID) 20 MG tablet Take 20 mg by mouth daily.  . feeding supplement (BOOST HIGH PROTEIN) LIQD Take 100-237 mLs by mouth 2 (two) times daily.   . ferrous sulfate 325 (65 FE) MG tablet Take 325 mg by mouth daily with breakfast.  . Fluticasone-Salmeterol (ADVAIR) 250-50 MCG/DOSE AEPB Inhale 1 puff 2 (two) times daily into the lungs. Rinse mouth after use  . furosemide (LASIX) 40 MG tablet Take 40 mg by mouth daily. If systolic B/P is <245 hold  . ipratropium (ATROVENT) 0.02 % nebulizer solution Take 0.5 mg by nebulization every 6 (six) hours as needed for wheezing or shortness of breath.  . levothyroxine (SYNTHROID, LEVOTHROID) 25 MCG tablet Take 25 mcg by mouth daily  before breakfast.  . mirtazapine (REMERON) 7.5 MG tablet Take 7.5 mg by mouth daily.  . OXYGEN Inhale 2 L/min into the lungs as needed (to keep O2 SATS >90%).   . polyethylene glycol (MIRALAX / GLYCOLAX) packet Take 17 g by mouth daily.  . raloxifene (EVISTA) 60 MG tablet Take 1 tablet by mouth  daily to treat osteoporosis  . zinc oxide 20 % ointment Apply 1 application topically 2 (two) times daily. Also apply every Shift - PRN  Apply to buttocks as needed every shift after incontinent episodes   No facility-administered encounter medications on file as of 06/05/2018.     Review of Systems  Immunization History  Administered Date(s) Administered  . Influenza Whole 12/27/2011, 11/05/2012  . Influenza-Unspecified 11/18/2013, 11/03/2014, 11/16/2015, 11/13/2016, 11/17/2017  . PPD Test 04/04/2008, 11/23/2014  . Pneumococcal Conjugate-13 09/30/2016  . Pneumococcal Polysaccharide-23 02/05/2008, 06/03/2008  . Td 02/05/2008, 06/03/2008   Pertinent  Health Maintenance Due  Topic Date Due  . INFLUENZA VACCINE  09/05/2018  . DEXA SCAN  Completed  . PNA vac Low Risk Adult  Completed   Fall Risk  10/09/2017 09/23/2016 08/27/2016 05/30/2015 05/16/2015  Falls in the past year? Yes Yes Yes Yes No  Comment - - [facility]i Telephone Survey: data to providers prior to load - -  Number falls in past  yr: 2 or more 1 1 1  -  Comment - - Emmi Telephone Survey Actual Response = 1 - -  Injury with Fall? Yes No No Yes -  Comment broke R wrist - - - -  Risk Factor Category  - - - - -  Risk for fall due to : - - - History of fall(s);Impaired balance/gait;Impaired mobility -  Follow up - - - Falls evaluation completed;Education provided -   Functional Status Survey:    Vitals:   06/05/18 1001  BP: (!) 149/69  Pulse: (!) 54  Resp: 16  Temp: (!) 97 F (36.1 C)  SpO2: 93%  Weight: 137 lb 3.2 oz (62.2 kg)  Height: 4\' 8"  (1.422 m)   Body mass index is 30.76 kg/m. Physical Exam  Labs reviewed: Recent Labs     09/19/17 0655 09/20/17 0330 09/21/17 0235 09/22/17 09/29/17 03/19/18  NA 142 143 143 142 143 142  K 3.9 4.3 4.4 4.9 4.5 4.3  CL 111 112* 112*  --   --   --   CO2 25 25 26   --   --   --   GLUCOSE 108* 129* 112*  --   --   --   BUN 23 23 22 21 19  27*  CREATININE 0.98 0.81 0.80 0.76 0.96 1.1  CALCIUM 8.1* 8.1* 8.4* 8.6 9.0  --   MG 2.3 2.3 2.2  --   --   --    Recent Labs    09/29/17 03/19/18  AST 15 14  ALT 12 11  ALKPHOS 82  --   BILITOT 0.9  --   PROT 6.2  --   ALBUMIN 3.4  --    Recent Labs    09/15/17 1907 09/16/17 0407  09/20/17 0330 09/21/17 0235 09/22/17 09/29/17 03/19/18  WBC 16.6* 11.2*   < > 9.1 10.9* 10.4 9.1 6.0  NEUTROABS 14.0* 8.7*  --   --   --   --   --   --   HGB 12.7 11.4*   < > 8.4* 8.9* 9.5 10.4 12.3  HCT 40.3 37.2   < > 26.7* 28.5* 28.3 31 38  MCV 93.3 94.7   < > 96.4 96.3 91.3 93.1  --   PLT 252 251   < > 210 263  --   --  245   < > = values in this interval not displayed.   Lab Results  Component Value Date   TSH 1.67 09/29/2017   Lab Results  Component Value Date   HGBA1C 6.0 (H) 09/16/2017   Lab Results  Component Value Date   CHOL 211 (A) 02/14/2014   HDL 47 02/14/2014   LDLCALC 133 02/14/2014   TRIG 155 02/14/2014    Significant Diagnostic Results in last 30 days:  No results found.  Assessment/Plan There are no diagnoses linked to this encounter.   Family/ staff Communication:   Labs/tests ordered:     This encounter was created in error - please disregard.

## 2018-06-11 ENCOUNTER — Non-Acute Institutional Stay (SKILLED_NURSING_FACILITY): Payer: Medicare Other | Admitting: Family

## 2018-06-11 ENCOUNTER — Encounter: Payer: Self-pay | Admitting: Family

## 2018-06-11 DIAGNOSIS — F329 Major depressive disorder, single episode, unspecified: Secondary | ICD-10-CM | POA: Diagnosis not present

## 2018-06-11 DIAGNOSIS — M159 Polyosteoarthritis, unspecified: Secondary | ICD-10-CM

## 2018-06-11 DIAGNOSIS — R7303 Prediabetes: Secondary | ICD-10-CM

## 2018-06-11 DIAGNOSIS — M150 Primary generalized (osteo)arthritis: Secondary | ICD-10-CM

## 2018-06-11 DIAGNOSIS — J42 Unspecified chronic bronchitis: Secondary | ICD-10-CM | POA: Diagnosis not present

## 2018-06-11 DIAGNOSIS — E039 Hypothyroidism, unspecified: Secondary | ICD-10-CM | POA: Diagnosis not present

## 2018-06-11 DIAGNOSIS — D509 Iron deficiency anemia, unspecified: Secondary | ICD-10-CM | POA: Diagnosis not present

## 2018-06-11 NOTE — Progress Notes (Signed)
Location:  [facility] Room Number: 25  Place of Service:  SNF (31) Provider: Leeza Heiner FNP-C   Mast, Man X, NP  Patient Care Team: Mast, Man X, NP as PCP - General (Internal Medicine) Newton Pigg, MD as Consulting Physician (Obstetrics and Gynecology) Monna Fam, MD as Consulting Physician (Ophthalmology) Melida Quitter, MD as Consulting Physician (Otolaryngology) Myrlene Broker, MD as Consulting Physician (Urology) [place], [facility] Virgie Dad, MD as Consulting Physician (Internal Medicine)  Extended Emergency Contact Information Primary Emergency Contact: Bledsoe,[name] Address: [place], [place] Phone: [phone] Work Phone: [phone] Mobile Phone: [phone] Relation: Son Secondary Emergency Contact: Teofilo [place] of [place] Phone: [phone] Work Phone: [phone] Relation: Friend  Code Status: DNR  Goals of care: Advanced Directive information Advanced Directives 06/05/2018  Does Patient Have a Medical Advance Directive? Yes  Type of Paramedic of [place];Living will;Out of facility DNR (pink MOST or yellow form)  Does patient want to make changes to medical advance directive? No - Patient declined  Copy of [place] in Chart? Yes - validated most recent copy scanned in chart (See row information)  Pre-existing out of facility DNR order (yellow form or pink MOST form) Yellow form placed in chart (order not valid for inpatient use);Pink MOST form placed in chart (order not valid for inpatient use)     Chief Complaint  Patient presents with   Medical Management of Chronic Issues    HPI:  Pt is a 85 y.o. female seen today [facility] for medical management of chronic diseases. She has a medical history of COPD,PVD,Hypothyroidism,sinus bradycardia,GERD,Depression,Osteoarthritis,osteoporosis  among other conditions.she is seen in her room today lying in the bed.she denies any acute issues during visit.Facility Nurse reports no new concerns.she states has been communicating with family members via phone.No recent fall episiodes.Her weight fluctuates in the 130's.Her weekly CBG log reviewed readings in the 90's-110's.   Past Medical History:  Diagnosis Date   Asthma    Carpal tunnel syndrome 03/07/2009   Disturbance of skin sensation 02/21/2009   Diverticulosis of colon (without mention of hemorrhage) 02/16/2009   Fracture of upper end of left humerus 11/18/14   Hypertonicity of bladder 02/16/2009   Hypothyroid    Osteoarthrosis, unspecified whether generalized or localized, unspecified site 02/16/2009   Other abnormal blood chemistry 06/19/2010   hyperglycemia   Other and unspecified hyperlipidemia 10/24/2009   Other atopic dermatitis and related conditions 02/16/2009   Otosclerosis, unspecified 02/22/2003   Pain in limb 12/05/2009   Senile osteoporosis 06/16/2010   Sensorineural hearing loss, unspecified 02/21/2009   Syncope and collapse 05/30/2009   Unspecified nasal polyp 02/21/2009   Unspecified urinary incontinence 02/21/2009   Past Surgical History:  Procedure Laterality Date   ABDOMINAL HYSTERECTOMY  1999   Dr. Collier Bullock   BREAST BIOPSY  07/16/1990   benign left breast needle biopsy  Dr. Margot Chimes   CARPAL TUNNEL RELEASE  04/07/2009   right Dr. Daylene Katayama   CATARACT EXTRACTION W/ INTRAOCULAR LENS  IMPLANT, BILATERAL Bilateral    INTRAMEDULLARY (IM) NAIL INTERTROCHANTERIC Right 09/17/2017   Procedure: INTRAMEDULLARY (IM) NAIL INTERTROCHANTRIC HIP;  Surgeon: Nicholes Stairs, MD;  Location: [place];  Service: Orthopedics;  Laterality: Right;   OPEN REDUCTION INTERNAL FIXATION (ORIF) DISTAL RADIAL FRACTURE Right 09/17/2017   Procedure: OPEN REDUCTION INTERNAL FIXATION (ORIF) DISTAL RADIAL FRACTURE;  Surgeon: Iran Planas, MD;  Location: [place];  Service: Orthopedics;   Laterality: Right;   TRIGGER FINGER RELEASE Left 08/09/2014   Procedure: RELEASE TRIGGER FINGER/A-1 PULLEY LEFT MIDDLE FINGER;  Surgeon: Daryll Brod, MD;  Location: [address];  Service: Orthopedics;  Laterality: Left;  REGIONAL/FAB    Allergies  Allergen Reactions   Sulfa Antibiotics Itching and Rash   Amoxicillin Other (See Comments)    Per [facility] 09/15/17 Has patient had a PCN reaction causing immediate rash, facial/tongue/throat swelling, SOB or lightheadedness with hypotension: Unknown Has patient had a PCN reaction causing severe rash involving mucus membranes or skin necrosis: Unknown Has patient had a PCN reaction that required hospitalization: Unknown Has patient had a PCN reaction occurring within the last 10 years: Unknown If all of the above answers are "NO", then may proceed with Cephalosporin use.   Avelox [Moxifloxacin Hcl In Nacl] Itching   Doxycycline Other (See Comments)    Unknown reaction   Hista-Tabs [Triprolidine-Pse] Other (See Comments)    Passed out   Pyrilamine-Phenylephrine Other (See Comments)    Unknown reaction - per [facility] 09/15/17   Septra [Sulfamethoxazole-Trimethoprim] Other (See Comments)    Unknown reaction    Allergies as of 06/11/2018      Reactions   Sulfa Antibiotics Itching, Rash   Amoxicillin Other (See Comments)   Per [facility] 09/15/17 Has patient had a PCN reaction causing immediate rash, facial/tongue/throat swelling, SOB or lightheadedness with hypotension: Unknown Has patient had a PCN reaction causing severe rash involving mucus membranes or skin necrosis: Unknown Has patient had a PCN reaction that required hospitalization: Unknown Has patient had a PCN reaction occurring within the last 10 years: Unknown If all of the above answers are "NO", then may proceed with Cephalosporin use.   Avelox [moxifloxacin Hcl In Nacl] Itching   Doxycycline Other (See Comments)   Unknown reaction   Hista-tabs [triprolidine-pse] Other (See  Comments)   Passed out   Pyrilamine-phenylephrine Other (See Comments)   Unknown reaction - per [facility] 09/15/17   Septra [sulfamethoxazole-trimethoprim] Other (See Comments)   Unknown reaction      Medication List       Accurate as of Jun 11, 2018  2:32 PM. If you have any questions, ask your nurse or doctor.        aspirin EC 81 MG tablet Take 81 mg by mouth daily. On Tuesday and Saturday   escitalopram 10 MG tablet Commonly known as:  LEXAPRO Take 20 mg by mouth daily. Take 2 tablets  to equal 20 mg.   famotidine 20 MG tablet Commonly known as:  PEPCID Take 20 mg by mouth daily.   feeding supplement Liqd Take 100-237 mLs by mouth 2 (two) times daily.   ferrous sulfate 325 (65 FE) MG tablet Take 325 mg by mouth daily with breakfast.   Fluticasone-Salmeterol 250-50 MCG/DOSE Aepb Commonly known as:  ADVAIR Inhale 1 puff 2 (two) times daily into the lungs. Rinse mouth after use   furosemide 40 MG tablet Commonly known as:  LASIX Take 40 mg by mouth daily. If systolic B/P is <814 hold   ipratropium 0.02 % nebulizer solution Commonly known as:  ATROVENT Take 0.5 mg by nebulization every 6 (six) hours as needed for wheezing or shortness of breath.   levothyroxine 25 MCG tablet Commonly known as:  SYNTHROID Take 25 mcg by mouth daily before breakfast.   mirtazapine 7.5 MG tablet Commonly known as:  REMERON Take 7.5 mg by mouth daily.  OXYGEN Inhale 2 L/min into the lungs as needed (to keep O2 SATS >90%).   polyethylene glycol 17 g packet Commonly known as:  MIRALAX / GLYCOLAX Take 17 g by mouth daily.   raloxifene 60 MG tablet Commonly known as:  EVISTA Take 1 tablet by mouth  daily to treat osteoporosis   TYLENOL 500 MG tablet Generic drug:  acetaminophen Take 1,000 mg by mouth 3 (three) times daily.   Ventolin HFA 108 (90 Base) MCG/ACT inhaler Generic drug:  albuterol Inhale 2 puffs into the lungs every 6 (six) hours as needed for shortness of breath  (dyspnea).   zinc oxide 20 % ointment Apply 1 application topically 2 (two) times daily. Also apply every Shift - PRN  Apply to buttocks as needed every shift after incontinent episodes       Review of Systems  Constitutional: Negative for activity change, appetite change, chills, fatigue, fever and unexpected weight change.  HENT: Positive for hearing loss. Negative for congestion, rhinorrhea, sinus pressure, sinus pain, sneezing, sore throat and trouble swallowing.   Eyes: Negative for pain, discharge, redness and itching.  Respiratory: Negative for cough, chest tightness, shortness of breath and wheezing.   Cardiovascular: Negative for chest pain, palpitations and leg swelling.  Gastrointestinal: Negative for abdominal distention, abdominal pain, constipation, diarrhea, nausea and vomiting.  Endocrine: Negative for cold intolerance, heat intolerance, polydipsia, polyphagia and polyuria.  Genitourinary: Negative for difficulty urinating, dysuria, flank pain and urgency.  Musculoskeletal: Positive for arthralgias and gait problem.  Skin: Negative for color change, pallor, rash and wound.  Neurological: Negative for dizziness, light-headedness, numbness and headaches.  Hematological: Does not bruise/bleed easily.  Psychiatric/Behavioral: Negative for agitation, confusion and sleep disturbance. The patient is not nervous/anxious.     Immunization History  Administered Date(s) Administered   Influenza Whole 12/27/2011, 11/05/2012   Influenza-Unspecified 11/18/2013, 11/03/2014, 11/16/2015, 11/13/2016, 11/17/2017   PPD Test 04/04/2008, 11/23/2014   Pneumococcal Conjugate-13 09/30/2016   Pneumococcal Polysaccharide-23 02/05/2008, 06/03/2008   Td 02/05/2008, 06/03/2008   Pertinent  Health Maintenance Due  Topic Date Due   INFLUENZA VACCINE  09/05/2018   DEXA SCAN  Completed   PNA vac Low Risk Adult  Completed   Fall Risk  10/09/2017 09/23/2016 08/27/2016 05/30/2015 05/16/2015    Falls in the past year? Yes Yes Yes Yes No  Comment - - Emmi Telephone Survey: data to providers prior to load - -  Number falls in past yr: 2 or more 1 1 1  -  Comment - - Emmi Telephone Survey Actual Response = 1 - -  Injury with Fall? Yes No No Yes -  Comment broke R wrist - - - -  Risk Factor Category  - - - - -  Risk for fall due to : - - - History of fall(s);Impaired balance/gait;Impaired mobility -  Follow up - - - Falls evaluation completed;Education provided -    Vitals:   06/11/18 1307  BP: (!) 133/56  Pulse: (!) 50  Resp: 16  Temp: (!) 96.6 F (35.9 C)  SpO2: 94%  Weight: 136 lb 8 oz (61.9 kg)  Height: 4\' 8"  (1.422 m)   Body mass index is 30.6 kg/m. Physical Exam Vitals signs and nursing note reviewed.  Constitutional:      General: She is not in acute distress.    Appearance: She is not ill-appearing.  HENT:     Head: Normocephalic.     Right Ear: Tympanic membrane, ear canal and external ear normal. There is no  impacted cerumen.     Left Ear: Tympanic membrane, ear canal and external ear normal. There is no impacted cerumen.     Nose: Nose normal. No congestion or rhinorrhea.     Mouth/Throat:     Mouth: Mucous membranes are moist.     Pharynx: Oropharynx is clear. No oropharyngeal exudate or posterior oropharyngeal erythema.  Eyes:     General: No scleral icterus.       Right eye: No discharge.        Left eye: No discharge.     Conjunctiva/sclera: Conjunctivae normal.     Pupils: Pupils are equal, round, and reactive to light.  Neck:     Musculoskeletal: Normal range of motion. No neck rigidity or muscular tenderness.  Cardiovascular:     Rate and Rhythm: Regular rhythm. Bradycardia present.     Pulses: Normal pulses.     Heart sounds: Murmur present. No friction rub. No gallop.   Pulmonary:     Effort: Pulmonary effort is normal. No respiratory distress.     Breath sounds: Normal breath sounds. No wheezing or rhonchi.  Chest:     Chest wall: No  tenderness.  Abdominal:     General: Bowel sounds are normal. There is no distension.     Palpations: Abdomen is soft. There is no mass.     Tenderness: There is no abdominal tenderness. There is no right CVA tenderness, left CVA tenderness, guarding or rebound.  Genitourinary:    Comments: Incontinent  Musculoskeletal:        General: No swelling or tenderness.     Right lower leg: No edema.     Left lower leg: No edema.     Comments: Moves x 4 extremities self propels on wheelchair   Lymphadenopathy:     Cervical: No cervical adenopathy.  Skin:    General: Skin is warm and dry.     Coloration: Skin is not pale.     Findings: No bruising, erythema or rash.  Neurological:     Mental Status: She is alert.     Coordination: Coordination normal.     Gait: Gait abnormal.     Comments: Alert and oriented to person and place.HOH   Psychiatric:        Mood and Affect: Mood normal.        Behavior: Behavior normal.        Thought Content: Thought content normal.        Judgment: Judgment normal.    Labs reviewed: Recent Labs    09/19/17 0655 09/20/17 0330 09/21/17 0235 09/22/17 09/29/17 03/19/18  NA 142 143 143 142 143 142  K 3.9 4.3 4.4 4.9 4.5 4.3  CL 111 112* 112*  --   --   --   CO2 25 25 26   --   --   --   GLUCOSE 108* 129* 112*  --   --   --   BUN 23 23 22 21 19  27*  CREATININE 0.98 0.81 0.80 0.76 0.96 1.1  CALCIUM 8.1* 8.1* 8.4* 8.6 9.0  --   MG 2.3 2.3 2.2  --   --   --    Recent Labs    09/29/17 03/19/18  AST 15 14  ALT 12 11  ALKPHOS 82  --   BILITOT 0.9  --   PROT 6.2  --   ALBUMIN 3.4  --    Recent Labs    09/15/17 1907 09/16/17 0407  09/20/17 0330 09/21/17  1165 09/22/17 09/29/17 03/19/18  WBC 16.6* 11.2*   < > 9.1 10.9* 10.4 9.1 6.0  NEUTROABS 14.0* 8.7*  --   --   --   --   --   --   HGB 12.7 11.4*   < > 8.4* 8.9* 9.5 10.4 12.3  HCT 40.3 37.2   < > 26.7* 28.5* 28.3 31 38  MCV 93.3 94.7   < > 96.4 96.3 91.3 93.1  --   PLT 252 251   < > 210 263   --   --  245   < > = values in this interval not displayed.   Lab Results  Component Value Date   TSH 1.62 05/11/2018   Lab Results  Component Value Date   HGBA1C 6.0 (H) 09/16/2017   Lab Results  Component Value Date   CHOL 211 (A) 02/14/2014   HDL 47 02/14/2014   LDLCALC 133 02/14/2014   TRIG 155 02/14/2014    Significant Diagnostic Results in last 30 days:  No results found.  Assessment/Plan 1. Acquired hypothyroidism Lab Results  Component Value Date   TSH 1.62 05/11/2018  Continue on levothyroxine 25 mcg tablet daily.  2. Iron deficiency anemia, unspecified iron deficiency anemia type Last Hgb stable.continue on ferrous sulfate daily. -CBC/diff 06/15/2018   3. Major depression, chronic Mood stable.Recent TSH level.continue on escitalopram 20 mg tablet and Remeron 7.5 mg tablet daily.will monitor for mood changes.   4. Chronic bronchitis, unspecified chronic bronchitis type (HCC) Breathing stable on room air.continue albuterol inhaler 2 puffs every 6 hours as needed,advair 1 puff twice daily and ipratropium 0.5 mg neb solution every 6 hours as needed. -CMP 06/15/2018     5. Primary osteoarthritis involving multiple joints Tylenol 1000 mg tablet three times daily effective.continue to monitor.   6. Prediabetes  Lab Results  Component Value Date   HGBA1C 6.0 (H) 09/16/2017  CBG checked weekly readings ranging in the 90's -110's.will Recheck Hgb A1C 06/15/2018.if Hgb A1C remains stable or < 6.0 will consider discontinue CBG monitoring.   Family/ staff Communication: Reviewed plan of care with patient and facility Nurse supervisor   Labs/tests ordered: CBC/diff, CMP and Hgb A1C  06/15/2018    Nemesis Rainwater C Johnna Bollier, NP

## 2018-06-15 DIAGNOSIS — D509 Iron deficiency anemia, unspecified: Secondary | ICD-10-CM | POA: Diagnosis not present

## 2018-06-15 DIAGNOSIS — S72141D Displaced intertrochanteric fracture of right femur, subsequent encounter for closed fracture with routine healing: Secondary | ICD-10-CM | POA: Diagnosis not present

## 2018-06-15 DIAGNOSIS — E039 Hypothyroidism, unspecified: Secondary | ICD-10-CM | POA: Diagnosis not present

## 2018-06-15 DIAGNOSIS — R739 Hyperglycemia, unspecified: Secondary | ICD-10-CM | POA: Diagnosis not present

## 2018-06-15 DIAGNOSIS — J449 Chronic obstructive pulmonary disease, unspecified: Secondary | ICD-10-CM | POA: Diagnosis not present

## 2018-06-15 DIAGNOSIS — I739 Peripheral vascular disease, unspecified: Secondary | ICD-10-CM | POA: Diagnosis not present

## 2018-06-15 DIAGNOSIS — R609 Edema, unspecified: Secondary | ICD-10-CM | POA: Diagnosis not present

## 2018-06-15 DIAGNOSIS — R799 Abnormal finding of blood chemistry, unspecified: Secondary | ICD-10-CM | POA: Diagnosis not present

## 2018-06-17 LAB — HEPATIC FUNCTION PANEL
ALT: 11 (ref 7–35)
AST: 14 (ref 13–35)
Alkaline Phosphatase: 45 (ref 25–125)
Bilirubin, Total: 0.3

## 2018-06-17 LAB — BASIC METABOLIC PANEL
BUN: 27 — AB (ref 4–21)
Creatinine: 1 (ref 0.5–1.1)
Glucose: 94
Potassium: 4 (ref 3.4–5.3)
Sodium: 143 (ref 137–147)

## 2018-06-17 LAB — CBC AND DIFFERENTIAL
HCT: 39 (ref 36–46)
Hemoglobin: 12.9 (ref 12.0–16.0)
Platelets: 224 (ref 150–399)
WBC: 5.4

## 2018-06-17 LAB — HEMOGLOBIN A1C: Hemoglobin A1C: 6

## 2018-07-10 ENCOUNTER — Encounter (HOSPITAL_COMMUNITY): Payer: Self-pay

## 2018-07-10 ENCOUNTER — Encounter: Payer: Self-pay | Admitting: Nurse Practitioner

## 2018-07-10 ENCOUNTER — Non-Acute Institutional Stay (SKILLED_NURSING_FACILITY): Payer: Medicare Other | Admitting: Nurse Practitioner

## 2018-07-10 ENCOUNTER — Other Ambulatory Visit: Payer: Self-pay

## 2018-07-10 ENCOUNTER — Emergency Department (HOSPITAL_COMMUNITY)
Admission: EM | Admit: 2018-07-10 | Discharge: 2018-07-10 | Disposition: A | Discharge status: Home / Self Care | Payer: Medicare Other | Attending: Emergency Medicine | Admitting: Emergency Medicine

## 2018-07-10 ENCOUNTER — Emergency Department (HOSPITAL_COMMUNITY): Payer: Medicare Other

## 2018-07-10 DIAGNOSIS — R269 Unspecified abnormalities of gait and mobility: Secondary | ICD-10-CM

## 2018-07-10 DIAGNOSIS — M79601 Pain in right arm: Secondary | ICD-10-CM | POA: Diagnosis not present

## 2018-07-10 DIAGNOSIS — R609 Edema, unspecified: Secondary | ICD-10-CM | POA: Diagnosis not present

## 2018-07-10 DIAGNOSIS — Y998 Other external cause status: Secondary | ICD-10-CM | POA: Diagnosis not present

## 2018-07-10 DIAGNOSIS — W050XXA Fall from non-moving wheelchair, initial encounter: Secondary | ICD-10-CM | POA: Insufficient documentation

## 2018-07-10 DIAGNOSIS — D509 Iron deficiency anemia, unspecified: Secondary | ICD-10-CM | POA: Diagnosis not present

## 2018-07-10 DIAGNOSIS — E039 Hypothyroidism, unspecified: Secondary | ICD-10-CM

## 2018-07-10 DIAGNOSIS — Y9389 Activity, other specified: Secondary | ICD-10-CM | POA: Diagnosis not present

## 2018-07-10 DIAGNOSIS — R51 Headache: Secondary | ICD-10-CM | POA: Diagnosis not present

## 2018-07-10 DIAGNOSIS — M159 Polyosteoarthritis, unspecified: Secondary | ICD-10-CM

## 2018-07-10 DIAGNOSIS — F339 Major depressive disorder, recurrent, unspecified: Secondary | ICD-10-CM | POA: Diagnosis not present

## 2018-07-10 DIAGNOSIS — M150 Primary generalized (osteo)arthritis: Secondary | ICD-10-CM | POA: Diagnosis not present

## 2018-07-10 DIAGNOSIS — J45909 Unspecified asthma, uncomplicated: Secondary | ICD-10-CM | POA: Insufficient documentation

## 2018-07-10 DIAGNOSIS — F0390 Unspecified dementia without behavioral disturbance: Secondary | ICD-10-CM | POA: Insufficient documentation

## 2018-07-10 DIAGNOSIS — Z66 Do not resuscitate: Secondary | ICD-10-CM | POA: Diagnosis not present

## 2018-07-10 DIAGNOSIS — M2550 Pain in unspecified joint: Secondary | ICD-10-CM | POA: Diagnosis not present

## 2018-07-10 DIAGNOSIS — Z7401 Bed confinement status: Secondary | ICD-10-CM | POA: Diagnosis not present

## 2018-07-10 DIAGNOSIS — S4991XA Unspecified injury of right shoulder and upper arm, initial encounter: Secondary | ICD-10-CM | POA: Diagnosis present

## 2018-07-10 DIAGNOSIS — Z79899 Other long term (current) drug therapy: Secondary | ICD-10-CM | POA: Insufficient documentation

## 2018-07-10 DIAGNOSIS — Y92129 Unspecified place in nursing home as the place of occurrence of the external cause: Secondary | ICD-10-CM | POA: Insufficient documentation

## 2018-07-10 DIAGNOSIS — S42309A Unspecified fracture of shaft of humerus, unspecified arm, initial encounter for closed fracture: Secondary | ICD-10-CM | POA: Insufficient documentation

## 2018-07-10 DIAGNOSIS — R410 Disorientation, unspecified: Secondary | ICD-10-CM | POA: Diagnosis not present

## 2018-07-10 DIAGNOSIS — J449 Chronic obstructive pulmonary disease, unspecified: Secondary | ICD-10-CM | POA: Diagnosis not present

## 2018-07-10 DIAGNOSIS — I959 Hypotension, unspecified: Secondary | ICD-10-CM | POA: Diagnosis not present

## 2018-07-10 DIAGNOSIS — F329 Major depressive disorder, single episode, unspecified: Secondary | ICD-10-CM

## 2018-07-10 DIAGNOSIS — Z7982 Long term (current) use of aspirin: Secondary | ICD-10-CM | POA: Insufficient documentation

## 2018-07-10 DIAGNOSIS — K5900 Constipation, unspecified: Secondary | ICD-10-CM | POA: Diagnosis not present

## 2018-07-10 DIAGNOSIS — J42 Unspecified chronic bronchitis: Secondary | ICD-10-CM | POA: Diagnosis not present

## 2018-07-10 DIAGNOSIS — M25511 Pain in right shoulder: Secondary | ICD-10-CM | POA: Diagnosis not present

## 2018-07-10 DIAGNOSIS — S42291A Other displaced fracture of upper end of right humerus, initial encounter for closed fracture: Secondary | ICD-10-CM | POA: Insufficient documentation

## 2018-07-10 DIAGNOSIS — R4189 Other symptoms and signs involving cognitive functions and awareness: Secondary | ICD-10-CM

## 2018-07-10 DIAGNOSIS — M25519 Pain in unspecified shoulder: Secondary | ICD-10-CM | POA: Diagnosis not present

## 2018-07-10 DIAGNOSIS — W19XXXA Unspecified fall, initial encounter: Secondary | ICD-10-CM | POA: Diagnosis not present

## 2018-07-10 DIAGNOSIS — R5381 Other malaise: Secondary | ICD-10-CM | POA: Diagnosis not present

## 2018-07-10 NOTE — Discharge Instructions (Addendum)
Follow-up with Dr. Stann Mainland as needed for further management.  Take Tylenol as needed for the pain.

## 2018-07-10 NOTE — ED Notes (Signed)
Report given to [place] at [facility].

## 2018-07-10 NOTE — ED Triage Notes (Signed)
Pt  arrived via EMS from [facility]. Pt had an unwitnessed fall yesterday out of wheelchair. Per facility, pt had no complaints at this the time and was not sent for evaluation. Pt today presents c/o RT shoulder pain, area is swollen and worsening pain with movement. Facility performed mobile x-ray and pt was positive for FX. Provider sent pt to be further evaluated,  Hx of copd  **DNR/MOST     EMS v/s 132/70, HR 60, RR 16, 96% RA.

## 2018-07-10 NOTE — Assessment & Plan Note (Signed)
07/09/18 sustained from a mechanical fall at her room SNF [facility]. ED eval and treat as indicated.

## 2018-07-10 NOTE — ED Notes (Signed)
Attempted to call pt son x 2.

## 2018-07-10 NOTE — ED Provider Notes (Signed)
[facility] DEPT Provider Note   CSN: [id number] Arrival date & time: 07/10/18  1150    History   Chief Complaint Chief Complaint  Patient presents with  . Fall  . Shoulder Pain    HPI Kimberly Greer is a 85 y.o. female.     HPI Patient presents after fall yesterday.  Reportedly fell when she was dizzy landing on her right shoulder.  Reported outside x-ray done and showed fracture although this was not sent with the patient or her report.  Patient has a most form that does not want IV fluids or antibiotics.  Only comfort care.  Sent in for comfort care of the likely fracture.  She is not on anticoagulation.  Complaining only of pain in the shoulder. Past Medical History:  Diagnosis Date  . Asthma   . Carpal tunnel syndrome 03/07/2009  . Disturbance of skin sensation 02/21/2009  . Diverticulosis of colon (without mention of hemorrhage) 02/16/2009  . Fracture of upper end of left humerus 11/18/14  . 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 Hypothyroid   . Osteoarthrosis, unspecified whether generalized or localized, unspecified site 02/16/2009  . Other abnormal blood chemistry 06/19/2010   hyperglycemia  . Other and unspecified hyperlipidemia 10/24/2009  . Other atopic dermatitis and related conditions 02/16/2009  . Otosclerosis, unspecified 02/22/2003  . Pain in limb 12/05/2009  . Senile osteoporosis 06/16/2010  . Sensorineural hearing loss, unspecified 02/21/2009  . Syncope and collapse 05/30/2009  . Unspecified nasal polyp 02/21/2009  . Unspecified urinary incontinence 02/21/2009    Patient Active Problem List   Diagnosis Date Noted  . Fracture, humerus closed 07/10/2018  . Chronic rhinitis 04/10/2018  . Iron deficiency anemia 03/17/2018  . Constipation 02/17/2018  . Cognitive impairment 09/22/2017  . Goals of care, counseling/discussion   . Fall   . Closed fracture of right distal radius   . Hip fracture ([place]) 09/15/2017  . Closed displaced  intertrochanteric fracture of right femur ([place]) 09/15/2017  . COPD (chronic obstructive pulmonary disease) ([place]) 09/15/2017  . PVD (peripheral vascular disease) ([place]) 08/25/2017  . Depression, recurrent ([place]) 05/29/2017  . Sinus bradycardia 08/26/2016  . Major depression, chronic 08/26/2016  . Adult failure to thrive 05/10/2016  . Bilateral knee pain 04/25/2015  . Edema 01/03/2015  . GERD (gastroesophageal reflux disease) 11/25/2014  . Osteoporosis 11/25/2014  . Abnormality of gait 03/08/2014  . Trigger finger, acquired 03/08/2014  . Dyspnea 02/09/2013  . Intrinsic asthma 02/09/2013  . Hypothyroid   . Hyperglycemia 06/19/2010  . Hyperlipemia 10/24/2009  . Sensorineural hearing loss 02/21/2009  . OAB (overactive bladder) 02/16/2009  . Osteoarthritis 02/16/2009    Past Surgical History:  Procedure Laterality Date  . ABDOMINAL HYSTERECTOMY  1999   Dr. Collier Bullock  . BREAST BIOPSY  07/16/1990   benign left breast needle biopsy  Dr. Margot Chimes  . CARPAL TUNNEL RELEASE  04/07/2009   right Dr. Daylene Katayama  . CATARACT EXTRACTION W/ INTRAOCULAR LENS  IMPLANT, BILATERAL Bilateral   . INTRAMEDULLARY (IM) NAIL INTERTROCHANTERIC Right 09/17/2017   Procedure: INTRAMEDULLARY (IM) NAIL INTERTROCHANTRIC HIP;  Surgeon: Nicholes Stairs, MD;  Location: [facility];  Service: Orthopedics;  Laterality: Right;  . OPEN REDUCTION INTERNAL FIXATION (ORIF) DISTAL RADIAL FRACTURE Right 09/17/2017   Procedure: OPEN REDUCTION INTERNAL FIXATION (ORIF) DISTAL RADIAL FRACTURE;  Surgeon: Iran Planas, MD;  Location: [facility];  Service: Orthopedics;  Laterality: Right;  . TRIGGER FINGER RELEASE Left 08/09/2014   Procedure: RELEASE TRIGGER FINGER/A-1 PULLEY LEFT MIDDLE FINGER;  Surgeon: Daryll Brod, MD;  Location: [facility];  Service: Orthopedics;  Laterality: Left;  REGIONAL/FAB     OB History   No obstetric history on file.      Home Medications    Prior to Admission medications   Medication Sig Start Date  End Date Taking? Authorizing Provider  acetaminophen (TYLENOL) 325 MG tablet Take 650 mg by mouth every 4 (four) hours as needed. Administer 650mg  every four hours PRN for minor pain/headache x 48 hours. Notify MD of continued pain/headache. Do not exceed 3,000 mg in 24 hours. Document area of discomfort and effectiveness of medication. 07/10/18 07/12/18 Yes [provider]  acetaminophen (TYLENOL) 500 MG tablet Take 1,000 mg by mouth 3 (three) times daily.   Yes [provider]  albuterol (VENTOLIN HFA) 108 (90 Base) MCG/ACT inhaler Inhale 2 puffs into the lungs every 6 (six) hours as needed for shortness of breath (dyspnea).    Yes [provider]  aspirin EC 81 MG tablet Take 81 mg by mouth daily. On Tuesday and Saturday   Yes [provider]  escitalopram (LEXAPRO) 20 MG tablet Take 20 mg by mouth daily.   Yes [provider]  famotidine (PEPCID) 20 MG tablet Take 20 mg by mouth daily.   Yes [provider]  feeding supplement (BOOST HIGH PROTEIN) LIQD Take 237 mLs by mouth 2 (two) times daily. Add ice cream to boost Plus twice a day   Yes [provider]  ferrous sulfate 325 (65 FE) MG tablet Take 325 mg by mouth daily with breakfast.   Yes [provider]  Fluticasone-Salmeterol (ADVAIR) 250-50 MCG/DOSE AEPB Inhale 1 puff 2 (two) times daily into the lungs. Rinse mouth after use   Yes [provider]  furosemide (LASIX) 20 MG tablet Take 20 mg by mouth daily. Hold for SBP < 110   Yes [provider]  levothyroxine (SYNTHROID, LEVOTHROID) 25 MCG tablet Take 25 mcg by mouth daily before breakfast.   Yes [provider]  mirtazapine (REMERON) 15 MG tablet Take 7.5 mg by mouth at bedtime.    Yes [provider]  OXYGEN Inhale 2 L/min into the lungs as needed (to keep O2 SATS >90%).    Yes [provider]  polyethylene glycol (MIRALAX / GLYCOLAX) packet Take 17 g by mouth daily.   Yes  [provider]  raloxifene (EVISTA) 60 MG tablet Take 1 tablet by mouth  daily to treat osteoporosis Patient taking differently: Take 60 mg by mouth daily.  09/23/14  Yes [name], MD  zinc oxide 20 % ointment Apply 1 application topically 2 (two) times daily. Also apply every Shift - PRN  Apply to buttocks as needed every shift after incontinent episodes   Yes [provider]    Family History Family History  Problem Relation Age of Onset  . Cancer Mother        stomach  . Cancer Father        bone  . Dementia Sister   . Cancer Brother        pancreataic  . Cancer Son        pancreatic    Social History Social History   Tobacco Use  . Smoking status: Never Smoker  . Smokeless tobacco: Never Used  Substance Use Topics  . Alcohol use: No  . Drug use: No     Allergies   Sulfa antibiotics; Amoxicillin; Avelox [moxifloxacin hcl in nacl]; Doxycycline; Hista-tabs [  triprolidine-pse]; Pyrilamine-phenylephrine; and Septra [sulfamethoxazole-trimethoprim]   Review of Systems Review of Systems  Unable to perform ROS: Dementia     Physical Exam Updated Vital Signs BP (!) 124/49   Pulse (!) 55   Temp 98 F (36.7 C) (Oral)   Resp 17   SpO2 96%   Physical Exam Vitals signs and nursing note reviewed.  HENT:     Head: Normocephalic.  Neck:     Musculoskeletal: Neck supple.  Cardiovascular:     Rate and Rhythm: Regular rhythm.  Pulmonary:     Breath sounds: No wheezing or rhonchi.  Abdominal:     Tenderness: There is no abdominal tenderness.  Musculoskeletal:     Comments: Tenderness and ecchymosis over proximal right humerus.  Neurovascular intact in right hand.  No tenderness over wrist or elbow.  No tenderness over chest.  Skin:    General: Skin is warm.     Capillary Refill: Capillary refill takes less than 2 seconds.  Neurological:     Mental Status: She is alert.     Comments: Patient awake and pleasant but appears to have some  confusion.      ED Treatments / Results  Labs (all labs ordered are listed, but only abnormal results are displayed) Labs Reviewed - No data to display  EKG None  Radiology Dg Shoulder Right  Result Date: 07/10/2018 CLINICAL DATA:  Right shoulder pain following a fall. EXAM: RIGHT SHOULDER - 2+ VIEW COMPARISON:  09/15/2017. FINDINGS: Interval comminuted fracture of the right humeral neck and greater tuberosity. Medial and posterior displacement of the distal fragment with mild lateral angulation of the distal fragment. IMPRESSION: Comminuted right humeral neck and greater tuberosity fracture. Electronically Signed   By: Claudie Revering M.D.   On: 07/10/2018 13:27    Procedures Procedures (including critical care time)  Medications Ordered in ED Medications - No data to display   Initial Impression / Assessment and Plan / ED Course  I have reviewed the triage vital signs and the nursing notes.  Pertinent labs & imaging results that were available during my care of the patient were reviewed by me and considered in my medical decision making (see chart for details).     Patient with proximal humerus fracture.  Fall yesterday.  Patient is DNR and comfort care only.  Would not want IV fluids or admission.  Discussed with Dr. Doran Durand from [facility].  Will give a sling right now for comfort.  Follow-up with Dr. Stann Mainland, who is seen the patient previously, as an outpatient.  Pain should improve in around 10 days.  Final Clinical Impressions(s) / ED Diagnoses   Final diagnoses:  Other closed displaced fracture of proximal end of right humerus, initial encounter    ED Discharge Orders    None       Davonna Belling, MD 07/10/18 [phone]

## 2018-07-10 NOTE — Progress Notes (Signed)
Location:  [facility] Room Number: 25 Place of Service:  SNF (31) Provider: Andrzej Scully X Bobbie Valletta, NP  Tommie Bohlken X, NP  Patient Care Team: Daysean Tinkham X, NP as PCP - General (Internal Medicine) Newton Pigg, MD as Consulting Physician (Obstetrics and Gynecology) Monna Fam, MD as Consulting Physician (Ophthalmology) Melida Quitter, MD as Consulting Physician (Otolaryngology) Myrlene Broker, MD as Consulting Physician (Urology) [place], [facility] Virgie Dad, MD as Consulting Physician (Internal Medicine)  Extended Emergency Contact Information Primary Emergency Contact: Cuevas,[name] Address: [place], [place] Phone: [phone] Work Phone: [phone] Mobile Phone: [phone] Relation: Son Secondary Emergency Contact: Teofilo [place] Phone: [phone] Work Phone: [phone] Relation: Friend  Code Status:  DNR Goals of care: Advanced Directive information Advanced Directives 07/10/2018  Does Patient Have a Medical Advance Directive? Yes  Type of Paramedic of [place];Living will;Out of facility DNR (pink MOST or yellow form)  Does patient want to make changes to medical advance directive? No - Patient declined  Copy of [place] in Chart? Yes - validated most recent copy scanned in chart (See row information)  Pre-existing out of facility DNR order (yellow form or pink MOST form) Yellow form placed in chart (order not valid for inpatient use);Pink MOST form placed in chart (order not valid for inpatient use)     Chief Complaint  Patient presents with  . Medical Management of Chronic Issues    Routine Visit    HPI:  Pt is a 85 y.o. female seen today for medical management of chronic diseases.    The patient sustained right shoulder fracture from fall 10/09/18, the patient stated she felt dizzy then fell on her way  to the bathroom. Staff reported unresponsiveness and low BP 88/47 when the patient was assessed. The patient stated her pain is 8/10. Her goal of care is comfort measures, no IVF/ABT. The patient agreed to ED eval and tx for comfort purpose.   Hx of constipation, on MiraLax qd. Depression, on Mirtazapine 7.5mg  qd, Lexapro 20mg  qd. Hypothyroidism, on Levothyroxine 15mcg qd, last TSH 1.62 05/11/18. Chronic edema BLE is controlled, on Furosemide 20mg  qd. COPD stable on Advair bid. Anemia, on Fe qd, last Hgb 12.9 06/17/18. GERD, stable on Famotidine 20mg  qd. OA pain, chronic, multiple sites, on Tylenol 1000mg  tid.    Past Medical History:  Diagnosis Date  . Asthma   . Carpal tunnel syndrome 03/07/2009  . Disturbance of skin sensation 02/21/2009  . Diverticulosis of colon (without mention of hemorrhage) 02/16/2009  . Fracture of upper end of left humerus 11/18/14  . 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 Hypothyroid   . Osteoarthrosis, unspecified whether generalized or localized, unspecified site 02/16/2009  . Other abnormal blood chemistry 06/19/2010   hyperglycemia  . Other and unspecified hyperlipidemia 10/24/2009  . Other atopic dermatitis and related conditions 02/16/2009  . Otosclerosis, unspecified 02/22/2003  . Pain in limb 12/05/2009  . Senile osteoporosis 06/16/2010  . Sensorineural hearing loss, unspecified 02/21/2009  . Syncope and collapse 05/30/2009  . Unspecified nasal polyp 02/21/2009  . Unspecified urinary incontinence 02/21/2009   Past Surgical History:  Procedure Laterality Date  . ABDOMINAL HYSTERECTOMY  1999   Dr. Collier Bullock  . BREAST BIOPSY  07/16/1990   benign left breast needle biopsy  Dr. Margot Chimes  . CARPAL TUNNEL RELEASE  04/07/2009  right Dr. Daylene Katayama  . CATARACT EXTRACTION W/ INTRAOCULAR LENS  IMPLANT, BILATERAL Bilateral   . INTRAMEDULLARY (IM) NAIL INTERTROCHANTERIC Right 09/17/2017   Procedure: INTRAMEDULLARY (IM) NAIL INTERTROCHANTRIC HIP;  Surgeon: Nicholes Stairs, MD;   Location: [facility];  Service: Orthopedics;  Laterality: Right;  . OPEN REDUCTION INTERNAL FIXATION (ORIF) DISTAL RADIAL FRACTURE Right 09/17/2017   Procedure: OPEN REDUCTION INTERNAL FIXATION (ORIF) DISTAL RADIAL FRACTURE;  Surgeon: Iran Planas, MD;  Location: [facility];  Service: Orthopedics;  Laterality: Right;  . TRIGGER FINGER RELEASE Left 08/09/2014   Procedure: RELEASE TRIGGER FINGER/A-1 PULLEY LEFT MIDDLE FINGER;  Surgeon: Daryll Brod, MD;  Location: [facility];  Service: Orthopedics;  Laterality: Left;  REGIONAL/FAB    Allergies  Allergen Reactions  . Sulfa Antibiotics Itching and Rash  . Amoxicillin Other (See Comments)    Per [facility] 09/15/17 Has patient had a PCN reaction causing immediate rash, facial/tongue/throat swelling, SOB or lightheadedness with hypotension: Unknown Has patient had a PCN reaction causing severe rash involving mucus membranes or skin necrosis: Unknown Has patient had a PCN reaction that required hospitalization: Unknown Has patient had a PCN reaction occurring within the last 10 years: Unknown If all of the above answers are "NO", then may proceed with Cephalosporin use.  . Avelox [Moxifloxacin Hcl In Nacl] Itching  . Doxycycline Other (See Comments)    Unknown reaction  . Hista-Tabs [Triprolidine-Pse] Other (See Comments)    Passed out  . Pyrilamine-Phenylephrine Other (See Comments)    Unknown reaction - per [facility] 09/15/17  . Septra [Sulfamethoxazole-Trimethoprim] Other (See Comments)    Unknown reaction    Outpatient Encounter Medications as of 07/10/2018  Medication Sig  . acetaminophen (TYLENOL) 325 MG tablet Take 650 mg by mouth every 4 (four) hours as needed. Administer 650mg  every four hours PRN for minor pain/headache x 48 hours. Notify MD of continued pain/headache. Do not exceed 3,000 mg in 24 hours. Document area of discomfort and effectiveness of medication.  Marland Kitchen acetaminophen (TYLENOL) 500 MG tablet Take 1,000 mg by mouth 3 (three) times  daily.  Marland Kitchen albuterol (VENTOLIN HFA) 108 (90 Base) MCG/ACT inhaler Inhale 2 puffs into the lungs every 6 (six) hours as needed for shortness of breath (dyspnea).   Marland Kitchen aspirin EC 81 MG tablet Take 81 mg by mouth daily. On Tuesday and Saturday  . escitalopram (LEXAPRO) 20 MG tablet Take 20 mg by mouth daily.  . famotidine (PEPCID) 20 MG tablet Take 20 mg by mouth daily.  . feeding supplement (BOOST HIGH PROTEIN) LIQD Take 237 mLs by mouth 2 (two) times daily. Add ice cream to boost Plus twice a day  . ferrous sulfate 325 (65 FE) MG tablet Take 325 mg by mouth daily with breakfast.  . Fluticasone-Salmeterol (ADVAIR) 250-50 MCG/DOSE AEPB Inhale 1 puff 2 (two) times daily into the lungs. Rinse mouth after use  . furosemide (LASIX) 20 MG tablet Take 20 mg by mouth daily. Hold for SBP < 110  . ipratropium (ATROVENT) 0.02 % nebulizer solution Take 0.5 mg by nebulization every 6 (six) hours as needed for wheezing or shortness of breath.  . levothyroxine (SYNTHROID, LEVOTHROID) 25 MCG tablet Take 25 mcg by mouth daily before breakfast.  . mirtazapine (REMERON) 15 MG tablet Take 7.5 mg by mouth at bedtime. Take 1/2 tablet  . OXYGEN Inhale 2 L/min into the lungs as needed (to keep O2 SATS >90%).   . polyethylene glycol (MIRALAX / GLYCOLAX) packet Take 17 g by mouth daily.  Marland Kitchen  raloxifene (EVISTA) 60 MG tablet Take 1 tablet by mouth  daily to treat osteoporosis  . zinc oxide 20 % ointment Apply 1 application topically 2 (two) times daily. Also apply every Shift - PRN  Apply to buttocks as needed every shift after incontinent episodes  . [DISCONTINUED] escitalopram (LEXAPRO) 10 MG tablet Take 20 mg by mouth daily. Take 2 tablets  to equal 20 mg.  . [DISCONTINUED] furosemide (LASIX) 40 MG tablet Take 40 mg by mouth daily. If systolic B/P is <427 hold  . [DISCONTINUED] mirtazapine (REMERON) 7.5 MG tablet Take 7.5 mg by mouth daily.   No facility-administered encounter medications on file as of 07/10/2018.    ROS was  provided with assistance of staff Review of Systems  Constitutional: Positive for activity change, appetite change and fatigue. Negative for chills, diaphoresis and fever.  HENT: Positive for hearing loss. Negative for congestion and voice change.   Respiratory: Positive for cough. Negative for shortness of breath and wheezing.        Chronic hacking cough  Cardiovascular: Positive for leg swelling. Negative for chest pain and palpitations.  Gastrointestinal: Negative for abdominal distention, abdominal pain, constipation, diarrhea, nausea and vomiting.  Genitourinary: Negative for difficulty urinating, dysuria and urgency.  Musculoskeletal: Positive for arthralgias, gait problem and joint swelling. Negative for neck stiffness.  Skin: Positive for color change.       Bruise, swelling, unable to move, pain in the right shoulder.   Neurological: Positive for dizziness. Negative for facial asymmetry, speech difficulty, weakness, light-headedness and headaches.       Memory lapses.  Psychiatric/Behavioral: Negative for agitation, behavioral problems, hallucinations and sleep disturbance. The patient is not nervous/anxious.     Immunization History  Administered Date(s) Administered  . Influenza Whole 12/27/2011, 11/05/2012  . Influenza-Unspecified 11/18/2013, 11/03/2014, 11/16/2015, 11/13/2016, 11/17/2017  . PPD Test 04/04/2008, 11/23/2014  . Pneumococcal Conjugate-13 09/30/2016  . Pneumococcal Polysaccharide-23 02/05/2008, 06/03/2008  . Td 02/05/2008, 06/03/2008   Pertinent  Health Maintenance Due  Topic Date Due  . INFLUENZA VACCINE  09/05/2018  . DEXA SCAN  Completed  . PNA vac Low Risk Adult  Completed   Fall Risk  10/09/2017 09/23/2016 08/27/2016 05/30/2015 05/16/2015  Falls in the past year? Yes Yes Yes Yes No  Comment - - [facility] Telephone Survey: data to providers prior to load - -  Number falls in past yr: 2 or more 1 1 1  -  Comment - - [facility] Telephone Survey Actual Response = 1 - -   Injury with Fall? Yes No No Yes -  Comment broke R wrist - - - -  Risk Factor Category  - - - - -  Risk for fall due to : - - - History of fall(s);Impaired balance/gait;Impaired mobility -  Follow up - - - Falls evaluation completed;Education provided -   Functional Status Survey:    Vitals:   07/10/18 0847  BP: (!) 130/58  Pulse: (!) 50  Resp: 20  Temp: (!) 97 F (36.1 C)  TempSrc: Oral  SpO2: 94%  Weight: 137 lb 8 oz (62.4 kg)  Height: 4\' 8"  (1.422 m)   Body mass index is 30.83 kg/m. Physical Exam Vitals signs and nursing note reviewed.  Constitutional:      General: She is in acute distress.     Appearance: Normal appearance. She is not ill-appearing, toxic-appearing or diaphoretic.  HENT:     Head: Normocephalic and atraumatic.     Nose: Nose normal.  Mouth/Throat:     Mouth: Mucous membranes are moist.  Eyes:     Extraocular Movements: Extraocular movements intact.     Conjunctiva/sclera: Conjunctivae normal.     Pupils: Pupils are equal, round, and reactive to light.  Neck:     Musculoskeletal: Normal range of motion and neck supple. No neck rigidity or muscular tenderness.  Cardiovascular:     Rate and Rhythm: Normal rate and regular rhythm.     Heart sounds: No murmur.  Pulmonary:     Effort: Pulmonary effort is normal.     Breath sounds: No wheezing, rhonchi or rales.  Abdominal:     General: Bowel sounds are normal. There is no distension.     Palpations: Abdomen is soft.     Tenderness: There is no abdominal tenderness. There is no right CVA tenderness, left CVA tenderness, guarding or rebound.  Musculoskeletal:        General: Swelling, tenderness and signs of injury present.     Right lower leg: Edema present.     Left lower leg: Edema present.     Comments: Bruise, swelling, unable to move, pain in the right shoulder. Chronic trace edema BLE. Ambulates with walker for a short distance, w/c to go further.   Skin:    General: Skin is dry.      Findings: Bruising present.     Comments: Right shoulder bruise, swelling, pain  Neurological:     General: No focal deficit present.     Mental Status: She is alert. Mental status is at baseline.     Cranial Nerves: No cranial nerve deficit.     Motor: No weakness.     Coordination: Coordination normal.     Gait: Gait abnormal.     Comments: Oriented to person and place.   Psychiatric:        Mood and Affect: Mood normal.        Behavior: Behavior normal.        Thought Content: Thought content normal.     Labs reviewed: Recent Labs    09/19/17 0655 09/20/17 0330 09/21/17 0235  09/22/17 09/29/17 03/19/18 06/17/18  NA 142 143 143   < > 142 143 142 143  K 3.9 4.3 4.4   < > 4.9 4.5 4.3 4.0  CL 111 112* 112*  --   --   --   --   --   CO2 25 25 26   --   --   --   --   --   GLUCOSE 108* 129* 112*  --   --   --   --   --   BUN 23 23 22    < > 21 19 27* 27*  CREATININE 0.98 0.81 0.80   < > 0.76 0.96 1.1 1.0  CALCIUM 8.1* 8.1* 8.4*  --  8.6 9.0  --   --   MG 2.3 2.3 2.2  --   --   --   --   --    < > = values in this interval not displayed.   Recent Labs    09/29/17 03/19/18 06/17/18  AST 15 14 14   ALT 12 11 11   ALKPHOS 82  --  45  BILITOT 0.9  --   --   PROT 6.2  --   --   ALBUMIN 3.4  --   --    Recent Labs    09/15/17 1907 09/16/17 0407  09/21/17 0235  09/22/17 09/29/17 03/19/18 06/17/18  WBC 16.6* 11.2*   < > 10.9*  --  10.4 9.1 6.0 5.4  NEUTROABS 14.0* 8.7*  --   --   --   --   --   --   --   HGB 12.7 11.4*   < > 8.9*   < > 9.5 10.4 12.3 12.9  HCT 40.3 37.2   < > 28.5*   < > 28.3 31 38 39  MCV 93.3 94.7   < > 96.3  --  91.3 93.1  --   --   PLT 252 251   < > 263  --   --   --  245 224   < > = values in this interval not displayed.   Lab Results  Component Value Date   TSH 1.62 05/11/2018   Lab Results  Component Value Date   HGBA1C 6.0 06/17/2018   Lab Results  Component Value Date   CHOL 211 (A) 02/14/2014   HDL 47 02/14/2014   LDLCALC 133 02/14/2014    TRIG 155 02/14/2014     Assessment/Plan Fracture, humerus closed 07/09/18 sustained from a mechanical fall at her room [facility]. ED eval and treat as indicated.   Osteoarthritis Multiple sites, continue Tylenol 1000mg  tid.   Abnormality of gait Ambulates with walker for a short distance, w/c to go further.   Cognitive impairment Lack of safety awareness, continue SN[facility] for safety and care assistance.   Constipation Stable, continue MiraLax qd.   Depression, recurrent ([place]) Her mood is stable, continue Mirtazapine 7.5mg  qd, Lexapro 20mg  qd.   Edema Stable, minimal swelling BLE, continue Furosemide 20mg  qd.   Iron deficiency anemia Stable, continue Fe supplement.   Major depression, chronic Her mood is stable, continue Lexapro 20mg  qd, Mirtazapine 7.5mg  qd.   COPD (chronic obstructive pulmonary disease) (HCC) Stable, continue Advair bid  Hypothyroid Stable, continue Levothyroxine 38mcg qd.      Family/ staff Communication: plan of care reviewed with the patient, the patient's HPOA, and charge nurse.   Labs/tests ordered:  X-ray right shoulder.   Time spend 35 minutes.

## 2018-07-10 NOTE — Assessment & Plan Note (Signed)
Her mood is stable, continue Lexapro 20mg  qd, Mirtazapine 7.5mg  qd.

## 2018-07-10 NOTE — Assessment & Plan Note (Signed)
Stable, continue MiraLax qd.  

## 2018-07-10 NOTE — ED Notes (Signed)
[facility] called for pick up. 

## 2018-07-10 NOTE — Assessment & Plan Note (Signed)
Stable, minimal swelling BLE, continue Furosemide 20mg  qd.

## 2018-07-10 NOTE — Assessment & Plan Note (Signed)
Stable, continue Fe supplement.

## 2018-07-10 NOTE — Assessment & Plan Note (Signed)
Ambulates with walker for a short distance, w/c to go further.

## 2018-07-10 NOTE — Assessment & Plan Note (Signed)
Lack of safety awareness, continue [facility] for safety and care assistance.

## 2018-07-10 NOTE — Assessment & Plan Note (Signed)
Multiple sites, continue Tylenol 1000mg  tid.

## 2018-07-10 NOTE — Assessment & Plan Note (Signed)
Stable, continue Advair bid

## 2018-07-10 NOTE — Assessment & Plan Note (Signed)
Her mood is stable, continue Mirtazapine 7.5mg  qd, Lexapro 20mg  qd.

## 2018-07-10 NOTE — Assessment & Plan Note (Signed)
Stable, continue Levothyroxine 25mcg qd.  

## 2018-07-14 ENCOUNTER — Encounter: Payer: Self-pay | Admitting: Nurse Practitioner

## 2018-07-14 ENCOUNTER — Non-Acute Institutional Stay (SKILLED_NURSING_FACILITY): Payer: Medicare Other | Admitting: Nurse Practitioner

## 2018-07-14 ENCOUNTER — Other Ambulatory Visit: Payer: Self-pay | Admitting: Nurse Practitioner

## 2018-07-14 ENCOUNTER — Other Ambulatory Visit: Payer: Self-pay | Admitting: *Deleted

## 2018-07-14 DIAGNOSIS — S42291A Other displaced fracture of upper end of right humerus, initial encounter for closed fracture: Secondary | ICD-10-CM | POA: Diagnosis not present

## 2018-07-14 DIAGNOSIS — F339 Major depressive disorder, recurrent, unspecified: Secondary | ICD-10-CM | POA: Diagnosis not present

## 2018-07-14 DIAGNOSIS — K219 Gastro-esophageal reflux disease without esophagitis: Secondary | ICD-10-CM | POA: Diagnosis not present

## 2018-07-14 DIAGNOSIS — K5900 Constipation, unspecified: Secondary | ICD-10-CM | POA: Diagnosis not present

## 2018-07-14 MED ORDER — HYDROCODONE-ACETAMINOPHEN 5-325 MG PO TABS: 0.5000 | ORAL_TABLET | Freq: Three times a day (TID) | ORAL | 0 refills | Status: DC

## 2018-07-14 MED ORDER — HYDROCODONE-ACETAMINOPHEN 5-325 MG PO TABS: ORAL_TABLET | ORAL | 0 refills | Status: DC

## 2018-07-14 NOTE — Telephone Encounter (Signed)
Received fax order request from [place] and sent to [facility] for approval.

## 2018-07-14 NOTE — Assessment & Plan Note (Signed)
Continue sling to support the RUE, f/u Ortho 07/15/18, continue adding Norco 5/325mg  1/2 tab 6am, 2pm, 8pm, continue Tylenol 650mg  q6h prn.

## 2018-07-14 NOTE — Assessment & Plan Note (Signed)
Not pattern of falling. Lack of safety awareness and unsteady gait are contributory to the fall. Close sueprvision

## 2018-07-14 NOTE — Assessment & Plan Note (Signed)
Stable, continue Famotidine 20mg qd.  

## 2018-07-14 NOTE — Assessment & Plan Note (Signed)
Stable, continue MiraLax qd.  

## 2018-07-14 NOTE — Progress Notes (Signed)
Location:   SNF [place] Room Number: 25/A Place of Service:  SNF (31) Provider: Lennie Odor Argel Pablo NP  Eriona Kinchen X, NP  Patient Care Team: Chyane Greer X, NP as PCP - General (Internal Medicine) Newton Pigg, MD as Consulting Physician (Obstetrics and Gynecology) Monna Fam, MD as Consulting Physician (Ophthalmology) Melida Quitter, MD as Consulting Physician (Otolaryngology) Myrlene Broker, MD as Consulting Physician (Urology) Rogers, [facility] Virgie Dad, MD as Consulting Physician (Internal Medicine)  Extended Emergency Contact Information Primary Emergency Contact: Singleton,[name] Address: [place], [place] Phone: [phone] Work Phone: [phone] Mobile Phone: [phone] Relation: Son Secondary Emergency Contact: Teofilo [place] of [place] Phone: [phone] Work Phone: [phone] Relation: Friend  Code Status:  DNR Goals of care: Advanced Directive information Advanced Directives 07/14/2018  Does Patient Have a Medical Advance Directive? Yes  Type of Paramedic of [place];Living will;Out of facility DNR (pink MOST or yellow form)  Does patient want to make changes to medical advance directive? No - Patient declined  Copy of [place] in Chart? Yes - validated most recent copy scanned in chart (See row information)  Pre-existing out of facility DNR order (yellow form or pink MOST form) Pink MOST form placed in chart (order not valid for inpatient use);Yellow form placed in chart (order not valid for inpatient use)     Chief Complaint  Patient presents with  . Acute Visit    Right shoulder pain    HPI:  Pt is a 85 y.o. female seen today for an acute visit for right shoulder pain, acute comminuted right humeral neck and greater tuberosity fracture, s/p ED eval 07/10/18, sling, pending f/u Ortho 07/15/18, on Tylenol 1000mg  tid-not  adequate for pain control. Hx of constipation, stable, on MiraLax qd. GERD, stable on Famotidine 20mg  qd.    Past Medical History:  Diagnosis Date  . Asthma   . Carpal tunnel syndrome 03/07/2009  . Disturbance of skin sensation 02/21/2009  . Diverticulosis of colon (without mention of hemorrhage) 02/16/2009  . Fracture of upper end of left humerus 11/18/14  . 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 Hypothyroid   . Osteoarthrosis, unspecified whether generalized or localized, unspecified site 02/16/2009  . Other abnormal blood chemistry 06/19/2010   hyperglycemia  . Other and unspecified hyperlipidemia 10/24/2009  . Other atopic dermatitis and related conditions 02/16/2009  . Otosclerosis, unspecified 02/22/2003  . Pain in limb 12/05/2009  . Senile osteoporosis 06/16/2010  . Sensorineural hearing loss, unspecified 02/21/2009  . Syncope and collapse 05/30/2009  . Unspecified nasal polyp 02/21/2009  . Unspecified urinary incontinence 02/21/2009   Past Surgical History:  Procedure Laterality Date  . ABDOMINAL HYSTERECTOMY  1999   Dr. Collier Bullock  . BREAST BIOPSY  07/16/1990   benign left breast needle biopsy  Dr. Margot Chimes  . CARPAL TUNNEL RELEASE  04/07/2009   right Dr. Daylene Katayama  . CATARACT EXTRACTION W/ INTRAOCULAR LENS  IMPLANT, BILATERAL Bilateral   . INTRAMEDULLARY (IM) NAIL INTERTROCHANTERIC Right 09/17/2017   Procedure: INTRAMEDULLARY (IM) NAIL INTERTROCHANTRIC HIP;  Surgeon: Nicholes Stairs, MD;  Location: [place];  Service: Orthopedics;  Laterality: Right;  . OPEN REDUCTION INTERNAL FIXATION (ORIF) DISTAL RADIAL FRACTURE Right 09/17/2017   Procedure: OPEN REDUCTION INTERNAL FIXATION (ORIF) DISTAL RADIAL FRACTURE;  Surgeon: Iran Planas, MD;  Location: [place];  Service: Orthopedics;  Laterality: Right;  .  TRIGGER FINGER RELEASE Left 08/09/2014   Procedure: RELEASE TRIGGER FINGER/A-1 PULLEY LEFT MIDDLE FINGER;  Surgeon: Daryll Brod, MD;  Location: [place];  Service: Orthopedics;   Laterality: Left;  REGIONAL/FAB    Allergies  Allergen Reactions  . Sulfa Antibiotics Itching and Rash  . Amoxicillin Other (See Comments)    Per [facility] 09/15/17 Has patient had a PCN reaction causing immediate rash, facial/tongue/throat swelling, SOB or lightheadedness with hypotension: Unknown Has patient had a PCN reaction causing severe rash involving mucus membranes or skin necrosis: Unknown Has patient had a PCN reaction that required hospitalization: Unknown Has patient had a PCN reaction occurring within the last 10 years: Unknown If all of the above answers are "NO", then may proceed with Cephalosporin use.  . Avelox [Moxifloxacin Hcl In Nacl] Itching  . Doxycycline Other (See Comments)    Unknown reaction  . Hista-Tabs [Triprolidine-Pse] Other (See Comments)    Passed out  . Pyrilamine-Phenylephrine Other (See Comments)    Unknown reaction - per [facility] 09/15/17  . Septra [Sulfamethoxazole-Trimethoprim] Other (See Comments)    Unknown reaction    Allergies as of 07/14/2018      Reactions   Sulfa Antibiotics Itching, Rash   Amoxicillin Other (See Comments)   Per [facility] 09/15/17 Has patient had a PCN reaction causing immediate rash, facial/tongue/throat swelling, SOB or lightheadedness with hypotension: Unknown Has patient had a PCN reaction causing severe rash involving mucus membranes or skin necrosis: Unknown Has patient had a PCN reaction that required hospitalization: Unknown Has patient had a PCN reaction occurring within the last 10 years: Unknown If all of the above answers are "NO", then may proceed with Cephalosporin use.   Avelox [moxifloxacin Hcl In Nacl] Itching   Doxycycline Other (See Comments)   Unknown reaction   Hista-tabs [triprolidine-pse] Other (See Comments)   Passed out   Pyrilamine-phenylephrine Other (See Comments)   Unknown reaction - per [facility] 09/15/17   Septra [sulfamethoxazole-trimethoprim] Other (See Comments)   Unknown reaction      Medication List        Accurate as of July 14, 2018  2:13 PM. If you have any questions, ask your nurse or doctor.        aspirin EC 81 MG tablet Take 81 mg by mouth daily. On Tuesday and Saturday   escitalopram 20 MG tablet Commonly known as:  LEXAPRO Take 20 mg by mouth daily.   famotidine 20 MG tablet Commonly known as:  PEPCID Take 20 mg by mouth daily.   feeding supplement Liqd Take 237 mLs by mouth 2 (two) times daily. Add ice cream to boost Plus twice a day   ferrous sulfate 325 (65 FE) MG tablet Take 325 mg by mouth daily with breakfast.   Fluticasone-Salmeterol 250-50 MCG/DOSE Aepb Commonly known as:  ADVAIR Inhale 1 puff 2 (two) times daily into the lungs. Rinse mouth after use   furosemide 20 MG tablet Commonly known as:  LASIX Take 20 mg by mouth daily. Hold for SBP < 110   ipratropium 0.02 % nebulizer solution Commonly known as:  ATROVENT Take 0.5 mg by nebulization every 6 (six) hours as needed for wheezing or shortness of breath.   levothyroxine 25 MCG tablet Commonly known as:  SYNTHROID Take 25 mcg by mouth daily before breakfast.   mirtazapine 15 MG tablet Commonly known as:  REMERON Take 7.5 mg by mouth at bedtime.   OXYGEN Inhale 2 L/min into the lungs as needed (to keep O2 SATS >  90%).   polyethylene glycol 17 g packet Commonly known as:  MIRALAX / GLYCOLAX Take 17 g by mouth daily.   raloxifene 60 MG tablet Commonly known as:  EVISTA Take 1 tablet by mouth  daily to treat osteoporosis What changed:  See the new instructions.   TYLENOL 500 MG tablet Generic drug:  acetaminophen Take 1,000 mg by mouth 3 (three) times daily.   Ventolin HFA 108 (90 Base) MCG/ACT inhaler Generic drug:  albuterol Inhale 2 puffs into the lungs every 6 (six) hours as needed for shortness of breath (dyspnea).   zinc oxide 20 % ointment Apply 1 application topically 2 (two) times daily. Also apply every Shift - PRN  Apply to buttocks as needed every shift after incontinent  episodes      ROS was provided with assistance of staff Review of Systems  Constitutional: Positive for activity change, appetite change and fatigue. Negative for chills, diaphoresis and fever.  HENT: Positive for hearing loss. Negative for congestion and voice change.   Respiratory: Positive for cough. Negative for shortness of breath and wheezing.        Chronic hacking cough.   Cardiovascular: Negative for chest pain, palpitations and leg swelling.  Gastrointestinal: Negative for abdominal distention, constipation, diarrhea, nausea and vomiting.  Genitourinary: Negative for difficulty urinating, dysuria and urgency.  Musculoskeletal: Positive for arthralgias and gait problem.       Right shoulder swelling and bruise  Skin: Positive for color change.       Ecchymoses right shoulder.   Neurological: Negative for dizziness, speech difficulty, weakness and headaches.       Memory lapses.   Psychiatric/Behavioral: Negative for agitation, behavioral problems, hallucinations and sleep disturbance. The patient is not nervous/anxious.     Immunization History  Administered Date(s) Administered  . Influenza Whole 12/27/2011, 11/05/2012  . Influenza-Unspecified 11/18/2013, 11/03/2014, 11/16/2015, 11/13/2016, 11/17/2017  . PPD Test 04/04/2008, 11/23/2014  . Pneumococcal Conjugate-13 09/30/2016  . Pneumococcal Polysaccharide-23 02/05/2008, 06/03/2008  . Td 02/05/2008, 06/03/2008   Pertinent  Health Maintenance Due  Topic Date Due  . INFLUENZA VACCINE  09/05/2018  . DEXA SCAN  Completed  . PNA vac Low Risk Adult  Completed   Fall Risk  10/09/2017 09/23/2016 08/27/2016 05/30/2015 05/16/2015  Falls in the past year? Yes Yes Yes Yes No  Comment - - Emmi Telephone Survey: data to providers prior to load - -  Number falls in past yr: 2 or more 1 1 1  -  Comment - - Emmi Telephone Survey Actual Response = 1 - -  Injury with Fall? Yes No No Yes -  Comment broke R wrist - - - -  Risk Factor  Category  - - - - -  Risk for fall due to : - - - History of fall(s);Impaired balance/gait;Impaired mobility -  Follow up - - - Falls evaluation completed;Education provided -   Functional Status Survey:    Vitals:   07/14/18 1315  BP: 138/76  Pulse: 71  Resp: 20  Temp: (!) 96.9 F (36.1 C)  SpO2: 95%  Weight: 137 lb 8 oz (62.4 kg)  Height: 4\' 8"  (1.422 m)   Body mass index is 30.83 kg/m. Physical Exam Vitals signs and nursing note reviewed.  Constitutional:      General: She is not in acute distress.    Appearance: Normal appearance. She is not ill-appearing, toxic-appearing or diaphoretic.  HENT:     Head: Normocephalic and atraumatic.     Nose: Nose normal.  Mouth/Throat:     Mouth: Mucous membranes are moist.  Eyes:     Extraocular Movements: Extraocular movements intact.     Conjunctiva/sclera: Conjunctivae normal.     Pupils: Pupils are equal, round, and reactive to light.  Neck:     Musculoskeletal: Normal range of motion and neck supple.  Cardiovascular:     Rate and Rhythm: Normal rate and regular rhythm.     Heart sounds: No murmur.  Pulmonary:     Effort: Pulmonary effort is normal.     Breath sounds: No wheezing, rhonchi or rales.  Abdominal:     General: Bowel sounds are normal. There is no distension.     Palpations: Abdomen is soft.     Tenderness: There is no abdominal tenderness. There is no right CVA tenderness, left CVA tenderness, guarding or rebound.  Musculoskeletal:        General: Swelling, tenderness and signs of injury present.     Right lower leg: No edema.     Left lower leg: No edema.     Comments: Right shoulder swelling, bruise, slightly warmth, pain when palpated and attempt of moving.   Skin:    General: Skin is warm and dry.     Findings: Bruising present.     Comments: Entire right shoulder region ecchymoses.   Neurological:     General: No focal deficit present.     Mental Status: She is alert. Mental status is at  baseline.     Cranial Nerves: No cranial nerve deficit.     Motor: No weakness.     Coordination: Coordination normal.     Gait: Gait abnormal.     Deep Tendon Reflexes: Reflexes normal.     Comments: Oriented to person and place.   Psychiatric:        Mood and Affect: Mood normal.        Behavior: Behavior normal.     Labs reviewed: Recent Labs    09/19/17 0655 09/20/17 0330 09/21/17 0235  09/22/17 09/29/17 03/19/18 06/17/18  NA 142 143 143   < > 142 143 142 143  K 3.9 4.3 4.4   < > 4.9 4.5 4.3 4.0  CL 111 112* 112*  --   --   --   --   --   CO2 25 25 26   --   --   --   --   --   GLUCOSE 108* 129* 112*  --   --   --   --   --   BUN 23 23 22    < > 21 19 27* 27*  CREATININE 0.98 0.81 0.80   < > 0.76 0.96 1.1 1.0  CALCIUM 8.1* 8.1* 8.4*  --  8.6 9.0  --   --   MG 2.3 2.3 2.2  --   --   --   --   --    < > = values in this interval not displayed.   Recent Labs    09/29/17 03/19/18 06/17/18  AST 15 14 14   ALT 12 11 11   ALKPHOS 82  --  45  BILITOT 0.9  --   --   PROT 6.2  --   --   ALBUMIN 3.4  --   --    Recent Labs    09/15/17 1907 09/16/17 0407  09/21/17 0235  09/22/17 09/29/17 03/19/18 06/17/18  WBC 16.6* 11.2*   < > 10.9*  --  10.4 9.1 6.0 5.4  NEUTROABS  14.0* 8.7*  --   --   --   --   --   --   --   HGB 12.7 11.4*   < > 8.9*   < > 9.5 10.4 12.3 12.9  HCT 40.3 37.2   < > 28.5*   < > 28.3 31 38 39  MCV 93.3 94.7   < > 96.3  --  91.3 93.1  --   --   PLT 252 251   < > 263  --   --   --  245 224   < > = values in this interval not displayed.   Lab Results  Component Value Date   TSH 1.62 05/11/2018   Lab Results  Component Value Date   HGBA1C 6.0 06/17/2018   Lab Results  Component Value Date   CHOL 211 (A) 02/14/2014   HDL 47 02/14/2014   LDLCALC 133 02/14/2014   TRIG 155 02/14/2014    Significant Diagnostic Results in last 30 days:  Dg Shoulder Right  Result Date: 07/10/2018 CLINICAL DATA:  Right shoulder pain following a fall. EXAM: RIGHT SHOULDER  - 2+ VIEW COMPARISON:  09/15/2017. FINDINGS: Interval comminuted fracture of the right humeral neck and greater tuberosity. Medial and posterior displacement of the distal fragment with mild lateral angulation of the distal fragment. IMPRESSION: Comminuted right humeral neck and greater tuberosity fracture. Electronically Signed   By: Claudie Revering M.D.   On: 07/10/2018 13:27    Assessment/Plan Fracture, humerus closed Continue sling to support the RUE, f/u Ortho 07/15/18, continue adding Norco 5/325mg  1/2 tab 6am, 2pm, 8pm, continue Tylenol 650mg  q6h prn.   GERD (gastroesophageal reflux disease) Stable, continue Famotidine 20mg  qd.   Constipation Stable, continue MiraLax qd.     Family/ staff Communication: plan of care reviewed with the patient and charge nurse.   Labs/tests ordered:  None  Time spend 25 minutes

## 2018-07-14 NOTE — Assessment & Plan Note (Signed)
Her mood is stable, continue Mirtazapine 7.5mg  qd, Lexapro 20mg  qd.

## 2018-07-15 DIAGNOSIS — S42291A Other displaced fracture of upper end of right humerus, initial encounter for closed fracture: Secondary | ICD-10-CM | POA: Diagnosis not present

## 2018-07-16 DIAGNOSIS — Z9181 History of falling: Secondary | ICD-10-CM | POA: Diagnosis not present

## 2018-07-16 DIAGNOSIS — R293 Abnormal posture: Secondary | ICD-10-CM | POA: Diagnosis not present

## 2018-07-16 DIAGNOSIS — M6281 Muscle weakness (generalized): Secondary | ICD-10-CM | POA: Diagnosis not present

## 2018-07-16 DIAGNOSIS — R296 Repeated falls: Secondary | ICD-10-CM | POA: Diagnosis not present

## 2018-07-16 DIAGNOSIS — M818 Other osteoporosis without current pathological fracture: Secondary | ICD-10-CM | POA: Diagnosis not present

## 2018-07-16 DIAGNOSIS — N3281 Overactive bladder: Secondary | ICD-10-CM | POA: Diagnosis not present

## 2018-07-16 DIAGNOSIS — R55 Syncope and collapse: Secondary | ICD-10-CM | POA: Diagnosis not present

## 2018-07-17 DIAGNOSIS — R296 Repeated falls: Secondary | ICD-10-CM | POA: Diagnosis not present

## 2018-07-17 DIAGNOSIS — Z9181 History of falling: Secondary | ICD-10-CM | POA: Diagnosis not present

## 2018-07-17 DIAGNOSIS — R293 Abnormal posture: Secondary | ICD-10-CM | POA: Diagnosis not present

## 2018-07-17 DIAGNOSIS — M818 Other osteoporosis without current pathological fracture: Secondary | ICD-10-CM | POA: Diagnosis not present

## 2018-07-17 DIAGNOSIS — M6281 Muscle weakness (generalized): Secondary | ICD-10-CM | POA: Diagnosis not present

## 2018-07-17 DIAGNOSIS — N3281 Overactive bladder: Secondary | ICD-10-CM | POA: Diagnosis not present

## 2018-07-20 DIAGNOSIS — Z9181 History of falling: Secondary | ICD-10-CM | POA: Diagnosis not present

## 2018-07-20 DIAGNOSIS — R296 Repeated falls: Secondary | ICD-10-CM | POA: Diagnosis not present

## 2018-07-20 DIAGNOSIS — R293 Abnormal posture: Secondary | ICD-10-CM | POA: Diagnosis not present

## 2018-07-20 DIAGNOSIS — M818 Other osteoporosis without current pathological fracture: Secondary | ICD-10-CM | POA: Diagnosis not present

## 2018-07-20 DIAGNOSIS — M6281 Muscle weakness (generalized): Secondary | ICD-10-CM | POA: Diagnosis not present

## 2018-07-20 DIAGNOSIS — N3281 Overactive bladder: Secondary | ICD-10-CM | POA: Diagnosis not present

## 2018-07-21 DIAGNOSIS — N3281 Overactive bladder: Secondary | ICD-10-CM | POA: Diagnosis not present

## 2018-07-21 DIAGNOSIS — M6281 Muscle weakness (generalized): Secondary | ICD-10-CM | POA: Diagnosis not present

## 2018-07-21 DIAGNOSIS — R293 Abnormal posture: Secondary | ICD-10-CM | POA: Diagnosis not present

## 2018-07-21 DIAGNOSIS — Z9181 History of falling: Secondary | ICD-10-CM | POA: Diagnosis not present

## 2018-07-21 DIAGNOSIS — M818 Other osteoporosis without current pathological fracture: Secondary | ICD-10-CM | POA: Diagnosis not present

## 2018-07-21 DIAGNOSIS — R296 Repeated falls: Secondary | ICD-10-CM | POA: Diagnosis not present

## 2018-07-22 DIAGNOSIS — Z9181 History of falling: Secondary | ICD-10-CM | POA: Diagnosis not present

## 2018-07-22 DIAGNOSIS — Z20828 Contact with and (suspected) exposure to other viral communicable diseases: Secondary | ICD-10-CM | POA: Diagnosis not present

## 2018-07-22 DIAGNOSIS — R293 Abnormal posture: Secondary | ICD-10-CM | POA: Diagnosis not present

## 2018-07-22 DIAGNOSIS — M818 Other osteoporosis without current pathological fracture: Secondary | ICD-10-CM | POA: Diagnosis not present

## 2018-07-22 DIAGNOSIS — M6281 Muscle weakness (generalized): Secondary | ICD-10-CM | POA: Diagnosis not present

## 2018-07-22 DIAGNOSIS — R296 Repeated falls: Secondary | ICD-10-CM | POA: Diagnosis not present

## 2018-07-22 DIAGNOSIS — N3281 Overactive bladder: Secondary | ICD-10-CM | POA: Diagnosis not present

## 2018-07-23 DIAGNOSIS — R296 Repeated falls: Secondary | ICD-10-CM | POA: Diagnosis not present

## 2018-07-23 DIAGNOSIS — M818 Other osteoporosis without current pathological fracture: Secondary | ICD-10-CM | POA: Diagnosis not present

## 2018-07-23 DIAGNOSIS — N3281 Overactive bladder: Secondary | ICD-10-CM | POA: Diagnosis not present

## 2018-07-23 DIAGNOSIS — R293 Abnormal posture: Secondary | ICD-10-CM | POA: Diagnosis not present

## 2018-07-23 DIAGNOSIS — M6281 Muscle weakness (generalized): Secondary | ICD-10-CM | POA: Diagnosis not present

## 2018-07-23 DIAGNOSIS — Z9181 History of falling: Secondary | ICD-10-CM | POA: Diagnosis not present

## 2018-07-24 DIAGNOSIS — R296 Repeated falls: Secondary | ICD-10-CM | POA: Diagnosis not present

## 2018-07-24 DIAGNOSIS — N3281 Overactive bladder: Secondary | ICD-10-CM | POA: Diagnosis not present

## 2018-07-24 DIAGNOSIS — M818 Other osteoporosis without current pathological fracture: Secondary | ICD-10-CM | POA: Diagnosis not present

## 2018-07-24 DIAGNOSIS — R293 Abnormal posture: Secondary | ICD-10-CM | POA: Diagnosis not present

## 2018-07-24 DIAGNOSIS — Z9181 History of falling: Secondary | ICD-10-CM | POA: Diagnosis not present

## 2018-07-24 DIAGNOSIS — M6281 Muscle weakness (generalized): Secondary | ICD-10-CM | POA: Diagnosis not present

## 2018-07-27 DIAGNOSIS — M6281 Muscle weakness (generalized): Secondary | ICD-10-CM | POA: Diagnosis not present

## 2018-07-27 DIAGNOSIS — R296 Repeated falls: Secondary | ICD-10-CM | POA: Diagnosis not present

## 2018-07-27 DIAGNOSIS — Z9181 History of falling: Secondary | ICD-10-CM | POA: Diagnosis not present

## 2018-07-27 DIAGNOSIS — R293 Abnormal posture: Secondary | ICD-10-CM | POA: Diagnosis not present

## 2018-07-27 DIAGNOSIS — N3281 Overactive bladder: Secondary | ICD-10-CM | POA: Diagnosis not present

## 2018-07-27 DIAGNOSIS — M818 Other osteoporosis without current pathological fracture: Secondary | ICD-10-CM | POA: Diagnosis not present

## 2018-07-28 DIAGNOSIS — M6281 Muscle weakness (generalized): Secondary | ICD-10-CM | POA: Diagnosis not present

## 2018-07-28 DIAGNOSIS — M818 Other osteoporosis without current pathological fracture: Secondary | ICD-10-CM | POA: Diagnosis not present

## 2018-07-28 DIAGNOSIS — Z9181 History of falling: Secondary | ICD-10-CM | POA: Diagnosis not present

## 2018-07-28 DIAGNOSIS — R296 Repeated falls: Secondary | ICD-10-CM | POA: Diagnosis not present

## 2018-07-28 DIAGNOSIS — N3281 Overactive bladder: Secondary | ICD-10-CM | POA: Diagnosis not present

## 2018-07-28 DIAGNOSIS — R293 Abnormal posture: Secondary | ICD-10-CM | POA: Diagnosis not present

## 2018-07-29 DIAGNOSIS — R293 Abnormal posture: Secondary | ICD-10-CM | POA: Diagnosis not present

## 2018-07-29 DIAGNOSIS — M818 Other osteoporosis without current pathological fracture: Secondary | ICD-10-CM | POA: Diagnosis not present

## 2018-07-29 DIAGNOSIS — N3281 Overactive bladder: Secondary | ICD-10-CM | POA: Diagnosis not present

## 2018-07-29 DIAGNOSIS — Z9181 History of falling: Secondary | ICD-10-CM | POA: Diagnosis not present

## 2018-07-29 DIAGNOSIS — M6281 Muscle weakness (generalized): Secondary | ICD-10-CM | POA: Diagnosis not present

## 2018-07-29 DIAGNOSIS — R296 Repeated falls: Secondary | ICD-10-CM | POA: Diagnosis not present

## 2018-07-30 DIAGNOSIS — R293 Abnormal posture: Secondary | ICD-10-CM | POA: Diagnosis not present

## 2018-07-30 DIAGNOSIS — N3281 Overactive bladder: Secondary | ICD-10-CM | POA: Diagnosis not present

## 2018-07-30 DIAGNOSIS — M6281 Muscle weakness (generalized): Secondary | ICD-10-CM | POA: Diagnosis not present

## 2018-07-30 DIAGNOSIS — M818 Other osteoporosis without current pathological fracture: Secondary | ICD-10-CM | POA: Diagnosis not present

## 2018-07-30 DIAGNOSIS — R296 Repeated falls: Secondary | ICD-10-CM | POA: Diagnosis not present

## 2018-07-30 DIAGNOSIS — Z9181 History of falling: Secondary | ICD-10-CM | POA: Diagnosis not present

## 2018-07-31 DIAGNOSIS — M6281 Muscle weakness (generalized): Secondary | ICD-10-CM | POA: Diagnosis not present

## 2018-07-31 DIAGNOSIS — R296 Repeated falls: Secondary | ICD-10-CM | POA: Diagnosis not present

## 2018-07-31 DIAGNOSIS — M818 Other osteoporosis without current pathological fracture: Secondary | ICD-10-CM | POA: Diagnosis not present

## 2018-07-31 DIAGNOSIS — Z9181 History of falling: Secondary | ICD-10-CM | POA: Diagnosis not present

## 2018-07-31 DIAGNOSIS — N3281 Overactive bladder: Secondary | ICD-10-CM | POA: Diagnosis not present

## 2018-07-31 DIAGNOSIS — R293 Abnormal posture: Secondary | ICD-10-CM | POA: Diagnosis not present

## 2018-08-01 DIAGNOSIS — Z03818 Encounter for observation for suspected exposure to other biological agents ruled out: Secondary | ICD-10-CM | POA: Diagnosis not present

## 2018-08-03 ENCOUNTER — Other Ambulatory Visit: Payer: Self-pay | Admitting: *Deleted

## 2018-08-03 DIAGNOSIS — R296 Repeated falls: Secondary | ICD-10-CM | POA: Diagnosis not present

## 2018-08-03 DIAGNOSIS — R293 Abnormal posture: Secondary | ICD-10-CM | POA: Diagnosis not present

## 2018-08-03 DIAGNOSIS — M6281 Muscle weakness (generalized): Secondary | ICD-10-CM | POA: Diagnosis not present

## 2018-08-03 DIAGNOSIS — M818 Other osteoporosis without current pathological fracture: Secondary | ICD-10-CM | POA: Diagnosis not present

## 2018-08-03 DIAGNOSIS — Z9181 History of falling: Secondary | ICD-10-CM | POA: Diagnosis not present

## 2018-08-03 DIAGNOSIS — N3281 Overactive bladder: Secondary | ICD-10-CM | POA: Diagnosis not present

## 2018-08-03 LAB — NOVEL CORONAVIRUS, NAA: SARS-CoV-2, NAA: NOT DETECTED

## 2018-08-03 NOTE — Telephone Encounter (Signed)
Crystal with [facility] called requesting refill on patient's pain medication.  Pended Rx and sent to [facility] for approval.

## 2018-08-04 ENCOUNTER — Other Ambulatory Visit: Payer: Self-pay

## 2018-08-04 DIAGNOSIS — N3281 Overactive bladder: Secondary | ICD-10-CM | POA: Diagnosis not present

## 2018-08-04 DIAGNOSIS — M818 Other osteoporosis without current pathological fracture: Secondary | ICD-10-CM | POA: Diagnosis not present

## 2018-08-04 DIAGNOSIS — M6281 Muscle weakness (generalized): Secondary | ICD-10-CM | POA: Diagnosis not present

## 2018-08-04 DIAGNOSIS — R293 Abnormal posture: Secondary | ICD-10-CM | POA: Diagnosis not present

## 2018-08-04 DIAGNOSIS — Z9181 History of falling: Secondary | ICD-10-CM | POA: Diagnosis not present

## 2018-08-04 DIAGNOSIS — R296 Repeated falls: Secondary | ICD-10-CM | POA: Diagnosis not present

## 2018-08-04 MED ORDER — HYDROCODONE-ACETAMINOPHEN 5-325 MG PO TABS: ORAL_TABLET | ORAL | 0 refills | Status: DC

## 2018-08-04 NOTE — Telephone Encounter (Signed)
Received fax from [facility] requesting refill on Hydrocodone  Last refill 6/30

## 2018-08-05 DIAGNOSIS — N3281 Overactive bladder: Secondary | ICD-10-CM | POA: Diagnosis not present

## 2018-08-05 DIAGNOSIS — M6281 Muscle weakness (generalized): Secondary | ICD-10-CM | POA: Diagnosis not present

## 2018-08-05 DIAGNOSIS — R55 Syncope and collapse: Secondary | ICD-10-CM | POA: Diagnosis not present

## 2018-08-05 DIAGNOSIS — R293 Abnormal posture: Secondary | ICD-10-CM | POA: Diagnosis not present

## 2018-08-05 DIAGNOSIS — Z9181 History of falling: Secondary | ICD-10-CM | POA: Diagnosis not present

## 2018-08-05 DIAGNOSIS — M818 Other osteoporosis without current pathological fracture: Secondary | ICD-10-CM | POA: Diagnosis not present

## 2018-08-05 DIAGNOSIS — R296 Repeated falls: Secondary | ICD-10-CM | POA: Diagnosis not present

## 2018-08-06 DIAGNOSIS — N3281 Overactive bladder: Secondary | ICD-10-CM | POA: Diagnosis not present

## 2018-08-06 DIAGNOSIS — R296 Repeated falls: Secondary | ICD-10-CM | POA: Diagnosis not present

## 2018-08-06 DIAGNOSIS — Z9181 History of falling: Secondary | ICD-10-CM | POA: Diagnosis not present

## 2018-08-06 DIAGNOSIS — R293 Abnormal posture: Secondary | ICD-10-CM | POA: Diagnosis not present

## 2018-08-06 DIAGNOSIS — M6281 Muscle weakness (generalized): Secondary | ICD-10-CM | POA: Diagnosis not present

## 2018-08-06 DIAGNOSIS — M818 Other osteoporosis without current pathological fracture: Secondary | ICD-10-CM | POA: Diagnosis not present

## 2018-08-07 DIAGNOSIS — N3281 Overactive bladder: Secondary | ICD-10-CM | POA: Diagnosis not present

## 2018-08-07 DIAGNOSIS — R293 Abnormal posture: Secondary | ICD-10-CM | POA: Diagnosis not present

## 2018-08-07 DIAGNOSIS — R296 Repeated falls: Secondary | ICD-10-CM | POA: Diagnosis not present

## 2018-08-07 DIAGNOSIS — M818 Other osteoporosis without current pathological fracture: Secondary | ICD-10-CM | POA: Diagnosis not present

## 2018-08-07 DIAGNOSIS — M6281 Muscle weakness (generalized): Secondary | ICD-10-CM | POA: Diagnosis not present

## 2018-08-07 DIAGNOSIS — Z9181 History of falling: Secondary | ICD-10-CM | POA: Diagnosis not present

## 2018-08-10 ENCOUNTER — Other Ambulatory Visit: Payer: Self-pay

## 2018-08-10 DIAGNOSIS — M818 Other osteoporosis without current pathological fracture: Secondary | ICD-10-CM | POA: Diagnosis not present

## 2018-08-10 DIAGNOSIS — Z9181 History of falling: Secondary | ICD-10-CM | POA: Diagnosis not present

## 2018-08-10 DIAGNOSIS — M6281 Muscle weakness (generalized): Secondary | ICD-10-CM | POA: Diagnosis not present

## 2018-08-10 DIAGNOSIS — R296 Repeated falls: Secondary | ICD-10-CM | POA: Diagnosis not present

## 2018-08-10 DIAGNOSIS — R293 Abnormal posture: Secondary | ICD-10-CM | POA: Diagnosis not present

## 2018-08-10 DIAGNOSIS — N3281 Overactive bladder: Secondary | ICD-10-CM | POA: Diagnosis not present

## 2018-08-10 NOTE — Telephone Encounter (Signed)
Pharmacy is requesting a refill on this medication it was last refilled on 08/04/2018 for 45 pills

## 2018-08-11 DIAGNOSIS — Z9181 History of falling: Secondary | ICD-10-CM | POA: Diagnosis not present

## 2018-08-11 DIAGNOSIS — M6281 Muscle weakness (generalized): Secondary | ICD-10-CM | POA: Diagnosis not present

## 2018-08-11 DIAGNOSIS — R296 Repeated falls: Secondary | ICD-10-CM | POA: Diagnosis not present

## 2018-08-11 DIAGNOSIS — R293 Abnormal posture: Secondary | ICD-10-CM | POA: Diagnosis not present

## 2018-08-11 DIAGNOSIS — N3281 Overactive bladder: Secondary | ICD-10-CM | POA: Diagnosis not present

## 2018-08-11 DIAGNOSIS — M818 Other osteoporosis without current pathological fracture: Secondary | ICD-10-CM | POA: Diagnosis not present

## 2018-08-12 DIAGNOSIS — R293 Abnormal posture: Secondary | ICD-10-CM | POA: Diagnosis not present

## 2018-08-12 DIAGNOSIS — R296 Repeated falls: Secondary | ICD-10-CM | POA: Diagnosis not present

## 2018-08-12 DIAGNOSIS — M818 Other osteoporosis without current pathological fracture: Secondary | ICD-10-CM | POA: Diagnosis not present

## 2018-08-12 DIAGNOSIS — M6281 Muscle weakness (generalized): Secondary | ICD-10-CM | POA: Diagnosis not present

## 2018-08-12 DIAGNOSIS — Z9181 History of falling: Secondary | ICD-10-CM | POA: Diagnosis not present

## 2018-08-12 DIAGNOSIS — N3281 Overactive bladder: Secondary | ICD-10-CM | POA: Diagnosis not present

## 2018-08-13 DIAGNOSIS — N3281 Overactive bladder: Secondary | ICD-10-CM | POA: Diagnosis not present

## 2018-08-13 DIAGNOSIS — R296 Repeated falls: Secondary | ICD-10-CM | POA: Diagnosis not present

## 2018-08-13 DIAGNOSIS — M6281 Muscle weakness (generalized): Secondary | ICD-10-CM | POA: Diagnosis not present

## 2018-08-13 DIAGNOSIS — M818 Other osteoporosis without current pathological fracture: Secondary | ICD-10-CM | POA: Diagnosis not present

## 2018-08-13 DIAGNOSIS — Z9181 History of falling: Secondary | ICD-10-CM | POA: Diagnosis not present

## 2018-08-13 DIAGNOSIS — R293 Abnormal posture: Secondary | ICD-10-CM | POA: Diagnosis not present

## 2018-08-14 DIAGNOSIS — M818 Other osteoporosis without current pathological fracture: Secondary | ICD-10-CM | POA: Diagnosis not present

## 2018-08-14 DIAGNOSIS — N3281 Overactive bladder: Secondary | ICD-10-CM | POA: Diagnosis not present

## 2018-08-14 DIAGNOSIS — R293 Abnormal posture: Secondary | ICD-10-CM | POA: Diagnosis not present

## 2018-08-14 DIAGNOSIS — M6281 Muscle weakness (generalized): Secondary | ICD-10-CM | POA: Diagnosis not present

## 2018-08-14 DIAGNOSIS — Z9181 History of falling: Secondary | ICD-10-CM | POA: Diagnosis not present

## 2018-08-14 DIAGNOSIS — R296 Repeated falls: Secondary | ICD-10-CM | POA: Diagnosis not present

## 2018-08-17 DIAGNOSIS — R296 Repeated falls: Secondary | ICD-10-CM | POA: Diagnosis not present

## 2018-08-17 DIAGNOSIS — N3281 Overactive bladder: Secondary | ICD-10-CM | POA: Diagnosis not present

## 2018-08-17 DIAGNOSIS — M6281 Muscle weakness (generalized): Secondary | ICD-10-CM | POA: Diagnosis not present

## 2018-08-17 DIAGNOSIS — M818 Other osteoporosis without current pathological fracture: Secondary | ICD-10-CM | POA: Diagnosis not present

## 2018-08-17 DIAGNOSIS — Z9181 History of falling: Secondary | ICD-10-CM | POA: Diagnosis not present

## 2018-08-17 DIAGNOSIS — R293 Abnormal posture: Secondary | ICD-10-CM | POA: Diagnosis not present

## 2018-08-18 DIAGNOSIS — R293 Abnormal posture: Secondary | ICD-10-CM | POA: Diagnosis not present

## 2018-08-18 DIAGNOSIS — N3281 Overactive bladder: Secondary | ICD-10-CM | POA: Diagnosis not present

## 2018-08-18 DIAGNOSIS — Z9181 History of falling: Secondary | ICD-10-CM | POA: Diagnosis not present

## 2018-08-18 DIAGNOSIS — M818 Other osteoporosis without current pathological fracture: Secondary | ICD-10-CM | POA: Diagnosis not present

## 2018-08-18 DIAGNOSIS — R296 Repeated falls: Secondary | ICD-10-CM | POA: Diagnosis not present

## 2018-08-18 DIAGNOSIS — M6281 Muscle weakness (generalized): Secondary | ICD-10-CM | POA: Diagnosis not present

## 2018-08-19 DIAGNOSIS — Z9181 History of falling: Secondary | ICD-10-CM | POA: Diagnosis not present

## 2018-08-19 DIAGNOSIS — M6281 Muscle weakness (generalized): Secondary | ICD-10-CM | POA: Diagnosis not present

## 2018-08-19 DIAGNOSIS — M818 Other osteoporosis without current pathological fracture: Secondary | ICD-10-CM | POA: Diagnosis not present

## 2018-08-19 DIAGNOSIS — N3281 Overactive bladder: Secondary | ICD-10-CM | POA: Diagnosis not present

## 2018-08-19 DIAGNOSIS — R293 Abnormal posture: Secondary | ICD-10-CM | POA: Diagnosis not present

## 2018-08-19 DIAGNOSIS — R296 Repeated falls: Secondary | ICD-10-CM | POA: Diagnosis not present

## 2018-08-20 ENCOUNTER — Encounter: Payer: Self-pay | Admitting: Internal Medicine

## 2018-08-20 ENCOUNTER — Non-Acute Institutional Stay (SKILLED_NURSING_FACILITY): Payer: Medicare Other | Admitting: Internal Medicine

## 2018-08-20 DIAGNOSIS — R296 Repeated falls: Secondary | ICD-10-CM | POA: Diagnosis not present

## 2018-08-20 DIAGNOSIS — M810 Age-related osteoporosis without current pathological fracture: Secondary | ICD-10-CM

## 2018-08-20 DIAGNOSIS — M818 Other osteoporosis without current pathological fracture: Secondary | ICD-10-CM | POA: Diagnosis not present

## 2018-08-20 DIAGNOSIS — M6281 Muscle weakness (generalized): Secondary | ICD-10-CM | POA: Diagnosis not present

## 2018-08-20 DIAGNOSIS — E039 Hypothyroidism, unspecified: Secondary | ICD-10-CM | POA: Diagnosis not present

## 2018-08-20 DIAGNOSIS — R293 Abnormal posture: Secondary | ICD-10-CM | POA: Diagnosis not present

## 2018-08-20 DIAGNOSIS — N3281 Overactive bladder: Secondary | ICD-10-CM | POA: Diagnosis not present

## 2018-08-20 DIAGNOSIS — S52501S Unspecified fracture of the lower end of right radius, sequela: Secondary | ICD-10-CM

## 2018-08-20 DIAGNOSIS — R001 Bradycardia, unspecified: Secondary | ICD-10-CM

## 2018-08-20 DIAGNOSIS — Z9181 History of falling: Secondary | ICD-10-CM | POA: Diagnosis not present

## 2018-08-20 NOTE — Progress Notes (Signed)
Location:  [place] Room Number: 25A Place of Service:  SNF (31) Provider: Dr. Veleta Miners  Mast, Man X, NP  Patient Care Team: Mast, Man X, NP as PCP - General (Internal Medicine) Newton Pigg, MD as Consulting Physician (Obstetrics and Gynecology) Monna Fam, MD as Consulting Physician (Ophthalmology) Melida Quitter, MD as Consulting Physician (Otolaryngology) Myrlene Broker, MD as Consulting Physician (Urology) [place], [facility] Virgie Dad, MD as Consulting Physician (Internal Medicine)  Extended Emergency Contact Information Primary Emergency Contact: Buckels,[name] Address: [place], [place] Phone: [phone] Work Phone: [phone] Mobile Phone: [phone] Relation: Son Secondary Emergency Contact: Teofilo [place] Phone: [phone] Work Phone: [phone] Relation: Friend  Code Status: DNR Goals of care: Advanced Directive information Advanced Directives 08/20/2018  Does Patient Have a Medical Advance Directive? Yes  Type of Paramedic of [place];Living will;Out of facility DNR (pink MOST or yellow form)  Does patient want to make changes to medical advance directive? No - Patient declined  Copy of [place] in Chart? Yes - validated most recent copy scanned in chart (See row information)  Pre-existing out of facility DNR order (yellow form or pink MOST form) Yellow form placed in chart (order not valid for inpatient use);Pink MOST form placed in chart (order not valid for inpatient use)     Chief Complaint  Patient presents with  . Medical Management of Chronic Issues    Routine Visit     HPI:  Pt is a 85 y.o. female seen today for medical management of chronic diseases.    Patient has h/o Sinus Bradycardia, LE edema, Hypothyroidism, Depression, Chronic Sinusitis, H/o Hip Fracture,And  recent Proximal Humerus Fracture due to Fall  Patient is following with Ortho for that. She is doing well. Denies any Pain. No New Nursing issues. Her weight is stable   Past Medical History:  Diagnosis Date  . Asthma   . Carpal tunnel syndrome 03/07/2009  . Disturbance of skin sensation 02/21/2009  . Diverticulosis of colon (without mention of hemorrhage) 02/16/2009  . Fracture of upper end of left humerus 11/18/14  . 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 Hypothyroid   . Osteoarthrosis, unspecified whether generalized or localized, unspecified site 02/16/2009  . Other abnormal blood chemistry 06/19/2010   hyperglycemia  . Other and unspecified hyperlipidemia 10/24/2009  . Other atopic dermatitis and related conditions 02/16/2009  . Otosclerosis, unspecified 02/22/2003  . Pain in limb 12/05/2009  . Senile osteoporosis 06/16/2010  . Sensorineural hearing loss, unspecified 02/21/2009  . Syncope and collapse 05/30/2009  . Unspecified nasal polyp 02/21/2009  . Unspecified urinary incontinence 02/21/2009   Past Surgical History:  Procedure Laterality Date  . ABDOMINAL HYSTERECTOMY  1999   Dr. Collier Bullock  . BREAST BIOPSY  07/16/1990   benign left breast needle biopsy  Dr. Margot Chimes  . CARPAL TUNNEL RELEASE  04/07/2009   right Dr. Daylene Katayama  . CATARACT EXTRACTION W/ INTRAOCULAR LENS  IMPLANT, BILATERAL Bilateral   . INTRAMEDULLARY (IM) NAIL INTERTROCHANTERIC Right 09/17/2017   Procedure: INTRAMEDULLARY (IM) NAIL INTERTROCHANTRIC HIP;  Surgeon: Nicholes Stairs, MD;  Location: [place];  Service: Orthopedics;  Laterality: Right;  . OPEN REDUCTION INTERNAL FIXATION (ORIF) DISTAL RADIAL FRACTURE Right 09/17/2017   Procedure: OPEN REDUCTION INTERNAL FIXATION (ORIF) DISTAL RADIAL FRACTURE;  Surgeon: Iran Planas, MD;  Location: [place];  Service: Orthopedics;  Laterality: Right;  . TRIGGER FINGER RELEASE Left 08/09/2014   Procedure: RELEASE TRIGGER FINGER/A-1 PULLEY LEFT MIDDLE FINGER;  Surgeon: Daryll Brod, MD;   Location: [place];  Service: Orthopedics;  Laterality: Left;  REGIONAL/FAB    Allergies  Allergen Reactions  . Sulfa Antibiotics Itching and Rash  . Amoxicillin Other (See Comments)    Per [facility] 09/15/17 Has patient had a PCN reaction causing immediate rash, facial/tongue/throat swelling, SOB or lightheadedness with hypotension: Unknown Has patient had a PCN reaction causing severe rash involving mucus membranes or skin necrosis: Unknown Has patient had a PCN reaction that required hospitalization: Unknown Has patient had a PCN reaction occurring within the last 10 years: Unknown If all of the above answers are "NO", then may proceed with Cephalosporin use.  . Avelox [Moxifloxacin Hcl In Nacl] Itching  . Doxycycline Other (See Comments)    Unknown reaction  . Hista-Tabs [Triprolidine-Pse] Other (See Comments)    Passed out  . Pyrilamine-Phenylephrine Other (See Comments)    Unknown reaction - per [facility] 09/15/17  . Septra [Sulfamethoxazole-Trimethoprim] Other (See Comments)    Unknown reaction    Outpatient Encounter Medications as of 08/20/2018  Medication Sig  . acetaminophen (TYLENOL) 500 MG tablet Take 1,000 mg by mouth 3 (three) times daily.  Marland Kitchen albuterol (VENTOLIN HFA) 108 (90 Base) MCG/ACT inhaler Inhale 2 puffs into the lungs every 6 (six) hours as needed for shortness of breath (dyspnea).   Marland Kitchen aspirin EC 81 MG tablet Take 81 mg by mouth daily. On Tuesday and Saturday  . escitalopram (LEXAPRO) 20 MG tablet Take 20 mg by mouth daily.  . famotidine (PEPCID) 20 MG tablet Take 20 mg by mouth daily.  . feeding supplement (BOOST HIGH PROTEIN) LIQD Take 237 mLs by mouth 2 (two) times daily. Add ice cream to boost Plus twice a day  . ferrous sulfate 325 (65 FE) MG tablet Take 325 mg by mouth daily with breakfast.  . Fluticasone-Salmeterol (ADVAIR) 250-50 MCG/DOSE AEPB Inhale 1 puff 2 (two) times daily into the lungs. Rinse mouth after use  . furosemide (LASIX) 20 MG tablet  Take 20 mg by mouth daily. Hold for SBP < 110  . HYDROcodone-acetaminophen (NORCO/VICODIN) 5-325 MG tablet Take 1/2 tablet by mouth at 6am, 7pm and 8pm  For right shoulder pain.  Marland Kitchen ipratropium (ATROVENT) 0.02 % nebulizer solution Take 0.5 mg by nebulization every 6 (six) hours as needed for wheezing or shortness of breath.  . levothyroxine (SYNTHROID, LEVOTHROID) 25 MCG tablet Take 25 mcg by mouth daily before breakfast.  . mirtazapine (REMERON) 15 MG tablet Take 7.5 mg by mouth at bedtime.   . OXYGEN Inhale 2 L/min into the lungs as needed (to keep O2 SATS >90%).   . polyethylene glycol (MIRALAX / GLYCOLAX) packet Take 17 g by mouth daily.  . raloxifene (EVISTA) 60 MG tablet Take 1 tablet by mouth  daily to treat osteoporosis  . zinc oxide 20 % ointment Apply 1 application topically 2 (two) times daily. Also apply every Shift - PRN  Apply to buttocks as needed every shift after incontinent episodes   No facility-administered encounter medications on file as of 08/20/2018.     Review of Systems  Review of Systems  Constitutional: Negative for activity change, appetite change, chills, diaphoresis, fatigue and fever.  HENT: Negative for mouth sores, postnasal drip, rhinorrhea, sinus pain and sore throat.   Respiratory: Negative for apnea, cough, chest tightness, shortness of breath and wheezing.  Cardiovascular: Negative for chest pain, palpitations and leg swelling.  Gastrointestinal: Negative for abdominal distention, abdominal pain, constipation, diarrhea, nausea and vomiting.  Genitourinary: Negative for dysuria and frequency.  Musculoskeletal: Negative for arthralgias, joint swelling and myalgias.  Skin: Negative for rash.  Neurological: Negative for dizziness, syncope, weakness, light-headedness and numbness.  Psychiatric/Behavioral: Negative for behavioral problems, confusion and sleep disturbance.     Immunization History  Administered Date(s) Administered  . Influenza Whole  12/27/2011, 11/05/2012  . Influenza-Unspecified 11/18/2013, 11/03/2014, 11/16/2015, 11/13/2016, 11/17/2017  . PPD Test 04/04/2008, 11/23/2014  . Pneumococcal Conjugate-13 09/30/2016  . Pneumococcal Polysaccharide-23 02/05/2008, 06/03/2008  . Td 02/05/2008, 06/03/2008   Pertinent  Health Maintenance Due  Topic Date Due  . INFLUENZA VACCINE  09/05/2018  . DEXA SCAN  Completed  . PNA vac Low Risk Adult  Completed   Fall Risk  10/09/2017 09/23/2016 08/27/2016 05/30/2015 05/16/2015  Falls in the past year? Yes Yes Yes Yes No  Comment - - Emmi Telephone Survey: data to providers prior to load - -  Number falls in past yr: 2 or more 1 1 1  -  Comment - - Emmi Telephone Survey Actual Response = 1 - -  Injury with Fall? Yes No No Yes -  Comment broke R wrist - - - -  Risk Factor Category  - - - - -  Risk for fall due to : - - - History of fall(s);Impaired balance/gait;Impaired mobility -  Follow up - - - Falls evaluation completed;Education provided -   Functional Status Survey:    Vitals:   08/20/18 1041  BP: (!) 145/68  Pulse: (!) 54  Resp: 18  Temp: 98.2 F (36.8 C)  TempSrc: Oral  SpO2: 95%  Weight: 135 lb 6.4 oz (61.4 kg)  Height: 4\' 8"  (1.422 m)   Body mass index is 30.36 kg/m. Physical Exam Constitutional:  Well-developed and well-nourished.  HENT:  Head: Normocephalic.  Mouth/Throat: Oropharynx is clear and moist.  Eyes: Pupils are equal, round, and reactive to light.  Neck: Neck supple.  Cardiovascular: Normal rate and normal heart sounds.  No murmur heard. Pulmonary/Chest: Effort normal and breath sounds normal. No respiratory distress. No wheezes. She has no rales.  Abdominal: Soft. Bowel sounds are normal. No distension. There is no tenderness. There is no rebound.  Musculoskeletal: No edema.  Lymphadenopathy: none Neurological: No Focal deficits Right Arm in Sling. Skin: Skin is warm and dry.  Psychiatric: Normal mood and affect. Behavior is normal. Thought  content normal.   Labs reviewed: Recent Labs    09/19/17 0655 09/20/17 0330 09/21/17 0235  09/22/17 09/29/17 03/19/18 06/17/18  NA 142 143 143   < > 142 143 142 143  K 3.9 4.3 4.4   < > 4.9 4.5 4.3 4.0  CL 111 112* 112*  --   --   --   --   --   CO2 25 25 26   --   --   --   --   --   GLUCOSE 108* 129* 112*  --   --   --   --   --   BUN 23 23 22    < > 21 19 27* 27*  CREATININE 0.98 0.81 0.80   < > 0.76 0.96 1.1 1.0  CALCIUM 8.1* 8.1* 8.4*  --  8.6 9.0  --   --   MG 2.3 2.3 2.2  --   --   --   --   --    < > =  values in this interval not displayed.   Recent Labs    09/29/17 03/19/18 06/17/18  AST 15 14 14   ALT 12 11 11   ALKPHOS 82  --  45  BILITOT 0.9  --   --   PROT 6.2  --   --   ALBUMIN 3.4  --   --    Recent Labs    09/15/17 1907 09/16/17 0407  09/21/17 0235  09/22/17 09/29/17 03/19/18 06/17/18  WBC 16.6* 11.2*   < > 10.9*  --  10.4 9.1 6.0 5.4  NEUTROABS 14.0* 8.7*  --   --   --   --   --   --   --   HGB 12.7 11.4*   < > 8.9*   < > 9.5 10.4 12.3 12.9  HCT 40.3 37.2   < > 28.5*   < > 28.3 31 38 39  MCV 93.3 94.7   < > 96.3  --  91.3 93.1  --   --   PLT 252 251   < > 263  --   --   --  245 224   < > = values in this interval not displayed.   Lab Results  Component Value Date   TSH 1.62 05/11/2018   Lab Results  Component Value Date   HGBA1C 6.0 06/17/2018   Lab Results  Component Value Date   CHOL 211 (A) 02/14/2014   HDL 47 02/14/2014   LDLCALC 133 02/14/2014   TRIG 155 02/14/2014    Significant Diagnostic Results in last 30 days:   Assessment/Plan  Sinus bradycardia Patient continues to be asymptomatic  S/P Right Humerus Fracture Not in Pain. Controlled on Low dose of Norco Follow up with Ortho GERD On Pepcid  Acquired hypothyroidism TSH normal in 4/20 Continue Same dose  Age-related osteoporosis On Evista   Iron deficiency anemia, Hgb was normal in 05/20  Major depression Patient stable on Remeron and Celexa Not candidate  for GDR  LE Edema,  Stable on Lasix BUN and Creat stable   Family/ staff Communication:   Labs/tests ordered:    Total time spent in this patient care encounter was  25_  minutes; greater than 50% of the visit spent counseling patient and staff, reviewing records , Labs and coordinating care for problems addressed at this encounter.

## 2018-08-21 DIAGNOSIS — R293 Abnormal posture: Secondary | ICD-10-CM | POA: Diagnosis not present

## 2018-08-21 DIAGNOSIS — R296 Repeated falls: Secondary | ICD-10-CM | POA: Diagnosis not present

## 2018-08-21 DIAGNOSIS — M818 Other osteoporosis without current pathological fracture: Secondary | ICD-10-CM | POA: Diagnosis not present

## 2018-08-21 DIAGNOSIS — Z9181 History of falling: Secondary | ICD-10-CM | POA: Diagnosis not present

## 2018-08-21 DIAGNOSIS — M6281 Muscle weakness (generalized): Secondary | ICD-10-CM | POA: Diagnosis not present

## 2018-08-21 DIAGNOSIS — N3281 Overactive bladder: Secondary | ICD-10-CM | POA: Diagnosis not present

## 2018-08-24 DIAGNOSIS — Z9181 History of falling: Secondary | ICD-10-CM | POA: Diagnosis not present

## 2018-08-24 DIAGNOSIS — M818 Other osteoporosis without current pathological fracture: Secondary | ICD-10-CM | POA: Diagnosis not present

## 2018-08-24 DIAGNOSIS — N3281 Overactive bladder: Secondary | ICD-10-CM | POA: Diagnosis not present

## 2018-08-24 DIAGNOSIS — M6281 Muscle weakness (generalized): Secondary | ICD-10-CM | POA: Diagnosis not present

## 2018-08-24 DIAGNOSIS — R296 Repeated falls: Secondary | ICD-10-CM | POA: Diagnosis not present

## 2018-08-24 DIAGNOSIS — R293 Abnormal posture: Secondary | ICD-10-CM | POA: Diagnosis not present

## 2018-08-25 DIAGNOSIS — Z9181 History of falling: Secondary | ICD-10-CM | POA: Diagnosis not present

## 2018-08-25 DIAGNOSIS — R296 Repeated falls: Secondary | ICD-10-CM | POA: Diagnosis not present

## 2018-08-25 DIAGNOSIS — M6281 Muscle weakness (generalized): Secondary | ICD-10-CM | POA: Diagnosis not present

## 2018-08-25 DIAGNOSIS — N3281 Overactive bladder: Secondary | ICD-10-CM | POA: Diagnosis not present

## 2018-08-25 DIAGNOSIS — M818 Other osteoporosis without current pathological fracture: Secondary | ICD-10-CM | POA: Diagnosis not present

## 2018-08-25 DIAGNOSIS — R293 Abnormal posture: Secondary | ICD-10-CM | POA: Diagnosis not present

## 2018-08-26 DIAGNOSIS — R293 Abnormal posture: Secondary | ICD-10-CM | POA: Diagnosis not present

## 2018-08-26 DIAGNOSIS — R296 Repeated falls: Secondary | ICD-10-CM | POA: Diagnosis not present

## 2018-08-26 DIAGNOSIS — N3281 Overactive bladder: Secondary | ICD-10-CM | POA: Diagnosis not present

## 2018-08-26 DIAGNOSIS — M6281 Muscle weakness (generalized): Secondary | ICD-10-CM | POA: Diagnosis not present

## 2018-08-26 DIAGNOSIS — Z9181 History of falling: Secondary | ICD-10-CM | POA: Diagnosis not present

## 2018-08-26 DIAGNOSIS — M818 Other osteoporosis without current pathological fracture: Secondary | ICD-10-CM | POA: Diagnosis not present

## 2018-08-27 DIAGNOSIS — R293 Abnormal posture: Secondary | ICD-10-CM | POA: Diagnosis not present

## 2018-08-27 DIAGNOSIS — N3281 Overactive bladder: Secondary | ICD-10-CM | POA: Diagnosis not present

## 2018-08-27 DIAGNOSIS — R296 Repeated falls: Secondary | ICD-10-CM | POA: Diagnosis not present

## 2018-08-27 DIAGNOSIS — M818 Other osteoporosis without current pathological fracture: Secondary | ICD-10-CM | POA: Diagnosis not present

## 2018-08-27 DIAGNOSIS — Z9181 History of falling: Secondary | ICD-10-CM | POA: Diagnosis not present

## 2018-08-27 DIAGNOSIS — M6281 Muscle weakness (generalized): Secondary | ICD-10-CM | POA: Diagnosis not present

## 2018-08-28 DIAGNOSIS — Z9181 History of falling: Secondary | ICD-10-CM | POA: Diagnosis not present

## 2018-08-28 DIAGNOSIS — M818 Other osteoporosis without current pathological fracture: Secondary | ICD-10-CM | POA: Diagnosis not present

## 2018-08-28 DIAGNOSIS — S42291A Other displaced fracture of upper end of right humerus, initial encounter for closed fracture: Secondary | ICD-10-CM | POA: Diagnosis not present

## 2018-08-28 DIAGNOSIS — M6281 Muscle weakness (generalized): Secondary | ICD-10-CM | POA: Diagnosis not present

## 2018-08-28 DIAGNOSIS — R296 Repeated falls: Secondary | ICD-10-CM | POA: Diagnosis not present

## 2018-08-28 DIAGNOSIS — N3281 Overactive bladder: Secondary | ICD-10-CM | POA: Diagnosis not present

## 2018-08-28 DIAGNOSIS — R293 Abnormal posture: Secondary | ICD-10-CM | POA: Diagnosis not present

## 2018-08-31 DIAGNOSIS — M818 Other osteoporosis without current pathological fracture: Secondary | ICD-10-CM | POA: Diagnosis not present

## 2018-08-31 DIAGNOSIS — R293 Abnormal posture: Secondary | ICD-10-CM | POA: Diagnosis not present

## 2018-08-31 DIAGNOSIS — Z9181 History of falling: Secondary | ICD-10-CM | POA: Diagnosis not present

## 2018-08-31 DIAGNOSIS — M6281 Muscle weakness (generalized): Secondary | ICD-10-CM | POA: Diagnosis not present

## 2018-08-31 DIAGNOSIS — N3281 Overactive bladder: Secondary | ICD-10-CM | POA: Diagnosis not present

## 2018-08-31 DIAGNOSIS — R296 Repeated falls: Secondary | ICD-10-CM | POA: Diagnosis not present

## 2018-09-01 ENCOUNTER — Other Ambulatory Visit: Payer: Self-pay | Admitting: *Deleted

## 2018-09-01 DIAGNOSIS — R293 Abnormal posture: Secondary | ICD-10-CM | POA: Diagnosis not present

## 2018-09-01 DIAGNOSIS — R296 Repeated falls: Secondary | ICD-10-CM | POA: Diagnosis not present

## 2018-09-01 DIAGNOSIS — N3281 Overactive bladder: Secondary | ICD-10-CM | POA: Diagnosis not present

## 2018-09-01 DIAGNOSIS — M818 Other osteoporosis without current pathological fracture: Secondary | ICD-10-CM | POA: Diagnosis not present

## 2018-09-01 DIAGNOSIS — M6281 Muscle weakness (generalized): Secondary | ICD-10-CM | POA: Diagnosis not present

## 2018-09-01 DIAGNOSIS — Z9181 History of falling: Secondary | ICD-10-CM | POA: Diagnosis not present

## 2018-09-01 MED ORDER — HYDROCODONE-ACETAMINOPHEN 5-325 MG PO TABS: ORAL_TABLET | ORAL | 0 refills | Status: DC

## 2018-09-01 NOTE — Telephone Encounter (Signed)
[facility] and sent to [facility] for approval.

## 2018-09-02 DIAGNOSIS — N3281 Overactive bladder: Secondary | ICD-10-CM | POA: Diagnosis not present

## 2018-09-02 DIAGNOSIS — M6281 Muscle weakness (generalized): Secondary | ICD-10-CM | POA: Diagnosis not present

## 2018-09-02 DIAGNOSIS — Z9181 History of falling: Secondary | ICD-10-CM | POA: Diagnosis not present

## 2018-09-02 DIAGNOSIS — R296 Repeated falls: Secondary | ICD-10-CM | POA: Diagnosis not present

## 2018-09-02 DIAGNOSIS — R293 Abnormal posture: Secondary | ICD-10-CM | POA: Diagnosis not present

## 2018-09-02 DIAGNOSIS — M818 Other osteoporosis without current pathological fracture: Secondary | ICD-10-CM | POA: Diagnosis not present

## 2018-09-03 DIAGNOSIS — R296 Repeated falls: Secondary | ICD-10-CM | POA: Diagnosis not present

## 2018-09-03 DIAGNOSIS — M6281 Muscle weakness (generalized): Secondary | ICD-10-CM | POA: Diagnosis not present

## 2018-09-03 DIAGNOSIS — R293 Abnormal posture: Secondary | ICD-10-CM | POA: Diagnosis not present

## 2018-09-03 DIAGNOSIS — M818 Other osteoporosis without current pathological fracture: Secondary | ICD-10-CM | POA: Diagnosis not present

## 2018-09-03 DIAGNOSIS — N3281 Overactive bladder: Secondary | ICD-10-CM | POA: Diagnosis not present

## 2018-09-03 DIAGNOSIS — Z9181 History of falling: Secondary | ICD-10-CM | POA: Diagnosis not present

## 2018-09-04 DIAGNOSIS — R293 Abnormal posture: Secondary | ICD-10-CM | POA: Diagnosis not present

## 2018-09-04 DIAGNOSIS — M6281 Muscle weakness (generalized): Secondary | ICD-10-CM | POA: Diagnosis not present

## 2018-09-04 DIAGNOSIS — N3281 Overactive bladder: Secondary | ICD-10-CM | POA: Diagnosis not present

## 2018-09-04 DIAGNOSIS — R296 Repeated falls: Secondary | ICD-10-CM | POA: Diagnosis not present

## 2018-09-04 DIAGNOSIS — Z9181 History of falling: Secondary | ICD-10-CM | POA: Diagnosis not present

## 2018-09-04 DIAGNOSIS — M818 Other osteoporosis without current pathological fracture: Secondary | ICD-10-CM | POA: Diagnosis not present

## 2018-09-07 DIAGNOSIS — Z9181 History of falling: Secondary | ICD-10-CM | POA: Diagnosis not present

## 2018-09-07 DIAGNOSIS — N3281 Overactive bladder: Secondary | ICD-10-CM | POA: Diagnosis not present

## 2018-09-07 DIAGNOSIS — R296 Repeated falls: Secondary | ICD-10-CM | POA: Diagnosis not present

## 2018-09-07 DIAGNOSIS — R55 Syncope and collapse: Secondary | ICD-10-CM | POA: Diagnosis not present

## 2018-09-07 DIAGNOSIS — M818 Other osteoporosis without current pathological fracture: Secondary | ICD-10-CM | POA: Diagnosis not present

## 2018-09-07 DIAGNOSIS — R293 Abnormal posture: Secondary | ICD-10-CM | POA: Diagnosis not present

## 2018-09-07 DIAGNOSIS — M6281 Muscle weakness (generalized): Secondary | ICD-10-CM | POA: Diagnosis not present

## 2018-09-08 DIAGNOSIS — R296 Repeated falls: Secondary | ICD-10-CM | POA: Diagnosis not present

## 2018-09-08 DIAGNOSIS — Z9181 History of falling: Secondary | ICD-10-CM | POA: Diagnosis not present

## 2018-09-08 DIAGNOSIS — M818 Other osteoporosis without current pathological fracture: Secondary | ICD-10-CM | POA: Diagnosis not present

## 2018-09-08 DIAGNOSIS — N3281 Overactive bladder: Secondary | ICD-10-CM | POA: Diagnosis not present

## 2018-09-08 DIAGNOSIS — R293 Abnormal posture: Secondary | ICD-10-CM | POA: Diagnosis not present

## 2018-09-08 DIAGNOSIS — M6281 Muscle weakness (generalized): Secondary | ICD-10-CM | POA: Diagnosis not present

## 2018-09-09 DIAGNOSIS — Z9181 History of falling: Secondary | ICD-10-CM | POA: Diagnosis not present

## 2018-09-09 DIAGNOSIS — M6281 Muscle weakness (generalized): Secondary | ICD-10-CM | POA: Diagnosis not present

## 2018-09-09 DIAGNOSIS — N3281 Overactive bladder: Secondary | ICD-10-CM | POA: Diagnosis not present

## 2018-09-09 DIAGNOSIS — R293 Abnormal posture: Secondary | ICD-10-CM | POA: Diagnosis not present

## 2018-09-09 DIAGNOSIS — R296 Repeated falls: Secondary | ICD-10-CM | POA: Diagnosis not present

## 2018-09-09 DIAGNOSIS — M818 Other osteoporosis without current pathological fracture: Secondary | ICD-10-CM | POA: Diagnosis not present

## 2018-09-10 DIAGNOSIS — N3281 Overactive bladder: Secondary | ICD-10-CM | POA: Diagnosis not present

## 2018-09-10 DIAGNOSIS — M6281 Muscle weakness (generalized): Secondary | ICD-10-CM | POA: Diagnosis not present

## 2018-09-10 DIAGNOSIS — M818 Other osteoporosis without current pathological fracture: Secondary | ICD-10-CM | POA: Diagnosis not present

## 2018-09-10 DIAGNOSIS — R296 Repeated falls: Secondary | ICD-10-CM | POA: Diagnosis not present

## 2018-09-10 DIAGNOSIS — Z9181 History of falling: Secondary | ICD-10-CM | POA: Diagnosis not present

## 2018-09-10 DIAGNOSIS — R293 Abnormal posture: Secondary | ICD-10-CM | POA: Diagnosis not present

## 2018-09-14 DIAGNOSIS — M818 Other osteoporosis without current pathological fracture: Secondary | ICD-10-CM | POA: Diagnosis not present

## 2018-09-14 DIAGNOSIS — R293 Abnormal posture: Secondary | ICD-10-CM | POA: Diagnosis not present

## 2018-09-14 DIAGNOSIS — Z9181 History of falling: Secondary | ICD-10-CM | POA: Diagnosis not present

## 2018-09-14 DIAGNOSIS — N3281 Overactive bladder: Secondary | ICD-10-CM | POA: Diagnosis not present

## 2018-09-14 DIAGNOSIS — R296 Repeated falls: Secondary | ICD-10-CM | POA: Diagnosis not present

## 2018-09-14 DIAGNOSIS — M6281 Muscle weakness (generalized): Secondary | ICD-10-CM | POA: Diagnosis not present

## 2018-09-15 ENCOUNTER — Non-Acute Institutional Stay (SKILLED_NURSING_FACILITY): Payer: Medicare Other | Admitting: Nurse Practitioner

## 2018-09-15 ENCOUNTER — Encounter: Payer: Self-pay | Admitting: Nurse Practitioner

## 2018-09-15 DIAGNOSIS — E039 Hypothyroidism, unspecified: Secondary | ICD-10-CM | POA: Diagnosis not present

## 2018-09-15 DIAGNOSIS — M818 Other osteoporosis without current pathological fracture: Secondary | ICD-10-CM | POA: Diagnosis not present

## 2018-09-15 DIAGNOSIS — K219 Gastro-esophageal reflux disease without esophagitis: Secondary | ICD-10-CM | POA: Diagnosis not present

## 2018-09-15 DIAGNOSIS — M6281 Muscle weakness (generalized): Secondary | ICD-10-CM | POA: Diagnosis not present

## 2018-09-15 DIAGNOSIS — K5900 Constipation, unspecified: Secondary | ICD-10-CM | POA: Diagnosis not present

## 2018-09-15 DIAGNOSIS — R296 Repeated falls: Secondary | ICD-10-CM | POA: Diagnosis not present

## 2018-09-15 DIAGNOSIS — R609 Edema, unspecified: Secondary | ICD-10-CM

## 2018-09-15 DIAGNOSIS — J42 Unspecified chronic bronchitis: Secondary | ICD-10-CM

## 2018-09-15 DIAGNOSIS — M159 Polyosteoarthritis, unspecified: Secondary | ICD-10-CM

## 2018-09-15 DIAGNOSIS — M150 Primary generalized (osteo)arthritis: Secondary | ICD-10-CM

## 2018-09-15 DIAGNOSIS — F339 Major depressive disorder, recurrent, unspecified: Secondary | ICD-10-CM

## 2018-09-15 DIAGNOSIS — R293 Abnormal posture: Secondary | ICD-10-CM | POA: Diagnosis not present

## 2018-09-15 DIAGNOSIS — Z9181 History of falling: Secondary | ICD-10-CM | POA: Diagnosis not present

## 2018-09-15 DIAGNOSIS — N3281 Overactive bladder: Secondary | ICD-10-CM | POA: Diagnosis not present

## 2018-09-15 NOTE — Assessment & Plan Note (Signed)
Minimal edema BLE, continue Furosemide 20mg qd.  

## 2018-09-15 NOTE — Assessment & Plan Note (Signed)
Stable, continue Advair 250/50 bid.

## 2018-09-15 NOTE — Assessment & Plan Note (Signed)
Stable, continue MiraLax qd.  

## 2018-09-15 NOTE — Assessment & Plan Note (Signed)
Stable, continue Famotdine 20mg  qd

## 2018-09-15 NOTE — Assessment & Plan Note (Signed)
Stable, continue Levothyroxine 78mcg qd, last TSH 1.62 05/11/18

## 2018-09-15 NOTE — Assessment & Plan Note (Signed)
Multiple sites, R shoulder pain with limited over head ROM, continue Norco 5/325mg  1/2 tab tid po.

## 2018-09-15 NOTE — Progress Notes (Signed)
Location:  [facility] Room Number: 25 Place of Service:  SNF (31) Provider:  Marlana Latus  NP  Ebbie Cherry X, NP  Patient Care Team: Nashia Remus X, NP as PCP - General (Internal Medicine) Newton Pigg, MD as Consulting Physician (Obstetrics and Gynecology) Monna Fam, MD as Consulting Physician (Ophthalmology) Melida Quitter, MD as Consulting Physician (Otolaryngology) Myrlene Broker, MD as Consulting Physician (Urology) [place], Friends [facility] Virgie Dad, MD as Consulting Physician (Internal Medicine)  Extended Emergency Contact Information Primary Emergency Contact: Jerger,[name] Address: [place], [place] Phone: [phone] Work Phone: [phone] Mobile Phone: [phone] Relation: Son Secondary Emergency Contact: Teofilo [place] Phone: [phone] Work Phone: [phone] Relation: Friend  Code Status:  DNR Goals of care: Advanced Directive information Advanced Directives 09/15/2018  Does Patient Have a Medical Advance Directive? Yes  Type of Paramedic of [place];Living will;Out of facility DNR (pink MOST or yellow form)  Does patient want to make changes to medical advance directive? No - Patient declined  Copy of [place] in Chart? Yes - validated most recent copy scanned in chart (See row information)  Pre-existing out of facility DNR order (yellow form or pink MOST form) Yellow form placed in chart (order not valid for inpatient use);Pink MOST form placed in chart (order not valid for inpatient use)     Chief Complaint  Patient presents with  . Medical Management of Chronic Issues    HPI:  Pt is a 85 y.o. female seen today for medical management of chronic diseases.    The patient has history of Hypothyroidism, on Levothyroxine 28mcg qd, last TSH 1.62 05/11/18. Her mood is stable on Mirtazapine 7.5mg   qd, Escitalopram 20mg  qd. Constipation, stable, on MiraLax qd. Edema, stable, taking Furosemide 20mg  qd. COPD, stable, taking Advair 250/50 bid. GERD, stable, taking Famotidine 20mg  qd. She takes Norco 5/325mg  1/2 tab tid for R shoulder pain    Past Medical History:  Diagnosis Date  . Asthma   . Carpal tunnel syndrome 03/07/2009  . Disturbance of skin sensation 02/21/2009  . Diverticulosis of colon (without mention of hemorrhage) 02/16/2009  . Fracture of upper end of left humerus 11/18/14  . 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 Hypothyroid   . Osteoarthrosis, unspecified whether generalized or localized, unspecified site 02/16/2009  . Other abnormal blood chemistry 06/19/2010   hyperglycemia  . Other and unspecified hyperlipidemia 10/24/2009  . Other atopic dermatitis and related conditions 02/16/2009  . Otosclerosis, unspecified 02/22/2003  . Pain in limb 12/05/2009  . Senile osteoporosis 06/16/2010  . Sensorineural hearing loss, unspecified 02/21/2009  . Syncope and collapse 05/30/2009  . Unspecified nasal polyp 02/21/2009  . Unspecified urinary incontinence 02/21/2009   Past Surgical History:  Procedure Laterality Date  . ABDOMINAL HYSTERECTOMY  1999   Dr. Collier Bullock  . BREAST BIOPSY  07/16/1990   benign left breast needle biopsy  Dr. Margot Chimes  . CARPAL TUNNEL RELEASE  04/07/2009   right Dr. Daylene Katayama  . CATARACT EXTRACTION W/ INTRAOCULAR LENS  IMPLANT, BILATERAL Bilateral   . INTRAMEDULLARY (IM) NAIL INTERTROCHANTERIC Right 09/17/2017   Procedure: INTRAMEDULLARY (IM) NAIL INTERTROCHANTRIC HIP;  Surgeon: Nicholes Stairs, MD;  Location: [facility];  Service: Orthopedics;  Laterality: Right;  . OPEN REDUCTION INTERNAL FIXATION (ORIF) DISTAL RADIAL FRACTURE Right 09/17/2017   Procedure: OPEN REDUCTION INTERNAL FIXATION (ORIF)  DISTAL RADIAL FRACTURE;  Surgeon: Iran Planas, MD;  Location: [facility];  Service: Orthopedics;  Laterality: Right;  . TRIGGER FINGER RELEASE Left 08/09/2014   Procedure: RELEASE  TRIGGER FINGER/A-1 PULLEY LEFT MIDDLE FINGER;  Surgeon: Daryll Brod, MD;  Location: [facility];  Service: Orthopedics;  Laterality: Left;  REGIONAL/FAB    Allergies  Allergen Reactions  . Sulfa Antibiotics Itching and Rash  . Amoxicillin Other (See Comments)    Per [facility] 09/15/17 Has patient had a PCN reaction causing immediate rash, facial/tongue/throat swelling, SOB or lightheadedness with hypotension: Unknown Has patient had a PCN reaction causing severe rash involving mucus membranes or skin necrosis: Unknown Has patient had a PCN reaction that required hospitalization: Unknown Has patient had a PCN reaction occurring within the last 10 years: Unknown If all of the above answers are "NO", then may proceed with Cephalosporin use.  . Avelox [Moxifloxacin Hcl In Nacl] Itching  . Doxycycline Other (See Comments)    Unknown reaction  . Hista-Tabs [Triprolidine-Pse] Other (See Comments)    Passed out  . Pyrilamine-Phenylephrine Other (See Comments)    Unknown reaction - per [facility] 09/15/17  . Septra [Sulfamethoxazole-Trimethoprim] Other (See Comments)    Unknown reaction    Outpatient Encounter Medications as of 09/15/2018  Medication Sig  . acetaminophen (TYLENOL) 325 MG tablet Take 650 mg by mouth every 4 (four) hours as needed.  Marland Kitchen albuterol (VENTOLIN HFA) 108 (90 Base) MCG/ACT inhaler Inhale 2 puffs into the lungs every 6 (six) hours as needed for shortness of breath (dyspnea).   Marland Kitchen aspirin EC 81 MG tablet Take 81 mg by mouth daily. On Tuesday and Saturday  . escitalopram (LEXAPRO) 20 MG tablet Take 20 mg by mouth daily.  . famotidine (PEPCID) 20 MG tablet Take 20 mg by mouth daily.  . feeding supplement (BOOST HIGH PROTEIN) LIQD Take 237 mLs by mouth 2 (two) times daily. Add ice cream to boost Plus twice a day  . Fluticasone-Salmeterol (ADVAIR) 250-50 MCG/DOSE AEPB Inhale 1 puff 2 (two) times daily into the lungs. Rinse mouth after use  . furosemide (LASIX) 20 MG tablet Take  20 mg by mouth daily. Hold for SBP < 110  . HYDROcodone-acetaminophen (NORCO/VICODIN) 5-325 MG tablet Take 1/2 tablet by mouth at 6am, 7pm and 8pm  For right shoulder pain.  Marland Kitchen ipratropium (ATROVENT) 0.02 % nebulizer solution Take 0.5 mg by nebulization every 6 (six) hours as needed for wheezing or shortness of breath.  . levothyroxine (SYNTHROID, LEVOTHROID) 25 MCG tablet Take 25 mcg by mouth daily before breakfast.  . mirtazapine (REMERON) 15 MG tablet Take 7.5 mg by mouth at bedtime.   . OXYGEN Inhale 2 L/min into the lungs as needed (to keep O2 SATS >90%).   . polyethylene glycol (MIRALAX / GLYCOLAX) packet Take 17 g by mouth daily.  . raloxifene (EVISTA) 60 MG tablet Take 1 tablet by mouth  daily to treat osteoporosis  . zinc oxide 20 % ointment Apply 1 application topically 2 (two) times daily. Also apply every Shift - PRN  Apply to buttocks as needed every shift after incontinent episodes  . [DISCONTINUED] acetaminophen (TYLENOL) 500 MG tablet Take 1,000 mg by mouth 3 (three) times daily.  . [DISCONTINUED] ferrous sulfate 325 (65 FE) MG tablet Take 325 mg by mouth daily with breakfast.   No facility-administered encounter medications on file as of 09/15/2018.    ROS was provided with assistance of staff Review of Systems  Constitutional: Negative for activity change, appetite  change, chills, diaphoresis, fatigue, fever and unexpected weight change.  HENT: Positive for hearing loss. Negative for congestion and voice change.   Respiratory: Positive for cough. Negative for shortness of breath and wheezing.   Cardiovascular: Positive for leg swelling.  Gastrointestinal: Negative for abdominal distention, abdominal pain, constipation, diarrhea, nausea and vomiting.  Genitourinary: Negative for difficulty urinating, dysuria and urgency.  Musculoskeletal: Positive for arthralgias and gait problem.  Skin: Negative for color change and pallor.  Neurological: Negative for dizziness, speech  difficulty, weakness and headaches.       Memory lapses  Psychiatric/Behavioral: Negative for agitation, behavioral problems, hallucinations and sleep disturbance. The patient is not nervous/anxious.     Immunization History  Administered Date(s) Administered  . Influenza Whole 12/27/2011, 11/05/2012  . Influenza-Unspecified 11/18/2013, 11/03/2014, 11/16/2015, 11/13/2016, 11/17/2017  . PPD Test 04/04/2008, 11/23/2014  . Pneumococcal Conjugate-13 09/30/2016  . Pneumococcal Polysaccharide-23 02/05/2008, 06/03/2008  . Td 02/05/2008, 06/03/2008   Pertinent  Health Maintenance Due  Topic Date Due  . INFLUENZA VACCINE  09/05/2018  . DEXA SCAN  Completed  . PNA vac Low Risk Adult  Completed   Fall Risk  10/09/2017 09/23/2016 08/27/2016 05/30/2015 05/16/2015  Falls in the past year? Yes Yes Yes Yes No  Comment - - [facility] Telephone Survey: data to providers prior to load - -  Number falls in past yr: 2 or more 1 1 1  -  Comment - - [facility] Telephone Survey Actual Response = 1 - -  Injury with Fall? Yes No No Yes -  Comment broke R wrist - - - -  Risk Factor Category  - - - - -  Risk for fall due to : - - - History of fall(s);Impaired balance/gait;Impaired mobility -  Follow up - - - Falls evaluation completed;Education provided -   Functional Status Survey:    Vitals:   09/15/18 0954  BP: 125/64  Pulse: (!) 52  Resp: 18  Temp: 97.6 F (36.4 C)  SpO2: 92%  Weight: 136 lb 9.6 oz (62 kg)  Height: 4\' 8"  (1.422 m)   Body mass index is 30.63 kg/m. Physical Exam Vitals signs and nursing note reviewed.  Constitutional:      General: She is not in acute distress.    Appearance: Normal appearance. She is obese. She is not ill-appearing, toxic-appearing or diaphoretic.  HENT:     Head: Normocephalic and atraumatic.     Nose: Nose normal.     Mouth/Throat:     Mouth: Mucous membranes are moist.  Eyes:     Extraocular Movements: Extraocular movements intact.     Conjunctiva/sclera:  Conjunctivae normal.     Pupils: Pupils are equal, round, and reactive to light.  Neck:     Musculoskeletal: Normal range of motion and neck supple.  Cardiovascular:     Rate and Rhythm: Normal rate and regular rhythm.  Pulmonary:     Effort: Pulmonary effort is normal.     Breath sounds: No wheezing, rhonchi or rales.  Abdominal:     General: There is no distension.     Palpations: Abdomen is soft.     Tenderness: There is no abdominal tenderness. There is no right CVA tenderness, left CVA tenderness, guarding or rebound.  Musculoskeletal:     Right lower leg: Edema present.     Left lower leg: Edema present.     Comments: Trace edema BLE. W/c for mobility. Limited over head ROM of the R+L shoulders, R>L  Skin:  General: Skin is warm and dry.  Neurological:     General: No focal deficit present.     Mental Status: She is alert. Mental status is at baseline.     Cranial Nerves: No cranial nerve deficit.     Motor: No weakness.     Coordination: Coordination normal.     Gait: Gait abnormal.     Comments: Oriented to person and place  Psychiatric:        Mood and Affect: Mood normal.        Behavior: Behavior normal.        Thought Content: Thought content normal.     Labs reviewed: Recent Labs    09/19/17 0655 09/20/17 0330 09/21/17 0235  09/22/17 09/29/17 03/19/18 06/17/18  NA 142 143 143   < > 142 143 142 143  K 3.9 4.3 4.4   < > 4.9 4.5 4.3 4.0  CL 111 112* 112*  --   --   --   --   --   CO2 25 25 26   --   --   --   --   --   GLUCOSE 108* 129* 112*  --   --   --   --   --   BUN 23 23 22    < > 21 19 27* 27*  CREATININE 0.98 0.81 0.80   < > 0.76 0.96 1.1 1.0  CALCIUM 8.1* 8.1* 8.4*  --  8.6 9.0  --   --   MG 2.3 2.3 2.2  --   --   --   --   --    < > = values in this interval not displayed.   Recent Labs    09/29/17 03/19/18 06/17/18  AST 15 14 14   ALT 12 11 11   ALKPHOS 82  --  45  BILITOT 0.9  --   --   PROT 6.2  --   --   ALBUMIN 3.4  --   --    Recent  Labs    09/15/17 1907 09/16/17 0407  09/21/17 0235  09/22/17 09/29/17 03/19/18 06/17/18  WBC 16.6* 11.2*   < > 10.9*  --  10.4 9.1 6.0 5.4  NEUTROABS 14.0* 8.7*  --   --   --   --   --   --   --   HGB 12.7 11.4*   < > 8.9*   < > 9.5 10.4 12.3 12.9  HCT 40.3 37.2   < > 28.5*   < > 28.3 31 38 39  MCV 93.3 94.7   < > 96.3  --  91.3 93.1  --   --   PLT 252 251   < > 263  --   --   --  245 224   < > = values in this interval not displayed.   Lab Results  Component Value Date   TSH 1.62 05/11/2018   Lab Results  Component Value Date   HGBA1C 6.0 06/17/2018   Lab Results  Component Value Date   CHOL 211 (A) 02/14/2014   HDL 47 02/14/2014   LDLCALC 133 02/14/2014   TRIG 155 02/14/2014    Significant Diagnostic Results in last 30 days:  No results found.  Assessment/Plan Hypothyroid Stable, continue Levothyroxine 65mcg qd, last TSH 1.62 05/11/18  Osteoarthritis Multiple sites, R shoulder pain with limited over head ROM, continue Norco 5/325mg  1/2 tab tid po.   Constipation Stable, continue MiraLax qd  Depression, recurrent ([place])  Her mood is stable, continue Mirtazapine 7.5mg  qd, Escitalopram 20mg  qd  Edema Minimal edema BLE, continue Furosemide 20mg  qd  GERD (gastroesophageal reflux disease) Stable, continue Famotdine 20mg  qd  COPD (chronic obstructive pulmonary disease) (HCC) Stable, continue Advair 250/50 bid.      Family/ staff Communication: plan of care reviewed with the patient and charge nurse.   Labs/tests ordered:  none  Time spend 25 minutes.

## 2018-09-15 NOTE — Assessment & Plan Note (Signed)
Her mood is stable, continue Mirtazapine 7.5mg  qd, Escitalopram 20mg  qd

## 2018-09-16 DIAGNOSIS — M6281 Muscle weakness (generalized): Secondary | ICD-10-CM | POA: Diagnosis not present

## 2018-09-16 DIAGNOSIS — M818 Other osteoporosis without current pathological fracture: Secondary | ICD-10-CM | POA: Diagnosis not present

## 2018-09-16 DIAGNOSIS — R296 Repeated falls: Secondary | ICD-10-CM | POA: Diagnosis not present

## 2018-09-16 DIAGNOSIS — R293 Abnormal posture: Secondary | ICD-10-CM | POA: Diagnosis not present

## 2018-09-16 DIAGNOSIS — N3281 Overactive bladder: Secondary | ICD-10-CM | POA: Diagnosis not present

## 2018-09-16 DIAGNOSIS — Z9181 History of falling: Secondary | ICD-10-CM | POA: Diagnosis not present

## 2018-09-17 DIAGNOSIS — Z9181 History of falling: Secondary | ICD-10-CM | POA: Diagnosis not present

## 2018-09-17 DIAGNOSIS — R296 Repeated falls: Secondary | ICD-10-CM | POA: Diagnosis not present

## 2018-09-17 DIAGNOSIS — N3281 Overactive bladder: Secondary | ICD-10-CM | POA: Diagnosis not present

## 2018-09-17 DIAGNOSIS — M818 Other osteoporosis without current pathological fracture: Secondary | ICD-10-CM | POA: Diagnosis not present

## 2018-09-17 DIAGNOSIS — M6281 Muscle weakness (generalized): Secondary | ICD-10-CM | POA: Diagnosis not present

## 2018-09-17 DIAGNOSIS — R293 Abnormal posture: Secondary | ICD-10-CM | POA: Diagnosis not present

## 2018-09-21 DIAGNOSIS — R293 Abnormal posture: Secondary | ICD-10-CM | POA: Diagnosis not present

## 2018-09-21 DIAGNOSIS — M6281 Muscle weakness (generalized): Secondary | ICD-10-CM | POA: Diagnosis not present

## 2018-09-21 DIAGNOSIS — R296 Repeated falls: Secondary | ICD-10-CM | POA: Diagnosis not present

## 2018-09-21 DIAGNOSIS — N3281 Overactive bladder: Secondary | ICD-10-CM | POA: Diagnosis not present

## 2018-09-21 DIAGNOSIS — M818 Other osteoporosis without current pathological fracture: Secondary | ICD-10-CM | POA: Diagnosis not present

## 2018-09-21 DIAGNOSIS — Z9181 History of falling: Secondary | ICD-10-CM | POA: Diagnosis not present

## 2018-09-22 DIAGNOSIS — N3281 Overactive bladder: Secondary | ICD-10-CM | POA: Diagnosis not present

## 2018-09-22 DIAGNOSIS — R296 Repeated falls: Secondary | ICD-10-CM | POA: Diagnosis not present

## 2018-09-22 DIAGNOSIS — R293 Abnormal posture: Secondary | ICD-10-CM | POA: Diagnosis not present

## 2018-09-22 DIAGNOSIS — Z9181 History of falling: Secondary | ICD-10-CM | POA: Diagnosis not present

## 2018-09-22 DIAGNOSIS — M6281 Muscle weakness (generalized): Secondary | ICD-10-CM | POA: Diagnosis not present

## 2018-09-22 DIAGNOSIS — M818 Other osteoporosis without current pathological fracture: Secondary | ICD-10-CM | POA: Diagnosis not present

## 2018-09-23 DIAGNOSIS — M6281 Muscle weakness (generalized): Secondary | ICD-10-CM | POA: Diagnosis not present

## 2018-09-23 DIAGNOSIS — M818 Other osteoporosis without current pathological fracture: Secondary | ICD-10-CM | POA: Diagnosis not present

## 2018-09-23 DIAGNOSIS — N3281 Overactive bladder: Secondary | ICD-10-CM | POA: Diagnosis not present

## 2018-09-23 DIAGNOSIS — R296 Repeated falls: Secondary | ICD-10-CM | POA: Diagnosis not present

## 2018-09-23 DIAGNOSIS — R293 Abnormal posture: Secondary | ICD-10-CM | POA: Diagnosis not present

## 2018-09-23 DIAGNOSIS — Z9181 History of falling: Secondary | ICD-10-CM | POA: Diagnosis not present

## 2018-09-24 DIAGNOSIS — Z9181 History of falling: Secondary | ICD-10-CM | POA: Diagnosis not present

## 2018-09-24 DIAGNOSIS — R296 Repeated falls: Secondary | ICD-10-CM | POA: Diagnosis not present

## 2018-09-24 DIAGNOSIS — R293 Abnormal posture: Secondary | ICD-10-CM | POA: Diagnosis not present

## 2018-09-24 DIAGNOSIS — M6281 Muscle weakness (generalized): Secondary | ICD-10-CM | POA: Diagnosis not present

## 2018-09-24 DIAGNOSIS — M818 Other osteoporosis without current pathological fracture: Secondary | ICD-10-CM | POA: Diagnosis not present

## 2018-09-24 DIAGNOSIS — N3281 Overactive bladder: Secondary | ICD-10-CM | POA: Diagnosis not present

## 2018-09-28 DIAGNOSIS — R296 Repeated falls: Secondary | ICD-10-CM | POA: Diagnosis not present

## 2018-09-28 DIAGNOSIS — M6281 Muscle weakness (generalized): Secondary | ICD-10-CM | POA: Diagnosis not present

## 2018-09-28 DIAGNOSIS — Z9181 History of falling: Secondary | ICD-10-CM | POA: Diagnosis not present

## 2018-09-28 DIAGNOSIS — N3281 Overactive bladder: Secondary | ICD-10-CM | POA: Diagnosis not present

## 2018-09-28 DIAGNOSIS — R293 Abnormal posture: Secondary | ICD-10-CM | POA: Diagnosis not present

## 2018-09-28 DIAGNOSIS — M818 Other osteoporosis without current pathological fracture: Secondary | ICD-10-CM | POA: Diagnosis not present

## 2018-09-30 DIAGNOSIS — Z9181 History of falling: Secondary | ICD-10-CM | POA: Diagnosis not present

## 2018-09-30 DIAGNOSIS — N3281 Overactive bladder: Secondary | ICD-10-CM | POA: Diagnosis not present

## 2018-09-30 DIAGNOSIS — M818 Other osteoporosis without current pathological fracture: Secondary | ICD-10-CM | POA: Diagnosis not present

## 2018-09-30 DIAGNOSIS — R296 Repeated falls: Secondary | ICD-10-CM | POA: Diagnosis not present

## 2018-09-30 DIAGNOSIS — R293 Abnormal posture: Secondary | ICD-10-CM | POA: Diagnosis not present

## 2018-09-30 DIAGNOSIS — M6281 Muscle weakness (generalized): Secondary | ICD-10-CM | POA: Diagnosis not present

## 2018-10-01 DIAGNOSIS — M6281 Muscle weakness (generalized): Secondary | ICD-10-CM | POA: Diagnosis not present

## 2018-10-01 DIAGNOSIS — R296 Repeated falls: Secondary | ICD-10-CM | POA: Diagnosis not present

## 2018-10-01 DIAGNOSIS — M818 Other osteoporosis without current pathological fracture: Secondary | ICD-10-CM | POA: Diagnosis not present

## 2018-10-01 DIAGNOSIS — Z9181 History of falling: Secondary | ICD-10-CM | POA: Diagnosis not present

## 2018-10-01 DIAGNOSIS — N3281 Overactive bladder: Secondary | ICD-10-CM | POA: Diagnosis not present

## 2018-10-01 DIAGNOSIS — R293 Abnormal posture: Secondary | ICD-10-CM | POA: Diagnosis not present

## 2018-10-05 ENCOUNTER — Other Ambulatory Visit: Payer: Self-pay | Admitting: *Deleted

## 2018-10-05 DIAGNOSIS — R293 Abnormal posture: Secondary | ICD-10-CM | POA: Diagnosis not present

## 2018-10-05 DIAGNOSIS — Z9181 History of falling: Secondary | ICD-10-CM | POA: Diagnosis not present

## 2018-10-05 DIAGNOSIS — M6281 Muscle weakness (generalized): Secondary | ICD-10-CM | POA: Diagnosis not present

## 2018-10-05 DIAGNOSIS — R296 Repeated falls: Secondary | ICD-10-CM | POA: Diagnosis not present

## 2018-10-05 DIAGNOSIS — N3281 Overactive bladder: Secondary | ICD-10-CM | POA: Diagnosis not present

## 2018-10-05 DIAGNOSIS — M818 Other osteoporosis without current pathological fracture: Secondary | ICD-10-CM | POA: Diagnosis not present

## 2018-10-05 MED ORDER — HYDROCODONE-ACETAMINOPHEN 5-325 MG PO TABS: ORAL_TABLET | ORAL | 0 refills | Status: DC

## 2018-10-05 NOTE — Telephone Encounter (Signed)
Received Fax request from [facility] and sent to [facility] for approval.

## 2018-10-06 DIAGNOSIS — M818 Other osteoporosis without current pathological fracture: Secondary | ICD-10-CM | POA: Diagnosis not present

## 2018-10-06 DIAGNOSIS — M6281 Muscle weakness (generalized): Secondary | ICD-10-CM | POA: Diagnosis not present

## 2018-10-06 DIAGNOSIS — R293 Abnormal posture: Secondary | ICD-10-CM | POA: Diagnosis not present

## 2018-10-06 DIAGNOSIS — Z9181 History of falling: Secondary | ICD-10-CM | POA: Diagnosis not present

## 2018-10-06 DIAGNOSIS — R296 Repeated falls: Secondary | ICD-10-CM | POA: Diagnosis not present

## 2018-10-06 DIAGNOSIS — N3281 Overactive bladder: Secondary | ICD-10-CM | POA: Diagnosis not present

## 2018-10-06 DIAGNOSIS — R55 Syncope and collapse: Secondary | ICD-10-CM | POA: Diagnosis not present

## 2018-10-07 DIAGNOSIS — M6281 Muscle weakness (generalized): Secondary | ICD-10-CM | POA: Diagnosis not present

## 2018-10-07 DIAGNOSIS — M818 Other osteoporosis without current pathological fracture: Secondary | ICD-10-CM | POA: Diagnosis not present

## 2018-10-07 DIAGNOSIS — R296 Repeated falls: Secondary | ICD-10-CM | POA: Diagnosis not present

## 2018-10-07 DIAGNOSIS — Z9181 History of falling: Secondary | ICD-10-CM | POA: Diagnosis not present

## 2018-10-07 DIAGNOSIS — N3281 Overactive bladder: Secondary | ICD-10-CM | POA: Diagnosis not present

## 2018-10-07 DIAGNOSIS — R293 Abnormal posture: Secondary | ICD-10-CM | POA: Diagnosis not present

## 2018-10-08 DIAGNOSIS — M818 Other osteoporosis without current pathological fracture: Secondary | ICD-10-CM | POA: Diagnosis not present

## 2018-10-08 DIAGNOSIS — R293 Abnormal posture: Secondary | ICD-10-CM | POA: Diagnosis not present

## 2018-10-08 DIAGNOSIS — Z9181 History of falling: Secondary | ICD-10-CM | POA: Diagnosis not present

## 2018-10-08 DIAGNOSIS — N3281 Overactive bladder: Secondary | ICD-10-CM | POA: Diagnosis not present

## 2018-10-08 DIAGNOSIS — R296 Repeated falls: Secondary | ICD-10-CM | POA: Diagnosis not present

## 2018-10-08 DIAGNOSIS — M6281 Muscle weakness (generalized): Secondary | ICD-10-CM | POA: Diagnosis not present

## 2018-10-09 ENCOUNTER — Encounter: Payer: Self-pay | Admitting: Nurse Practitioner

## 2018-10-09 ENCOUNTER — Non-Acute Institutional Stay (SKILLED_NURSING_FACILITY): Payer: Medicare Other | Admitting: Nurse Practitioner

## 2018-10-09 DIAGNOSIS — J42 Unspecified chronic bronchitis: Secondary | ICD-10-CM | POA: Diagnosis not present

## 2018-10-09 DIAGNOSIS — F329 Major depressive disorder, single episode, unspecified: Secondary | ICD-10-CM | POA: Diagnosis not present

## 2018-10-09 DIAGNOSIS — M159 Polyosteoarthritis, unspecified: Secondary | ICD-10-CM

## 2018-10-09 DIAGNOSIS — K5900 Constipation, unspecified: Secondary | ICD-10-CM

## 2018-10-09 DIAGNOSIS — E039 Hypothyroidism, unspecified: Secondary | ICD-10-CM | POA: Diagnosis not present

## 2018-10-09 DIAGNOSIS — R609 Edema, unspecified: Secondary | ICD-10-CM

## 2018-10-09 DIAGNOSIS — K219 Gastro-esophageal reflux disease without esophagitis: Secondary | ICD-10-CM | POA: Diagnosis not present

## 2018-10-09 DIAGNOSIS — M150 Primary generalized (osteo)arthritis: Secondary | ICD-10-CM | POA: Diagnosis not present

## 2018-10-09 NOTE — Progress Notes (Signed)
Location:  [facility] Room Number: 25 Place of Service:  SNF (31) Provider:  Marlana Latus  NP  Arjuna Doeden X, NP  Patient Care Team: Geffrey Michaelsen X, NP as PCP - General (Internal Medicine) Newton Pigg, MD as Consulting Physician (Obstetrics and Gynecology) Monna Fam, MD as Consulting Physician (Ophthalmology) Melida Quitter, MD as Consulting Physician (Otolaryngology) Myrlene Broker, MD as Consulting Physician (Urology) Beggs, [facility] Virgie Dad, MD as Consulting Physician (Internal Medicine)  Extended Emergency Contact Information Primary Emergency Contact: Holderness,[name] Address: [place], [place] Phone: [phone] Work Phone: [phone] Mobile Phone: [phone] Relation: Son Secondary Emergency Contact: Teofilo [place] of [place] Phone: [phone] Work Phone: [phone] Relation: Friend  Code Status:  DNR Goals of care: Advanced Directive information Advanced Directives 10/09/2018  Does Patient Have a Medical Advance Directive? Yes  Type of Paramedic of [place];Living will;Out of facility DNR (pink MOST or yellow form)  Does patient want to make changes to medical advance directive? No - Patient declined  Copy of [place] in Chart? Yes - validated most recent copy scanned in chart (See row information)  Pre-existing out of facility DNR order (yellow form or pink MOST form) -     Chief Complaint  Patient presents with  . Medical Management of Chronic Issues    HPI:  Pt is a 85 y.o. female seen today for medical management of chronic diseases.   The [facility] in [facility] for safety, care assistance, w/c for mobility.  Hx of depression, not well controlled, reported thesta patient has less motivation, but she sleeps and eats at her baseline, weights are stable, on Mirtazapine 7.40m qd, Escitalopram  260mqd. Hx of constipation, stable, on MiraLax qd. Hypothyroidism, on Levothyroxine 2554mqd, last TSH 1.62 05/11/18. OA pain, multiple sits, stable, on Norco 5/325m61m2 tid. Chronic edema BLE, stable, on Furosemide 20mg62m COPD stable, on Advair bid. GERD, stable on Famotidine 20mg 53m   Past Medical History:  Diagnosis Date  . Asthma   . Carpal tunnel syndrome 03/07/2009  . Disturbance of skin sensation 02/21/2009  . Diverticulosis of colon (without mention of hemorrhage) 02/16/2009  . Fracture of upper end of left humerus 11/18/14  . 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 Hypothyroid   . Osteoarthrosis, unspecified whether generalized or localized, unspecified site 02/16/2009  . Other abnormal blood chemistry 06/19/2010   hyperglycemia  . Other and unspecified hyperlipidemia 10/24/2009  . Other atopic dermatitis and related conditions 02/16/2009  . Otosclerosis, unspecified 02/22/2003  . Pain in limb 12/05/2009  . Senile osteoporosis 06/16/2010  . Sensorineural hearing loss, unspecified 02/21/2009  . Syncope and collapse 05/30/2009  . Unspecified nasal polyp 02/21/2009  . Unspecified urinary incontinence 02/21/2009   Past Surgical History:  Procedure Laterality Date  . ABDOMINAL HYSTERECTOMY  1999   Dr. S. ForCollier BullockEAST BIOPSY  07/16/1990   benign left breast needle biopsy  Dr. StreckMargot ChimesRPAL TUNNEL RELEASE  04/07/2009   right Dr. SypherDaylene KatayamaTARACT EXTRACTION W/ INTRAOCULAR LENS  IMPLANT, BILATERAL Bilateral   . INTRAMEDULLARY (IM) NAIL INTERTROCHANTERIC Right 09/17/2017   Procedure: INTRAMEDULLARY (IM) NAIL INTERTROCHANTRIC HIP;  Surgeon: RogersNicholes Stairs Location: [facility] OR;[place]: Orthopedics;  Laterality: Right;  . OPEN REDUCTION INTERNAL FIXATION (ORIF) DISTAL RADIAL FRACTURE Right 09/17/2017  Procedure: OPEN REDUCTION INTERNAL FIXATION (ORIF) DISTAL RADIAL FRACTURE;  Surgeon: Iran Planas, MD;  Location: [facility];  Service: Orthopedics;  Laterality: Right;  . TRIGGER FINGER  RELEASE Left 08/09/2014   Procedure: RELEASE TRIGGER FINGER/A-1 PULLEY LEFT MIDDLE FINGER;  Surgeon: Daryll Brod, MD;  Location: [facility];  Service: Orthopedics;  Laterality: Left;  REGIONAL/FAB    Allergies  Allergen Reactions  . Sulfa Antibiotics Itching and Rash  . Amoxicillin Other (See Comments)    Per [facility] 09/15/17 Has patient had a PCN reaction causing immediate rash, facial/tongue/throat swelling, SOB or lightheadedness with hypotension: Unknown Has patient had a PCN reaction causing severe rash involving mucus membranes or skin necrosis: Unknown Has patient had a PCN reaction that required hospitalization: Unknown Has patient had a PCN reaction occurring within the last 10 years: Unknown If all of the above answers are "NO", then may proceed with Cephalosporin use.  . Avelox [Moxifloxacin Hcl In Nacl] Itching  . Doxycycline Other (See Comments)    Unknown reaction  . Hista-Tabs [Triprolidine-Pse] Other (See Comments)    Passed out  . Pyrilamine-Phenylephrine Other (See Comments)    Unknown reaction - per [facility] 09/15/17  . Septra [Sulfamethoxazole-Trimethoprim] Other (See Comments)    Unknown reaction    Outpatient Encounter Medications as of 10/09/2018  Medication Sig  . acetaminophen (TYLENOL) 325 MG tablet Take 650 mg by mouth every 6 (six) hours as needed.   Marland Kitchen albuterol (VENTOLIN HFA) 108 (90 Base) MCG/ACT inhaler Inhale 2 puffs into the lungs every 6 (six) hours as needed for shortness of breath (dyspnea).   Marland Kitchen aspirin EC 81 MG tablet Take 81 mg by mouth daily. On Tuesday and Saturday  . escitalopram (LEXAPRO) 20 MG tablet Take 20 mg by mouth daily.  . famotidine (PEPCID) 20 MG tablet Take 20 mg by mouth daily.  . feeding supplement (BOOST HIGH PROTEIN) LIQD Take 237 mLs by mouth 2 (two) times daily. Add ice cream to boost Plus twice a day  . Fluticasone-Salmeterol (ADVAIR) 250-50 MCG/DOSE AEPB Inhale 1 puff 2 (two) times daily into the lungs. Rinse mouth after use   . furosemide (LASIX) 20 MG tablet Take 20 mg by mouth daily. Hold for SBP < 110  . HYDROcodone-acetaminophen (NORCO/VICODIN) 5-325 MG tablet 1/2 tablet  By mouth TID (Lt shoulder)  . ipratropium (ATROVENT) 0.02 % nebulizer solution Take 0.5 mg by nebulization every 6 (six) hours as needed for wheezing or shortness of breath.  . levothyroxine (SYNTHROID, LEVOTHROID) 25 MCG tablet Take 25 mcg by mouth daily before breakfast.  . mirtazapine (REMERON) 15 MG tablet Take 7.5 mg by mouth daily.   . OXYGEN Inhale 2 L/min into the lungs as needed (to keep O2 SATS >90%).   . polyethylene glycol (MIRALAX / GLYCOLAX) packet Take 17 g by mouth daily.  . raloxifene (EVISTA) 60 MG tablet Take 1 tablet by mouth  daily to treat osteoporosis  . zinc oxide 20 % ointment Apply to buttocks as needed every shift after incontinent episodes  . [DISCONTINUED] HYDROcodone-acetaminophen (NORCO/VICODIN) 5-325 MG tablet Take 1/2 tablet by mouth at 6am, 7pm and 8pm  For right shoulder pain.   No facility-administered encounter medications on file as of 10/09/2018.    ROS was provided with assistance of staff.  Review of Systems  Constitutional: Negative for activity change, appetite change, chills, diaphoresis, fatigue, fever and unexpected weight change.  HENT: Positive for hearing loss. Negative for congestion and voice change.   Respiratory: Positive for cough.  Negative for shortness of breath and wheezing.   Cardiovascular: Negative for chest pain, palpitations and leg swelling.  Gastrointestinal: Negative for abdominal distention, abdominal pain, constipation, diarrhea, nausea and vomiting.  Genitourinary: Negative for difficulty urinating, dysuria and urgency.  Musculoskeletal: Positive for arthralgias and gait problem.  Skin: Negative for color change and pallor.  Neurological: Negative for dizziness, speech difficulty, weakness and headaches.       Memory lapses.   Psychiatric/Behavioral: Negative for agitation,  behavioral problems, hallucinations and sleep disturbance. The patient is not nervous/anxious.        The patient is reported lack of motivations    Immunization History  Administered Date(s) Administered  . Influenza Whole 12/27/2011, 11/05/2012  . Influenza-Unspecified 11/18/2013, 11/03/2014, 11/16/2015, 11/13/2016, 11/17/2017  . PPD Test 04/04/2008, 11/23/2014  . Pneumococcal Conjugate-13 09/30/2016  . Pneumococcal Polysaccharide-23 02/05/2008, 06/03/2008  . Td 02/05/2008, 06/03/2008   Pertinent  Health Maintenance Due  Topic Date Due  . INFLUENZA VACCINE  09/05/2018  . DEXA SCAN  Completed  . PNA vac Low Risk Adult  Completed   Fall Risk  10/09/2017 09/23/2016 08/27/2016 05/30/2015 05/16/2015  Falls in the past year? Yes Yes Yes Yes No  Comment - - Emmi Telephone Survey: data to providers prior to load - -  Number falls in past yr: 2 or more '1 1 1 ' -  Comment - - Emmi Telephone Survey Actual Response = 1 - -  Injury with Fall? Yes No No Yes -  Comment broke R wrist - - - -  Risk Factor Category  - - - - -  Risk for fall due to : - - - History of fall(s);Impaired balance/gait;Impaired mobility -  Follow up - - - Falls evaluation completed;Education provided -   Functional Status Survey:    Vitals:   10/09/18 1630  BP: (!) 142/66  Pulse: 64  Resp: 20  Temp: 97.9 F (36.6 C)  SpO2: 94%  Weight: 137 lb 4.8 oz (62.3 kg)  Height: '4\' 8"'  (1.422 m)   Body mass index is 30.78 kg/m. Physical Exam Vitals signs and nursing note reviewed.  Constitutional:      General: She is not in acute distress.    Appearance: Normal appearance. She is obese. She is not ill-appearing, toxic-appearing or diaphoretic.  HENT:     Head: Normocephalic and atraumatic.     Nose: Nose normal.     Mouth/Throat:     Mouth: Mucous membranes are moist.  Eyes:     Extraocular Movements: Extraocular movements intact.     Conjunctiva/sclera: Conjunctivae normal.     Pupils: Pupils are equal, round, and  reactive to light.  Neck:     Musculoskeletal: Normal range of motion and neck supple.  Cardiovascular:     Rate and Rhythm: Normal rate and regular rhythm.     Heart sounds: No murmur.  Pulmonary:     Breath sounds: No wheezing, rhonchi or rales.  Abdominal:     General: Bowel sounds are normal.     Palpations: Abdomen is soft.     Tenderness: There is no abdominal tenderness. There is no right CVA tenderness, left CVA tenderness, guarding or rebound.  Musculoskeletal:     Right lower leg: No edema.     Left lower leg: No edema.     Comments: Limited ROM of the right shoulder.   Skin:    General: Skin is warm and dry.  Neurological:     General: No focal deficit present.  Mental Status: She is alert. Mental status is at baseline.     Cranial Nerves: No cranial nerve deficit.     Motor: No weakness.     Coordination: Coordination normal.     Gait: Gait abnormal.     Comments: Oriented to person, place  Psychiatric:        Mood and Affect: Mood normal.        Behavior: Behavior normal.     Labs reviewed: Recent Labs    03/19/18 06/17/18  NA 142 143  K 4.3 4.0  BUN 27* 27*  CREATININE 1.1 1.0   Recent Labs    03/19/18 06/17/18  AST 14 14  ALT 11 11  ALKPHOS  --  45   Recent Labs    03/19/18 06/17/18  WBC 6.0 5.4  HGB 12.3 12.9  HCT 38 39  PLT 245 224   Lab Results  Component Value Date   TSH 1.62 05/11/2018   Lab Results  Component Value Date   HGBA1C 6.0 06/17/2018   Lab Results  Component Value Date   CHOL 211 (A) 02/14/2014   HDL 47 02/14/2014   LDLCALC 133 02/14/2014   TRIG 155 02/14/2014    Significant Diagnostic Results in last 30 days:  No results found.  Assessment/Plan Major depression, chronic Her mood is not well controlled, reported the patient has less motivation, but she sleeps and eats at her baseline, weights are stable, will increase  Mirtazapine 44m qd if Pt/HPOA consents, continue  Escitalopram 217mqd. Observe. Update  CBC/diff, CMP/eGFR, TSH.   Constipation Stable, continue MiraLax qd.   Edema Stable, continue Furosemide 2048md.   COPD (chronic obstructive pulmonary disease) (HCC) Stable, continue Advair bid.   GERD (gastroesophageal reflux disease) Stable, continue Famotidine 22m44m  Hypothyroid Stable, last TSH wnl 05/2018,  continue Levothyroxine 25mc50m.   Osteoarthritis Multiple sites, mainly in the left shoulder, continue Norco 5/325 1/2 tab tid.      Family/ staff Communication: plan of care reviewed with the patient and charge nurse.   Labs/tests ordered:  CBC/diff, CMP/eGFR, TSH  Time spend 25 minutes.

## 2018-10-12 DIAGNOSIS — R296 Repeated falls: Secondary | ICD-10-CM | POA: Diagnosis not present

## 2018-10-12 DIAGNOSIS — N3281 Overactive bladder: Secondary | ICD-10-CM | POA: Diagnosis not present

## 2018-10-12 DIAGNOSIS — R293 Abnormal posture: Secondary | ICD-10-CM | POA: Diagnosis not present

## 2018-10-12 DIAGNOSIS — M6281 Muscle weakness (generalized): Secondary | ICD-10-CM | POA: Diagnosis not present

## 2018-10-12 DIAGNOSIS — Z9181 History of falling: Secondary | ICD-10-CM | POA: Diagnosis not present

## 2018-10-12 DIAGNOSIS — M818 Other osteoporosis without current pathological fracture: Secondary | ICD-10-CM | POA: Diagnosis not present

## 2018-10-13 DIAGNOSIS — N3281 Overactive bladder: Secondary | ICD-10-CM | POA: Diagnosis not present

## 2018-10-13 DIAGNOSIS — M6281 Muscle weakness (generalized): Secondary | ICD-10-CM | POA: Diagnosis not present

## 2018-10-13 DIAGNOSIS — Z9181 History of falling: Secondary | ICD-10-CM | POA: Diagnosis not present

## 2018-10-13 DIAGNOSIS — M818 Other osteoporosis without current pathological fracture: Secondary | ICD-10-CM | POA: Diagnosis not present

## 2018-10-13 DIAGNOSIS — R296 Repeated falls: Secondary | ICD-10-CM | POA: Diagnosis not present

## 2018-10-13 DIAGNOSIS — R293 Abnormal posture: Secondary | ICD-10-CM | POA: Diagnosis not present

## 2018-10-14 DIAGNOSIS — M6281 Muscle weakness (generalized): Secondary | ICD-10-CM | POA: Diagnosis not present

## 2018-10-14 DIAGNOSIS — R293 Abnormal posture: Secondary | ICD-10-CM | POA: Diagnosis not present

## 2018-10-14 DIAGNOSIS — M818 Other osteoporosis without current pathological fracture: Secondary | ICD-10-CM | POA: Diagnosis not present

## 2018-10-14 DIAGNOSIS — N3281 Overactive bladder: Secondary | ICD-10-CM | POA: Diagnosis not present

## 2018-10-14 DIAGNOSIS — Z9181 History of falling: Secondary | ICD-10-CM | POA: Diagnosis not present

## 2018-10-14 DIAGNOSIS — R296 Repeated falls: Secondary | ICD-10-CM | POA: Diagnosis not present

## 2018-10-15 ENCOUNTER — Encounter: Payer: Self-pay | Admitting: Nurse Practitioner

## 2018-10-15 NOTE — Assessment & Plan Note (Signed)
Stable, continue Famotidine 20mg qd.  

## 2018-10-15 NOTE — Assessment & Plan Note (Signed)
Multiple sites, mainly in the left shoulder, continue Norco 5/325 1/2 tab tid.

## 2018-10-15 NOTE — Assessment & Plan Note (Signed)
Stable, continue Furosemide 20mg qd.  

## 2018-10-15 NOTE — Assessment & Plan Note (Signed)
Her mood is not well controlled, reported the patient has less motivation, but she sleeps and eats at her baseline, weights are stable, will increase  Mirtazapine 30m qd if Pt/HPOA consents, continue  Escitalopram 241mqd. Observe. Update CBC/diff, CMP/eGFR, TSH.

## 2018-10-15 NOTE — Assessment & Plan Note (Signed)
Stable, continue Advair bid 

## 2018-10-15 NOTE — Assessment & Plan Note (Addendum)
Stable, last TSH wnl 05/2018,  continue Levothyroxine 3mcg qd.

## 2018-10-15 NOTE — Assessment & Plan Note (Signed)
Stable, continue MiraLax qd.  

## 2018-10-16 DIAGNOSIS — R296 Repeated falls: Secondary | ICD-10-CM | POA: Diagnosis not present

## 2018-10-16 DIAGNOSIS — M818 Other osteoporosis without current pathological fracture: Secondary | ICD-10-CM | POA: Diagnosis not present

## 2018-10-16 DIAGNOSIS — M6281 Muscle weakness (generalized): Secondary | ICD-10-CM | POA: Diagnosis not present

## 2018-10-16 DIAGNOSIS — R293 Abnormal posture: Secondary | ICD-10-CM | POA: Diagnosis not present

## 2018-10-16 DIAGNOSIS — N3281 Overactive bladder: Secondary | ICD-10-CM | POA: Diagnosis not present

## 2018-10-16 DIAGNOSIS — Z9181 History of falling: Secondary | ICD-10-CM | POA: Diagnosis not present

## 2018-10-19 DIAGNOSIS — M818 Other osteoporosis without current pathological fracture: Secondary | ICD-10-CM | POA: Diagnosis not present

## 2018-10-19 DIAGNOSIS — R293 Abnormal posture: Secondary | ICD-10-CM | POA: Diagnosis not present

## 2018-10-19 DIAGNOSIS — R296 Repeated falls: Secondary | ICD-10-CM | POA: Diagnosis not present

## 2018-10-19 DIAGNOSIS — N3281 Overactive bladder: Secondary | ICD-10-CM | POA: Diagnosis not present

## 2018-10-19 DIAGNOSIS — Z9181 History of falling: Secondary | ICD-10-CM | POA: Diagnosis not present

## 2018-10-19 DIAGNOSIS — F0390 Unspecified dementia without behavioral disturbance: Secondary | ICD-10-CM | POA: Diagnosis not present

## 2018-10-19 DIAGNOSIS — D649 Anemia, unspecified: Secondary | ICD-10-CM | POA: Diagnosis not present

## 2018-10-19 DIAGNOSIS — M6281 Muscle weakness (generalized): Secondary | ICD-10-CM | POA: Diagnosis not present

## 2018-10-19 DIAGNOSIS — E039 Hypothyroidism, unspecified: Secondary | ICD-10-CM | POA: Diagnosis not present

## 2018-10-21 DIAGNOSIS — M6281 Muscle weakness (generalized): Secondary | ICD-10-CM | POA: Diagnosis not present

## 2018-10-21 DIAGNOSIS — Z9181 History of falling: Secondary | ICD-10-CM | POA: Diagnosis not present

## 2018-10-21 DIAGNOSIS — M818 Other osteoporosis without current pathological fracture: Secondary | ICD-10-CM | POA: Diagnosis not present

## 2018-10-21 DIAGNOSIS — N3281 Overactive bladder: Secondary | ICD-10-CM | POA: Diagnosis not present

## 2018-10-21 DIAGNOSIS — R293 Abnormal posture: Secondary | ICD-10-CM | POA: Diagnosis not present

## 2018-10-21 DIAGNOSIS — R296 Repeated falls: Secondary | ICD-10-CM | POA: Diagnosis not present

## 2018-10-23 DIAGNOSIS — Z9181 History of falling: Secondary | ICD-10-CM | POA: Diagnosis not present

## 2018-10-23 DIAGNOSIS — R293 Abnormal posture: Secondary | ICD-10-CM | POA: Diagnosis not present

## 2018-10-23 DIAGNOSIS — M818 Other osteoporosis without current pathological fracture: Secondary | ICD-10-CM | POA: Diagnosis not present

## 2018-10-23 DIAGNOSIS — N3281 Overactive bladder: Secondary | ICD-10-CM | POA: Diagnosis not present

## 2018-10-23 DIAGNOSIS — R296 Repeated falls: Secondary | ICD-10-CM | POA: Diagnosis not present

## 2018-10-23 DIAGNOSIS — M6281 Muscle weakness (generalized): Secondary | ICD-10-CM | POA: Diagnosis not present

## 2018-10-26 DIAGNOSIS — R296 Repeated falls: Secondary | ICD-10-CM | POA: Diagnosis not present

## 2018-10-26 DIAGNOSIS — N3281 Overactive bladder: Secondary | ICD-10-CM | POA: Diagnosis not present

## 2018-10-26 DIAGNOSIS — Z9181 History of falling: Secondary | ICD-10-CM | POA: Diagnosis not present

## 2018-10-26 DIAGNOSIS — R293 Abnormal posture: Secondary | ICD-10-CM | POA: Diagnosis not present

## 2018-10-26 DIAGNOSIS — M6281 Muscle weakness (generalized): Secondary | ICD-10-CM | POA: Diagnosis not present

## 2018-10-26 DIAGNOSIS — M818 Other osteoporosis without current pathological fracture: Secondary | ICD-10-CM | POA: Diagnosis not present

## 2018-10-28 DIAGNOSIS — R293 Abnormal posture: Secondary | ICD-10-CM | POA: Diagnosis not present

## 2018-10-28 DIAGNOSIS — Z9181 History of falling: Secondary | ICD-10-CM | POA: Diagnosis not present

## 2018-10-28 DIAGNOSIS — R296 Repeated falls: Secondary | ICD-10-CM | POA: Diagnosis not present

## 2018-10-28 DIAGNOSIS — N3281 Overactive bladder: Secondary | ICD-10-CM | POA: Diagnosis not present

## 2018-10-28 DIAGNOSIS — M6281 Muscle weakness (generalized): Secondary | ICD-10-CM | POA: Diagnosis not present

## 2018-10-28 DIAGNOSIS — M818 Other osteoporosis without current pathological fracture: Secondary | ICD-10-CM | POA: Diagnosis not present

## 2018-10-30 DIAGNOSIS — R296 Repeated falls: Secondary | ICD-10-CM | POA: Diagnosis not present

## 2018-10-30 DIAGNOSIS — S42291A Other displaced fracture of upper end of right humerus, initial encounter for closed fracture: Secondary | ICD-10-CM | POA: Diagnosis not present

## 2018-10-30 DIAGNOSIS — R293 Abnormal posture: Secondary | ICD-10-CM | POA: Diagnosis not present

## 2018-10-30 DIAGNOSIS — M818 Other osteoporosis without current pathological fracture: Secondary | ICD-10-CM | POA: Diagnosis not present

## 2018-10-30 DIAGNOSIS — N3281 Overactive bladder: Secondary | ICD-10-CM | POA: Diagnosis not present

## 2018-10-30 DIAGNOSIS — Z9181 History of falling: Secondary | ICD-10-CM | POA: Diagnosis not present

## 2018-10-30 DIAGNOSIS — M6281 Muscle weakness (generalized): Secondary | ICD-10-CM | POA: Diagnosis not present

## 2018-11-02 DIAGNOSIS — R296 Repeated falls: Secondary | ICD-10-CM | POA: Diagnosis not present

## 2018-11-02 DIAGNOSIS — M6281 Muscle weakness (generalized): Secondary | ICD-10-CM | POA: Diagnosis not present

## 2018-11-02 DIAGNOSIS — R293 Abnormal posture: Secondary | ICD-10-CM | POA: Diagnosis not present

## 2018-11-02 DIAGNOSIS — M818 Other osteoporosis without current pathological fracture: Secondary | ICD-10-CM | POA: Diagnosis not present

## 2018-11-02 DIAGNOSIS — N3281 Overactive bladder: Secondary | ICD-10-CM | POA: Diagnosis not present

## 2018-11-02 DIAGNOSIS — Z9181 History of falling: Secondary | ICD-10-CM | POA: Diagnosis not present

## 2018-11-03 DIAGNOSIS — M6281 Muscle weakness (generalized): Secondary | ICD-10-CM | POA: Diagnosis not present

## 2018-11-03 DIAGNOSIS — R293 Abnormal posture: Secondary | ICD-10-CM | POA: Diagnosis not present

## 2018-11-03 DIAGNOSIS — Z9181 History of falling: Secondary | ICD-10-CM | POA: Diagnosis not present

## 2018-11-03 DIAGNOSIS — N3281 Overactive bladder: Secondary | ICD-10-CM | POA: Diagnosis not present

## 2018-11-03 DIAGNOSIS — M818 Other osteoporosis without current pathological fracture: Secondary | ICD-10-CM | POA: Diagnosis not present

## 2018-11-03 DIAGNOSIS — R296 Repeated falls: Secondary | ICD-10-CM | POA: Diagnosis not present

## 2018-11-04 DIAGNOSIS — N3281 Overactive bladder: Secondary | ICD-10-CM | POA: Diagnosis not present

## 2018-11-04 DIAGNOSIS — M6281 Muscle weakness (generalized): Secondary | ICD-10-CM | POA: Diagnosis not present

## 2018-11-04 DIAGNOSIS — Z9181 History of falling: Secondary | ICD-10-CM | POA: Diagnosis not present

## 2018-11-04 DIAGNOSIS — R296 Repeated falls: Secondary | ICD-10-CM | POA: Diagnosis not present

## 2018-11-04 DIAGNOSIS — R293 Abnormal posture: Secondary | ICD-10-CM | POA: Diagnosis not present

## 2018-11-04 DIAGNOSIS — M818 Other osteoporosis without current pathological fracture: Secondary | ICD-10-CM | POA: Diagnosis not present

## 2018-11-06 ENCOUNTER — Other Ambulatory Visit: Payer: Self-pay | Admitting: *Deleted

## 2018-11-06 MED ORDER — HYDROCODONE-ACETAMINOPHEN 5-325 MG PO TABS: ORAL_TABLET | ORAL | 0 refills | Status: DC

## 2018-11-06 NOTE — Telephone Encounter (Signed)
Kimberly Greer medical  Pended Rx and sent to [facility] for approval.

## 2018-11-11 ENCOUNTER — Encounter: Payer: Self-pay | Admitting: Internal Medicine

## 2018-11-11 ENCOUNTER — Non-Acute Institutional Stay (SKILLED_NURSING_FACILITY): Payer: Medicare Other | Admitting: Internal Medicine

## 2018-11-11 DIAGNOSIS — J310 Chronic rhinitis: Secondary | ICD-10-CM

## 2018-11-11 DIAGNOSIS — E039 Hypothyroidism, unspecified: Secondary | ICD-10-CM

## 2018-11-11 DIAGNOSIS — M810 Age-related osteoporosis without current pathological fracture: Secondary | ICD-10-CM

## 2018-11-11 DIAGNOSIS — R001 Bradycardia, unspecified: Secondary | ICD-10-CM | POA: Diagnosis not present

## 2018-11-11 DIAGNOSIS — J42 Unspecified chronic bronchitis: Secondary | ICD-10-CM | POA: Diagnosis not present

## 2018-11-11 NOTE — Progress Notes (Signed)
Location:    [facility]ursing Home Room Number: 25 Place of Service:  SNF (31) Provider:  Veleta Miners MD  Mast, Man X, NP  Patient Care Team: Mast, Man X, NP as PCP - General (Internal Medicine) Newton Pigg, MD as Consulting Physician (Obstetrics and Gynecology) Monna Fam, MD as Consulting Physician (Ophthalmology) Melida Quitter, MD as Consulting Physician (Otolaryngology) Myrlene Broker, MD as Consulting Physician (Urology) [place], [facility] Virgie Dad, MD as Consulting Physician (Internal Medicine)  Extended Emergency Contact Information Primary Emergency Contact: Hage,[name] Address: [place], [place] Phone: [phone] Work Phone: [phone] Mobile Phone: [phone] Relation: Son Secondary Emergency Contact: Teofilo [place] Phone: [phone] Work Phone: [phone] Relation: Friend  Code Status:  DNR Goals of care: Advanced Directive information Advanced Directives 11/11/2018  Does Patient Have a Medical Advance Directive? Yes  Type of Paramedic of [place];Living will;Out of facility DNR (pink MOST or yellow form)  Does patient want to make changes to medical advance directive? No - Patient declined  Copy of [place] in Chart? Yes - validated most recent copy scanned in chart (See row information)  Pre-existing out of facility DNR order (yellow form or pink MOST form) -     Chief Complaint  Patient presents with  . Medical Management of Chronic Issues  . Health Maintenance    TDAP, Influenza vaccine    HPI:  Pt is a 85 y.o. female seen today for medical management of chronic diseases.    Patient has h/o Sinus Bradycardia, LE edema, Hypothyroidism, Depression, Chronic Sinusitis, H/o Hip Fracture,And recent Proximal Humerus Fracture due to Fall  . Patient recently seen by Ortho.Xray Showed delayed healing.   Patient does not have any restriction of mobility or   pain  Patient did complain that she feels sleepy most of the time.  She is on Norco standing order 3 times a day Per nurses she has not c/o Pain Patient is also complaining of rhinorrhea. Her weight is stable No issues  Past Medical History:  Diagnosis Date  . Asthma   . Carpal tunnel syndrome 03/07/2009  . Disturbance of skin sensation 02/21/2009  . Diverticulosis of colon (without mention of hemorrhage) 02/16/2009  . Fracture of upper end of left humerus 11/18/14  . 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 Hypothyroid   . Osteoarthrosis, unspecified whether generalized or localized, unspecified site 02/16/2009  . Other abnormal blood chemistry 06/19/2010   hyperglycemia  . Other and unspecified hyperlipidemia 10/24/2009  . Other atopic dermatitis and related conditions 02/16/2009  . Otosclerosis, unspecified 02/22/2003  . Pain in limb 12/05/2009  . Senile osteoporosis 06/16/2010  . Sensorineural hearing loss, unspecified 02/21/2009  . Syncope and collapse 05/30/2009  . Unspecified nasal polyp 02/21/2009  . Unspecified urinary incontinence 02/21/2009   Past Surgical History:  Procedure Laterality Date  . ABDOMINAL HYSTERECTOMY  1999   Dr. Collier Bullock  . BREAST BIOPSY  07/16/1990   benign left breast needle biopsy  Dr. Margot Chimes  . CARPAL TUNNEL RELEASE  04/07/2009   right Dr. Daylene Katayama  . CATARACT EXTRACTION W/ INTRAOCULAR LENS  IMPLANT, BILATERAL Bilateral   . INTRAMEDULLARY (IM) NAIL INTERTROCHANTERIC Right 09/17/2017   Procedure: INTRAMEDULLARY (IM) NAIL INTERTROCHANTRIC HIP;  Surgeon: Nicholes Stairs, MD;  Location: [place];  Service: Orthopedics;  Laterality: Right;  . OPEN REDUCTION INTERNAL FIXATION (ORIF) DISTAL  RADIAL FRACTURE Right 09/17/2017   Procedure: OPEN REDUCTION INTERNAL FIXATION (ORIF) DISTAL RADIAL FRACTURE;  Surgeon: Iran Planas, MD;  Location: [place];  Service: Orthopedics;  Laterality: Right;  . TRIGGER FINGER RELEASE Left  08/09/2014   Procedure: RELEASE TRIGGER FINGER/A-1 PULLEY LEFT MIDDLE FINGER;  Surgeon: Daryll Brod, MD;  Location: [place];  Service: Orthopedics;  Laterality: Left;  REGIONAL/FAB    Allergies  Allergen Reactions  . Sulfa Antibiotics Itching and Rash  . Amoxicillin Other (See Comments)    Per [facility] 09/15/17 Has patient had a PCN reaction causing immediate rash, facial/tongue/throat swelling, SOB or lightheadedness with hypotension: Unknown Has patient had a PCN reaction causing severe rash involving mucus membranes or skin necrosis: Unknown Has patient had a PCN reaction that required hospitalization: Unknown Has patient had a PCN reaction occurring within the last 10 years: Unknown If all of the above answers are "NO", then may proceed with Cephalosporin use.  . Avelox [Moxifloxacin Hcl In Nacl] Itching  . Doxycycline Other (See Comments)    Unknown reaction  . Hista-Tabs [Triprolidine-Pse] Other (See Comments)    Passed out  . Pyrilamine-Phenylephrine Other (See Comments)    Unknown reaction - per [facility] 09/15/17  . Septra [Sulfamethoxazole-Trimethoprim] Other (See Comments)    Unknown reaction    Allergies as of 11/11/2018      Reactions   Sulfa Antibiotics Itching, Rash   Amoxicillin Other (See Comments)   Per [facility] 09/15/17 Has patient had a PCN reaction causing immediate rash, facial/tongue/throat swelling, SOB or lightheadedness with hypotension: Unknown Has patient had a PCN reaction causing severe rash involving mucus membranes or skin necrosis: Unknown Has patient had a PCN reaction that required hospitalization: Unknown Has patient had a PCN reaction occurring within the last 10 years: Unknown If all of the above answers are "NO", then may proceed with Cephalosporin use.   Avelox [moxifloxacin Hcl In Nacl] Itching   Doxycycline Other (See Comments)   Unknown reaction   Hista-tabs [triprolidine-pse] Other (See Comments)   Passed out   Pyrilamine-phenylephrine  Other (See Comments)   Unknown reaction - per [facility] 09/15/17   Septra [sulfamethoxazole-trimethoprim] Other (See Comments)   Unknown reaction      Medication List       Accurate as of November 11, 2018  9:25 PM. If you have any questions, ask your nurse or doctor.        acetaminophen 325 MG tablet Commonly known as: TYLENOL Take 650 mg by mouth every 6 (six) hours as needed.   aspirin EC 81 MG tablet Take 81 mg by mouth daily. On Tuesday and Saturday   escitalopram 20 MG tablet Commonly known as: LEXAPRO Take 20 mg by mouth daily.   famotidine 20 MG tablet Commonly known as: PEPCID Take 20 mg by mouth daily.   feeding supplement Liqd Take 237 mLs by mouth 2 (two) times daily. Add ice cream to boost Plus twice a day   Fluticasone-Salmeterol 250-50 MCG/DOSE Aepb Commonly known as: ADVAIR Inhale 1 puff 2 (two) times daily into the lungs. Rinse mouth after use   furosemide 20 MG tablet Commonly known as: LASIX Take 20 mg by mouth daily. Hold for SBP < 110   HYDROcodone-acetaminophen 5-325 MG tablet Commonly known as: NORCO/VICODIN Take 1/2 tablet by mouth three times daily at 6am,7pm, and 8pm for right shoulder pain   ipratropium 0.02 % nebulizer solution Commonly known as: ATROVENT Take 0.5 mg by nebulization every 6 (six) hours as needed for  wheezing or shortness of breath.   levothyroxine 25 MCG tablet Commonly known as: SYNTHROID Take 25 mcg by mouth daily before breakfast.   mirtazapine 15 MG tablet Commonly known as: REMERON Take 15 mg by mouth daily.   OXYGEN Inhale 2 L/min into the lungs as needed (to keep O2 SATS >90%).   polyethylene glycol 17 g packet Commonly known as: MIRALAX / GLYCOLAX Take 17 g by mouth daily.   raloxifene 60 MG tablet Commonly known as: EVISTA Take 1 tablet by mouth  daily to treat osteoporosis   Ventolin HFA 108 (90 Base) MCG/ACT inhaler Generic drug: albuterol Inhale 2 puffs into the lungs every 6 (six) hours as needed for  shortness of breath (dyspnea).   zinc oxide 20 % ointment Apply to buttocks as needed every shift after incontinent episodes       Review of Systems  Constitutional: Negative.   HENT: Positive for congestion and rhinorrhea.   Respiratory: Negative.   Cardiovascular: Positive for leg swelling.  Gastrointestinal: Negative.   Genitourinary: Negative.   Musculoskeletal: Negative.   Neurological: Positive for weakness.  Psychiatric/Behavioral: Negative.   All other systems reviewed and are negative.   Immunization History  Administered Date(s) Administered  . Influenza Whole 12/27/2011, 11/05/2012  . Influenza-Unspecified 11/18/2013, 11/03/2014, 11/16/2015, 11/13/2016, 11/17/2017  . PPD Test 04/04/2008, 11/23/2014  . Pneumococcal Conjugate-13 09/30/2016  . Pneumococcal Polysaccharide-23 02/05/2008, 06/03/2008  . Td 02/05/2008, 06/03/2008   Pertinent  Health Maintenance Due  Topic Date Due  . INFLUENZA VACCINE  09/05/2018  . DEXA SCAN  Completed  . PNA vac Low Risk Adult  Completed   Fall Risk  10/09/2017 09/23/2016 08/27/2016 05/30/2015 05/16/2015  Falls in the past year? Yes Yes Yes Yes No  Comment - - Emmi Telephone Survey: data to providers prior to load - -  Number falls in past yr: 2 or more 1 1 1  -  Comment - - Emmi Telephone Survey Actual Response = 1 - -  Injury with Fall? Yes No No Yes -  Comment broke R wrist - - - -  Risk Factor Category  - - - - -  Risk for fall due to : - - - History of fall(s);Impaired balance/gait;Impaired mobility -  Follow up - - - Falls evaluation completed;Education provided -   Functional Status Survey:    Vitals:   11/11/18 1001  BP: 129/68  Pulse: (!) 51  Resp: 18  Temp: (!) 96.3 F (35.7 C)  SpO2: 92%  Weight: 137 lb 3.2 oz (62.2 kg)  Height: 4\' 8"  (1.422 m)   Body mass index is 30.76 kg/m. Physical Exam Vitals signs reviewed.  Constitutional:      Appearance: Normal appearance.  HENT:     Head: Normocephalic.     Nose:  Congestion and rhinorrhea present.     Comments: No Sinus Tenderness    Mouth/Throat:     Mouth: Mucous membranes are moist.     Pharynx: Oropharynx is clear.  Eyes:     Pupils: Pupils are equal, round, and reactive to light.  Neck:     Musculoskeletal: Neck supple.  Cardiovascular:     Rate and Rhythm: Normal rate and regular rhythm.     Pulses: Normal pulses.  Pulmonary:     Effort: Pulmonary effort is normal.     Breath sounds: Normal breath sounds.  Abdominal:     General: Abdomen is flat. Bowel sounds are normal.     Palpations: Abdomen is soft.  Musculoskeletal:  General: Swelling present.     Comments: No pain in Right UE  Skin:    General: Skin is warm and dry.  Neurological:     General: No focal deficit present.     Mental Status: She is alert.  Psychiatric:        Mood and Affect: Mood normal.        Thought Content: Thought content normal.     Labs reviewed: Recent Labs    03/19/18 06/17/18  NA 142 143  K 4.3 4.0  BUN 27* 27*  CREATININE 1.1 1.0   Recent Labs    03/19/18 06/17/18  AST 14 14  ALT 11 11  ALKPHOS  --  45   Recent Labs    03/19/18 06/17/18  WBC 6.0 5.4  HGB 12.3 12.9  HCT 38 39  PLT 245 224   Lab Results  Component Value Date   TSH 1.62 05/11/2018   Lab Results  Component Value Date   HGBA1C 6.0 06/17/2018   Lab Results  Component Value Date   CHOL 211 (A) 02/14/2014   HDL 47 02/14/2014   LDLCALC 133 02/14/2014   TRIG 155 02/14/2014    Significant Diagnostic Results in last 30 days:  No results found.  Assessment/Plan Closed fracture of Proximal end of right radius,  Per ortho did not heal well but no pain or any Movement restriction Will change Norco to PRN as her pain seems controlled and patient c/o Sleepiness  Chronic rhinitis Will restart her on Flonase for 4 weeks  Chronic bronchitis,  Contineu Advair  Sinus bradycardia Asymptomatic  Acquired hypothyroidism TSh normal in 4/20 Age-related  osteoporosis  On Evista Iron deficiency anemia, Hgb was normal in 09/20  Major depression Patient stable on Remeron and Lexapro Not candidate for GDR  LEEdema, Stable on Lasix BUN and Creat stable  Labs done in 9/20 were Normal Family/ staff Communication:   Labs/tests ordered:    Total time spent in this patient care encounter was  25_  minutes; greater than 50% of the visit spent counseling patient and staff, reviewing records , Labs and coordinating care for problems addressed at this encounter.

## 2018-11-12 DIAGNOSIS — Z20828 Contact with and (suspected) exposure to other viral communicable diseases: Secondary | ICD-10-CM | POA: Diagnosis not present

## 2018-12-10 DIAGNOSIS — Z20828 Contact with and (suspected) exposure to other viral communicable diseases: Secondary | ICD-10-CM | POA: Diagnosis not present

## 2018-12-17 DIAGNOSIS — Z20828 Contact with and (suspected) exposure to other viral communicable diseases: Secondary | ICD-10-CM | POA: Diagnosis not present

## 2018-12-17 IMAGING — CR DG WRIST COMPLETE 3+V*R*
4 series · 4 of 4 positions shown · non-contrast
Comparison: None.

CLINICAL DATA: Fall with wrist deformity

EXAM:
RIGHT WRIST - COMPLETE 3+ VIEW

[wrist pa]
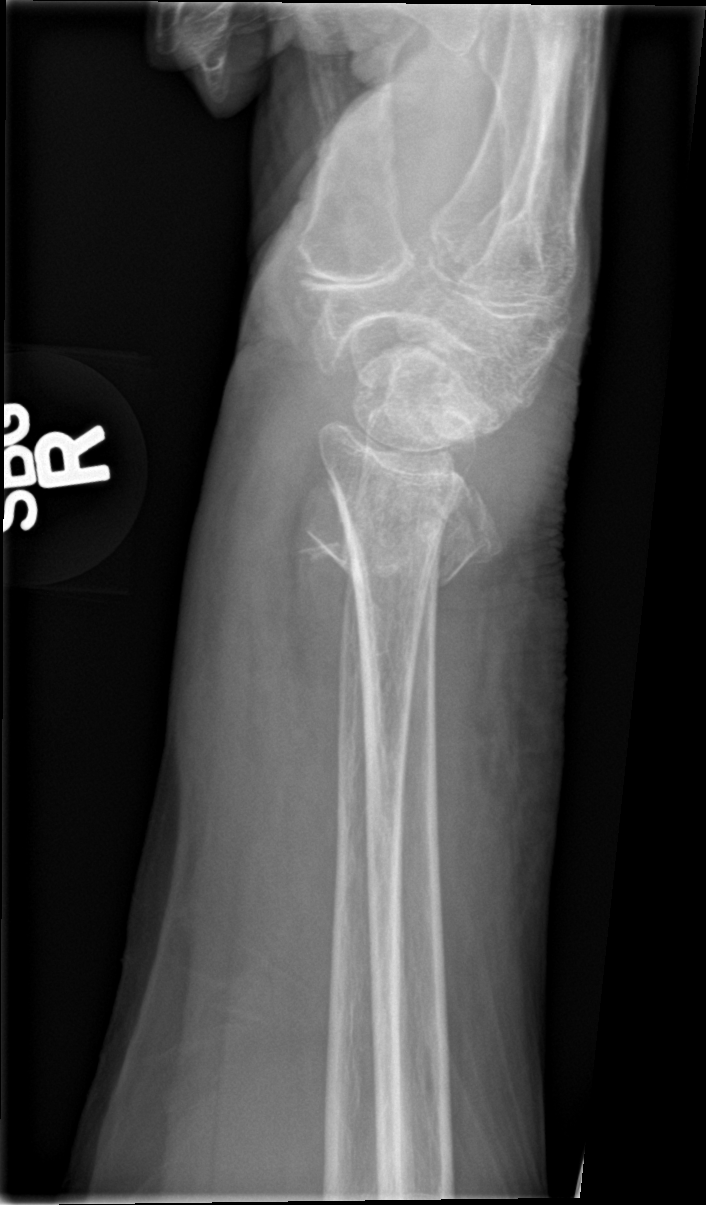

[wrist obl]
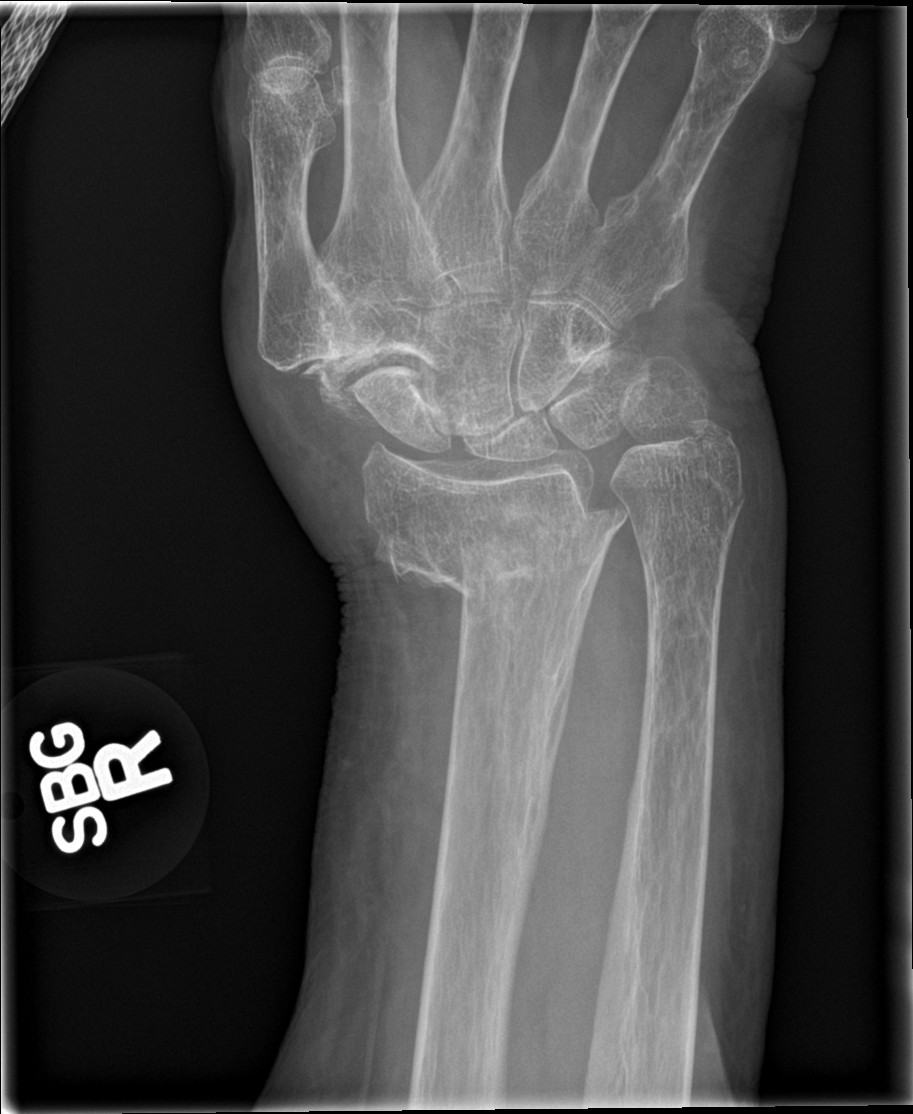

[wrist lat]
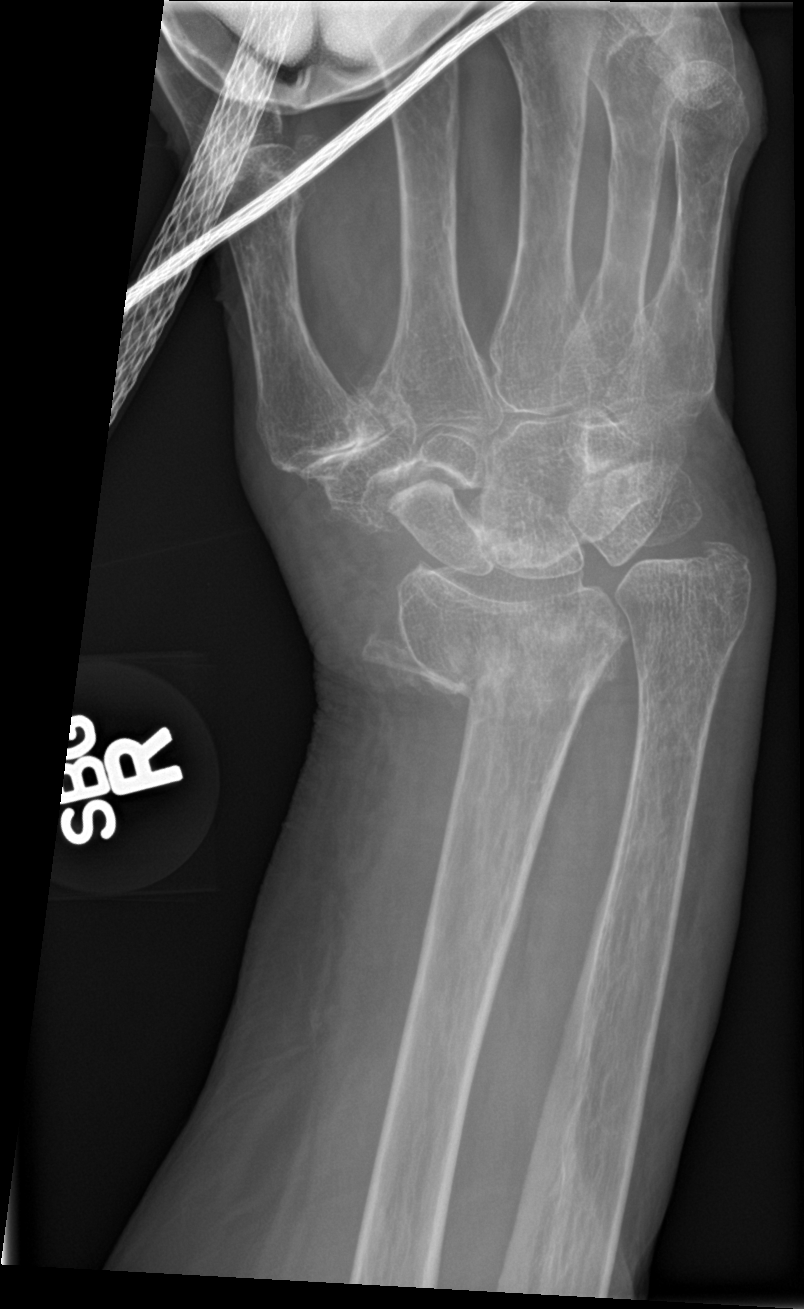

[wrist navicular]
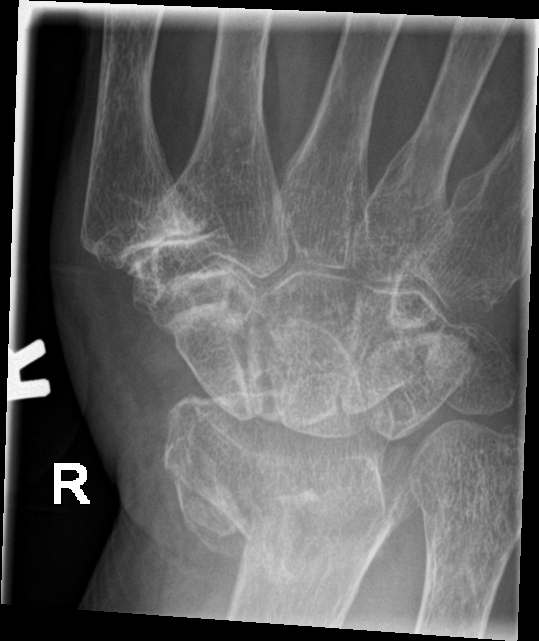

[4 of 4 positions shown; findings below may reference images not displayed]

FINDINGS: There is a comminuted, predominantly transverse fracture of the
distal right radius with moderate lateral displacement. There is
also a minimally displaced fracture of the distal left ulna,
involving the ulnar styloid. There is severe first carpometacarpal
joint osteoarthrosis. No carpal fracture is identified.
IMPRESSION: 1. Comminuted fracture of the distal right radius with lateral
displacement.
2. Nondisplaced fracture of the distal left ulna.
3. Severe first CMC joint osteoarthrosis.

## 2018-12-17 IMAGING — CT CT HEAD W/O CM
3 series · 15 of 47 positions shown, 18 images · non-contrast
Comparison: 11/06/2014, 04/25/2011, 09/11/2006.

CLINICAL DATA: [AGE] who fell in her bathroom earlier today
and was unable to get up on her own. It is unclear how long the
patient was on the floor. Initial encounter.

EXAM:
CT HEAD WITHOUT CONTRAST
TECHNIQUE: Contiguous axial images were obtained from the base of the skull
through the vertex without intravenous contrast.

[Series 3: head 5.0 h30s · axial · 0.42mm/px · z∈[-77,+58]mm · 9 of 33 slices shown, 12 images]
[im 3/33  brain]
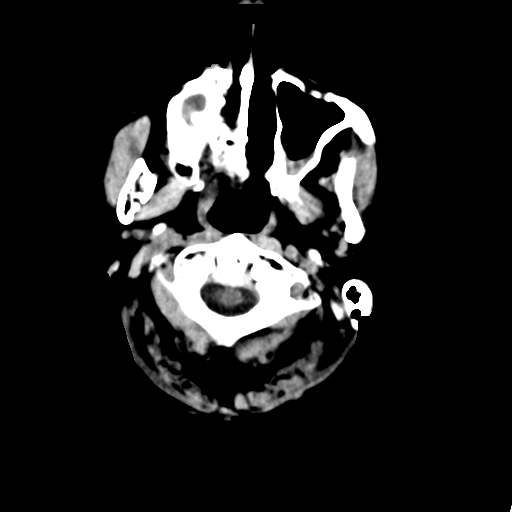
[im 3/33  bone]
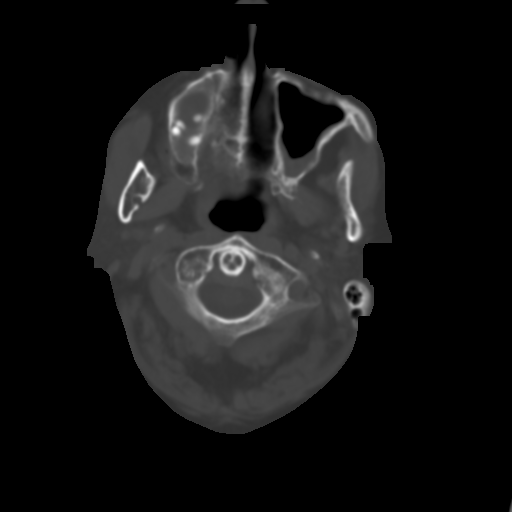
[im 6/33  brain]
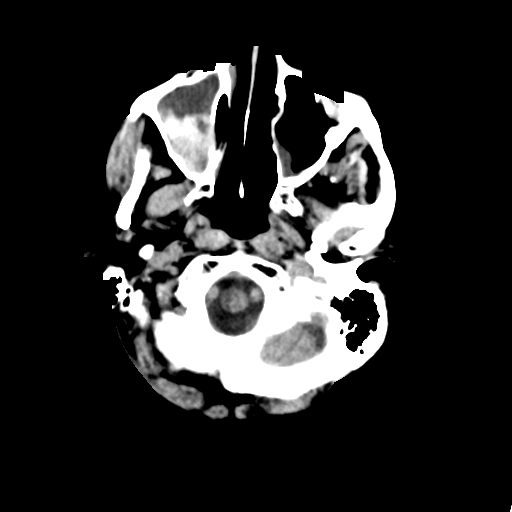
[im 9/33  brain]
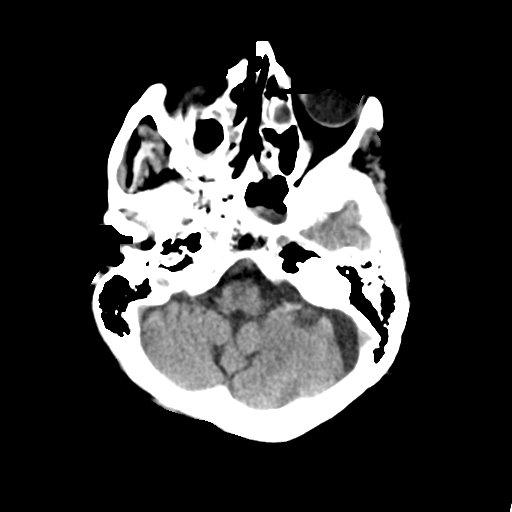
[im 13/33  brain]
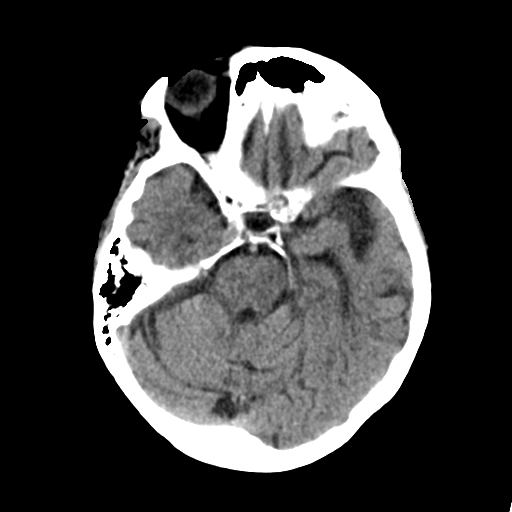
[im 17/33  brain]
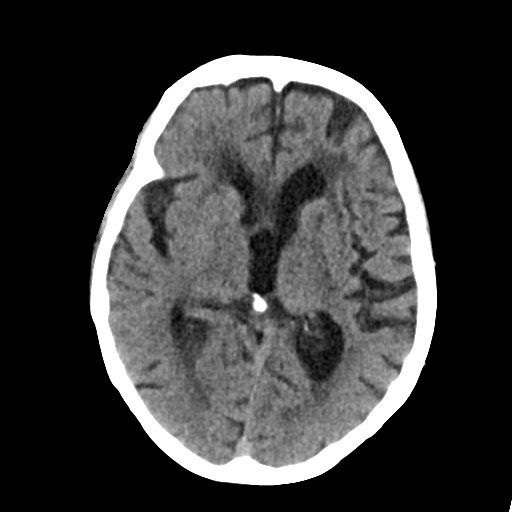
[im 17/33  bone]
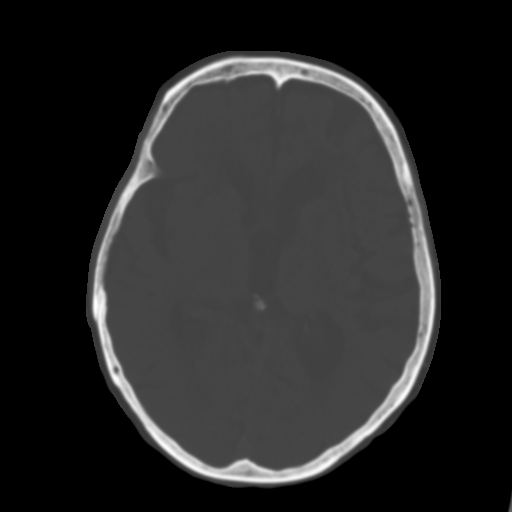
[im 20/33  brain]
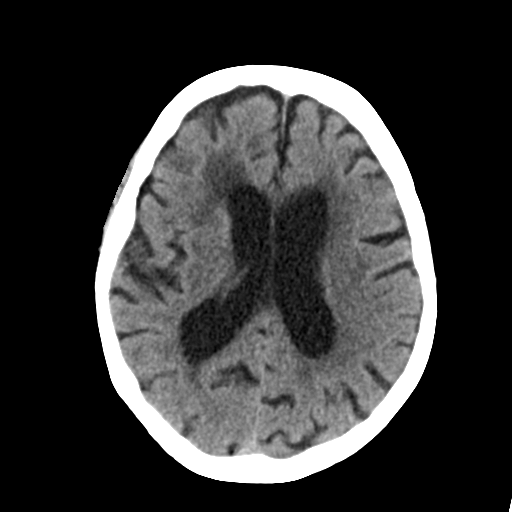
[im 24/33  brain]
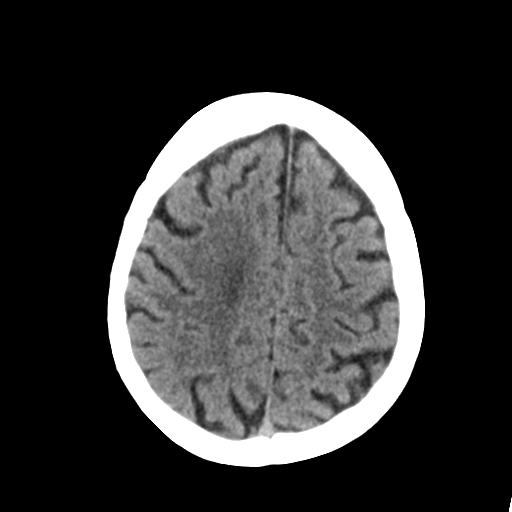
[im 27/33  brain]
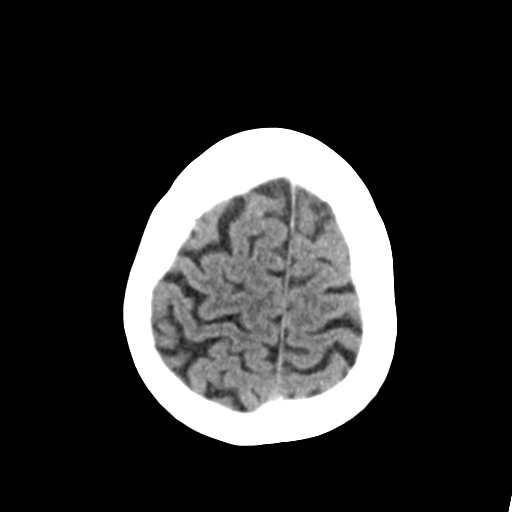
[im 30/33  brain]
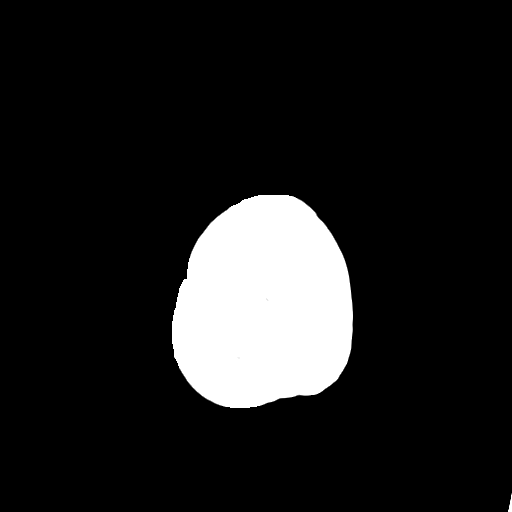
[im 30/33  bone]
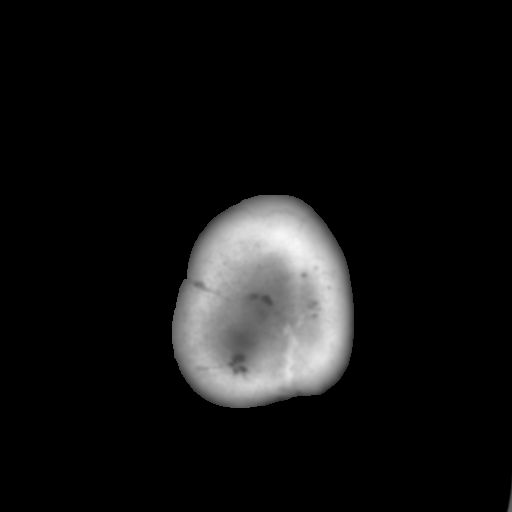

[Series 5: head 3.0 mpr cor · coronal · 0.32mm/px · 3 of 67 slices shown]
[im 23/67  brain]
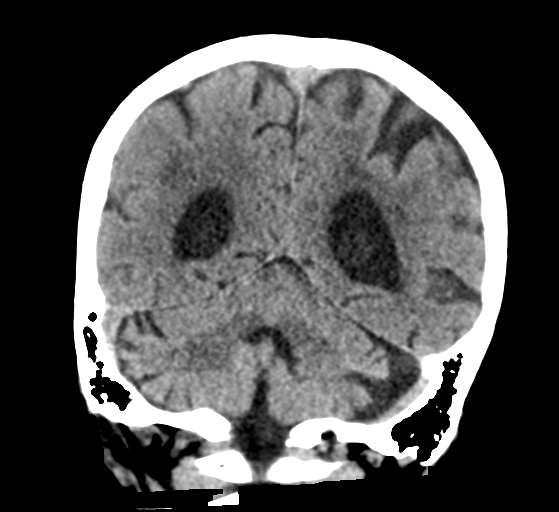
[im 30/67  brain]
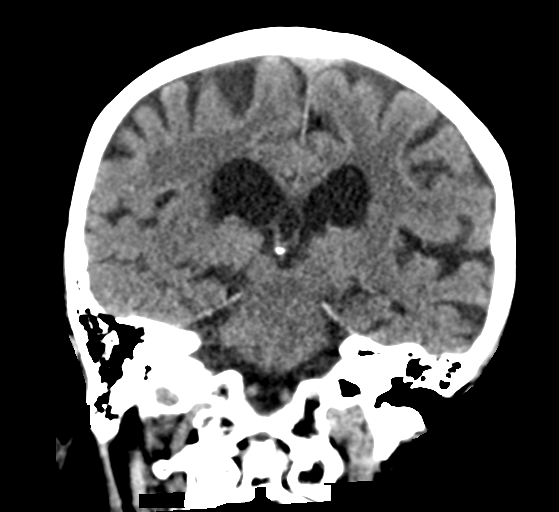
[im 37/67  brain]
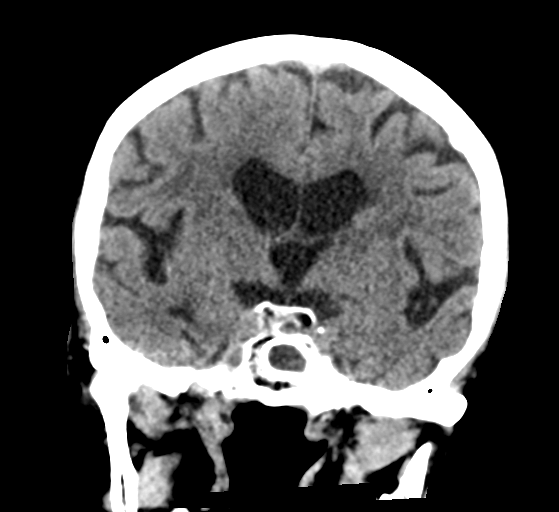

[Series 6: head 3.0 mpr sag · sagittal · 0.32mm/px · 3 of 64 slices shown]
[im 22/64  brain]
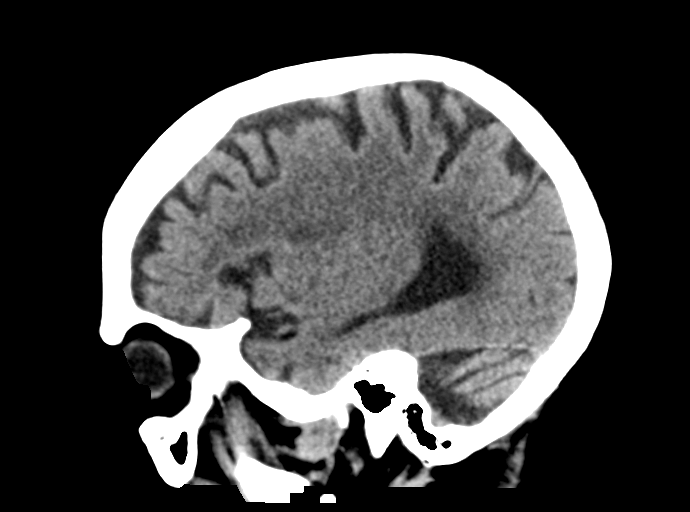
[im 32/64  brain]
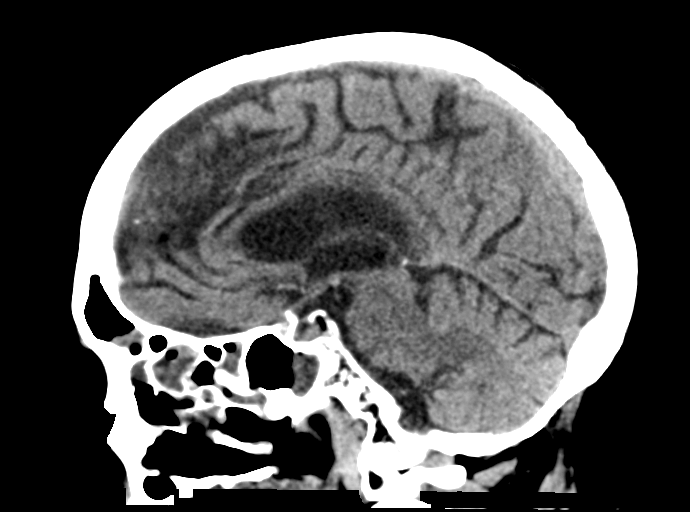
[im 43/64  brain]
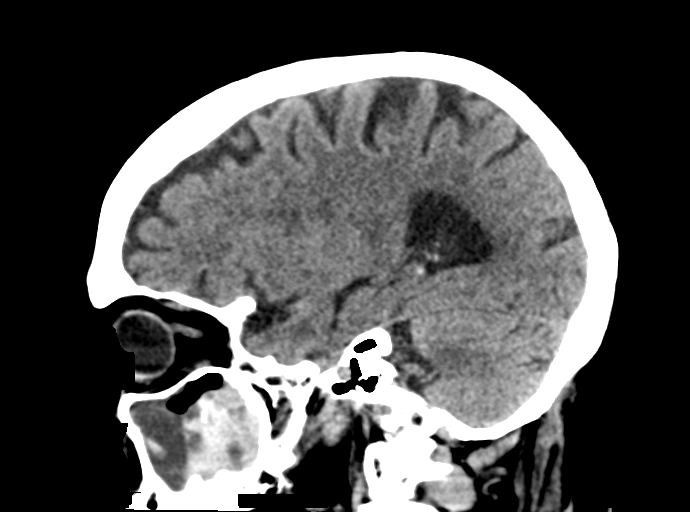

[15 of 47 positions shown; findings below may reference images not displayed]

FINDINGS: Brain: Significant head tilt in the gantry accounts for apparent
asymmetry in the cerebral hemispheres. Moderate to severe age
appropriate cortical, deep and cerebellar atrophy, unchanged. Severe
changes of small vessel disease of the white matter diffusely,
unchanged. No mass lesion. No midline shift. No acute hemorrhage or
hematoma. No extra-axial fluid collections. No evidence of acute
infarction.

Vascular: Moderate BILATERAL carotid siphon atherosclerosis. No
hyperdense vessel.

Skull: No skull fracture or other focal osseous abnormality
involving the skull.

Sinuses/Orbits: Near complete opacification of the RIGHT maxillary
sinus with hyperdense material in the sinus. Mucosal thickening
involving the LEFT maxillary sinus and the LEFT sphenoid sinus.
Opacification of multiple BILATERAL ethmoid air cells. RIGHT
sphenoid sinus in both frontal sinuses well aerated. BILATERAL
mastoid air cells and BILATERAL middle ear cavities well-aerated.

Visualized orbits and globes normal in appearance.

Other: None.
IMPRESSION: 1. No acute intracranial abnormality.
2. Moderate to severe age-appropriate generalized atrophy and severe
chronic microvascular ischemic changes of the white matter.
3. Chronic pansinusitis. Query superimposed chronic fungal infection
in the RIGHT maxillary sinus.

## 2018-12-22 ENCOUNTER — Encounter: Payer: Self-pay | Admitting: Nurse Practitioner

## 2018-12-22 ENCOUNTER — Non-Acute Institutional Stay (SKILLED_NURSING_FACILITY): Payer: Medicare Other | Admitting: Nurse Practitioner

## 2018-12-22 DIAGNOSIS — K5900 Constipation, unspecified: Secondary | ICD-10-CM | POA: Diagnosis not present

## 2018-12-22 DIAGNOSIS — R4189 Other symptoms and signs involving cognitive functions and awareness: Secondary | ICD-10-CM | POA: Diagnosis not present

## 2018-12-22 DIAGNOSIS — I739 Peripheral vascular disease, unspecified: Secondary | ICD-10-CM | POA: Diagnosis not present

## 2018-12-22 DIAGNOSIS — K219 Gastro-esophageal reflux disease without esophagitis: Secondary | ICD-10-CM | POA: Diagnosis not present

## 2018-12-22 DIAGNOSIS — E039 Hypothyroidism, unspecified: Secondary | ICD-10-CM | POA: Diagnosis not present

## 2018-12-22 DIAGNOSIS — F329 Major depressive disorder, single episode, unspecified: Secondary | ICD-10-CM

## 2018-12-22 NOTE — Assessment & Plan Note (Signed)
Stable, continue MiraLax qd.  

## 2018-12-22 NOTE — Assessment & Plan Note (Signed)
Stable, continue Famotidine 20mg qd.  

## 2018-12-22 NOTE — Progress Notes (Signed)
Location:   [facility] [place] Room Number: 25 Place of Service:  SNF (31) Provider: Mast, Man X, NP  Mast, Man X, NP  Patient Care Team: Mast, Man X, NP as PCP - General (Internal Medicine) Newton Pigg, MD as Consulting Physician (Obstetrics and Gynecology) Monna Fam, MD as Consulting Physician (Ophthalmology) Melida Quitter, MD as Consulting Physician (Otolaryngology) Myrlene Broker, MD as Consulting Physician (Urology) [place], [facility] Virgie Dad, MD as Consulting Physician (Internal Medicine)  Extended Emergency Contact Information Primary Emergency Contact: Kropp,[name] Address: [place], [place] Phone: [phone] Work Phone: [phone] Mobile Phone: [phone] Relation: Son Secondary Emergency Contact: Teofilo [place] Phone: [phone] Work Phone: [phone] Relation: Friend  Code Status:  DNR Goals of care: Advanced Directive information Advanced Directives 12/22/2018  Does Patient Have a Medical Advance Directive? Yes  Type of Paramedic of [place];Living will;Out of facility DNR (pink MOST or yellow form)  Does patient want to make changes to medical advance directive? No - Patient declined  Copy of [place] in Chart? Yes - validated most recent copy scanned in chart (See row information)  Pre-existing out of facility DNR order (yellow form or pink MOST form) -     Chief Complaint  Patient presents with  . Medical Management of Chronic Issues  . Health Maintenance    TDAP, influenza vaccine    HPI:  Pt is a 85 y.o. female seen today for medical management of chronic diseases.     The patient resides in SNF [facility] for safety, care assistance, w/c for mobility, her mood is stable, on Escitalopram 20mg  qd, Mirtazapine 15mg  qd. Hx of constipation, stable, on MiraLax qd. Hypothyroidism, on Levothyroxine  53mcg qd, last TSH wnl. BLE edema, trace, chronic, on Furosemide 20mg  qd. GERD, stable, on Famotidine 20mg  qd.    Past Medical History:  Diagnosis Date  . Asthma   . Carpal tunnel syndrome 03/07/2009  . Disturbance of skin sensation 02/21/2009  . Diverticulosis of colon (without mention of hemorrhage) 02/16/2009  . Fracture of upper end of left humerus 11/18/14  . 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 Hypothyroid   . Osteoarthrosis, unspecified whether generalized or localized, unspecified site 02/16/2009  . Other abnormal blood chemistry 06/19/2010   hyperglycemia  . Other and unspecified hyperlipidemia 10/24/2009  . Other atopic dermatitis and related conditions 02/16/2009  . Otosclerosis, unspecified 02/22/2003  . Pain in limb 12/05/2009  . Senile osteoporosis 06/16/2010  . Sensorineural hearing loss, unspecified 02/21/2009  . Syncope and collapse 05/30/2009  . Unspecified nasal polyp 02/21/2009  . Unspecified urinary incontinence 02/21/2009   Past Surgical History:  Procedure Laterality Date  . ABDOMINAL HYSTERECTOMY  1999   Dr. Collier Bullock  . BREAST BIOPSY  07/16/1990   benign left breast needle biopsy  Dr. Margot Chimes  . CARPAL TUNNEL RELEASE  04/07/2009   right Dr. Daylene Katayama  . CATARACT EXTRACTION W/ INTRAOCULAR LENS  IMPLANT, BILATERAL Bilateral   . INTRAMEDULLARY (IM) NAIL INTERTROCHANTERIC Right 09/17/2017   Procedure: INTRAMEDULLARY (IM) NAIL INTERTROCHANTRIC HIP;  Surgeon: Nicholes Stairs, MD;  Location: [place];  Service: Orthopedics;  Laterality: Right;  . OPEN REDUCTION INTERNAL FIXATION (ORIF) DISTAL RADIAL FRACTURE Right 09/17/2017   Procedure: OPEN REDUCTION INTERNAL FIXATION (ORIF) DISTAL RADIAL FRACTURE;  Surgeon: Iran Planas, MD;  Location: [place];  Service: Orthopedics;  Laterality:  Right;  Marland Kitchen TRIGGER FINGER RELEASE Left 08/09/2014   Procedure: RELEASE TRIGGER FINGER/A-1 PULLEY LEFT MIDDLE FINGER;  Surgeon: Daryll Brod, MD;  Location: [place];  Service: Orthopedics;   Laterality: Left;  REGIONAL/FAB    Allergies  Allergen Reactions  . Sulfa Antibiotics Itching and Rash  . Amoxicillin Other (See Comments)    Per [facility] 09/15/17 Has patient had a PCN reaction causing immediate rash, facial/tongue/throat swelling, SOB or lightheadedness with hypotension: Unknown Has patient had a PCN reaction causing severe rash involving mucus membranes or skin necrosis: Unknown Has patient had a PCN reaction that required hospitalization: Unknown Has patient had a PCN reaction occurring within the last 10 years: Unknown If all of the above answers are "NO", then may proceed with Cephalosporin use.  . Avelox [Moxifloxacin Hcl In Nacl] Itching  . Doxycycline Other (See Comments)    Unknown reaction  . Hista-Tabs [Triprolidine-Pse] Other (See Comments)    Passed out  . Pyrilamine-Phenylephrine Other (See Comments)    Unknown reaction - per [facility] 09/15/17  . Septra [Sulfamethoxazole-Trimethoprim] Other (See Comments)    Unknown reaction    Allergies as of 12/22/2018      Reactions   Sulfa Antibiotics Itching, Rash   Amoxicillin Other (See Comments)   Per [facility] 09/15/17 Has patient had a PCN reaction causing immediate rash, facial/tongue/throat swelling, SOB or lightheadedness with hypotension: Unknown Has patient had a PCN reaction causing severe rash involving mucus membranes or skin necrosis: Unknown Has patient had a PCN reaction that required hospitalization: Unknown Has patient had a PCN reaction occurring within the last 10 years: Unknown If all of the above answers are "NO", then may proceed with Cephalosporin use.   Avelox [moxifloxacin Hcl In Nacl] Itching   Doxycycline Other (See Comments)   Unknown reaction   Hista-tabs [triprolidine-pse] Other (See Comments)   Passed out   Pyrilamine-phenylephrine Other (See Comments)   Unknown reaction - per [facility] 09/15/17   Septra [sulfamethoxazole-trimethoprim] Other (See Comments)   Unknown reaction      Medication List        Accurate as of December 22, 2018  3:51 PM. If you have any questions, ask your nurse or doctor.        STOP taking these medications   HYDROcodone-acetaminophen 5-325 MG tablet Commonly known as: NORCO/VICODIN Stopped by: Man X Mast, NP     TAKE these medications   acetaminophen 325 MG tablet Commonly known as: TYLENOL Take 650 mg by mouth every 6 (six) hours as needed.   aspirin EC 81 MG tablet Take 81 mg by mouth daily. On Tuesday and Saturday   escitalopram 20 MG tablet Commonly known as: LEXAPRO Take 20 mg by mouth daily.   famotidine 20 MG tablet Commonly known as: PEPCID Take 20 mg by mouth daily.   feeding supplement Liqd Take 237 mLs by mouth 2 (two) times daily. Add ice cream to boost Plus twice a day   Fluticasone-Salmeterol 250-50 MCG/DOSE Aepb Commonly known as: ADVAIR Inhale 1 puff 2 (two) times daily into the lungs. Rinse mouth after use   furosemide 20 MG tablet Commonly known as: LASIX Take 20 mg by mouth daily. Hold for SBP < 110   ipratropium 0.02 % nebulizer solution Commonly known as: ATROVENT Take 0.5 mg by nebulization every 6 (six) hours as needed for wheezing or shortness of breath.   levothyroxine 25 MCG tablet Commonly known as: SYNTHROID Take 25 mcg by mouth daily before breakfast.   mirtazapine  15 MG tablet Commonly known as: REMERON Take 15 mg by mouth daily.   OXYGEN Inhale 2 L/min into the lungs as needed (to keep O2 SATS >90%).   polyethylene glycol 17 g packet Commonly known as: MIRALAX / GLYCOLAX Take 17 g by mouth daily.   raloxifene 60 MG tablet Commonly known as: EVISTA Take 1 tablet by mouth  daily to treat osteoporosis   Ventolin HFA 108 (90 Base) MCG/ACT inhaler Generic drug: albuterol Inhale 2 puffs into the lungs every 6 (six) hours as needed for shortness of breath (dyspnea).   zinc oxide 20 % ointment Apply to buttocks as needed every shift after incontinent episodes      ROS was provided with  assistance of staff.  Review of Systems  Constitutional: Negative for activity change, appetite change, chills, diaphoresis, fatigue, fever and unexpected weight change.  HENT: Positive for hearing loss. Negative for voice change.   Respiratory: Positive for cough. Negative for shortness of breath and wheezing.   Cardiovascular: Positive for leg swelling. Negative for chest pain and palpitations.  Gastrointestinal: Negative for abdominal distention, abdominal pain, constipation, diarrhea, nausea and vomiting.  Genitourinary: Negative for difficulty urinating, dysuria and urgency.  Musculoskeletal: Positive for arthralgias and gait problem.  Skin: Negative for color change and pallor.  Neurological: Negative for dizziness, speech difficulty, weakness and headaches.       Memory lapses.   Psychiatric/Behavioral: Negative for agitation, behavioral problems, hallucinations and sleep disturbance. The patient is not nervous/anxious.     Immunization History  Administered Date(s) Administered  . Influenza Whole 12/27/2011, 11/05/2012  . Influenza-Unspecified 11/18/2013, 11/03/2014, 11/16/2015, 11/13/2016, 11/17/2017  . PPD Test 04/04/2008, 11/23/2014  . Pneumococcal Conjugate-13 09/30/2016  . Pneumococcal Polysaccharide-23 02/05/2008, 06/03/2008  . Td 02/05/2008, 06/03/2008   Pertinent  Health Maintenance Due  Topic Date Due  . INFLUENZA VACCINE  09/05/2018  . DEXA SCAN  Completed  . PNA vac Low Risk Adult  Completed   Fall Risk  10/09/2017 09/23/2016 08/27/2016 05/30/2015 05/16/2015  Falls in the past year? Yes Yes Yes Yes No  Comment - - Emmi Telephone Survey: data to providers prior to load - -  Number falls in past yr: 2 or more 1 1 1  -  Comment - - Emmi Telephone Survey Actual Response = 1 - -  Injury with Fall? Yes No No Yes -  Comment broke R wrist - - - -  Risk Factor Category  - - - - -  Risk for fall due to : - - - History of fall(s);Impaired balance/gait;Impaired mobility -   Follow up - - - Falls evaluation completed;Education provided -   Functional Status Survey:    Vitals:   12/22/18 1428  BP: (!) 142/67  Pulse: 64  Resp: 20  Temp: (!) 97.3 F (36.3 C)  SpO2: 95%  Weight: 136 lb 1.6 oz (61.7 kg)  Height: 4\' 8"  (1.422 m)   Body mass index is 30.51 kg/m. Physical Exam Vitals signs and nursing note reviewed.  Constitutional:      General: She is not in acute distress.    Appearance: Normal appearance. She is obese. She is not ill-appearing, toxic-appearing or diaphoretic.  HENT:     Head: Normocephalic and atraumatic.     Nose: Nose normal.     Mouth/Throat:     Mouth: Mucous membranes are moist.  Eyes:     Extraocular Movements: Extraocular movements intact.     Conjunctiva/sclera: Conjunctivae normal.     Pupils: Pupils  are equal, round, and reactive to light.  Neck:     Musculoskeletal: Normal range of motion and neck supple.  Cardiovascular:     Rate and Rhythm: Normal rate and regular rhythm.     Heart sounds: No murmur. No gallop.   Pulmonary:     Breath sounds: No wheezing, rhonchi or rales.     Comments: Decreased air entry R+L lungs.  Abdominal:     Tenderness: There is no abdominal tenderness. There is no right CVA tenderness, left CVA tenderness, guarding or rebound.  Musculoskeletal:     Right lower leg: Edema present.     Left lower leg: Edema present.     Comments: Trace edema BLE. Over head ROM of the R+L shoulder is slightly decreased. No apparent pain in wrists.   Skin:    General: Skin is warm and dry.  Neurological:     General: No focal deficit present.     Mental Status: She is alert. Mental status is at baseline.     Motor: No weakness.     Coordination: Coordination normal.     Gait: Gait abnormal.     Comments: Oriented to person, place.   Psychiatric:        Mood and Affect: Mood normal.        Behavior: Behavior normal.        Thought Content: Thought content normal.        Judgment: Judgment normal.      Labs reviewed: Recent Labs    03/19/18 06/17/18  NA 142 143  K 4.3 4.0  BUN 27* 27*  CREATININE 1.1 1.0   Recent Labs    03/19/18 06/17/18  AST 14 14  ALT 11 11  ALKPHOS  --  45   Recent Labs    03/19/18 06/17/18  WBC 6.0 5.4  HGB 12.3 12.9  HCT 38 39  PLT 245 224   Lab Results  Component Value Date   TSH 1.62 05/11/2018   Lab Results  Component Value Date   HGBA1C 6.0 06/17/2018   Lab Results  Component Value Date   CHOL 211 (A) 02/14/2014   HDL 47 02/14/2014   LDLCALC 133 02/14/2014   TRIG 155 02/14/2014    Significant Diagnostic Results in last 30 days:  No results found.  Assessment/Plan PVD (peripheral vascular disease) (HCC) Stable, trace edema BLE, continue Furosemide 20mg  qd.   GERD (gastroesophageal reflux disease) Stable, continue Famotidine 20mg  qd.   Hypothyroid Stable, continue Levothyroxine 68mcg qd.   Major depression, chronic Her mood is stable, continue Mirtazapine 15mg  qd, Escitalopram 20mg  qd.  Constipation Stable, continue MiraLax qd.   Cognitive impairment Continue SNF [facility] for safety, care assistance, w/c for mobility.  '   Family/ staff Communication: plan of care reviewed with the patient and charge nurse.   Labs/tests ordered:  None  Time spend 25 minutes.

## 2018-12-22 NOTE — Assessment & Plan Note (Signed)
Stable, trace edema BLE, continue Furosemide 20mg  qd.

## 2018-12-22 NOTE — Assessment & Plan Note (Signed)
Continue SNF [facility] for safety, care assistance, w/c for mobility.

## 2018-12-22 NOTE — Assessment & Plan Note (Signed)
Her mood is stable, continue  Mirtazapine 15mg qd, Escitalopram 20mg qd.  

## 2018-12-22 NOTE — Assessment & Plan Note (Signed)
Stable, continue Levothyroxine 25mcg qd.  

## 2019-01-07 ENCOUNTER — Encounter (HOSPITAL_COMMUNITY): Payer: Self-pay

## 2019-01-07 ENCOUNTER — Emergency Department (HOSPITAL_COMMUNITY): Payer: Medicare Other

## 2019-01-07 ENCOUNTER — Inpatient Hospital Stay (HOSPITAL_COMMUNITY)
Admission: EM | Admit: 2019-01-07 | Discharge: 2019-01-09 | DRG: 177 | Disposition: A | Discharge status: Skilled Nursing Facility | Payer: Medicare Other | Source: Skilled Nursing Facility | Attending: Internal Medicine | Admitting: Internal Medicine

## 2019-01-07 ENCOUNTER — Other Ambulatory Visit: Payer: Self-pay

## 2019-01-07 ENCOUNTER — Observation Stay (HOSPITAL_COMMUNITY): Payer: Medicare Other

## 2019-01-07 DIAGNOSIS — K219 Gastro-esophageal reflux disease without esophagitis: Secondary | ICD-10-CM | POA: Diagnosis present

## 2019-01-07 DIAGNOSIS — Z961 Presence of intraocular lens: Secondary | ICD-10-CM | POA: Diagnosis present

## 2019-01-07 DIAGNOSIS — Z79890 Hormone replacement therapy (postmenopausal): Secondary | ICD-10-CM

## 2019-01-07 DIAGNOSIS — Z9841 Cataract extraction status, right eye: Secondary | ICD-10-CM

## 2019-01-07 DIAGNOSIS — I959 Hypotension, unspecified: Secondary | ICD-10-CM | POA: Diagnosis not present

## 2019-01-07 DIAGNOSIS — Z66 Do not resuscitate: Secondary | ICD-10-CM | POA: Diagnosis present

## 2019-01-07 DIAGNOSIS — Z800 Family history of malignant neoplasm of digestive organs: Secondary | ICD-10-CM

## 2019-01-07 DIAGNOSIS — I454 Nonspecific intraventricular block: Secondary | ICD-10-CM | POA: Diagnosis present

## 2019-01-07 DIAGNOSIS — Z818 Family history of other mental and behavioral disorders: Secondary | ICD-10-CM

## 2019-01-07 DIAGNOSIS — J690 Pneumonitis due to inhalation of food and vomit: Secondary | ICD-10-CM | POA: Diagnosis not present

## 2019-01-07 DIAGNOSIS — K449 Diaphragmatic hernia without obstruction or gangrene: Secondary | ICD-10-CM | POA: Diagnosis not present

## 2019-01-07 DIAGNOSIS — Z20828 Contact with and (suspected) exposure to other viral communicable diseases: Secondary | ICD-10-CM | POA: Diagnosis present

## 2019-01-07 DIAGNOSIS — Z79810 Long term (current) use of selective estrogen receptor modulators (SERMs): Secondary | ICD-10-CM

## 2019-01-07 DIAGNOSIS — R410 Disorientation, unspecified: Secondary | ICD-10-CM | POA: Diagnosis not present

## 2019-01-07 DIAGNOSIS — Z881 Allergy status to other antibiotic agents status: Secondary | ICD-10-CM

## 2019-01-07 DIAGNOSIS — R0602 Shortness of breath: Secondary | ICD-10-CM | POA: Diagnosis not present

## 2019-01-07 DIAGNOSIS — Z90710 Acquired absence of both cervix and uterus: Secondary | ICD-10-CM

## 2019-01-07 DIAGNOSIS — Z882 Allergy status to sulfonamides status: Secondary | ICD-10-CM

## 2019-01-07 DIAGNOSIS — R000 Tachycardia, unspecified: Secondary | ICD-10-CM | POA: Diagnosis not present

## 2019-01-07 DIAGNOSIS — E039 Hypothyroidism, unspecified: Secondary | ICD-10-CM | POA: Diagnosis present

## 2019-01-07 DIAGNOSIS — R0902 Hypoxemia: Secondary | ICD-10-CM

## 2019-01-07 DIAGNOSIS — E669 Obesity, unspecified: Secondary | ICD-10-CM | POA: Diagnosis present

## 2019-01-07 DIAGNOSIS — Z888 Allergy status to other drugs, medicaments and biological substances status: Secondary | ICD-10-CM

## 2019-01-07 DIAGNOSIS — Z6831 Body mass index (BMI) 31.0-31.9, adult: Secondary | ICD-10-CM

## 2019-01-07 DIAGNOSIS — M810 Age-related osteoporosis without current pathological fracture: Secondary | ICD-10-CM | POA: Diagnosis present

## 2019-01-07 DIAGNOSIS — F339 Major depressive disorder, recurrent, unspecified: Secondary | ICD-10-CM | POA: Diagnosis not present

## 2019-01-07 DIAGNOSIS — J80 Acute respiratory distress syndrome: Secondary | ICD-10-CM | POA: Diagnosis not present

## 2019-01-07 DIAGNOSIS — J9601 Acute respiratory failure with hypoxia: Secondary | ICD-10-CM | POA: Diagnosis present

## 2019-01-07 DIAGNOSIS — Z79899 Other long term (current) drug therapy: Secondary | ICD-10-CM

## 2019-01-07 DIAGNOSIS — N179 Acute kidney failure, unspecified: Secondary | ICD-10-CM | POA: Diagnosis not present

## 2019-01-07 DIAGNOSIS — Z880 Allergy status to penicillin: Secondary | ICD-10-CM

## 2019-01-07 DIAGNOSIS — Z7982 Long term (current) use of aspirin: Secondary | ICD-10-CM

## 2019-01-07 DIAGNOSIS — Z993 Dependence on wheelchair: Secondary | ICD-10-CM

## 2019-01-07 DIAGNOSIS — J449 Chronic obstructive pulmonary disease, unspecified: Secondary | ICD-10-CM | POA: Diagnosis present

## 2019-01-07 DIAGNOSIS — Z9842 Cataract extraction status, left eye: Secondary | ICD-10-CM

## 2019-01-07 DIAGNOSIS — I4510 Unspecified right bundle-branch block: Secondary | ICD-10-CM | POA: Diagnosis not present

## 2019-01-07 DIAGNOSIS — Z808 Family history of malignant neoplasm of other organs or systems: Secondary | ICD-10-CM

## 2019-01-07 DIAGNOSIS — Z7951 Long term (current) use of inhaled steroids: Secondary | ICD-10-CM

## 2019-01-07 DIAGNOSIS — J9600 Acute respiratory failure, unspecified whether with hypoxia or hypercapnia: Secondary | ICD-10-CM | POA: Diagnosis present

## 2019-01-07 DIAGNOSIS — T17908A Unspecified foreign body in respiratory tract, part unspecified causing other injury, initial encounter: Secondary | ICD-10-CM

## 2019-01-07 DIAGNOSIS — Z7401 Bed confinement status: Secondary | ICD-10-CM

## 2019-01-07 DIAGNOSIS — F0390 Unspecified dementia without behavioral disturbance: Secondary | ICD-10-CM | POA: Diagnosis present

## 2019-01-07 DIAGNOSIS — I739 Peripheral vascular disease, unspecified: Secondary | ICD-10-CM | POA: Diagnosis present

## 2019-01-07 LAB — CBC
HCT: 42.3 % (ref 36.0–46.0)
HCT: 41.2 % (ref 36.0–46.0)
Hemoglobin: 13.5 g/dL (ref 12.0–15.0)
Hemoglobin: 13.2 g/dL (ref 12.0–15.0)
MCH: 30.1 pg (ref 26.0–34.0)
MCH: 29.9 pg (ref 26.0–34.0)
MCHC: 31.9 g/dL (ref 30.0–36.0)
MCHC: 32 g/dL (ref 30.0–36.0)
MCV: 94.2 fL (ref 80.0–100.0)
MCV: 93.4 fL (ref 80.0–100.0)
Platelets: 259 10*3/uL (ref 150–400)
Platelets: 258 10*3/uL (ref 150–400)
RBC: 4.49 MIL/uL (ref 3.87–5.11)
RBC: 4.41 MIL/uL (ref 3.87–5.11)
RDW: 13.7 % (ref 11.5–15.5)
RDW: 13.7 % (ref 11.5–15.5)
WBC: 7.9 10*3/uL (ref 4.0–10.5)
WBC: 6.9 10*3/uL (ref 4.0–10.5)
nRBC: 0 % (ref 0.0–0.2)
nRBC: 0 % (ref 0.0–0.2)

## 2019-01-07 LAB — CBC WITH DIFFERENTIAL/PLATELET
Abs Immature Granulocytes: 0.05 10*3/uL (ref 0.00–0.07)
Basophils Absolute: 0.1 10*3/uL (ref 0.0–0.1)
Basophils Relative: 1 %
Eosinophils Absolute: 0.7 10*3/uL — ABNORMAL HIGH (ref 0.0–0.5)
Eosinophils Relative: 6 %
HCT: 45.7 % (ref 36.0–46.0)
Hemoglobin: 14 g/dL (ref 12.0–15.0)
Immature Granulocytes: 0 %
Lymphocytes Relative: 26 %
Lymphs Abs: 3.4 10*3/uL (ref 0.7–4.0)
MCH: 31.6 pg (ref 26.0–34.0)
MCHC: 30.6 g/dL (ref 30.0–36.0)
MCV: 103.2 fL — ABNORMAL HIGH (ref 80.0–100.0)
Monocytes Absolute: 1.1 10*3/uL — ABNORMAL HIGH (ref 0.1–1.0)
Monocytes Relative: 9 %
Neutro Abs: 7.5 10*3/uL (ref 1.7–7.7)
Neutrophils Relative %: 58 %
Platelets: 213 10*3/uL (ref 150–400)
RBC: 4.43 MIL/uL (ref 3.87–5.11)
RDW: 17.2 % — ABNORMAL HIGH (ref 11.5–15.5)
WBC: 12.9 10*3/uL — ABNORMAL HIGH (ref 4.0–10.5)
nRBC: 0 % (ref 0.0–0.2)

## 2019-01-07 LAB — BASIC METABOLIC PANEL
Anion gap: 13 (ref 5–15)
BUN: 13 mg/dL (ref 8–23)
CO2: 23 mmol/L (ref 22–32)
Calcium: 9.5 mg/dL (ref 8.9–10.3)
Chloride: 105 mmol/L (ref 98–111)
Creatinine, Ser: 0.99 mg/dL (ref 0.44–1.00)
GFR calc Af Amer: 56 mL/min — ABNORMAL LOW (ref >60)
GFR calc non Af Amer: 48 mL/min — ABNORMAL LOW (ref >60)
Glucose, Bld: 180 mg/dL — ABNORMAL HIGH (ref 70–99)
Potassium: 4 mmol/L (ref 3.5–5.1)
Sodium: 141 mmol/L (ref 135–145)

## 2019-01-07 LAB — CREATININE, SERUM
Creatinine, Ser: 1.17 mg/dL — ABNORMAL HIGH (ref 0.44–1.00)
GFR calc Af Amer: 46 mL/min — ABNORMAL LOW (ref >60)
GFR calc non Af Amer: 40 mL/min — ABNORMAL LOW (ref >60)

## 2019-01-07 LAB — TYPE AND SCREEN
ABO/RH(D): A POS
Antibody Screen: NEGATIVE

## 2019-01-07 LAB — SARS CORONAVIRUS 2 (TAT 6-24 HRS): SARS Coronavirus 2: NEGATIVE

## 2019-01-07 MED ORDER — PANTOPRAZOLE SODIUM 40 MG IV SOLR
40.0000 mg | Freq: Two times a day (BID) | INTRAVENOUS | Status: DC
  Administered 2019-01-07 (×2): 40 mg via INTRAVENOUS
  Filled 2019-01-07 (×2): qty 40

## 2019-01-07 MED ORDER — MOMETASONE FURO-FORMOTEROL FUM 200-5 MCG/ACT IN AERO
2.0000 | INHALATION_SPRAY | Freq: Two times a day (BID) | RESPIRATORY_TRACT | Status: DC
  Administered 2019-01-08 – 2019-01-09 (×3): 2 via RESPIRATORY_TRACT
  Filled 2019-01-07 (×2): qty 8.8

## 2019-01-07 MED ORDER — ACETAMINOPHEN 325 MG PO TABS: 650.0000 mg | ORAL_TABLET | Freq: Four times a day (QID) | ORAL | Status: DC | PRN

## 2019-01-07 MED ORDER — LACTATED RINGERS IV BOLUS
500.0000 mL | Freq: Once | INTRAVENOUS | Status: AC
  Administered 2019-01-07: 500 mL via INTRAVENOUS

## 2019-01-07 MED ORDER — ASPIRIN EC 81 MG PO TBEC
81.0000 mg | DELAYED_RELEASE_TABLET | ORAL | Status: DC
  Administered 2019-01-09: 81 mg via ORAL
  Filled 2019-01-07: qty 1

## 2019-01-07 MED ORDER — ONDANSETRON HCL 4 MG/2ML IJ SOLN: 4.0000 mg | Freq: Four times a day (QID) | INTRAMUSCULAR | Status: DC | PRN

## 2019-01-07 MED ORDER — MIRTAZAPINE 7.5 MG PO TABS
15.0000 mg | ORAL_TABLET | Freq: Every day | ORAL | Status: DC
  Administered 2019-01-07 – 2019-01-08 (×2): 15 mg via ORAL
  Filled 2019-01-07 (×2): qty 2

## 2019-01-07 MED ORDER — LEVOTHYROXINE SODIUM 25 MCG PO TABS
25.0000 ug | ORAL_TABLET | Freq: Every day | ORAL | Status: DC
  Administered 2019-01-08 – 2019-01-09 (×2): 25 ug via ORAL
  Filled 2019-01-07 (×2): qty 1

## 2019-01-07 MED ORDER — ONDANSETRON HCL 4 MG PO TABS: 4.0000 mg | ORAL_TABLET | Freq: Four times a day (QID) | ORAL | Status: DC | PRN

## 2019-01-07 MED ORDER — METHYLPREDNISOLONE SODIUM SUCC 40 MG IJ SOLR
40.0000 mg | Freq: Two times a day (BID) | INTRAMUSCULAR | Status: DC
  Administered 2019-01-07 – 2019-01-08 (×3): 40 mg via INTRAVENOUS
  Filled 2019-01-07 (×3): qty 1

## 2019-01-07 MED ORDER — ALBUTEROL SULFATE HFA 108 (90 BASE) MCG/ACT IN AERS
8.0000 | INHALATION_SPRAY | Freq: Once | RESPIRATORY_TRACT | Status: AC
  Administered 2019-01-07: 02:00:00 8 via RESPIRATORY_TRACT
  Filled 2019-01-07: qty 6.7

## 2019-01-07 MED ORDER — ALBUTEROL SULFATE (2.5 MG/3ML) 0.083% IN NEBU
2.5000 mg | INHALATION_SOLUTION | Freq: Two times a day (BID) | RESPIRATORY_TRACT | Status: DC
  Administered 2019-01-08 – 2019-01-09 (×3): 2.5 mg via RESPIRATORY_TRACT
  Filled 2019-01-07 (×4): qty 3

## 2019-01-07 MED ORDER — HEPARIN SODIUM (PORCINE) 5000 UNIT/ML IJ SOLN
5000.0000 [IU] | Freq: Three times a day (TID) | INTRAMUSCULAR | Status: DC
  Administered 2019-01-07 – 2019-01-09 (×6): 5000 [IU] via SUBCUTANEOUS
  Filled 2019-01-07 (×6): qty 1

## 2019-01-07 MED ORDER — ESCITALOPRAM OXALATE 10 MG PO TABS
20.0000 mg | ORAL_TABLET | Freq: Every day | ORAL | Status: DC
  Administered 2019-01-07 – 2019-01-09 (×3): 20 mg via ORAL
  Filled 2019-01-07 (×3): qty 2

## 2019-01-07 MED ORDER — ALBUTEROL SULFATE (2.5 MG/3ML) 0.083% IN NEBU
2.5000 mg | INHALATION_SOLUTION | RESPIRATORY_TRACT | Status: DC
  Administered 2019-01-07 (×3): 2.5 mg via RESPIRATORY_TRACT
  Filled 2019-01-07 (×3): qty 3

## 2019-01-07 MED ORDER — ALBUTEROL SULFATE HFA 108 (90 BASE) MCG/ACT IN AERS
2.0000 | INHALATION_SPRAY | RESPIRATORY_TRACT | Status: DC
  Administered 2019-01-07: 2 via RESPIRATORY_TRACT

## 2019-01-07 MED ORDER — ONDANSETRON HCL 4 MG/2ML IJ SOLN
4.0000 mg | Freq: Once | INTRAMUSCULAR | Status: AC
  Administered 2019-01-07: 4 mg via INTRAVENOUS
  Filled 2019-01-07: qty 2

## 2019-01-07 MED ORDER — ACETAMINOPHEN 650 MG RE SUPP: 650.0000 mg | Freq: Four times a day (QID) | RECTAL | Status: DC | PRN

## 2019-01-07 MED ORDER — FAMOTIDINE 20 MG PO TABS
20.0000 mg | ORAL_TABLET | Freq: Every day | ORAL | Status: DC
  Administered 2019-01-07 – 2019-01-09 (×3): 20 mg via ORAL
  Filled 2019-01-07 (×3): qty 1

## 2019-01-07 MED ORDER — RESOURCE THICKENUP CLEAR PO POWD
Freq: Three times a day (TID) | ORAL | Status: DC
  Administered 2019-01-07 – 2019-01-08 (×3): via ORAL
  Filled 2019-01-07: qty 125

## 2019-01-07 NOTE — ED Notes (Signed)
Pt SPO2 87-90% on room air at this time, pt placed on 2 lpm O2 via [place]

## 2019-01-07 NOTE — ED Triage Notes (Addendum)
[facility]. Per EMS pt had 3 emesis episodes at nursing facility and then pt become SOB afterward with labored breathing and wheezing. Pt has hx of asthma and COPD. Pt received duo neb x 2 with EMS and 125mg  solumedrol. Hx of dementia, however, is neurologically at baseline per EMS. HR 70 with R BBB BP 150/90 SPO2 100% RA CBG 147 RR 20 TEMP 97.14F oral

## 2019-01-07 NOTE — Plan of Care (Signed)
  Problem: Clinical Measurements: Goal: Will remain free from infection Outcome: Progressing Goal: Respiratory complications will improve Outcome: Progressing Goal: Cardiovascular complication will be avoided Outcome: Progressing   Problem: Nutrition: Goal: Adequate nutrition will be maintained Outcome: Progressing   Problem: Coping: Goal: Level of anxiety will decrease Outcome: Progressing   Problem: Pain Managment: Goal: General experience of comfort will improve Outcome: Progressing   Problem: Safety: Goal: Ability to remain free from injury will improve Outcome: Progressing   Problem: Education: Goal: Knowledge of General Education information will improve Description: Including pain rating scale, medication(s)/side effects and non-pharmacologic comfort measures Outcome: Not Progressing  Pt remains oriented to self and place only.

## 2019-01-07 NOTE — H&P (Signed)
History and Physical    Kimberly Greer [id number] DOB: Apr 14, 1921 DOA: 01/07/2019  PCP: Mast, Man X, NP   Patient coming from: [facility].  Chief Complaint: Shortness of breath.  Nausea vomiting.  History obtained from ER physician as patient has dementia.  HPI: Kimberly Greer is a 85 y.o. female with advanced dementia, asthma was brought to the ER after patient was found to be short of breath.  Per the report patient had 3 episodes of nausea vomiting which was dark-colored.  Following which patient became short of breath and had some labored breathing.  EMS on arrival was found to be wheezing.  Was brought to the ER.  ED Course: In the ER patient is hypoxic mildly with wheezing was placed on nebulizer.  Chest x-ray was unremarkable.  EKG showed tachycardia.  Labs showed WBC of 12.9 180 was a blood sugar and CT abdomen pelvis done showed mild prominence of the proximal bowel which could be from possible enteritis.  Patient admitted for further observation.  COVID-19 test is pending.  Blood pressure was mildly low on presentation which improved with fluids.  Review of Systems: As per HPI, rest all negative.   Past Medical History:  Diagnosis Date   Asthma    Carpal tunnel syndrome 03/07/2009   Disturbance of skin sensation 02/21/2009   Diverticulosis of colon (without mention of hemorrhage) 02/16/2009   Fracture of upper end of left humerus 11/18/14   Hypertonicity of bladder 02/16/2009   Hypothyroid    Osteoarthrosis, unspecified whether generalized or localized, unspecified site 02/16/2009   Other abnormal blood chemistry 06/19/2010   hyperglycemia   Other and unspecified hyperlipidemia 10/24/2009   Other atopic dermatitis and related conditions 02/16/2009   Otosclerosis, unspecified 02/22/2003   Pain in limb 12/05/2009   Senile osteoporosis 06/16/2010   Sensorineural hearing loss, unspecified 02/21/2009   Syncope and collapse 05/30/2009    Unspecified nasal polyp 02/21/2009   Unspecified urinary incontinence 02/21/2009    Past Surgical History:  Procedure Laterality Date   ABDOMINAL HYSTERECTOMY  1999   Dr. Collier Bullock   BREAST BIOPSY  07/16/1990   benign left breast needle biopsy  Dr. Margot Chimes   CARPAL TUNNEL RELEASE  04/07/2009   right Dr. Daylene Katayama   CATARACT EXTRACTION W/ INTRAOCULAR LENS  IMPLANT, BILATERAL Bilateral    INTRAMEDULLARY (IM) NAIL INTERTROCHANTERIC Right 09/17/2017   Procedure: INTRAMEDULLARY (IM) NAIL INTERTROCHANTRIC HIP;  Surgeon: Nicholes Stairs, MD;  Location: [facility];  Service: Orthopedics;  Laterality: Right;   OPEN REDUCTION INTERNAL FIXATION (ORIF) DISTAL RADIAL FRACTURE Right 09/17/2017   Procedure: OPEN REDUCTION INTERNAL FIXATION (ORIF) DISTAL RADIAL FRACTURE;  Surgeon: Iran Planas, MD;  Location: [facility];  Service: Orthopedics;  Laterality: Right;   TRIGGER FINGER RELEASE Left 08/09/2014   Procedure: RELEASE TRIGGER FINGER/A-1 PULLEY LEFT MIDDLE FINGER;  Surgeon: Daryll Brod, MD;  Location: [facility];  Service: Orthopedics;  Laterality: Left;  REGIONAL/FAB     reports that she has never smoked. She has never used smokeless tobacco. She reports that she does not drink alcohol or use drugs.  Allergies  Allergen Reactions   Sulfa Antibiotics Itching, Rash and Other (See Comments)    "Allergic," per [facility]   Amoxicillin Other (See Comments)    Per [facility] 09/15/17 Has patient had a PCN reaction causing immediate rash, facial/tongue/throat swelling, SOB or lightheadedness with hypotension: Unknown Has patient had a PCN reaction causing severe rash involving mucus membranes or skin necrosis: Unknown Has  patient had a PCN reaction that required hospitalization: Unknown Has patient had a PCN reaction occurring within the last 10 years: Unknown If all of the above answers are "NO", then may proceed with Cephalosporin use.   Avelox [Moxifloxacin Hcl In Nacl] Itching   Doxycycline Other (See  Comments)    "Allergic," per MAR   Hista-Tabs [Triprolidine-Pse] Other (See Comments)    "Passed out"   Pyrilamine-Phenylephrine Other (See Comments)    "Allergic," per [facility] 09/15/17   Septra [Sulfamethoxazole-Trimethoprim] Itching, Rash and Other (See Comments)    "Allergic," per [facility]    Family History  Problem Relation Age of Onset   Cancer Mother        stomach   Cancer Father        bone   Dementia Sister    Cancer Brother        pancreataic   Cancer Son        pancreatic    Prior to Admission medications   Medication Sig Start Date End Date Taking? Authorizing Provider  acetaminophen (TYLENOL) 325 MG tablet Take 650 mg by mouth every 6 (six) hours as needed (for pain).    Yes [provider]  albuterol (VENTOLIN HFA) 108 (90 Base) MCG/ACT inhaler Inhale 2 puffs into the lungs every 6 (six) hours as needed for shortness of breath (dyspnea).    Yes [provider]  aspirin EC 81 MG tablet Take 81 mg by mouth See admin instructions. Take 81 mg by mouth in the morning on Tuesday and Saturday   Yes [provider]  escitalopram (LEXAPRO) 20 MG tablet Take 20 mg by mouth daily.   Yes [provider]  famotidine (PEPCID) 20 MG tablet Take 20 mg by mouth daily.   Yes [provider]  Fluticasone-Salmeterol (ADVAIR) 250-50 MCG/DOSE AEPB Inhale 1 puff 2 (two) times daily into the lungs. Rinse mouth after use   Yes [provider]  furosemide (LASIX) 20 MG tablet Take 20 mg by mouth See admin instructions. Take 20 mg by mouth in the morning and hold for SBP < 110   Yes [provider]  ipratropium (ATROVENT) 0.02 % nebulizer solution Take 0.5 mg by nebulization every 6 (six) hours as needed for wheezing or shortness of breath.   Yes [provider]  lactose free nutrition (BOOST PLUS) LIQD Take 237 mLs by mouth See admin instructions. Add 237 ml's to ice cream and consume two times a day   Yes [provider]  levothyroxine (SYNTHROID, LEVOTHROID) 25 MCG tablet Take 25 mcg by mouth daily before breakfast.   Yes [provider]  mirtazapine (REMERON) 15 MG tablet Take 15 mg by mouth daily.    Yes [provider]  OXYGEN Inhale 2 L/min into the lungs as needed (to keep O2 SATS >90%).    Yes [provider]  polyethylene glycol (MIRALAX / GLYCOLAX) packet Take 17 g by mouth daily. Mix with 6-8 ounces of fluid of choice   Yes [provider]  raloxifene (EVISTA) 60 MG tablet Take 1 tablet by mouth  daily to treat osteoporosis Patient taking differently: Take 60 mg by mouth daily.  09/23/14  Yes [name], MD  zinc oxide 20 % ointment Apply to buttocks as needed every shift after incontinent episodes   Yes [provider]    Physical Exam: Constitutional: Moderately built and nourished. Vitals:   01/07/19 0445 01/07/19 0506 01/07/19 0530 01/07/19 0545  BP: (!) 113/52 Marland Kitchen)  101/51 (!) 100/45 (!) 100/47  Pulse: 84 86 78 86  Resp: 14 12 15 15   Temp:      TempSrc:      SpO2: 98% 97% 98% 99%   Eyes: Anicteric no pallor. ENMT: No discharge from the ears eyes nose or mouth. Neck: No mass felt.  No neck rigidity. Respiratory: Mild expiratory wheeze and no crepitations. Cardiovascular: S1-S2 heard. Abdomen: Soft nontender bowel sounds present. Musculoskeletal: No edema. Skin: No rash. Neurologic: Alert awake oriented to person.  Moves all extremities. Psychiatric: Confused.   Labs on Admission: I have personally reviewed following labs and imaging studies  CBC: Recent Labs  Lab 01/07/19 0135  WBC 12.9*  NEUTROABS 7.5  HGB 14.0  HCT 45.7  MCV 103.2*  PLT 123456   Basic Metabolic Panel: Recent Labs  Lab 01/07/19 0135  NA 141  K 4.0  CL 105  CO2 23  GLUCOSE 180*  BUN 13  CREATININE 0.99  CALCIUM 9.5   GFR: CrCl cannot be calculated (Unknown ideal weight.). Liver Function Tests: No results for input(s): AST, ALT,  ALKPHOS, BILITOT, PROT, ALBUMIN in the last 168 hours. No results for input(s): LIPASE, AMYLASE in the last 168 hours. No results for input(s): AMMONIA in the last 168 hours. Coagulation Profile: No results for input(s): INR, PROTIME in the last 168 hours. Cardiac Enzymes: No results for input(s): CKTOTAL, CKMB, CKMBINDEX, TROPONINI in the last 168 hours. BNP (last 3 results) No results for input(s): PROBNP in the last 8760 hours. HbA1C: No results for input(s): HGBA1C in the last 72 hours. CBG: No results for input(s): GLUCAP in the last 168 hours. Lipid Profile: No results for input(s): CHOL, HDL, LDLCALC, TRIG, CHOLHDL, LDLDIRECT in the last 72 hours. Thyroid Function Tests: No results for input(s): TSH, T4TOTAL, FREET4, T3FREE, THYROIDAB in the last 72 hours. Anemia Panel: No results for input(s): VITAMINB12, FOLATE, FERRITIN, TIBC, IRON, RETICCTPCT in the last 72 hours. Urine analysis:    Component Value Date/Time   COLORURINE YELLOW 04/25/2011 1503   APPEARANCEUR CLEAR 04/25/2011 1503   LABSPEC 1.012 04/25/2011 1503   PHURINE 7.0 04/25/2011 1503   GLUCOSEU NEGATIVE 04/25/2011 1503   HGBUR NEGATIVE 04/25/2011 1503   BILIRUBINUR NEGATIVE 04/25/2011 1503   KETONESUR NEGATIVE 04/25/2011 1503   PROTEINUR 7.1 06/02/2017   PROTEINUR NEGATIVE 04/25/2011 1503   UROBILINOGEN 0.2 04/25/2011 1503   NITRITE NEGATIVE 04/25/2011 1503   LEUKOCYTESUR TRACE (A) 04/25/2011 1503   Sepsis Labs: @LABRCNTIP (procalcitonin:4,lacticidven:4) )No results found for this or any previous visit (from the past 240 hour(s)).   Radiological Exams on Admission: Dg Chest Port 1 View  Result Date: 01/07/2019 CLINICAL DATA:  Shortness of breath EXAM: PORTABLE CHEST 1 VIEW COMPARISON:  September 15, 2017 FINDINGS: The heart size and mediastinal contours are within normal limits. Aortic knob calcifications. Both lungs are clear. The visualized skeletal structures are unremarkable. IMPRESSION: No active disease.  Electronically Signed   By: Prudencio Pair M.D.   On: 01/07/2019 01:40   Ct Renal Stone Study  Result Date: 01/07/2019 CLINICAL DATA:  Emesis followed by shortness of breath. EXAM: CT ABDOMEN AND PELVIS WITHOUT CONTRAST TECHNIQUE: Multidetector CT imaging of the abdomen and pelvis was performed following the standard protocol without IV contrast. COMPARISON:  None. FINDINGS: Lower chest: The lung bases are clear without focal nodule, mass, or airspace disease. The heart size is normal. No significant pleural or pericardial effusion is present. Hepatobiliary: No focal liver abnormality is seen. No gallstones, gallbladder wall thickening, or  biliary dilatation. Pancreas: Mild pancreatic atrophy is within normal limits for age. No duct dilation is present. No solid or cystic mass lesion is present. No inflammatory changes are present. Spleen: Normal in size without focal abnormality. Adrenals/Urinary Tract: Adrenal glands are normal bilaterally. Kidneys and ureters are within normal limits. No stone or mass lesion is present. There is no obstruction. The urinary bladder is within normal limits. Stomach/Bowel: Small hiatal hernia is present. Stomach and duodenum are otherwise within normal limits. The small bowel is decompressed distally without a focal transition point. There is some stranding in the proximal mesentery. Terminal ileum is within normal limits. The appendix is visualized and normal. The ascending and transverse colon are within normal limits. The descending and sigmoid colon are normal. Vascular/Lymphatic: Atherosclerotic calcifications are present in the aorta and branch vessels without aneurysm. No significant retroperitoneal adenopathy is present. Reproductive: Status post hysterectomy. No adnexal masses. Other: No abdominal wall hernia or abnormality. No abdominopelvic ascites. Musculoskeletal: Mild degenerative changes are present in the lower lumbar spine. No acute fracture is present. No focal  lytic or blastic lesions are present. Right hip ORIF is again noted. IMPRESSION: 1. Mild prominence of the proximal small bowel mesentery is nonspecific. This could represent a proximal enteritis. There is no obstruction. 2. No other acute or focal abnormality to explain the patient's emesis. 3. Small hiatal hernia. 4. Aortic Atherosclerosis (ICD10-I70.0). Electronically Signed   By: San Morelle M.D.   On: 01/07/2019 04:26    EKG: Independently reviewed.  Sinus tachycardia.  Assessment/Plan Active Problems:   Acute respiratory failure with hypoxia (HCC)    1. Acute respiratory failure with hypoxia likely from asthma exacerbation with possible aspiration.  Chest x-ray not showing anything different.  We will keep patient on IV Solu-Medrol inhaler.  Changed to nebulizer once patient is negative for COVID-19. 2. Nausea vomiting with possible enteritis seen in the CAT scan.  Since patient has had dark vomitus we will check serial CBC make sure there is no drop. 3. History of dementia no acute issues at this time.  COVID-19 test is pending.   DVT prophylaxis: SCDs in anticipation of possibility of a GI bleed. Code Status: DNR. Family Communication: No family at the bedside. Disposition Plan: Back to the facility when stable. Consults called: None. Admission status: Observation.   Rise Patience MD [facility] Pager [phone].  If 7PM-7AM, please contact night-coverage [email or web address] Password [email or web address]  01/07/2019, 6:03 AM

## 2019-01-07 NOTE — ED Provider Notes (Signed)
Emergency Department Provider Note   I have reviewed the triage vital signs and the nursing notes.   HISTORY  Chief Complaint Shortness of Breath   HPI Kimberly Greer is a 85 y.o. female with medical problems documented below who presents to the emergency department today secondary to shortness of breath.  History obtained from patient and from EMS.  It seems that the patient had an episode of vomiting after eating and then started having a significant cough and shortness of breath.  The cough improved the shortness of breath did not so they brought her here for further evaluation.  Patient is asymptomatic at this time besides being dyspneic.  No fevers.  No sick contacts.  No history of swallowing difficulties.   No other associated or modifying symptoms.    Past Medical History:  Diagnosis Date  . Asthma   . Carpal tunnel syndrome 03/07/2009  . Disturbance of skin sensation 02/21/2009  . Diverticulosis of colon (without mention of hemorrhage) 02/16/2009  . Fracture of upper end of left humerus 11/18/14  . 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 Hypothyroid   . Osteoarthrosis, unspecified whether generalized or localized, unspecified site 02/16/2009  . Other abnormal blood chemistry 06/19/2010   hyperglycemia  . Other and unspecified hyperlipidemia 10/24/2009  . Other atopic dermatitis and related conditions 02/16/2009  . Otosclerosis, unspecified 02/22/2003  . Pain in limb 12/05/2009  . Senile osteoporosis 06/16/2010  . Sensorineural hearing loss, unspecified 02/21/2009  . Syncope and collapse 05/30/2009  . Unspecified nasal polyp 02/21/2009  . Unspecified urinary incontinence 02/21/2009    Patient Active Problem List   Diagnosis Date Noted  . Acute respiratory failure with hypoxia ([place]) 01/07/2019  . Fracture, humerus closed 07/10/2018  . Chronic rhinitis 04/10/2018  . Iron deficiency anemia 03/17/2018  . Constipation 02/17/2018  . Cognitive impairment 09/22/2017  . Goals  of care, counseling/discussion   . Fall   . Closed fracture of right distal radius   . Hip fracture ([place]) 09/15/2017  . Closed displaced intertrochanteric fracture of right femur ([place]) 09/15/2017  . COPD (chronic obstructive pulmonary disease) ([place]) 09/15/2017  . PVD (peripheral vascular disease) ([place]) 08/25/2017  . Depression, recurrent ([place]) 05/29/2017  . Sinus bradycardia 08/26/2016  . Major depression, chronic 08/26/2016  . Adult failure to thrive 05/10/2016  . Bilateral knee pain 04/25/2015  . Edema 01/03/2015  . GERD (gastroesophageal reflux disease) 11/25/2014  . Osteoporosis 11/25/2014  . Abnormality of gait 03/08/2014  . Trigger finger, acquired 03/08/2014  . Dyspnea 02/09/2013  . Intrinsic asthma 02/09/2013  . Hypothyroid   . Hyperglycemia 06/19/2010  . Hyperlipemia 10/24/2009  . Sensorineural hearing loss 02/21/2009  . OAB (overactive bladder) 02/16/2009  . Osteoarthritis 02/16/2009    Past Surgical History:  Procedure Laterality Date  . ABDOMINAL HYSTERECTOMY  1999   Dr. Collier Bullock  . BREAST BIOPSY  07/16/1990   benign left breast needle biopsy  Dr. Margot Chimes  . CARPAL TUNNEL RELEASE  04/07/2009   right Dr. Daylene Katayama  . CATARACT EXTRACTION W/ INTRAOCULAR LENS  IMPLANT, BILATERAL Bilateral   . INTRAMEDULLARY (IM) NAIL INTERTROCHANTERIC Right 09/17/2017   Procedure: INTRAMEDULLARY (IM) NAIL INTERTROCHANTRIC HIP;  Surgeon: Nicholes Stairs, MD;  Location: [facility];  Service: Orthopedics;  Laterality: Right;  . OPEN REDUCTION INTERNAL FIXATION (ORIF) DISTAL RADIAL FRACTURE Right 09/17/2017   Procedure: OPEN REDUCTION INTERNAL FIXATION (ORIF) DISTAL RADIAL FRACTURE;  Surgeon: Iran Planas, MD;  Location: [facility];  Service: Orthopedics;  Laterality: Right;  . TRIGGER  FINGER RELEASE Left 08/09/2014   Procedure: RELEASE TRIGGER FINGER/A-1 PULLEY LEFT MIDDLE FINGER;  Surgeon: Daryll Brod, MD;  Location: [facility];  Service: Orthopedics;  Laterality: Left;  REGIONAL/FAB       Allergies Sulfa antibiotics, Amoxicillin, Avelox [moxifloxacin hcl in nacl], Doxycycline, Hista-tabs [triprolidine-pse], Pyrilamine-phenylephrine, and Septra [sulfamethoxazole-trimethoprim]  Family History  Problem Relation Age of Onset  . Cancer Mother        stomach  . Cancer Father        bone  . Dementia Sister   . Cancer Brother        pancreataic  . Cancer Son        pancreatic    Social History Social History   Tobacco Use  . Smoking status: Never Smoker  . Smokeless tobacco: Never Used  Substance Use Topics  . Alcohol use: No  . Drug use: No    Review of Systems  All other systems negative except as documented in the HPI. All pertinent positives and negatives as reviewed in the HPI. ____________________________________________   PHYSICAL EXAM:  VITAL SIGNS: ED Triage Vitals  Enc Vitals Group     BP 01/07/19 0100 117/67     Pulse Rate 01/07/19 0049 99     Resp 01/07/19 0049 20     Temp 01/07/19 0049 97.9 F (36.6 C)     Temp Source 01/07/19 0049 Oral     SpO2 01/07/19 0049 95 %    Constitutional: Alert and oriented. Well appearing and in no acute distress. Eyes: Conjunctivae are normal. PERRL. EOMI. Head: Atraumatic. Nose: No congestion/rhinnorhea. Mouth/Throat: Mucous membranes are moist.  Oropharynx non-erythematous. Neck: No stridor.  No meningeal signs.   Cardiovascular: Normal rate, regular rhythm. Good peripheral circulation. Grossly normal heart sounds.   Respiratory: tachypneic respiratory effort.  No retractions. Lungs wheezing LLL. Gastrointestinal: Soft and nontender. No distention.  Musculoskeletal: No lower extremity tenderness nor edema. No gross deformities of extremities. Neurologic:  Normal speech and language. No gross focal neurologic deficits are appreciated.  Skin:  Skin is warm, dry and intact. No rash noted.   ____________________________________________   LABS (all labs ordered are listed, but only abnormal results  are displayed)  Labs Reviewed  BASIC METABOLIC PANEL - Abnormal; Notable for the following components:      Result Value   Glucose, Bld 180 (*)    GFR calc non Af Amer 48 (*)    GFR calc Af Amer 56 (*)    All other components within normal limits  CBC WITH DIFFERENTIAL/PLATELET - Abnormal; Notable for the following components:   WBC 12.9 (*)    MCV 103.2 (*)    RDW 17.2 (*)    Monocytes Absolute 1.1 (*)    Eosinophils Absolute 0.7 (*)    All other components within normal limits  SARS CORONAVIRUS 2 (TAT 6-24 HRS)  CBC  CREATININE, SERUM  CBC  TYPE AND SCREEN   ____________________________________________  EKG   EKG Interpretation  Date/Time:  Thursday January 07 2019 00:46:15 EST Ventricular Rate:  101 PR Interval:    QRS Duration: 123 QT Interval:  364 QTC Calculation: 472 R Axis:   -76 Text Interpretation: Sinus tachycardia IVCD, consider atypical RBBB Inferior infarct, acute Anterior infarct, old Lateral leads are also involved ivcd and repol changes somewhat  similar to previous ecg's Confirmed by Merrily Pew [phone]) on 01/07/2019 1:03:45 AM       ____________________________________________  RADIOLOGY  Dg Chest Port 1 View  Result Date:  01/07/2019 CLINICAL DATA:  Shortness of breath EXAM: PORTABLE CHEST 1 VIEW COMPARISON:  September 15, 2017 FINDINGS: The heart size and mediastinal contours are within normal limits. Aortic knob calcifications. Both lungs are clear. The visualized skeletal structures are unremarkable. IMPRESSION: No active disease. Electronically Signed   By: Prudencio Pair M.D.   On: 01/07/2019 01:40   Ct Renal Stone Study  Result Date: 01/07/2019 CLINICAL DATA:  Emesis followed by shortness of breath. EXAM: CT ABDOMEN AND PELVIS WITHOUT CONTRAST TECHNIQUE: Multidetector CT imaging of the abdomen and pelvis was performed following the standard protocol without IV contrast. COMPARISON:  None. FINDINGS: Lower chest: The lung bases are clear without  focal nodule, mass, or airspace disease. The heart size is normal. No significant pleural or pericardial effusion is present. Hepatobiliary: No focal liver abnormality is seen. No gallstones, gallbladder wall thickening, or biliary dilatation. Pancreas: Mild pancreatic atrophy is within normal limits for age. No duct dilation is present. No solid or cystic mass lesion is present. No inflammatory changes are present. Spleen: Normal in size without focal abnormality. Adrenals/Urinary Tract: Adrenal glands are normal bilaterally. Kidneys and ureters are within normal limits. No stone or mass lesion is present. There is no obstruction. The urinary bladder is within normal limits. Stomach/Bowel: Small hiatal hernia is present. Stomach and duodenum are otherwise within normal limits. The small bowel is decompressed distally without a focal transition point. There is some stranding in the proximal mesentery. Terminal ileum is within normal limits. The appendix is visualized and normal. The ascending and transverse colon are within normal limits. The descending and sigmoid colon are normal. Vascular/Lymphatic: Atherosclerotic calcifications are present in the aorta and branch vessels without aneurysm. No significant retroperitoneal adenopathy is present. Reproductive: Status post hysterectomy. No adnexal masses. Other: No abdominal wall hernia or abnormality. No abdominopelvic ascites. Musculoskeletal: Mild degenerative changes are present in the lower lumbar spine. No acute fracture is present. No focal lytic or blastic lesions are present. Right hip ORIF is again noted. IMPRESSION: 1. Mild prominence of the proximal small bowel mesentery is nonspecific. This could represent a proximal enteritis. There is no obstruction. 2. No other acute or focal abnormality to explain the patient's emesis. 3. Small hiatal hernia. 4. Aortic Atherosclerosis (ICD10-I70.0). Electronically Signed   By: San Morelle M.D.   On:  01/07/2019 04:26    ____________________________________________   PROCEDURES  Procedure(s) performed:   .Critical Care Performed by: Merrily Pew, MD Authorized by: Merrily Pew, MD   Critical care provider statement:    Critical care time (minutes):  45   Critical care was necessary to treat or prevent imminent or life-threatening deterioration of the following conditions:  Respiratory failure   Critical care was time spent personally by me on the following activities:  Discussions with consultants, evaluation of patient's response to treatment, examination of patient, ordering and performing treatments and interventions, ordering and review of laboratory studies, ordering and review of radiographic studies, pulse oximetry, re-evaluation of patient's condition, obtaining history from patient or surrogate and review of old charts  ____________________________________________   INITIAL IMPRESSION / [place] / ED COURSE  Concern for possible aspiration pneumonitis with progressively worsening hypoxia as low as 87%, requiring oxygen. Holding on antibiotics now as this is acute and no infiltrate on xr or fever. Will admit to medicine for same.   Pertinent labs & imaging results that were available during my care of the patient were reviewed by me and considered in my medical decision making (  see chart for details). ____________________________________________  FINAL CLINICAL IMPRESSION(S) / ED DIAGNOSES  Final diagnoses:  Hypoxia  Aspiration pneumonia, unspecified aspiration pneumonia type, unspecified laterality, unspecified part of lung ([place])   MEDICATIONS GIVEN DURING THIS VISIT:  Medications  aspirin EC tablet 81 mg (has no administration in time range)  escitalopram (LEXAPRO) tablet 20 mg (has no administration in time range)  mirtazapine (REMERON) tablet 15 mg (has no administration in time range)  levothyroxine (SYNTHROID) tablet 25 mcg (has no administration  in time range)  famotidine (PEPCID) tablet 20 mg (has no administration in time range)  mometasone-formoterol (DULERA) 200-5 MCG/ACT inhaler 2 puff (has no administration in time range)  acetaminophen (TYLENOL) tablet 650 mg (has no administration in time range)    Or  acetaminophen (TYLENOL) suppository 650 mg (has no administration in time range)  ondansetron (ZOFRAN) tablet 4 mg (has no administration in time range)    Or  ondansetron (ZOFRAN) injection 4 mg (has no administration in time range)  heparin injection 5,000 Units (has no administration in time range)  pantoprazole (PROTONIX) injection 40 mg (has no administration in time range)  methylPREDNISolone sodium succinate (SOLU-MEDROL) 40 mg/mL injection 40 mg (has no administration in time range)  albuterol (PROVENTIL) (2.5 MG/3ML) 0.083% nebulizer solution 2.5 mg (has no administration in time range)  albuterol (VENTOLIN HFA) 108 (90 Base) MCG/ACT inhaler 8 puff (8 puffs Inhalation Given 01/07/19 0138)  ondansetron (ZOFRAN) injection 4 mg (4 mg Intravenous Given 01/07/19 0136)  lactated ringers bolus 500 mL (0 mLs Intravenous Stopped 01/07/19 0248)    NEW OUTPATIENT MEDICATIONS STARTED DURING THIS VISIT:  Current Discharge Medication List      Note:  This note was prepared with assistance of Dragon voice recognition software. Occasional wrong-word or sound-a-like substitutions may have occurred due to the inherent limitations of voice recognition software.   Maitri Schnoebelen, Corene Cornea, MD 01/07/19 [phone]

## 2019-01-07 NOTE — Evaluation (Signed)
Clinical/Bedside Swallow Evaluation Patient Details  Name: Kimberly Greer MRN: [id number] Date of Birth: February 18, 1921  Today's Date: 01/07/2019 Time: SLP Start Time (ACUTE ONLY): 55 SLP Stop Time (ACUTE ONLY): 0955 SLP Time Calculation (min) (ACUTE ONLY): 35 min  Past Medical History:  Past Medical History:  Diagnosis Date  . Asthma   . Carpal tunnel syndrome 03/07/2009  . Disturbance of skin sensation 02/21/2009  . Diverticulosis of colon (without mention of hemorrhage) 02/16/2009  . Fracture of upper end of left humerus 11/18/14  . 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 Hypothyroid   . Osteoarthrosis, unspecified whether generalized or localized, unspecified site 02/16/2009  . Other abnormal blood chemistry 06/19/2010   hyperglycemia  . Other and unspecified hyperlipidemia 10/24/2009  . Other atopic dermatitis and related conditions 02/16/2009  . Otosclerosis, unspecified 02/22/2003  . Pain in limb 12/05/2009  . Senile osteoporosis 06/16/2010  . Sensorineural hearing loss, unspecified 02/21/2009  . Syncope and collapse 05/30/2009  . Unspecified nasal polyp 02/21/2009  . Unspecified urinary incontinence 02/21/2009   Past Surgical History:  Past Surgical History:  Procedure Laterality Date  . ABDOMINAL HYSTERECTOMY  1999   Dr. Collier Bullock  . BREAST BIOPSY  07/16/1990   benign left breast needle biopsy  Dr. Margot Chimes  . CARPAL TUNNEL RELEASE  04/07/2009   right Dr. Daylene Katayama  . CATARACT EXTRACTION W/ INTRAOCULAR LENS  IMPLANT, BILATERAL Bilateral   . INTRAMEDULLARY (IM) NAIL INTERTROCHANTERIC Right 09/17/2017   Procedure: INTRAMEDULLARY (IM) NAIL INTERTROCHANTRIC HIP;  Surgeon: Nicholes Stairs, MD;  Location: [facility];  Service: Orthopedics;  Laterality: Right;  . OPEN REDUCTION INTERNAL FIXATION (ORIF) DISTAL RADIAL FRACTURE Right 09/17/2017   Procedure: OPEN REDUCTION INTERNAL FIXATION (ORIF) DISTAL RADIAL FRACTURE;  Surgeon: Iran Planas, MD;  Location: [facility];  Service: Orthopedics;   Laterality: Right;  . TRIGGER FINGER RELEASE Left 08/09/2014   Procedure: RELEASE TRIGGER FINGER/A-1 PULLEY LEFT MIDDLE FINGER;  Surgeon: Daryll Brod, MD;  Location: [facility];  Service: Orthopedics;  Laterality: Left;  REGIONAL/FAB   HPI:  85 y.o. female with advanced dementia, asthma was brought to the ER after patient was found to be short of breath.  Per the report patient had 3 episodes of nausea/vomiting. Admitted with acute respiratory failure with hypoxia likely from asthma exacerbation with possible aspiration. CXR = no active disease   Assessment / Plan / Recommendation Clinical Impression  Pt presents with adequate dentition and functional oral motor strength. She reports no difficulty swallowing, and no history of GERD, however, she exhibits marked cognitive impairment (disoriented to time and situation, stating March or April, unsure of the year, and rationale for hospitalization as a fall)). Pt accepted trials of ice chips, thin liquid, nectar thick liquid, puree, and solid textures. Pt was able to self feed after set up, but may benefit from assistance with feeding for energy conservation. Pt exhibited cough response following thin liquids, and was unable to complete the 3oz water challenge. Other consistencies were tolerated without overt s/s aspiration. Recommend mechanical soft diet with nectar thick liquids, meds whole in either thickened liquid or puree. Safe swallow precautions were posted at [facility]. MD and RN were informed of results and recommendations. SLP will follow up at bedside to assess diet tolerance, continue education, and determine if further instrumental workup is warranted.    SLP Visit Diagnosis: Dysphagia, unspecified (R13.10)    Aspiration Risk  Mild aspiration risk    Diet Recommendation Dysphagia 3 (Mech soft);Nectar-thick liquid   Liquid Administration  via: Cup;Straw Medication Administration: (whole meds with nectar thick liquid or  puree) Supervision: Patient able to self feed;Staff to assist with self feeding;Full supervision/cueing for compensatory strategies Compensations: Minimize environmental distractions;Slow rate;Small sips/bites Postural Changes: Seated upright at 90 degrees    Other  Recommendations Oral Care Recommendations: Oral care BID Other Recommendations: Remove water pitcher;Order thickener from pharmacy   Follow up Recommendations 24 hour supervision/assistance;Skilled Nursing facility      Frequency and Duration min 1 x/week  1 week;2 weeks       Prognosis Prognosis for Safe Diet Advancement: Good Barriers to Reach Goals: Cognitive deficits      Swallow Study   General Date of Onset: 01/07/19 HPI: 85 y.o. female with advanced dementia, asthma was brought to the ER after patient was found to be short of breath.  Per the report patient had 3 episodes of nausea/vomiting. Admitted with acute respiratory failure with hypoxia likely from asthma exacerbation with possible aspiration. CXR = no active disease Type of Study: Bedside Swallow Evaluation Previous Swallow Assessment: none Diet Prior to this Study: NPO Temperature Spikes Noted: No Respiratory Status: Nasal cannula History of Recent Intubation: No Behavior/Cognition: Alert;Pleasant mood;Cooperative Oral Cavity Assessment: Within Functional Limits Oral Care Completed by SLP: No Oral Cavity - Dentition: Adequate natural dentition Vision: Functional for self-feeding Self-Feeding Abilities: Able to feed self Patient Positioning: Upright in bed Baseline Vocal Quality: Normal Volitional Cough: Strong Volitional Swallow: Able to elicit    Oral/Motor/Sensory Function Overall Oral Motor/Sensory Function: Within functional limits   Ice Chips Ice chips: Within functional limits Presentation: Spoon   Thin Liquid Thin Liquid: Impaired Pharyngeal  Phase Impairments: Cough - Immediate    Nectar Thick Nectar Thick Liquid: Within functional  limits Presentation: Straw   Honey Thick Honey Thick Liquid: Not tested   Puree Puree: Within functional limits Presentation: Spoon;Self Fed   Solid     Solid: Within functional limits Presentation: [place], [place], [place] Pathologist Office: [phone] Pager: [phone]  Shonna Chock 01/07/2019,10:00 AM

## 2019-01-07 NOTE — Progress Notes (Signed)
Patient placed in observation after midnight, please see H&P.  Here with SOB and nausea and vomiting.  Patient appears comfortable now, no increased work of breathing.  She is very tried but worked with SLP and was able to start diet.  Will monitor for diet tolerance.  Will also monitor O2 saturations, ? Aspiration PNA from the vomiting.  Needs continued monitoring in the hospital- high risk for decompensation. From SNF. Eulogio Bear DO

## 2019-01-07 NOTE — ED Notes (Signed)
ED TO INPATIENT HANDOFF REPORT  ED Nurse Name and Phone #:  Augusta Hilbert [phone]   S Name/Age/Gender Kimberly Greer Room/Bed: 035C/035C  Code Status   Code Status: DNR  Home/SNF/Other SNF Patient oriented to: self, place and situation Is this baseline? Yes    Triage Complete: Triage complete  Chief Complaint [facility]  Triage Note [facility]. Per EMS pt had 3 emesis episodes at nursing facility and then pt become SOB afterward with labored breathing and wheezing. Pt has hx of asthma and COPD. Pt received duo neb x 2 with EMS and 125mg  solumedrol. Hx of dementia, however, is neurologically at baseline per EMS. HR 70 with R BBB BP 150/90 SPO2 100% RA CBG 147 RR 20 TEMP 97.57F oral   Allergies Allergies  Allergen Reactions  . Sulfa Antibiotics Itching, Rash and Other (See Comments)    "Allergic," per MAR  . Amoxicillin Other (See Comments)    Per [facility] 09/15/17 Has patient had a PCN reaction causing immediate rash, facial/tongue/throat swelling, SOB or lightheadedness with hypotension: Unknown Has patient had a PCN reaction causing severe rash involving mucus membranes or skin necrosis: Unknown Has patient had a PCN reaction that required hospitalization: Unknown Has patient had a PCN reaction occurring within the last 10 years: Unknown If all of the above answers are "NO", then may proceed with Cephalosporin use.  . Avelox [Moxifloxacin Hcl In Nacl] Itching  . Doxycycline Other (See Comments)    "Allergic," per MAR  . Hista-Tabs [Triprolidine-Pse] Other (See Comments)    "Passed out"  . Pyrilamine-Phenylephrine Other (See Comments)    "Allergic," per [facility] 09/15/17  . Septra [Sulfamethoxazole-Trimethoprim] Itching, Rash and Other (See Comments)    "Allergic," per MAR    Level of Care/Admitting Diagnosis ED Disposition    ED Disposition Condition [place]: [place] [100100]  Level of Care: Telemetry Medical [104]  I expect the patient will be discharged within 24 hours: No (not a candidate for 5C-Observation unit)  Covid Evaluation: Asymptomatic Screening Protocol (No Symptoms)  Diagnosis: Acute respiratory failure with hypoxia [id number]  Admitting Physician: Rise Patience [phone]  Attending Physician: [name]  PT Class (Do Not Modify): Observation [104]  PT Acc Code (Do Not Modify): Observation [10022]       B Medical/Surgery History Past Medical History:  Diagnosis Date  . Asthma   . Carpal tunnel syndrome 03/07/2009  . Disturbance of skin sensation 02/21/2009  . Diverticulosis of colon (without mention of hemorrhage) 02/16/2009  . Fracture of upper end of left humerus 11/18/14  . 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 Hypothyroid   . Osteoarthrosis, unspecified whether generalized or localized, unspecified site 02/16/2009  . Other abnormal blood chemistry 06/19/2010   hyperglycemia  . Other and unspecified hyperlipidemia 10/24/2009  . Other atopic dermatitis and related conditions 02/16/2009  . Otosclerosis, unspecified 02/22/2003  . Pain in limb 12/05/2009  . Senile osteoporosis 06/16/2010  . Sensorineural hearing loss, unspecified 02/21/2009  . Syncope and collapse 05/30/2009  . Unspecified nasal polyp 02/21/2009  . Unspecified urinary incontinence 02/21/2009   Past Surgical History:  Procedure Laterality Date  . ABDOMINAL HYSTERECTOMY  1999   Dr. Collier Bullock  . BREAST BIOPSY  07/16/1990   benign left breast needle biopsy  Dr. Margot Chimes  . CARPAL TUNNEL RELEASE  04/07/2009   right Dr. Daylene Katayama  . CATARACT EXTRACTION W/ INTRAOCULAR LENS  IMPLANT, BILATERAL Bilateral   . INTRAMEDULLARY (IM) NAIL INTERTROCHANTERIC Right 09/17/2017   Procedure: INTRAMEDULLARY (IM) NAIL INTERTROCHANTRIC HIP;  Surgeon: Nicholes Stairs, MD;  Location: [place];  Service: Orthopedics;  Laterality: Right;  . OPEN REDUCTION INTERNAL FIXATION (ORIF) DISTAL  RADIAL FRACTURE Right 09/17/2017   Procedure: OPEN REDUCTION INTERNAL FIXATION (ORIF) DISTAL RADIAL FRACTURE;  Surgeon: Iran Planas, MD;  Location: [place];  Service: Orthopedics;  Laterality: Right;  . TRIGGER FINGER RELEASE Left 08/09/2014   Procedure: RELEASE TRIGGER FINGER/A-1 PULLEY LEFT MIDDLE FINGER;  Surgeon: Daryll Brod, MD;  Location: [place];  Service: Orthopedics;  Laterality: Left;  REGIONAL/FAB     A IV Location/Drains/Wounds Patient Lines/Drains/Airways Status   Active Line/Drains/Airways    Name:   Placement date:   Placement time:   Site:   Days:   Peripheral IV 01/07/19 Left Antecubital   01/07/19    0133    Antecubital   less than 1   External Urinary Catheter   09/21/17    1000    -   473   Incision (Closed) 08/09/14 Hand Left   08/09/14    1157     1612   Incision (Closed) 09/17/17 Hip Right   09/17/17    1706     477   Incision (Closed) 09/17/17 Wrist Right   09/17/17    1809     477          Intake/Output Last 24 hours No intake or output data in the 24 hours ending 01/07/19 0549  Labs/Imaging Results for orders placed or performed during the hospital encounter of 01/07/19 (from the past 48 hour(s))  Basic metabolic panel     Status: Abnormal   Collection Time: 01/07/19  1:35 AM  Result Value Ref Range   Sodium 141 135 - 145 mmol/L   Potassium 4.0 3.5 - 5.1 mmol/L   Chloride 105 98 - 111 mmol/L   CO2 23 22 - 32 mmol/L   Glucose, Bld 180 (H) 70 - 99 mg/dL   BUN 13 8 - 23 mg/dL   Creatinine, Ser 0.99 0.44 - 1.00 mg/dL   Calcium 9.5 8.9 - 10.3 mg/dL   GFR calc non Af Amer 48 (L) >60 mL/min   GFR calc Af Amer 56 (L) >60 mL/min   Anion gap 13 5 - 15    Comment: Performed at [facility], [address], [place], [place] [address]  CBC with Differential/Platelet     Status: Abnormal   Collection Time: 01/07/19  1:35 AM  Result Value Ref Range   WBC 12.9 (H) 4.0 - 10.5 K/uL   RBC 4.43 3.87 - 5.11 MIL/uL   Hemoglobin 14.0 12.0 - 15.0  g/dL   HCT 45.7 36.0 - 46.0 %   MCV 103.2 (H) 80.0 - 100.0 fL   MCH 31.6 26.0 - 34.0 pg   MCHC 30.6 30.0 - 36.0 g/dL   RDW 17.2 (H) 11.5 - 15.5 %   Platelets 213 150 - 400 K/uL   nRBC 0.0 0.0 - 0.2 %   Neutrophils Relative % 58 %   Neutro Abs 7.5 1.7 - 7.7 K/uL   Lymphocytes Relative 26 %   Lymphs Abs 3.4 0.7 - 4.0 K/uL   Monocytes Relative 9 %   Monocytes Absolute 1.1 (H) 0.1 - 1.0 K/uL   Eosinophils Relative 6 %   Eosinophils Absolute 0.7 (H) 0.0 - 0.5 K/uL   Basophils Relative 1 %  Basophils Absolute 0.1 0.0 - 0.1 K/uL   Immature Granulocytes 0 %   Abs Immature Granulocytes 0.05 0.00 - 0.07 K/uL    Comment: Performed at [facility], [place] [address] [place], [place] [address]   Dg Chest Port 1 View  Result Date: 01/07/2019 CLINICAL DATA:  Shortness of breath EXAM: PORTABLE CHEST 1 VIEW COMPARISON:  September 15, 2017 FINDINGS: The heart size and mediastinal contours are within normal limits. Aortic knob calcifications. Both lungs are clear. The visualized skeletal structures are unremarkable. IMPRESSION: No active disease. Electronically Signed   By: Prudencio Pair M.D.   On: 01/07/2019 01:40   Ct Renal Stone Study  Result Date: 01/07/2019 CLINICAL DATA:  Emesis followed by shortness of breath. EXAM: CT ABDOMEN AND PELVIS WITHOUT CONTRAST TECHNIQUE: Multidetector CT imaging of the abdomen and pelvis was performed following the standard protocol without IV contrast. COMPARISON:  None. FINDINGS: Lower chest: The lung bases are clear without focal nodule, mass, or airspace disease. The heart size is normal. No significant pleural or pericardial effusion is present. Hepatobiliary: No focal liver abnormality is seen. No gallstones, gallbladder wall thickening, or biliary dilatation. Pancreas: Mild pancreatic atrophy is within normal limits for age. No duct dilation is present. No solid or cystic mass lesion is present. No inflammatory changes are present. Spleen: Normal in size without  focal abnormality. Adrenals/Urinary Tract: Adrenal glands are normal bilaterally. Kidneys and ureters are within normal limits. No stone or mass lesion is present. There is no obstruction. The urinary bladder is within normal limits. Stomach/Bowel: Small hiatal hernia is present. Stomach and duodenum are otherwise within normal limits. The small bowel is decompressed distally without a focal transition point. There is some stranding in the proximal mesentery. Terminal ileum is within normal limits. The appendix is visualized and normal. The ascending and transverse colon are within normal limits. The descending and sigmoid colon are normal. Vascular/Lymphatic: Atherosclerotic calcifications are present in the aorta and branch vessels without aneurysm. No significant retroperitoneal adenopathy is present. Reproductive: Status post hysterectomy. No adnexal masses. Other: No abdominal wall hernia or abnormality. No abdominopelvic ascites. Musculoskeletal: Mild degenerative changes are present in the lower lumbar spine. No acute fracture is present. No focal lytic or blastic lesions are present. Right hip ORIF is again noted. IMPRESSION: 1. Mild prominence of the proximal small bowel mesentery is nonspecific. This could represent a proximal enteritis. There is no obstruction. 2. No other acute or focal abnormality to explain the patient's emesis. 3. Small hiatal hernia. 4. Aortic Atherosclerosis (ICD10-I70.0). Electronically Signed   By: San Morelle M.D.   On: 01/07/2019 04:26    Pending Labs Unresulted Labs (From admission, onward)    Start     Ordered   01/07/19 0447  Type and screen [place]  Once,   STAT    Comments: [place]    01/07/19 0447   01/07/19 0446  CBC  Now then every 4 hours,   R (with STAT occurrences)     01/07/19 0447   01/07/19 0445  CBC  (heparin)  Once,   STAT    Comments: Baseline for heparin therapy IF NOT ALREADY DRAWN.  Notify MD if  PLT < 100 K.    01/07/19 0447   01/07/19 0445  Creatinine, serum  (heparin)  Once,   STAT    Comments: Baseline for heparin therapy IF NOT ALREADY DRAWN.    01/07/19 0447   01/07/19 0205  SARS CORONAVIRUS  2 (TAT 6-24 HRS) Nasopharyngeal Nasopharyngeal Swab  (Asymptomatic/Tier 3)  Once,   STAT    Question Answer Comment  Is this test for diagnosis or screening Screening   Symptomatic for COVID-19 as defined by CDC No   Hospitalized for COVID-19 No   Admitted to ICU for COVID-19 No   Previously tested for COVID-19 Yes   Resident in a congregate (group) care setting Yes   Employed in healthcare setting No   Pregnant No      01/07/19 0205          Vitals/Pain Today's Vitals   01/07/19 0506 01/07/19 0530 01/07/19 0545 01/07/19 0547  BP: (!) 101/51 (!) 100/45 (!) 100/47   Pulse: 86 78 86   Resp: 12 15 15    Temp:      TempSrc:      SpO2: 97% 98% 99%   PainSc:    Asleep    Isolation Precautions No active isolations  Medications Medications  aspirin EC tablet 81 mg (has no administration in time range)  escitalopram (LEXAPRO) tablet 20 mg (has no administration in time range)  mirtazapine (REMERON) tablet 15 mg (has no administration in time range)  levothyroxine (SYNTHROID) tablet 25 mcg (has no administration in time range)  famotidine (PEPCID) tablet 20 mg (has no administration in time range)  albuterol (VENTOLIN HFA) 108 (90 Base) MCG/ACT inhaler 2 puff (2 puffs Inhalation Given 01/07/19 0503)  mometasone-formoterol (DULERA) 200-5 MCG/ACT inhaler 2 puff (has no administration in time range)  acetaminophen (TYLENOL) tablet 650 mg (has no administration in time range)    Or  acetaminophen (TYLENOL) suppository 650 mg (has no administration in time range)  ondansetron (ZOFRAN) tablet 4 mg (has no administration in time range)    Or  ondansetron (ZOFRAN) injection 4 mg (has no administration in time range)  heparin injection 5,000 Units (has no administration in time  range)  pantoprazole (PROTONIX) injection 40 mg (has no administration in time range)  albuterol (VENTOLIN HFA) 108 (90 Base) MCG/ACT inhaler 8 puff (8 puffs Inhalation Given 01/07/19 0138)  ondansetron (ZOFRAN) injection 4 mg (4 mg Intravenous Given 01/07/19 0136)  lactated ringers bolus 500 mL (0 mLs Intravenous Stopped 01/07/19 0248)    Mobility non-ambulatory High fall risk   Focused Assessments     R Recommendations: See Admitting Provider Note  Report given to:   Additional Notes:

## 2019-01-08 DIAGNOSIS — J690 Pneumonitis due to inhalation of food and vomit: Secondary | ICD-10-CM | POA: Diagnosis present

## 2019-01-08 DIAGNOSIS — K219 Gastro-esophageal reflux disease without esophagitis: Secondary | ICD-10-CM | POA: Diagnosis present

## 2019-01-08 DIAGNOSIS — Z993 Dependence on wheelchair: Secondary | ICD-10-CM | POA: Diagnosis not present

## 2019-01-08 DIAGNOSIS — T17908S Unspecified foreign body in respiratory tract, part unspecified causing other injury, sequela: Secondary | ICD-10-CM | POA: Diagnosis not present

## 2019-01-08 DIAGNOSIS — I454 Nonspecific intraventricular block: Secondary | ICD-10-CM | POA: Diagnosis present

## 2019-01-08 DIAGNOSIS — J9600 Acute respiratory failure, unspecified whether with hypoxia or hypercapnia: Secondary | ICD-10-CM | POA: Diagnosis present

## 2019-01-08 DIAGNOSIS — Z6831 Body mass index (BMI) 31.0-31.9, adult: Secondary | ICD-10-CM | POA: Diagnosis not present

## 2019-01-08 DIAGNOSIS — E669 Obesity, unspecified: Secondary | ICD-10-CM | POA: Diagnosis present

## 2019-01-08 DIAGNOSIS — M810 Age-related osteoporosis without current pathological fracture: Secondary | ICD-10-CM | POA: Diagnosis present

## 2019-01-08 DIAGNOSIS — Z808 Family history of malignant neoplasm of other organs or systems: Secondary | ICD-10-CM | POA: Diagnosis not present

## 2019-01-08 DIAGNOSIS — J449 Chronic obstructive pulmonary disease, unspecified: Secondary | ICD-10-CM | POA: Diagnosis present

## 2019-01-08 DIAGNOSIS — Z9841 Cataract extraction status, right eye: Secondary | ICD-10-CM | POA: Diagnosis not present

## 2019-01-08 DIAGNOSIS — J9601 Acute respiratory failure with hypoxia: Secondary | ICD-10-CM | POA: Diagnosis present

## 2019-01-08 DIAGNOSIS — R05 Cough: Secondary | ICD-10-CM | POA: Diagnosis not present

## 2019-01-08 DIAGNOSIS — Z90710 Acquired absence of both cervix and uterus: Secondary | ICD-10-CM | POA: Diagnosis not present

## 2019-01-08 DIAGNOSIS — Z66 Do not resuscitate: Secondary | ICD-10-CM | POA: Diagnosis present

## 2019-01-08 DIAGNOSIS — F0390 Unspecified dementia without behavioral disturbance: Secondary | ICD-10-CM | POA: Diagnosis present

## 2019-01-08 DIAGNOSIS — Z20828 Contact with and (suspected) exposure to other viral communicable diseases: Secondary | ICD-10-CM | POA: Diagnosis present

## 2019-01-08 DIAGNOSIS — Z9842 Cataract extraction status, left eye: Secondary | ICD-10-CM | POA: Diagnosis not present

## 2019-01-08 DIAGNOSIS — I739 Peripheral vascular disease, unspecified: Secondary | ICD-10-CM | POA: Diagnosis present

## 2019-01-08 DIAGNOSIS — Z961 Presence of intraocular lens: Secondary | ICD-10-CM | POA: Diagnosis present

## 2019-01-08 DIAGNOSIS — Z800 Family history of malignant neoplasm of digestive organs: Secondary | ICD-10-CM | POA: Diagnosis not present

## 2019-01-08 DIAGNOSIS — F339 Major depressive disorder, recurrent, unspecified: Secondary | ICD-10-CM | POA: Diagnosis present

## 2019-01-08 DIAGNOSIS — Z7401 Bed confinement status: Secondary | ICD-10-CM | POA: Diagnosis not present

## 2019-01-08 DIAGNOSIS — R0902 Hypoxemia: Secondary | ICD-10-CM | POA: Diagnosis not present

## 2019-01-08 DIAGNOSIS — E039 Hypothyroidism, unspecified: Secondary | ICD-10-CM | POA: Diagnosis present

## 2019-01-08 DIAGNOSIS — N179 Acute kidney failure, unspecified: Secondary | ICD-10-CM | POA: Diagnosis not present

## 2019-01-08 DIAGNOSIS — Z818 Family history of other mental and behavioral disorders: Secondary | ICD-10-CM | POA: Diagnosis not present

## 2019-01-08 LAB — CBC
HCT: 38.7 % (ref 36.0–46.0)
Hemoglobin: 12.3 g/dL (ref 12.0–15.0)
MCH: 30 pg (ref 26.0–34.0)
MCHC: 31.8 g/dL (ref 30.0–36.0)
MCV: 94.4 fL (ref 80.0–100.0)
Platelets: 235 10*3/uL (ref 150–400)
RBC: 4.1 MIL/uL (ref 3.87–5.11)
RDW: 13.9 % (ref 11.5–15.5)
WBC: 11.2 10*3/uL — ABNORMAL HIGH (ref 4.0–10.5)
nRBC: 0 % (ref 0.0–0.2)

## 2019-01-08 LAB — BASIC METABOLIC PANEL
Anion gap: 9 (ref 5–15)
BUN: 26 mg/dL — ABNORMAL HIGH (ref 8–23)
CO2: 24 mmol/L (ref 22–32)
Calcium: 9 mg/dL (ref 8.9–10.3)
Chloride: 105 mmol/L (ref 98–111)
Creatinine, Ser: 1.41 mg/dL — ABNORMAL HIGH (ref 0.44–1.00)
GFR calc Af Amer: 37 mL/min — ABNORMAL LOW (ref >60)
GFR calc non Af Amer: 32 mL/min — ABNORMAL LOW (ref >60)
Glucose, Bld: 169 mg/dL — ABNORMAL HIGH (ref 70–99)
Potassium: 4.8 mmol/L (ref 3.5–5.1)
Sodium: 138 mmol/L (ref 135–145)

## 2019-01-08 LAB — MRSA PCR SCREENING: MRSA by PCR: NEGATIVE

## 2019-01-08 MED ORDER — PREDNISONE 20 MG PO TABS
40.0000 mg | ORAL_TABLET | Freq: Every day | ORAL | Status: DC
  Administered 2019-01-09: 40 mg via ORAL
  Filled 2019-01-08: qty 2

## 2019-01-08 MED ORDER — SODIUM CHLORIDE 0.9 % IV SOLN
INTRAVENOUS | Status: DC
  Administered 2019-01-08 – 2019-01-09 (×2): via INTRAVENOUS

## 2019-01-08 MED ORDER — PANTOPRAZOLE SODIUM 40 MG PO TBEC
40.0000 mg | DELAYED_RELEASE_TABLET | Freq: Two times a day (BID) | ORAL | Status: DC
  Administered 2019-01-08 – 2019-01-09 (×3): 40 mg via ORAL
  Filled 2019-01-08 (×3): qty 1

## 2019-01-08 NOTE — Progress Notes (Addendum)
  Speech Language Pathology Treatment: Dysphagia  Patient Details Name: Kimberly Greer MRN: [id number] DOB: 30-Nov-1921 Today's Date: 01/08/2019 Time: [id number] SLP Time Calculation (min) (ACUTE ONLY): 9 min  Assessment / Plan / Recommendation Clinical Impression  Pt with increased level of alertness from 01/07/19.  Today pt tolerated thin liquid by cup and straw with no clinical s/s of aspiration including with serial straw sips.  Pt exhibited prompt oral clearance of regular solids and no overt s/s of aspiration.  Pt would like to return to regular diet and thin liquids, which she states is her baseline. SLP will follow up for diet tolerance with advancement. Recommend regular texture diet with thin liquid.    HPI HPI: 85 y.o. female with advanced dementia, asthma was brought to the ER after patient was found to be short of breath.  Per the report patient had 3 episodes of nausea/vomiting. Admitted with acute respiratory failure with hypoxia likely from asthma exacerbation with possible aspiration. CXR = no active disease      SLP Plan  Continue with current plan of care       Recommendations  Diet recommendations: Regular;Thin liquid Liquids provided via: Cup;Straw Medication Administration: Whole meds with liquid Supervision: Patient able to self feed;Intermittent supervision to cue for compensatory strategies Compensations: Slow rate;Small sips/bites Postural Changes and/or Swallow Maneuvers: Seated upright 90 degrees                Oral Care Recommendations: Oral care BID Follow up Recommendations: (Continue ST at next level of care) SLP Visit Diagnosis: Dysphagia, unspecified (R13.10) Plan: Continue with current plan of care       [place], [place], [place] Office: [phone]  01/08/2019, 10:59 AM

## 2019-01-08 NOTE — TOC Initial Note (Signed)
Transition of Care [facility]) - Initial/Assessment Note    Patient Details  Name: Kimberly Greer MRN: [id number] Date of Birth: 02-05-1922  Transition of [facility] CM/SW Contact:    Eileen Stanford, LCSW Phone Number: 01/08/2019, 3:46 PM  Clinical Narrative:        Pt is from [facility]. Pt was private paying for rehab bed. Son has been in contact with facility. Pt will d/c back tomorrow. Facility has everything other than d/c summary. CSW will fax summary tomorrow once provided.           Expected Discharge Plan: Skilled Nursing Facility Barriers to Discharge: Continued Medical Work up   Patient Goals and CMS Choice        Expected Discharge Plan and Services Expected Discharge Plan: [place] In-house Referral: Clinical Social Work   Post Acute Care Choice: [place] Living arrangements for the past 2 months: [place]                                      Prior Living Arrangements/Services Living arrangements for the past 2 months: [place] Lives with:: Self Patient language and need for interpreter reviewed:: Yes Do you feel safe going back to the place where you live?: Yes      Need for Family Participation in Patient Care: Yes (Comment) Care giver support system in place?: Yes (comment)   Criminal Activity/Legal Involvement Pertinent to Current Situation/Hospitalization: No - Comment as needed  Activities of Daily Living      Permission Sought/Granted Permission sought to share information with : Family Supports                Emotional Assessment Appearance:: Appears stated age Attitude/Demeanor/Rapport: Unable to Assess Affect (typically observed): Unable to Assess Orientation: : Oriented to Place, Oriented to Self Alcohol / Substance Use: Not Applicable Psych Involvement: No (comment)  Admission diagnosis:  Hypoxia [R09.02] Aspiration pneumonia, unspecified aspiration  pneumonia type, unspecified laterality, unspecified part of lung ([place]) [J69.0] Acute respiratory failure with hypoxia ([place]) [J96.01] Patient Active Problem List   Diagnosis Date Noted  . Acute respiratory failure ([place]) 01/08/2019  . Acute respiratory failure with hypoxia ([place]) 01/07/2019  . DNR (do not resuscitate) 01/07/2019  . Fracture, humerus closed 07/10/2018  . Chronic rhinitis 04/10/2018  . Iron deficiency anemia 03/17/2018  . Constipation 02/17/2018  . Cognitive impairment 09/22/2017  . Goals of care, counseling/discussion   . Fall   . Closed fracture of right distal radius   . Hip fracture ([place]) 09/15/2017  . Closed displaced intertrochanteric fracture of right femur ([place]) 09/15/2017  . COPD (chronic obstructive pulmonary disease) ([place]) 09/15/2017  . PVD (peripheral vascular disease) ([place]) 08/25/2017  . Depression, recurrent ([place]) 05/29/2017  . Sinus bradycardia 08/26/2016  . Major depression, chronic 08/26/2016  . Adult failure to thrive 05/10/2016  . Bilateral knee pain 04/25/2015  . Edema 01/03/2015  . GERD (gastroesophageal reflux disease) 11/25/2014  . Osteoporosis 11/25/2014  . Abnormality of gait 03/08/2014  . Trigger finger, acquired 03/08/2014  . Dyspnea 02/09/2013  . Intrinsic asthma 02/09/2013  . Hypothyroid   . Hyperglycemia 06/19/2010  . Hyperlipemia 10/24/2009  . Sensorineural hearing loss 02/21/2009  . OAB (overactive bladder) 02/16/2009  . Osteoarthritis 02/16/2009   PCP:  Mast, Man X, NP Pharmacy:   [place], [place]  [place] [address] Phone: [phone] Fax: [phone]     Social Determinants of Health (SDOH) Interventions    Readmission Risk Interventions No flowsheet data found.

## 2019-01-08 NOTE — Progress Notes (Signed)
Patient titrated off oxygen this morning and remains with O2sats around 90-92% on room remainder of day. No complaints of shortness of breath.

## 2019-01-08 NOTE — NC FL2 (Signed)
[place] LEVEL OF CARE SCREENING TOOL     IDENTIFICATION  Patient Name: Kimberly Greer Birthdate: 12/24/1921 Sex: female Admission Date (Current Location): 01/07/2019  [facility] and [place] Number:  Herbalist and Address:  [facility] [place], [place] [address] [place], [place] [address]      Provider Number: [id number]  Attending Physician Name and Address:  Geradine Girt, DO  Relative Name and Phone Number:       Current Level of Care: Hospital Recommended Level of Care: [place] Prior Approval Number:    Date Approved/Denied:   PASRR Number:    Discharge Plan: SNF    Current Diagnoses: Patient Active Problem List   Diagnosis Date Noted  . Acute respiratory failure ([place]) 01/08/2019  . Acute respiratory failure with hypoxia ([place]) 01/07/2019  . DNR (do not resuscitate) 01/07/2019  . Fracture, humerus closed 07/10/2018  . Chronic rhinitis 04/10/2018  . Iron deficiency anemia 03/17/2018  . Constipation 02/17/2018  . Cognitive impairment 09/22/2017  . Goals of care, counseling/discussion   . Fall   . Closed fracture of right distal radius   . Hip fracture ([place]) 09/15/2017  . Closed displaced intertrochanteric fracture of right femur ([place]) 09/15/2017  . COPD (chronic obstructive pulmonary disease) ([place]) 09/15/2017  . PVD (peripheral vascular disease) ([place]) 08/25/2017  . Depression, recurrent ([place]) 05/29/2017  . Sinus bradycardia 08/26/2016  . Major depression, chronic 08/26/2016  . Adult failure to thrive 05/10/2016  . Bilateral knee pain 04/25/2015  . Edema 01/03/2015  . GERD (gastroesophageal reflux disease) 11/25/2014  . Osteoporosis 11/25/2014  . Abnormality of gait 03/08/2014  . Trigger finger, acquired 03/08/2014  . Dyspnea 02/09/2013  . Intrinsic asthma 02/09/2013  . Hypothyroid   . Hyperglycemia 06/19/2010  . Hyperlipemia 10/24/2009  . Sensorineural hearing loss 02/21/2009  . OAB (overactive  bladder) 02/16/2009  . Osteoarthritis 02/16/2009    Orientation RESPIRATION BLADDER Height & Weight     Self, Place  O2(Nasal Cannula 2L) Incontinent Weight: 138 lb 7.2 oz (62.8 kg) Height:  4\' 8"  (142.2 cm)  BEHAVIORAL SYMPTOMS/MOOD NEUROLOGICAL BOWEL NUTRITION STATUS      Incontinent Diet(regular diet thin liquids)  AMBULATORY STATUS COMMUNICATION OF NEEDS Skin   Limited Assist Verbally Normal                       Personal Care Assistance Level of Assistance  Bathing, Feeding, Dressing Bathing Assistance: Limited assistance Feeding assistance: Independent Dressing Assistance: Limited assistance     Functional Limitations Info  Sight, Hearing, Speech Sight Info: Adequate Hearing Info: Adequate Speech Info: Adequate    SPECIAL CARE FACTORS FREQUENCY  PT (By licensed PT), OT (By licensed OT)     PT Frequency: 3z OT Frequency: 3z            Contractures Contractures Info: Not present    Additional Factors Info  Code Status, Allergies Code Status Info: DNR Allergies Info: Sulfa Antibiotics, Amoxicillin, Avelox (Moxifloxacin Hcl In Nacl), Doxycycline, Hista-tabs (Triprolidine-pse), Pyrilamine-phenylephrine, Septra (Sulfamethoxazole-trimethoprim)           Current Medications (01/08/2019):  This is the current hospital active medication list Current Facility-Administered Medications  Medication Dose Route Frequency Provider Last Rate Last Dose  . acetaminophen (TYLENOL) tablet 650 mg  650 mg Oral Q6H PRN Rise Patience, MD       Or  . acetaminophen (TYLENOL) suppository 650 mg  650 mg Rectal Q6H PRN Gean Birchwood  N, MD      . albuterol (PROVENTIL) (2.5 MG/3ML) 0.083% nebulizer solution 2.5 mg  2.5 mg Inhalation Q12H Vann, Jessica U, DO   2.5 mg at 01/08/19 0943  . [START ON 01/09/2019] aspirin EC tablet 81 mg  81 mg Oral Once per day on Tue Sat Rise Patience, MD      . escitalopram (LEXAPRO) tablet 20 mg  20 mg Oral Daily Rise Patience, MD   20 mg at 01/08/19 0845  . famotidine (PEPCID) tablet 20 mg  20 mg Oral Daily Rise Patience, MD   20 mg at 01/08/19 0845  . heparin injection 5,000 Units  5,000 Units Subcutaneous Q8H Rise Patience, MD   5,000 Units at 01/08/19 [phone]  . levothyroxine (SYNTHROID) tablet 25 mcg  25 mcg Oral QAC breakfast Rise Patience, MD   25 mcg at 01/08/19 0558  . mirtazapine (REMERON) tablet 15 mg  15 mg Oral QHS Rise Patience, MD   15 mg at 01/07/19 2144  . mometasone-formoterol (DULERA) 200-5 MCG/ACT inhaler 2 puff  2 puff Inhalation BID Rise Patience, MD   2 puff at 01/08/19 0950  . ondansetron (ZOFRAN) tablet 4 mg  4 mg Oral Q6H PRN Rise Patience, MD       Or  . ondansetron [facility]) injection 4 mg  4 mg Intravenous Q6H PRN Rise Patience, MD      . pantoprazole (PROTONIX) EC tablet 40 mg  40 mg Oral BID Eulogio Bear U, DO   40 mg at 01/08/19 0845  . [START ON 01/09/2019] predniSONE (DELTASONE) tablet 40 mg  40 mg Oral Q breakfast Geradine Girt, DO         Discharge Medications: Please see discharge summary for a list of discharge medications.  Relevant Imaging Results:  Relevant Lab Results:   Additional Information SSN:[id number]  Eileen Stanford, LCSW

## 2019-01-08 NOTE — Plan of Care (Signed)
°  Problem: Coping: °Goal: Level of anxiety will decrease °Outcome: Progressing °  °

## 2019-01-08 NOTE — Progress Notes (Addendum)
Progress Note    MONIFAH NARDIELLO  [id number] DOB: 07/15/1921  DOA: 01/07/2019 PCP: Mast, Man X, NP    Brief Narrative:    Medical records reviewed and are as summarized below:  MYLEE BOWICK is an 85 y.o. female from [place] with h/o asthma was brought to the ER after patient was found to be short of breath.  Per the report patient had 3 episodes of nausea vomiting which was dark-colored.  Following which patient became short of breath and had some labored breathing.  EMS on arrival was found to be wheezing.  Was brought to the ER.  Assessment/Plan:   Active Problems:   Acute respiratory failure with hypoxia (HCC)   DNR (do not resuscitate)   Acute respiratory failure ([place])   Acute respiratory failure with hypoxia likely from aspiration pneumonitis - Chest x-ray not showing PNA   -wean IV Solumedrol to PO and taper - nebs PRN -passes diet eval  Nausea vomiting with possible enteritis seen in the CAT scan.   -tolerating diet well  History of dementia  no acute issues at this time.  Spoke with son, at baseline she walks < 50 steps before fatiguing; mentally usually sharp, spends most of her days in bed or a wheelchair  AKI -gentle IVF overnight -BMP in AM  obesity Body mass index is 31.04 kg/m.   Family Communication/Anticipated D/C date and plan/Code Status   DVT prophylaxis: heparin Code Status: dnr Family Communication: spoke with son Disposition Plan: pending return to [place] Consultants:   sLP  Subjective:   In bed, drinking her thickened fluid and not liking it  Objective:    Vitals:   01/08/19 0544 01/08/19 0545 01/08/19 0943 01/08/19 1303  BP:    (!) 125/59  Pulse: 81   82  Resp: 18   16  Temp:  97.9 F (36.6 C)  97.8 F (36.6 C)  TempSrc:  Oral  Oral  SpO2: 100%  100% 95%  Weight: 62.8 kg     Height:        Intake/Output Summary (Last 24 hours) at 01/08/2019 1356 Last data filed at 01/08/2019 [id number]  Gross per 24 hour  Intake 160 ml  Output -  Net 160 ml   Filed Weights   01/07/19 0653 01/08/19 0544  Weight: 63.3 kg 62.8 kg    Exam: In bed, pleasant and cooperative On O2 currently (does not wear at home) No wheezing auscultated rrr   Data Reviewed:   I have personally reviewed following labs and imaging studies:  Labs: Labs show the following:   Basic Metabolic Panel: Recent Labs  Lab 01/07/19 0135 01/07/19 0605 01/08/19 0357  NA 141  --  138  K 4.0  --  4.8  CL 105  --  105  CO2 23  --  24  GLUCOSE 180*  --  169*  BUN 13  --  26*  CREATININE 0.99 1.17* 1.41*  CALCIUM 9.5  --  9.0   GFR Estimated Creatinine Clearance: 17.7 mL/min (A) (by C-G formula based on SCr of 1.41 mg/dL (H)). Liver Function Tests: No results for input(s): AST, ALT, ALKPHOS, BILITOT, PROT, ALBUMIN in the last 168 hours. No results for input(s): LIPASE, AMYLASE in the last 168 hours. No results for input(s): AMMONIA in the last 168 hours. Coagulation profile No results for input(s): INR, PROTIME in the last 168 hours.  CBC: Recent Labs  Lab 01/07/19 0135 01/07/19 0605 01/07/19  [id number] 01/08/19 0357  WBC 12.9* 7.9 6.9 11.2*  NEUTROABS 7.5  --   --   --   HGB 14.0 13.5 13.2 12.3  HCT 45.7 42.3 41.2 38.7  MCV 103.2* 94.2 93.4 94.4  PLT 213 259 258 235   Cardiac Enzymes: No results for input(s): CKTOTAL, CKMB, CKMBINDEX, TROPONINI in the last 168 hours. BNP (last 3 results) No results for input(s): PROBNP in the last 8760 hours. CBG: No results for input(s): GLUCAP in the last 168 hours. D-Dimer: No results for input(s): DDIMER in the last 72 hours. Hgb A1c: No results for input(s): HGBA1C in the last 72 hours. Lipid Profile: No results for input(s): CHOL, HDL, LDLCALC, TRIG, CHOLHDL, LDLDIRECT in the last 72 hours. Thyroid function studies: No results for input(s): TSH, T4TOTAL, T3FREE, THYROIDAB in the last 72 hours.  Invalid input(s): FREET3 Anemia work up: No  results for input(s): VITAMINB12, FOLATE, FERRITIN, TIBC, IRON, RETICCTPCT in the last 72 hours. Sepsis Labs: Recent Labs  Lab 01/07/19 0135 01/07/19 0605 01/07/19 0737 01/08/19 0357  WBC 12.9* 7.9 6.9 11.2*    Microbiology Recent Results (from the past 240 hour(s))  SARS CORONAVIRUS 2 (TAT 6-24 HRS) Nasopharyngeal Nasopharyngeal Swab     Status: None   Collection Time: 01/07/19  2:51 AM   Specimen: Nasopharyngeal Swab  Result Value Ref Range Status   SARS Coronavirus 2 NEGATIVE NEGATIVE Final    Comment: (NOTE) SARS-CoV-2 target nucleic acids are NOT DETECTED. The SARS-CoV-2 RNA is generally detectable in upper and lower respiratory specimens during the acute phase of infection. Negative results do not preclude SARS-CoV-2 infection, do not rule out co-infections with other pathogens, and should not be used as the sole basis for treatment or other patient management decisions. Negative results must be combined with clinical observations, patient history, and epidemiological information. The expected result is Negative. Fact Sheet for Patients: SugarRoll.be Fact Sheet for Healthcare Providers: https://www.woods-mathews.com/ This test is not yet approved or cleared by the [place] FDA and  has been authorized for detection and/or diagnosis of SARS-CoV-2 by FDA under an Emergency Use Authorization (EUA). This EUA will remain  in effect (meaning this test can be used) for the duration of the COVID-19 declaration under Section 56 4(b)(1) of the Act, 21 U.S.C. section 360bbb-3(b)(1), unless the authorization is terminated or revoked sooner. Performed at [facility], [place] [address] [place], [place] [address]   MRSA PCR Screening     Status: None   Collection Time: 01/08/19  1:43 AM   Specimen: Nasal Mucosa; Nasopharyngeal  Result Value Ref Range Status   MRSA by PCR NEGATIVE NEGATIVE Final    Comment:        The GeneXpert  MRSA Assay (FDA approved for NASAL specimens only), is one component of a comprehensive MRSA colonization surveillance program. It is not intended to diagnose MRSA infection nor to guide or monitor treatment for MRSA infections. Performed at [facility], [place] [address], [place], [place] [address]     Procedures and diagnostic studies:  Dg Chest Port 1 View  Result Date: 01/07/2019 CLINICAL DATA:  Shortness of breath EXAM: PORTABLE CHEST 1 VIEW COMPARISON:  September 15, 2017 FINDINGS: The heart size and mediastinal contours are within normal limits. Aortic knob calcifications. Both lungs are clear. The visualized skeletal structures are unremarkable. IMPRESSION: No active disease. Electronically Signed   By: Prudencio Pair M.D.   On: 01/07/2019 01:40   Ct Renal Stone Study  Result Date:  01/07/2019 CLINICAL DATA:  Emesis followed by shortness of breath. EXAM: CT ABDOMEN AND PELVIS WITHOUT CONTRAST TECHNIQUE: Multidetector CT imaging of the abdomen and pelvis was performed following the standard protocol without IV contrast. COMPARISON:  None. FINDINGS: Lower chest: The lung bases are clear without focal nodule, mass, or airspace disease. The heart size is normal. No significant pleural or pericardial effusion is present. Hepatobiliary: No focal liver abnormality is seen. No gallstones, gallbladder wall thickening, or biliary dilatation. Pancreas: Mild pancreatic atrophy is within normal limits for age. No duct dilation is present. No solid or cystic mass lesion is present. No inflammatory changes are present. Spleen: Normal in size without focal abnormality. Adrenals/Urinary Tract: Adrenal glands are normal bilaterally. Kidneys and ureters are within normal limits. No stone or mass lesion is present. There is no obstruction. The urinary bladder is within normal limits. Stomach/Bowel: Small hiatal hernia is present. Stomach and duodenum are otherwise within normal limits. The small bowel is  decompressed distally without a focal transition point. There is some stranding in the proximal mesentery. Terminal ileum is within normal limits. The appendix is visualized and normal. The ascending and transverse colon are within normal limits. The descending and sigmoid colon are normal. Vascular/Lymphatic: Atherosclerotic calcifications are present in the aorta and branch vessels without aneurysm. No significant retroperitoneal adenopathy is present. Reproductive: Status post hysterectomy. No adnexal masses. Other: No abdominal wall hernia or abnormality. No abdominopelvic ascites. Musculoskeletal: Mild degenerative changes are present in the lower lumbar spine. No acute fracture is present. No focal lytic or blastic lesions are present. Right hip ORIF is again noted. IMPRESSION: 1. Mild prominence of the proximal small bowel mesentery is nonspecific. This could represent a proximal enteritis. There is no obstruction. 2. No other acute or focal abnormality to explain the patient's emesis. 3. Small hiatal hernia. 4. Aortic Atherosclerosis (ICD10-I70.0). Electronically Signed   By: San Morelle M.D.   On: 01/07/2019 04:26    Medications:   . albuterol  2.5 mg Inhalation Q12H  . [START ON 01/09/2019] aspirin EC  81 mg Oral Once per day on Tue Sat  . escitalopram  20 mg Oral Daily  . famotidine  20 mg Oral Daily  . heparin  5,000 Units Subcutaneous Q8H  . levothyroxine  25 mcg Oral QAC breakfast  . mirtazapine  15 mg Oral QHS  . mometasone-formoterol  2 puff Inhalation BID  . pantoprazole  40 mg Oral BID  . [START ON 01/09/2019] predniSONE  40 mg Oral Q breakfast   Continuous Infusions:   LOS: 0 days   Geradine Girt  [facility]   How to contact the [facility] Attending or Consulting provider [place] or covering provider during after hours [place], for this patient?  1. Check the care team in [facility] and look for a) attending/consulting TRH provider listed and b) the [facility] team listed 2.  Log into [email or web address] and use [place]'s universal password to access. If you do not have the password, please contact the hospital operator. 3. Locate the [facility] provider you are looking for under [facility] and page to a number that you can be directly reached. 4. If you still have difficulty reaching the provider, please page the [facility] (Director on Call) for the Hospitalists listed on amion for assistance.  01/08/2019, 1:56 PM

## 2019-01-09 ENCOUNTER — Inpatient Hospital Stay (HOSPITAL_COMMUNITY): Payer: Medicare Other

## 2019-01-09 DIAGNOSIS — T17908S Unspecified foreign body in respiratory tract, part unspecified causing other injury, sequela: Secondary | ICD-10-CM

## 2019-01-09 LAB — BASIC METABOLIC PANEL
Anion gap: 7 (ref 5–15)
BUN: 25 mg/dL — ABNORMAL HIGH (ref 8–23)
CO2: 27 mmol/L (ref 22–32)
Calcium: 9 mg/dL (ref 8.9–10.3)
Chloride: 109 mmol/L (ref 98–111)
Creatinine, Ser: 1.05 mg/dL — ABNORMAL HIGH (ref 0.44–1.00)
GFR calc Af Amer: 52 mL/min — ABNORMAL LOW (ref >60)
GFR calc non Af Amer: 45 mL/min — ABNORMAL LOW (ref >60)
Glucose, Bld: 108 mg/dL — ABNORMAL HIGH (ref 70–99)
Potassium: 4.1 mmol/L (ref 3.5–5.1)
Sodium: 143 mmol/L (ref 135–145)

## 2019-01-09 MED ORDER — PREDNISONE 20 MG PO TABS: 40.0000 mg | ORAL_TABLET | Freq: Every day | ORAL | 0 refills | Status: DC

## 2019-01-09 NOTE — TOC Transition Note (Addendum)
Transition of Care [facility]) - CM/SW Discharge Note   Patient Details  Name: Kimberly Greer MRN: [id number] Date of Birth: Feb 12, 1921  Transition of [facility]) CM/SW Contact:  Bary Castilla, LCSW Phone Number: [phone] 01/09/2019, 1:58 PM   Clinical Narrative:     Patient is from [facility] and is returning to facility.  Patient will DC to:?[facility] Anticipated DC date:?01/09/19 Family notified: Son? Transport by: [name]   Per MD patient ready for DC to [facility] RN, patient, patient's family, and facility notified of DC. Discharge Summary sent to facility. RN given number for report [id number] ext. [phone] room 25.. DC packet on chart. Ambulance transport requested for patient.  CSW signing off.   [name], [name] [phone]      Barriers to Discharge: Continued Medical Work up   Patient Goals and CMS Choice        Discharge Placement                       Discharge Plan and Services In-house Referral: Clinical Social Work   Post Acute Care Choice: [place]                               Social Determinants of Health (SDOH) Interventions     Readmission Risk Interventions No flowsheet data found.

## 2019-01-09 NOTE — Discharge Summary (Signed)
Physician Discharge Summary  Kimberly Greer [id number] DOB: 1921-05-30 DOA: 01/07/2019  PCP: Mast, Man X, NP  Admit date: 01/07/2019 Discharge date: 01/09/2019  Admitted From: snf Discharge disposition: snf   Recommendations for Outpatient Follow-Up:   1. Prednisone through Dec 8th and then stop 2. Resume O2 (in the hospital, she needed it more at night) 3. Can consider changing lasix to PRN or every other day pending hydration status   Discharge Diagnosis:   Active Problems:   Acute respiratory failure with hypoxia (HCC)   DNR (do not resuscitate)   Acute respiratory failure ([place])    Discharge Condition: Improved.  Diet recommendation:   Regular.  Wound care: resume prior  Code status: DNR   History of Present Illness:   Kimberly Greer is a 85 y.o. female with advanced dementia, asthma was brought to the ER after patient was found to be short of breath.  Per the report patient had 3 episodes of nausea vomiting which was dark-colored.  Following which patient became short of breath and had some labored breathing.  EMS on arrival was found to be wheezing.  Was brought to the ER.  ED Course: In the ER patient is hypoxic mildly with wheezing was placed on nebulizer.  Chest x-ray was unremarkable.  EKG showed tachycardia.  Labs showed WBC of 12.9 180 was a blood sugar and CT abdomen pelvis done showed mild prominence of the proximal bowel which could be from possible enteritis.  Patient admitted for further observation.  COVID-19 test is pending.  Blood pressure was mildly low on presentation which improved with fluids.   Hospital Course by Problem:   Acute respiratory failure with hypoxia likely from aspiration pneumonitis -Chest x-ray not showing PNA -- after hydration, repeat x ray did not show any PNA -wean IV Solumedrol to PO prednisone through Dec 8th - nebs PRN  Nausea vomiting with possible enteritis seen in the CAT scan.  -tolerating diet  well now  History of dementia  no acute issues at this time. Spoke with son, at baseline she walks < 50 steps before fatiguing; mentally usually sharp, spends most of her days in bed or a wheelchair  AKI -resolved overnight  obesity Body mass index is 31.04 kg/m.    Medical Consultants:      Discharge Exam:   Vitals:   01/09/19 0810 01/09/19 0819  BP: (!) 144/76   Pulse: 73 67  Resp: 20 15  Temp:    SpO2: 91% 93%   Vitals:   01/08/19 2101 01/09/19 0442 01/09/19 0810 01/09/19 0819  BP:  139/69 (!) 144/76   Pulse:  68 73 67  Resp:  13 20 15   Temp:  98.1 F (36.7 C)    TempSrc:  Oral    SpO2: 98% 90% 91% 93%  Weight:  63.4 kg    Height:        General exam: Appears calm and comfortable. Eating well, no complaints  The results of significant diagnostics from this hospitalization (including imaging, microbiology, ancillary and laboratory) are listed below for reference.     Procedures and Diagnostic Studies:   Dg Chest Port 1 View  Result Date: 01/07/2019 CLINICAL DATA:  Shortness of breath EXAM: PORTABLE CHEST 1 VIEW COMPARISON:  September 15, 2017 FINDINGS: The heart size and mediastinal contours are within normal limits. Aortic knob calcifications. Both lungs are clear. The visualized skeletal structures are unremarkable. IMPRESSION: No active disease. Electronically Signed   By: Kerby Moors  [place]u M.D.   On: 01/07/2019 01:40   Ct Renal Stone Study  Result Date: 01/07/2019 CLINICAL DATA:  Emesis followed by shortness of breath. EXAM: CT ABDOMEN AND PELVIS WITHOUT CONTRAST TECHNIQUE: Multidetector CT imaging of the abdomen and pelvis was performed following the standard protocol without IV contrast. COMPARISON:  None. FINDINGS: Lower chest: The lung bases are clear without focal nodule, mass, or airspace disease. The heart size is normal. No significant pleural or pericardial effusion is present. Hepatobiliary: No focal liver abnormality is seen. No gallstones,  gallbladder wall thickening, or biliary dilatation. Pancreas: Mild pancreatic atrophy is within normal limits for age. No duct dilation is present. No solid or cystic mass lesion is present. No inflammatory changes are present. Spleen: Normal in size without focal abnormality. Adrenals/Urinary Tract: Adrenal glands are normal bilaterally. Kidneys and ureters are within normal limits. No stone or mass lesion is present. There is no obstruction. The urinary bladder is within normal limits. Stomach/Bowel: Small hiatal hernia is present. Stomach and duodenum are otherwise within normal limits. The small bowel is decompressed distally without a focal transition point. There is some stranding in the proximal mesentery. Terminal ileum is within normal limits. The appendix is visualized and normal. The ascending and transverse colon are within normal limits. The descending and sigmoid colon are normal. Vascular/Lymphatic: Atherosclerotic calcifications are present in the aorta and branch vessels without aneurysm. No significant retroperitoneal adenopathy is present. Reproductive: Status post hysterectomy. No adnexal masses. Other: No abdominal wall hernia or abnormality. No abdominopelvic ascites. Musculoskeletal: Mild degenerative changes are present in the lower lumbar spine. No acute fracture is present. No focal lytic or blastic lesions are present. Right hip ORIF is again noted. IMPRESSION: 1. Mild prominence of the proximal small bowel mesentery is nonspecific. This could represent a proximal enteritis. There is no obstruction. 2. No other acute or focal abnormality to explain the patient's emesis. 3. Small hiatal hernia. 4. Aortic Atherosclerosis (ICD10-I70.0). Electronically Signed   By: San Morelle M.D.   On: 01/07/2019 04:26     Labs:   Basic Metabolic Panel: Recent Labs  Lab 01/07/19 0135 01/07/19 0605 01/08/19 0357 01/09/19 0335  NA 141  --  138 143  K 4.0  --  4.8 4.1  CL 105  --  105  109  CO2 23  --  24 27  GLUCOSE 180*  --  169* 108*  BUN 13  --  26* 25*  CREATININE 0.99 1.17* 1.41* 1.05*  CALCIUM 9.5  --  9.0 9.0   GFR Estimated Creatinine Clearance: 23.8 mL/min (A) (by C-G formula based on SCr of 1.05 mg/dL (H)). Liver Function Tests: No results for input(s): AST, ALT, ALKPHOS, BILITOT, PROT, ALBUMIN in the last 168 hours. No results for input(s): LIPASE, AMYLASE in the last 168 hours. No results for input(s): AMMONIA in the last 168 hours. Coagulation profile No results for input(s): INR, PROTIME in the last 168 hours.  CBC: Recent Labs  Lab 01/07/19 0135 01/07/19 0605 01/07/19 0737 01/08/19 0357  WBC 12.9* 7.9 6.9 11.2*  NEUTROABS 7.5  --   --   --   HGB 14.0 13.5 13.2 12.3  HCT 45.7 42.3 41.2 38.7  MCV 103.2* 94.2 93.4 94.4  PLT 213 259 258 235   Cardiac Enzymes: No results for input(s): CKTOTAL, CKMB, CKMBINDEX, TROPONINI in the last 168 hours. BNP: Invalid input(s): POCBNP CBG: No results for input(s): GLUCAP in the last 168 hours. D-Dimer No results for input(s): DDIMER in the  last 72 hours. Hgb A1c No results for input(s): HGBA1C in the last 72 hours. Lipid Profile No results for input(s): CHOL, HDL, LDLCALC, TRIG, CHOLHDL, LDLDIRECT in the last 72 hours. Thyroid function studies No results for input(s): TSH, T4TOTAL, T3FREE, THYROIDAB in the last 72 hours.  Invalid input(s): FREET3 Anemia work up No results for input(s): VITAMINB12, FOLATE, FERRITIN, TIBC, IRON, RETICCTPCT in the last 72 hours. Microbiology Recent Results (from the past 240 hour(s))  SARS CORONAVIRUS 2 (TAT 6-24 HRS) Nasopharyngeal Nasopharyngeal Swab     Status: None   Collection Time: 01/07/19  2:51 AM   Specimen: Nasopharyngeal Swab  Result Value Ref Range Status   SARS Coronavirus 2 NEGATIVE NEGATIVE Final    Comment: (NOTE) SARS-CoV-2 target nucleic acids are NOT DETECTED. The SARS-CoV-2 RNA is generally detectable in upper and lower respiratory specimens  during the acute phase of infection. Negative results do not preclude SARS-CoV-2 infection, do not rule out co-infections with other pathogens, and should not be used as the sole basis for treatment or other patient management decisions. Negative results must be combined with clinical observations, patient history, and epidemiological information. The expected result is Negative. Fact Sheet for Patients: SugarRoll.be Fact Sheet for Healthcare Providers: https://www.woods-mathews.com/ This test is not yet approved or cleared by the [place] FDA and  has been authorized for detection and/or diagnosis of SARS-CoV-2 by FDA under an Emergency Use Authorization (EUA). This EUA will remain  in effect (meaning this test can be used) for the duration of the COVID-19 declaration under Section 56 4(b)(1) of the Act, 21 U.S.C. section 360bbb-3(b)(1), unless the authorization is terminated or revoked sooner. Performed at [facility], [place] [address], [place], [place] [address]   MRSA PCR Screening     Status: None   Collection Time: 01/08/19  1:43 AM   Specimen: Nasal Mucosa; Nasopharyngeal  Result Value Ref Range Status   MRSA by PCR NEGATIVE NEGATIVE Final    Comment:        The GeneXpert MRSA Assay (FDA approved for NASAL specimens only), is one component of a comprehensive MRSA colonization surveillance program. It is not intended to diagnose MRSA infection nor to guide or monitor treatment for MRSA infections. Performed at [facility], [place] [address], [place], [place] [address]      Discharge Instructions:   Discharge Instructions    Diet general   Complete by: As directed    Increase activity slowly   Complete by: As directed      Allergies as of 01/09/2019      Reactions   Sulfa Antibiotics Itching, Rash, Other (See Comments)   "Allergic," per MAR   Amoxicillin Other (See Comments)   Per [facility] 09/15/17 Has  patient had a PCN reaction causing immediate rash, facial/tongue/throat swelling, SOB or lightheadedness with hypotension: Unknown Has patient had a PCN reaction causing severe rash involving mucus membranes or skin necrosis: Unknown Has patient had a PCN reaction that required hospitalization: Unknown Has patient had a PCN reaction occurring within the last 10 years: Unknown If all of the above answers are "NO", then may proceed with Cephalosporin use.   Avelox [moxifloxacin Hcl In Nacl] Itching   Doxycycline Other (See Comments)   "Allergic," per MAR   Hista-tabs [triprolidine-pse] Other (See Comments)   "Passed out"   Pyrilamine-phenylephrine Other (See Comments)   "Allergic," per [facility] 09/15/17   Septra [sulfamethoxazole-trimethoprim] Itching, Rash, Other (See Comments)   "Allergic," per [facility]  Medication List    TAKE these medications   acetaminophen 325 MG tablet Commonly known as: TYLENOL Take 650 mg by mouth every 6 (six) hours as needed (for pain).   aspirin EC 81 MG tablet Take 81 mg by mouth See admin instructions. Take 81 mg by mouth in the morning on Tuesday and Saturday   escitalopram 20 MG tablet Commonly known as: LEXAPRO Take 20 mg by mouth daily.   famotidine 20 MG tablet Commonly known as: PEPCID Take 20 mg by mouth daily.   Fluticasone-Salmeterol 250-50 MCG/DOSE Aepb Commonly known as: ADVAIR Inhale 1 puff 2 (two) times daily into the lungs. Rinse mouth after use   furosemide 20 MG tablet Commonly known as: LASIX Take 20 mg by mouth See admin instructions. Take 20 mg by mouth in the morning and hold for SBP < 110   ipratropium 0.02 % nebulizer solution Commonly known as: ATROVENT Take 0.5 mg by nebulization every 6 (six) hours as needed for wheezing or shortness of breath.   lactose free nutrition Liqd Take 237 mLs by mouth See admin instructions. Add 237 ml's to ice cream and consume two times a day   levothyroxine 25 MCG tablet Commonly known as:  SYNTHROID Take 25 mcg by mouth daily before breakfast.   mirtazapine 15 MG tablet Commonly known as: REMERON Take 15 mg by mouth daily.   OXYGEN Inhale 2 L/min into the lungs as needed (to keep O2 SATS >90%).   polyethylene glycol 17 g packet Commonly known as: MIRALAX / GLYCOLAX Take 17 g by mouth daily. Mix with 6-8 ounces of fluid of choice   predniSONE 20 MG tablet Commonly known as: DELTASONE Take 2 tablets (40 mg total) by mouth daily with breakfast. Start taking on: January 10, 2019   raloxifene 60 MG tablet Commonly known as: EVISTA Take 1 tablet by mouth  daily to treat osteoporosis What changed: See the new instructions.   Ventolin HFA 108 (90 Base) MCG/ACT inhaler Generic drug: albuterol Inhale 2 puffs into the lungs every 6 (six) hours as needed for shortness of breath (dyspnea).   zinc oxide 20 % ointment Apply to buttocks as needed every shift after incontinent episodes      Follow-up Information    Mast, Man X, NP Follow up in 1 week(s).   Specialty: Internal Medicine Contact information: [id number] [place] [address] [id number]            Time coordinating discharge: 35 min  Signed:  Geradine Girt DO  [facility] 01/09/2019, 12:17 PM

## 2019-01-09 NOTE — Progress Notes (Signed)
Telephoned [facility] and report given to [facility] for rm #[id number] that will be receiving pt back to facility. Copy of discharge AVS faxed to her.

## 2019-01-11 ENCOUNTER — Non-Acute Institutional Stay (SKILLED_NURSING_FACILITY): Payer: Medicare Other | Admitting: Internal Medicine

## 2019-01-11 ENCOUNTER — Encounter: Payer: Self-pay | Admitting: Internal Medicine

## 2019-01-11 DIAGNOSIS — J209 Acute bronchitis, unspecified: Secondary | ICD-10-CM | POA: Diagnosis not present

## 2019-01-11 DIAGNOSIS — E039 Hypothyroidism, unspecified: Secondary | ICD-10-CM

## 2019-01-11 DIAGNOSIS — R609 Edema, unspecified: Secondary | ICD-10-CM | POA: Diagnosis not present

## 2019-01-11 DIAGNOSIS — F329 Major depressive disorder, single episode, unspecified: Secondary | ICD-10-CM | POA: Diagnosis not present

## 2019-01-11 DIAGNOSIS — M810 Age-related osteoporosis without current pathological fracture: Secondary | ICD-10-CM | POA: Diagnosis not present

## 2019-01-11 NOTE — Progress Notes (Signed)
Location: [facility] Room Number: 25 Place of Service:  SNF (31)  Provider:   Code Status:  Goals of Care:  Advanced Directives 01/07/2019  Does Patient Have a Medical Advance Directive? Unable to assess, patient is non-responsive or altered mental status  Type of Advance Directive -  Does patient want to make changes to medical advance directive? -  Copy of [place] in Chart? -  Pre-existing out of facility DNR order (yellow form or pink MOST form) -     Chief Complaint  Patient presents with   New Admit To SNF    Readmission    HPI: Patient is a 85 y.o. female seen today for an acute visit for Readmit to the SNF after staying in the hospital from 12/03-12/05 for acute respiratory failure with hypoxia  Patient is a long-term care resident Patient has h/o Sinus Bradycardia, LE edema, Hypothyroidism, Depression, Chronic Sinusitis, H/o Hip Fracture,And  Proximal Humerus Fracture due to Fall with Delayed healing  She was sent to the emergency room from the facility as she was complaining of shortness of breath.  She also had 3 episodes of  vomiting.  Her Covid test was negative in the hospital.  Her chest x-ray did not show any acute changes.  Her CT abdomen pelvis showed mild enteritis.  She was treated with IV Solu-Medrol which was later changed to prednisone.  Her Diet was  increased as tolerated. She was discharged back to the facility Patient did not have any acute complaints today denied any shortness of breath cough nausea or vomiting.  She is already eating and not requiring any oxygen.  No Fever  Past Medical History:  Diagnosis Date   Asthma    Carpal tunnel syndrome 03/07/2009   Disturbance of skin sensation 02/21/2009   Diverticulosis of colon (without mention of hemorrhage) 02/16/2009   Fracture of upper end of left humerus 11/18/14   Hypertonicity of bladder 02/16/2009   Hypothyroid    Osteoarthrosis, unspecified  whether generalized or localized, unspecified site 02/16/2009   Other abnormal blood chemistry 06/19/2010   hyperglycemia   Other and unspecified hyperlipidemia 10/24/2009   Other atopic dermatitis and related conditions 02/16/2009   Otosclerosis, unspecified 02/22/2003   Pain in limb 12/05/2009   Senile osteoporosis 06/16/2010   Sensorineural hearing loss, unspecified 02/21/2009   Syncope and collapse 05/30/2009   Unspecified nasal polyp 02/21/2009   Unspecified urinary incontinence 02/21/2009    Past Surgical History:  Procedure Laterality Date   ABDOMINAL HYSTERECTOMY  1999   Dr. Collier Bullock   BREAST BIOPSY  07/16/1990   benign left breast needle biopsy  Dr. Margot Chimes   CARPAL TUNNEL RELEASE  04/07/2009   right Dr. Daylene Katayama   CATARACT EXTRACTION W/ INTRAOCULAR LENS  IMPLANT, BILATERAL Bilateral    INTRAMEDULLARY (IM) NAIL INTERTROCHANTERIC Right 09/17/2017   Procedure: INTRAMEDULLARY (IM) NAIL INTERTROCHANTRIC HIP;  Surgeon: Nicholes Stairs, MD;  Location: [facility];  Service: Orthopedics;  Laterality: Right;   OPEN REDUCTION INTERNAL FIXATION (ORIF) DISTAL RADIAL FRACTURE Right 09/17/2017   Procedure: OPEN REDUCTION INTERNAL FIXATION (ORIF) DISTAL RADIAL FRACTURE;  Surgeon: Iran Planas, MD;  Location: [facility];  Service: Orthopedics;  Laterality: Right;   TRIGGER FINGER RELEASE Left 08/09/2014   Procedure: RELEASE TRIGGER FINGER/A-1 PULLEY LEFT MIDDLE FINGER;  Surgeon: Daryll Brod, MD;  Location: [place];  Service: Orthopedics;  Laterality: Left;  REGIONAL/FAB    Allergies  Allergen Reactions   Sulfa Antibiotics Itching,  Rash and Other (See Comments)    "Allergic," per [facility]   Amoxicillin Other (See Comments)    Per [facility] 09/15/17 Has patient had a PCN reaction causing immediate rash, facial/tongue/throat swelling, SOB or lightheadedness with hypotension: Unknown Has patient had a PCN reaction causing severe rash involving mucus membranes or skin necrosis: Unknown Has  patient had a PCN reaction that required hospitalization: Unknown Has patient had a PCN reaction occurring within the last 10 years: Unknown If all of the above answers are "NO", then may proceed with Cephalosporin use.   Avelox [Moxifloxacin Hcl In Nacl] Itching   Doxycycline Other (See Comments)    "Allergic," per [facility]   Hista-Tabs [Triprolidine-Pse] Other (See Comments)    "Passed out"   Pyrilamine-Phenylephrine Other (See Comments)    "Allergic," per [facility] 09/15/17   Septra [Sulfamethoxazole-Trimethoprim] Itching, Rash and Other (See Comments)    "Allergic," per [facility]    Outpatient Encounter Medications as of 01/11/2019  Medication Sig   acetaminophen (TYLENOL) 325 MG tablet Take 650 mg by mouth every 6 (six) hours as needed (for pain).    albuterol (VENTOLIN HFA) 108 (90 Base) MCG/ACT inhaler Inhale 2 puffs into the lungs every 6 (six) hours as needed for shortness of breath (dyspnea).    aspirin EC 81 MG tablet Take 81 mg by mouth See admin instructions. Take 81 mg by mouth in the morning on Tuesday and Saturday   escitalopram (LEXAPRO) 20 MG tablet Take 20 mg by mouth daily.   famotidine (PEPCID) 20 MG tablet Take 20 mg by mouth daily.   Fluticasone-Salmeterol (ADVAIR) 250-50 MCG/DOSE AEPB Inhale 1 puff 2 (two) times daily into the lungs. Rinse mouth after use   furosemide (LASIX) 20 MG tablet Take 20 mg by mouth See admin instructions. Take 20 mg by mouth in the morning and hold for SBP < 110   ipratropium (ATROVENT) 0.02 % nebulizer solution Take 0.5 mg by nebulization every 6 (six) hours as needed for wheezing or shortness of breath.   lactose free nutrition (BOOST PLUS) LIQD Take 237 mLs by mouth See admin instructions. Add 237 ml's to ice cream and consume two times a day   levothyroxine (SYNTHROID, LEVOTHROID) 25 MCG tablet Take 25 mcg by mouth daily before breakfast.   mirtazapine (REMERON) 15 MG tablet Take 15 mg by mouth daily.    OXYGEN Inhale 2 L/min into the  lungs as needed (to keep O2 SATS >90%).    polyethylene glycol (MIRALAX / GLYCOLAX) packet Take 17 g by mouth daily. Mix with 6-8 ounces of fluid of choice   raloxifene (EVISTA) 60 MG tablet Take 1 tablet by mouth  daily to treat osteoporosis (Patient taking differently: Take 60 mg by mouth daily. )   zinc oxide 20 % ointment Apply to buttocks as needed every shift after incontinent episodes   [DISCONTINUED] predniSONE (DELTASONE) 20 MG tablet Take 2 tablets (40 mg total) by mouth daily with breakfast.   No facility-administered encounter medications on file as of 01/11/2019.     Review of Systems:  Review of Systems  Review of Systems  Constitutional: Negative for activity change, appetite change, chills, diaphoresis, fatigue and fever.  HENT: Negative for mouth sores, postnasal drip, rhinorrhea, sinus pain and sore throat.   Respiratory: Negative for apnea, cough, chest tightness, shortness of breath and wheezing.   Cardiovascular: Negative for chest pain, palpitations and leg swelling.  Gastrointestinal: Negative for abdominal distention, abdominal pain, constipation, diarrhea, nausea and vomiting.  Genitourinary: Negative for  dysuria and frequency.  Musculoskeletal: Negative for arthralgias, joint swelling and myalgias.  Skin: Negative for rash.  Neurological: Negative for dizziness, syncope, weakness, light-headedness and numbness.  Psychiatric/Behavioral: Negative for behavioral problems, confusion and sleep disturbance.     Health Maintenance  Topic Date Due   TETANUS/TDAP  06/04/2018   INFLUENZA VACCINE  09/05/2018   DEXA SCAN  Completed   PNA vac Low Risk Adult  Completed    Physical Exam: Vitals:   01/11/19 1423  BP: (!) 154/73  Pulse: 72  Resp: 18  Temp: (!) 97 F (36.1 C)  SpO2: 97%  Weight: 138 lb 9.6 oz (62.9 kg)  Height: 4\' 8"  (1.422 m)   Body mass index is 31.07 kg/m. Physical Exam  Constitutional:  Well-developed and well-nourished.  HENT:    Head: Normocephalic.  Mouth/Throat: Oropharynx is clear and moist.  Eyes: Pupils are equal, round, and reactive to light.  Neck: Neck supple.  Cardiovascular: Normal rate and normal heart sounds.  No murmur heard. Pulmonary/Chest: Effort normal and breath sounds normal. No respiratory distress. No wheezes. She has no rales.  Abdominal: Soft. Bowel sounds are normal. No distension. There is no tenderness. There is no rebound.  Musculoskeletal: No edema.  Lymphadenopathy: none Neurological: No Focal Deficits Skin: Skin is warm and dry.  Psychiatric: Normal mood and affect. Behavior is normal. Thought content normal.    Labs reviewed: Basic Metabolic Panel: Recent Labs    05/11/18  01/07/19 0135 01/07/19 0605 01/08/19 0357 01/09/19 0335  NA  --    < > 141  --  138 143  K  --    < > 4.0  --  4.8 4.1  CL  --   --  105  --  105 109  CO2  --   --  23  --  24 27  GLUCOSE  --   --  180*  --  169* 108*  BUN  --    < > 13  --  26* 25*  CREATININE  --    < > 0.99 1.17* 1.41* 1.05*  CALCIUM  --   --  9.5  --  9.0 9.0  TSH 1.62  --   --   --   --   --    < > = values in this interval not displayed.   Liver Function Tests: Recent Labs    03/19/18 06/17/18  AST 14 14  ALT 11 11  ALKPHOS  --  45   No results for input(s): LIPASE, AMYLASE in the last 8760 hours. No results for input(s): AMMONIA in the last 8760 hours. CBC: Recent Labs    01/07/19 0135 01/07/19 0605 01/07/19 0737 01/08/19 0357  WBC 12.9* 7.9 6.9 11.2*  NEUTROABS 7.5  --   --   --   HGB 14.0 13.5 13.2 12.3  HCT 45.7 42.3 41.2 38.7  MCV 103.2* 94.2 93.4 94.4  PLT 213 259 258 235   Lipid Panel: No results for input(s): CHOL, HDL, LDLCALC, TRIG, CHOLHDL, LDLDIRECT in the last 8760 hours. Lab Results  Component Value Date   HGBA1C 6.0 06/17/2018    Procedures since last visit: Dg Chest 1 View  Result Date: 01/09/2019 CLINICAL DATA:  Cough, aspiration EXAM: CHEST  1 VIEW COMPARISON:  01/07/2019 FINDINGS:  Lungs are clear.  No pleural effusion or pneumothorax. The heart is normal in size. IMPRESSION: No evidence of acute cardiopulmonary disease. Electronically Signed   By: Julian Hy M.D.   On:  01/09/2019 11:40   Dg Chest Port 1 View  Result Date: 01/07/2019 CLINICAL DATA:  Shortness of breath EXAM: PORTABLE CHEST 1 VIEW COMPARISON:  September 15, 2017 FINDINGS: The heart size and mediastinal contours are within normal limits. Aortic knob calcifications. Both lungs are clear. The visualized skeletal structures are unremarkable. IMPRESSION: No active disease. Electronically Signed   By: Prudencio Pair M.D.   On: 01/07/2019 01:40   Ct Renal Stone Study  Result Date: 01/07/2019 CLINICAL DATA:  Emesis followed by shortness of breath. EXAM: CT ABDOMEN AND PELVIS WITHOUT CONTRAST TECHNIQUE: Multidetector CT imaging of the abdomen and pelvis was performed following the standard protocol without IV contrast. COMPARISON:  None. FINDINGS: Lower chest: The lung bases are clear without focal nodule, mass, or airspace disease. The heart size is normal. No significant pleural or pericardial effusion is present. Hepatobiliary: No focal liver abnormality is seen. No gallstones, gallbladder wall thickening, or biliary dilatation. Pancreas: Mild pancreatic atrophy is within normal limits for age. No duct dilation is present. No solid or cystic mass lesion is present. No inflammatory changes are present. Spleen: Normal in size without focal abnormality. Adrenals/Urinary Tract: Adrenal glands are normal bilaterally. Kidneys and ureters are within normal limits. No stone or mass lesion is present. There is no obstruction. The urinary bladder is within normal limits. Stomach/Bowel: Small hiatal hernia is present. Stomach and duodenum are otherwise within normal limits. The small bowel is decompressed distally without a focal transition point. There is some stranding in the proximal mesentery. Terminal ileum is within normal  limits. The appendix is visualized and normal. The ascending and transverse colon are within normal limits. The descending and sigmoid colon are normal. Vascular/Lymphatic: Atherosclerotic calcifications are present in the aorta and branch vessels without aneurysm. No significant retroperitoneal adenopathy is present. Reproductive: Status post hysterectomy. No adnexal masses. Other: No abdominal wall hernia or abnormality. No abdominopelvic ascites. Musculoskeletal: Mild degenerative changes are present in the lower lumbar spine. No acute fracture is present. No focal lytic or blastic lesions are present. Right hip ORIF is again noted. IMPRESSION: 1. Mild prominence of the proximal small bowel mesentery is nonspecific. This could represent a proximal enteritis. There is no obstruction. 2. No other acute or focal abnormality to explain the patient's emesis. 3. Small hiatal hernia. 4. Aortic Atherosclerosis (ICD10-I70.0). Electronically Signed   By: San Morelle M.D.   On: 01/07/2019 04:26    Assessment/Plan  Acute Over Chronic bronchitis Continue Prednisone for 3 more days On Advair  Edema, unspecified type On Low dose of Lasix Her Bun and Creat were high in the hospital Will repeat in few days   Age-related osteoporosis  On Evista  Major depression, chronic Continue on Lexapro and Remeron  Acquired hypothyroidism TSH normal in 4/20    Labs/tests ordered:  * No order type specified * Next appt:  Visit date not found  Total time spent in this patient care encounter was  45_  minutes; greater than 50% of the visit spent counseling patient and staff, reviewing records , Labs and coordinating care for problems addressed at this encounter.

## 2019-02-10 ENCOUNTER — Encounter: Payer: Self-pay | Admitting: Internal Medicine

## 2019-02-10 ENCOUNTER — Non-Acute Institutional Stay (SKILLED_NURSING_FACILITY): Payer: Medicare Other | Admitting: Internal Medicine

## 2019-02-10 DIAGNOSIS — E039 Hypothyroidism, unspecified: Secondary | ICD-10-CM | POA: Diagnosis not present

## 2019-02-10 DIAGNOSIS — R609 Edema, unspecified: Secondary | ICD-10-CM | POA: Diagnosis not present

## 2019-02-10 DIAGNOSIS — M810 Age-related osteoporosis without current pathological fracture: Secondary | ICD-10-CM | POA: Diagnosis not present

## 2019-02-10 DIAGNOSIS — J42 Unspecified chronic bronchitis: Secondary | ICD-10-CM

## 2019-02-10 DIAGNOSIS — F329 Major depressive disorder, single episode, unspecified: Secondary | ICD-10-CM

## 2019-02-10 NOTE — Progress Notes (Signed)
Location:   [place] Room Number: 25 Place of Service:  SNF [phone]) Provider:  Veleta Miners MD  Mast, Man X, NP  Patient Care Team: Mast, Man X, NP as PCP - General (Internal Medicine) Newton Pigg, MD as Consulting Physician (Obstetrics and Gynecology) Monna Fam, MD as Consulting Physician (Ophthalmology) Melida Quitter, MD as Consulting Physician (Otolaryngology) Myrlene Broker, MD as Consulting Physician (Urology) [place], [facility] Virgie Dad, MD as Consulting Physician (Internal Medicine)  Extended Emergency Contact Information Primary Emergency Contact: Zambito,[name] Address: [place], [place] Phone: [phone] Work Phone: [phone] Mobile Phone: [phone] Relation: Son Secondary Emergency Contact: Teofilo Pod [place] of [place] Phone: [phone] Work Phone: [phone] Relation: Friend  Code Status:  DNR Goals of care: Advanced Directive information Advanced Directives 02/10/2019  Does Patient Have a Medical Advance Directive? Yes  Type of Advance Directive Out of facility DNR (pink MOST or yellow form);Living will;Healthcare Power of Attorney  Does patient want to make changes to medical advance directive? No - Patient declined  Copy of [place] in Chart? Yes - validated most recent copy scanned in chart (See row information)  Pre-existing out of facility DNR order (yellow form or pink MOST form) Yellow form placed in chart (order not valid for inpatient use);Pink MOST form placed in chart (order not valid for inpatient use)     Chief Complaint  Patient presents with  . Medical Management of Chronic Issues  . Health Maintenance    TDAP    HPI:  Pt is a 86 y.o. female seen today for medical management of chronic diseases.   Patient is a long-term care resident Patient has h/o Sinus Bradycardia, LE edema, Hypothyroidism,  Depression, Chronic Sinusitis, H/o Hip Fracture,And  Proximal Humerus Fracture due to Fall with Delayed healing Was recently admitted for Acute Bronchitis  She is Stable in facility. No New issues. No Coughing or SOB.  Has gained weight. Appetite is fair   Past Medical History:  Diagnosis Date  . Asthma   . Carpal tunnel syndrome 03/07/2009  . Disturbance of skin sensation 02/21/2009  . Diverticulosis of colon (without mention of hemorrhage) 02/16/2009  . Fracture of upper end of left humerus 11/18/14  . 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 Hypothyroid   . Osteoarthrosis, unspecified whether generalized or localized, unspecified site 02/16/2009  . Other abnormal blood chemistry 06/19/2010   hyperglycemia  . Other and unspecified hyperlipidemia 10/24/2009  . Other atopic dermatitis and related conditions 02/16/2009  . Otosclerosis, unspecified 02/22/2003  . Pain in limb 12/05/2009  . Senile osteoporosis 06/16/2010  . Sensorineural hearing loss, unspecified 02/21/2009  . Syncope and collapse 05/30/2009  . Unspecified nasal polyp 02/21/2009  . Unspecified urinary incontinence 02/21/2009   Past Surgical History:  Procedure Laterality Date  . ABDOMINAL HYSTERECTOMY  1999   Dr. Collier Bullock  . BREAST BIOPSY  07/16/1990   benign left breast needle biopsy  Dr. Margot Chimes  . CARPAL TUNNEL RELEASE  04/07/2009   right Dr. Daylene Katayama  . CATARACT EXTRACTION W/ INTRAOCULAR LENS  IMPLANT, BILATERAL Bilateral   . INTRAMEDULLARY (IM) NAIL INTERTROCHANTERIC Right 09/17/2017   Procedure: INTRAMEDULLARY (IM) NAIL INTERTROCHANTRIC HIP;  Surgeon: Nicholes Stairs, MD;  Location: [place];  Service: Orthopedics;  Laterality: Right;  . OPEN REDUCTION INTERNAL FIXATION (ORIF) DISTAL RADIAL FRACTURE Right 09/17/2017   Procedure: OPEN REDUCTION  INTERNAL FIXATION (ORIF) DISTAL RADIAL FRACTURE;  Surgeon: Iran Planas, MD;  Location: [place];  Service: Orthopedics;  Laterality: Right;  . TRIGGER FINGER RELEASE Left 08/09/2014    Procedure: RELEASE TRIGGER FINGER/A-1 PULLEY LEFT MIDDLE FINGER;  Surgeon: Daryll Brod, MD;  Location: [place];  Service: Orthopedics;  Laterality: Left;  REGIONAL/FAB    Allergies  Allergen Reactions  . Sulfa Antibiotics Itching, Rash and Other (See Comments)    "Allergic," per MAR  . Amoxicillin Other (See Comments)    Per [facility] 09/15/17 Has patient had a PCN reaction causing immediate rash, facial/tongue/throat swelling, SOB or lightheadedness with hypotension: Unknown Has patient had a PCN reaction causing severe rash involving mucus membranes or skin necrosis: Unknown Has patient had a PCN reaction that required hospitalization: Unknown Has patient had a PCN reaction occurring within the last 10 years: Unknown If all of the above answers are "NO", then may proceed with Cephalosporin use.  . Avelox [Moxifloxacin Hcl In Nacl] Itching  . Doxycycline Other (See Comments)    "Allergic," per MAR  . Hista-Tabs [Triprolidine-Pse] Other (See Comments)    "Passed out"  . Pyrilamine-Phenylephrine Other (See Comments)    "Allergic," per [facility] 09/15/17  . Septra [Sulfamethoxazole-Trimethoprim] Itching, Rash and Other (See Comments)    "Allergic," per MAR    Allergies as of 02/10/2019      Reactions   Sulfa Antibiotics Itching, Rash, Other (See Comments)   "Allergic," per MAR   Amoxicillin Other (See Comments)   Per [facility] 09/15/17 Has patient had a PCN reaction causing immediate rash, facial/tongue/throat swelling, SOB or lightheadedness with hypotension: Unknown Has patient had a PCN reaction causing severe rash involving mucus membranes or skin necrosis: Unknown Has patient had a PCN reaction that required hospitalization: Unknown Has patient had a PCN reaction occurring within the last 10 years: Unknown If all of the above answers are "NO", then may proceed with Cephalosporin use.   Avelox [moxifloxacin Hcl In Nacl] Itching   Doxycycline Other (See Comments)   "Allergic," per  MAR   Hista-tabs [triprolidine-pse] Other (See Comments)   "Passed out"   Pyrilamine-phenylephrine Other (See Comments)   "Allergic," per [facility] 09/15/17   Septra [sulfamethoxazole-trimethoprim] Itching, Rash, Other (See Comments)   "Allergic," per [facility]      Medication List       Accurate as of February 10, 2019 10:05 AM. If you have any questions, ask your nurse or doctor.        acetaminophen 325 MG tablet Commonly known as: TYLENOL Take 650 mg by mouth every 6 (six) hours as needed (for pain).   aspirin EC 81 MG tablet Take 81 mg by mouth See admin instructions. Take 81 mg by mouth in the morning on Tuesday and Saturday   escitalopram 20 MG tablet Commonly known as: LEXAPRO Take 20 mg by mouth daily.   famotidine 20 MG tablet Commonly known as: PEPCID Take 20 mg by mouth daily.   Fluticasone-Salmeterol 250-50 MCG/DOSE Aepb Commonly known as: ADVAIR Inhale 1 puff 2 (two) times daily into the lungs. Rinse mouth after use   furosemide 20 MG tablet Commonly known as: LASIX Take 20 mg by mouth See admin instructions. Take 20 mg by mouth in the morning and hold for SBP < 110   ipratropium 0.02 % nebulizer solution Commonly known as: ATROVENT Take 0.5 mg by nebulization every 6 (six) hours as needed for wheezing or shortness of breath.   lactose free nutrition Liqd Take 237 mLs  by mouth See admin instructions. Add 237 ml's to ice cream and consume two times a day   levothyroxine 25 MCG tablet Commonly known as: SYNTHROID Take 25 mcg by mouth daily before breakfast.   mirtazapine 15 MG tablet Commonly known as: REMERON Take 15 mg by mouth daily.   OXYGEN Inhale 2 L/min into the lungs as needed (to keep O2 SATS >90%).   polyethylene glycol 17 g packet Commonly known as: MIRALAX / GLYCOLAX Take 17 g by mouth daily. Mix with 6-8 ounces of fluid of choice   raloxifene 60 MG tablet Commonly known as: EVISTA Take 1 tablet by mouth  daily to treat osteoporosis What  changed: See the new instructions.   Ventolin HFA 108 (90 Base) MCG/ACT inhaler Generic drug: albuterol Inhale 2 puffs into the lungs every 6 (six) hours as needed for shortness of breath (dyspnea).   zinc oxide 20 % ointment Apply to buttocks as needed every shift after incontinent episodes       Review of Systems  Review of Systems  Constitutional: Negative for activity change, appetite change, chills, diaphoresis, fatigue and fever.  HENT: Negative for mouth sores, postnasal drip, rhinorrhea, sinus pain and sore throat.   Respiratory: Negative for apnea, cough, chest tightness, shortness of breath and wheezing.   Cardiovascular: Negative for chest pain, palpitations and leg swelling.  Gastrointestinal: Negative for abdominal distention, abdominal pain, constipation, diarrhea, nausea and vomiting.  Genitourinary: Negative for dysuria and frequency.  Musculoskeletal: Negative for arthralgias, joint swelling and myalgias.  Skin: Negative for rash.  Neurological: Negative for dizziness, syncope, weakness, light-headedness and numbness.  Psychiatric/Behavioral: Negative for behavioral problems, confusion and sleep disturbance.     Immunization History  Administered Date(s) Administered  . Influenza Whole 12/27/2011, 11/05/2012  . Influenza, High Dose Seasonal PF 11/19/2018  . Influenza-Unspecified 11/18/2013, 11/03/2014, 11/16/2015, 11/13/2016, 11/17/2017  . PPD Test 04/04/2008, 11/23/2014  . Pneumococcal Conjugate-13 09/30/2016  . Pneumococcal Polysaccharide-23 02/05/2008, 06/03/2008  . Td 02/05/2008, 06/03/2008   Pertinent  Health Maintenance Due  Topic Date Due  . INFLUENZA VACCINE  Completed  . DEXA SCAN  Completed  . PNA vac Low Risk Adult  Completed   Fall Risk  10/09/2017 09/23/2016 08/27/2016 05/30/2015 05/16/2015  Falls in the past year? Yes Yes Yes Yes No  Comment - - Emmi Telephone Survey: data to providers prior to load - -  Number falls in past yr: 2 or more 1 1 1  -   Comment - - Emmi Telephone Survey Actual Response = 1 - -  Injury with Fall? Yes No No Yes -  Comment broke R wrist - - - -  Risk Factor Category  - - - - -  Risk for fall due to : - - - History of fall(s);Impaired balance/gait;Impaired mobility -  Follow up - - - Falls evaluation completed;Education provided -   Functional Status Survey:    Vitals:   02/10/19 0959  BP: 112/60  Pulse: (!) 56  Resp: 18  Temp: (!) 97.2 F (36.2 C)  SpO2: 95%  Weight: 141 lb 4.8 oz (64.1 kg)  Height: 4\' 8"  (1.422 m)   Body mass index is 31.68 kg/m. Physical Exam  Constitutional: Oriented to person, place, and time. Well-developed and well-nourished.  HENT:  Head: Normocephalic.  Mouth/Throat: Oropharynx is clear and moist.  Eyes: Pupils are equal, round, and reactive to light.  Neck: Neck supple.  Cardiovascular: Normal rate and normal heart sounds.  No murmur heard. Pulmonary/Chest: Effort normal and  breath sounds normal. No respiratory distress. No wheezes. She has no rales.  Abdominal: Soft. Bowel sounds are normal. No distension. There is no tenderness. There is no rebound.  Musculoskeletal: No edema.  Lymphadenopathy: none Neurological: Alert and oriented to person, place, and time.  Skin: Skin is warm and dry.  Psychiatric: Normal mood and affect. Behavior is normal. Thought content normal.    Labs reviewed: Recent Labs    01/07/19 0135 01/07/19 0605 01/08/19 0357 01/09/19 0335  NA 141  --  138 143  K 4.0  --  4.8 4.1  CL 105  --  105 109  CO2 23  --  24 27  GLUCOSE 180*  --  169* 108*  BUN 13  --  26* 25*  CREATININE 0.99 1.17* 1.41* 1.05*  CALCIUM 9.5  --  9.0 9.0   Recent Labs    03/19/18 0000 06/17/18 0000  AST 14 14  ALT 11 11  ALKPHOS  --  45   Recent Labs    01/07/19 0135 01/07/19 0605 01/07/19 0737 01/08/19 0357  WBC 12.9* 7.9 6.9 11.2*  NEUTROABS 7.5  --   --   --   HGB 14.0 13.5 13.2 12.3  HCT 45.7 42.3 41.2 38.7  MCV 103.2* 94.2 93.4 94.4    PLT 213 259 258 235   Lab Results  Component Value Date   TSH 1.62 05/11/2018   Lab Results  Component Value Date   HGBA1C 6.0 06/17/2018   Lab Results  Component Value Date   CHOL 211 (A) 02/14/2014   HDL 47 02/14/2014   LDLCALC 133 02/14/2014   TRIG 155 02/14/2014    Significant Diagnostic Results in last 30 days:  No results found.  Assessment/Plan  Chronic bronchitis On Advair  Edema, unspecified type On lasix Age-related osteoporosis  Doing well on Evista  Major depression, chronic Continue Lexapro and Remeron  Acquired hypothyroidism TSH normal in 4/20 Same dose of Synthroid CKD Stable Creat     Family/ staff Communication:   Labs/tests ordered:    Total time spent in this patient care encounter was  25_  minutes; greater than 50% of the visit spent counseling patient and staff, reviewing records , Labs and coordinating care for problems addressed at this encounter.

## 2019-02-17 ENCOUNTER — Non-Acute Institutional Stay (SKILLED_NURSING_FACILITY): Payer: Medicare Other | Admitting: Internal Medicine

## 2019-02-17 ENCOUNTER — Encounter: Payer: Self-pay | Admitting: General Surgery

## 2019-02-17 ENCOUNTER — Encounter: Payer: Self-pay | Admitting: Internal Medicine

## 2019-02-17 DIAGNOSIS — H1033 Unspecified acute conjunctivitis, bilateral: Secondary | ICD-10-CM

## 2019-02-17 NOTE — Progress Notes (Deleted)
Location:    [place] Room Number: 25 Place of Service:  SNF [phone]) Provider:  Veleta Miners, MD   Mast, Man X, NP  Patient Care Team: Mast, Man X, NP as PCP - General (Internal Medicine) Newton Pigg, MD as Consulting Physician (Obstetrics and Gynecology) Monna Fam, MD as Consulting Physician (Ophthalmology) Melida Quitter, MD as Consulting Physician (Otolaryngology) Myrlene Broker, MD as Consulting Physician (Urology) [place], [facility] Virgie Dad, MD as Consulting Physician (Internal Medicine)  Extended Emergency Contact Information Primary Emergency Contact: Laboy,[name] Address: [place], [place] Phone: [phone] Work Phone: [phone] Mobile Phone: [phone] Relation: Son Secondary Emergency Contact: Teofilo Pod [place] of [place] Phone: [phone] Work Phone: [phone] Relation: Friend  Code Status:  DNR Goals of care: Advanced Directive information Advanced Directives 02/10/2019  Does Patient Have a Medical Advance Directive? Yes  Type of Advance Directive Out of facility DNR (pink MOST or yellow form);Living will;Healthcare Power of Attorney  Does patient want to make changes to medical advance directive? No - Patient declined  Copy of [place] in Chart? Yes - validated most recent copy scanned in chart (See row information)  Pre-existing out of facility DNR order (yellow form or pink MOST form) Yellow form placed in chart (order not valid for inpatient use);Pink MOST form placed in chart (order not valid for inpatient use)     Chief Complaint  Patient presents with  . Acute Visit    Red Eyes    HPI:  Pt is a 86 y.o. female seen today for an acute visit for    Past Medical History:  Diagnosis Date  . Asthma   . Carpal tunnel syndrome 03/07/2009  . Disturbance of skin sensation 02/21/2009  . Diverticulosis of colon  (without mention of hemorrhage) 02/16/2009  . Fracture of upper end of left humerus 11/18/14  . 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 Hypothyroid   . Osteoarthrosis, unspecified whether generalized or localized, unspecified site 02/16/2009  . Other abnormal blood chemistry 06/19/2010   hyperglycemia  . Other and unspecified hyperlipidemia 10/24/2009  . Other atopic dermatitis and related conditions 02/16/2009  . Otosclerosis, unspecified 02/22/2003  . Pain in limb 12/05/2009  . Senile osteoporosis 06/16/2010  . Sensorineural hearing loss, unspecified 02/21/2009  . Syncope and collapse 05/30/2009  . Unspecified nasal polyp 02/21/2009  . Unspecified urinary incontinence 02/21/2009   Past Surgical History:  Procedure Laterality Date  . ABDOMINAL HYSTERECTOMY  1999   Dr. Collier Bullock  . BREAST BIOPSY  07/16/1990   benign left breast needle biopsy  Dr. Margot Chimes  . CARPAL TUNNEL RELEASE  04/07/2009   right Dr. Daylene Katayama  . CATARACT EXTRACTION W/ INTRAOCULAR LENS  IMPLANT, BILATERAL Bilateral   . INTRAMEDULLARY (IM) NAIL INTERTROCHANTERIC Right 09/17/2017   Procedure: INTRAMEDULLARY (IM) NAIL INTERTROCHANTRIC HIP;  Surgeon: Nicholes Stairs, MD;  Location: [place];  Service: Orthopedics;  Laterality: Right;  . OPEN REDUCTION INTERNAL FIXATION (ORIF) DISTAL RADIAL FRACTURE Right 09/17/2017   Procedure: OPEN REDUCTION INTERNAL FIXATION (ORIF) DISTAL RADIAL FRACTURE;  Surgeon: Iran Planas, MD;  Location: [place];  Service: Orthopedics;  Laterality: Right;  . TRIGGER FINGER RELEASE Left 08/09/2014   Procedure: RELEASE TRIGGER FINGER/A-1 PULLEY LEFT MIDDLE FINGER;  Surgeon: Daryll Brod, MD;  Location: [place];  Service: Orthopedics;  Laterality: Left;  REGIONAL/FAB    Allergies  Allergen Reactions  . Sulfa Antibiotics Itching, Rash and Other (See Comments)    "Allergic," per MAR  . Amoxicillin Other (See Comments)    Per [facility] 09/15/17 Has patient had a PCN reaction causing immediate rash,  facial/tongue/throat swelling, SOB or lightheadedness with hypotension: Unknown Has patient had a PCN reaction causing severe rash involving mucus membranes or skin necrosis: Unknown Has patient had a PCN reaction that required hospitalization: Unknown Has patient had a PCN reaction occurring within the last 10 years: Unknown If all of the above answers are "NO", then may proceed with Cephalosporin use.  . Avelox [Moxifloxacin Hcl In Nacl] Itching  . Doxycycline Other (See Comments)    "Allergic," per MAR  . Hista-Tabs [Triprolidine-Pse] Other (See Comments)    "Passed out"  . Pyrilamine-Phenylephrine Other (See Comments)    "Allergic," per [facility] 09/15/17  . Septra [Sulfamethoxazole-Trimethoprim] Itching, Rash and Other (See Comments)    "Allergic," per MAR    Allergies as of 02/17/2019      Reactions   Sulfa Antibiotics Itching, Rash, Other (See Comments)   "Allergic," per MAR   Amoxicillin Other (See Comments)   Per [facility] 09/15/17 Has patient had a PCN reaction causing immediate rash, facial/tongue/throat swelling, SOB or lightheadedness with hypotension: Unknown Has patient had a PCN reaction causing severe rash involving mucus membranes or skin necrosis: Unknown Has patient had a PCN reaction that required hospitalization: Unknown Has patient had a PCN reaction occurring within the last 10 years: Unknown If all of the above answers are "NO", then may proceed with Cephalosporin use.   Avelox [moxifloxacin Hcl In Nacl] Itching   Doxycycline Other (See Comments)   "Allergic," per MAR   Hista-tabs [triprolidine-pse] Other (See Comments)   "Passed out"   Pyrilamine-phenylephrine Other (See Comments)   "Allergic," per [facility] 09/15/17   Septra [sulfamethoxazole-trimethoprim] Itching, Rash, Other (See Comments)   "Allergic," per [facility]      Medication List       Accurate as of February 17, 2019  3:22 PM. If you have any questions, ask your nurse or doctor.        acetaminophen 325 MG  tablet Commonly known as: TYLENOL Take 650 mg by mouth every 6 (six) hours as needed (for pain).   aspirin EC 81 MG tablet Take 81 mg by mouth See admin instructions. Take 81 mg by mouth in the morning on Tuesday and Saturday   escitalopram 20 MG tablet Commonly known as: LEXAPRO Take 20 mg by mouth daily.   famotidine 20 MG tablet Commonly known as: PEPCID Take 20 mg by mouth daily.   Fluticasone-Salmeterol 250-50 MCG/DOSE Aepb Commonly known as: ADVAIR Inhale 1 puff 2 (two) times daily into the lungs. Rinse mouth after use   furosemide 20 MG tablet Commonly known as: LASIX Take 20 mg by mouth See admin instructions. Take 20 mg by mouth in the morning and hold for SBP < 110   ipratropium 0.02 % nebulizer solution Commonly known as: ATROVENT Take 0.5 mg by nebulization every 6 (six) hours as needed for wheezing or shortness of breath.   lactose free nutrition Liqd Take 237 mLs by mouth See admin instructions. Add 237 ml's to ice cream and consume two times a day   levothyroxine 25 MCG tablet Commonly known as: SYNTHROID Take 25 mcg by mouth daily before breakfast.   mirtazapine 15 MG tablet Commonly known as: REMERON Take 15 mg by mouth daily.   OXYGEN Inhale 2 L/min into the lungs as  needed (to keep O2 SATS >90%).   polyethylene glycol 17 g packet Commonly known as: MIRALAX / GLYCOLAX Take 17 g by mouth daily. Mix with 6-8 ounces of fluid of choice   raloxifene 60 MG tablet Commonly known as: EVISTA Take 1 tablet by mouth  daily to treat osteoporosis What changed: See the new instructions.   Ventolin HFA 108 (90 Base) MCG/ACT inhaler Generic drug: albuterol Inhale 2 puffs into the lungs every 6 (six) hours as needed for shortness of breath (dyspnea).   zinc oxide 20 % ointment Apply to buttocks as needed every shift after incontinent episodes       Review of Systems  Immunization History  Administered Date(s) Administered  . Influenza Whole 12/27/2011,  11/05/2012  . Influenza, High Dose Seasonal PF 11/19/2018  . Influenza-Unspecified 11/18/2013, 11/03/2014, 11/16/2015, 11/13/2016, 11/17/2017  . PPD Test 04/04/2008, 11/23/2014  . Pneumococcal Conjugate-13 09/30/2016  . Pneumococcal Polysaccharide-23 02/05/2008, 06/03/2008  . Td 02/05/2008, 06/03/2008   Pertinent  Health Maintenance Due  Topic Date Due  . INFLUENZA VACCINE  Completed  . DEXA SCAN  Completed  . PNA vac Low Risk Adult  Completed   Fall Risk  10/09/2017 09/23/2016 08/27/2016 05/30/2015 05/16/2015  Falls in the past year? Yes Yes Yes Yes No  Comment - - Emmi Telephone Survey: data to providers prior to load - -  Number falls in past yr: 2 or more 1 1 1  -  Comment - - Emmi Telephone Survey Actual Response = 1 - -  Injury with Fall? Yes No No Yes -  Comment broke R wrist - - - -  Risk Factor Category  - - - - -  Risk for fall due to : - - - History of fall(s);Impaired balance/gait;Impaired mobility -  Follow up - - - Falls evaluation completed;Education provided -   Functional Status Survey:    There were no vitals filed for this visit. There is no height or weight on file to calculate BMI. Physical Exam  Labs reviewed: Recent Labs    01/07/19 0135 01/07/19 0605 01/08/19 0357 01/09/19 0335  NA 141  --  138 143  K 4.0  --  4.8 4.1  CL 105  --  105 109  CO2 23  --  24 27  GLUCOSE 180*  --  169* 108*  BUN 13  --  26* 25*  CREATININE 0.99 1.17* 1.41* 1.05*  CALCIUM 9.5  --  9.0 9.0   Recent Labs    03/19/18 0000 06/17/18 0000  AST 14 14  ALT 11 11  ALKPHOS  --  45   Recent Labs    01/07/19 0135 01/07/19 0605 01/07/19 0737 01/08/19 0357  WBC 12.9* 7.9 6.9 11.2*  NEUTROABS 7.5  --   --   --   HGB 14.0 13.5 13.2 12.3  HCT 45.7 42.3 41.2 38.7  MCV 103.2* 94.2 93.4 94.4  PLT 213 259 258 235   Lab Results  Component Value Date   TSH 1.62 05/11/2018   Lab Results  Component Value Date   HGBA1C 6.0 06/17/2018   Lab Results  Component Value Date     CHOL 211 (A) 02/14/2014   HDL 47 02/14/2014   LDLCALC 133 02/14/2014   TRIG 155 02/14/2014    Significant Diagnostic Results in last 30 days:  No results found.  Assessment/Plan There are no diagnoses linked to this encounter.   Family/ staff Communication:   Labs/tests ordered:

## 2019-02-17 NOTE — Progress Notes (Signed)
Location: [employer] features editor of Service:  SNF (31)  Provider:   Code Status:  Goals of Care:  Advanced Directives 02/10/2019  Does Patient Have a Medical Advance Directive? Yes  Type of Advance Directive Out of facility DNR (pink MOST or yellow form);Living will;Healthcare Power of Attorney  Does patient want to make changes to medical advance directive? No - Patient declined  Copy of [place] in Chart? Yes - validated most recent copy scanned in chart (See row information)  Pre-existing out of facility DNR order (yellow form or pink MOST form) Yellow form placed in chart (order not valid for inpatient use);Pink MOST form placed in chart (order not valid for inpatient use)     Chief Complaint  Patient presents with  . Acute Visit    HPI: Patient is a 86 y.o. female seen today for an acute visit for Discharge and Itching from Both Eyes Patient is a long-term care resident Patient has h/o Sinus Bradycardia, LE edema, Hypothyroidism, Depression, Chronic Sinusitis, H/o Hip Fracture,And Proximal Humerus Fracture with Delayed healing  Nurses wanted me to see her as she has been c/o itching in her Eyes with Discharge and some redness. No Pain or Vision Complains  Past Medical History:  Diagnosis Date  . Asthma   . Carpal tunnel syndrome 03/07/2009  . Disturbance of skin sensation 02/21/2009  . Diverticulosis of colon (without mention of hemorrhage) 02/16/2009  . Fracture of upper end of left humerus 11/18/14  . 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 Hypothyroid   . Osteoarthrosis, unspecified whether generalized or localized, unspecified site 02/16/2009  . Other abnormal blood chemistry 06/19/2010   hyperglycemia  . Other and unspecified hyperlipidemia 10/24/2009  . Other atopic dermatitis and related conditions 02/16/2009  . Otosclerosis, unspecified 02/22/2003  . Pain in limb 12/05/2009  . Senile osteoporosis 06/16/2010  . Sensorineural hearing loss,  unspecified 02/21/2009  . Syncope and collapse 05/30/2009  . Unspecified nasal polyp 02/21/2009  . Unspecified urinary incontinence 02/21/2009    Past Surgical History:  Procedure Laterality Date  . ABDOMINAL HYSTERECTOMY  1999   Dr. Collier Bullock  . BREAST BIOPSY  07/16/1990   benign left breast needle biopsy  Dr. Margot Chimes  . CARPAL TUNNEL RELEASE  04/07/2009   right Dr. Daylene Katayama  . CATARACT EXTRACTION W/ INTRAOCULAR LENS  IMPLANT, BILATERAL Bilateral   . INTRAMEDULLARY (IM) NAIL INTERTROCHANTERIC Right 09/17/2017   Procedure: INTRAMEDULLARY (IM) NAIL INTERTROCHANTRIC HIP;  Surgeon: Nicholes Stairs, MD;  Location: [place];  Service: Orthopedics;  Laterality: Right;  . OPEN REDUCTION INTERNAL FIXATION (ORIF) DISTAL RADIAL FRACTURE Right 09/17/2017   Procedure: OPEN REDUCTION INTERNAL FIXATION (ORIF) DISTAL RADIAL FRACTURE;  Surgeon: Iran Planas, MD;  Location: [place];  Service: Orthopedics;  Laterality: Right;  . TRIGGER FINGER RELEASE Left 08/09/2014   Procedure: RELEASE TRIGGER FINGER/A-1 PULLEY LEFT MIDDLE FINGER;  Surgeon: Daryll Brod, MD;  Location: [place];  Service: Orthopedics;  Laterality: Left;  REGIONAL/FAB    Allergies  Allergen Reactions  . Sulfa Antibiotics Itching, Rash and Other (See Comments)    "Allergic," per MAR  . Amoxicillin Other (See Comments)    Per [facility] 09/15/17 Has patient had a PCN reaction causing immediate rash, facial/tongue/throat swelling, SOB or lightheadedness with hypotension: Unknown Has patient had a PCN reaction causing severe rash involving mucus membranes or skin necrosis: Unknown Has patient had a PCN reaction that required hospitalization: Unknown Has patient had a PCN reaction occurring within  the last 10 years: Unknown If all of the above answers are "NO", then may proceed with Cephalosporin use.  . Avelox [Moxifloxacin Hcl In Nacl] Itching  . Doxycycline Other (See Comments)    "Allergic," per MAR  . Hista-Tabs [Triprolidine-Pse] Other  (See Comments)    "Passed out"  . Pyrilamine-Phenylephrine Other (See Comments)    "Allergic," per [facility] 09/15/17  . Septra [Sulfamethoxazole-Trimethoprim] Itching, Rash and Other (See Comments)    "Allergic," per [facility]    Outpatient Encounter Medications as of 02/17/2019  Medication Sig  . acetaminophen (TYLENOL) 325 MG tablet Take 650 mg by mouth every 6 (six) hours as needed (for pain).   Marland Kitchen albuterol (VENTOLIN HFA) 108 (90 Base) MCG/ACT inhaler Inhale 2 puffs into the lungs every 6 (six) hours as needed for shortness of breath (dyspnea).   Marland Kitchen aspirin EC 81 MG tablet Take 81 mg by mouth See admin instructions. Take 81 mg by mouth in the morning on Tuesday and Saturday  . escitalopram (LEXAPRO) 20 MG tablet Take 20 mg by mouth daily.  . famotidine (PEPCID) 20 MG tablet Take 20 mg by mouth daily.  . Fluticasone-Salmeterol (ADVAIR) 250-50 MCG/DOSE AEPB Inhale 1 puff 2 (two) times daily into the lungs. Rinse mouth after use  . furosemide (LASIX) 20 MG tablet Take 20 mg by mouth See admin instructions. Take 20 mg by mouth in the morning and hold for SBP < 110  . ipratropium (ATROVENT) 0.02 % nebulizer solution Take 0.5 mg by nebulization every 6 (six) hours as needed for wheezing or shortness of breath.  . lactose free nutrition (BOOST PLUS) LIQD Take 237 mLs by mouth See admin instructions. Add 237 ml's to ice cream and consume two times a day  . levothyroxine (SYNTHROID, LEVOTHROID) 25 MCG tablet Take 25 mcg by mouth daily before breakfast.  . mirtazapine (REMERON) 15 MG tablet Take 15 mg by mouth daily.   . OXYGEN Inhale 2 L/min into the lungs as needed (to keep O2 SATS >90%).   . polyethylene glycol (MIRALAX / GLYCOLAX) packet Take 17 g by mouth daily. Mix with 6-8 ounces of fluid of choice  . raloxifene (EVISTA) 60 MG tablet Take 1 tablet by mouth  daily to treat osteoporosis (Patient taking differently: Take 60 mg by mouth daily. )  . zinc oxide 20 % ointment Apply to buttocks as needed every  shift after incontinent episodes   No facility-administered encounter medications on file as of 02/17/2019.    Review of Systems:  Review of Systems  Constitutional: Negative.   HENT: Negative for congestion.   Eyes: Positive for discharge, redness and itching. Negative for photophobia, pain and visual disturbance.  Respiratory: Negative.   Genitourinary: Negative.     Health Maintenance  Topic Date Due  . TETANUS/TDAP  06/04/2018  . INFLUENZA VACCINE  Completed  . DEXA SCAN  Completed  . PNA vac Low Risk Adult  Completed    Physical Exam: Vitals:   02/17/19 1317  BP: 114/68  Pulse: 68  Resp: 18  Temp: (!) 97.4 F (36.3 C)   There is no height or weight on file to calculate BMI. Physical Exam Vitals reviewed.  Constitutional:      Appearance: Normal appearance.  HENT:     Head: Normocephalic.     Nose: Nose normal.     Mouth/Throat:     Mouth: Mucous membranes are moist.     Pharynx: Oropharynx is clear.  Eyes:     Pupils: Pupils are  equal, round, and reactive to light.     Comments: Mild redness in Conjunctiva Vision was good at her [place]  Cardiovascular:     Rate and Rhythm: Normal rate.  Pulmonary:     Effort: Pulmonary effort is normal.     Breath sounds: Normal breath sounds.  Abdominal:     General: Abdomen is flat. Bowel sounds are normal.     Palpations: Abdomen is soft.  Musculoskeletal:        General: No swelling.     Cervical back: Neck supple.  Skin:    General: Skin is dry.  Neurological:     General: No focal deficit present.     Mental Status: She is alert.  Psychiatric:        Mood and Affect: Mood normal.        Thought Content: Thought content normal.     Labs reviewed: Basic Metabolic Panel: Recent Labs    05/11/18 0000 06/17/18 0000 01/07/19 0135 01/07/19 0605 01/08/19 0357 01/09/19 0335  NA  --   --  141  --  138 143  K  --   --  4.0  --  4.8 4.1  CL  --   --  105  --  105 109  CO2  --   --  23  --  24 27    GLUCOSE  --   --  180*  --  169* 108*  BUN  --   --  13  --  26* 25*  CREATININE  --    < > 0.99 1.17* 1.41* 1.05*  CALCIUM  --   --  9.5  --  9.0 9.0  TSH 1.62  --   --   --   --   --    < > = values in this interval not displayed.   Liver Function Tests: Recent Labs    03/19/18 0000 06/17/18 0000  AST 14 14  ALT 11 11  ALKPHOS  --  45   No results for input(s): LIPASE, AMYLASE in the last 8760 hours. No results for input(s): AMMONIA in the last 8760 hours. CBC: Recent Labs    01/07/19 0135 01/07/19 0605 01/07/19 0737 01/08/19 0357  WBC 12.9* 7.9 6.9 11.2*  NEUTROABS 7.5  --   --   --   HGB 14.0 13.5 13.2 12.3  HCT 45.7 42.3 41.2 38.7  MCV 103.2* 94.2 93.4 94.4  PLT 213 259 258 235   Lipid Panel: No results for input(s): CHOL, HDL, LDLCALC, TRIG, CHOLHDL, LDLDIRECT in the last 8760 hours. Lab Results  Component Value Date   HGBA1C 6.0 06/17/2018    Procedures since last visit: No results found.  Assessment/Plan Bilateral Conjunctivitis Will treat her with Ocuflox QID for 7 Days   Labs/tests ordered:  * No order type specified * Next appt:  Visit date not found

## 2019-02-26 ENCOUNTER — Encounter: Payer: Self-pay | Admitting: Nurse Practitioner

## 2019-02-26 ENCOUNTER — Non-Acute Institutional Stay (SKILLED_NURSING_FACILITY): Payer: Medicare Other | Admitting: Nurse Practitioner

## 2019-02-26 DIAGNOSIS — F339 Major depressive disorder, recurrent, unspecified: Secondary | ICD-10-CM | POA: Diagnosis not present

## 2019-02-26 DIAGNOSIS — E507 Other ocular manifestations of vitamin A deficiency: Secondary | ICD-10-CM

## 2019-02-26 DIAGNOSIS — J42 Unspecified chronic bronchitis: Secondary | ICD-10-CM

## 2019-02-26 NOTE — Assessment & Plan Note (Signed)
Her mood is stable, continue Mirtazapine, Escitalopram.

## 2019-02-26 NOTE — Assessment & Plan Note (Signed)
Appears dry eyes, mild irritation, no drainage or injected conjunctivae, will apply artificial tears I gtt OU tid.

## 2019-02-26 NOTE — Progress Notes (Signed)
Location:   [place] Room Number: 25 Place of Service:  SNF (31) Provider:  Daniell Mancinas NP  Sanaiyah Kirchhoff X, NP  Patient Care Team: Mario Voong X, NP as PCP - General (Internal Medicine) Newton Pigg, MD as Consulting Physician (Obstetrics and Gynecology) Monna Fam, MD as Consulting Physician (Ophthalmology) Melida Quitter, MD as Consulting Physician (Otolaryngology) Myrlene Broker, MD as Consulting Physician (Urology) Garner, [facility] Virgie Dad, MD as Consulting Physician (Internal Medicine)  Extended Emergency Contact Information Primary Emergency Contact: Gettel,[name] Address: [place], [place] Phone: [phone] Work Phone: [phone] Mobile Phone: [phone] Relation: Son Secondary Emergency Contact: Te[place] Phone: [phone] Work Phone: [phone] Relation: Friend  Code Status:  DNR Goals of care: Advanced Directive information Advanced Directives 02/17/2019  Does Patient Have a Medical Advance Directive? Yes  Type of Paramedic of [place];Living will;Out of facility DNR (pink MOST or yellow form)  Does patient want to make changes to medical advance directive? No - Patient declined  Copy of [place] in Chart? Yes - validated most recent copy scanned in chart (See row information)  Pre-existing out of facility DNR order (yellow form or pink MOST form) Pink MOST/Yellow Form most recent copy in chart - Physician notified to receive inpatient order     Chief Complaint  Patient presents with  . Acute Visit    Red eye    HPI:  Pt is a 86 y.o. female seen today for an acute visit for for s/p treated conjunctivitis with Ocuflox. Staff reported the patient's eyes are still red, itching, requested further treatment. The patient stated she wishes she can see better, admitted some irritation,  but no itching, no noted drainage or redness in conjunctivae. The patient resides in [facility] for safety, care assistance, her mood is stable, on Mirtazapine 15mg  qd, Escitalopram 20mg  qd. COPD, stable, on prn Albuterol HFA, Advair bid, O2.    Past Medical History:  Diagnosis Date  . Asthma   . Carpal tunnel syndrome 03/07/2009  . Disturbance of skin sensation 02/21/2009  . Diverticulosis of colon (without mention of hemorrhage) 02/16/2009  . Fracture of upper end of left humerus 11/18/14  . 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 Hypothyroid   . Osteoarthrosis, unspecified whether generalized or localized, unspecified site 02/16/2009  . Other abnormal blood chemistry 06/19/2010   hyperglycemia  . Other and unspecified hyperlipidemia 10/24/2009  . Other atopic dermatitis and related conditions 02/16/2009  . Otosclerosis, unspecified 02/22/2003  . Pain in limb 12/05/2009  . Senile osteoporosis 06/16/2010  . Sensorineural hearing loss, unspecified 02/21/2009  . Syncope and collapse 05/30/2009  . Unspecified nasal polyp 02/21/2009  . Unspecified urinary incontinence 02/21/2009   Past Surgical History:  Procedure Laterality Date  . ABDOMINAL HYSTERECTOMY  1999   Dr. Collier Bullock  . BREAST BIOPSY  07/16/1990   benign left breast needle biopsy  Dr. Margot Chimes  . CARPAL TUNNEL RELEASE  04/07/2009   right Dr. Daylene Katayama  . CATARACT EXTRACTION W/ INTRAOCULAR LENS  IMPLANT, BILATERAL Bilateral   . INTRAMEDULLARY (IM) NAIL INTERTROCHANTERIC Right 09/17/2017   Procedure: INTRAMEDULLARY (IM) NAIL INTERTROCHANTRIC HIP;  Surgeon: Nicholes Stairs, MD;  Location: [place];  Service: Orthopedics;  Laterality: Right;  . OPEN REDUCTION INTERNAL FIXATION (ORIF) DISTAL RADIAL FRACTURE Right 09/17/2017   Procedure: OPEN REDUCTION INTERNAL  FIXATION (ORIF) DISTAL RADIAL FRACTURE;  Surgeon: Iran Planas, MD;  Location: [place];  Service: Orthopedics;  Laterality: Right;  . TRIGGER FINGER RELEASE Left 08/09/2014   Procedure: RELEASE  TRIGGER FINGER/A-1 PULLEY LEFT MIDDLE FINGER;  Surgeon: Daryll Brod, MD;  Location: [place];  Service: Orthopedics;  Laterality: Left;  REGIONAL/FAB    Allergies  Allergen Reactions  . Sulfa Antibiotics Itching, Rash and Other (See Comments)    "Allergic," per MAR  . Amoxicillin Other (See Comments)    Per [facility] 09/15/17 Has patient had a PCN reaction causing immediate rash, facial/tongue/throat swelling, SOB or lightheadedness with hypotension: Unknown Has patient had a PCN reaction causing severe rash involving mucus membranes or skin necrosis: Unknown Has patient had a PCN reaction that required hospitalization: Unknown Has patient had a PCN reaction occurring within the last 10 years: Unknown If all of the above answers are "NO", then may proceed with Cephalosporin use.  . Avelox [Moxifloxacin Hcl In Nacl] Itching  . Doxycycline Other (See Comments)    "Allergic," per MAR  . Hista-Tabs [Triprolidine-Pse] Other (See Comments)    "Passed out"  . Pyrilamine-Phenylephrine Other (See Comments)    "Allergic," per [facility] 09/15/17  . Septra [Sulfamethoxazole-Trimethoprim] Itching, Rash and Other (See Comments)    "Allergic," per MAR    Allergies as of 02/26/2019      Reactions   Sulfa Antibiotics Itching, Rash, Other (See Comments)   "Allergic," per MAR   Amoxicillin Other (See Comments)   Per [facility] 09/15/17 Has patient had a PCN reaction causing immediate rash, facial/tongue/throat swelling, SOB or lightheadedness with hypotension: Unknown Has patient had a PCN reaction causing severe rash involving mucus membranes or skin necrosis: Unknown Has patient had a PCN reaction that required hospitalization: Unknown Has patient had a PCN reaction occurring within the last 10 years: Unknown If all of the above answers are "NO", then may proceed with Cephalosporin use.   Avelox [moxifloxacin Hcl In Nacl] Itching   Doxycycline Other (See Comments)   "Allergic," per MAR   Hista-tabs  [triprolidine-pse] Other (See Comments)   "Passed out"   Pyrilamine-phenylephrine Other (See Comments)   "Allergic," per [facility] 09/15/17   Septra [sulfamethoxazole-trimethoprim] Itching, Rash, Other (See Comments)   "Allergic," per [facility]      Medication List       Accurate as of February 26, 2019  3:31 PM. If you have any questions, ask your nurse or doctor.        STOP taking these medications   ipratropium 0.02 % nebulizer solution Commonly known as: ATROVENT Stopped by: Gedalya Jim X Marcha Licklider, NP     TAKE these medications   acetaminophen 325 MG tablet Commonly known as: TYLENOL Take 650 mg by mouth every 6 (six) hours as needed (for pain).   aspirin EC 81 MG tablet Take 81 mg by mouth See admin instructions. Take 81 mg by mouth in the morning on Tuesday and Saturday. 7am-11am   escitalopram 20 MG tablet Commonly known as: LEXAPRO Take 20 mg by mouth daily. In the morning 7am-11am.   famotidine 20 MG tablet Commonly known as: PEPCID Take 20 mg by mouth daily. In the morning 7am-11am.   Fluticasone-Salmeterol 250-50 MCG/DOSE Aepb Commonly known as: ADVAIR Inhale 1 puff into the lungs 2 (two) times daily. Morning: 7am-11am Evening: 4pm-7pm.  Rinse mouth after use   furosemide 20 MG tablet Commonly known as: LASIX Take 20 mg by mouth See admin instructions. Take 20 mg by mouth in the  morning between 7am-11am and hold for SBP < 110   lactose free nutrition Liqd Take 237 mLs by mouth See admin instructions. Add 237 ml's to ice cream and consume two times a day. 10am & 4pm.   levothyroxine 25 MCG tablet Commonly known as: SYNTHROID Take 25 mcg by mouth daily before breakfast. 7:30am.   mirtazapine 15 MG tablet Commonly known as: REMERON Take 15 mg by mouth daily. 7:30pm-11pm.   OXYGEN Inhale 2 L/min into the lungs as needed (to keep O2 SATS >90%).   polyethylene glycol 17 g packet Commonly known as: MIRALAX / GLYCOLAX Take 17 g by mouth daily. Mix with 6-8 ounces of fluid of  choice. Once daily 7am-11:00am.   raloxifene 60 MG tablet Commonly known as: EVISTA Take 60 mg by mouth 2 (two) times daily. 4pm-7pm.   Ventolin HFA 108 (90 Base) MCG/ACT inhaler Generic drug: albuterol Inhale 2 puffs into the lungs every 6 (six) hours as needed for shortness of breath (dyspnea).   zinc oxide 20 % ointment Apply to buttocks as needed every shift after incontinent episodes      ROS was provided with assistance of staff.  Review of Systems  Constitutional: Negative for activity change, appetite change, chills, diaphoresis, fatigue and fever.  HENT: Positive for hearing loss. Negative for congestion and voice change.   Eyes: Negative for pain, discharge, redness, itching and visual disturbance.       Does admit some irritation to her eyes.   Respiratory: Positive for shortness of breath. Negative for cough and wheezing.        DOE  Cardiovascular: Negative for chest pain, palpitations and leg swelling.  Gastrointestinal: Negative for abdominal distention, abdominal pain, constipation, diarrhea, nausea and vomiting.  Genitourinary: Negative for difficulty urinating, dysuria and urgency.  Musculoskeletal: Positive for arthralgias and gait problem.  Neurological: Negative for dizziness, speech difficulty, weakness and headaches.       Memory lapses.   Psychiatric/Behavioral: Negative for agitation, hallucinations and sleep disturbance. The patient is not nervous/anxious.     Immunization History  Administered Date(s) Administered  . Influenza Whole 12/27/2011, 11/05/2012  . Influenza, High Dose Seasonal PF 11/19/2018  . Influenza-Unspecified 11/18/2013, 11/03/2014, 11/16/2015, 11/13/2016, 11/17/2017  . PFIZER SARS-COV-2 Vaccination 02/08/2019  . PPD Test 04/04/2008, 11/23/2014  . Pneumococcal Conjugate-13 09/30/2016  . Pneumococcal Polysaccharide-23 02/05/2008, 06/03/2008  . Td 02/05/2008, 06/03/2008   Pertinent  Health Maintenance Due  Topic Date Due  .  INFLUENZA VACCINE  Completed  . DEXA SCAN  Completed  . PNA vac Low Risk Adult  Completed   Fall Risk  10/09/2017 09/23/2016 08/27/2016 05/30/2015 05/16/2015  Falls in the past year? Yes Yes Yes Yes No  Comment - - Emmi Telephone Survey: data to providers prior to load - -  Number falls in past yr: 2 or more 1 1 1  -  Comment - - Emmi Telephone Survey Actual Response = 1 - -  Injury with Fall? Yes No No Yes -  Comment broke R wrist - - - -  Risk Factor Category  - - - - -  Risk for fall due to : - - - History of fall(s);Impaired balance/gait;Impaired mobility -  Follow up - - - Falls evaluation completed;Education provided -   Functional Status Survey:    Vitals:   02/26/19 1456  BP: 126/74  Pulse: (!) 50  Resp: 20  Temp: 97.8 F (36.6 C)  SpO2: 94%  Weight: 141 lb 4.8 oz (64.1 kg)  Height: 4'  8" (1.422 m)   Body mass index is 31.68 kg/m. Physical Exam Vitals and nursing note reviewed.  Constitutional:      General: She is not in acute distress.    Appearance: Normal appearance. She is not ill-appearing, toxic-appearing or diaphoretic.  HENT:     Head: Normocephalic and atraumatic.     Nose: Nose normal.     Mouth/Throat:     Mouth: Mucous membranes are moist.  Eyes:     General:        Right eye: No discharge.        Left eye: No discharge.     Extraocular Movements: Extraocular movements intact.     Conjunctiva/sclera: Conjunctivae normal.     Pupils: Pupils are equal, round, and reactive to light.     Comments: Appears eye  Cardiovascular:     Rate and Rhythm: Normal rate and regular rhythm.     Heart sounds: No murmur.  Pulmonary:     Breath sounds: No wheezing, rhonchi or rales.  Abdominal:     General: Bowel sounds are normal. There is no distension.     Palpations: Abdomen is soft.     Tenderness: There is no abdominal tenderness. There is no right CVA tenderness, left CVA tenderness, guarding or rebound.  Musculoskeletal:     Cervical back: Normal range of  motion and neck supple.     Right lower leg: No edema.     Left lower leg: No edema.  Skin:    General: Skin is warm and dry.  Neurological:     General: No focal deficit present.     Mental Status: She is alert. Mental status is at baseline.     Motor: No weakness.     Coordination: Coordination normal.     Gait: Gait abnormal.     Comments: Oriented to person, place.   Psychiatric:        Mood and Affect: Mood normal.        Behavior: Behavior normal.        Thought Content: Thought content normal.     Labs reviewed: Recent Labs    01/07/19 0135 01/07/19 0135 01/07/19 0605 01/08/19 0357 01/09/19 0335  NA 141  --   --  138 143  K 4.0  --   --  4.8 4.1  CL 105  --   --  105 109  CO2 23  --   --  24 27  GLUCOSE 180*  --   --  169* 108*  BUN 13  --   --  26* 25*  CREATININE 0.99   < > 1.17* 1.41* 1.05*  CALCIUM 9.5  --   --  9.0 9.0   < > = values in this interval not displayed.   Recent Labs    03/19/18 0000 06/17/18 0000  AST 14 14  ALT 11 11  ALKPHOS  --  45   Recent Labs    01/07/19 0135 01/07/19 0135 01/07/19 0605 01/07/19 0737 01/08/19 0357  WBC 12.9*   < > 7.9 6.9 11.2*  NEUTROABS 7.5  --   --   --   --   HGB 14.0   < > 13.5 13.2 12.3  HCT 45.7   < > 42.3 41.2 38.7  MCV 103.2*   < > 94.2 93.4 94.4  PLT 213   < > 259 258 235   < > = values in this interval not displayed.   Lab Results  Component Value Date   TSH 1.62 05/11/2018   Lab Results  Component Value Date   HGBA1C 6.0 06/17/2018   Lab Results  Component Value Date   CHOL 211 (A) 02/14/2014   HDL 47 02/14/2014   LDLCALC 133 02/14/2014   TRIG 155 02/14/2014    Significant Diagnostic Results in last 30 days:  No results found.  Assessment/Plan Xerophthalmus Appears dry eyes, mild irritation, no drainage or injected conjunctivae, will apply artificial tears I gtt OU tid.   Depression, recurrent ([place]) Her mood is stable, continue Mirtazapine, Escitalopram.   COPD (chronic  obstructive pulmonary disease) (HCC) Chronic O2 dependent, continue prn Albuterol HFA, continue Advair bid.      Family/ staff Communication: plan of care reviewed with the patient and charge nurse.   Labs/tests ordered:  None  Time spend 25 minutes.

## 2019-02-26 NOTE — Assessment & Plan Note (Signed)
Chronic O2 dependent, continue prn Albuterol HFA, continue Advair bid.

## 2019-03-09 ENCOUNTER — Encounter: Payer: Self-pay | Admitting: Nurse Practitioner

## 2019-03-09 ENCOUNTER — Non-Acute Institutional Stay (SKILLED_NURSING_FACILITY): Payer: Medicare Other | Admitting: Nurse Practitioner

## 2019-03-09 DIAGNOSIS — E039 Hypothyroidism, unspecified: Secondary | ICD-10-CM | POA: Diagnosis not present

## 2019-03-09 DIAGNOSIS — K219 Gastro-esophageal reflux disease without esophagitis: Secondary | ICD-10-CM

## 2019-03-09 DIAGNOSIS — K5900 Constipation, unspecified: Secondary | ICD-10-CM

## 2019-03-09 DIAGNOSIS — F339 Major depressive disorder, recurrent, unspecified: Secondary | ICD-10-CM | POA: Diagnosis not present

## 2019-03-09 DIAGNOSIS — I739 Peripheral vascular disease, unspecified: Secondary | ICD-10-CM

## 2019-03-09 NOTE — Assessment & Plan Note (Signed)
Her mood is stable, continue Mirtazapine, Escitalopram.  

## 2019-03-09 NOTE — Assessment & Plan Note (Signed)
Stable, continue MiraLax.  °

## 2019-03-09 NOTE — Assessment & Plan Note (Signed)
TSH wnl 05/2018, continue Levothyroxine 65mg qd, update TSH, CBC/diff, CMP/eGFR.

## 2019-03-09 NOTE — Progress Notes (Signed)
Location:   [place] Room Number: 25 Place of Service:  SNF (31) Provider: Mast, Man X, NP  Mast, Man X, NP  Patient Care Team: Mast, Man X, NP as PCP - General (Internal Medicine) Newton Pigg, MD as Consulting Physician (Obstetrics and Gynecology) Monna Fam, MD as Consulting Physician (Ophthalmology) Melida Quitter, MD as Consulting Physician (Otolaryngology) Myrlene Broker, MD as Consulting Physician (Urology) Crown, [facility] Virgie Dad, MD as Consulting Physician (Internal Medicine)  Extended Emergency Contact Information Primary Emergency Contact: Fein,[name] Address: [place], [place] Phone: [phone] Work Phone: [phone] Mobile Phone: [phone] Relation: Son Secondary Emergency Contact: Teofilo [place] Phone: [phone] Work Phone: [phone] Relation: Friend  Code Status:  DNR Goals of care: Advanced Directive information Advanced Directives 03/09/2019  Does Patient Have a Medical Advance Directive? Yes  Type of Advance Directive Out of facility DNR (pink MOST or yellow form);Living will;Healthcare Power of Attorney  Does patient want to make changes to medical advance directive? No - Patient declined  Copy of [place] in Chart? Yes - validated most recent copy scanned in chart (See row information)  Pre-existing out of facility DNR order (yellow form or pink MOST form) Yellow form placed in chart (order not valid for inpatient use);Pink MOST form placed in chart (order not valid for inpatient use)     Chief Complaint  Patient presents with  . Medical Management of Chronic Issues  . Health Maintenance    TDAP    HPI:  Pt is a 86 y.o. female seen today for medical management of chronic diseases.    The [facility] in [facility] for [facility], care assistance, her mood is stable, on Escitalopram 56m  qd, Mirtazapine 133mqd. GERD, stable, on Famotidine 2043md. PVD, compensated on Furosemide 20m52m. Hypothyroidism, stable, on Levothyroxine 25mc11m. Constipation, stable, on MiraLax qd.    Past Medical History:  Diagnosis Date  . Asthma   . Carpal tunnel syndrome 03/07/2009  . Disturbance of skin sensation 02/21/2009  . Diverticulosis of colon (without mention of hemorrhage) 02/16/2009  . Fracture of upper end of left humerus 11/18/14  . 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 Hypothyroid   . Osteoarthrosis, unspecified whether generalized or localized, unspecified site 02/16/2009  . Other abnormal blood chemistry 06/19/2010   hyperglycemia  . Other and unspecified hyperlipidemia 10/24/2009  . Other atopic dermatitis and related conditions 02/16/2009  . Otosclerosis, unspecified 02/22/2003  . Pain in limb 12/05/2009  . Senile osteoporosis 06/16/2010  . Sensorineural hearing loss, unspecified 02/21/2009  . Syncope and collapse 05/30/2009  . Unspecified nasal polyp 02/21/2009  . Unspecified urinary incontinence 02/21/2009   Past Surgical History:  Procedure Laterality Date  . ABDOMINAL HYSTERECTOMY  1999   Dr. S. FoCollier BullockREAST BIOPSY  07/16/1990   benign left breast needle biopsy  Dr. StrecMargot ChimesARPAL TUNNEL RELEASE  04/07/2009   right Dr. SypheDaylene KatayamaATARACT EXTRACTION W/ INTRAOCULAR LENS  IMPLANT, BILATERAL Bilateral   . INTRAMEDULLARY (IM) NAIL INTERTROCHANTERIC Right 09/17/2017   Procedure: INTRAMEDULLARY (IM) NAIL INTERTROCHANTRIC HIP;  Surgeon: RogerNicholes Stairs  [facility];  Laterality: Right;  . OPEN REDUCTION INTERNAL FIXATION (ORIF) DISTAL RADIAL FRACTURE Right 09/17/2017   Procedure: OPEN REDUCTION INTERNAL FIXATION (ORIF) DISTAL RADIAL FRACTURE;  Surgeon: OrtmaIran Planas  MD;  Location: [place];  Service: Orthopedics;  Laterality: Right;  . TRIGGER FINGER RELEASE Left 08/09/2014   Procedure: RELEASE TRIGGER FINGER/A-1 PULLEY LEFT MIDDLE FINGER;  Surgeon:  Daryll Brod, MD;  Location: [place];  Service: Orthopedics;  Laterality: Left;  REGIONAL/FAB    Allergies  Allergen Reactions  . Sulfa Antibiotics Itching, Rash and Other (See Comments)    "Allergic," per MAR  . Amoxicillin Other (See Comments)    Per [facility] 09/15/17 Has patient had a PCN reaction causing immediate rash, facial/tongue/throat swelling, SOB or lightheadedness with hypotension: Unknown Has patient had a PCN reaction causing severe rash involving mucus membranes or skin necrosis: Unknown Has patient had a PCN reaction that required hospitalization: Unknown Has patient had a PCN reaction occurring within the last 10 years: Unknown If all of the above answers are "NO", then may proceed with Cephalosporin use.  . Avelox [Moxifloxacin Hcl In Nacl] Itching  . Doxycycline Other (See Comments)    "Allergic," per MAR  . Hista-Tabs [Triprolidine-Pse] Other (See Comments)    "Passed out"  . Pyrilamine-Phenylephrine Other (See Comments)    "Allergic," per [facility] 09/15/17  . Septra [Sulfamethoxazole-Trimethoprim] Itching, Rash and Other (See Comments)    "Allergic," per MAR    Allergies as of 03/09/2019      Reactions   Sulfa Antibiotics Itching, Rash, Other (See Comments)   "Allergic," per MAR   Amoxicillin Other (See Comments)   Per [facility] 09/15/17 Has patient had a PCN reaction causing immediate rash, facial/tongue/throat swelling, SOB or lightheadedness with hypotension: Unknown Has patient had a PCN reaction causing severe rash involving mucus membranes or skin necrosis: Unknown Has patient had a PCN reaction that required hospitalization: Unknown Has patient had a PCN reaction occurring within the last 10 years: Unknown If all of the above answers are "NO", then may proceed with Cephalosporin use.   Avelox [moxifloxacin Hcl In Nacl] Itching   Doxycycline Other (See Comments)   "Allergic," per MAR   Hista-tabs [triprolidine-pse] Other (See Comments)   "Passed out"    Pyrilamine-phenylephrine Other (See Comments)   "Allergic," per [facility] 09/15/17   Septra [sulfamethoxazole-trimethoprim] Itching, Rash, Other (See Comments)   "Allergic," per [facility]      Medication List       Accurate as of March 09, 2019 12:26 PM. If you have any questions, ask your nurse or doctor.        acetaminophen 325 MG tablet Commonly known as: TYLENOL Take 650 mg by mouth every 6 (six) hours as needed (for pain).   aspirin EC 81 MG tablet Take 81 mg by mouth See admin instructions. Take 81 mg by mouth in the morning on Tuesday and Saturday. 7am-11am   escitalopram 20 MG tablet Commonly known as: LEXAPRO Take 20 mg by mouth daily. In the morning 7am-11am.   famotidine 20 MG tablet Commonly known as: PEPCID Take 20 mg by mouth daily. In the morning 7am-11am.   Fluticasone-Salmeterol 250-50 MCG/DOSE Aepb Commonly known as: ADVAIR Inhale 1 puff into the lungs 2 (two) times daily. Morning: 7am-11am Evening: 4pm-7pm.  Rinse mouth after use   furosemide 20 MG tablet Commonly known as: LASIX Take 20 mg by mouth See admin instructions. Take 20 mg by mouth in the morning between 7am-11am and hold for SBP < 110   lactose free nutrition Liqd Take 237 mLs by mouth See admin instructions. Add 237 ml's to ice cream and consume two times a day. 10am & 4pm.  levothyroxine 25 MCG tablet Commonly known as: SYNTHROID Take 25 mcg by mouth daily before breakfast. 7:30am.   mirtazapine 15 MG tablet Commonly known as: REMERON Take 15 mg by mouth daily. 7:30pm-11pm.   OXYGEN Inhale 2 L/min into the lungs as needed (to keep O2 SATS >90%).   polyethylene glycol 17 g packet Commonly known as: MIRALAX / GLYCOLAX Take 17 g by mouth daily. Mix with 6-8 ounces of fluid of choice. Once daily 7am-11:00am.   polyvinyl alcohol 1.4 % ophthalmic solution Commonly known as: LIQUIFILM TEARS Place 1 drop into both eyes 3 (three) times daily.   raloxifene 60 MG tablet Commonly known as:  EVISTA Take 60 mg by mouth 2 (two) times daily. 4pm-7pm.   Ventolin HFA 108 (90 Base) MCG/ACT inhaler Generic drug: albuterol Inhale 2 puffs into the lungs every 6 (six) hours as needed for shortness of breath (dyspnea).   zinc oxide 20 % ointment Apply to buttocks as needed every shift after incontinent episodes      ROS was provided with assistance of staff.  Review of Systems  Constitutional: Negative for activity change, appetite change, chills, diaphoresis, fatigue and fever.  HENT: Positive for hearing loss. Negative for congestion and voice change.   Eyes: Negative for visual disturbance.  Respiratory: Positive for shortness of breath. Negative for cough and wheezing.        DOE  Cardiovascular: Negative for chest pain, palpitations and leg swelling.  Gastrointestinal: Negative for abdominal distention, constipation, diarrhea, nausea and vomiting.  Genitourinary: Negative for difficulty urinating, dysuria and urgency.  Musculoskeletal: Positive for arthralgias and gait problem.  Skin: Negative for color change and pallor.  Neurological: Negative for dizziness, speech difficulty, weakness and headaches.       Memory lapses.   Psychiatric/Behavioral: Negative for agitation, behavioral problems, hallucinations and sleep disturbance. The patient is not nervous/anxious.     Immunization History  Administered Date(s) Administered  . Influenza Whole 12/27/2011, 11/05/2012  . Influenza, High Dose Seasonal PF 11/19/2018  . Influenza-Unspecified 11/18/2013, 11/03/2014, 11/16/2015, 11/13/2016, 11/17/2017  . PFIZER SARS-COV-2 Vaccination 02/08/2019  . PPD Test 04/04/2008, 11/23/2014  . Pneumococcal Conjugate-13 09/30/2016  . Pneumococcal Polysaccharide-23 02/05/2008, 06/03/2008  . Td 02/05/2008, 06/03/2008   Pertinent  Health Maintenance Due  Topic Date Due  . INFLUENZA VACCINE  Completed  . DEXA SCAN  Completed  . PNA vac Low Risk Adult  Completed   Fall Risk  10/09/2017  09/23/2016 08/27/2016 05/30/2015 05/16/2015  Falls in the past year? Yes Yes Yes Yes No  Comment - - Emmi Telephone Survey: data to providers prior to load - -  Number falls in past yr: 2 or more _0 -  Comment - - Emmi Telephone Survey Actual Response = 1 - -  Injury with Fall? Yes No No Yes -  Comment broke R wrist - - - -  Risk Factor Category  - - - - -  Risk for fall due to : - - - History of fall(s);Impaired balance/gait;Impaired mobility -  Follow up - - - Falls evaluation completed;Education provided -   Functional Status Survey:    Vitals:   03/09/19 0936  BP: 122/68  Pulse: 68  Resp: 20  Temp: 97.6 F (36.4 C)  SpO2: 96%  Weight: 141 lb 3.2 oz (64 kg)  Height: 4' 8" (1.422 m)   Body mass index is 31.66 kg/m. Physical Exam Vitals and nursing note reviewed.  Constitutional:      General: She is not  in acute distress.    Appearance: Normal appearance. She is obese. She is not ill-appearing, toxic-appearing or diaphoretic.  HENT:     Head: Normocephalic and atraumatic.     Nose: Nose normal.     Mouth/Throat:     Mouth: Mucous membranes are moist.  Eyes:     Extraocular Movements: Extraocular movements intact.     Conjunctiva/sclera: Conjunctivae normal.     Pupils: Pupils are equal, round, and reactive to light.  Cardiovascular:     Rate and Rhythm: Normal rate and regular rhythm.     Heart sounds: No murmur.  Pulmonary:     Breath sounds: No wheezing, rhonchi or rales.  Abdominal:     General: Bowel sounds are normal. There is no distension.     Palpations: Abdomen is soft.     Tenderness: There is no abdominal tenderness. There is no right CVA tenderness, left CVA tenderness, guarding or rebound.  Musculoskeletal:     Cervical back: Normal range of motion and neck supple.     Right lower leg: No edema.     Left lower leg: No edema.     Comments: R shoulder limited over head ROM  Skin:    General: Skin is warm and dry.  Neurological:     General: No  focal deficit present.     Mental Status: She is alert. Mental status is at baseline.     Motor: No weakness.     Coordination: Coordination normal.     Gait: Gait abnormal.     Comments: Oriented to person  Psychiatric:        Mood and Affect: Mood normal.        Behavior: Behavior normal.        Thought Content: Thought content normal.     Labs reviewed: Recent Labs    01/07/19 0135 01/07/19 0135 01/07/19 0605 01/08/19 0357 01/09/19 0335  NA 141  --   --  138 143  K 4.0  --   --  4.8 4.1  CL 105  --   --  105 109  CO2 23  --   --  24 27  GLUCOSE 180*  --   --  169* 108*  BUN 13  --   --  26* 25*  CREATININE 0.99   < > 1.17* 1.41* 1.05*  CALCIUM 9.5  --   --  9.0 9.0   < > = values in this interval not displayed.   Recent Labs    03/19/18 0000 06/17/18 0000  AST 14 14  ALT 11 11  ALKPHOS  --  45   Recent Labs    01/07/19 0135 01/07/19 0135 01/07/19 0605 01/07/19 0737 01/08/19 0357  WBC 12.9*   < > 7.9 6.9 11.2*  NEUTROABS 7.5  --   --   --   --   HGB 14.0   < > 13.5 13.2 12.3  HCT 45.7   < > 42.3 41.2 38.7  MCV 103.2*   < > 94.2 93.4 94.4  PLT 213   < > 259 258 235   < > = values in this interval not displayed.   Lab Results  Component Value Date   TSH 1.62 05/11/2018   Lab Results  Component Value Date   HGBA1C 6.0 06/17/2018   Lab Results  Component Value Date   CHOL 211 (A) 02/14/2014   HDL 47 02/14/2014   LDLCALC 133 02/14/2014   TRIG 155 02/14/2014  Significant Diagnostic Results in last 30 days:  No results found.  Assessment/Plan Hypothyroid TSH wnl 05/2018, continue Levothyroxine 69mg qd, update TSH, CBC/diff, CMP/eGFR.   Depression, recurrent ([place] Her mood is stable, continue Mirtazapine, Escitalopram  GERD (gastroesophageal reflux disease) Stable, continue Famotidine.   PVD (peripheral vascular disease) ([place] No apparent edema BLE, continue Furosmide.   Constipation Stable, continue MiraLax.      Family/ staff  Communication: plan of care reviewed with the patient and charge nurse.   Labs/tests ordered:  CBC/diff, CMP/eGFR, TSH  Time spend 25 minutes.

## 2019-03-09 NOTE — Assessment & Plan Note (Signed)
No apparent edema BLE, continue Furosmide.

## 2019-03-09 NOTE — Assessment & Plan Note (Signed)
Stable, continue Famotidine.  

## 2019-03-15 ENCOUNTER — Non-Acute Institutional Stay (SKILLED_NURSING_FACILITY): Payer: Medicare Other | Admitting: Internal Medicine

## 2019-03-15 ENCOUNTER — Encounter: Payer: Self-pay | Admitting: Internal Medicine

## 2019-03-15 DIAGNOSIS — F339 Major depressive disorder, recurrent, unspecified: Secondary | ICD-10-CM | POA: Diagnosis not present

## 2019-03-15 DIAGNOSIS — H1011 Acute atopic conjunctivitis, right eye: Secondary | ICD-10-CM

## 2019-03-15 DIAGNOSIS — J42 Unspecified chronic bronchitis: Secondary | ICD-10-CM

## 2019-03-15 NOTE — Progress Notes (Signed)
Location:    [place] Room Number: 25 Place of Service:  SNF (31) Provider:  Veleta Miners MD  Mast, Man X, NP  Patient Care Team: Mast, Man X, NP as PCP - General (Internal Medicine) Newton Pigg, MD as Consulting Physician (Obstetrics and Gynecology) Monna Fam, MD as Consulting Physician (Ophthalmology) Melida Quitter, MD as Consulting Physician (Otolaryngology) Myrlene Broker, MD as Consulting Physician (Urology) [place], [facility] Virgie Dad, MD as Consulting Physician (Internal Medicine)  Extended Emergency Contact Information Primary Emergency Contact: Swails,[name] Address: [place], [place] Phone: [phone] Work Phone: [phone] Mobile Phone: [phone] Relation: Son Secondary Emergency Contact: Teofilo [place] Phone: [phone] Work Phone: [phone] Relation: Friend  Code Status:  DNR Goals of care: Advanced Directive information Advanced Directives 03/15/2019  Does Patient Have a Medical Advance Directive? Yes  Type of Advance Directive Out of facility DNR (pink MOST or yellow form);Living will;Healthcare Power of Attorney  Does patient want to make changes to medical advance directive? No - Patient declined  Copy of [place] in Chart? Yes - validated most recent copy scanned in chart (See row information)  Pre-existing out of facility DNR order (yellow form or pink MOST form) Yellow form placed in chart (order not valid for inpatient use);Pink MOST form placed in chart (order not valid for inpatient use)     Chief Complaint  Patient presents with  . Acute Visit    Eye redness and coughing    HPI:  Pt is a 86 y.o. female seen today for an acute visit for Redness in her Right Eye with Itching. Also c/o Cough.  Patient is a long-term care resident Patient has h/o Sinus Bradycardia, LE edema,  Hypothyroidism, Depression, Chronic Sinusitis, H/o Hip Fracture,And  Proximal Humerus Fracture due to Fall with Delayed healing Patient has been c/o Redness and itching in her Right EYE. No Discharge No Pain. NO Change in vision Also Nurses noticed her to to be having Dry Cough with no SOB or Fever   Past Medical History:  Diagnosis Date  . Asthma   . Carpal tunnel syndrome 03/07/2009  . Disturbance of skin sensation 02/21/2009  . Diverticulosis of colon (without mention of hemorrhage) 02/16/2009  . Fracture of upper end of left humerus 11/18/14  . 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 Hypothyroid   . Osteoarthrosis, unspecified whether generalized or localized, unspecified site 02/16/2009  . Other abnormal blood chemistry 06/19/2010   hyperglycemia  . Other and unspecified hyperlipidemia 10/24/2009  . Other atopic dermatitis and related conditions 02/16/2009  . Otosclerosis, unspecified 02/22/2003  . Pain in limb 12/05/2009  . Senile osteoporosis 06/16/2010  . Sensorineural hearing loss, unspecified 02/21/2009  . Syncope and collapse 05/30/2009  . Unspecified nasal polyp 02/21/2009  . Unspecified urinary incontinence 02/21/2009   Past Surgical History:  Procedure Laterality Date  . ABDOMINAL HYSTERECTOMY  1999   Dr. Collier Bullock  . BREAST BIOPSY  07/16/1990   benign left breast needle biopsy  Dr. Margot Chimes  . CARPAL TUNNEL RELEASE  04/07/2009   right Dr. Daylene Katayama  . CATARACT EXTRACTION W/ INTRAOCULAR LENS  IMPLANT, BILATERAL Bilateral   . INTRAMEDULLARY (IM) NAIL INTERTROCHANTERIC Right 09/17/2017   Procedure: INTRAMEDULLARY (IM) NAIL INTERTROCHANTRIC HIP;  Surgeon: Nicholes Stairs, MD;  Location: [place];  Service: Orthopedics;  Laterality: Right;  . OPEN REDUCTION  INTERNAL FIXATION (ORIF) DISTAL RADIAL FRACTURE Right 09/17/2017   Procedure: OPEN REDUCTION INTERNAL FIXATION (ORIF) DISTAL RADIAL FRACTURE;  Surgeon: Iran Planas, MD;  Location: [place];  Service: Orthopedics;  Laterality: Right;  .  TRIGGER FINGER RELEASE Left 08/09/2014   Procedure: RELEASE TRIGGER FINGER/A-1 PULLEY LEFT MIDDLE FINGER;  Surgeon: Daryll Brod, MD;  Location: [place];  Service: Orthopedics;  Laterality: Left;  REGIONAL/FAB    Allergies  Allergen Reactions  . Sulfa Antibiotics Itching, Rash and Other (See Comments)    "Allergic," per MAR  . Amoxicillin Other (See Comments)    Per [facility] 09/15/17 Has patient had a PCN reaction causing immediate rash, facial/tongue/throat swelling, SOB or lightheadedness with hypotension: Unknown Has patient had a PCN reaction causing severe rash involving mucus membranes or skin necrosis: Unknown Has patient had a PCN reaction that required hospitalization: Unknown Has patient had a PCN reaction occurring within the last 10 years: Unknown If all of the above answers are "NO", then may proceed with Cephalosporin use.  . Avelox [Moxifloxacin Hcl In Nacl] Itching  . Doxycycline Other (See Comments)    "Allergic," per MAR  . Hista-Tabs [Triprolidine-Pse] Other (See Comments)    "Passed out"  . Pyrilamine-Phenylephrine Other (See Comments)    "Allergic," per [facility] 09/15/17  . Septra [Sulfamethoxazole-Trimethoprim] Itching, Rash and Other (See Comments)    "Allergic," per MAR    Allergies as of 03/15/2019      Reactions   Sulfa Antibiotics Itching, Rash, Other (See Comments)   "Allergic," per MAR   Amoxicillin Other (See Comments)   Per [facility] 09/15/17 Has patient had a PCN reaction causing immediate rash, facial/tongue/throat swelling, SOB or lightheadedness with hypotension: Unknown Has patient had a PCN reaction causing severe rash involving mucus membranes or skin necrosis: Unknown Has patient had a PCN reaction that required hospitalization: Unknown Has patient had a PCN reaction occurring within the last 10 years: Unknown If all of the above answers are "NO", then may proceed with Cephalosporin use.   Avelox [moxifloxacin Hcl In Nacl] Itching   Doxycycline  Other (See Comments)   "Allergic," per MAR   Hista-tabs [triprolidine-pse] Other (See Comments)   "Passed out"   Pyrilamine-phenylephrine Other (See Comments)   "Allergic," per [facility] 09/15/17   Septra [sulfamethoxazole-trimethoprim] Itching, Rash, Other (See Comments)   "Allergic," per [facility]      Medication List       Accurate as of March 15, 2019 10:33 AM. If you have any questions, ask your nurse or doctor.        acetaminophen 325 MG tablet Commonly known as: TYLENOL Take 650 mg by mouth every 6 (six) hours as needed (for pain).   aspirin EC 81 MG tablet Take 81 mg by mouth See admin instructions. Take 81 mg by mouth in the morning on Tuesday and Saturday. 7am-11am   escitalopram 20 MG tablet Commonly known as: LEXAPRO Take 20 mg by mouth daily. In the morning 7am-11am.   famotidine 20 MG tablet Commonly known as: PEPCID Take 20 mg by mouth daily. In the morning 7am-11am.   Fluticasone-Salmeterol 250-50 MCG/DOSE Aepb Commonly known as: ADVAIR Inhale 1 puff into the lungs 2 (two) times daily. Morning: 7am-11am Evening: 4pm-7pm.  Rinse mouth after use   furosemide 20 MG tablet Commonly known as: LASIX Take 20 mg by mouth See admin instructions. Take 20 mg by mouth in the morning between 7am-11am and hold for SBP < 110   lactose free nutrition Liqd Take 237  mLs by mouth See admin instructions. Add 237 ml's to ice cream and consume two times a day. 10am & 4pm.   levothyroxine 25 MCG tablet Commonly known as: SYNTHROID Take 25 mcg by mouth daily before breakfast. 7:30am.   mirtazapine 15 MG tablet Commonly known as: REMERON Take 15 mg by mouth daily. 7:30pm-11pm.   OXYGEN Inhale 2 L/min into the lungs as needed (to keep O2 SATS >90%).   polyethylene glycol 17 g packet Commonly known as: MIRALAX / GLYCOLAX Take 17 g by mouth daily. Mix with 6-8 ounces of fluid of choice. Once daily 7am-11:00am.   polyvinyl alcohol 1.4 % ophthalmic solution Commonly known as:  LIQUIFILM TEARS Place 1 drop into both eyes 3 (three) times daily.   raloxifene 60 MG tablet Commonly known as: EVISTA Take 60 mg by mouth daily. 4pm-7pm.   Ventolin HFA 108 (90 Base) MCG/ACT inhaler Generic drug: albuterol Inhale 2 puffs into the lungs every 6 (six) hours as needed for shortness of breath (dyspnea).   zinc oxide 20 % ointment Apply to buttocks as needed every shift after incontinent episodes       Review of Systems  Review of Systems  Constitutional: Negative for activity change, appetite change, chills, diaphoresis, fatigue and fever.  HENT: Negative for mouth sores, postnasal drip, rhinorrhea, sinus pain and sore throat.   Respiratory: Negative for apnea, chest tightness, shortness of breath and wheezing.   Cardiovascular: Negative for chest pain, palpitations and leg swelling.  Gastrointestinal: Negative for abdominal distention, abdominal pain, constipation, diarrhea, nausea and vomiting.  Genitourinary: Negative for dysuria and frequency.  Musculoskeletal: Negative for arthralgias, joint swelling and myalgias.  Skin: Negative for rash.  Neurological: Negative for dizziness, syncope, weakness, light-headedness and numbness.  Psychiatric/Behavioral: Negative for behavioral problems, confusion and sleep disturbance.     Immunization History  Administered Date(s) Administered  . Influenza Whole 12/27/2011, 11/05/2012  . Influenza, High Dose Seasonal PF 11/19/2018  . Influenza-Unspecified 11/18/2013, 11/03/2014, 11/16/2015, 11/13/2016, 11/17/2017  . Moderna SARS-COVID-2 Vaccination 02/08/2019, 03/08/2019  . PFIZER SARS-COV-2 Vaccination 02/08/2019  . PPD Test 04/04/2008, 11/23/2014  . Pneumococcal Conjugate-13 09/30/2016  . Pneumococcal Polysaccharide-23 02/05/2008, 06/03/2008  . Td 02/05/2008, 06/03/2008   Pertinent  Health Maintenance Due  Topic Date Due  . INFLUENZA VACCINE  Completed  . DEXA SCAN  Completed  . PNA vac Low Risk Adult  Completed    Fall Risk  10/09/2017 09/23/2016 08/27/2016 05/30/2015 05/16/2015  Falls in the past year? Yes Yes Yes Yes No  Comment - - Emmi Telephone Survey: data to providers prior to load - -  Number falls in past yr: 2 or more 1 1 1  -  Comment - - Emmi Telephone Survey Actual Response = 1 - -  Injury with Fall? Yes No No Yes -  Comment broke R wrist - - - -  Risk Factor Category  - - - - -  Risk for fall due to : - - - History of fall(s);Impaired balance/gait;Impaired mobility -  Follow up - - - Falls evaluation completed;Education provided -   Functional Status Survey:    Vitals:   03/15/19 1023  BP: 128/74  Pulse: 61  Resp: 15  Temp: 97.6 F (36.4 C)  SpO2: 94%  Weight: 141 lb 3.2 oz (64 kg)  Height: 4\' 8"  (1.422 m)   Body mass index is 31.66 kg/m. Physical Exam  Constitutional: Oriented to person, place, and time. Well-developed and well-nourished.  HENT:  Head: Normocephalic.  Mouth/Throat: Oropharynx is  clear and moist.  Eyes: Pupils are equal, round, and reactive to light. No Redness in the eye No Discharge Neck: Neck supple.  Cardiovascular: Normal rate and normal heart sounds.  No murmur heard. Pulmonary/Chest: Effort normal and breath sounds normal. No respiratory distress. No wheezes. She has no rales.  Abdominal: Soft. Bowel sounds are normal. No distension. There is no tenderness. There is no rebound.  Musculoskeletal: No edema.  Lymphadenopathy: none Neurological: No Focal Deficits  Skin: Skin is warm and dry.  Psychiatric: Normal mood and affect. Behavior is normal. Thought content normal.    Labs reviewed: Recent Labs    01/07/19 0135 01/07/19 0135 01/07/19 0605 01/08/19 0357 01/09/19 0335  NA 141  --   --  138 143  K 4.0  --   --  4.8 4.1  CL 105  --   --  105 109  CO2 23  --   --  24 27  GLUCOSE 180*  --   --  169* 108*  BUN 13  --   --  26* 25*  CREATININE 0.99   < > 1.17* 1.41* 1.05*  CALCIUM 9.5  --   --  9.0 9.0   < > = values in this interval  not displayed.   Recent Labs    03/19/18 0000 06/17/18 0000  AST 14 14  ALT 11 11  ALKPHOS  --  45   Recent Labs    01/07/19 0135 01/07/19 0135 01/07/19 0605 01/07/19 0737 01/08/19 0357  WBC 12.9*   < > 7.9 6.9 11.2*  NEUTROABS 7.5  --   --   --   --   HGB 14.0   < > 13.5 13.2 12.3  HCT 45.7   < > 42.3 41.2 38.7  MCV 103.2*   < > 94.2 93.4 94.4  PLT 213   < > 259 258 235   < > = values in this interval not displayed.   Lab Results  Component Value Date   TSH 1.62 05/11/2018   Lab Results  Component Value Date   HGBA1C 6.0 06/17/2018   Lab Results  Component Value Date   CHOL 211 (A) 02/14/2014   HDL 47 02/14/2014   LDLCALC 133 02/14/2014   TRIG 155 02/14/2014    Significant Diagnostic Results in last 30 days:  No results found.  Assessment/Plan  Allergic conjunctivitis of right eye Will Try Patanol EYE drops for 2 weeks and reval if not better  Chronic bronchitis, No Wheezing on Exam Tessalon Pearls PRN for Cough On Ventolin and Advair Depression, recurrent (HCC) On Remeron and Lexapro Not Candidate for GDR Continue both for now  Edema, unspecified type On lasix Age-related osteoporosis Doing well on Evista Acquired hypothyroidism TSH normal in 4/20 Same dose of Synthroid CKD Stable Creat Family/ staff Communication:   Labs/tests ordered:

## 2019-03-15 NOTE — Progress Notes (Signed)
A user error has taken place.

## 2019-03-19 ENCOUNTER — Non-Acute Institutional Stay (SKILLED_NURSING_FACILITY): Payer: Medicare Other | Admitting: Internal Medicine

## 2019-03-19 ENCOUNTER — Encounter: Payer: Self-pay | Admitting: Internal Medicine

## 2019-03-19 DIAGNOSIS — J42 Unspecified chronic bronchitis: Secondary | ICD-10-CM | POA: Diagnosis not present

## 2019-03-19 DIAGNOSIS — M810 Age-related osteoporosis without current pathological fracture: Secondary | ICD-10-CM

## 2019-03-19 DIAGNOSIS — R4189 Other symptoms and signs involving cognitive functions and awareness: Secondary | ICD-10-CM

## 2019-03-19 DIAGNOSIS — R410 Disorientation, unspecified: Secondary | ICD-10-CM

## 2019-03-19 DIAGNOSIS — F339 Major depressive disorder, recurrent, unspecified: Secondary | ICD-10-CM | POA: Diagnosis not present

## 2019-03-19 DIAGNOSIS — E039 Hypothyroidism, unspecified: Secondary | ICD-10-CM

## 2019-03-19 NOTE — Progress Notes (Signed)
Location: [place] Room Number: 25 Place of Service:  SNF (31)  Provider:   Code Status: DNR Goals of Care:  Advanced Directives 03/15/2019  Does Patient Have a Medical Advance Directive? Yes  Type of Advance Directive Out of facility DNR (pink MOST or yellow form);Living will;Healthcare Power of Attorney  Does patient want to make changes to medical advance directive? No - Patient declined  Copy of [place] in Chart? Yes - validated most recent copy scanned in chart (See row information)  Pre-existing out of facility DNR order (yellow form or pink MOST form) Yellow form placed in chart (order not valid for inpatient use);Pink MOST form placed in chart (order not valid for inpatient use)     Chief Complaint  Patient presents with  . Acute Visit    Confusion    HPI: Patient is a 86 y.o. female seen today for an acute visit for Confusion Patient is a long-term care resident Patient has h/o Sinus Bradycardia, LE edema, Hypothyroidism, Depression, Chronic Sinusitis, H/o Hip Fracture,And Proximal Humerus Fracture due to Fall with Delayed healing Was recently admitted for Acute Bronchitis  Per Nurses patient has been Confused since yesterday. Usually she stays quiet and Answers Appropriately But since yesterday she has been talking to herself.  Very Confused She told me she needs to get to elevator to go to her room. It seems she thought she i s in Hospital She has no cough , Fever, SOB or Dysuria Does not look in any distress Eating well   Past Medical History:  Diagnosis Date  . Asthma   . Carpal tunnel syndrome 03/07/2009  . Disturbance of skin sensation 02/21/2009  . Diverticulosis of colon (without mention of hemorrhage) 02/16/2009  . Fracture of upper end of left humerus 11/18/14  . 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 Hypothyroid   . Osteoarthrosis, unspecified whether generalized or localized, unspecified site 02/16/2009  .  Other abnormal blood chemistry 06/19/2010   hyperglycemia  . Other and unspecified hyperlipidemia 10/24/2009  . Other atopic dermatitis and related conditions 02/16/2009  . Otosclerosis, unspecified 02/22/2003  . Pain in limb 12/05/2009  . Senile osteoporosis 06/16/2010  . Sensorineural hearing loss, unspecified 02/21/2009  . Syncope and collapse 05/30/2009  . Unspecified nasal polyp 02/21/2009  . Unspecified urinary incontinence 02/21/2009    Past Surgical History:  Procedure Laterality Date  . ABDOMINAL HYSTERECTOMY  1999   Dr. Collier Bullock  . BREAST BIOPSY  07/16/1990   benign left breast needle biopsy  Dr. Margot Chimes  . CARPAL TUNNEL RELEASE  04/07/2009   right Dr. Daylene Katayama  . CATARACT EXTRACTION W/ INTRAOCULAR LENS  IMPLANT, BILATERAL Bilateral   . INTRAMEDULLARY (IM) NAIL INTERTROCHANTERIC Right 09/17/2017   Procedure: INTRAMEDULLARY (IM) NAIL INTERTROCHANTRIC HIP;  Surgeon: Nicholes Stairs, MD;  Location: [place];  Service: Orthopedics;  Laterality: Right;  . OPEN REDUCTION INTERNAL FIXATION (ORIF) DISTAL RADIAL FRACTURE Right 09/17/2017   Procedure: OPEN REDUCTION INTERNAL FIXATION (ORIF) DISTAL RADIAL FRACTURE;  Surgeon: Iran Planas, MD;  Location: [place];  Service: Orthopedics;  Laterality: Right;  . TRIGGER FINGER RELEASE Left 08/09/2014   Procedure: RELEASE TRIGGER FINGER/A-1 PULLEY LEFT MIDDLE FINGER;  Surgeon: Daryll Brod, MD;  Location: [place];  Service: Orthopedics;  Laterality: Left;  REGIONAL/FAB    Allergies  Allergen Reactions  . Sulfa Antibiotics Itching, Rash and Other (See Comments)    "Allergic," per MAR  . Amoxicillin Other (See Comments)  Per [facility] 09/15/17 Has patient had a PCN reaction causing immediate rash, facial/tongue/throat swelling, SOB or lightheadedness with hypotension: Unknown Has patient had a PCN reaction causing severe rash involving mucus membranes or skin necrosis: Unknown Has patient had a PCN reaction that required hospitalization:  Unknown Has patient had a PCN reaction occurring within the last 10 years: Unknown If all of the above answers are "NO", then may proceed with Cephalosporin use.  . Avelox [Moxifloxacin Hcl In Nacl] Itching  . Doxycycline Other (See Comments)    "Allergic," per MAR  . Hista-Tabs [Triprolidine-Pse] Other (See Comments)    "Passed out"  . Pyrilamine-Phenylephrine Other (See Comments)    "Allergic," per [facility] 09/15/17  . Septra [Sulfamethoxazole-Trimethoprim] Itching, Rash and Other (See Comments)    "Allergic," per [facility]    Outpatient Encounter Medications as of 03/19/2019  Medication Sig  . acetaminophen (TYLENOL) 325 MG tablet Take 650 mg by mouth every 6 (six) hours as needed (for pain).   Marland Kitchen albuterol (VENTOLIN HFA) 108 (90 Base) MCG/ACT inhaler Inhale 2 puffs into the lungs every 6 (six) hours as needed for shortness of breath (dyspnea).   Marland Kitchen aspirin EC 81 MG tablet Take 81 mg by mouth See admin instructions. Take 81 mg by mouth in the morning on Tuesday and Saturday. 7am-11am  . escitalopram (LEXAPRO) 20 MG tablet Take 20 mg by mouth daily. In the morning 7am-11am.  . famotidine (PEPCID) 20 MG tablet Take 20 mg by mouth daily. In the morning 7am-11am.  . Fluticasone-Salmeterol (ADVAIR) 250-50 MCG/DOSE AEPB Inhale 1 puff into the lungs 2 (two) times daily. Morning: 7am-11am Evening: 4pm-7pm.  Rinse mouth after use  . furosemide (LASIX) 20 MG tablet Take 20 mg by mouth See admin instructions. Take 20 mg by mouth in the morning between 7am-11am and hold for SBP < 110  . lactose free nutrition (BOOST PLUS) LIQD Take 237 mLs by mouth See admin instructions. Add 237 ml's to ice cream and consume two times a day. 10am & 4pm.  . levothyroxine (SYNTHROID, LEVOTHROID) 25 MCG tablet Take 25 mcg by mouth daily before breakfast. 7:30am.  . mirtazapine (REMERON) 15 MG tablet Take 15 mg by mouth daily. 7:30pm-11pm.  . OXYGEN Inhale 2 L/min into the lungs as needed (to keep O2 SATS >90%).   . polyethylene  glycol (MIRALAX / GLYCOLAX) packet Take 17 g by mouth daily. Mix with 6-8 ounces of fluid of choice. Once daily 7am-11:00am.  . polyvinyl alcohol (LIQUIFILM TEARS) 1.4 % ophthalmic solution Place 1 drop into both eyes 3 (three) times daily.  . raloxifene (EVISTA) 60 MG tablet Take 60 mg by mouth daily. 4pm-7pm.   . zinc oxide 20 % ointment Apply to buttocks as needed every shift after incontinent episodes   No facility-administered encounter medications on file as of 03/19/2019.    Review of Systems:  Review of Systems  Unable to perform ROS: Other    Health Maintenance  Topic Date Due  . TETANUS/TDAP  06/04/2018  . INFLUENZA VACCINE  Completed  . DEXA SCAN  Completed  . PNA vac Low Risk Adult  Completed    Physical Exam: There were no vitals filed for this visit. There is no height or weight on file to calculate BMI. Physical Exam Vitals reviewed.  Constitutional:      Appearance: Normal appearance.  HENT:     Head: Normocephalic.     Nose: Nose normal.     Mouth/Throat:     Mouth: Mucous membranes are  moist.     Pharynx: Oropharynx is clear.  Eyes:     Pupils: Pupils are equal, round, and reactive to light.  Cardiovascular:     Rate and Rhythm: Normal rate and regular rhythm.     Pulses: Normal pulses.  Pulmonary:     Effort: Pulmonary effort is normal. No respiratory distress.     Breath sounds: Normal breath sounds. No wheezing or rales.  Abdominal:     General: Abdomen is flat. Bowel sounds are normal.     Palpations: Abdomen is soft.  Musculoskeletal:        General: No swelling.     Cervical back: Neck supple.  Skin:    General: Skin is warm.  Neurological:     General: No focal deficit present.     Mental Status: She is alert.     Comments: She was talking more today and did look more Confused. Was asking me how she can pack her bags to go home  Psychiatric:        Mood and Affect: Mood normal.        Thought Content: Thought content normal.     Labs  reviewed: Basic Metabolic Panel: Recent Labs    05/11/18 0000 06/17/18 0000 01/07/19 0135 01/07/19 0135 01/07/19 0605 01/08/19 0357 01/09/19 0335  NA  --    < > 141  --   --  138 143  K  --    < > 4.0  --   --  4.8 4.1  CL  --   --  105  --   --  105 109  CO2  --   --  23  --   --  24 27  GLUCOSE  --   --  180*  --   --  169* 108*  BUN  --    < > 13  --   --  26* 25*  CREATININE  --    < > 0.99   < > 1.17* 1.41* 1.05*  CALCIUM  --   --  9.5  --   --  9.0 9.0  TSH 1.62  --   --   --   --   --   --    < > = values in this interval not displayed.   Liver Function Tests: Recent Labs    06/17/18 0000  AST 14  ALT 11  ALKPHOS 45   No results for input(s): LIPASE, AMYLASE in the last 8760 hours. No results for input(s): AMMONIA in the last 8760 hours. CBC: Recent Labs    01/07/19 0135 01/07/19 0135 01/07/19 0605 01/07/19 0737 01/08/19 0357  WBC 12.9*   < > 7.9 6.9 11.2*  NEUTROABS 7.5  --   --   --   --   HGB 14.0   < > 13.5 13.2 12.3  HCT 45.7   < > 42.3 41.2 38.7  MCV 103.2*   < > 94.2 93.4 94.4  PLT 213   < > 259 258 235   < > = values in this interval not displayed.   Lipid Panel: No results for input(s): CHOL, HDL, LDLCALC, TRIG, CHOLHDL, LDLDIRECT in the last 8760 hours. Lab Results  Component Value Date   HGBA1C 6.0 06/17/2018    Procedures since last visit: No results found.  Assessment/Plan  Confusion Exam is Benign Will do CMp and CBC for now Continue to monitor Vitals  Chronic bronchitis, unspecified chronic bronchitis type ([place]) Seemed stable  on Advair Depression, recurrent (HCC) On Lexapro and Remeron  LE edema ON Lasix Age-related osteoporosis without current pathological fracture Continue Evista  Acquired hypothyroidism TSH normal in 4/20   Labs/tests ordered:  * No order type specified * Next appt:  Visit date not found

## 2019-03-24 ENCOUNTER — Non-Acute Institutional Stay (SKILLED_NURSING_FACILITY): Payer: Medicare Other | Admitting: Internal Medicine

## 2019-03-24 ENCOUNTER — Encounter: Payer: Self-pay | Admitting: Internal Medicine

## 2019-03-24 DIAGNOSIS — E039 Hypothyroidism, unspecified: Secondary | ICD-10-CM

## 2019-03-24 DIAGNOSIS — F339 Major depressive disorder, recurrent, unspecified: Secondary | ICD-10-CM | POA: Diagnosis not present

## 2019-03-24 DIAGNOSIS — R4189 Other symptoms and signs involving cognitive functions and awareness: Secondary | ICD-10-CM

## 2019-03-24 DIAGNOSIS — J42 Unspecified chronic bronchitis: Secondary | ICD-10-CM | POA: Diagnosis not present

## 2019-03-24 DIAGNOSIS — R410 Disorientation, unspecified: Secondary | ICD-10-CM | POA: Diagnosis not present

## 2019-03-24 DIAGNOSIS — M810 Age-related osteoporosis without current pathological fracture: Secondary | ICD-10-CM

## 2019-03-24 LAB — BASIC METABOLIC PANEL
BUN: 20 (ref 4–21)
CO2: 25 — AB (ref 13–22)
Chloride: 108 (ref 99–108)
Creatinine: 1 (ref 0.5–1.1)
Glucose: 106
Potassium: 4.3 (ref 3.4–5.3)
Sodium: 141 (ref 137–147)

## 2019-03-24 LAB — CBC: RBC: 4.57 (ref 3.87–5.11)

## 2019-03-24 LAB — HEPATIC FUNCTION PANEL
ALT: 18 (ref 7–35)
AST: 20 (ref 13–35)
Alkaline Phosphatase: 57 (ref 25–125)
Bilirubin, Total: 0.4

## 2019-03-24 LAB — CBC AND DIFFERENTIAL
HCT: 41 (ref 36–46)
Hemoglobin: 13.8 (ref 12.0–16.0)
Neutrophils Absolute: 3329
Platelets: 232 (ref 150–399)
WBC: 7.3

## 2019-03-24 LAB — COMPREHENSIVE METABOLIC PANEL
Albumin: 3.5 (ref 3.5–5.0)
Calcium: 9.1 (ref 8.7–10.7)
Globulin: 2.7

## 2019-03-24 LAB — TSH: TSH: 1.2 (ref 0.41–5.90)

## 2019-03-24 NOTE — Progress Notes (Signed)
Location:   [facility] Room Number: 25 Place of Service:  SNF (31) Provider:  Veleta Miners MD  Mast, Man X, NP  Patient Care Team: Mast, Man X, NP as PCP - General (Internal Medicine) Newton Pigg, MD as Consulting Physician (Obstetrics and Gynecology) Monna Fam, MD as Consulting Physician (Ophthalmology) Melida Quitter, MD as Consulting Physician (Otolaryngology) Myrlene Broker, MD as Consulting Physician (Urology) [place] Virgie Dad, MD as Consulting Physician (Internal Medicine)  Extended Emergency Contact Information Primary Emergency Contact: Blissett,[name] Address: [place], [place] Phone: [phone] Work Phone: [phone] Mobile Phone: [phone] Relation: Son Secondary Emergency Contact: Teofilo [place] of [place] Phone: [phone] Work Phone: [phone] Relation: Friend  Code Status:  DNR Goals of care: Advanced Directive information Advanced Directives 03/24/2019  Does Patient Have a Medical Advance Directive? Yes  Type of Paramedic of [place];Living will;Out of facility DNR (pink MOST or yellow form)  Does patient want to make changes to medical advance directive? No - Patient declined  Copy of [place] in Chart? Yes - validated most recent copy scanned in chart (See row information)  Pre-existing out of facility DNR order (yellow form or pink MOST form) Pink MOST form placed in chart (order not valid for inpatient use);Yellow form placed in chart (order not valid for inpatient use)     Chief Complaint  Patient presents with  . Acute Visit    Confusion    HPI:  Pt is a 86 y.o. female seen today for an acute visit for Follow up of her Confusion  Patient is a long-term care resident Patient has h/o Sinus Bradycardia, LE edema, Hypothyroidism, Depression, Chronic Sinusitis, H/o  Hip Fracture,And Proximal Humerus Fracture due to Fall with Delayed healing Was recently admitted for Acute Bronchitis  Per Nurses and her family patient has been Confused over past few days. She has been talking about her husband who has been dead for 15 years. Taking about seeing the Nephew who does not live in city anymore. She is also taking about going to her own apartment which she does not have. This all is new for her. Usually she is quiet and mostly coherent  We did labs on her and her repeat Labs are pending today. So far everything is Normal with no Acute signs of Infection Patient seems more at her baseline today. Answered Appropriately. Has not has any fever , cough or Dysuria  Past Medical History:  Diagnosis Date  . Asthma   . Carpal tunnel syndrome 03/07/2009  . Disturbance of skin sensation 02/21/2009  . Diverticulosis of colon (without mention of hemorrhage) 02/16/2009  . Fracture of upper end of left humerus 11/18/14  . 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 Hypothyroid   . Osteoarthrosis, unspecified whether generalized or localized, unspecified site 02/16/2009  . Other abnormal blood chemistry 06/19/2010   hyperglycemia  . Other and unspecified hyperlipidemia 10/24/2009  . Other atopic dermatitis and related conditions 02/16/2009  . Otosclerosis, unspecified 02/22/2003  . Pain in limb 12/05/2009  . Senile osteoporosis 06/16/2010  . Sensorineural hearing loss, unspecified 02/21/2009  . Syncope and collapse 05/30/2009  . Unspecified nasal polyp 02/21/2009  . Unspecified urinary incontinence 02/21/2009   Past Surgical History:  Procedure Laterality Date  . ABDOMINAL HYSTERECTOMY  1999   Dr. Collier Bullock  . BREAST BIOPSY  07/16/1990   benign left breast needle biopsy  Dr. Margot Chimes  . CARPAL TUNNEL RELEASE  04/07/2009   right Dr. Daylene Katayama  . CATARACT EXTRACTION W/ INTRAOCULAR LENS  IMPLANT, BILATERAL Bilateral   . INTRAMEDULLARY (IM) NAIL INTERTROCHANTERIC Right 09/17/2017   Procedure:  INTRAMEDULLARY (IM) NAIL INTERTROCHANTRIC HIP;  Surgeon: Nicholes Stairs, MD;  Location: [facility];  Service: Orthopedics;  Laterality: Right;  . OPEN REDUCTION INTERNAL FIXATION (ORIF) DISTAL RADIAL FRACTURE Right 09/17/2017   Procedure: OPEN REDUCTION INTERNAL FIXATION (ORIF) DISTAL RADIAL FRACTURE;  Surgeon: Iran Planas, MD;  Location: [facility];  Service: Orthopedics;  Laterality: Right;  . TRIGGER FINGER RELEASE Left 08/09/2014   Procedure: RELEASE TRIGGER FINGER/A-1 PULLEY LEFT MIDDLE FINGER;  Surgeon: Daryll Brod, MD;  Location: [place];  Service: Orthopedics;  Laterality: Left;  REGIONAL/FAB    Allergies  Allergen Reactions  . Sulfa Antibiotics Itching, Rash and Other (See Comments)    "Allergic," per MAR  . Amoxicillin Other (See Comments)    Per [facility] 09/15/17 Has patient had a PCN reaction causing immediate rash, facial/tongue/throat swelling, SOB or lightheadedness with hypotension: Unknown Has patient had a PCN reaction causing severe rash involving mucus membranes or skin necrosis: Unknown Has patient had a PCN reaction that required hospitalization: Unknown Has patient had a PCN reaction occurring within the last 10 years: Unknown If all of the above answers are "NO", then may proceed with Cephalosporin use.  . Avelox [Moxifloxacin Hcl In Nacl] Itching  . Doxycycline Other (See Comments)    "Allergic," per MAR  . Hista-Tabs [Triprolidine-Pse] Other (See Comments)    "Passed out"  . Pyrilamine-Phenylephrine Other (See Comments)    "Allergic," per [facility] 09/15/17  . Septra [Sulfamethoxazole-Trimethoprim] Itching, Rash and Other (See Comments)    "Allergic," per MAR    Allergies as of 03/24/2019      Reactions   Sulfa Antibiotics Itching, Rash, Other (See Comments)   "Allergic," per MAR   Amoxicillin Other (See Comments)   Per [facility] 09/15/17 Has patient had a PCN reaction causing immediate rash, facial/tongue/throat swelling, SOB or lightheadedness with hypotension:  Unknown Has patient had a PCN reaction causing severe rash involving mucus membranes or skin necrosis: Unknown Has patient had a PCN reaction that required hospitalization: Unknown Has patient had a PCN reaction occurring within the last 10 years: Unknown If all of the above answers are "NO", then may proceed with Cephalosporin use.   Avelox [moxifloxacin Hcl In Nacl] Itching   Doxycycline Other (See Comments)   "Allergic," per MAR   Hista-tabs [triprolidine-pse] Other (See Comments)   "Passed out"   Pyrilamine-phenylephrine Other (See Comments)   "Allergic," per [facility] 09/15/17   Septra [sulfamethoxazole-trimethoprim] Itching, Rash, Other (See Comments)   "Allergic," per [facility]      Medication List       Accurate as of March 24, 2019 10:43 AM. If you have any questions, ask your nurse or doctor.        acetaminophen 325 MG tablet Commonly known as: TYLENOL Take 650 mg by mouth every 6 (six) hours as needed (for pain).   aspirin EC 81 MG tablet Take 81 mg by mouth See admin instructions. Take 81 mg by mouth in the morning on Tuesday and Saturday. 7am-11am   benzonatate 100 MG capsule Commonly known as: TESSALON Take by mouth 3 (three) times daily as needed for cough.   escitalopram 20 MG tablet Commonly known as: LEXAPRO Take 20 mg by mouth daily. In the morning  7am-11am.   famotidine 20 MG tablet Commonly known as: PEPCID Take 20 mg by mouth daily. In the morning 7am-11am.   Fluticasone-Salmeterol 250-50 MCG/DOSE Aepb Commonly known as: ADVAIR Inhale 1 puff into the lungs 2 (two) times daily. Morning: 7am-11am Evening: 4pm-7pm.  Rinse mouth after use   furosemide 20 MG tablet Commonly known as: LASIX Take 20 mg by mouth See admin instructions. Take 20 mg by mouth in the morning between 7am-11am and hold for SBP < 110   lactose free nutrition Liqd Take 237 mLs by mouth See admin instructions. Add 237 ml's to ice cream and consume two times a day. 10am & 4pm.    levothyroxine 25 MCG tablet Commonly known as: SYNTHROID Take 25 mcg by mouth daily before breakfast. 7:30am.   mirtazapine 15 MG tablet Commonly known as: REMERON Take 15 mg by mouth daily. 7:30pm-11pm.   olopatadine 0.1 % ophthalmic solution Commonly known as: PATANOL 1 drop 2 (two) times daily.   OXYGEN Inhale 2 L/min into the lungs as needed (to keep O2 SATS >90%).   polyethylene glycol 17 g packet Commonly known as: MIRALAX / GLYCOLAX Take 17 g by mouth daily. Mix with 6-8 ounces of fluid of choice. Once daily 7am-11:00am.   polyvinyl alcohol 1.4 % ophthalmic solution Commonly known as: LIQUIFILM TEARS Place 1 drop into both eyes 3 (three) times daily.   raloxifene 60 MG tablet Commonly known as: EVISTA Take 60 mg by mouth daily. 4pm-7pm.   Ventolin HFA 108 (90 Base) MCG/ACT inhaler Generic drug: albuterol Inhale 2 puffs into the lungs every 6 (six) hours as needed for shortness of breath (dyspnea).   zinc oxide 20 % ointment Apply to buttocks as needed every shift after incontinent episodes       Review of Systems Review of Systems  Constitutional: Negative for activity change, appetite change, chills, diaphoresis, fatigue and fever.  HENT: Negative for mouth sores, postnasal drip, rhinorrhea, sinus pain and sore throat.   Respiratory: Negative for apnea, cough, chest tightness, shortness of breath and wheezing.   Cardiovascular: Negative for chest pain, palpitations and leg swelling.  Gastrointestinal: Negative for abdominal distention, abdominal pain, constipation, diarrhea, nausea and vomiting.  Genitourinary: Negative for dysuria and frequency.  Musculoskeletal: Negative for arthralgias, joint swelling and myalgias.  Skin: Negative for rash.  Neurological: Negative for dizziness, syncope, weakness, light-headedness and numbness.  Psychiatric/Behavioral: Negative for behavioral problems, confusion and sleep disturbance.    Immunization History   Administered Date(s) Administered  . Influenza Whole 12/27/2011, 11/05/2012  . Influenza, High Dose Seasonal PF 11/19/2018  . Influenza-Unspecified 11/18/2013, 11/03/2014, 11/16/2015, 11/13/2016, 11/17/2017  . Moderna SARS-COVID-2 Vaccination 02/08/2019, 03/08/2019  . PFIZER SARS-COV-2 Vaccination 02/08/2019  . PPD Test 04/04/2008, 11/23/2014  . Pneumococcal Conjugate-13 09/30/2016  . Pneumococcal Polysaccharide-23 02/05/2008, 06/03/2008  . Td 02/05/2008, 06/03/2008   Pertinent  Health Maintenance Due  Topic Date Due  . INFLUENZA VACCINE  Completed  . DEXA SCAN  Completed  . PNA vac Low Risk Adult  Completed   Fall Risk  10/09/2017 09/23/2016 08/27/2016 05/30/2015 05/16/2015  Falls in the past year? Yes Yes Yes Yes No  Comment - - Emmi Telephone Survey: data to providers prior to load - -  Number falls in past yr: 2 or more 1 1 1  -  Comment - - [facility]i Telephone Survey Actual Response = 1 - -  Injury with Fall? Yes No No Yes -  Comment broke R wrist - - - -  Risk Factor Category  - - - - -  Risk for fall due to : - - - History of fall(s);Impaired balance/gait;Impaired mobility -  Follow up - - - Falls evaluation completed;Education provided -   Functional Status Survey:    Vitals:   03/24/19 1039  BP: 124/64  Pulse: 62  Resp: 18  Temp: (!) 97 F (36.1 C)  SpO2: 90%  Weight: 141 lb 3.2 oz (64 kg)  Height: 4\' 8"  (1.422 m)   Body mass index is 31.66 kg/m. Physical Exam Constitutional:  Well-developed and well-nourished.  HENT:  Head: Normocephalic.  Mouth/Throat: Oropharynx is clear and moist.  Eyes: Pupils are equal, round, and reactive to light.  Neck: Neck supple.  Cardiovascular: Normal rate and normal heart sounds.  No murmur heard. Pulmonary/Chest: Effort normal and breath sounds normal. No respiratory distress. No wheezes. She has no rales.  Abdominal: Soft. Bowel sounds are normal. No distension. There is no tenderness. There is no rebound.  Musculoskeletal: No  edema.  Lymphadenopathy: none Neurological: Her Mental status was at baseline today. She told me about her family and was very Coherent No Focal Deficits Skin: Skin is warm and dry.  Psychiatric: Normal mood and affect. Behavior is normal. Thought content normal.   Labs reviewed: Recent Labs    01/07/19 0135 01/07/19 0605 01/08/19 0357 01/09/19 0335 03/24/19 0000  NA 141   < > 138 143 141  K 4.0   < > 4.8 4.1 4.3  CL 105   < > 105 109 108  CO2 23   < > 24 27 25*  GLUCOSE 180*  --  169* 108*  --   BUN 13   < > 26* 25* 20  CREATININE 0.99   < > 1.41* 1.05* 1.0  CALCIUM 9.5   < > 9.0 9.0 9.1   < > = values in this interval not displayed.   Recent Labs    06/17/18 0000 03/24/19 0000  AST 14 20  ALT 11 18  ALKPHOS 45 57  ALBUMIN  --  3.5   Recent Labs    01/07/19 0135 01/07/19 0135 01/07/19 0605 01/07/19 0605 01/07/19 0737 01/08/19 0357 03/24/19 0000  WBC 12.9*   < > 7.9   < > 6.9 11.2* 7.3  NEUTROABS 7.5  --   --   --   --   --  3,329  HGB 14.0   < > 13.5   < > 13.2 12.3 13.8  HCT 45.7   < > 42.3   < > 41.2 38.7 41  MCV 103.2*   < > 94.2  --  93.4 94.4  --   PLT 213   < > 259   < > 258 235 232   < > = values in this interval not displayed.   Lab Results  Component Value Date   TSH 1.20 03/24/2019   Lab Results  Component Value Date   HGBA1C 6.0 06/17/2018   Lab Results  Component Value Date   CHOL 211 (A) 02/14/2014   HDL 47 02/14/2014   LDLCALC 133 02/14/2014   TRIG 155 02/14/2014    Significant Diagnostic Results in last 30 days:  No results found.  Assessment/Plan  Confusion D/W the Son and Nurses. She did have confusion over the weekend including talking about her husband and Nephew. Her labs have been negative for any acute Infection Will continue to monitor Repeat Labs from today are pending. She does look at her baseline today with conversing more normally Meds reviewed. She has been on  remeron for 3-4 months. If Confusion persists  will reduce the dose  Chronic bronchitis,  Stable on Advair Depression, On Lexapro and Remeron  Age-related osteoporosis  Continue Evista Acquired hypothyroidism Repeat TSH done was normal 02/17    Family/ staff Communication:   Labs/tests ordered:

## 2019-03-26 ENCOUNTER — Encounter: Payer: Self-pay | Admitting: Nurse Practitioner

## 2019-03-26 DIAGNOSIS — N183 Chronic kidney disease, stage 3 unspecified: Secondary | ICD-10-CM | POA: Insufficient documentation

## 2019-04-06 ENCOUNTER — Encounter: Payer: Self-pay | Admitting: Nurse Practitioner

## 2019-04-06 ENCOUNTER — Non-Acute Institutional Stay (SKILLED_NURSING_FACILITY): Payer: Medicare Other | Admitting: Nurse Practitioner

## 2019-04-06 DIAGNOSIS — K219 Gastro-esophageal reflux disease without esophagitis: Secondary | ICD-10-CM

## 2019-04-06 DIAGNOSIS — E039 Hypothyroidism, unspecified: Secondary | ICD-10-CM | POA: Diagnosis not present

## 2019-04-06 DIAGNOSIS — R55 Syncope and collapse: Secondary | ICD-10-CM | POA: Diagnosis not present

## 2019-04-06 DIAGNOSIS — K5900 Constipation, unspecified: Secondary | ICD-10-CM

## 2019-04-06 DIAGNOSIS — I739 Peripheral vascular disease, unspecified: Secondary | ICD-10-CM

## 2019-04-06 DIAGNOSIS — F339 Major depressive disorder, recurrent, unspecified: Secondary | ICD-10-CM

## 2019-04-06 NOTE — Assessment & Plan Note (Signed)
Stable, continue MiraLax.  °

## 2019-04-06 NOTE — Progress Notes (Signed)
Location:   [place] Room Number: 25 Place of Service:  SNF (31) Provider:  Ozzy Bohlken X, NP  Mcarthur Ivins X, NP  Patient Care Team: Treavor Blomquist X, NP as PCP - General (Internal Medicine) Newton Pigg, MD as Consulting Physician (Obstetrics and Gynecology) Monna Fam, MD as Consulting Physician (Ophthalmology) Melida Quitter, MD as Consulting Physician (Otolaryngology) Myrlene Broker, MD as Consulting Physician (Urology) [place] Virgie Dad, MD as Consulting Physician (Internal Medicine)  Extended Emergency Contact Information Primary Emergency Contact: Mounce,[name] Address: [place], [place] Phone: [phone] Work Phone: [phone] Mobile Phone: [phone] Relation: Son Secondary Emergency Contact: Teofilo [place] of [place] Phone: [phone] Work Phone: [phone] Relation: Friend  Code Status:  DNR Goals of care: Advanced Directive information Advanced Directives 04/06/2019  Does Patient Have a Medical Advance Directive? Yes  Type of Paramedic of [place];Out of facility DNR (pink MOST or yellow form);Living will  Does patient want to make changes to medical advance directive? No - Patient declined  Copy of [place] in Chart? Yes - validated most recent copy scanned in chart (See row information)  Pre-existing out of facility DNR order (yellow form or pink MOST form) Yellow form placed in chart (order not valid for inpatient use);Pink MOST form placed in chart (order not valid for inpatient use)     Chief Complaint  Patient presents with  . Acute Visit    Fainting episode last night    HPI:  Pt is a 86 y.o. female seen today for an acute visit for reported the patient fainted 9pm 04/05/19 when ADL care was given, Bp 180/86, resolved and Bp 130/63 normalized w/o intervention. The patient denied  pain, discomfort, SOB, chest pain/pressures, palpitation, dysuria, urinary urgency, she is afebrile, occasional cough remains no change. Her goal of care is comfort measures, no IVF/ABT. Her mood is stable on Escitalopram 20mg  qd, Mirtazapine 15mg  qd. GERD, stable, on Famotidine 20mg  qd. Edema, not apparent on Furosemide 20mg  qd. Hypothyroidism, stable, on Levothyroxine 74mcg qd. Constipation, stable, on MiraLax qd.    Past Medical History:  Diagnosis Date  . Asthma   . Carpal tunnel syndrome 03/07/2009  . Disturbance of skin sensation 02/21/2009  . Diverticulosis of colon (without mention of hemorrhage) 02/16/2009  . Fracture of upper end of left humerus 11/18/14  . 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 Hypothyroid   . Osteoarthrosis, unspecified whether generalized or localized, unspecified site 02/16/2009  . Other abnormal blood chemistry 06/19/2010   hyperglycemia  . Other and unspecified hyperlipidemia 10/24/2009  . Other atopic dermatitis and related conditions 02/16/2009  . Otosclerosis, unspecified 02/22/2003  . Pain in limb 12/05/2009  . Senile osteoporosis 06/16/2010  . Sensorineural hearing loss, unspecified 02/21/2009  . Syncope and collapse 05/30/2009  . Unspecified nasal polyp 02/21/2009  . Unspecified urinary incontinence 02/21/2009   Past Surgical History:  Procedure Laterality Date  . ABDOMINAL HYSTERECTOMY  1999   Dr. Collier Bullock  . BREAST BIOPSY  07/16/1990   benign left breast needle biopsy  Dr. Margot Chimes  . CARPAL TUNNEL RELEASE  04/07/2009   right Dr. Daylene Katayama  . CATARACT EXTRACTION W/ INTRAOCULAR LENS  IMPLANT, BILATERAL Bilateral   . INTRAMEDULLARY (IM) NAIL INTERTROCHANTERIC Right 09/17/2017   Procedure: INTRAMEDULLARY (IM) NAIL INTERTROCHANTRIC HIP;  Surgeon: Nicholes Stairs, MD;  Location: [place];  Service: Orthopedics;  Laterality: Right;  . OPEN REDUCTION INTERNAL FIXATION (ORIF) DISTAL RADIAL FRACTURE Right 09/17/2017   Procedure: OPEN REDUCTION INTERNAL FIXATION (ORIF)  DISTAL RADIAL FRACTURE;  Surgeon: Iran Planas, MD;  Location: [facility];  Service: Orthopedics;  Laterality: Right;  . TRIGGER FINGER RELEASE Left 08/09/2014   Procedure: RELEASE TRIGGER FINGER/A-1 PULLEY LEFT MIDDLE FINGER;  Surgeon: Daryll Brod, MD;  Location: [place];  Service: Orthopedics;  Laterality: Left;  REGIONAL/FAB    Allergies  Allergen Reactions  . Sulfa Antibiotics Itching, Rash and Other (See Comments)    "Allergic," per MAR  . Amoxicillin Other (See Comments)    Per [facility] 09/15/17 Has patient had a PCN reaction causing immediate rash, facial/tongue/throat swelling, SOB or lightheadedness with hypotension: Unknown Has patient had a PCN reaction causing severe rash involving mucus membranes or skin necrosis: Unknown Has patient had a PCN reaction that required hospitalization: Unknown Has patient had a PCN reaction occurring within the last 10 years: Unknown If all of the above answers are "NO", then may proceed with Cephalosporin use.  . Avelox [Moxifloxacin Hcl In Nacl] Itching  . Doxycycline Other (See Comments)    "Allergic," per MAR  . Hista-Tabs [Triprolidine-Pse] Other (See Comments)    "Passed out"  . Pyrilamine-Phenylephrine Other (See Comments)    "Allergic," per [facility] 09/15/17  . Septra [Sulfamethoxazole-Trimethoprim] Itching, Rash and Other (See Comments)    "Allergic," per MAR    Allergies as of 04/06/2019      Reactions   Sulfa Antibiotics Itching, Rash, Other (See Comments)   "Allergic," per MAR   Amoxicillin Other (See Comments)   Per [facility] 09/15/17 Has patient had a PCN reaction causing immediate rash, facial/tongue/throat swelling, SOB or lightheadedness with hypotension: Unknown Has patient had a PCN reaction causing severe rash involving mucus membranes or skin necrosis: Unknown Has patient had a PCN reaction that required hospitalization: Unknown Has patient had a PCN reaction occurring within the last 10 years: Unknown If all of the above  answers are "NO", then may proceed with Cephalosporin use.   Avelox [moxifloxacin Hcl In Nacl] Itching   Doxycycline Other (See Comments)   "Allergic," per MAR   Hista-tabs [triprolidine-pse] Other (See Comments)   "Passed out"   Pyrilamine-phenylephrine Other (See Comments)   "Allergic," per [facility] 09/15/17   Septra [sulfamethoxazole-trimethoprim] Itching, Rash, Other (See Comments)   "Allergic," per [facility]      Medication List       Accurate as of April 06, 2019 11:56 AM. If you have any questions, ask your nurse or doctor.        acetaminophen 325 MG tablet Commonly known as: TYLENOL Take 650 mg by mouth every 6 (six) hours as needed (for pain).   aspirin EC 81 MG tablet Take 81 mg by mouth See admin instructions. Take 81 mg by mouth in the morning on Tuesday and Saturday. 7am-11am   benzonatate 100 MG capsule Commonly known as: TESSALON Take by mouth 3 (three) times daily as needed for cough.   escitalopram 20 MG tablet Commonly known as: LEXAPRO Take 20 mg by mouth daily. In the morning 7am-11am.   famotidine 20 MG tablet Commonly known as: PEPCID Take 20 mg by mouth daily. In the morning 7am-11am.   Fluticasone-Salmeterol 250-50 MCG/DOSE Aepb Commonly known as: ADVAIR Inhale 1 puff into the lungs 2 (two) times daily. Morning: 7am-11am Evening: 4pm-7pm.  Rinse mouth after use   furosemide 20 MG tablet Commonly known as: LASIX Take 20  mg by mouth See admin instructions. Take 20 mg by mouth in the morning between 7am-11am and hold for SBP < 110   lactose free nutrition Liqd Take 237 mLs by mouth See admin instructions. Add 237 ml's to ice cream and consume two times a day. 10am & 4pm.   levothyroxine 25 MCG tablet Commonly known as: SYNTHROID Take 25 mcg by mouth daily before breakfast. 7:30am.   mirtazapine 15 MG tablet Commonly known as: REMERON Take 15 mg by mouth daily. 7:30pm-11pm.   olopatadine 0.1 % ophthalmic solution Commonly known as: PATANOL 1 drop 2  (two) times daily.   OXYGEN Inhale 2 L/min into the lungs as needed (to keep O2 SATS >90%).   polyethylene glycol 17 g packet Commonly known as: MIRALAX / GLYCOLAX Take 17 g by mouth daily. Mix with 6-8 ounces of fluid of choice. Once daily 7am-11:00am.   polyvinyl alcohol 1.4 % ophthalmic solution Commonly known as: LIQUIFILM TEARS Place 1 drop into both eyes 3 (three) times daily.   raloxifene 60 MG tablet Commonly known as: EVISTA Take 60 mg by mouth daily. 4pm-7pm.   Ventolin HFA 108 (90 Base) MCG/ACT inhaler Generic drug: albuterol Inhale 2 puffs into the lungs every 6 (six) hours as needed for shortness of breath (dyspnea).   zinc oxide 20 % ointment Apply to buttocks as needed every shift after incontinent episodes      ROS was provided with assistance of staff.  Review of Systems  Constitutional: Negative for activity change, appetite change, chills, diaphoresis, fatigue and fever.  HENT: Positive for hearing loss. Negative for congestion and voice change.   Eyes: Negative for visual disturbance.  Respiratory: Positive for shortness of breath. Negative for cough and wheezing.        DOE  Cardiovascular: Negative for chest pain, palpitations and leg swelling.  Gastrointestinal: Negative for abdominal distention, constipation, nausea and vomiting.  Genitourinary: Negative for difficulty urinating, dysuria and urgency.  Musculoskeletal: Positive for arthralgias and gait problem.  Skin: Negative for color change.  Neurological: Negative for dizziness, speech difficulty, weakness and headaches.       Memory lapses. Fainting episode last night, resolved w/o intervention.   Psychiatric/Behavioral: Negative for agitation, behavioral problems and sleep disturbance. The patient is not nervous/anxious.     Immunization History  Administered Date(s) Administered  . Influenza Whole 12/27/2011, 11/05/2012  . Influenza, High Dose Seasonal PF 11/19/2018  . Influenza-Unspecified  11/18/2013, 11/03/2014, 11/16/2015, 11/13/2016, 11/17/2017  . Moderna SARS-COVID-2 Vaccination 02/08/2019, 03/08/2019  . PFIZER SARS-COV-2 Vaccination 02/08/2019  . PPD Test 04/04/2008, 11/23/2014  . Pneumococcal Conjugate-13 09/30/2016  . Pneumococcal Polysaccharide-23 02/05/2008, 06/03/2008  . Td 02/05/2008, 06/03/2008   Pertinent  Health Maintenance Due  Topic Date Due  . INFLUENZA VACCINE  Completed  . DEXA SCAN  Completed  . PNA vac Low Risk Adult  Completed   Fall Risk  10/09/2017 09/23/2016 08/27/2016 05/30/2015 05/16/2015  Falls in the past year? Yes Yes Yes Yes No  Comment - - [facility] Telephone Survey: data to providers prior to load - -  Number falls in past yr: 2 or more 1 1 1  -  Comment - - [facility]i Telephone Survey Actual Response = 1 - -  Injury with Fall? Yes No No Yes -  Comment broke R wrist - - - -  Risk Factor Category  - - - - -  Risk for fall due to : - - - History of fall(s);Impaired balance/gait;Impaired mobility -  Follow up - - -  Falls evaluation completed;Education provided -   Functional Status Survey:    Vitals:   04/06/19 1105  BP: 124/64  Pulse: 60  Resp: 18  Temp: 97.9 F (36.6 C)  SpO2: 96%  Weight: 141 lb 3.2 oz (64 kg)  Height: 4\' 8"  (1.422 m)   Body mass index is 31.66 kg/m. Physical Exam Vitals and nursing note reviewed.  Constitutional:      General: She is not in acute distress.    Appearance: Normal appearance. She is not ill-appearing.  HENT:     Head: Normocephalic and atraumatic.     Nose: Nose normal.     Mouth/Throat:     Mouth: Mucous membranes are moist.  Eyes:     Extraocular Movements: Extraocular movements intact.     Conjunctiva/sclera: Conjunctivae normal.     Pupils: Pupils are equal, round, and reactive to light.  Cardiovascular:     Rate and Rhythm: Normal rate and regular rhythm.     Heart sounds: No murmur.  Pulmonary:     Breath sounds: No wheezing or rales.  Abdominal:     General: Bowel sounds are normal. There  is no distension.     Palpations: Abdomen is soft.     Tenderness: There is no abdominal tenderness. There is no guarding or rebound.  Musculoskeletal:     Cervical back: Normal range of motion and neck supple.     Right lower leg: No edema.     Left lower leg: No edema.     Comments: R shoulder limited over head ROM  Skin:    General: Skin is warm and dry.  Neurological:     General: No focal deficit present.     Mental Status: She is alert. Mental status is at baseline.     Motor: No weakness.     Coordination: Coordination normal.     Gait: Gait abnormal.     Comments: Oriented to person  Psychiatric:        Mood and Affect: Mood normal.        Behavior: Behavior normal.        Thought Content: Thought content normal.     Labs reviewed: Recent Labs    01/07/19 0135 01/07/19 0605 01/08/19 0357 01/09/19 0335 03/24/19 0000  NA 141   < > 138 143 141  K 4.0   < > 4.8 4.1 4.3  CL 105   < > 105 109 108  CO2 23   < > 24 27 25*  GLUCOSE 180*  --  169* 108*  --   BUN 13   < > 26* 25* 20  CREATININE 0.99   < > 1.41* 1.05* 1.0  CALCIUM 9.5   < > 9.0 9.0 9.1   < > = values in this interval not displayed.   Recent Labs    06/17/18 0000 03/24/19 0000  AST 14 20  ALT 11 18  ALKPHOS 45 57  ALBUMIN  --  3.5   Recent Labs    01/07/19 0135 01/07/19 0135 01/07/19 0605 01/07/19 0605 01/07/19 0737 01/08/19 0357 03/24/19 0000  WBC 12.9*   < > 7.9   < > 6.9 11.2* 7.3  NEUTROABS 7.5  --   --   --   --   --  3,329  HGB 14.0   < > 13.5   < > 13.2 12.3 13.8  HCT 45.7   < > 42.3   < > 41.2 38.7 41  MCV 103.2*   < >  94.2  --  93.4 94.4  --   PLT 213   < > 259   < > 258 235 232   < > = values in this interval not displayed.   Lab Results  Component Value Date   TSH 1.20 03/24/2019   Lab Results  Component Value Date   HGBA1C 6.0 06/17/2018   Lab Results  Component Value Date   CHOL 211 (A) 02/14/2014   HDL 47 02/14/2014   LDLCALC 133 02/14/2014   TRIG 155  02/14/2014    Significant Diagnostic Results in last 30 days:  No results found.  Assessment/Plan Fainting Resolved w/o intervention, the patient is in her usual state of health, MOST: comfort measures, no IVF/ABT, continue to observe.   GERD (gastroesophageal reflux disease) Stable, continue Famotidine.   Hypothyroid Stable, TSH 1.2 03/11/19, continue Levothyroxine.   Constipation Stable, continue MiraLax   Depression, recurrent ([place]) Her mood is stable, continue Escitalopram, Mirtazapine.   PVD (peripheral vascular disease) (HCC) No edema BLE, continue Furosemide, may dc if her oral intake starts to decline.      Family/ staff Communication: plan of care reviewed with the patient and charge nurse.   Labs/tests ordered:  none  Time spend 25 minutes.

## 2019-04-06 NOTE — Assessment & Plan Note (Signed)
Resolved w/o intervention, the patient is in her usual state of health, MOST: comfort measures, no IVF/ABT, continue to observe.

## 2019-04-06 NOTE — Assessment & Plan Note (Signed)
Stable, continue Famotidine.  

## 2019-04-06 NOTE — Assessment & Plan Note (Signed)
No edema BLE, continue Furosemide, may dc if her oral intake starts to decline.

## 2019-04-06 NOTE — Assessment & Plan Note (Signed)
Her mood is stable, continue Escitalopram, Mirtazapine.  

## 2019-04-06 NOTE — Assessment & Plan Note (Addendum)
Stable, TSH 1.2 03/11/19, continue Levothyroxine.

## 2019-05-05 ENCOUNTER — Non-Acute Institutional Stay (SKILLED_NURSING_FACILITY): Payer: Medicare Other | Admitting: Internal Medicine

## 2019-05-05 ENCOUNTER — Encounter: Payer: Self-pay | Admitting: Internal Medicine

## 2019-05-05 DIAGNOSIS — J411 Mucopurulent chronic bronchitis: Secondary | ICD-10-CM

## 2019-05-05 DIAGNOSIS — E039 Hypothyroidism, unspecified: Secondary | ICD-10-CM

## 2019-05-05 DIAGNOSIS — K219 Gastro-esophageal reflux disease without esophagitis: Secondary | ICD-10-CM

## 2019-05-05 DIAGNOSIS — F339 Major depressive disorder, recurrent, unspecified: Secondary | ICD-10-CM

## 2019-05-05 DIAGNOSIS — R55 Syncope and collapse: Secondary | ICD-10-CM

## 2019-05-05 DIAGNOSIS — M810 Age-related osteoporosis without current pathological fracture: Secondary | ICD-10-CM

## 2019-05-05 NOTE — Progress Notes (Signed)
Location:   [facility] Room Number: 25 Place of Service:  SNF (31) Provider: Veleta Miners MD  Mast, Man X, NP  Patient Care Team: Mast, Man X, NP as PCP - General (Internal Medicine) Newton Pigg, MD as Consulting Physician (Obstetrics and Gynecology) Monna Fam, MD as Consulting Physician (Ophthalmology) Melida Quitter, MD as Consulting Physician (Otolaryngology) Myrlene Broker, MD as Consulting Physician (Urology) [place], [facility] Virgie Dad, MD as Consulting Physician (Internal Medicine)  Extended Emergency Contact Information Primary Emergency Contact: Vanzee,[name] Address: [place], [place] Phone: [phone] Work Phone: [phone] Mobile Phone: [phone] Relation: Son Secondary Emergency Contact: Teofilo [place] Phone: [phone] Work Phone: [phone] Relation: Friend  Code Status:  DNR Goals of care: Advanced Directive information Advanced Directives 05/05/2019  Does Patient Have a Medical Advance Directive? Yes  Type of Advance Directive Living will;Out of facility DNR (pink MOST or yellow form);Healthcare Power of Attorney  Does patient want to make changes to medical advance directive? No - Patient declined  Copy of [place] in Chart? Yes - validated most recent copy scanned in chart (See row information)  Pre-existing out of facility DNR order (yellow form or pink MOST form) Yellow form placed in chart (order not valid for inpatient use);Pink MOST form placed in chart (order not valid for inpatient use)     Chief Complaint  Patient presents with  . Medical Management of Chronic Issues  . Health Maintenance    TDAP    HPI:  Pt is a 86 y.o. female seen today for medical management of chronic diseases.    Patient is a long-term care resident Patient has h/o Sinus Bradycardia, LE edema, Hypothyroidism,  Depression, Chronic Sinusitis, H/o Hip Fracture,And Proximal Humerus Fracture due to Fall with Delayed healing  Has been doing well.  It seems like she had an episode of syncope few weeks ago which resolved itself.  No aggressive work-up due to her age and frailty. Per nurses she has not had any more episodes.  Her mental status is at her baseline.  She does answer appropriately.  But has had worsening cognition Her weight is down by 3-4 lbs No other acute issues  Past Medical History:  Diagnosis Date  . Asthma   . Carpal tunnel syndrome 03/07/2009  . Disturbance of skin sensation 02/21/2009  . Diverticulosis of colon (without mention of hemorrhage) 02/16/2009  . Fracture of upper end of left humerus 11/18/14  . 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 Hypothyroid   . Osteoarthrosis, unspecified whether generalized or localized, unspecified site 02/16/2009  . Other abnormal blood chemistry 06/19/2010   hyperglycemia  . Other and unspecified hyperlipidemia 10/24/2009  . Other atopic dermatitis and related conditions 02/16/2009  . Otosclerosis, unspecified 02/22/2003  . Pain in limb 12/05/2009  . Senile osteoporosis 06/16/2010  . Sensorineural hearing loss, unspecified 02/21/2009  . Syncope and collapse 05/30/2009  . Unspecified nasal polyp 02/21/2009  . Unspecified urinary incontinence 02/21/2009   Past Surgical History:  Procedure Laterality Date  . ABDOMINAL HYSTERECTOMY  1999   Dr. Collier Bullock  . BREAST BIOPSY  07/16/1990   benign left breast needle biopsy  Dr. Margot Chimes  . CARPAL TUNNEL RELEASE  04/07/2009   right Dr. Daylene Katayama  . CATARACT EXTRACTION W/ INTRAOCULAR LENS  IMPLANT, BILATERAL Bilateral   . INTRAMEDULLARY (IM) NAIL INTERTROCHANTERIC Right  09/17/2017   Procedure: INTRAMEDULLARY (IM) NAIL INTERTROCHANTRIC HIP;  Surgeon: Nicholes Stairs, MD;  Location: [facility];  Service: Orthopedics;  Laterality: Right;  . OPEN REDUCTION INTERNAL FIXATION (ORIF) DISTAL RADIAL FRACTURE Right 09/17/2017    Procedure: OPEN REDUCTION INTERNAL FIXATION (ORIF) DISTAL RADIAL FRACTURE;  Surgeon: Iran Planas, MD;  Location: [facility];  Service: Orthopedics;  Laterality: Right;  . TRIGGER FINGER RELEASE Left 08/09/2014   Procedure: RELEASE TRIGGER FINGER/A-1 PULLEY LEFT MIDDLE FINGER;  Surgeon: Daryll Brod, MD;  Location: [facility];  Service: Orthopedics;  Laterality: Left;  REGIONAL/FAB    Allergies  Allergen Reactions  . Sulfa Antibiotics Itching, Rash and Other (See Comments)    "Allergic," per MAR  . Amoxicillin Other (See Comments)    Per [facility] 09/15/17 Has patient had a PCN reaction causing immediate rash, facial/tongue/throat swelling, SOB or lightheadedness with hypotension: Unknown Has patient had a PCN reaction causing severe rash involving mucus membranes or skin necrosis: Unknown Has patient had a PCN reaction that required hospitalization: Unknown Has patient had a PCN reaction occurring within the last 10 years: Unknown If all of the above answers are "NO", then may proceed with Cephalosporin use.  . Avelox [Moxifloxacin Hcl In Nacl] Itching  . Doxycycline Other (See Comments)    "Allergic," per MAR  . Hista-Tabs [Triprolidine-Pse] Other (See Comments)    "Passed out"  . Pyrilamine-Phenylephrine Other (See Comments)    "Allergic," per [facility] 09/15/17  . Septra [Sulfamethoxazole-Trimethoprim] Itching, Rash and Other (See Comments)    "Allergic," per MAR    Allergies as of 05/05/2019      Reactions   Sulfa Antibiotics Itching, Rash, Other (See Comments)   "Allergic," per MAR   Amoxicillin Other (See Comments)   Per [facility] 09/15/17 Has patient had a PCN reaction causing immediate rash, facial/tongue/throat swelling, SOB or lightheadedness with hypotension: Unknown Has patient had a PCN reaction causing severe rash involving mucus membranes or skin necrosis: Unknown Has patient had a PCN reaction that required hospitalization: Unknown Has patient had a PCN reaction occurring within  the last 10 years: Unknown If all of the above answers are "NO", then may proceed with Cephalosporin use.   Avelox [moxifloxacin Hcl In Nacl] Itching   Doxycycline Other (See Comments)   "Allergic," per MAR   Hista-tabs [triprolidine-pse] Other (See Comments)   "Passed out"   Pyrilamine-phenylephrine Other (See Comments)   "Allergic," per [facility] 09/15/17   Septra [sulfamethoxazole-trimethoprim] Itching, Rash, Other (See Comments)   "Allergic," per [facility]      Medication List       Accurate as of May 05, 2019 11:03 AM. If you have any questions, ask your nurse or doctor.        acetaminophen 325 MG tablet Commonly known as: TYLENOL Take 650 mg by mouth every 6 (six) hours as needed (for pain).   aspirin EC 81 MG tablet Take 81 mg by mouth See admin instructions. Take 81 mg by mouth in the morning on Tuesday and Saturday. 7am-11am   benzonatate 100 MG capsule Commonly known as: TESSALON Take by mouth 3 (three) times daily as needed for cough.   chlorhexidine 0.12 % solution Commonly known as: PERIDEX Use as directed 15 mLs in the mouth or throat daily.   escitalopram 20 MG tablet Commonly known as: LEXAPRO Take 20 mg by mouth daily. In the morning 7am-11am.   famotidine 20 MG tablet Commonly known as: PEPCID Take 20 mg by mouth daily. In the morning 7am-11am.  Fluticasone-Salmeterol 250-50 MCG/DOSE Aepb Commonly known as: ADVAIR Inhale 1 puff into the lungs 2 (two) times daily. Morning: 7am-11am Evening: 4pm-7pm.  Rinse mouth after use   furosemide 20 MG tablet Commonly known as: LASIX Take 20 mg by mouth See admin instructions. Take 20 mg by mouth in the morning between 7am-11am and hold for SBP < 110   lactose free nutrition Liqd Take 237 mLs by mouth See admin instructions. Add 237 ml's to ice cream and consume two times a day. 10am & 4pm.   levothyroxine 25 MCG tablet Commonly known as: SYNTHROID Take 25 mcg by mouth daily before breakfast. 7:30am.     mirtazapine 15 MG tablet Commonly known as: REMERON Take 15 mg by mouth daily. 7:30pm-11pm.   olopatadine 0.1 % ophthalmic solution Commonly known as: PATANOL 1 drop 2 (two) times daily.   OXYGEN Inhale 2 L/min into the lungs as needed (to keep O2 SATS >90%).   polyethylene glycol 17 g packet Commonly known as: MIRALAX / GLYCOLAX Take 17 g by mouth daily. Mix with 6-8 ounces of fluid of choice. Once daily 7am-11:00am.   polyvinyl alcohol 1.4 % ophthalmic solution Commonly known as: LIQUIFILM TEARS Place 1 drop into both eyes 3 (three) times daily.   PreviDent 1.1 % Gel dental gel Generic drug: sodium fluoride Place 1 application onto teeth at bedtime.   raloxifene 60 MG tablet Commonly known as: EVISTA Take 60 mg by mouth daily. 4pm-7pm.   Ventolin HFA 108 (90 Base) MCG/ACT inhaler Generic drug: albuterol Inhale 2 puffs into the lungs every 6 (six) hours as needed for shortness of breath (dyspnea).   zinc oxide 20 % ointment Apply to buttocks as needed every shift after incontinent episodes       Review of Systems  Immunization History  Administered Date(s) Administered  . Influenza Whole 12/27/2011, 11/05/2012  . Influenza, High Dose Seasonal PF 11/19/2018  . Influenza-Unspecified 11/18/2013, 11/03/2014, 11/16/2015, 11/13/2016, 11/17/2017  . Moderna SARS-COVID-2 Vaccination 02/08/2019, 03/08/2019  . PFIZER SARS-COV-2 Vaccination 02/08/2019  . PPD Test 04/04/2008, 11/23/2014  . Pneumococcal Conjugate-13 09/30/2016  . Pneumococcal Polysaccharide-23 02/05/2008, 06/03/2008  . Td 02/05/2008, 06/03/2008   Pertinent  Health Maintenance Due  Topic Date Due  . INFLUENZA VACCINE  Completed  . DEXA SCAN  Completed  . PNA vac Low Risk Adult  Completed   Fall Risk  10/09/2017 09/23/2016 08/27/2016 05/30/2015 05/16/2015  Falls in the past year? Yes Yes Yes Yes No  Comment - - [facility] Telephone Survey: data to providers prior to load - -  Number falls in past yr: 2 or more 1 1  1  -  Comment - - [facility] Telephone Survey Actual Response = 1 - -  Injury with Fall? Yes No No Yes -  Comment broke R wrist - - - -  Risk Factor Category  - - - - -  Risk for fall due to : - - - History of fall(s);Impaired balance/gait;Impaired mobility -  Follow up - - - Falls evaluation completed;Education provided -   Functional Status Survey:    Vitals:   05/05/19 1056  BP: (!) 126/57  Pulse: 69  Resp: 18  Temp: (!) 97.3 F (36.3 C)  SpO2: 95%  Weight: 138 lb 3.2 oz (62.7 kg)  Height: 4\' 8"  (1.422 m)   Body mass index is 30.98 kg/m. Physical Exam  Constitutional: . Well-developed and well-nourished.  HENT:  Head: Normocephalic.  Mouth/Throat: Oropharynx is clear and moist.  Eyes: Pupils are equal, round,  and reactive to light.  Neck: Neck supple.  Cardiovascular: Normal rate and normal heart sounds.  No murmur heard. Pulmonary/Chest: Effort normal and breath sounds normal. No respiratory distress. No wheezes. She has no rales.  Abdominal: Soft. Bowel sounds are normal. No distension. There is no tenderness. There is no rebound.  Musculoskeletal: No edema.  Lymphadenopathy: none Neurological: No Focal Deficits Follows Commands. Wheelchair Dependent   Skin: Skin is warm and dry.  Psychiatric: Normal mood and affect. Behavior is normal. Thought content normal.    Labs reviewed: Recent Labs    01/07/19 0135 01/07/19 0605 01/08/19 0357 01/09/19 0335 03/24/19 0000  NA 141   < > 138 143 141  K 4.0   < > 4.8 4.1 4.3  CL 105   < > 105 109 108  CO2 23   < > 24 27 25*  GLUCOSE 180*  --  169* 108*  --   BUN 13   < > 26* 25* 20  CREATININE 0.99   < > 1.41* 1.05* 1.0  CALCIUM 9.5   < > 9.0 9.0 9.1   < > = values in this interval not displayed.   Recent Labs    06/17/18 0000 03/24/19 0000  AST 14 20  ALT 11 18  ALKPHOS 45 57  ALBUMIN  --  3.5   Recent Labs    01/07/19 0135 01/07/19 0135 01/07/19 0605 01/07/19 0605 01/07/19 0737 01/08/19 0357  03/24/19 0000  WBC 12.9*   < > 7.9   < > 6.9 11.2* 7.3  NEUTROABS 7.5  --   --   --   --   --  3,329  HGB 14.0   < > 13.5   < > 13.2 12.3 13.8  HCT 45.7   < > 42.3   < > 41.2 38.7 41  MCV 103.2*   < > 94.2  --  93.4 94.4  --   PLT 213   < > 259   < > 258 235 232   < > = values in this interval not displayed.   Lab Results  Component Value Date   TSH 1.20 03/24/2019   Lab Results  Component Value Date   HGBA1C 6.0 06/17/2018   Lab Results  Component Value Date   CHOL 211 (A) 02/14/2014   HDL 47 02/14/2014   LDLCALC 133 02/14/2014   TRIG 155 02/14/2014    Significant Diagnostic Results in last 30 days:  No results found.  Assessment/Plan  Mucopurulent chronic bronchitis (HCC) Continue on Advair  Syncope, unspecified syncope type No More Episode Family does not want aggressive work up Acquired hypothyroidism TSH Normal in 03/24/2019 Gastroesophageal reflux disease without esophagitis Continue on Pepcid Depression, recurrent (HCC) Stable on Remeron and Lexapro Age-related osteoporosis without current pathological fracture On Evista LE edema On low dose of Lasix BUN and Creat Normal  Family/ staff Communication:   Labs/tests ordered:

## 2019-05-10 ENCOUNTER — Telehealth: Payer: Self-pay | Admitting: Internal Medicine

## 2019-05-10 ENCOUNTER — Non-Acute Institutional Stay (SKILLED_NURSING_FACILITY): Payer: Medicare Other | Admitting: Nurse Practitioner

## 2019-05-10 ENCOUNTER — Encounter: Payer: Self-pay | Admitting: Nurse Practitioner

## 2019-05-10 DIAGNOSIS — I739 Peripheral vascular disease, unspecified: Secondary | ICD-10-CM | POA: Diagnosis not present

## 2019-05-10 DIAGNOSIS — F339 Major depressive disorder, recurrent, unspecified: Secondary | ICD-10-CM | POA: Diagnosis not present

## 2019-05-10 DIAGNOSIS — R001 Bradycardia, unspecified: Secondary | ICD-10-CM | POA: Diagnosis not present

## 2019-05-10 DIAGNOSIS — E039 Hypothyroidism, unspecified: Secondary | ICD-10-CM | POA: Diagnosis not present

## 2019-05-10 LAB — TSH: TSH: 0.82 (ref 0.41–5.90)

## 2019-05-10 NOTE — Progress Notes (Signed)
Location:   [facility] Room Number: 25 Place of Service:  SNF (31) Provider:  Lister Brizzi X, NP  Corona Popovich X, NP  Patient Care Team: Lakeyn Dokken X, NP as PCP - General (Internal Medicine) Newton Pigg, MD as Consulting Physician (Obstetrics and Gynecology) Monna Fam, MD as Consulting Physician (Ophthalmology) Melida Quitter, MD as Consulting Physician (Otolaryngology) Myrlene Broker, MD as Consulting Physician (Urology) [place], [facility] Virgie Dad, MD as Consulting Physician (Internal Medicine)  Extended Emergency Contact Information Primary Emergency Contact: Haliburton,[name] Address: [place], [place] Phone: [phone] Work Phone: [phone] Mobile Phone: [phone] Relation: Son Secondary Emergency Contact: Teofilo [place] Phone: [phone] Work Phone: [phone] Relation: Friend  Code Status:  DNR Goals of care: Advanced Directive information Advanced Directives 05/10/2019  Does Patient Have a Medical Advance Directive? Yes  Type of Paramedic of [place];Out of facility DNR (pink MOST or yellow form);Living will  Does patient want to make changes to medical advance directive? No - Patient declined  Copy of [place] in Chart? Yes - validated most recent copy scanned in chart (See row information)  Pre-existing out of facility DNR order (yellow form or pink MOST form) Yellow form placed in chart (order not valid for inpatient use);Pink MOST form placed in chart (order not valid for inpatient use)     Chief Complaint  Patient presents with  . Acute Visit    Slow heart beats    HPI:  Pt is a 86 y.o. female seen today for an acute visit for Hx of bradycardia, reported 05/08/19 sleepy, not eating breakfast, HR in 40s, no syncope, Hx of Bradycardia, usually HR 50s. Hx of Hypothyroidism, on Levothyroxine  71mcg qd. Chronic edema BLE, compensated, on Furosemide 20mg  qd, hold for Sbp<110. Her mood is stable on Mirtazapine 15mg  qd, Escitalopram 20mg  qd.    Past Medical History:  Diagnosis Date  . Asthma   . Carpal tunnel syndrome 03/07/2009  . Disturbance of skin sensation 02/21/2009  . Diverticulosis of colon (without mention of hemorrhage) 02/16/2009  . Fracture of upper end of left humerus 11/18/14  . 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 Hypothyroid   . Osteoarthrosis, unspecified whether generalized or localized, unspecified site 02/16/2009  . Other abnormal blood chemistry 06/19/2010   hyperglycemia  . Other and unspecified hyperlipidemia 10/24/2009  . Other atopic dermatitis and related conditions 02/16/2009  . Otosclerosis, unspecified 02/22/2003  . Pain in limb 12/05/2009  . Senile osteoporosis 06/16/2010  . Sensorineural hearing loss, unspecified 02/21/2009  . Syncope and collapse 05/30/2009  . Unspecified nasal polyp 02/21/2009  . Unspecified urinary incontinence 02/21/2009   Past Surgical History:  Procedure Laterality Date  . ABDOMINAL HYSTERECTOMY  1999   Dr. Collier Bullock  . BREAST BIOPSY  07/16/1990   benign left breast needle biopsy  Dr. Margot Chimes  . CARPAL TUNNEL RELEASE  04/07/2009   right Dr. Daylene Katayama  . CATARACT EXTRACTION W/ INTRAOCULAR LENS  IMPLANT, BILATERAL Bilateral   . INTRAMEDULLARY (IM) NAIL INTERTROCHANTERIC Right 09/17/2017   Procedure: INTRAMEDULLARY (IM) NAIL INTERTROCHANTRIC HIP;  Surgeon: Nicholes Stairs, MD;  Location: [facility];  Service: Orthopedics;  Laterality: Right;  . OPEN REDUCTION INTERNAL FIXATION (ORIF) DISTAL RADIAL FRACTURE Right 09/17/2017   Procedure: OPEN REDUCTION INTERNAL FIXATION (ORIF) DISTAL RADIAL FRACTURE;  Surgeon: Iran Planas, MD;  Location:  [facility] OR;  Service: Orthopedics;  Laterality: Right;  . TRIGGER FINGER RELEASE Left 08/09/2014   Procedure: RELEASE TRIGGER FINGER/A-1 PULLEY LEFT MIDDLE FINGER;  Surgeon: Daryll Brod, MD;  Location: [facility];  Service: Orthopedics;  Laterality: Left;  REGIONAL/FAB    Allergies  Allergen Reactions  . Sulfa Antibiotics Itching, Rash and Other (See Comments)    "Allergic," per MAR  . Amoxicillin Other (See Comments)    Per [facility] 09/15/17 Has patient had a PCN reaction causing immediate rash, facial/tongue/throat swelling, SOB or lightheadedness with hypotension: Unknown Has patient had a PCN reaction causing severe rash involving mucus membranes or skin necrosis: Unknown Has patient had a PCN reaction that required hospitalization: Unknown Has patient had a PCN reaction occurring within the last 10 years: Unknown If all of the above answers are "NO", then may proceed with Cephalosporin use.  . Avelox [Moxifloxacin Hcl In Nacl] Itching  . Doxycycline Other (See Comments)    "Allergic," per MAR  . Hista-Tabs [Triprolidine-Pse] Other (See Comments)    "Passed out"  . Pyrilamine-Phenylephrine Other (See Comments)    "Allergic," per [facility] 09/15/17  . Septra [Sulfamethoxazole-Trimethoprim] Itching, Rash and Other (See Comments)    "Allergic," per MAR    Allergies as of 05/10/2019      Reactions   Sulfa Antibiotics Itching, Rash, Other (See Comments)   "Allergic," per MAR   Amoxicillin Other (See Comments)   Per [facility] 09/15/17 Has patient had a PCN reaction causing immediate rash, facial/tongue/throat swelling, SOB or lightheadedness with hypotension: Unknown Has patient had a PCN reaction causing severe rash involving mucus membranes or skin necrosis: Unknown Has patient had a PCN reaction that required hospitalization: Unknown Has patient had a PCN reaction occurring within the last 10 years: Unknown If all of the above answers are "NO", then may proceed with Cephalosporin use.   Avelox [moxifloxacin Hcl In Nacl] Itching   Doxycycline Other (See Comments)   "Allergic," per MAR   Hista-tabs [triprolidine-pse] Other (See Comments)   "Passed out"   Pyrilamine-phenylephrine Other (See  Comments)   "Allergic," per [facility] 09/15/17   Septra [sulfamethoxazole-trimethoprim] Itching, Rash, Other (See Comments)   "Allergic," per [facility]      Medication List       Accurate as of May 10, 2019 11:59 PM. If you have any questions, ask your nurse or doctor.        acetaminophen 325 MG tablet Commonly known as: TYLENOL Take 650 mg by mouth every 6 (six) hours as needed (for pain).   aspirin EC 81 MG tablet Take 81 mg by mouth See admin instructions. Take 81 mg by mouth in the morning on Tuesday and Saturday. 7am-11am   benzonatate 100 MG capsule Commonly known as: TESSALON Take by mouth 3 (three) times daily as needed for cough.   chlorhexidine 0.12 % solution Commonly known as: PERIDEX Use as directed 15 mLs in the mouth or throat daily.   escitalopram 20 MG tablet Commonly known as: LEXAPRO Take 20 mg by mouth daily. In the morning 7am-11am.   famotidine 20 MG tablet Commonly known as: PEPCID Take 20 mg by mouth daily. In the morning 7am-11am.   Fluticasone-Salmeterol 250-50 MCG/DOSE Aepb Commonly known as: ADVAIR Inhale 1 puff into the lungs 2 (two) times daily. Morning: 7am-11am Evening: 4pm-7pm.  Rinse mouth after use   furosemide 20 MG tablet Commonly known as: LASIX Take 20 mg by mouth See admin instructions. Take 20 mg by mouth in the morning  between 7am-11am and hold for SBP < 110   lactose free nutrition Liqd Take 237 mLs by mouth See admin instructions. Add 237 ml's to ice cream and consume two times a day. 10am & 4pm.   levothyroxine 25 MCG tablet Commonly known as: SYNTHROID Take 25 mcg by mouth daily before breakfast. 7:30am.   mirtazapine 15 MG tablet Commonly known as: REMERON Take 15 mg by mouth daily. 7:30pm-11pm.   olopatadine 0.1 % ophthalmic solution Commonly known as: PATANOL 1 drop 2 (two) times daily.   OXYGEN Inhale 2 L/min into the lungs as needed (to keep O2 SATS >90%).   polyethylene glycol 17 g packet Commonly known as:  MIRALAX / GLYCOLAX Take 17 g by mouth daily. Mix with 6-8 ounces of fluid of choice. Once daily 7am-11:00am.   polyvinyl alcohol 1.4 % ophthalmic solution Commonly known as: LIQUIFILM TEARS Place 1 drop into both eyes 3 (three) times daily.   PreviDent 1.1 % Gel dental gel Generic drug: sodium fluoride Place 1 application onto teeth at bedtime.   raloxifene 60 MG tablet Commonly known as: EVISTA Take 60 mg by mouth daily. 4pm-7pm.   Ventolin HFA 108 (90 Base) MCG/ACT inhaler Generic drug: albuterol Inhale 2 puffs into the lungs every 6 (six) hours as needed for shortness of breath (dyspnea).   zinc oxide 20 % ointment Apply to buttocks as needed every shift after incontinent episodes       Review of Systems  Constitutional: Negative for activity change, appetite change, fatigue and fever.  HENT: Positive for hearing loss. Negative for congestion and voice change.   Eyes: Negative for visual disturbance.  Respiratory: Positive for shortness of breath. Negative for cough and wheezing.        DOE  Cardiovascular: Positive for leg swelling. Negative for chest pain and palpitations.  Gastrointestinal: Negative for abdominal distention, constipation and vomiting.  Genitourinary: Negative for difficulty urinating, dysuria and urgency.  Musculoskeletal: Positive for arthralgias and gait problem.  Skin: Negative for color change.  Neurological: Negative for dizziness, syncope, speech difficulty, weakness, light-headedness and headaches.       Memory lapses. Fainting episode last night, resolved w/o intervention.   Psychiatric/Behavioral: Negative for agitation, behavioral problems and sleep disturbance.    Immunization History  Administered Date(s) Administered  . Influenza Whole 12/27/2011, 11/05/2012  . Influenza, High Dose Seasonal PF 11/19/2018  . Influenza-Unspecified 11/18/2013, 11/03/2014, 11/16/2015, 11/13/2016, 11/17/2017  . Moderna SARS-COVID-2 Vaccination 02/08/2019,  03/08/2019  . PFIZER SARS-COV-2 Vaccination 02/08/2019  . PPD Test 04/04/2008, 11/23/2014  . Pneumococcal Conjugate-13 09/30/2016  . Pneumococcal Polysaccharide-23 02/05/2008, 06/03/2008  . Td 02/05/2008, 06/03/2008   Pertinent  Health Maintenance Due  Topic Date Due  . INFLUENZA VACCINE  09/05/2019  . DEXA SCAN  Completed  . PNA vac Low Risk Adult  Completed   Fall Risk  10/09/2017 09/23/2016 08/27/2016 05/30/2015 05/16/2015  Falls in the past year? Yes Yes Yes Yes No  Comment - - Emmi Telephone Survey: data to providers prior to load - -  Number falls in past yr: 2 or more 1 1 1  -  Comment - - [facility]i Telephone Survey Actual Response = 1 - -  Injury with Fall? Yes No No Yes -  Comment broke R wrist - - - -  Risk Factor Category  - - - - -  Risk for fall due to : - - - History of fall(s);Impaired balance/gait;Impaired mobility -  Follow up - - - Falls evaluation completed;Education provided -  Functional Status Survey:    Vitals:   05/10/19 0902  BP: 132/68  Pulse: (!) 59  Resp: 18  Temp: (!) 97.3 F (36.3 C)  SpO2: 95%  Weight: 136 lb 6.4 oz (61.9 kg)  Height: 4\' 8"  (1.422 m)   Body mass index is 30.58 kg/m. Physical Exam Vitals and nursing note reviewed.  Constitutional:      Appearance: Normal appearance.  HENT:     Head: Normocephalic and atraumatic.     Mouth/Throat:     Mouth: Mucous membranes are moist.  Eyes:     Extraocular Movements: Extraocular movements intact.     Conjunctiva/sclera: Conjunctivae normal.     Pupils: Pupils are equal, round, and reactive to light.  Cardiovascular:     Rate and Rhythm: Regular rhythm. Bradycardia present.     Heart sounds: No murmur.  Pulmonary:     Breath sounds: No wheezing or rales.  Abdominal:     General: Bowel sounds are normal. There is no distension.     Palpations: Abdomen is soft.     Tenderness: There is no abdominal tenderness.  Musculoskeletal:     Cervical back: Normal range of motion and neck supple.      Right lower leg: Edema present.     Left lower leg: Edema present.     Comments: R shoulder limited over head ROM. Trace edema BLE  Skin:    General: Skin is warm and dry.  Neurological:     General: No focal deficit present.     Mental Status: She is alert. Mental status is at baseline.     Motor: No weakness.     Coordination: Coordination normal.     Gait: Gait abnormal.     Comments: Oriented to person  Psychiatric:        Mood and Affect: Mood normal.        Behavior: Behavior normal.        Thought Content: Thought content normal.     Labs reviewed: Recent Labs    01/07/19 0135 01/07/19 0605 01/08/19 0357 01/09/19 0335 03/24/19 0000  NA 141   < > 138 143 141  K 4.0   < > 4.8 4.1 4.3  CL 105   < > 105 109 108  CO2 23   < > 24 27 25*  GLUCOSE 180*  --  169* 108*  --   BUN 13   < > 26* 25* 20  CREATININE 0.99   < > 1.41* 1.05* 1.0  CALCIUM 9.5   < > 9.0 9.0 9.1   < > = values in this interval not displayed.   Recent Labs    06/17/18 0000 03/24/19 0000  AST 14 20  ALT 11 18  ALKPHOS 45 57  ALBUMIN  --  3.5   Recent Labs    01/07/19 0135 01/07/19 0135 01/07/19 0605 01/07/19 0605 01/07/19 0737 01/08/19 0357 03/24/19 0000  WBC 12.9*   < > 7.9   < > 6.9 11.2* 7.3  NEUTROABS 7.5  --   --   --   --   --  3,329  HGB 14.0   < > 13.5   < > 13.2 12.3 13.8  HCT 45.7   < > 42.3   < > 41.2 38.7 41  MCV 103.2*   < > 94.2  --  93.4 94.4  --   PLT 213   < > 259   < > 258 235 232   < > =  values in this interval not displayed.   Lab Results  Component Value Date   TSH 1.20 03/24/2019   Lab Results  Component Value Date   HGBA1C 6.0 06/17/2018   Lab Results  Component Value Date   CHOL 211 (A) 02/14/2014   HDL 47 02/14/2014   LDLCALC 133 02/14/2014   TRIG 155 02/14/2014    Significant Diagnostic Results in last 30 days:  No results found.  Assessment/Plan Sinus bradycardia HR in 50s today upon my examination, pending TSH, may consider EKG if  persists/symptomatic.  05/10/19 TSH 0.82  Hypothyroid 05/10/19 TSH 0.82, 1.2 03/11/19, continue Levothyroxine.   PVD (peripheral vascular disease) (HCC) Trace edema BLE, continue Furosemide 20mg  qd, hold for Sbp<110  Depression, recurrent ([place]) Her mood is stable, continue Mirtazapine, Escitalopram.     Family/ staff Communication: plan of care reviewed with the patient and charge nurse.   Labs/tests ordered:  Pending TSH  Time spend 25 minutes.

## 2019-05-10 NOTE — Telephone Encounter (Signed)
See reason for call. Bradycardia--advised to observe and did order TSH for next draw as not done since 9/20 and could be a cause.  Not on meds that would cause otherwise.   If not improving, needs EKG and further eval.  Evalisse Prajapati L. Laksh Hinners, D.O. [facility] [address], [place] [address] Cell Phone (Mon-Fri 8am-5pm):  [phone] On Call:  [phone] & follow prompts after 5pm & weekends Office Phone:  [phone] Office Fax:  [phone]

## 2019-05-10 NOTE — Assessment & Plan Note (Addendum)
HR in 50s today upon my examination, pending TSH, may consider EKG if persists/symptomatic.  05/10/19 TSH 0.82

## 2019-05-11 ENCOUNTER — Encounter: Payer: Self-pay | Admitting: Nurse Practitioner

## 2019-05-11 NOTE — Assessment & Plan Note (Signed)
05/10/19 TSH 0.82, 1.2 03/11/19, continue Levothyroxine.

## 2019-05-11 NOTE — Assessment & Plan Note (Signed)
Trace edema BLE, continue Furosemide 20mg  qd, hold for Sbp<110

## 2019-05-11 NOTE — Assessment & Plan Note (Signed)
Her mood is stable, continue Mirtazapine, Escitalopram.

## 2019-05-31 ENCOUNTER — Non-Acute Institutional Stay (SKILLED_NURSING_FACILITY): Payer: Medicare Other | Admitting: Nurse Practitioner

## 2019-05-31 ENCOUNTER — Encounter: Payer: Self-pay | Admitting: Nurse Practitioner

## 2019-05-31 DIAGNOSIS — J42 Unspecified chronic bronchitis: Secondary | ICD-10-CM

## 2019-05-31 DIAGNOSIS — K5900 Constipation, unspecified: Secondary | ICD-10-CM

## 2019-05-31 DIAGNOSIS — K219 Gastro-esophageal reflux disease without esophagitis: Secondary | ICD-10-CM

## 2019-05-31 DIAGNOSIS — I739 Peripheral vascular disease, unspecified: Secondary | ICD-10-CM | POA: Diagnosis not present

## 2019-05-31 DIAGNOSIS — E039 Hypothyroidism, unspecified: Secondary | ICD-10-CM

## 2019-05-31 DIAGNOSIS — F339 Major depressive disorder, recurrent, unspecified: Secondary | ICD-10-CM

## 2019-05-31 NOTE — Progress Notes (Signed)
Location:    [facility] Room Number: 25 Place of Service:  SNF (31) Provider:  Marlana Latus NP   Dequann Vandervelden X, NP  Patient Care Team: Megan Presti X, NP as PCP - General (Internal Medicine) Newton Pigg, MD as Consulting Physician (Obstetrics and Gynecology) Monna Fam, MD as Consulting Physician (Ophthalmology) Melida Quitter, MD as Consulting Physician (Otolaryngology) Myrlene Broker, MD as Consulting Physician (Urology) [place], [facility] Virgie Dad, MD as Consulting Physician (Internal Medicine)  Extended Emergency Contact Information Primary Emergency Contact: Mcconaughey,[name] Address: [place], [place] Phone: [phone] Work Phone: [phone] Mobile Phone: [phone] Relation: Son Secondary Emergency Contact: Teofilo [place] Phone: [phone] Work Phone: [phone] Relation: Friend  Code Status:  DNR Goals of care: Advanced Directive information Advanced Directives 05/31/2019  Does Patient Have a Medical Advance Directive? Yes  Type of Paramedic of [place];Out of facility DNR (pink MOST or yellow form);Living will  Does patient want to make changes to medical advance directive? No - Patient declined  Copy of [place] in Chart? Yes - validated most recent copy scanned in chart (See row information)  Pre-existing out of facility DNR order (yellow form or pink MOST form) Yellow form placed in chart (order not valid for inpatient use);Pink MOST form placed in chart (order not valid for inpatient use)     Chief Complaint  Patient presents with  . Medical Management of Chronic Issues  . Health Maintenance    TDAP    HPI:  Pt is a 86 y.o. female seen today for medical management of chronic diseases.    The patient resides in SNF [facility] for safety, care assistance, w/c for mobility, her mood is stable,  on Escitalopram 20mg  qd, Mirtazapine 15mg  qd. GERD, stable, on Famotidine 20mg  qd. Asthma/COPD, stable, on Advair bid. Chronic edema BLE, stable, on Furosemide 20mg  qd. Hypothyroidism, stable, on Levothyroxine 71mcg qd. Constipation, stable, on MiraLax qd.    Past Medical History:  Diagnosis Date  . Asthma   . Carpal tunnel syndrome 03/07/2009  . Disturbance of skin sensation 02/21/2009  . Diverticulosis of colon (without mention of hemorrhage) 02/16/2009  . Fracture of upper end of left humerus 11/18/14  . 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 Hypothyroid   . Osteoarthrosis, unspecified whether generalized or localized, unspecified site 02/16/2009  . Other abnormal blood chemistry 06/19/2010   hyperglycemia  . Other and unspecified hyperlipidemia 10/24/2009  . Other atopic dermatitis and related conditions 02/16/2009  . Otosclerosis, unspecified 02/22/2003  . Pain in limb 12/05/2009  . Senile osteoporosis 06/16/2010  . Sensorineural hearing loss, unspecified 02/21/2009  . Syncope and collapse 05/30/2009  . Unspecified nasal polyp 02/21/2009  . Unspecified urinary incontinence 02/21/2009   Past Surgical History:  Procedure Laterality Date  . ABDOMINAL HYSTERECTOMY  1999   Dr. Collier Bullock  . BREAST BIOPSY  07/16/1990   benign left breast needle biopsy  Dr. Margot Chimes  . CARPAL TUNNEL RELEASE  04/07/2009   right Dr. Daylene Katayama  . CATARACT EXTRACTION W/ INTRAOCULAR LENS  IMPLANT, BILATERAL Bilateral   . INTRAMEDULLARY (IM) NAIL INTERTROCHANTERIC Right 09/17/2017   Procedure: INTRAMEDULLARY (IM) NAIL INTERTROCHANTRIC HIP;  Surgeon: Nicholes Stairs, MD;  Location: [place];  Service: Orthopedics;  Laterality: Right;  . OPEN REDUCTION INTERNAL FIXATION (ORIF) DISTAL RADIAL FRACTURE Right 09/17/2017   Procedure:  OPEN REDUCTION INTERNAL FIXATION (ORIF) DISTAL RADIAL FRACTURE;  Surgeon: Iran Planas, MD;  Location: [place];  Service: Orthopedics;  Laterality: Right;  . TRIGGER FINGER RELEASE Left 08/09/2014    Procedure: RELEASE TRIGGER FINGER/A-1 PULLEY LEFT MIDDLE FINGER;  Surgeon: Daryll Brod, MD;  Location: [place];  Service: Orthopedics;  Laterality: Left;  REGIONAL/FAB    Allergies  Allergen Reactions  . Sulfa Antibiotics Itching, Rash and Other (See Comments)    "Allergic," per MAR  . Amoxicillin Other (See Comments)    Per [facility] 09/15/17 Has patient had a PCN reaction causing immediate rash, facial/tongue/throat swelling, SOB or lightheadedness with hypotension: Unknown Has patient had a PCN reaction causing severe rash involving mucus membranes or skin necrosis: Unknown Has patient had a PCN reaction that required hospitalization: Unknown Has patient had a PCN reaction occurring within the last 10 years: Unknown If all of the above answers are "NO", then may proceed with Cephalosporin use.  . Avelox [Moxifloxacin Hcl In Nacl] Itching  . Doxycycline Other (See Comments)    "Allergic," per MAR  . Hista-Tabs [Triprolidine-Pse] Other (See Comments)    "Passed out"  . Pyrilamine-Phenylephrine Other (See Comments)    "Allergic," per [facility] 09/15/17  . Septra [Sulfamethoxazole-Trimethoprim] Itching, Rash and Other (See Comments)    "Allergic," per MAR    Allergies as of 05/31/2019      Reactions   Sulfa Antibiotics Itching, Rash, Other (See Comments)   "Allergic," per MAR   Amoxicillin Other (See Comments)   Per [facility] 09/15/17 Has patient had a PCN reaction causing immediate rash, facial/tongue/throat swelling, SOB or lightheadedness with hypotension: Unknown Has patient had a PCN reaction causing severe rash involving mucus membranes or skin necrosis: Unknown Has patient had a PCN reaction that required hospitalization: Unknown Has patient had a PCN reaction occurring within the last 10 years: Unknown If all of the above answers are "NO", then may proceed with Cephalosporin use.   Avelox [moxifloxacin Hcl In Nacl] Itching   Doxycycline Other (See Comments)   "Allergic," per  MAR   Hista-tabs [triprolidine-pse] Other (See Comments)   "Passed out"   Pyrilamine-phenylephrine Other (See Comments)   "Allergic," per [facility] 09/15/17   Septra [sulfamethoxazole-trimethoprim] Itching, Rash, Other (See Comments)   "Allergic," per [facility]      Medication List       Accurate as of May 31, 2019 11:59 PM. If you have any questions, ask your nurse or doctor.        acetaminophen 325 MG tablet Commonly known as: TYLENOL Take 650 mg by mouth every 6 (six) hours as needed (for pain).   aspirin EC 81 MG tablet Take 81 mg by mouth See admin instructions. Take 81 mg by mouth in the morning on Tuesday and Saturday. 7am-11am   benzonatate 100 MG capsule Commonly known as: TESSALON Take by mouth 3 (three) times daily as needed for cough.   chlorhexidine 0.12 % solution Commonly known as: PERIDEX Use as directed 15 mLs in the mouth or throat daily.   escitalopram 20 MG tablet Commonly known as: LEXAPRO Take 20 mg by mouth daily. In the morning 7am-11am.   famotidine 20 MG tablet Commonly known as: PEPCID Take 20 mg by mouth daily. In the morning 7am-11am.   Fluticasone-Salmeterol 250-50 MCG/DOSE Aepb Commonly known as: ADVAIR Inhale 1 puff into the lungs 2 (two) times daily. Morning: 7am-11am Evening: 4pm-7pm.  Rinse mouth after use   furosemide 20 MG tablet Commonly known as: LASIX Take  20 mg by mouth See admin instructions. Take 20 mg by mouth in the morning between 7am-11am and hold for SBP < 110   lactose free nutrition Liqd Take 237 mLs by mouth See admin instructions. Add 237 ml's to ice cream and consume two times a day. 10am & 4pm.   levothyroxine 25 MCG tablet Commonly known as: SYNTHROID Take 25 mcg by mouth daily before breakfast. 7:30am.   mirtazapine 15 MG tablet Commonly known as: REMERON Take 15 mg by mouth daily. 7:30pm-11pm.   olopatadine 0.1 % ophthalmic solution Commonly known as: PATANOL 1 drop 2 (two) times daily.   OXYGEN Inhale 2  L/min into the lungs as needed (to keep O2 SATS >90%).   polyethylene glycol 17 g packet Commonly known as: MIRALAX / GLYCOLAX Take 17 g by mouth daily. Mix with 6-8 ounces of fluid of choice. Once daily 7am-11:00am.   polyvinyl alcohol 1.4 % ophthalmic solution Commonly known as: LIQUIFILM TEARS Place 1 drop into both eyes 3 (three) times daily.   PreviDent 1.1 % Gel dental gel Generic drug: sodium fluoride Place 1 application onto teeth at bedtime.   raloxifene 60 MG tablet Commonly known as: EVISTA Take 60 mg by mouth daily. 4pm-7pm.   Ventolin HFA 108 (90 Base) MCG/ACT inhaler Generic drug: albuterol Inhale 2 puffs into the lungs every 6 (six) hours as needed for shortness of breath (dyspnea).   zinc oxide 20 % ointment Apply to buttocks as needed every shift after incontinent episodes       Review of Systems  Constitutional: Negative for activity change, appetite change, fever and unexpected weight change.  HENT: Positive for hearing loss. Negative for congestion and voice change.   Eyes: Negative for visual disturbance.  Respiratory: Positive for shortness of breath. Negative for cough and wheezing.        DOE  Cardiovascular: Positive for leg swelling.  Gastrointestinal: Negative for abdominal pain, constipation and vomiting.  Genitourinary: Negative for difficulty urinating, dysuria and urgency.  Musculoskeletal: Positive for arthralgias and gait problem.  Skin: Negative for color change.  Neurological: Negative for syncope, weakness and light-headedness.       Memory lapses.  Psychiatric/Behavioral: Negative for agitation, behavioral problems and sleep disturbance.    Immunization History  Administered Date(s) Administered  . Influenza Whole 12/27/2011, 11/05/2012  . Influenza, High Dose Seasonal PF 11/19/2018  . Influenza-Unspecified 11/18/2013, 11/03/2014, 11/16/2015, 11/13/2016, 11/17/2017  . Moderna SARS-COVID-2 Vaccination 02/08/2019, 03/08/2019  .  PFIZER SARS-COV-2 Vaccination 02/08/2019  . PPD Test 04/04/2008, 11/23/2014  . Pneumococcal Conjugate-13 09/30/2016  . Pneumococcal Polysaccharide-23 02/05/2008, 06/03/2008  . Td 02/05/2008, 06/03/2008   Pertinent  Health Maintenance Due  Topic Date Due  . INFLUENZA VACCINE  09/05/2019  . DEXA SCAN  Completed  . PNA vac Low Risk Adult  Completed   Fall Risk  10/09/2017 09/23/2016 08/27/2016 05/30/2015 05/16/2015  Falls in the past year? Yes Yes Yes Yes No  Comment - - [facility] Telephone Survey: data to providers prior to load - -  Number falls in past yr: 2 or more 1 1 1  -  Comment - - [facility] Telephone Survey Actual Response = 1 - -  Injury with Fall? Yes No No Yes -  Comment broke R wrist - - - -  Risk Factor Category  - - - - -  Risk for fall due to : - - - History of fall(s);Impaired balance/gait;Impaired mobility -  Follow up - - - Falls evaluation completed;Education provided -  Functional Status Survey:    Vitals:   05/31/19 0934  BP: 124/62  Pulse: 70  Resp: 18  Temp: 98.5 F (36.9 C)  SpO2: 91%  Weight: 136 lb 6.4 oz (61.9 kg)  Height: 4\' 8"  (1.422 m)   Body mass index is 30.58 kg/m. Physical Exam Vitals and nursing note reviewed.  Constitutional:      Appearance: Normal appearance.  HENT:     Head: Normocephalic and atraumatic.     Mouth/Throat:     Mouth: Mucous membranes are moist.  Eyes:     Extraocular Movements: Extraocular movements intact.     Conjunctiva/sclera: Conjunctivae normal.     Pupils: Pupils are equal, round, and reactive to light.  Cardiovascular:     Rate and Rhythm: Regular rhythm. Bradycardia present.     Heart sounds: No murmur.  Pulmonary:     Breath sounds: No rales.  Abdominal:     General: Bowel sounds are normal. There is no distension.     Palpations: Abdomen is soft.     Tenderness: There is no abdominal tenderness.  Musculoskeletal:     Cervical back: Normal range of motion and neck supple.     Right lower leg: Edema present.      Left lower leg: Edema present.     Comments: R shoulder limited over head ROM. Trace edema BLE  Skin:    General: Skin is warm and dry.  Neurological:     General: No focal deficit present.     Mental Status: She is alert. Mental status is at baseline.     Gait: Gait abnormal.     Comments: Oriented to person  Psychiatric:        Mood and Affect: Mood normal.        Behavior: Behavior normal.        Thought Content: Thought content normal.     Labs reviewed: Recent Labs    01/07/19 0135 01/07/19 0605 01/08/19 0357 01/09/19 0335 03/24/19 0000  NA 141   < > 138 143 141  K 4.0   < > 4.8 4.1 4.3  CL 105   < > 105 109 108  CO2 23   < > 24 27 25*  GLUCOSE 180*  --  169* 108*  --   BUN 13   < > 26* 25* 20  CREATININE 0.99   < > 1.41* 1.05* 1.0  CALCIUM 9.5   < > 9.0 9.0 9.1   < > = values in this interval not displayed.   Recent Labs    06/17/18 0000 03/24/19 0000  AST 14 20  ALT 11 18  ALKPHOS 45 57  ALBUMIN  --  3.5   Recent Labs    01/07/19 0135 01/07/19 0135 01/07/19 0605 01/07/19 0605 01/07/19 0737 01/08/19 0357 03/24/19 0000  WBC 12.9*   < > 7.9   < > 6.9 11.2* 7.3  NEUTROABS 7.5  --   --   --   --   --  3,329  HGB 14.0   < > 13.5   < > 13.2 12.3 13.8  HCT 45.7   < > 42.3   < > 41.2 38.7 41  MCV 103.2*   < > 94.2  --  93.4 94.4  --   PLT 213   < > 259   < > 258 235 232   < > = values in this interval not displayed.   Lab Results  Component Value Date  TSH 0.82 05/10/2019   Lab Results  Component Value Date   HGBA1C 6.0 06/17/2018   Lab Results  Component Value Date   CHOL 211 (A) 02/14/2014   HDL 47 02/14/2014   LDLCALC 133 02/14/2014   TRIG 155 02/14/2014    Significant Diagnostic Results in last 30 days:  No results found.  Assessment/Plan PVD (peripheral vascular disease) (HCC) Minimal edema BLE, continue Furosemide.   COPD (chronic obstructive pulmonary disease) (HCC) Stable, continue Advair.   GERD (gastroesophageal reflux  disease) Stable, continue Famotidine.   Hypothyroid Stable, TSH 1.2 03/11/19, continue Levothyroxine 37mcg qd.   Constipation Stable, continue MiraLax.   Depression, recurrent ([place]) Her mood is stable, continue Mirtazapine, Escitalopram.      Family/ staff Communication: plan of care reviewed with the patient and charge nurse.   Labs/tests ordered:  none  Time spend 25 minutes.

## 2019-06-01 ENCOUNTER — Encounter: Payer: Self-pay | Admitting: Nurse Practitioner

## 2019-06-01 NOTE — Assessment & Plan Note (Signed)
Stable, continue Advair.

## 2019-06-01 NOTE — Assessment & Plan Note (Signed)
Minimal edema BLE, continue Furosemide.  

## 2019-06-01 NOTE — Assessment & Plan Note (Signed)
Stable, TSH 1.2 03/11/19, continue Levothyroxine 43mcg qd.

## 2019-06-01 NOTE — Assessment & Plan Note (Signed)
Stable, continue MiraLax.  °

## 2019-06-01 NOTE — Assessment & Plan Note (Signed)
Stable, continue Famotidine.  

## 2019-06-01 NOTE — Assessment & Plan Note (Signed)
Her mood is stable, continue Mirtazapine, Escitalopram.

## 2019-06-15 ENCOUNTER — Encounter: Payer: Self-pay | Admitting: Nurse Practitioner

## 2019-06-15 ENCOUNTER — Non-Acute Institutional Stay (SKILLED_NURSING_FACILITY): Payer: Medicare Other | Admitting: Nurse Practitioner

## 2019-06-15 DIAGNOSIS — J42 Unspecified chronic bronchitis: Secondary | ICD-10-CM

## 2019-06-15 DIAGNOSIS — K5900 Constipation, unspecified: Secondary | ICD-10-CM

## 2019-06-15 DIAGNOSIS — E039 Hypothyroidism, unspecified: Secondary | ICD-10-CM

## 2019-06-15 DIAGNOSIS — R609 Edema, unspecified: Secondary | ICD-10-CM | POA: Diagnosis not present

## 2019-06-15 DIAGNOSIS — F339 Major depressive disorder, recurrent, unspecified: Secondary | ICD-10-CM

## 2019-06-15 DIAGNOSIS — K219 Gastro-esophageal reflux disease without esophagitis: Secondary | ICD-10-CM

## 2019-06-15 NOTE — Assessment & Plan Note (Signed)
No swelling, mouth appears dry, decreased oral intake, will dc Furosemide, observe.

## 2019-06-15 NOTE — Assessment & Plan Note (Signed)
Stable, continue MIraLax.  

## 2019-06-15 NOTE — Progress Notes (Signed)
Location:    [place] Room Number: 25 Place of Service:  SNF (31) Provider:  Marlana Latus NP  Shamell Hittle X, NP  Patient Care Team: Virl Coble X, NP as PCP - General (Internal Medicine) Newton Pigg, MD as Consulting Physician (Obstetrics and Gynecology) Monna Fam, MD as Consulting Physician (Ophthalmology) Melida Quitter, MD as Consulting Physician (Otolaryngology) Myrlene Broker, MD as Consulting Physician (Urology) [place], [facility] Virgie Dad, MD as Consulting Physician (Internal Medicine)  Extended Emergency Contact Information Primary Emergency Contact: Buchbinder,[name] Address: [place], [place] Phone: [phone] Work Phone: [phone] Mobile Phone: [phone] Relation: Son Secondary Emergency Contact: Teofilo [place] Phone: [phone] Work Phone: [phone] Relation: Friend  Code Status:  DNR Goals of care: Advanced Directive information Advanced Directives 06/15/2019  Does Patient Have a Medical Advance Directive? Yes  Type of Advance Directive Out of facility DNR (pink MOST or yellow form);Living will;Healthcare Power of Attorney  Does patient want to make changes to medical advance directive? No - Patient declined  Copy of [place] in Chart? Yes - validated most recent copy scanned in chart (See row information)  Pre-existing out of facility DNR order (yellow form or pink MOST form) Yellow form placed in chart (order not valid for inpatient use);Pink MOST form placed in chart (order not valid for inpatient use)     Chief Complaint  Patient presents with  . Medical Management of Chronic Issues  . Health Maintenance    TDAP    HPI:  Pt is a 86 y.o. female seen today for medical management of chronic diseases.    The [facility] for [facility], care assistance, her goal of care is comfort measures,  MOST no IVF/ABT. Hx of constipation, stable, on MiraLax qd. Her mood is stable, on Mirtazapine 15mg  qd, Escitalopram 20mg  qd.  Hypothyroidism, stable, on Levothyroxine 25mcg qd. No apparent edema, mouth is dry, on Furosemide 20mg  qd. COPD, stable, on Advair bid, prn Albuterol HFA.  GERD, stable, on Famotidine 20mg  qd.    Past Medical History:  Diagnosis Date  . Asthma   . Carpal tunnel syndrome 03/07/2009  . Disturbance of skin sensation 02/21/2009  . Diverticulosis of colon (without mention of hemorrhage) 02/16/2009  . Fracture of upper end of left humerus 11/18/14  . 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 Hypothyroid   . Osteoarthrosis, unspecified whether generalized or localized, unspecified site 02/16/2009  . Other abnormal blood chemistry 06/19/2010   hyperglycemia  . Other and unspecified hyperlipidemia 10/24/2009  . Other atopic dermatitis and related conditions 02/16/2009  . Otosclerosis, unspecified 02/22/2003  . Pain in limb 12/05/2009  . Senile osteoporosis 06/16/2010  . Sensorineural hearing loss, unspecified 02/21/2009  . Syncope and collapse 05/30/2009  . Unspecified nasal polyp 02/21/2009  . Unspecified urinary incontinence 02/21/2009   Past Surgical History:  Procedure Laterality Date  . ABDOMINAL HYSTERECTOMY  1999   Dr. Collier Bullock  . BREAST BIOPSY  07/16/1990   benign left breast needle biopsy  Dr. Margot Chimes  . CARPAL TUNNEL RELEASE  04/07/2009   right Dr. Daylene Katayama  . CATARACT EXTRACTION W/ INTRAOCULAR LENS  IMPLANT, BILATERAL Bilateral   . INTRAMEDULLARY (IM) NAIL INTERTROCHANTERIC Right 09/17/2017   Procedure: INTRAMEDULLARY (IM) NAIL INTERTROCHANTRIC HIP;  Surgeon: Nicholes Stairs, MD;  Location: [facility];  Service: Orthopedics;  Laterality: Right;  .  OPEN REDUCTION INTERNAL FIXATION (ORIF) DISTAL RADIAL FRACTURE Right 09/17/2017   Procedure: OPEN REDUCTION INTERNAL FIXATION (ORIF) DISTAL RADIAL FRACTURE;  Surgeon: Iran Planas, MD;  Location: [facility];  Service: Orthopedics;   Laterality: Right;  . TRIGGER FINGER RELEASE Left 08/09/2014   Procedure: RELEASE TRIGGER FINGER/A-1 PULLEY LEFT MIDDLE FINGER;  Surgeon: Daryll Brod, MD;  Location: [facility];  Service: Orthopedics;  Laterality: Left;  REGIONAL/FAB    Allergies  Allergen Reactions  . Sulfa Antibiotics Itching, Rash and Other (See Comments)    "Allergic," per MAR  . Amoxicillin Other (See Comments)    Per [facility] 09/15/17 Has patient had a PCN reaction causing immediate rash, facial/tongue/throat swelling, SOB or lightheadedness with hypotension: Unknown Has patient had a PCN reaction causing severe rash involving mucus membranes or skin necrosis: Unknown Has patient had a PCN reaction that required hospitalization: Unknown Has patient had a PCN reaction occurring within the last 10 years: Unknown If all of the above answers are "NO", then may proceed with Cephalosporin use.  . Avelox [Moxifloxacin Hcl In Nacl] Itching  . Doxycycline Other (See Comments)    "Allergic," per MAR  . Hista-Tabs [Triprolidine-Pse] Other (See Comments)    "Passed out"  . Pyrilamine-Phenylephrine Other (See Comments)    "Allergic," per [facility] 09/15/17  . Septra [Sulfamethoxazole-Trimethoprim] Itching, Rash and Other (See Comments)    "Allergic," per MAR    Allergies as of 06/15/2019      Reactions   Sulfa Antibiotics Itching, Rash, Other (See Comments)   "Allergic," per MAR   Amoxicillin Other (See Comments)   Per [facility] 09/15/17 Has patient had a PCN reaction causing immediate rash, facial/tongue/throat swelling, SOB or lightheadedness with hypotension: Unknown Has patient had a PCN reaction causing severe rash involving mucus membranes or skin necrosis: Unknown Has patient had a PCN reaction that required hospitalization: Unknown Has patient had a PCN reaction occurring within the last 10 years: Unknown If all of the above answers are "NO", then may proceed with Cephalosporin use.   Avelox [moxifloxacin Hcl In Nacl]  Itching   Doxycycline Other (See Comments)   "Allergic," per MAR   Hista-tabs [triprolidine-pse] Other (See Comments)   "Passed out"   Pyrilamine-phenylephrine Other (See Comments)   "Allergic," per [facility] 09/15/17   Septra [sulfamethoxazole-trimethoprim] Itching, Rash, Other (See Comments)   "Allergic," per [facility]      Medication List       Accurate as of Jun 15, 2019  4:17 PM. If you have any questions, ask your nurse or doctor.        acetaminophen 325 MG tablet Commonly known as: TYLENOL Take 650 mg by mouth every 6 (six) hours as needed (for pain).   aspirin EC 81 MG tablet Take 81 mg by mouth See admin instructions. Take 81 mg by mouth in the morning on Tuesday and Saturday. 7am-11am   benzonatate 100 MG capsule Commonly known as: TESSALON Take by mouth 3 (three) times daily as needed for cough.   chlorhexidine 0.12 % solution Commonly known as: PERIDEX Use as directed 15 mLs in the mouth or throat daily.   escitalopram 20 MG tablet Commonly known as: LEXAPRO Take 20 mg by mouth daily. In the morning 7am-11am.   famotidine 20 MG tablet Commonly known as: PEPCID Take 20 mg by mouth daily. In the morning 7am-11am.   Fluticasone-Salmeterol 250-50 MCG/DOSE Aepb Commonly known as: ADVAIR Inhale 1 puff into the lungs 2 (two) times daily. Morning: 7am-11am Evening: 4pm-7pm.  Rinse  mouth after use   furosemide 20 MG tablet Commonly known as: LASIX Take 20 mg by mouth See admin instructions. Take 20 mg by mouth in the morning between 7am-11am and hold for SBP < 110   lactose free nutrition Liqd Take 237 mLs by mouth See admin instructions. Add 237 ml's to ice cream and consume two times a day. 10am & 4pm.   levothyroxine 25 MCG tablet Commonly known as: SYNTHROID Take 25 mcg by mouth daily before breakfast. 7:30am.   mirtazapine 15 MG tablet Commonly known as: REMERON Take 15 mg by mouth daily. 7:30pm-11pm.   olopatadine 0.1 % ophthalmic solution Commonly known  as: PATANOL 1 drop 2 (two) times daily.   OXYGEN Inhale 2 L/min into the lungs as needed (to keep O2 SATS >90%).   polyethylene glycol 17 g packet Commonly known as: MIRALAX / GLYCOLAX Take 17 g by mouth daily. Mix with 6-8 ounces of fluid of choice. Once daily 7am-11:00am.   polyvinyl alcohol 1.4 % ophthalmic solution Commonly known as: LIQUIFILM TEARS Place 1 drop into both eyes 3 (three) times daily.   PreviDent 1.1 % Gel dental gel Generic drug: sodium fluoride Place 1 application onto teeth at bedtime.   raloxifene 60 MG tablet Commonly known as: EVISTA Take 60 mg by mouth daily. 4pm-7pm.   Ventolin HFA 108 (90 Base) MCG/ACT inhaler Generic drug: albuterol Inhale 2 puffs into the lungs every 6 (six) hours as needed for shortness of breath (dyspnea).   zinc oxide 20 % ointment Apply to buttocks as needed every shift after incontinent episodes       Review of Systems  Constitutional: Negative for activity change, appetite change, fever and unexpected weight change.  HENT: Positive for hearing loss. Negative for congestion and voice change.   Eyes: Negative for visual disturbance.  Respiratory: Positive for shortness of breath. Negative for cough and wheezing.        DOE  Cardiovascular: Negative for leg swelling.  Gastrointestinal: Negative for abdominal pain and constipation.  Genitourinary: Negative for difficulty urinating, dysuria and urgency.  Musculoskeletal: Positive for arthralgias and gait problem.  Skin: Negative for color change.  Neurological: Negative for dizziness, syncope, weakness and headaches.       Memory lapses.  Psychiatric/Behavioral: Negative for agitation, behavioral problems and sleep disturbance.    Immunization History  Administered Date(s) Administered  . Influenza Whole 12/27/2011, 11/05/2012  . Influenza, High Dose Seasonal PF 11/19/2018  . Influenza-Unspecified 11/18/2013, 11/03/2014, 11/16/2015, 11/13/2016, 11/17/2017  . Moderna  SARS-COVID-2 Vaccination 02/08/2019, 03/08/2019  . PFIZER SARS-COV-2 Vaccination 02/08/2019  . PPD Test 04/04/2008, 11/23/2014  . Pneumococcal Conjugate-13 09/30/2016  . Pneumococcal Polysaccharide-23 02/05/2008, 06/03/2008  . Td 02/05/2008, 06/03/2008   Pertinent  Health Maintenance Due  Topic Date Due  . INFLUENZA VACCINE  09/05/2019  . DEXA SCAN  Completed  . PNA vac Low Risk Adult  Completed   Fall Risk  10/09/2017 09/23/2016 08/27/2016 05/30/2015 05/16/2015  Falls in the past year? Yes Yes Yes Yes No  Comment - - [facility] Telephone Survey: data to providers prior to load - -  Number falls in past yr: 2 or more 1 1 1  -  Comment - - [facility] Telephone Survey Actual Response = 1 - -  Injury with Fall? Yes No No Yes -  Comment broke R wrist - - - -  Risk Factor Category  - - - - -  Risk for fall due to : - - - History of fall(s);Impaired balance/gait;Impaired mobility -  Follow up - - - Falls evaluation completed;Education provided -   Functional Status Survey:    Vitals:   06/15/19 1321  BP: 128/67  Pulse: 60  Resp: 18  Temp: (!) 97.3 F (36.3 C)  SpO2: 90%  Weight: 135 lb 9.6 oz (61.5 kg)  Height: 4\' 8"  (1.422 m)   Body mass index is 30.4 kg/m. Physical Exam Vitals and nursing note reviewed.  Constitutional:      Appearance: Normal appearance.  HENT:     Head: Normocephalic and atraumatic.     Nose: Nose normal.     Mouth/Throat:     Mouth: Mucous membranes are dry.  Eyes:     Extraocular Movements: Extraocular movements intact.     Conjunctiva/sclera: Conjunctivae normal.     Pupils: Pupils are equal, round, and reactive to light.  Cardiovascular:     Rate and Rhythm: Normal rate and regular rhythm.     Heart sounds: No murmur.  Pulmonary:     Breath sounds: No rales.  Abdominal:     General: Bowel sounds are normal. There is no distension.     Palpations: Abdomen is soft.     Tenderness: There is no abdominal tenderness.  Musculoskeletal:     Cervical back:  Normal range of motion and neck supple.     Right lower leg: No edema.     Left lower leg: No edema.     Comments: R shoulder limited over head ROM  Skin:    General: Skin is warm and dry.  Neurological:     General: No focal deficit present.     Mental Status: She is alert. Mental status is at baseline.     Gait: Gait abnormal.     Comments: Oriented to person  Psychiatric:        Mood and Affect: Mood normal.        Behavior: Behavior normal.        Thought Content: Thought content normal.     Labs reviewed: Recent Labs    01/07/19 0135 01/07/19 0605 01/08/19 0357 01/09/19 0335 03/24/19 0000  NA 141   < > 138 143 141  K 4.0   < > 4.8 4.1 4.3  CL 105   < > 105 109 108  CO2 23   < > 24 27 25*  GLUCOSE 180*  --  169* 108*  --   BUN 13   < > 26* 25* 20  CREATININE 0.99   < > 1.41* 1.05* 1.0  CALCIUM 9.5   < > 9.0 9.0 9.1   < > = values in this interval not displayed.   Recent Labs    06/17/18 0000 03/24/19 0000  AST 14 20  ALT 11 18  ALKPHOS 45 57  ALBUMIN  --  3.5   Recent Labs    01/07/19 0135 01/07/19 0135 01/07/19 0605 01/07/19 0605 01/07/19 0737 01/08/19 0357 03/24/19 0000  WBC 12.9*   < > 7.9   < > 6.9 11.2* 7.3  NEUTROABS 7.5  --   --   --   --   --  3,329  HGB 14.0   < > 13.5   < > 13.2 12.3 13.8  HCT 45.7   < > 42.3   < > 41.2 38.7 41  MCV 103.2*   < > 94.2  --  93.4 94.4  --   PLT 213   < > 259   < > 258 235 232   < > =  values in this interval not displayed.   Lab Results  Component Value Date   TSH 0.82 05/10/2019   Lab Results  Component Value Date   HGBA1C 6.0 06/17/2018   Lab Results  Component Value Date   CHOL 211 (A) 02/14/2014   HDL 47 02/14/2014   LDLCALC 133 02/14/2014   TRIG 155 02/14/2014    Significant Diagnostic Results in last 30 days:  No results found.  Assessment/Plan Edema No swelling, mouth appears dry, decreased oral intake, will dc Furosemide, observe.   Depression, recurrent ([place]) Her mood is stable,  continue Escitalopram, Mirtazapine.   Constipation Stable, continue MIraLax.   Hypothyroid Stable, TSH 1.2 03/11/19, continue Levothyroxine.   GERD (gastroesophageal reflux disease) Stable, continue Famotidine.   COPD (chronic obstructive pulmonary disease) (HCC) Stable, continue Advair, prn HFA albuterol.      Family/ staff Communication: plan of care reviewed with the patient and charge nurse.   Labs/tests ordered:  none  Time spend 25 minutes.

## 2019-06-15 NOTE — Assessment & Plan Note (Signed)
Her mood is stable, continue Escitalopram, Mirtazapine.  

## 2019-06-15 NOTE — Assessment & Plan Note (Signed)
Stable, continue Advair, prn HFA albuterol.

## 2019-06-15 NOTE — Assessment & Plan Note (Signed)
Stable, continue Famotidine.  

## 2019-06-15 NOTE — Assessment & Plan Note (Signed)
Stable, TSH 1.2 03/11/19, continue Levothyroxine.

## 2019-06-17 LAB — BASIC METABOLIC PANEL
BUN: 27 — AB (ref 4–21)
CO2: 26 — AB (ref 13–22)
Chloride: 110 — AB (ref 99–108)
Creatinine: 1 (ref 0.5–1.1)
Glucose: 94
Potassium: 4 (ref 3.4–5.3)
Sodium: 143 (ref 137–147)

## 2019-06-17 LAB — CBC AND DIFFERENTIAL
HCT: 39 (ref 36–46)
Hemoglobin: 12.9 (ref 12.0–16.0)
Neutrophils Absolute: 2117
Platelets: 224 (ref 150–399)
WBC: 5.4

## 2019-06-17 LAB — HEPATIC FUNCTION PANEL
ALT: 11 (ref 7–35)
AST: 14 (ref 13–35)
Alkaline Phosphatase: 45 (ref 25–125)
Bilirubin, Total: 0.3

## 2019-06-17 LAB — COMPREHENSIVE METABOLIC PANEL
Albumin: 3.6 (ref 3.5–5.0)
Calcium: 9.3 (ref 8.7–10.7)
Globulin: 2.2

## 2019-06-17 LAB — CBC: RBC: 4.14 (ref 3.87–5.11)

## 2019-06-17 LAB — HEMOGLOBIN A1C: Hemoglobin A1C: 6

## 2019-07-26 ENCOUNTER — Non-Acute Institutional Stay (SKILLED_NURSING_FACILITY): Payer: Medicare Other | Admitting: Internal Medicine

## 2019-07-26 ENCOUNTER — Encounter: Payer: Self-pay | Admitting: Internal Medicine

## 2019-07-26 DIAGNOSIS — E039 Hypothyroidism, unspecified: Secondary | ICD-10-CM | POA: Diagnosis not present

## 2019-07-26 DIAGNOSIS — J42 Unspecified chronic bronchitis: Secondary | ICD-10-CM

## 2019-07-26 DIAGNOSIS — R001 Bradycardia, unspecified: Secondary | ICD-10-CM

## 2019-07-26 DIAGNOSIS — R609 Edema, unspecified: Secondary | ICD-10-CM

## 2019-07-26 DIAGNOSIS — K219 Gastro-esophageal reflux disease without esophagitis: Secondary | ICD-10-CM | POA: Diagnosis not present

## 2019-07-26 DIAGNOSIS — F339 Major depressive disorder, recurrent, unspecified: Secondary | ICD-10-CM | POA: Diagnosis not present

## 2019-07-26 NOTE — Progress Notes (Signed)
Location:    [place] Room Number: 25 Place of Service:  SNF (31) Provider:  Veleta Miners MD   Mast, Man X, NP  Patient Care Team: Mast, Man X, NP as PCP - General (Internal Medicine) Newton Pigg, MD as Consulting Physician (Obstetrics and Gynecology) Monna Fam, MD as Consulting Physician (Ophthalmology) Melida Quitter, MD as Consulting Physician (Otolaryngology) Myrlene Broker, MD as Consulting Physician (Urology) [place], [facility] Virgie Dad, MD as Consulting Physician (Internal Medicine)  Extended Emergency Contact Information Primary Emergency Contact: Rickerson,[name] Address: [place], [place] Phone: [phone] Work Phone: [phone] Mobile Phone: [phone] Relation: Son Secondary Emergency Contact: Teofilo [place] Phone: [phone] Work Phone: [phone] Relation: Friend  Code Status:  DNR Goals of care: Advanced Directive information Advanced Directives 07/26/2019  Does Patient Have a Medical Advance Directive? Yes  Type of Paramedic of [place];Out of facility DNR (pink MOST or yellow form);Living will  Does patient want to make changes to medical advance directive? No - Patient declined  Copy of Deputy in Chart? Yes - validated most recent copy scanned in chart (See row information)  Pre-existing out of facility DNR order (yellow form or pink MOST form) Yellow form placed in chart (order not valid for inpatient use);Pink MOST form placed in chart (order not valid for inpatient use)     Chief Complaint  Patient presents with  . Medical Management of Chronic Issues  . Health Maintenance    TDAP    HPI:  Pt is a 86 y.o. female seen today for medical management of chronic diseases.    Patient is a long-term care resident Patient has h/o Sinus Bradycardia, LE edema,  Hypothyroidism, Depression, Chronic Sinusitis, H/o Hip Fracture,And Proximal Humerus Fracture due to Fall with Delayed healing  Has been stable No New Issues No Nursing issues Weight down slightly but Appetite is good  Past Medical History:  Diagnosis Date  . Asthma   . Carpal tunnel syndrome 03/07/2009  . Disturbance of skin sensation 02/21/2009  . Diverticulosis of colon (without mention of hemorrhage) 02/16/2009  . Fracture of upper end of left humerus 11/18/14  . 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 Hypothyroid   . Osteoarthrosis, unspecified whether generalized or localized, unspecified site 02/16/2009  . Other abnormal blood chemistry 06/19/2010   hyperglycemia  . Other and unspecified hyperlipidemia 10/24/2009  . Other atopic dermatitis and related conditions 02/16/2009  . Otosclerosis, unspecified 02/22/2003  . Pain in limb 12/05/2009  . Senile osteoporosis 06/16/2010  . Sensorineural hearing loss, unspecified 02/21/2009  . Syncope and collapse 05/30/2009  . Unspecified nasal polyp 02/21/2009  . Unspecified urinary incontinence 02/21/2009   Past Surgical History:  Procedure Laterality Date  . ABDOMINAL HYSTERECTOMY  1999   Dr. Collier Bullock  . BREAST BIOPSY  07/16/1990   benign left breast needle biopsy  Dr. Margot Chimes  . CARPAL TUNNEL RELEASE  04/07/2009   right Dr. Daylene Katayama  . CATARACT EXTRACTION W/ INTRAOCULAR LENS  IMPLANT, BILATERAL Bilateral   . INTRAMEDULLARY (IM) NAIL INTERTROCHANTERIC Right 09/17/2017   Procedure: INTRAMEDULLARY (IM) NAIL INTERTROCHANTRIC HIP;  Surgeon: Nicholes Stairs, MD;  Location: [facility];  Service: Orthopedics;  Laterality: Right;  . OPEN REDUCTION INTERNAL FIXATION (ORIF) DISTAL RADIAL FRACTURE Right 09/17/2017   Procedure: OPEN REDUCTION INTERNAL FIXATION (ORIF) DISTAL RADIAL FRACTURE;  Surgeon:  Iran Planas, MD;  Location: [facility];  Service: Orthopedics;  Laterality: Right;  . TRIGGER FINGER RELEASE Left 08/09/2014   Procedure: RELEASE TRIGGER FINGER/A-1 PULLEY  LEFT MIDDLE FINGER;  Surgeon: Daryll Brod, MD;  Location: [facility];  Service: Orthopedics;  Laterality: Left;  REGIONAL/FAB    Allergies  Allergen Reactions  . Sulfa Antibiotics Itching, Rash and Other (See Comments)    "Allergic," per MAR  . Amoxicillin Other (See Comments)    Per [facility] 09/15/17 Has patient had a PCN reaction causing immediate rash, facial/tongue/throat swelling, SOB or lightheadedness with hypotension: Unknown Has patient had a PCN reaction causing severe rash involving mucus membranes or skin necrosis: Unknown Has patient had a PCN reaction that required hospitalization: Unknown Has patient had a PCN reaction occurring within the last 10 years: Unknown If all of the above answers are "NO", then may proceed with Cephalosporin use.  . Avelox [Moxifloxacin Hcl In Nacl] Itching  . Doxycycline Other (See Comments)    "Allergic," per MAR  . Hista-Tabs [Triprolidine-Pse] Other (See Comments)    "Passed out"  . Pyrilamine-Phenylephrine Other (See Comments)    "Allergic," per [facility] 09/15/17  . Septra [Sulfamethoxazole-Trimethoprim] Itching, Rash and Other (See Comments)    "Allergic," per MAR    Allergies as of 07/26/2019      Reactions   Sulfa Antibiotics Itching, Rash, Other (See Comments)   "Allergic," per MAR   Amoxicillin Other (See Comments)   Per [facility] 09/15/17 Has patient had a PCN reaction causing immediate rash, facial/tongue/throat swelling, SOB or lightheadedness with hypotension: Unknown Has patient had a PCN reaction causing severe rash involving mucus membranes or skin necrosis: Unknown Has patient had a PCN reaction that required hospitalization: Unknown Has patient had a PCN reaction occurring within the last 10 years: Unknown If all of the above answers are "NO", then may proceed with Cephalosporin use.   Avelox [moxifloxacin Hcl In Nacl] Itching   Doxycycline Other (See Comments)   "Allergic," per MAR   Hista-tabs [triprolidine-pse] Other  (See Comments)   "Passed out"   Pyrilamine-phenylephrine Other (See Comments)   "Allergic," per [facility] 09/15/17   Septra [sulfamethoxazole-trimethoprim] Itching, Rash, Other (See Comments)   "Allergic," per [facility]      Medication List       Accurate as of July 26, 2019 10:03 AM. If you have any questions, ask your nurse or doctor.        STOP taking these medications   furosemide 20 MG tablet Commonly known as: LASIX Stopped by: Virgie Dad, MD     TAKE these medications   acetaminophen 325 MG tablet Commonly known as: TYLENOL Take 650 mg by mouth every 6 (six) hours as needed (for pain).   aspirin EC 81 MG tablet Take 81 mg by mouth See admin instructions. Take 81 mg by mouth in the morning on Tuesday and Saturday. 7am-11am   benzonatate 100 MG capsule Commonly known as: TESSALON Take by mouth 3 (three) times daily as needed for cough.   chlorhexidine 0.12 % solution Commonly known as: PERIDEX Use as directed 15 mLs in the mouth or throat daily.   escitalopram 20 MG tablet Commonly known as: LEXAPRO Take 20 mg by mouth daily. In the morning 7am-11am.   famotidine 20 MG tablet Commonly known as: PEPCID Take 20 mg by mouth daily. In the morning 7am-11am.   Fluticasone-Salmeterol 250-50 MCG/DOSE Aepb Commonly known as: ADVAIR Inhale 1 puff into the lungs 2 (two) times daily. Morning:  7am-11am Evening: 4pm-7pm.  Rinse mouth after use   lactose free nutrition Liqd Take 237 mLs by mouth See admin instructions. Add 237 ml's to ice cream and consume two times a day. 10am & 4pm.   levothyroxine 25 MCG tablet Commonly known as: SYNTHROID Take 25 mcg by mouth daily before breakfast. 7:30am.   mirtazapine 15 MG tablet Commonly known as: REMERON Take 15 mg by mouth daily. 7:30pm-11pm.   olopatadine 0.1 % ophthalmic solution Commonly known as: PATANOL 1 drop 2 (two) times daily.   OXYGEN Inhale 2 L/min into the lungs as needed (to keep O2 SATS >90%).   polyethylene  glycol 17 g packet Commonly known as: MIRALAX / GLYCOLAX Take 17 g by mouth daily. Mix with 6-8 ounces of fluid of choice. Once daily 7am-11:00am.   polyvinyl alcohol 1.4 % ophthalmic solution Commonly known as: LIQUIFILM TEARS Place 1 drop into both eyes 3 (three) times daily.   PreviDent 1.1 % Gel dental gel Generic drug: sodium fluoride Place 1 application onto teeth at bedtime.   raloxifene 60 MG tablet Commonly known as: EVISTA Take 60 mg by mouth daily. 4pm-7pm.   Ventolin HFA 108 (90 Base) MCG/ACT inhaler Generic drug: albuterol Inhale 2 puffs into the lungs every 6 (six) hours as needed for shortness of breath (dyspnea).   zinc oxide 20 % ointment Apply to buttocks as needed every shift after incontinent episodes       Review of Systems  Review of Systems  Constitutional: Negative for activity change, appetite change, chills, diaphoresis, fatigue and fever.  HENT: Negative for mouth sores, postnasal drip, rhinorrhea, sinus pain and sore throat.   Respiratory: Negative for apnea, cough, chest tightness, shortness of breath and wheezing.   Cardiovascular: Negative for chest pain, palpitations and leg swelling.  Gastrointestinal: Negative for abdominal distention, abdominal pain, constipation, diarrhea, nausea and vomiting.  Genitourinary: Negative for dysuria and frequency.  Musculoskeletal: Negative for arthralgias, joint swelling and myalgias.  Skin: Negative for rash.  Neurological: Negative for dizziness, syncope, weakness, light-headedness and numbness.  Psychiatric/Behavioral: Negative for behavioral problems, confusion and sleep disturbance.     Immunization History  Administered Date(s) Administered  . Influenza Whole 12/27/2011, 11/05/2012  . Influenza, High Dose Seasonal PF 11/19/2018  . Influenza-Unspecified 11/18/2013, 11/03/2014, 11/16/2015, 11/13/2016, 11/17/2017  . Moderna SARS-COVID-2 Vaccination 02/08/2019, 03/08/2019  . PFIZER SARS-COV-2  Vaccination 02/08/2019  . PPD Test 04/04/2008, 11/23/2014  . Pneumococcal Conjugate-13 09/30/2016  . Pneumococcal Polysaccharide-23 02/05/2008, 06/03/2008  . Td 02/05/2008, 06/03/2008   Pertinent  Health Maintenance Due  Topic Date Due  . INFLUENZA VACCINE  09/05/2019  . DEXA SCAN  Completed  . PNA vac Low Risk Adult  Completed   Fall Risk  10/09/2017 09/23/2016 08/27/2016 05/30/2015 05/16/2015  Falls in the past year? Yes Yes Yes Yes No  Comment - - [facility] Telephone Survey: data to providers prior to load - -  Number falls in past yr: 2 or more 1 1 1  -  Comment - - Emmi Telephone Survey Actual Response = 1 - -  Injury with Fall? Yes No No Yes -  Comment broke R wrist - - - -  Risk Factor Category  - - - - -  Risk for fall due to : - - - History of fall(s);Impaired balance/gait;Impaired mobility -  Follow up - - - Falls evaluation completed;Education provided -   Functional Status Survey:    Vitals:   07/26/19 0951  BP: 112/70  Pulse: (!) 54  Resp: Marland Kitchen)  22  Temp: 97.8 F (36.6 C)  SpO2: 92%  Weight: 133 lb 6.4 oz (60.5 kg)  Height: 4\' 8"  (1.422 m)   Body mass index is 29.91 kg/m. Physical Exam Constitutional:  Well-developed and well-nourished.  HENT:  Head: Normocephalic.  Mouth/Throat: Oropharynx is clear and moist.  Eyes: Pupils are equal, round, and reactive to light.  Neck: Neck supple.  Cardiovascular: Normal rate and normal heart sounds.  No murmur heard. Pulmonary/Chest: Effort normal and breath sounds normal. No respiratory distress. No wheezes. She has no rales.  Abdominal: Soft. Bowel sounds are normal. No distension. There is no tenderness. There is no rebound.  Musculoskeletal: Mild edema BIlateral Lymphadenopathy: none Neurological: No Focal deficits.  Skin: Skin is warm and dry.  Psychiatric: Normal mood and affect. Behavior is normal. Thought content normal.   Labs reviewed: Recent Labs    01/07/19 0135 01/07/19 0605 01/08/19 0357 01/09/19 0335  03/24/19 0000  NA 141   < > 138 143 141  K 4.0   < > 4.8 4.1 4.3  CL 105   < > 105 109 108  CO2 23   < > 24 27 25*  GLUCOSE 180*  --  169* 108*  --   BUN 13   < > 26* 25* 20  CREATININE 0.99   < > 1.41* 1.05* 1.0  CALCIUM 9.5   < > 9.0 9.0 9.1   < > = values in this interval not displayed.   Recent Labs    03/24/19 0000  AST 20  ALT 18  ALKPHOS 57  ALBUMIN 3.5   Recent Labs    01/07/19 0135 01/07/19 0135 01/07/19 0605 01/07/19 0605 01/07/19 0737 01/08/19 0357 03/24/19 0000  WBC 12.9*   < > 7.9   < > 6.9 11.2* 7.3  NEUTROABS 7.5  --   --   --   --   --  3,329  HGB 14.0   < > 13.5   < > 13.2 12.3 13.8  HCT 45.7   < > 42.3   < > 41.2 38.7 41  MCV 103.2*   < > 94.2  --  93.4 94.4  --   PLT 213   < > 259   < > 258 235 232   < > = values in this interval not displayed.   Lab Results  Component Value Date   TSH 0.82 05/10/2019   Lab Results  Component Value Date   HGBA1C 6.0 06/17/2018   Lab Results  Component Value Date   CHOL 211 (A) 02/14/2014   HDL 47 02/14/2014   LDLCALC 133 02/14/2014   TRIG 155 02/14/2014    Significant Diagnostic Results in last 30 days:  No results found.  Assessment/Plan Edema, unspecified type Stable on Lasix Bun and Creat stable  Depression, recurrent (HCC) On Remeron and Lexapro Not candidate for GDR Acquired hypothyroidism TSH normal in 4/21 Gastroesophageal reflux disease without esophagitis On Pepcid Chronic bronchitis, unspecified chronic bronchitis type (D[place]) On Advair Sinus bradycardia Stable No Symptoms    Family/ staff Communication:   Labs/tests ordered:

## 2019-08-13 ENCOUNTER — Encounter: Payer: Self-pay | Admitting: Nurse Practitioner

## 2019-08-13 ENCOUNTER — Non-Acute Institutional Stay (SKILLED_NURSING_FACILITY): Payer: Medicare Other | Admitting: Nurse Practitioner

## 2019-08-13 DIAGNOSIS — E039 Hypothyroidism, unspecified: Secondary | ICD-10-CM

## 2019-08-13 DIAGNOSIS — R609 Edema, unspecified: Secondary | ICD-10-CM | POA: Diagnosis not present

## 2019-08-13 DIAGNOSIS — J42 Unspecified chronic bronchitis: Secondary | ICD-10-CM

## 2019-08-13 DIAGNOSIS — F339 Major depressive disorder, recurrent, unspecified: Secondary | ICD-10-CM | POA: Diagnosis not present

## 2019-08-13 DIAGNOSIS — K5900 Constipation, unspecified: Secondary | ICD-10-CM

## 2019-08-13 DIAGNOSIS — K219 Gastro-esophageal reflux disease without esophagitis: Secondary | ICD-10-CM

## 2019-08-13 DIAGNOSIS — R627 Adult failure to thrive: Secondary | ICD-10-CM

## 2019-08-13 NOTE — Progress Notes (Signed)
Location:   SNF [place] Room Number: 25 Place of Service:  SNF (31) Provider: Lennie Odor Jaala Bohle NP  Quincey Nored X, NP  Patient Care Team: Zenya Hickam X, NP as PCP - General (Internal Medicine) Newton Pigg, MD as Consulting Physician (Obstetrics and Gynecology) Monna Fam, MD as Consulting Physician (Ophthalmology) Melida Quitter, MD as Consulting Physician (Otolaryngology) Myrlene Broker, MD as Consulting Physician (Urology) [employer] Virgie Dad, MD as Consulting Physician (Internal Medicine)  Extended Emergency Contact Information Primary Emergency Contact: Stancil,[name] Address: [place], [place] Phone: [phone] Work Phone: [phone] Mobile Phone: [phone] Relation: Son Secondary Emergency Contact: Teofilo [place] of [place] Phone: [phone] Work Phone: [phone] Relation: Friend  Code Status:  DNR Goals of care: Advanced Directive information Advanced Directives 08/13/2019  Does Patient Have a Medical Advance Directive? Yes  Type of Paramedic of [place];Living will;Out of facility DNR (pink MOST or yellow form)  Does patient want to make changes to medical advance directive? No - Patient declined  Copy of [place] in Chart? Yes - validated most recent copy scanned in chart (See row information)  Pre-existing out of facility DNR order (yellow form or pink MOST form) Yellow form placed in chart (order not valid for inpatient use);Pink MOST form placed in chart (order not valid for inpatient use)     Chief Complaint  Patient presents with  . Medical Management of Chronic Issues    Routine visit    HPI:  Pt is a 86 y.o. female seen today for medical management of chronic diseases.     The patient resides in SNF [facility] for safety, care assistance, her goal of care is comfort measures, MOST no IVF/ABT.   Hx of  constipation, stable, on MiraLax qd.   Her mood is stable, on Mirtazapine 15mg  qd, Escitalopram 20mg  qd.   Hypothyroidism, stable, on Levothyroxine 42mcg qd.   No apparent edema, on Furosemide 20mg  qd.   COPD, stable, on Advair bid, prn Albuterol HFA.    GERD, stable, on Famotidine 20mg  qd.    Past Medical History:  Diagnosis Date  . Asthma   . Carpal tunnel syndrome 03/07/2009  . Disturbance of skin sensation 02/21/2009  . Diverticulosis of colon (without mention of hemorrhage) 02/16/2009  . Fracture of upper end of left humerus 11/18/14  . 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 Hypothyroid   . Osteoarthrosis, unspecified whether generalized or localized, unspecified site 02/16/2009  . Other abnormal blood chemistry 06/19/2010   hyperglycemia  . Other and unspecified hyperlipidemia 10/24/2009  . Other atopic dermatitis and related conditions 02/16/2009  . Otosclerosis, unspecified 02/22/2003  . Pain in limb 12/05/2009  . Senile osteoporosis 06/16/2010  . Sensorineural hearing loss, unspecified 02/21/2009  . Syncope and collapse 05/30/2009  . Unspecified nasal polyp 02/21/2009  . Unspecified urinary incontinence 02/21/2009   Past Surgical History:  Procedure Laterality Date  . ABDOMINAL HYSTERECTOMY  1999   Dr. Collier Bullock  . BREAST BIOPSY  07/16/1990   benign left breast needle biopsy  Dr. Margot Chimes  . CARPAL TUNNEL RELEASE  04/07/2009   right Dr. Daylene Katayama  . CATARACT EXTRACTION W/ INTRAOCULAR LENS  IMPLANT, BILATERAL Bilateral   . INTRAMEDULLARY (IM) NAIL INTERTROCHANTERIC Right 09/17/2017   Procedure: INTRAMEDULLARY (IM) NAIL INTERTROCHANTRIC HIP;  Surgeon: Nicholes Stairs, MD;  Location: [place];  Service: Orthopedics;  Laterality: Right;  . OPEN REDUCTION INTERNAL FIXATION (ORIF) DISTAL RADIAL FRACTURE Right 09/17/2017   Procedure: OPEN REDUCTION INTERNAL FIXATION (ORIF) DISTAL RADIAL FRACTURE;  Surgeon: Iran Planas, MD;  Location: [place];  Service: Orthopedics;  Laterality: Right;  . TRIGGER  FINGER RELEASE Left 08/09/2014   Procedure: RELEASE TRIGGER FINGER/A-1 PULLEY LEFT MIDDLE FINGER;  Surgeon: Daryll Brod, MD;  Location: [place];  Service: Orthopedics;  Laterality: Left;  REGIONAL/FAB    Allergies  Allergen Reactions  . Sulfa Antibiotics Itching, Rash and Other (See Comments)    "Allergic," per MAR  . Amoxicillin Other (See Comments)    Per [facility] 09/15/17 Has patient had a PCN reaction causing immediate rash, facial/tongue/throat swelling, SOB or lightheadedness with hypotension: Unknown Has patient had a PCN reaction causing severe rash involving mucus membranes or skin necrosis: Unknown Has patient had a PCN reaction that required hospitalization: Unknown Has patient had a PCN reaction occurring within the last 10 years: Unknown If all of the above answers are "NO", then may proceed with Cephalosporin use.  . Avelox [Moxifloxacin Hcl In Nacl] Itching  . Doxycycline Other (See Comments)    "Allergic," per MAR  . Hista-Tabs [Triprolidine-Pse] Other (See Comments)    "Passed out"  . Pyrilamine-Phenylephrine Other (See Comments)    "Allergic," per [facility] 09/15/17  . Septra [Sulfamethoxazole-Trimethoprim] Itching, Rash and Other (See Comments)    "Allergic," per MAR    Allergies as of 08/13/2019      Reactions   Sulfa Antibiotics Itching, Rash, Other (See Comments)   "Allergic," per MAR   Amoxicillin Other (See Comments)   Per [facility] 09/15/17 Has patient had a PCN reaction causing immediate rash, facial/tongue/throat swelling, SOB or lightheadedness with hypotension: Unknown Has patient had a PCN reaction causing severe rash involving mucus membranes or skin necrosis: Unknown Has patient had a PCN reaction that required hospitalization: Unknown Has patient had a PCN reaction occurring within the last 10 years: Unknown If all of the above answers are "NO", then may proceed with Cephalosporin use.   Avelox [moxifloxacin Hcl In Nacl] Itching   Doxycycline Other (See  Comments)   "Allergic," per MAR   Hista-tabs [triprolidine-pse] Other (See Comments)   "Passed out"   Pyrilamine-phenylephrine Other (See Comments)   "Allergic," per [facility] 09/15/17   Septra [sulfamethoxazole-trimethoprim] Itching, Rash, Other (See Comments)   "Allergic," per [facility]      Medication List       Accurate as of August 13, 2019 11:59 PM. If you have any questions, ask your nurse or doctor.        STOP taking these medications   olopatadine 0.1 % ophthalmic solution Commonly known as: PATANOL Stopped by: Pattricia Weiher X Briella Hobday, NP     TAKE these medications   acetaminophen 325 MG tablet Commonly known as: TYLENOL Take 650 mg by mouth every 6 (six) hours as needed (for pain).   aspirin EC 81 MG tablet Take 81 mg by mouth See admin instructions. Take 81 mg by mouth in the morning on Tuesday and Saturday. 7am-11am   benzonatate 100 MG capsule Commonly known as: TESSALON Take by mouth 3 (three) times daily as needed for cough.   chlorhexidine 0.12 % solution Commonly known as: PERIDEX Use as directed 15 mLs in the mouth or throat daily.   escitalopram 20 MG tablet Commonly known as: LEXAPRO Take 20 mg by mouth daily. In the morning 7am-11am.   famotidine 20 MG tablet Commonly known as: PEPCID Take 20 mg  by mouth daily. In the morning 7am-11am.   Fluticasone-Salmeterol 250-50 MCG/DOSE Aepb Commonly known as: ADVAIR Inhale 1 puff into the lungs 2 (two) times daily. Morning: 7am-11am Evening: 4pm-7pm.  Rinse mouth after use   lactose free nutrition Liqd Take 237 mLs by mouth See admin instructions. Add 237 ml's to ice cream and consume two times a day. 10am & 4pm.   levothyroxine 25 MCG tablet Commonly known as: SYNTHROID Take 25 mcg by mouth daily before breakfast. 7:30am.   mirtazapine 15 MG tablet Commonly known as: REMERON Take 15 mg by mouth daily. 7:30pm-11pm.   OXYGEN Inhale 2 L/min into the lungs as needed (to keep O2 SATS >90%).   polyethylene glycol 17 g  packet Commonly known as: MIRALAX / GLYCOLAX Take 17 g by mouth daily. Mix with 6-8 ounces of fluid of choice. Once daily 7am-11:00am.   polyvinyl alcohol 1.4 % ophthalmic solution Commonly known as: LIQUIFILM TEARS Place 1 drop into both eyes 3 (three) times daily.   PreviDent 1.1 % Gel dental gel Generic drug: sodium fluoride Place 1 application onto teeth at bedtime. brush on teeth with toothbrush after PM MOUTHCARE . SPIT OUT EXCESS AND DO NOT RINSE   raloxifene 60 MG tablet Commonly known as: EVISTA Take 60 mg by mouth daily. 4pm-7pm.   Ventolin HFA 108 (90 Base) MCG/ACT inhaler Generic drug: albuterol Inhale 2 puffs into the lungs every 6 (six) hours as needed for shortness of breath (dyspnea).   zinc oxide 20 % ointment Apply to buttocks as needed every shift after incontinent episodes       Review of Systems  Constitutional: Negative for fatigue, fever and unexpected weight change.  HENT: Positive for hearing loss. Negative for congestion and voice change.   Eyes: Negative for visual disturbance.  Respiratory: Positive for shortness of breath. Negative for cough and wheezing.        DOE  Cardiovascular: Negative for leg swelling.  Gastrointestinal: Negative for abdominal pain and constipation.  Genitourinary: Negative for difficulty urinating, dysuria and urgency.  Musculoskeletal: Positive for arthralgias and gait problem.  Skin: Negative for color change.  Neurological: Negative for syncope, weakness and light-headedness.       Memory lapses.  Psychiatric/Behavioral: Negative for behavioral problems and sleep disturbance. The patient is not nervous/anxious.     Immunization History  Administered Date(s) Administered  . Influenza Whole 12/27/2011, 11/05/2012  . Influenza, High Dose Seasonal PF 11/19/2018  . Influenza-Unspecified 11/18/2013, 11/03/2014, 11/16/2015, 11/13/2016, 11/17/2017  . Moderna SARS-COVID-2 Vaccination 02/08/2019, 03/08/2019  . PFIZER  SARS-COV-2 Vaccination 02/08/2019  . PPD Test 04/04/2008, 11/23/2014  . Pneumococcal Conjugate-13 09/30/2016  . Pneumococcal Polysaccharide-23 02/05/2008, 06/03/2008  . Td 02/05/2008, 06/03/2008   Pertinent  Health Maintenance Due  Topic Date Due  . INFLUENZA VACCINE  09/05/2019  . DEXA SCAN  Completed  . PNA vac Low Risk Adult  Completed   Fall Risk  10/09/2017 09/23/2016 08/27/2016 05/30/2015 05/16/2015  Falls in the past year? Yes Yes Yes Yes No  Comment - - Emmi Telephone Survey: data to providers prior to load - -  Number falls in past yr: 2 or more 1 1 1  -  Comment - - Emmi Telephone Survey Actual Response = 1 - -  Injury with Fall? Yes No No Yes -  Comment broke R wrist - - - -  Risk Factor Category  - - - - -  Risk for fall due to : - - - History of fall(s);Impaired balance/gait;Impaired mobility -  Follow up - - - Falls evaluation completed;Education provided -   Functional Status Survey:    Vitals:   08/13/19 1600  BP: 113/63  Pulse: (!) 56  Resp: 20  Temp: (!) 97 F (36.1 C)  SpO2: 93%  Weight: 131 lb 11.2 oz (59.7 kg)  Height: 4\' 8"  (1.422 m)   Body mass index is 29.53 kg/m. Physical Exam Vitals and nursing note reviewed.  Constitutional:      Appearance: Normal appearance.  HENT:     Head: Normocephalic and atraumatic.  Eyes:     Extraocular Movements: Extraocular movements intact.     Conjunctiva/sclera: Conjunctivae normal.     Pupils: Pupils are equal, round, and reactive to light.  Cardiovascular:     Rate and Rhythm: Normal rate and regular rhythm.     Heart sounds: No murmur heard.   Pulmonary:     Breath sounds: No rales.  Abdominal:     General: Bowel sounds are normal.     Palpations: Abdomen is soft.     Tenderness: There is no abdominal tenderness.  Musculoskeletal:     Cervical back: Normal range of motion and neck supple.     Right lower leg: No edema.     Left lower leg: No edema.     Comments: R shoulder limited over head ROM    Skin:    General: Skin is warm and dry.  Neurological:     General: No focal deficit present.     Mental Status: She is alert. Mental status is at baseline.     Gait: Gait abnormal.     Comments: Oriented to person  Psychiatric:        Mood and Affect: Mood normal.        Behavior: Behavior normal.        Thought Content: Thought content normal.     Labs reviewed: Recent Labs    01/07/19 0135 01/07/19 0605 01/08/19 0357 01/09/19 0335 03/24/19 0000  NA 141   < > 138 143 141  K 4.0   < > 4.8 4.1 4.3  CL 105   < > 105 109 108  CO2 23   < > 24 27 25*  GLUCOSE 180*  --  169* 108*  --   BUN 13   < > 26* 25* 20  CREATININE 0.99   < > 1.41* 1.05* 1.0  CALCIUM 9.5   < > 9.0 9.0 9.1   < > = values in this interval not displayed.   Recent Labs    03/24/19 0000  AST 20  ALT 18  ALKPHOS 57  ALBUMIN 3.5   Recent Labs    01/07/19 0135 01/07/19 0135 01/07/19 0605 01/07/19 0605 01/07/19 0737 01/08/19 0357 03/24/19 0000  WBC 12.9*   < > 7.9   < > 6.9 11.2* 7.3  NEUTROABS 7.5  --   --   --   --   --  3,329  HGB 14.0   < > 13.5   < > 13.2 12.3 13.8  HCT 45.7   < > 42.3   < > 41.2 38.7 41  MCV 103.2*   < > 94.2  --  93.4 94.4  --   PLT 213   < > 259   < > 258 235 232   < > = values in this interval not displayed.   Lab Results  Component Value Date   TSH 0.82 05/10/2019   Lab Results  Component Value  Date   HGBA1C 6.0 06/17/2018   Lab Results  Component Value Date   CHOL 211 (A) 02/14/2014   HDL 47 02/14/2014   LDLCALC 133 02/14/2014   TRIG 155 02/14/2014    Significant Diagnostic Results in last 30 days:  No results found.  Assessment/Plan Constipation stable, continue MiraLax qd.    Depression, recurrent ([place]) Her mood is stable, continue  Mirtazapine 15mg  qd, Escitalopram 20mg  qd.   Edema Not apparent, continue Furosemide.   GERD (gastroesophageal reflux disease) Stable, continue Famotidine.   Hypothyroid Stable, continue Levothyroxine.    Adult failure to thrive The patient resides in SNF [facility] for safety, care assistance, her goal of care is comfort measures, MOST no IVF/ABT.    COPD (chronic obstructive pulmonary disease) (HCC) Baseline occasional non productive cough,  stable, continue  Advair bid, prn Albuterol HFA.      Family/ staff Communication: plan of care reviewed with the patient and charge nurse.   Labs/tests ordered: none  Time spend 25 minutes

## 2019-08-13 NOTE — Assessment & Plan Note (Signed)
Not apparent, continue Furosemide 

## 2019-08-13 NOTE — Assessment & Plan Note (Signed)
stable, continue MiraLax qd.

## 2019-08-13 NOTE — Assessment & Plan Note (Signed)
Baseline occasional non productive cough,  stable, continue  Advair bid, prn Albuterol HFA.

## 2019-08-13 NOTE — Assessment & Plan Note (Signed)
The patient resides in SNF [facility] for safety, care assistance, her goal of care is comfort measures, MOST no IVF/ABT.

## 2019-08-13 NOTE — Assessment & Plan Note (Signed)
Her mood is stable, continue  Mirtazapine 15mg  qd, Escitalopram 20mg  qd.

## 2019-08-13 NOTE — Assessment & Plan Note (Signed)
Stable, continue Levothyroxine.  

## 2019-08-13 NOTE — Assessment & Plan Note (Signed)
Stable, continue Famotidine.  

## 2019-08-14 ENCOUNTER — Encounter: Payer: Self-pay | Admitting: Nurse Practitioner

## 2019-09-21 ENCOUNTER — Encounter: Payer: Self-pay | Admitting: Nurse Practitioner

## 2019-09-21 ENCOUNTER — Non-Acute Institutional Stay (SKILLED_NURSING_FACILITY): Payer: Medicare Other | Admitting: Nurse Practitioner

## 2019-09-21 DIAGNOSIS — R627 Adult failure to thrive: Secondary | ICD-10-CM | POA: Diagnosis not present

## 2019-09-21 DIAGNOSIS — F339 Major depressive disorder, recurrent, unspecified: Secondary | ICD-10-CM | POA: Diagnosis not present

## 2019-09-21 DIAGNOSIS — J42 Unspecified chronic bronchitis: Secondary | ICD-10-CM

## 2019-09-21 DIAGNOSIS — K219 Gastro-esophageal reflux disease without esophagitis: Secondary | ICD-10-CM

## 2019-09-21 DIAGNOSIS — E039 Hypothyroidism, unspecified: Secondary | ICD-10-CM

## 2019-09-21 DIAGNOSIS — K5900 Constipation, unspecified: Secondary | ICD-10-CM | POA: Diagnosis not present

## 2019-09-21 NOTE — Assessment & Plan Note (Signed)
Gradual, but persisted, regardless Mirtazapine, will update CBC/diff, CMP/eGFR, TSH

## 2019-09-21 NOTE — Assessment & Plan Note (Signed)
TSH was 1.2 03/09/19, continue Levothyroxine, update TSH in setting of weight loss.

## 2019-09-21 NOTE — Assessment & Plan Note (Signed)
stable, continue  prn Albuterol HFA, prn Tessalon, Advair bid.

## 2019-09-21 NOTE — Assessment & Plan Note (Signed)
Stable, continue MiraLax.  °

## 2019-09-21 NOTE — Assessment & Plan Note (Signed)
Her mood is stable, continue Mirtazapine, Escitalopram.  

## 2019-09-21 NOTE — Assessment & Plan Note (Signed)
Stable, continue Famotidine.  

## 2019-09-21 NOTE — Progress Notes (Addendum)
Location:    [facility] Room Number: 25 Place of Service:  SNF (31) Provider: Marlana Latus NP  ,  X, NP  Patient Care Team: ,  X, NP as PCP - General (Internal Medicine) Newton Pigg, MD as Consulting Physician (Obstetrics and Gynecology) Monna Fam, MD as Consulting Physician (Ophthalmology) Melida Quitter, MD as Consulting Physician (Otolaryngology) Myrlene Broker, MD as Consulting Physician (Urology) [place], [facility] Virgie Dad, MD as Consulting Physician (Internal Medicine)  Extended Emergency Contact Information Primary Emergency Contact: Dampier,[name] Address: [place], [place] Phone: [phone] Work Phone: [phone] Mobile Phone: [phone] Relation: Son Secondary Emergency Contact: Teofilo [place] Phone: [phone] Work Phone: [phone] Relation: Friend  Code Status:  DNR Goals of care: Advanced Directive information Advanced Directives 12/23/2019  Does Patient Have a Medical Advance Directive? Yes  Type of Paramedic of [place];Out of facility DNR (pink MOST or yellow form);Living will  Does patient want to make changes to medical advance directive? No - Patient declined  Copy of [place] in Chart? Yes - validated most recent copy scanned in chart (See row information)  Pre-existing out of facility DNR order (yellow form or pink MOST form) Yellow form placed in chart (order not valid for inpatient use);Pink MOST form placed in chart (order not valid for inpatient use)     Chief Complaint  Patient presents with   Medical agement of Chronic Issues   Health Maintenance    TDAP, Flu shot    HPI:  Pt is a 86 y.o. female seen today for medical management of chronic diseases.      The patient resides in SNF [facility] for safety, care assistance, her goal of care is comfort  measures, MOST no IVF/ABT.              Hx of constipation, stable, on MiraLax qd.              Her mood is stable, on Mirtazapine 31m qd, Escitalopram 256mqd.           Hypothyroidism, stable, on Levothyroxine 2540mqd.              COPD, stable, takes  prn Albuterol HFA, prn Tessalon, Advair bid.              GERD, stable, on Famotidine 87m87m.   Past Medical History:  Diagnosis Date   Asthma    Carpal tunnel syndrome 03/07/2009   Disturbance of skin sensation 02/21/2009   Diverticulosis of colon (without mention of hemorrhage) 02/16/2009   Fracture of upper end of left humerus 11/18/14   Hypertonicity of bladder 02/16/2009   Hypothyroid    Osteoarthrosis, unspecified whether generalized or localized, unspecified site 02/16/2009   Other abnormal blood chemistry 06/19/2010   hyperglycemia   Other and unspecified hyperlipidemia 10/24/2009   Other atopic dermatitis and related conditions 02/16/2009   Otosclerosis, unspecified 02/22/2003   Pain in limb 12/05/2009   Senile osteoporosis 06/16/2010   Sensorineural hearing loss, unspecified 02/21/2009   Syncope and collapse 05/30/2009   Unspecified nasal polyp 02/21/2009   Unspecified urinary incontinence 02/21/2009   Past Surgical History:  Procedure Laterality Date   ABDOMINAL HYSTERECTOMY  1999   Dr. S. FCollier BullockREAST BIOPSY  07/16/1990   benign left breast needle biopsy  Dr. StreMargot Chimes  CARPAL TUNNEL RELEASE  04/07/2009   right Dr. Daylene Katayama   CATARACT EXTRACTION W/ INTRAOCULAR LENS  IMPLANT, BILATERAL Bilateral    INTRAMEDULLARY (IM) NAIL INTERTROCHANTERIC Right 09/17/2017   Procedure: INTRAMEDULLARY (IM) NAIL INTERTROCHANTRIC HIP;  Surgeon: Nicholes Stairs, MD;  Location: [place];  Service: Orthopedics;  Laterality: Right;   OPEN REDUCTION INTERNAL FIXATION (ORIF) DISTAL RADIAL FRACTURE Right 09/17/2017   Procedure: OPEN REDUCTION INTERNAL FIXATION (ORIF) DISTAL RADIAL FRACTURE;  Surgeon: Iran Planas, MD;  Location: [place];  Service: Orthopedics;  Laterality: Right;   TRIGGER FINGER RELEASE Left 08/09/2014   Procedure: RELEASE TRIGGER FINGER/A-1 PULLEY LEFT MIDDLE FINGER;  Surgeon: Daryll Brod, MD;  Location: [place];  Service: Orthopedics;  Laterality: Left;  REGIONAL/FAB    Allergies  Allergen Reactions   Sulfa Antibiotics Itching, Rash and Other (See Comments)    "Allergic," per MAR   Amoxicillin Other (See Comments)    Per [facility] 09/15/17 Has patient had a PCN reaction causing immediate rash, facial/tongue/throat swelling, SOB or lightheadedness with hypotension: Unknown Has patient had a PCN reaction causing severe rash involving mucus membranes or skin necrosis: Unknown Has patient had a PCN reaction that required hospitalization: Unknown Has patient had a PCN reaction occurring within the last 10 years: Unknown If all of the above answers are "NO", then may proceed with Cephalosporin use.   Avelox [Moxifloxacin Hcl In Nacl] Itching   Doxycycline Other (See Comments)    "Allergic," per MAR   Hista-Tabs [Triprolidine-Pse] Other (See Comments)    "Passed out"   Pyrilamine-Phenylephrine Other (See Comments)    "Allergic," per [facility] 09/15/17   Septra [Sulfamethoxazole-Trimethoprim] Itching, Rash and Other (See Comments)    "Allergic," per MAR    Allergies as of 09/21/2019      Reactions   Sulfa Antibiotics Itching, Rash, Other (See Comments)   "Allergic," per MAR   Amoxicillin Other (See Comments)   Per [facility] 09/15/17 Has patient had a PCN reaction causing immediate rash, facial/tongue/throat swelling, SOB or lightheadedness with hypotension: Unknown Has patient had a PCN reaction causing severe rash involving mucus membranes or skin necrosis: Unknown Has patient had a PCN reaction that required hospitalization: Unknown Has patient had a PCN reaction occurring within the last 10 years: Unknown If all of the above answers are "NO", then may proceed with Cephalosporin use.   Avelox  [moxifloxacin Hcl In Nacl] Itching   Doxycycline Other (See Comments)   "Allergic," per MAR   Hista-tabs [triprolidine-pse] Other (See Comments)   "Passed out"   Pyrilamine-phenylephrine Other (See Comments)   "Allergic," per [facility] 09/15/17   Septra [sulfamethoxazole-trimethoprim] Itching, Rash, Other (See Comments)   "Allergic," per [facility]      Medication List       Accurate as of September 21, 2019 11:59 PM. If you have any questions, ask your nurse or doctor.        acetaminophen 325 MG tablet Commonly known as: TYLENOL Take 650 mg by mouth every 6 (six) hours as needed (for pain).   aspirin EC 81 MG tablet Take 81 mg by mouth See admin instructions. Take 81 mg by mouth in the morning on Tuesday and Saturday. 7am-11am   benzonatate 100 MG capsule Commonly known as: TESSALON Take by mouth 3 (three) times daily as needed for cough.   chlorhexidine 0.12 % solution Commonly known as: PERIDEX Use as directed 15 mLs in the mouth or throat daily.   escitalopram 20 MG tablet Commonly known as: LEXAPRO Take  20 mg by mouth daily. In the morning 7am-11am.   famotidine 20 MG tablet Commonly known as: PEPCID Take 20 mg by mouth daily. In the morning 7am-11am.   Fluticasone-Salmeterol 250-50 MCG/DOSE Aepb Commonly known as: ADVAIR Inhale 1 puff into the lungs 2 (two) times daily. Morning: 7am-11am Evening: 4pm-7pm.  Rinse mouth after use   lactose free nutrition Liqd Take 237 mLs by mouth in the morning, at noon, and at bedtime. Add 237 ml's to ice cream and consume two times a day. 10am & 4pm.   levothyroxine 25 MCG tablet Commonly known as: SYNTHROID Take 25 mcg by mouth daily before breakfast. 7:30am.   mirtazapine 15 MG tablet Commonly known as: REMERON Take 15 mg by mouth daily. 7:30pm-11pm.   OXYGEN Inhale 2 L/min into the lungs as needed (to keep O2 SATS >90%).   polyethylene glycol 17 g packet Commonly known as: MIRALAX / GLYCOLAX Take 17 g by mouth daily. Mix with  6-8 ounces of fluid of choice. Once daily 7am-11:00am.   polyvinyl alcohol 1.4 % ophthalmic solution Commonly known as: LIQUIFILM TEARS Place 1 drop into both eyes 3 (three) times daily.   PreviDent 1.1 % Gel dental gel Generic drug: sodium fluoride Place 1 application onto teeth at bedtime. brush on teeth with toothbrush after PM MOUTHCARE . SPIT OUT EXCESS AND DO NOT RINSE   raloxifene 60 MG tablet Commonly known as: EVISTA Take 60 mg by mouth daily. 4pm-7pm.   Ventolin HFA 108 (90 Base) MCG/ACT inhaler Generic drug: albuterol Inhale 2 puffs into the lungs every 6 (six) hours as needed for shortness of breath (dyspnea).   zinc oxide 20 % ointment Apply to buttocks as needed every shift after incontinent episodes       Review of Systems  Constitutional: Positive for unexpected weight change. Negative for fatigue and fever.       Gradual weight loss  HENT: Positive for hearing loss. Negative for congestion and voice change.   Eyes: Negative for visual disturbance.  Respiratory: Positive for shortness of breath. Negative for cough and wheezing.        DOE  Cardiovascular: Negative for leg swelling.  Gastrointestinal: Negative for abdominal pain and constipation.  Genitourinary: Negative for difficulty urinating, dysuria and urgency.  Musculoskeletal: Positive for arthralgias and gait problem.  Skin: Negative for color change.  Neurological: Negative for dizziness, syncope, weakness and headaches.       Memory lapses.  Psychiatric/Behavioral: Negative for behavioral problems and sleep disturbance. The patient is not nervous/anxious.     Immunization History  Administered Date(s) Administered   Influenza Whole 12/27/2011, 11/05/2012   Influenza, High Dose Seasonal PF 11/19/2018   Influenza-Unspecified 11/18/2013, 11/03/2014, 11/16/2015, 11/13/2016, 11/17/2017, 11/09/2019   Moderna SARS-COVID-2 Vaccination 02/08/2019, 03/08/2019   PPD Test 04/04/2008, 11/23/2014    Pneumococcal Conjugate-13 09/30/2016   Pneumococcal Polysaccharide-23 02/05/2008, 06/03/2008   Td 02/05/2008, 06/03/2008   Pertinent  Health Maintenance Due  Topic Date Due   INFLUENZA VACCINE  Completed   DEXA SCAN  Completed   PNA vac Low Risk Adult  Completed   Fall Risk  10/09/2017 09/23/2016 08/27/2016 05/30/2015 05/16/2015  Falls in the past year? Yes Yes Yes Yes No  Comment - - Emmi Telephone Survey: data to providers prior to load - -  Number falls in past yr: 2 or more _0 -  Comment - - Emmi Telephone Survey Actual Response = 1 - -  Injury with Fall? Yes No No Yes -  Comment broke R wrist - - - -  Risk Factor Category  - - - - -  Risk for fall due to : - - - History of fall(s);Impaired balance/gait;Impaired mobility -  Follow up - - - Falls evaluation completed;Education provided -   Functional Status Survey:    Vitals:   09/21/19 1607  BP: 139/60  Pulse: (!) 58  Resp: 20  Temp: 98.3 F (36.8 C)  SpO2: 93%  Weight: 132 lb 6.4 oz (60.1 kg)  Height: 4' 8" (1.422 m)   Body mass index is 29.68 kg/m. Physical Exam Vitals and nursing note reviewed.  Constitutional:      Appearance: Normal appearance.  HENT:     Head: Normocephalic and atraumatic.  Eyes:     Extraocular Movements: Extraocular movements intact.     Conjunctiva/sclera: Conjunctivae normal.     Pupils: Pupils are equal, round, and reactive to light.  Cardiovascular:     Rate and Rhythm: Regular rhythm. Bradycardia present.     Heart sounds: No murmur heard.   Pulmonary:     Breath sounds: No rales.  Abdominal:     General: Bowel sounds are normal.     Palpations: Abdomen is soft.     Tenderness: There is no abdominal tenderness.  Musculoskeletal:     Cervical back: Normal range of motion and neck supple.     Right lower leg: No edema.     Left lower leg: No edema.     Comments: R shoulder limited over head ROM  Skin:    General: Skin is warm and dry.  Neurological:     General: No  focal deficit present.     Mental Status: She is alert. Mental status is at baseline.     Gait: Gait abnormal.     Comments: Oriented to person  Psychiatric:        Mood and Affect: Mood normal.        Behavior: Behavior normal.        Thought Content: Thought content normal.     Comments: Followed simple directions, smiled during today's examination     Labs reviewed: Recent Labs    03/24/19 0000 06/17/19 0000 09/28/19 0000  NA 141 143 143  K 4.3 4.0 4.2  CL 108 110* 110*  CO2 25* 26* 22  BUN 20 27* 12  CREATININE 1.0 1.0 0.9  CALCIUM 9.1 9.3 8.8   Recent Labs    03/24/19 0000 06/17/19 0000 09/28/19 0000  AST _0 ALT _1 ALKPHOS 57 45 54  ALBUMIN 3.5 3.6 3.2*   Recent Labs    03/24/19 0000 06/17/19 0000 09/28/19 0000  WBC 7.3 5.4 6.9  NEUTROABS 3,329 2,117 3,064  HGB 13.8 12.9 12.9  HCT 41 39 39  PLT 232 224 195   Lab Results  Component Value Date   TSH 1.03 09/28/2019   Lab Results  Component Value Date   HGBA1C 6.0 06/17/2019   Lab Results  Component Value Date   CHOL 211 (A) 02/14/2014   HDL 47 02/14/2014   LDLCALC 133 02/14/2014   TRIG 155 02/14/2014    Significant Diagnostic Results in last 30 days:  No results found.  Assessment/Plan Adult failure to thrive Gradual, but persisted, regardless Mirtazapine, will update CBC/diff, CMP/eGFR, TSH  Constipation Stable, continue MiraLax.   Depression, recurrent ([place]) Her mood is stable, continue Mirtazapine, Escitalopram  GERD (gastroesophageal reflux disease) Stable, continue Famotidine.   Hypothyroid TSH  was 1.2 03/09/19, continue Levothyroxine, update TSH in setting of weight loss.   COPD (chronic obstructive pulmonary disease) (HCC) stable, continue  prn Albuterol HFA, prn Tessalon, Advair bid.     Family/ staff Communication: plan of care reviewed with the patient and charge nurse.   Labs/tests ordered: CBC/diff, CMP/eGFR, TSH  Time spend 25 minutes.

## 2019-09-22 ENCOUNTER — Encounter: Payer: Self-pay | Admitting: Nurse Practitioner

## 2019-09-28 LAB — CBC AND DIFFERENTIAL
HCT: 39 (ref 36–46)
Hemoglobin: 12.9 (ref 12.0–16.0)
Neutrophils Absolute: 3064
Platelets: 195 (ref 150–399)
WBC: 6.9

## 2019-09-28 LAB — HEPATIC FUNCTION PANEL
ALT: 13 (ref 7–35)
AST: 16 (ref 13–35)
Alkaline Phosphatase: 54 (ref 25–125)

## 2019-09-28 LAB — BASIC METABOLIC PANEL
BUN: 12 (ref 4–21)
CO2: 22 (ref 13–22)
Chloride: 110 — AB (ref 99–108)
Creatinine: 0.9 (ref 0.5–1.1)
Glucose: 72
Potassium: 4.2 (ref 3.4–5.3)
Sodium: 143 (ref 137–147)

## 2019-09-28 LAB — COMPREHENSIVE METABOLIC PANEL
Albumin: 3.2 — AB (ref 3.5–5.0)
Calcium: 8.8 (ref 8.7–10.7)
GFR calc Af Amer: 64
GFR calc non Af Amer: 55
Globulin: 2.6

## 2019-09-28 LAB — CBC: RBC: 4.29 (ref 3.87–5.11)

## 2019-09-28 LAB — TSH: TSH: 1.03 (ref 0.41–5.90)

## 2019-10-07 ENCOUNTER — Encounter: Payer: Self-pay | Admitting: Internal Medicine

## 2019-10-07 ENCOUNTER — Non-Acute Institutional Stay (SKILLED_NURSING_FACILITY): Payer: Medicare Other | Admitting: Internal Medicine

## 2019-10-07 DIAGNOSIS — E039 Hypothyroidism, unspecified: Secondary | ICD-10-CM

## 2019-10-07 DIAGNOSIS — K219 Gastro-esophageal reflux disease without esophagitis: Secondary | ICD-10-CM

## 2019-10-07 DIAGNOSIS — F339 Major depressive disorder, recurrent, unspecified: Secondary | ICD-10-CM

## 2019-10-07 DIAGNOSIS — J42 Unspecified chronic bronchitis: Secondary | ICD-10-CM | POA: Diagnosis not present

## 2019-10-07 DIAGNOSIS — R609 Edema, unspecified: Secondary | ICD-10-CM

## 2019-10-07 DIAGNOSIS — R001 Bradycardia, unspecified: Secondary | ICD-10-CM | POA: Diagnosis not present

## 2019-10-07 NOTE — Progress Notes (Signed)
Location:   [facility] Astronomer of Service:   SNF Provider:  Veleta Miners, MD  Mast, Man X, NP  Patient Care Team: Mast, Man X, NP as PCP - General (Internal Medicine) Newton Pigg, MD as Consulting Physician (Obstetrics and Gynecology) Monna Fam, MD as Consulting Physician (Ophthalmology) Melida Quitter, MD as Consulting Physician (Otolaryngology) Myrlene Broker, MD as Consulting Physician (Urology) [place], [facility] Virgie Dad, MD as Consulting Physician (Internal Medicine)  Extended Emergency Contact Information Primary Emergency Contact: Eckardt,[name] Address: [place], [place] Phone: [phone] Work Phone: [phone] Mobile Phone: [phone] Relation: Son Secondary Emergency Contact: Teofilo [place] of [place] Phone: [phone] Work Phone: [phone] Relation: Friend  Code Status: DNR  Goals of care: Advanced Directive information Advanced Directives 10/07/2019  Does Patient Have a Medical Advance Directive? Yes  Type of Paramedic of [place];Living will;Out of facility DNR (pink MOST or yellow form)  Does patient want to make changes to medical advance directive? No - Patient declined  Copy of [place] in Chart? Yes - validated most recent copy scanned in chart (See row information)  Pre-existing out of facility DNR order (yellow form or pink MOST form) Yellow form placed in chart (order not valid for inpatient use)     Chief Complaint  Patient presents with  . Medical Management of Chronic Issues    Routine follow up visit  . Best Practice Recommendations    Tetanus/Tdap and Flu vaccine    HPI:  Pt is a 86 y.o. female seen today for medical management of chronic diseases.   Patient is a long-term care resident Patient has h/o Sinus Bradycardia, LE edema, Hypothyroidism, Depression, Chronic Sinusitis, H/o Hip  Fracture,And Proximal Humerus Fracture due to Fall with Delayed healing Doing well in Facility Weight stable Some Episodes of Confusion Per Nurses. Family wants her to be Comfort Care. No aggressive Work up Had no Acute Complains Past Medical History:  Diagnosis Date  . Asthma   . Carpal tunnel syndrome 03/07/2009  . Disturbance of skin sensation 02/21/2009  . Diverticulosis of colon (without mention of hemorrhage) 02/16/2009  . Fracture of upper end of left humerus 11/18/14  . 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 Hypothyroid   . Osteoarthrosis, unspecified whether generalized or localized, unspecified site 02/16/2009  . Other abnormal blood chemistry 06/19/2010   hyperglycemia  . Other and unspecified hyperlipidemia 10/24/2009  . Other atopic dermatitis and related conditions 02/16/2009  . Otosclerosis, unspecified 02/22/2003  . Pain in limb 12/05/2009  . Senile osteoporosis 06/16/2010  . Sensorineural hearing loss, unspecified 02/21/2009  . Syncope and collapse 05/30/2009  . Unspecified nasal polyp 02/21/2009  . Unspecified urinary incontinence 02/21/2009   Past Surgical History:  Procedure Laterality Date  . ABDOMINAL HYSTERECTOMY  1999   Dr. Collier Bullock  . BREAST BIOPSY  07/16/1990   benign left breast needle biopsy  Dr. Margot Chimes  . CARPAL TUNNEL RELEASE  04/07/2009   right Dr. Daylene Katayama  . CATARACT EXTRACTION W/ INTRAOCULAR LENS  IMPLANT, BILATERAL Bilateral   . INTRAMEDULLARY (IM) NAIL INTERTROCHANTERIC Right 09/17/2017   Procedure: INTRAMEDULLARY (IM) NAIL INTERTROCHANTRIC HIP;  Surgeon: Nicholes Stairs, MD;  Location: [facility];  Service: Orthopedics;  Laterality: Right;  . OPEN REDUCTION INTERNAL FIXATION (ORIF) DISTAL RADIAL FRACTURE Right 09/17/2017   Procedure: OPEN REDUCTION INTERNAL FIXATION (ORIF) DISTAL RADIAL FRACTURE;  Surgeon: Iran Planas, MD;  Location: [facility];  Service: Orthopedics;  Laterality: Right;  . TRIGGER FINGER RELEASE Left 08/09/2014   Procedure: RELEASE TRIGGER  FINGER/A-1 PULLEY LEFT MIDDLE FINGER;  Surgeon: Daryll Brod, MD;  Location: [facility];  Service: Orthopedics;  Laterality: Left;  REGIONAL/FAB    Allergies  Allergen Reactions  . Sulfa Antibiotics Itching, Rash and Other (See Comments)    "Allergic," per MAR  . Amoxicillin Other (See Comments)    Per [facility] 09/15/17 Has patient had a PCN reaction causing immediate rash, facial/tongue/throat swelling, SOB or lightheadedness with hypotension: Unknown Has patient had a PCN reaction causing severe rash involving mucus membranes or skin necrosis: Unknown Has patient had a PCN reaction that required hospitalization: Unknown Has patient had a PCN reaction occurring within the last 10 years: Unknown If all of the above answers are "NO", then may proceed with Cephalosporin use.  . Avelox [Moxifloxacin Hcl In Nacl] Itching  . Doxycycline Other (See Comments)    "Allergic," per MAR  . Hista-Tabs [Triprolidine-Pse] Other (See Comments)    "Passed out"  . Pyrilamine-Phenylephrine Other (See Comments)    "Allergic," per [facility] 09/15/17  . Septra [Sulfamethoxazole-Trimethoprim] Itching, Rash and Other (See Comments)    "Allergic," per MAR    Allergies as of 10/07/2019      Reactions   Sulfa Antibiotics Itching, Rash, Other (See Comments)   "Allergic," per MAR   Amoxicillin Other (See Comments)   Per [facility] 09/15/17 Has patient had a PCN reaction causing immediate rash, facial/tongue/throat swelling, SOB or lightheadedness with hypotension: Unknown Has patient had a PCN reaction causing severe rash involving mucus membranes or skin necrosis: Unknown Has patient had a PCN reaction that required hospitalization: Unknown Has patient had a PCN reaction occurring within the last 10 years: Unknown If all of the above answers are "NO", then may proceed with Cephalosporin use.   Avelox [moxifloxacin Hcl In Nacl] Itching   Doxycycline Other (See Comments)   "Allergic," per MAR   Hista-tabs  [triprolidine-pse] Other (See Comments)   "Passed out"   Pyrilamine-phenylephrine Other (See Comments)   "Allergic," per [facility] 09/15/17   Septra [sulfamethoxazole-trimethoprim] Itching, Rash, Other (See Comments)   "Allergic," per [facility] [place]      Medication List       Accurate as of October 07, 2019 11:09 AM. If you have any questions, ask your nurse or doctor.        acetaminophen 325 MG tablet Commonly known as: TYLENOL Take 650 mg by mouth every 6 (six) hours as needed (for pain).   aspirin EC 81 MG tablet Take 81 mg by mouth See admin instructions. Take 81 mg by mouth in the morning on Tuesday and Saturday. 7am-11am   benzonatate 100 MG capsule Commonly known as: TESSALON Take by mouth 3 (three) times daily as needed for cough.   chlorhexidine 0.12 % solution Commonly known as: PERIDEX Use as directed 15 mLs in the mouth or throat daily.   escitalopram 20 MG tablet Commonly known as: LEXAPRO Take 20 mg by mouth daily. In the morning 7am-11am.   famotidine 20 MG tablet Commonly known as: PEPCID Take 20 mg by mouth daily. In the morning 7am-11am.   Fluticasone-Salmeterol 250-50 MCG/DOSE Aepb Commonly known as: ADVAIR Inhale 1 puff into the lungs 2 (two) times daily. Morning: 7am-11am Evening: 4pm-7pm.  Rinse mouth after use   lactose free nutrition Liqd Take 237 mLs by mouth See admin instructions. Add 237 ml's to ice cream  and consume two times a day. 10am & 4pm.   levothyroxine 25 MCG tablet Commonly known as: SYNTHROID Take 25 mcg by mouth daily before breakfast. 7:30am.   mirtazapine 15 MG tablet Commonly known as: REMERON Take 15 mg by mouth daily. 7:30pm-11pm.   OXYGEN Inhale 2 L/min into the lungs as needed (to keep O2 SATS >90%).   polyethylene glycol 17 g packet Commonly known as: MIRALAX / GLYCOLAX Take 17 g by mouth daily. Mix with 6-8 ounces of fluid of choice. Once daily 7am-11:00am.   polyvinyl alcohol 1.4 % ophthalmic solution Commonly known as:  LIQUIFILM TEARS Place 1 drop into both eyes 3 (three) times daily.   PreviDent 1.1 % Gel dental gel Generic drug: sodium fluoride Place 1 application onto teeth at bedtime. brush on teeth with toothbrush after PM MOUTHCARE . SPIT OUT EXCESS AND DO NOT RINSE   raloxifene 60 MG tablet Commonly known as: EVISTA Take 60 mg by mouth daily. 4pm-7pm.   Ventolin HFA 108 (90 Base) MCG/ACT inhaler Generic drug: albuterol Inhale 2 puffs into the lungs every 6 (six) hours as needed for shortness of breath (dyspnea).   zinc oxide 20 % ointment Apply to buttocks as needed every shift after incontinent episodes       Review of Systems  Review of Systems  Constitutional: Negative for activity change, appetite change, chills, diaphoresis, fatigue and fever.  HENT: Negative for mouth sores, postnasal drip, rhinorrhea, sinus pain and sore throat.   Respiratory: Negative for apnea, cough, chest tightness, shortness of breath and wheezing.   Cardiovascular: Negative for chest pain, palpitations and leg swelling.  Gastrointestinal: Negative for abdominal distention, abdominal pain, constipation, diarrhea, nausea and vomiting.  Genitourinary: Negative for dysuria and frequency.  Musculoskeletal: Negative for arthralgias, joint swelling and myalgias.  Skin: Negative for rash.  Neurological: Negative for dizziness, syncope, weakness, light-headedness and numbness.  Psychiatric/Behavioral: Negative for behavioral problems, confusion and sleep disturbance.     Immunization History  Administered Date(s) Administered  . Influenza Whole 12/27/2011, 11/05/2012  . Influenza, High Dose Seasonal PF 11/19/2018  . Influenza-Unspecified 11/18/2013, 11/03/2014, 11/16/2015, 11/13/2016, 11/17/2017  . Moderna SARS-COVID-2 Vaccination 02/08/2019, 03/08/2019  . PPD Test 04/04/2008, 11/23/2014  . Pneumococcal Conjugate-13 09/30/2016  . Pneumococcal Polysaccharide-23 02/05/2008, 06/03/2008  . Td 02/05/2008,  06/03/2008   Pertinent  Health Maintenance Due  Topic Date Due  . INFLUENZA VACCINE  09/05/2019  . DEXA SCAN  Completed  . PNA vac Low Risk Adult  Completed   Fall Risk  10/09/2017 09/23/2016 08/27/2016 05/30/2015 05/16/2015  Falls in the past year? Yes Yes Yes Yes No  Comment - - Emmi Telephone Survey: data to providers prior to load - -  Number falls in past yr: 2 or more 1 1 1  -  Comment - - Emmi Telephone Survey Actual Response = 1 - -  Injury with Fall? Yes No No Yes -  Comment broke R wrist - - - -  Risk Factor Category  - - - - -  Risk for fall due to : - - - History of fall(s);Impaired balance/gait;Impaired mobility -  Follow up - - - Falls evaluation completed;Education provided -   Functional Status Survey:    Vitals:   10/07/19 1048  BP: 113/68  Pulse: 60  Resp: 18  Temp: (!) 97.5 F (36.4 C)  SpO2: 94%  Weight: 132 lb 6.4 oz (60.1 kg)  Height: 4\' 8"  (1.422 m)   Body mass index is 29.68 kg/m. Physical Exam  Constitutional: . Well-developed and well-nourished.  HENT:  Head: Normocephalic.  Mouth/Throat: Oropharynx is clear and moist.  Eyes: Pupils are equal, round, and reactive to light.  Neck: Neck supple.  Cardiovascular: Normal rate and normal heart sounds.  No murmur heard. Pulmonary/Chest: Effort normal and breath sounds normal. No respiratory distress. No wheezes. She has no rales.  Abdominal: Soft. Bowel sounds are normal. No distension. There is no tenderness. There is no rebound.  Musculoskeletal: Mild Edema Bilateral Lymphadenopathy: none Neurological: Alert Answers Appropriately Wheelchair Depedent.  Skin: Skin is warm and dry.  Psychiatric: Normal mood and affect. Behavior is normal. Thought content normal.    Labs reviewed: Recent Labs    01/07/19 0135 01/07/19 0605 01/08/19 0357 01/08/19 0357 01/09/19 0335 03/24/19 0000 06/17/19 0000  NA 141   < > 138   < > 143 141 143  K 4.0   < > 4.8   < > 4.1 4.3 4.0  CL 105   < > 105   < > 109  108 110*  CO2 23   < > 24   < > 27 25* 26*  GLUCOSE 180*  --  169*  --  108*  --   --   BUN 13   < > 26*   < > 25* 20 27*  CREATININE 0.99   < > 1.41*   < > 1.05* 1.0 1.0  CALCIUM 9.5   < > 9.0   < > 9.0 9.1 9.3   < > = values in this interval not displayed.   Recent Labs    03/24/19 0000 06/17/19 0000  AST 20 14  ALT 18 11  ALKPHOS 57 45  ALBUMIN 3.5 3.6   Recent Labs    01/07/19 0135 01/07/19 0135 01/07/19 0605 01/07/19 0605 01/07/19 0737 01/07/19 0737 01/08/19 0357 03/24/19 0000 06/17/19 0000  WBC 12.9*   < > 7.9   < > 6.9   < > 11.2* 7.3 5.4  NEUTROABS 7.5  --   --   --   --   --   --  3,329 2,117  HGB 14.0   < > 13.5   < > 13.2   < > 12.3 13.8 12.9  HCT 45.7   < > 42.3   < > 41.2   < > 38.7 41 39  MCV 103.2*   < > 94.2  --  93.4  --  94.4  --   --   PLT 213   < > 259   < > 258   < > 235 232 224   < > = values in this interval not displayed.   Lab Results  Component Value Date   TSH 0.82 05/10/2019   Lab Results  Component Value Date   HGBA1C 6.0 06/17/2019   Lab Results  Component Value Date   CHOL 211 (A) 02/14/2014   HDL 47 02/14/2014   LDLCALC 133 02/14/2014   TRIG 155 02/14/2014    Significant Diagnostic Results in last 30 days:  No results found.  Assessment/Plan  LE Edema, unspecified type Off lasix for now Acquired hypothyroidism TSH normal in 4/21 Chronic bronchitis, \ Stable on Advair and Ventolin Sinus bradycardia Stable Continues to have Low HR Depression, recurrent (HCC) On Remeron and Lexapro Weight stable Gastroesophageal reflux disease without esophagitis On Pepcid Osteoporosis On Evista  Family/ staff Communication:   Labs/tests ordered:

## 2019-11-12 ENCOUNTER — Encounter: Payer: Self-pay | Admitting: Nurse Practitioner

## 2019-11-12 ENCOUNTER — Non-Acute Institutional Stay (SKILLED_NURSING_FACILITY): Payer: Medicare Other | Admitting: Nurse Practitioner

## 2019-11-12 DIAGNOSIS — R4189 Other symptoms and signs involving cognitive functions and awareness: Secondary | ICD-10-CM

## 2019-11-12 DIAGNOSIS — F339 Major depressive disorder, recurrent, unspecified: Secondary | ICD-10-CM

## 2019-11-12 DIAGNOSIS — K219 Gastro-esophageal reflux disease without esophagitis: Secondary | ICD-10-CM

## 2019-11-12 DIAGNOSIS — E039 Hypothyroidism, unspecified: Secondary | ICD-10-CM | POA: Diagnosis not present

## 2019-11-12 DIAGNOSIS — J42 Unspecified chronic bronchitis: Secondary | ICD-10-CM | POA: Diagnosis not present

## 2019-11-12 NOTE — Progress Notes (Signed)
Location:    [facility] Room Number: 25 Place of Service:  SNF (31) Provider:  Marlana Latus Greer  Kimberly Taitt Greer, Greer  Patient Care Team: Kimberly Mckamey Greer, Greer as PCP - General (Internal Medicine) Newton Pigg, MD as Consulting Physician (Obstetrics and Gynecology) Monna Fam, MD as Consulting Physician (Ophthalmology) Melida Quitter, MD as Consulting Physician (Otolaryngology) Myrlene Broker, MD as Consulting Physician (Urology) [facility], [facility] Virgie Dad, MD as Consulting Physician (Internal Medicine)  Extended Emergency Contact Information Primary Emergency Contact: Kimberly Greer Address: [place], [place] Phone: [phone] Work Phone: [phone] Mobile Phone: [phone] Relation: Son Secondary Emergency Contact: Kimberly Greer [place] Phone: [phone] Work Phone: [phone] Relation: Friend  Code Status:  DNR Goals of care: Advanced Directive information Advanced Directives 11/12/2019  Does Patient Have a Medical Advance Directive? Yes  Type of Advance Directive Out of facility DNR (pink MOST or yellow form);Living will;Healthcare Power of Attorney  Does patient want to make changes to medical advance directive? No - Patient declined  Copy of [place] in Chart? Yes - validated most recent copy scanned in chart (See row information)  Pre-existing out of facility DNR order (yellow form or pink MOST form) Pink MOST form placed in chart (order not valid for inpatient use);Yellow form placed in chart (order not valid for inpatient use)     Chief Complaint  Patient presents with  . Medical Management of Chronic Issues  . Health Maintenance    TDAP    HPI:  Pt is a 86 y.o. female seen today for medical management of chronic diseases.    Dementia, her goal of care is comfort measures, MOST no IVF/ABT.  Hx of constipation,  stable, on MiraLax qd.  Her mood is stable, on Mirtazapine 15mg  qd, Escitalopram 20mg  qd.              Hypothyroidism, stable, on Levothyroxine 54mcg qd. TSH 1.03 09/28/19 COPD, stable, takes  prn Albuterol HFA, prn Tessalon, Advair bid.  GERD, stable, on Famotidine 20mg  qd.    Past Medical History:  Diagnosis Date  . Asthma   . Carpal tunnel syndrome 03/07/2009  . Disturbance of skin sensation 02/21/2009  . Diverticulosis of colon (without mention of hemorrhage) 02/16/2009  . Fracture of upper end of left humerus 11/18/14  . 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 Hypothyroid   . Osteoarthrosis, unspecified whether generalized or localized, unspecified site 02/16/2009  . Other abnormal blood chemistry 06/19/2010   hyperglycemia  . Other and unspecified hyperlipidemia 10/24/2009  . Other atopic dermatitis and related conditions 02/16/2009  . Otosclerosis, unspecified 02/22/2003  . Pain in limb 12/05/2009  . Senile osteoporosis 06/16/2010  . Sensorineural hearing loss, unspecified 02/21/2009  . Syncope and collapse 05/30/2009  . Unspecified nasal polyp 02/21/2009  . Unspecified urinary incontinence 02/21/2009   Past Surgical History:  Procedure Laterality Date  . ABDOMINAL HYSTERECTOMY  1999   Dr. Collier Bullock  . BREAST BIOPSY  07/16/1990   benign left breast needle biopsy  Dr. Margot Chimes  . CARPAL TUNNEL RELEASE  04/07/2009   right Dr. Daylene Katayama  . CATARACT EXTRACTION W/ INTRAOCULAR LENS  IMPLANT, BILATERAL Bilateral   . INTRAMEDULLARY (IM) NAIL INTERTROCHANTERIC Right 09/17/2017   Procedure: INTRAMEDULLARY (IM) NAIL INTERTROCHANTRIC HIP;  Surgeon: Nicholes Stairs, MD;  Location: [place];  Service: Orthopedics;  Laterality:  Right;  Marland Kitchen OPEN REDUCTION INTERNAL FIXATION (ORIF) DISTAL RADIAL FRACTURE Right 09/17/2017   Procedure: OPEN REDUCTION INTERNAL FIXATION (ORIF) DISTAL RADIAL FRACTURE;  Surgeon: Iran Planas, MD;  Location: [facility];  Service: Orthopedics;   Laterality: Right;  . TRIGGER FINGER RELEASE Left 08/09/2014   Procedure: RELEASE TRIGGER FINGER/A-1 PULLEY LEFT MIDDLE FINGER;  Surgeon: Daryll Brod, MD;  Location: [facility];  Service: Orthopedics;  Laterality: Left;  REGIONAL/FAB    Allergies  Allergen Reactions  . Sulfa Antibiotics Itching, Rash and Other (See Comments)    "Allergic," per MAR  . Amoxicillin Other (See Comments)    Per [facility] 09/15/17 Has patient had a PCN reaction causing immediate rash, facial/tongue/throat swelling, SOB or lightheadedness with hypotension: Unknown Has patient had a PCN reaction causing severe rash involving mucus membranes or skin necrosis: Unknown Has patient had a PCN reaction that required hospitalization: Unknown Has patient had a PCN reaction occurring within the last 10 years: Unknown If all of the above answers are "NO", then may proceed with Cephalosporin use.  . Avelox [Moxifloxacin Hcl In Nacl] Itching  . Doxycycline Other (See Comments)    "Allergic," per MAR  . Hista-Tabs [Triprolidine-Pse] Other (See Comments)    "Passed out"  . Pyrilamine-Phenylephrine Other (See Comments)    "Allergic," per [facility] 09/15/17  . Septra [Sulfamethoxazole-Trimethoprim] Itching, Rash and Other (See Comments)    "Allergic," per MAR    Allergies as of 11/12/2019      Reactions   Sulfa Antibiotics Itching, Rash, Other (See Comments)   "Allergic," per MAR   Amoxicillin Other (See Comments)   Per [facility] 09/15/17 Has patient had a PCN reaction causing immediate rash, facial/tongue/throat swelling, SOB or lightheadedness with hypotension: Unknown Has patient had a PCN reaction causing severe rash involving mucus membranes or skin necrosis: Unknown Has patient had a PCN reaction that required hospitalization: Unknown Has patient had a PCN reaction occurring within the last 10 years: Unknown If all of the above answers are "NO", then may proceed with Cephalosporin use.   Avelox [moxifloxacin Hcl In Nacl]  Itching   Doxycycline Other (See Comments)   "Allergic," per MAR   Hista-tabs [triprolidine-pse] Other (See Comments)   "Passed out"   Pyrilamine-phenylephrine Other (See Comments)   "Allergic," per [facility] 09/15/17   Septra [sulfamethoxazole-trimethoprim] Itching, Rash, Other (See Comments)   "Allergic," per [facility]      Medication List       Accurate as of November 12, 2019 11:59 PM. If you have any questions, ask your nurse or doctor.        acetaminophen 325 MG tablet Commonly known as: TYLENOL Take 650 mg by mouth every 6 (six) hours as needed (for pain).   aspirin EC 81 MG tablet Take 81 mg by mouth See admin instructions. Take 81 mg by mouth in the morning on Tuesday and Saturday. 7am-11am   benzonatate 100 MG capsule Commonly known as: TESSALON Take by mouth 3 (three) times daily as needed for cough.   chlorhexidine 0.12 % solution Commonly known as: PERIDEX Use as directed 15 mLs in the mouth or throat daily.   escitalopram 20 MG tablet Commonly known as: LEXAPRO Take 20 mg by mouth daily. In the morning 7am-11am.   famotidine 20 MG tablet Commonly known as: PEPCID Take 20 mg by mouth daily. In the morning 7am-11am.   Fluticasone-Salmeterol 250-50 MCG/DOSE Aepb Commonly known as: ADVAIR Inhale 1 puff into the lungs 2 (two) times daily. Morning: 7am-11am Evening: 4pm-7pm.  Rinse mouth after use   lactose free nutrition Liqd Take 237 mLs by mouth See admin instructions. Add 237 ml's to ice cream and consume two times a day. 10am & 4pm.   levothyroxine 25 MCG tablet Commonly known as: SYNTHROID Take 25 mcg by mouth daily before breakfast. 7:30am.   mirtazapine 15 MG tablet Commonly known as: REMERON Take 15 mg by mouth daily. 7:30pm-11pm.   OXYGEN Inhale 2 L/min into the lungs as needed (to keep O2 SATS >90%).   polyethylene glycol 17 g packet Commonly known as: MIRALAX / GLYCOLAX Take 17 g by mouth daily. Mix with 6-8 ounces of fluid of choice. Once daily  7am-11:00am.   polyvinyl alcohol 1.4 % ophthalmic solution Commonly known as: LIQUIFILM TEARS Place 1 drop into both eyes 3 (three) times daily.   PreviDent 1.1 % Gel dental gel Generic drug: sodium fluoride Place 1 application onto teeth at bedtime. brush on teeth with toothbrush after PM MOUTHCARE . SPIT OUT EXCESS AND DO NOT RINSE   raloxifene 60 MG tablet Commonly known as: EVISTA Take 60 mg by mouth daily. 4pm-7pm.   Ventolin HFA 108 (90 Base) MCG/ACT inhaler Generic drug: albuterol Inhale 2 puffs into the lungs every 6 (six) hours as needed for shortness of breath (dyspnea).   zinc oxide 20 % ointment Apply to buttocks as needed every shift after incontinent episodes       Review of Systems  Constitutional: Positive for unexpected weight change. Negative for activity change and fever.       Gradual weight loss  HENT: Positive for hearing loss. Negative for congestion and voice change.   Eyes: Negative for visual disturbance.  Respiratory: Positive for shortness of breath. Negative for cough and wheezing.        DOE  Cardiovascular: Negative for leg swelling.  Gastrointestinal: Negative for abdominal pain and constipation.  Genitourinary: Negative for difficulty urinating, dysuria and urgency.  Musculoskeletal: Positive for arthralgias and gait problem.  Skin: Negative for color change.  Neurological: Negative for syncope, weakness and light-headedness.       Memory lapses.  Psychiatric/Behavioral: Negative for agitation and sleep disturbance. The patient is not nervous/anxious.     Immunization History  Administered Date(s) Administered  . Influenza Whole 12/27/2011, 11/05/2012  . Influenza, High Dose Seasonal PF 11/19/2018  . Influenza-Unspecified 11/18/2013, 11/03/2014, 11/16/2015, 11/13/2016, 11/17/2017  . Moderna SARS-COVID-2 Vaccination 02/08/2019, 03/08/2019  . PPD Test 04/04/2008, 11/23/2014  . Pneumococcal Conjugate-13 09/30/2016  . Pneumococcal  Polysaccharide-23 02/05/2008, 06/03/2008  . Td 02/05/2008, 06/03/2008   Pertinent  Health Maintenance Due  Topic Date Due  . INFLUENZA VACCINE  09/05/2019  . DEXA SCAN  Completed  . PNA vac Low Risk Adult  Completed   Fall Risk  10/09/2017 09/23/2016 08/27/2016 05/30/2015 05/16/2015  Falls in the past year? Yes Yes Yes Yes No  Comment - - Emmi Telephone Survey: data to providers prior to load - -  Number falls in past yr: 2 or more 1 1 1  -  Comment - - Emmi Telephone Survey Actual Response = 1 - -  Injury with Fall? Yes No No Yes -  Comment broke R wrist - - - -  Risk Factor Category  - - - - -  Risk for fall due to : - - - History of fall(s);Impaired balance/gait;Impaired mobility -  Follow up - - - Falls evaluation completed;Education provided -   Functional Status Survey:    Vitals:   11/12/19 0932  BP: 125/68  Pulse: (!) 58  Resp: 18  Temp: (!) 97.4 F (36.3 C)  SpO2: 94%  Weight: 129 lb 6.4 oz (58.7 kg)  Height: 4\' 8"  (1.422 m)   Body mass index is 29.01 kg/m. Physical Exam Vitals and nursing note reviewed.  Constitutional:      Appearance: Normal appearance.  HENT:     Head: Normocephalic and atraumatic.  Eyes:     Extraocular Movements: Extraocular movements intact.     Conjunctiva/sclera: Conjunctivae normal.     Pupils: Pupils are equal, round, and reactive to light.  Cardiovascular:     Rate and Rhythm: Normal rate and regular rhythm.     Heart sounds: No murmur heard.   Pulmonary:     Breath sounds: No rales.  Abdominal:     General: Bowel sounds are normal.     Palpations: Abdomen is soft.     Tenderness: There is no abdominal tenderness.  Musculoskeletal:     Cervical back: Normal range of motion and neck supple.     Right lower leg: No edema.     Left lower leg: No edema.     Comments: R shoulder limited over head ROM  Skin:    General: Skin is warm and dry.  Neurological:     General: No focal deficit present.     Mental Status: She is alert.  Mental status is at baseline.     Gait: Gait abnormal.     Comments: Oriented to person  Psychiatric:        Mood and Affect: Mood normal.        Behavior: Behavior normal.        Thought Content: Thought content normal.     Comments: Followed simple directions, smiled during today's examination     Labs reviewed: Recent Labs    01/07/19 0135 01/07/19 0605 01/08/19 0357 01/08/19 0357 01/09/19 0335 01/09/19 0335 03/24/19 0000 06/17/19 0000 09/28/19 0000  NA 141   < > 138   < > 143  --  141 143 143  K 4.0   < > 4.8   < > 4.1   < > 4.3 4.0 4.2  CL 105   < > 105   < > 109   < > 108 110* 110*  CO2 23   < > 24   < > 27   < > 25* 26* 22  GLUCOSE 180*  --  169*  --  108*  --   --   --   --   BUN 13   < > 26*   < > 25*  --  20 27* 12  CREATININE 0.99   < > 1.41*   < > 1.05*  --  1.0 1.0 0.9  CALCIUM 9.5   < > 9.0   < > 9.0   < > 9.1 9.3 8.8   < > = values in this interval not displayed.   Recent Labs    03/24/19 0000 06/17/19 0000 09/28/19 0000  AST 20 14 16   ALT 18 11 13   ALKPHOS 57 45 54  ALBUMIN 3.5 3.6 3.2*   Recent Labs    01/07/19 0605 01/07/19 0605 01/07/19 0737 01/07/19 0737 01/08/19 0357 01/08/19 0357 03/24/19 0000 06/17/19 0000 09/28/19 0000  WBC 7.9   < > 6.9   < > 11.2*  --  7.3 5.4 6.9  NEUTROABS  --   --   --   --   --   --  3,329 2,117 3,064  HGB 13.5   < > 13.2   < > 12.3   < > 13.8 12.9 12.9  HCT 42.3   < > 41.2   < > 38.7   < > 41 39 39  MCV 94.2  --  93.4  --  94.4  --   --   --   --   PLT 259   < > 258   < > 235   < > 232 224 195   < > = values in this interval not displayed.   Lab Results  Component Value Date   TSH 1.03 09/28/2019   Lab Results  Component Value Date   HGBA1C 6.0 06/17/2019   Lab Results  Component Value Date   CHOL 211 (A) 02/14/2014   HDL 47 02/14/2014   LDLCALC 133 02/14/2014   TRIG 155 02/14/2014    Significant Diagnostic Results in last 30 days:  No results found.  Assessment/Plan There are no  diagnoses linked to this encounter.   Family/ staff Communication:   Labs/tests ordered:

## 2019-11-12 NOTE — Assessment & Plan Note (Signed)
stable, on Famotidine 20mg qd. 

## 2019-11-12 NOTE — Assessment & Plan Note (Signed)
Her mood is stable, on Mirtazapine 15mg qd, Escitalopram 20mg qd.  

## 2019-11-12 NOTE — Assessment & Plan Note (Signed)
Dementia, her goal of care is comfort measures, MOST no IVF/ABT. Contributory to gradual weight loss, continue supportive care in SNF [facility]

## 2019-11-12 NOTE — Assessment & Plan Note (Signed)
Stable, continue Levothyroxine. TSH 1.03 09/28/19

## 2019-11-12 NOTE — Assessment & Plan Note (Signed)
stable, takes  prn Albuterol HFA, prn Tessalon, Advair bid.

## 2019-12-03 ENCOUNTER — Non-Acute Institutional Stay (SKILLED_NURSING_FACILITY): Payer: Medicare Other | Admitting: Nurse Practitioner

## 2019-12-03 ENCOUNTER — Encounter: Payer: Self-pay | Admitting: Nurse Practitioner

## 2019-12-03 DIAGNOSIS — K5900 Constipation, unspecified: Secondary | ICD-10-CM

## 2019-12-03 DIAGNOSIS — K219 Gastro-esophageal reflux disease without esophagitis: Secondary | ICD-10-CM

## 2019-12-03 DIAGNOSIS — R4189 Other symptoms and signs involving cognitive functions and awareness: Secondary | ICD-10-CM

## 2019-12-03 DIAGNOSIS — R627 Adult failure to thrive: Secondary | ICD-10-CM | POA: Diagnosis not present

## 2019-12-03 DIAGNOSIS — F339 Major depressive disorder, recurrent, unspecified: Secondary | ICD-10-CM

## 2019-12-03 DIAGNOSIS — J42 Unspecified chronic bronchitis: Secondary | ICD-10-CM | POA: Diagnosis not present

## 2019-12-03 DIAGNOSIS — E039 Hypothyroidism, unspecified: Secondary | ICD-10-CM

## 2019-12-03 NOTE — Assessment & Plan Note (Signed)
Persisted gradual weight loss, dietary increased boost plus tid with meals, already taking Mirtazapine.

## 2019-12-03 NOTE — Assessment & Plan Note (Signed)
Hypothyroidism, stable, on Levothyroxine 47mcg qd. TSH 1.03 09/28/19

## 2019-12-03 NOTE — Assessment & Plan Note (Signed)
Dementia, her goal of care is comfort measures, MOST no IVF/ABT.

## 2019-12-03 NOTE — Assessment & Plan Note (Signed)
Stable, continue Famotidine 20mg qd.  

## 2019-12-03 NOTE — Assessment & Plan Note (Signed)
Her mood is stable, on Mirtazapine 15mg qd, Escitalopram 20mg qd.  

## 2019-12-03 NOTE — Assessment & Plan Note (Signed)
Hx of constipation, stable, on MiraLax qd.

## 2019-12-03 NOTE — Progress Notes (Signed)
Location:    [facility] Room Number: 25 Place of Service:  SNF (31) Provider: Marlana Latus NP  Ardis Lawley X, NP  Patient Care Team: Radie Berges X, NP as PCP - General (Internal Medicine) Newton Pigg, MD as Consulting Physician (Obstetrics and Gynecology) Monna Fam, MD as Consulting Physician (Ophthalmology) Melida Quitter, MD as Consulting Physician (Otolaryngology) Myrlene Broker, MD as Consulting Physician (Urology) [place] Virgie Dad, MD as Consulting Physician (Internal Medicine)  Extended Emergency Contact Information Primary Emergency Contact: Mcconaha,[name] Address: [place], [place] Phone: [phone] Work Phone: [phone] Mobile Phone: [phone] Relation: Son Secondary Emergency Contact: Teofilo [place] Phone: [phone] Work Phone: [phone] Relation: Friend  Code Status:  DNR Goals of care: Advanced Directive information Advanced Directives 11/12/2019  Does Patient Have a Medical Advance Directive? Yes  Type of Advance Directive Out of facility DNR (pink MOST or yellow form);Living will;Healthcare Power of Attorney  Does patient want to make changes to medical advance directive? No - Patient declined  Copy of [place] in Chart? Yes - validated most recent copy scanned in chart (See row information)  Pre-existing out of facility DNR order (yellow form or pink MOST form) Pink MOST form placed in chart (order not valid for inpatient use);Yellow form placed in chart (order not valid for inpatient use)     Chief Complaint  Patient presents with  . Acute Visit    Fever, cough    HPI:  Pt is a 86 y.o. female seen today for an acute visit for c/o fever, cough, congestion. MOST comfort measures, no IVF/ABT. Prn Tylenol and Albuterol HFA, Tessalon available to her, also takes Advair bid.  Persisted gradual  weight loss, dietary increased boost plus tid with meals, already taking Mirtazapine.   Dementia, her goal of care is comfort measures, MOST no IVF/ABT.  Hx of constipation, stable, on MiraLax qd.  Her mood is stable, on Mirtazapine 15mg  qd, Escitalopram 20mg  qd.  Hypothyroidism, stable, on Levothyroxine 30mcg qd. TSH 1.03 09/28/19 COPD, stable,takesprn Albuterol HFA, prn Tessalon, Advair bid. GERD, stable, on Famotidine 20mg  qd.     Past Medical History:  Diagnosis Date  . Asthma   . Carpal tunnel syndrome 03/07/2009  . Disturbance of skin sensation 02/21/2009  . Diverticulosis of colon (without mention of hemorrhage) 02/16/2009  . Fracture of upper end of left humerus 11/18/14  . 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 Hypothyroid   . Osteoarthrosis, unspecified whether generalized or localized, unspecified site 02/16/2009  . Other abnormal blood chemistry 06/19/2010   hyperglycemia  . Other and unspecified hyperlipidemia 10/24/2009  . Other atopic dermatitis and related conditions 02/16/2009  . Otosclerosis, unspecified 02/22/2003  . Pain in limb 12/05/2009  . Senile osteoporosis 06/16/2010  . Sensorineural hearing loss, unspecified 02/21/2009  . Syncope and collapse 05/30/2009  . Unspecified nasal polyp 02/21/2009  . Unspecified urinary incontinence 02/21/2009   Past Surgical History:  Procedure Laterality Date  . ABDOMINAL HYSTERECTOMY  1999   Dr. Collier Bullock  . BREAST BIOPSY  07/16/1990   benign left breast needle biopsy  Dr. Margot Chimes  . CARPAL TUNNEL RELEASE  04/07/2009   right Dr. Daylene Katayama  . CATARACT EXTRACTION W/ INTRAOCULAR LENS  IMPLANT, BILATERAL Bilateral   . INTRAMEDULLARY (IM) NAIL INTERTROCHANTERIC Right 09/17/2017   Procedure: INTRAMEDULLARY (IM) NAIL INTERTROCHANTRIC HIP;  Surgeon:  Nicholes Stairs, MD;  Location: [facility];  Service: Orthopedics;  Laterality: Right;  . OPEN REDUCTION INTERNAL FIXATION (ORIF)  DISTAL RADIAL FRACTURE Right 09/17/2017   Procedure: OPEN REDUCTION INTERNAL FIXATION (ORIF) DISTAL RADIAL FRACTURE;  Surgeon: Iran Planas, MD;  Location: [facility];  Service: Orthopedics;  Laterality: Right;  . TRIGGER FINGER RELEASE Left 08/09/2014   Procedure: RELEASE TRIGGER FINGER/A-1 PULLEY LEFT MIDDLE FINGER;  Surgeon: Daryll Brod, MD;  Location: [facility];  Service: Orthopedics;  Laterality: Left;  REGIONAL/FAB    Allergies  Allergen Reactions  . Sulfa Antibiotics Itching, Rash and Other (See Comments)    "Allergic," per MAR  . Amoxicillin Other (See Comments)    Per [facility] 09/15/17 Has patient had a PCN reaction causing immediate rash, facial/tongue/throat swelling, SOB or lightheadedness with hypotension: Unknown Has patient had a PCN reaction causing severe rash involving mucus membranes or skin necrosis: Unknown Has patient had a PCN reaction that required hospitalization: Unknown Has patient had a PCN reaction occurring within the last 10 years: Unknown If all of the above answers are "NO", then may proceed with Cephalosporin use.  . Avelox [Moxifloxacin Hcl In Nacl] Itching  . Doxycycline Other (See Comments)    "Allergic," per MAR  . Hista-Tabs [Triprolidine-Pse] Other (See Comments)    "Passed out"  . Pyrilamine-Phenylephrine Other (See Comments)    "Allergic," per [facility] 09/15/17  . Septra [Sulfamethoxazole-Trimethoprim] Itching, Rash and Other (See Comments)    "Allergic," per MAR    Allergies as of 12/03/2019      Reactions   Sulfa Antibiotics Itching, Rash, Other (See Comments)   "Allergic," per MAR   Amoxicillin Other (See Comments)   Per [facility] 09/15/17 Has patient had a PCN reaction causing immediate rash, facial/tongue/throat swelling, SOB or lightheadedness with hypotension: Unknown Has patient had a PCN reaction causing severe rash involving mucus membranes or skin necrosis: Unknown Has patient had a PCN reaction that required hospitalization: Unknown Has  patient had a PCN reaction occurring within the last 10 years: Unknown If all of the above answers are "NO", then may proceed with Cephalosporin use.   Avelox [moxifloxacin Hcl In Nacl] Itching   Doxycycline Other (See Comments)   "Allergic," per MAR   Hista-tabs [triprolidine-pse] Other (See Comments)   "Passed out"   Pyrilamine-phenylephrine Other (See Comments)   "Allergic," per [facility] 09/15/17   Septra [sulfamethoxazole-trimethoprim] Itching, Rash, Other (See Comments)   "Allergic," per [facility]      Medication List       Accurate as of December 03, 2019 11:59 PM. If you have any questions, ask your nurse or doctor.        acetaminophen 325 MG tablet Commonly known as: TYLENOL Take 650 mg by mouth every 6 (six) hours as needed (for pain).   aspirin EC 81 MG tablet Take 81 mg by mouth See admin instructions. Take 81 mg by mouth in the morning on Tuesday and Saturday. 7am-11am   benzonatate 100 MG capsule Commonly known as: TESSALON Take by mouth 3 (three) times daily as needed for cough.   chlorhexidine 0.12 % solution Commonly known as: PERIDEX Use as directed 15 mLs in the mouth or throat daily.   escitalopram 20 MG tablet Commonly known as: LEXAPRO Take 20 mg by mouth daily. In the morning 7am-11am.   famotidine 20 MG tablet Commonly known as: PEPCID Take 20 mg by mouth daily. In the morning 7am-11am.   Fluticasone-Salmeterol 250-50 MCG/DOSE Aepb Commonly known as: ADVAIR Inhale  1 puff into the lungs 2 (two) times daily. Morning: 7am-11am Evening: 4pm-7pm.  Rinse mouth after use   lactose free nutrition Liqd Take 237 mLs by mouth See admin instructions. Add 237 ml's to ice cream and consume two times a day. 10am & 4pm.   levothyroxine 25 MCG tablet Commonly known as: SYNTHROID Take 25 mcg by mouth daily before breakfast. 7:30am.   mirtazapine 15 MG tablet Commonly known as: REMERON Take 15 mg by mouth daily. 7:30pm-11pm.   OXYGEN Inhale 2 L/min into the lungs  as needed (to keep O2 SATS >90%).   polyethylene glycol 17 g packet Commonly known as: MIRALAX / GLYCOLAX Take 17 g by mouth daily. Mix with 6-8 ounces of fluid of choice. Once daily 7am-11:00am.   polyvinyl alcohol 1.4 % ophthalmic solution Commonly known as: LIQUIFILM TEARS Place 1 drop into both eyes 3 (three) times daily.   PreviDent 1.1 % Gel dental gel Generic drug: sodium fluoride Place 1 application onto teeth at bedtime. brush on teeth with toothbrush after PM MOUTHCARE . SPIT OUT EXCESS AND DO NOT RINSE   raloxifene 60 MG tablet Commonly known as: EVISTA Take 60 mg by mouth daily. 4pm-7pm.   Ventolin HFA 108 (90 Base) MCG/ACT inhaler Generic drug: albuterol Inhale 2 puffs into the lungs every 6 (six) hours as needed for shortness of breath (dyspnea).   zinc oxide 20 % ointment Apply to buttocks as needed every shift after incontinent episodes       Review of Systems  Constitutional: Positive for fever and unexpected weight change. Negative for fatigue.       Gradual weight loss  HENT: Positive for congestion and hearing loss. Negative for voice change.   Eyes: Negative for visual disturbance.  Respiratory: Positive for cough and shortness of breath. Negative for wheezing.        DOE  Cardiovascular: Negative for leg swelling.  Gastrointestinal: Negative for abdominal pain and constipation.  Genitourinary: Negative for difficulty urinating, dysuria and urgency.  Musculoskeletal: Positive for arthralgias and gait problem.  Skin: Negative for color change.  Neurological: Negative for syncope, weakness and light-headedness.       Memory lapses.  Psychiatric/Behavioral: Negative for agitation and sleep disturbance. The patient is not nervous/anxious.     Immunization History  Administered Date(s) Administered  . Influenza Whole 12/27/2011, 11/05/2012  . Influenza, High Dose Seasonal PF 11/19/2018  . Influenza-Unspecified 11/18/2013, 11/03/2014, 11/16/2015,  11/13/2016, 11/17/2017  . Moderna SARS-COVID-2 Vaccination 02/08/2019, 03/08/2019  . PPD Test 04/04/2008, 11/23/2014  . Pneumococcal Conjugate-13 09/30/2016  . Pneumococcal Polysaccharide-23 02/05/2008, 06/03/2008  . Td 02/05/2008, 06/03/2008   Pertinent  Health Maintenance Due  Topic Date Due  . INFLUENZA VACCINE  09/05/2019  . DEXA SCAN  Completed  . PNA vac Low Risk Adult  Completed   Fall Risk  10/09/2017 09/23/2016 08/27/2016 05/30/2015 05/16/2015  Falls in the past year? Yes Yes Yes Yes No  Comment - - Emmi Telephone Survey: data to providers prior to load - -  Number falls in past yr: 2 or more 1 1 1  -  Comment - - Emmi Telephone Survey Actual Response = 1 - -  Injury with Fall? Yes No No Yes -  Comment broke R wrist - - - -  Risk Factor Category  - - - - -  Risk for fall due to : - - - History of fall(s);Impaired balance/gait;Impaired mobility -  Follow up - - - Falls evaluation completed;Education provided -   Functional  Status Survey:    Vitals:   12/03/19 1044  BP: (!) 103/55  Pulse: (!) 59  Resp: 20  Temp: 99.6 F (37.6 C)  SpO2: 90%  Weight: 129 lb 6.4 oz (58.7 kg)  Height: 4\' 8"  (1.422 m)   Body mass index is 29.01 kg/m. Physical Exam Vitals and nursing note reviewed.  Constitutional:      Appearance: Normal appearance.  HENT:     Head: Normocephalic and atraumatic.  Eyes:     Extraocular Movements: Extraocular movements intact.     Conjunctiva/sclera: Conjunctivae normal.     Pupils: Pupils are equal, round, and reactive to light.  Cardiovascular:     Rate and Rhythm: Normal rate and regular rhythm.     Heart sounds: No murmur heard.   Pulmonary:     Breath sounds: No rales.  Abdominal:     General: Bowel sounds are normal.     Palpations: Abdomen is soft.     Tenderness: There is no abdominal tenderness.  Musculoskeletal:     Cervical back: Normal range of motion and neck supple.     Right lower leg: No edema.     Left lower leg: No edema.      Comments: R shoulder limited over head ROM  Skin:    General: Skin is warm and dry.  Neurological:     General: No focal deficit present.     Mental Status: She is alert. Mental status is at baseline.     Gait: Gait abnormal.     Comments: Oriented to person  Psychiatric:        Mood and Affect: Mood normal.        Behavior: Behavior normal.        Thought Content: Thought content normal.     Comments: Followed simple directions, smiled during today's examination     Labs reviewed: Recent Labs    01/07/19 0135 01/07/19 0605 01/08/19 0357 01/08/19 0357 01/09/19 0335 01/09/19 0335 03/24/19 0000 06/17/19 0000 09/28/19 0000  NA 141   < > 138   < > 143  --  141 143 143  K 4.0   < > 4.8   < > 4.1   < > 4.3 4.0 4.2  CL 105   < > 105   < > 109   < > 108 110* 110*  CO2 23   < > 24   < > 27   < > 25* 26* 22  GLUCOSE 180*  --  169*  --  108*  --   --   --   --   BUN 13   < > 26*   < > 25*  --  20 27* 12  CREATININE 0.99   < > 1.41*   < > 1.05*  --  1.0 1.0 0.9  CALCIUM 9.5   < > 9.0   < > 9.0   < > 9.1 9.3 8.8   < > = values in this interval not displayed.   Recent Labs    03/24/19 0000 06/17/19 0000 09/28/19 0000  AST 20 14 16   ALT 18 11 13   ALKPHOS 57 45 54  ALBUMIN 3.5 3.6 3.2*   Recent Labs    01/07/19 0605 01/07/19 0605 01/07/19 0737 01/07/19 0737 01/08/19 0357 01/08/19 0357 03/24/19 0000 06/17/19 0000 09/28/19 0000  WBC 7.9   < > 6.9   < > 11.2*  --  7.3 5.4 6.9  NEUTROABS  --   --   --   --   --   --  3,329 2,117 3,064  HGB 13.5   < > 13.2   < > 12.3   < > 13.8 12.9 12.9  HCT 42.3   < > 41.2   < > 38.7   < > 41 39 39  MCV 94.2  --  93.4  --  94.4  --   --   --   --   PLT 259   < > 258   < > 235   < > 232 224 195   < > = values in this interval not displayed.   Lab Results  Component Value Date   TSH 1.03 09/28/2019   Lab Results  Component Value Date   HGBA1C 6.0 06/17/2019   Lab Results  Component Value Date   CHOL 211 (A) 02/14/2014   HDL 47  02/14/2014   LDLCALC 133 02/14/2014   TRIG 155 02/14/2014    Significant Diagnostic Results in last 30 days:  No results found.  Assessment/Plan COPD (chronic obstructive pulmonary disease) (HCC) COPD, stable,takesprn Albuterol HFA, prn Tessalon, Advair bid.c/o fever, cough, congestion. MOST comfort measures, no IVF/ABT. Prn Tylenol and Albuterol HFA, Tessalon available to her, also takes Advair bid. Will schedule Claritin 10mg  qd, Benzonatate 100mg  tid x 5 days, may consider Prednisone taper dose if no better.    Adult failure to thrive Persisted gradual weight loss, dietary increased boost plus tid with meals, already taking Mirtazapine.    Cognitive impairment Dementia, her goal of care is comfort measures, MOST no IVF/ABT.   Constipation Hx of constipation, stable, on MiraLax qd.  Depression, recurrent ([place]) Her mood is stable, on Mirtazapine 15mg  qd, Escitalopram 20mg  qd.  Hypothyroid Hypothyroidism, stable, on Levothyroxine 39mcg qd. TSH 1.03 09/28/19   GERD (gastroesophageal reflux disease) Stable, continue Famotidine 20mg  qd.     Family/ staff Communication: plan of care reviewed with the patient and charge nurse.   Labs/tests ordered:  none  Time spend 35 minutes.

## 2019-12-03 NOTE — Assessment & Plan Note (Signed)
COPD, stable,takesprn Albuterol HFA, prn Tessalon, Advair bid.c/o fever, cough, congestion. MOST comfort measures, no IVF/ABT. Prn Tylenol and Albuterol HFA, Tessalon available to her, also takes Advair bid. Will schedule Claritin 10mg  qd, Benzonatate 100mg  tid x 5 days, may consider Prednisone taper dose if no better.

## 2019-12-06 ENCOUNTER — Encounter: Payer: Self-pay | Admitting: Nurse Practitioner

## 2019-12-13 ENCOUNTER — Non-Acute Institutional Stay (SKILLED_NURSING_FACILITY): Payer: Medicare Other | Admitting: Nurse Practitioner

## 2019-12-13 DIAGNOSIS — R627 Adult failure to thrive: Secondary | ICD-10-CM

## 2019-12-13 DIAGNOSIS — J42 Unspecified chronic bronchitis: Secondary | ICD-10-CM | POA: Diagnosis not present

## 2019-12-13 DIAGNOSIS — K219 Gastro-esophageal reflux disease without esophagitis: Secondary | ICD-10-CM

## 2019-12-13 DIAGNOSIS — F339 Major depressive disorder, recurrent, unspecified: Secondary | ICD-10-CM | POA: Diagnosis not present

## 2019-12-13 DIAGNOSIS — R4189 Other symptoms and signs involving cognitive functions and awareness: Secondary | ICD-10-CM

## 2019-12-13 DIAGNOSIS — E039 Hypothyroidism, unspecified: Secondary | ICD-10-CM | POA: Diagnosis not present

## 2019-12-13 DIAGNOSIS — K5900 Constipation, unspecified: Secondary | ICD-10-CM

## 2019-12-13 NOTE — Assessment & Plan Note (Signed)
GERD, stable, on Famotidine 20mg  qd.

## 2019-12-13 NOTE — Assessment & Plan Note (Signed)
Hx of constipation, stable, on MiraLax qd.

## 2019-12-13 NOTE — Assessment & Plan Note (Signed)
Her mood is stable, on Mirtazapine 15mg qd, Escitalopram 20mg qd.  

## 2019-12-13 NOTE — Assessment & Plan Note (Signed)
Hypothyroidism, stable, on Levothyroxine 58mcg qd.TSH 1.03 09/28/19

## 2019-12-13 NOTE — Assessment & Plan Note (Signed)
Dementia,her goal of care is comfort measures, MOST no IVF/ABT.

## 2019-12-13 NOTE — Assessment & Plan Note (Addendum)
Persisted gradual weight loss, weight loss about #6 Ibs in the past 10 days. dietary increased boost plus tid with meals, already taking Mirtazapine. Continue supportive care in SNF [facility]

## 2019-12-13 NOTE — Progress Notes (Signed)
Location:    [facility] Room Number: 25 Place of Service:  SNF (31) Provider: Marlana Latus NP  Darletta Noblett X, NP  Patient Care Team: Ashlin Hidalgo X, NP as PCP - General (Internal Medicine) Newton Pigg, MD as Consulting Physician (Obstetrics and Gynecology) Monna Fam, MD as Consulting Physician (Ophthalmology) Melida Quitter, MD as Consulting Physician (Otolaryngology) Myrlene Broker, MD as Consulting Physician (Urology) [place], [facility] Virgie Dad, MD as Consulting Physician (Internal Medicine)  Extended Emergency Contact Information Primary Emergency Contact: Rehberg,[name] Address: [address] [facility] [place], [place] Phone: [phone] Work Phone: [phone] Mobile Phone: [phone] Relation: Son Secondary Emergency Contact: Teofilo [place] Phone: [phone] Work Phone: [phone] Relation: Friend  Code Status:  DNR Goals of care: Advanced Directive information Advanced Directives 12/13/2019  Does Patient Have a Medical Advance Directive? Yes  Type of Paramedic of [place];Living will;Out of facility DNR (pink MOST or yellow form)  Does patient want to make changes to medical advance directive? No - Patient declined  Copy of [place] in Chart? Yes - validated most recent copy scanned in chart (See row information)  Pre-existing out of facility DNR order (yellow form or pink MOST form) Pink MOST form placed in chart (order not valid for inpatient use);Yellow form placed in chart (order not valid for inpatient use)     Chief Complaint  Patient presents with   Medical Management of Chronic Issues   Health Maintenance    TDAP    HPI:  Pt is a 86 y.o. female seen today for medical management of chronic diseases.   Persisted gradual weight loss, dietary increased boost plus tid with meals, already taking  Mirtazapine.              Dementia,her goal of care is comfort measures, MOST no IVF/ABT.  Hx of constipation, stable, on MiraLax qd.  Her mood is stable, on Mirtazapine 15mg  qd, Escitalopram 20mg  qd.  Hypothyroidism, stable, on Levothyroxine 6mcg qd.TSH 1.03 09/28/19 COPD, stable,takesprn Albuterol HFA, prn Tessalon, Advair bid. GERD, stable, on Famotidine 20mg  qd.   Past Medical History:  Diagnosis Date   Asthma    Carpal tunnel syndrome 03/07/2009   Disturbance of skin sensation 02/21/2009   Diverticulosis of colon (without mention of hemorrhage) 02/16/2009   Fracture of upper end of left humerus 11/18/14   Hypertonicity of bladder 02/16/2009   Hypothyroid    Osteoarthrosis, unspecified whether generalized or localized, unspecified site 02/16/2009   Other abnormal blood chemistry 06/19/2010   hyperglycemia   Other and unspecified hyperlipidemia 10/24/2009   Other atopic dermatitis and related conditions 02/16/2009   Otosclerosis, unspecified 02/22/2003   Pain in limb 12/05/2009   Senile osteoporosis 06/16/2010   Sensorineural hearing loss, unspecified 02/21/2009   Syncope and collapse 05/30/2009   Unspecified nasal polyp 02/21/2009   Unspecified urinary incontinence 02/21/2009   Past Surgical History:  Procedure Laterality Date   ABDOMINAL HYSTERECTOMY  1999   Dr. Collier Bullock   BREAST BIOPSY  07/16/1990   benign left breast needle biopsy  Dr. Margot Chimes   CARPAL TUNNEL RELEASE  04/07/2009   right Dr. Daylene Katayama   CATARACT EXTRACTION W/ INTRAOCULAR LENS  IMPLANT, BILATERAL Bilateral    INTRAMEDULLARY (IM) NAIL INTERTROCHANTERIC Right 09/17/2017   Procedure: INTRAMEDULLARY (IM) NAIL INTERTROCHANTRIC HIP;  Surgeon: Nicholes Stairs, MD;  Location: [facility]  OR;  Service: Orthopedics;  Laterality: Right;   OPEN REDUCTION INTERNAL FIXATION (ORIF) DISTAL RADIAL FRACTURE Right 09/17/2017   Procedure: OPEN  REDUCTION INTERNAL FIXATION (ORIF) DISTAL RADIAL FRACTURE;  Surgeon: Iran Planas, MD;  Location: [place];  Service: Orthopedics;  Laterality: Right;   TRIGGER FINGER RELEASE Left 08/09/2014   Procedure: RELEASE TRIGGER FINGER/A-1 PULLEY LEFT MIDDLE FINGER;  Surgeon: Daryll Brod, MD;  Location: [place];  Service: Orthopedics;  Laterality: Left;  REGIONAL/FAB    Allergies  Allergen Reactions   Sulfa Antibiotics Itching, Rash and Other (See Comments)    "Allergic," per MAR   Amoxicillin Other (See Comments)    Per [facility] 09/15/17 Has patient had a PCN reaction causing immediate rash, facial/tongue/throat swelling, SOB or lightheadedness with hypotension: Unknown Has patient had a PCN reaction causing severe rash involving mucus membranes or skin necrosis: Unknown Has patient had a PCN reaction that required hospitalization: Unknown Has patient had a PCN reaction occurring within the last 10 years: Unknown If all of the above answers are "NO", then may proceed with Cephalosporin use.   Avelox [Moxifloxacin Hcl In Nacl] Itching   Doxycycline Other (See Comments)    "Allergic," per MAR   Hista-Tabs [Triprolidine-Pse] Other (See Comments)    "Passed out"   Pyrilamine-Phenylephrine Other (See Comments)    "Allergic," per [facility] 09/15/17   Septra [Sulfamethoxazole-Trimethoprim] Itching, Rash and Other (See Comments)    "Allergic," per MAR    Allergies as of 12/13/2019      Reactions   Sulfa Antibiotics Itching, Rash, Other (See Comments)   "Allergic," per MAR   Amoxicillin Other (See Comments)   Per [facility] 09/15/17 Has patient had a PCN reaction causing immediate rash, facial/tongue/throat swelling, SOB or lightheadedness with hypotension: Unknown Has patient had a PCN reaction causing severe rash involving mucus membranes or skin necrosis: Unknown Has patient had a PCN reaction that required hospitalization: Unknown Has patient had a PCN reaction occurring within the last 10  years: Unknown If all of the above answers are "NO", then may proceed with Cephalosporin use.   Avelox [moxifloxacin Hcl In Nacl] Itching   Doxycycline Other (See Comments)   "Allergic," per MAR   Hista-tabs [triprolidine-pse] Other (See Comments)   "Passed out"   Pyrilamine-phenylephrine Other (See Comments)   "Allergic," per [facility] 09/15/17   Septra [sulfamethoxazole-trimethoprim] Itching, Rash, Other (See Comments)   "Allergic," per [facility]      Medication List       Accurate as of December 13, 2019 11:59 PM. If you have any questions, ask your nurse or doctor.        acetaminophen 325 MG tablet Commonly known as: TYLENOL Take 650 mg by mouth every 6 (six) hours as needed (for pain).   aspirin EC 81 MG tablet Take 81 mg by mouth See admin instructions. Take 81 mg by mouth in the morning on Tuesday and Saturday. 7am-11am   benzonatate 100 MG capsule Commonly known as: TESSALON Take by mouth 3 (three) times daily as needed for cough.   chlorhexidine 0.12 % solution Commonly known as: PERIDEX Use as directed 15 mLs in the mouth or throat daily.   escitalopram 20 MG tablet Commonly known as: LEXAPRO Take 20 mg by mouth daily. In the morning 7am-11am.   famotidine 20 MG tablet Commonly known as: PEPCID Take 20 mg by mouth daily. In the morning 7am-11am.   Fluticasone-Salmeterol 250-50 MCG/DOSE Aepb Commonly known as: ADVAIR Inhale 1 puff into the lungs 2 (two)  times daily. Morning: 7am-11am Evening: 4pm-7pm.  Rinse mouth after use   lactose free nutrition Liqd Take 237 mLs by mouth See admin instructions. Add 237 ml's to ice cream and consume two times a day. 10am & 4pm.   levothyroxine 25 MCG tablet Commonly known as: SYNTHROID Take 25 mcg by mouth daily before breakfast. 7:30am.   mirtazapine 15 MG tablet Commonly known as: REMERON Take 15 mg by mouth daily. 7:30pm-11pm.   OXYGEN Inhale 2 L/min into the lungs as needed (to keep O2 SATS >90%).   polyethylene  glycol 17 g packet Commonly known as: MIRALAX / GLYCOLAX Take 17 g by mouth daily. Mix with 6-8 ounces of fluid of choice. Once daily 7am-11:00am.   polyvinyl alcohol 1.4 % ophthalmic solution Commonly known as: LIQUIFILM TEARS Place 1 drop into both eyes 3 (three) times daily.   PreviDent 1.1 % Gel dental gel Generic drug: sodium fluoride Place 1 application onto teeth at bedtime. brush on teeth with toothbrush after PM MOUTHCARE . SPIT OUT EXCESS AND DO NOT RINSE   raloxifene 60 MG tablet Commonly known as: EVISTA Take 60 mg by mouth daily. 4pm-7pm.   Ventolin HFA 108 (90 Base) MCG/ACT inhaler Generic drug: albuterol Inhale 2 puffs into the lungs every 6 (six) hours as needed for shortness of breath (dyspnea).   zinc oxide 20 % ointment Apply to buttocks as needed every shift after incontinent episodes       Review of Systems  Constitutional: Positive for unexpected weight change. Negative for activity change, appetite change and fever.       Weight loss about #6 Ibs in the past 10 days.   HENT: Positive for congestion and hearing loss. Negative for voice change.   Eyes: Negative for visual disturbance.  Respiratory: Positive for cough. Negative for shortness of breath and wheezing.        DOE  Cardiovascular: Negative for leg swelling.  Gastrointestinal: Negative for abdominal pain and constipation.  Genitourinary: Negative for difficulty urinating, dysuria and urgency.  Musculoskeletal: Positive for arthralgias and gait problem.  Skin: Negative for color change.  Neurological: Negative for syncope, weakness and light-headedness.       Memory lapses.  Psychiatric/Behavioral: Negative for agitation and sleep disturbance. The patient is not nervous/anxious.     Immunization History  Administered Date(s) Administered   Influenza Whole 12/27/2011, 11/05/2012   Influenza, High Dose Seasonal PF 11/19/2018   Influenza-Unspecified 11/18/2013, 11/03/2014, 11/16/2015,  11/13/2016, 11/17/2017, 11/09/2019   Moderna SARS-COVID-2 Vaccination 02/08/2019, 03/08/2019   PPD Test 04/04/2008, 11/23/2014   Pneumococcal Conjugate-13 09/30/2016   Pneumococcal Polysaccharide-23 02/05/2008, 06/03/2008   Td 02/05/2008, 06/03/2008   Pertinent  Health Maintenance Due  Topic Date Due   INFLUENZA VACCINE  Completed   DEXA SCAN  Completed   PNA vac Low Risk Adult  Completed   Fall Risk  10/09/2017 09/23/2016 08/27/2016 05/30/2015 05/16/2015  Falls in the past year? Yes Yes Yes Yes No  Comment - - Emmi Telephone Survey: data to providers prior to load - -  Number falls in past yr: 2 or more 1 1 1  -  Comment - - Emmi Telephone Survey Actual Response = 1 - -  Injury with Fall? Yes No No Yes -  Comment broke R wrist - - - -  Risk Factor Category  - - - - -  Risk for fall due to : - - - History of fall(s);Impaired balance/gait;Impaired mobility -  Follow up - - - Falls evaluation completed;Education  provided -   Functional Status Survey:    Vitals:   12/13/19 1035  BP: (!) 119/56  Pulse: 67  Resp: 17  Temp: (!) 96.8 F (36 C)  SpO2: 92%  Weight: 123 lb 12.8 oz (56.2 kg)  Height: 4\' 8"  (1.422 m)   Body mass index is 27.76 kg/m. Physical Exam Vitals and nursing note reviewed.  Constitutional:      Appearance: Normal appearance.  HENT:     Head: Normocephalic and atraumatic.     Mouth/Throat:     Mouth: Mucous membranes are moist.  Eyes:     Extraocular Movements: Extraocular movements intact.     Conjunctiva/sclera: Conjunctivae normal.     Pupils: Pupils are equal, round, and reactive to light.  Cardiovascular:     Rate and Rhythm: Normal rate and regular rhythm.     Heart sounds: No murmur heard.   Pulmonary:     Breath sounds: No rales.  Abdominal:     General: Bowel sounds are normal.     Palpations: Abdomen is soft.     Tenderness: There is no abdominal tenderness.  Musculoskeletal:     Cervical back: Normal range of motion and neck  supple.     Right lower leg: No edema.     Left lower leg: No edema.     Comments: R shoulder limited over head ROM  Skin:    General: Skin is warm and dry.  Neurological:     General: No focal deficit present.     Mental Status: She is alert. Mental status is at baseline.     Gait: Gait abnormal.     Comments: Oriented to person  Psychiatric:        Mood and Affect: Mood normal.        Behavior: Behavior normal.        Thought Content: Thought content normal.     Comments: Followed simple directions, smiled during today's examination     Labs reviewed: Recent Labs    01/07/19 0135 01/07/19 0605 01/08/19 0357 01/08/19 0357 01/09/19 0335 01/09/19 0335 03/24/19 0000 06/17/19 0000 09/28/19 0000  NA 141   < > 138   < > 143  --  141 143 143  K 4.0   < > 4.8   < > 4.1   < > 4.3 4.0 4.2  CL 105   < > 105   < > 109   < > 108 110* 110*  CO2 23   < > 24   < > 27   < > 25* 26* 22  GLUCOSE 180*  --  169*  --  108*  --   --   --   --   BUN 13   < > 26*   < > 25*  --  20 27* 12  CREATININE 0.99   < > 1.41*   < > 1.05*  --  1.0 1.0 0.9  CALCIUM 9.5   < > 9.0   < > 9.0   < > 9.1 9.3 8.8   < > = values in this interval not displayed.   Recent Labs    03/24/19 0000 06/17/19 0000 09/28/19 0000  AST 20 14 16   ALT 18 11 13   ALKPHOS 57 45 54  ALBUMIN 3.5 3.6 3.2*   Recent Labs    01/07/19 0605 01/07/19 0605 01/07/19 0737 01/07/19 0737 01/08/19 0357 01/08/19 0357 03/24/19 0000 06/17/19 0000 09/28/19 0000  WBC 7.9   < >  6.9   < > 11.2*  --  7.3 5.4 6.9  NEUTROABS  --   --   --   --   --   --  3,329 2,117 3,064  HGB 13.5   < > 13.2   < > 12.3   < > 13.8 12.9 12.9  HCT 42.3   < > 41.2   < > 38.7   < > 41 39 39  MCV 94.2  --  93.4  --  94.4  --   --   --   --   PLT 259   < > 258   < > 235   < > 232 224 195   < > = values in this interval not displayed.   Lab Results  Component Value Date   TSH 1.03 09/28/2019   Lab Results  Component Value Date   HGBA1C 6.0 06/17/2019    Lab Results  Component Value Date   CHOL 211 (A) 02/14/2014   HDL 47 02/14/2014   LDLCALC 133 02/14/2014   TRIG 155 02/14/2014    Significant Diagnostic Results in last 30 days:  No results found.  Assessment/Plan GERD (gastroesophageal reflux disease) GERD, stable, on Famotidine 20mg  qd.   COPD (chronic obstructive pulmonary disease) (HCC) COPD, chronic cough, MOST comfort measures, no ABT/IVF, takesprn Albuterol HFA, prn Tessalon, Advair bid.  Hypothyroid Hypothyroidism, stable, on Levothyroxine 3mcg qd.TSH 1.03 09/28/19   Depression, recurrent ([place]) Her mood is stable, on Mirtazapine 15mg  qd, Escitalopram 20mg  qd.   Constipation Hx of constipation, stable, on MiraLax qd.   Cognitive impairment Dementia,her goal of care is comfort measures, MOST no IVF/ABT.   Adult failure to thrive Persisted gradual weight loss, weight loss about #6 Ibs in the past 10 days. dietary increased boost plus tid with meals, already taking Mirtazapine. Continue supportive care in SNF [facility]    Family/ staff Communication: plan of care reviewed with the patient and charge nurse.   Labs/tests ordered:  none  Time spend 25 minutes.

## 2019-12-13 NOTE — Assessment & Plan Note (Addendum)
COPD, chronic cough, MOST comfort measures, no ABT/IVF, takesprn Albuterol HFA, prn Tessalon, Advair bid.

## 2019-12-14 ENCOUNTER — Encounter: Payer: Self-pay | Admitting: Nurse Practitioner

## 2019-12-23 ENCOUNTER — Encounter: Payer: Self-pay | Admitting: Internal Medicine

## 2019-12-23 ENCOUNTER — Non-Acute Institutional Stay (SKILLED_NURSING_FACILITY): Payer: Medicare Other | Admitting: Internal Medicine

## 2019-12-23 DIAGNOSIS — K219 Gastro-esophageal reflux disease without esophagitis: Secondary | ICD-10-CM | POA: Diagnosis not present

## 2019-12-23 DIAGNOSIS — J42 Unspecified chronic bronchitis: Secondary | ICD-10-CM

## 2019-12-23 DIAGNOSIS — E039 Hypothyroidism, unspecified: Secondary | ICD-10-CM | POA: Diagnosis not present

## 2019-12-23 DIAGNOSIS — R4189 Other symptoms and signs involving cognitive functions and awareness: Secondary | ICD-10-CM

## 2019-12-23 DIAGNOSIS — M810 Age-related osteoporosis without current pathological fracture: Secondary | ICD-10-CM

## 2019-12-23 DIAGNOSIS — R001 Bradycardia, unspecified: Secondary | ICD-10-CM

## 2019-12-23 DIAGNOSIS — F339 Major depressive disorder, recurrent, unspecified: Secondary | ICD-10-CM | POA: Diagnosis not present

## 2019-12-23 DIAGNOSIS — R627 Adult failure to thrive: Secondary | ICD-10-CM

## 2019-12-23 NOTE — Progress Notes (Signed)
Location:  [facility] Room Number: [place] of Service:  SNF (31) Provider:  Veleta Miners, MD  Patient Care Team: Mast, Man X, NP as PCP - General (Internal Medicine) Newton Pigg, MD as Consulting Physician (Obstetrics and Gynecology) Monna Fam, MD as Consulting Physician (Ophthalmology) Melida Quitter, MD as Consulting Physician (Otolaryngology) Myrlene Broker, MD as Consulting Physician (Urology) [place] Virgie Dad, MD as Consulting Physician (Internal Medicine)  Extended Emergency Contact Information Primary Emergency Contact: Noteboom,[name] Address: [place], [place] Phone: [phone] Work Phone: [phone] Mobile Phone: [phone] Relation: Son Secondary Emergency Contact: Teofilo [place] of [place] Phone: [phone] Work Phone: [phone] Relation: Friend  Code Status:  DNR Goals of care: Advanced Directive information Advanced Directives 12/13/2019  Does Patient Have a Medical Advance Directive? Yes  Type of Paramedic of [place];Living will;Out of facility DNR (pink MOST or yellow form)  Does patient want to make changes to medical advance directive? No - Patient declined  Copy of [place] in Chart? Yes - validated most recent copy scanned in chart (See row information)  Pre-existing out of facility DNR order (yellow form or pink MOST form) Pink MOST form placed in chart (order not valid for inpatient use);Yellow form placed in chart (order not valid for inpatient use)     Chief Complaint  Patient presents with  . Acute Visit    Patient is seen for cough with green mucus production    HPI:  Pt is a 86 y.o. female seen today for an acute visit for Cough Weak and Weight loss  Patient is a long-term care resident Patient has h/o Sinus Bradycardia, LE edema, Hypothyroidism, Depression,  Chronic Sinusitis, H/o Hip Fracture,And Proximal Humerus Fracture due to Fall with Delayed healing  Recently patient has been getting more weak.  Poor appetite has lost 10 pounds.  More confused . Per nurses they had noticed discharge from her nurse which is greenish. Had some cough no fever shortness of breath or chest pain. Family does not want anything aggressive to be comfortable.  They do not want antibiotics either.   Past Medical History:  Diagnosis Date  . Asthma   . Carpal tunnel syndrome 03/07/2009  . Disturbance of skin sensation 02/21/2009  . Diverticulosis of colon (without mention of hemorrhage) 02/16/2009  . Fracture of upper end of left humerus 11/18/14  . 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 Hypothyroid   . Osteoarthrosis, unspecified whether generalized or localized, unspecified site 02/16/2009  . Other abnormal blood chemistry 06/19/2010   hyperglycemia  . Other and unspecified hyperlipidemia 10/24/2009  . Other atopic dermatitis and related conditions 02/16/2009  . Otosclerosis, unspecified 02/22/2003  . Pain in limb 12/05/2009  . Senile osteoporosis 06/16/2010  . Sensorineural hearing loss, unspecified 02/21/2009  . Syncope and collapse 05/30/2009  . Unspecified nasal polyp 02/21/2009  . Unspecified urinary incontinence 02/21/2009   Past Surgical History:  Procedure Laterality Date  . ABDOMINAL HYSTERECTOMY  1999   Dr. Collier Bullock  . BREAST BIOPSY  07/16/1990   benign left breast needle biopsy  Dr. Margot Chimes  . CARPAL TUNNEL RELEASE  04/07/2009   right Dr. Daylene Katayama  . CATARACT EXTRACTION W/ INTRAOCULAR LENS  IMPLANT, BILATERAL Bilateral   . INTRAMEDULLARY (IM) NAIL INTERTROCHANTERIC Right 09/17/2017   Procedure: INTRAMEDULLARY (IM) NAIL INTERTROCHANTRIC HIP;  Surgeon: Victorino December  Saralyn Pilar, MD;  Location: [facility];  Service: Orthopedics;  Laterality: Right;  . OPEN REDUCTION INTERNAL FIXATION (ORIF) DISTAL RADIAL FRACTURE Right 09/17/2017   Procedure: OPEN REDUCTION INTERNAL FIXATION  (ORIF) DISTAL RADIAL FRACTURE;  Surgeon: Iran Planas, MD;  Location: [facility];  Service: Orthopedics;  Laterality: Right;  . TRIGGER FINGER RELEASE Left 08/09/2014   Procedure: RELEASE TRIGGER FINGER/A-1 PULLEY LEFT MIDDLE FINGER;  Surgeon: Daryll Brod, MD;  Location: [facility];  Service: Orthopedics;  Laterality: Left;  REGIONAL/FAB    Allergies  Allergen Reactions  . Sulfa Antibiotics Itching, Rash and Other (See Comments)    "Allergic," per MAR  . Amoxicillin Other (See Comments)    Per [facility] 09/15/17 Has patient had a PCN reaction causing immediate rash, facial/tongue/throat swelling, SOB or lightheadedness with hypotension: Unknown Has patient had a PCN reaction causing severe rash involving mucus membranes or skin necrosis: Unknown Has patient had a PCN reaction that required hospitalization: Unknown Has patient had a PCN reaction occurring within the last 10 years: Unknown If all of the above answers are "NO", then may proceed with Cephalosporin use.  . Avelox [Moxifloxacin Hcl In Nacl] Itching  . Doxycycline Other (See Comments)    "Allergic," per MAR  . Hista-Tabs [Triprolidine-Pse] Other (See Comments)    "Passed out"  . Pyrilamine-Phenylephrine Other (See Comments)    "Allergic," per [facility] 09/15/17  . Septra [Sulfamethoxazole-Trimethoprim] Itching, Rash and Other (See Comments)    "Allergic," per [facility]    Outpatient Encounter Medications as of 12/23/2019  Medication Sig  . acetaminophen (TYLENOL) 325 MG tablet Take 650 mg by mouth every 6 (six) hours as needed (for pain).   Marland Kitchen albuterol (VENTOLIN HFA) 108 (90 Base) MCG/ACT inhaler Inhale 2 puffs into the lungs every 6 (six) hours as needed for shortness of breath (dyspnea).   Marland Kitchen aspirin EC 81 MG tablet Take 81 mg by mouth See admin instructions. Take 81 mg by mouth in the morning on Tuesday and Saturday. 7am-11am  . benzonatate (TESSALON) 100 MG capsule Take by mouth 3 (three) times daily as needed for cough.  .  chlorhexidine (PERIDEX) 0.12 % solution Use as directed 15 mLs in the mouth or throat daily.  Marland Kitchen escitalopram (LEXAPRO) 20 MG tablet Take 20 mg by mouth daily. In the morning 7am-11am.  . famotidine (PEPCID) 20 MG tablet Take 20 mg by mouth daily. In the morning 7am-11am.  . Fluticasone-Salmeterol (ADVAIR) 250-50 MCG/DOSE AEPB Inhale 1 puff into the lungs 2 (two) times daily. Morning: 7am-11am Evening: 4pm-7pm.  Rinse mouth after use  . lactose free nutrition (BOOST PLUS) LIQD Take 237 mLs by mouth in the morning, at noon, and at bedtime. Add 237 ml's to ice cream and consume two times a day. 10am & 4pm.  . levothyroxine (SYNTHROID, LEVOTHROID) 25 MCG tablet Take 25 mcg by mouth daily before breakfast. 7:30am.  . mirtazapine (REMERON) 15 MG tablet Take 15 mg by mouth daily. 7:30pm-11pm.  . OXYGEN Inhale 2 L/min into the lungs as needed (to keep O2 SATS >90%).   . polyethylene glycol (MIRALAX / GLYCOLAX) packet Take 17 g by mouth daily. Mix with 6-8 ounces of fluid of choice. Once daily 7am-11:00am.  . polyvinyl alcohol (LIQUIFILM TEARS) 1.4 % ophthalmic solution Place 1 drop into both eyes 3 (three) times daily.  . raloxifene (EVISTA) 60 MG tablet Take 60 mg by mouth daily. 4pm-7pm.   . sodium fluoride (PREVIDENT) 1.1 % GEL dental gel Place 1 application onto teeth at bedtime.  brush on teeth with toothbrush after PM MOUTHCARE . SPIT OUT EXCESS AND DO NOT RINSE  . zinc oxide 20 % ointment Apply to buttocks as needed every shift after incontinent episodes   No facility-administered encounter medications on file as of 12/23/2019.    Review of Systems  Constitutional: Positive for activity change, appetite change and unexpected weight change.  HENT: Positive for congestion, postnasal drip and rhinorrhea.   Respiratory: Positive for cough.   Cardiovascular: Negative.   Gastrointestinal: Negative.   Genitourinary: Negative.   Musculoskeletal: Positive for gait problem.  Skin: Negative.    Neurological: Positive for weakness.  Psychiatric/Behavioral: Positive for confusion.    Immunization History  Administered Date(s) Administered  . Influenza Whole 12/27/2011, 11/05/2012  . Influenza, High Dose Seasonal PF 11/19/2018  . Influenza-Unspecified 11/18/2013, 11/03/2014, 11/16/2015, 11/13/2016, 11/17/2017, 11/09/2019  . Moderna SARS-COVID-2 Vaccination 02/08/2019, 03/08/2019  . PPD Test 04/04/2008, 11/23/2014  . Pneumococcal Conjugate-13 09/30/2016  . Pneumococcal Polysaccharide-23 02/05/2008, 06/03/2008  . Td 02/05/2008, 06/03/2008   Pertinent  Health Maintenance Due  Topic Date Due  . INFLUENZA VACCINE  Completed  . DEXA SCAN  Completed  . PNA vac Low Risk Adult  Completed   Fall Risk  10/09/2017 09/23/2016 08/27/2016 05/30/2015 05/16/2015  Falls in the past year? Yes Yes Yes Yes No  Comment - - Emmi Telephone Survey: data to providers prior to load - -  Number falls in past yr: 2 or more 1 1 1  -  Comment - - Emmi Telephone Survey Actual Response = 1 - -  Injury with Fall? Yes No No Yes -  Comment broke R wrist - - - -  Risk Factor Category  - - - - -  Risk for fall due to : - - - History of fall(s);Impaired balance/gait;Impaired mobility -  Follow up - - - Falls evaluation completed;Education provided -   Functional Status Survey:    Vitals:   12/23/19 1208  BP: 109/62  Pulse: 72  Resp: 13  Temp: 97.8 F (36.6 C)  TempSrc: Oral  SpO2: 93%  Weight: 123 lb 12.8 oz (56.2 kg)  Height: 4\' 8"  (1.422 m)   Body mass index is 27.76 kg/m. Physical Exam Vitals reviewed.  Constitutional:      Appearance: Normal appearance.  HENT:     Head: Normocephalic.     Nose: Nose normal.     Mouth/Throat:     Mouth: Mucous membranes are moist.     Pharynx: Oropharynx is clear.  Eyes:     Pupils: Pupils are equal, round, and reactive to light.  Cardiovascular:     Rate and Rhythm: Regular rhythm. Bradycardia present.  Pulmonary:     Effort: Pulmonary effort is  normal.     Breath sounds: Normal breath sounds. No wheezing or rales.  Abdominal:     General: Abdomen is flat. Bowel sounds are normal.     Palpations: Abdomen is soft.  Musculoskeletal:        General: No swelling.     Cervical back: Neck supple.  Skin:    General: Skin is warm.  Neurological:     General: No focal deficit present.     Mental Status: She is alert.  Psychiatric:        Mood and Affect: Mood normal.        Thought Content: Thought content normal.     Labs reviewed: Recent Labs    01/07/19 0135 01/07/19 0605 01/08/19 0357 01/08/19 0357 01/09/19 0335 01/09/19  0335 03/24/19 0000 06/17/19 0000 09/28/19 0000  NA 141   < > 138   < > 143  --  141 143 143  K 4.0   < > 4.8   < > 4.1   < > 4.3 4.0 4.2  CL 105   < > 105   < > 109   < > 108 110* 110*  CO2 23   < > 24   < > 27   < > 25* 26* 22  GLUCOSE 180*  --  169*  --  108*  --   --   --   --   BUN 13   < > 26*   < > 25*  --  20 27* 12  CREATININE 0.99   < > 1.41*   < > 1.05*  --  1.0 1.0 0.9  CALCIUM 9.5   < > 9.0   < > 9.0   < > 9.1 9.3 8.8   < > = values in this interval not displayed.   Recent Labs    03/24/19 0000 06/17/19 0000 09/28/19 0000  AST 20 14 16   ALT 18 11 13   ALKPHOS 57 45 54  ALBUMIN 3.5 3.6 3.2*   Recent Labs    01/07/19 0605 01/07/19 0605 01/07/19 0737 01/07/19 0737 01/08/19 0357 01/08/19 0357 03/24/19 0000 06/17/19 0000 09/28/19 0000  WBC 7.9   < > 6.9   < > 11.2*  --  7.3 5.4 6.9  NEUTROABS  --   --   --   --   --   --  3,329 2,117 3,064  HGB 13.5   < > 13.2   < > 12.3   < > 13.8 12.9 12.9  HCT 42.3   < > 41.2   < > 38.7   < > 41 39 39  MCV 94.2  --  93.4  --  94.4  --   --   --   --   PLT 259   < > 258   < > 235   < > 232 224 195   < > = values in this interval not displayed.   Lab Results  Component Value Date   TSH 1.03 09/28/2019   Lab Results  Component Value Date   HGBA1C 6.0 06/17/2019   Lab Results  Component Value Date   CHOL 211 (A) 02/14/2014    HDL 47 02/14/2014   LDLCALC 133 02/14/2014   TRIG 155 02/14/2014    Significant Diagnostic Results in last 30 days:  No results found.  Assessment/Plan  Chronic bronchitis, unspecified chronic bronchitis type ([place]) No Antibiotics per family Nasal Saline Spray On Advair Discontinue Evista and Aspirin Hospice referal Gastroesophageal reflux disease without esophagitis Continue Pepcid Depression, recurrent (HCC) On Remeron and Lexapro Acquired hypothyroidism TSH normal Adult failure to thrive On Remeron Sinus bradycardia  Cognitive impairment Supportive care Age-related osteoporosis without current pathological fracture Discontinue Evista   Family/ staff Communication:

## 2020-01-10 ENCOUNTER — Non-Acute Institutional Stay (SKILLED_NURSING_FACILITY): Payer: Medicare Other | Admitting: Nurse Practitioner

## 2020-01-10 ENCOUNTER — Encounter: Payer: Self-pay | Admitting: Nurse Practitioner

## 2020-01-10 DIAGNOSIS — R627 Adult failure to thrive: Secondary | ICD-10-CM

## 2020-01-10 DIAGNOSIS — F339 Major depressive disorder, recurrent, unspecified: Secondary | ICD-10-CM

## 2020-01-10 DIAGNOSIS — R4189 Other symptoms and signs involving cognitive functions and awareness: Secondary | ICD-10-CM | POA: Diagnosis not present

## 2020-01-10 DIAGNOSIS — K5900 Constipation, unspecified: Secondary | ICD-10-CM

## 2020-01-10 DIAGNOSIS — E039 Hypothyroidism, unspecified: Secondary | ICD-10-CM

## 2020-01-10 DIAGNOSIS — J42 Unspecified chronic bronchitis: Secondary | ICD-10-CM

## 2020-01-10 DIAGNOSIS — K219 Gastro-esophageal reflux disease without esophagitis: Secondary | ICD-10-CM

## 2020-01-10 NOTE — Assessment & Plan Note (Signed)
Persisted gradual weight loss, dietary increased boost plus tid with meals, already taking Mirtazapine.

## 2020-01-10 NOTE — Assessment & Plan Note (Signed)
Hypothyroidism, stable, on Levothyroxine 60mcg qd.TSH 1.03 09/28/19

## 2020-01-10 NOTE — Assessment & Plan Note (Signed)
GERD, stable, on Famotidine 20mg  qd.

## 2020-01-10 NOTE — Assessment & Plan Note (Signed)
COPD, stable,takesprn Albuterol HFA, prn Tessalon, Advair bid, saline nasal spray.

## 2020-01-10 NOTE — Progress Notes (Addendum)
Location:    [facility] Room Number: 25 Place of Service:  SNF (31) Provider: Marlana Latus NP  Toniyah Dilmore X, NP  Patient Care Team: Lars Jeziorski X, NP as PCP - General (Internal Medicine) Newton Pigg, MD as Consulting Physician (Obstetrics and Gynecology) Monna Fam, MD as Consulting Physician (Ophthalmology) Melida Quitter, MD as Consulting Physician (Otolaryngology) Myrlene Broker, MD as Consulting Physician (Urology) [place] Virgie Dad, MD as Consulting Physician (Internal Medicine)  Extended Emergency Contact Information Primary Emergency Contact: KimberlyArthur Address: [place], [place] Phone: [phone] Work Phone: [phone] Mobile Phone: [phone] Relation: Son Secondary Emergency Contact: Teofilo [place] Phone: [phone] Work Phone: [phone] Relation: Friend  Code Status:  DNR Goals of care: Advanced Directive information Advanced Directives 02/16/2020  Does Patient Have a Medical Advance Directive? Yes  Type of Advance Directive Out of facility DNR (pink MOST or yellow form);Living will;Healthcare Power of Attorney  Does patient want to make changes to medical advance directive? No - Patient declined  Copy of [place] in Chart? Yes - validated most recent copy scanned in chart (See row information)  Pre-existing out of facility DNR order (yellow form or pink MOST form) Pink MOST form placed in chart (order not valid for inpatient use);Yellow form placed in chart (order not valid for inpatient use)     Chief Complaint  Patient presents with  . Medical Management of Chronic Issues    HPI:  Pt is a 86 y.o. female seen today for medical management of chronic diseases.   Persisted gradual weight loss, dietary increased boost plus tid with meals, already taking Mirtazapine.  Dementia,her  goal of care is comfort measures, MOST no IVF/ABT.  Hx of constipation, stable, on MiraLax qd.  Her mood is stable, on Mirtazapine 15mg  qd, Escitalopram 20mg  qd.  Hypothyroidism, stable, on Levothyroxine 49mcg qd.TSH 1.03 09/28/19 COPD, stable,takesprn Albuterol HFA, prn Tessalon, Advair bid, saline nasal spray.  GERD, stable, on Famotidine 20mg  qd.     Past Medical History:  Diagnosis Date  . Asthma   . Carpal tunnel syndrome 03/07/2009  . Disturbance of skin sensation 02/21/2009  . Diverticulosis of colon (without mention of hemorrhage) 02/16/2009  . Fracture of upper end of left humerus 11/18/14  . 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 Hypothyroid   . Osteoarthrosis, unspecified whether generalized or localized, unspecified site 02/16/2009  . Other abnormal blood chemistry 06/19/2010   hyperglycemia  . Other and unspecified hyperlipidemia 10/24/2009  . Other atopic dermatitis and related conditions 02/16/2009  . Otosclerosis, unspecified 02/22/2003  . Pain in limb 12/05/2009  . Senile osteoporosis 06/16/2010  . Sensorineural hearing loss, unspecified 02/21/2009  . Syncope and collapse 05/30/2009  . Unspecified nasal polyp 02/21/2009  . Unspecified urinary incontinence 02/21/2009   Past Surgical History:  Procedure Laterality Date  . ABDOMINAL HYSTERECTOMY  1999   Dr. Collier Bullock  . BREAST BIOPSY  07/16/1990   benign left breast needle biopsy  Dr. Margot Chimes  . CARPAL TUNNEL RELEASE  04/07/2009   right Dr. Daylene Katayama  . CATARACT EXTRACTION W/ INTRAOCULAR LENS  IMPLANT, BILATERAL Bilateral   . INTRAMEDULLARY (IM) NAIL INTERTROCHANTERIC Right 09/17/2017   Procedure: INTRAMEDULLARY (IM) NAIL INTERTROCHANTRIC HIP;  Surgeon: Nicholes Stairs, MD;  Location: [facility];  Service: Orthopedics;  Laterality: Right;  . OPEN REDUCTION INTERNAL FIXATION (ORIF)  DISTAL RADIAL FRACTURE Right 09/17/2017   Procedure: OPEN REDUCTION INTERNAL  FIXATION (ORIF) DISTAL RADIAL FRACTURE;  Surgeon: Iran Planas, MD;  Location: [facility];  Service: Orthopedics;  Laterality: Right;  . TRIGGER FINGER RELEASE Left 08/09/2014   Procedure: RELEASE TRIGGER FINGER/A-1 PULLEY LEFT MIDDLE FINGER;  Surgeon: Daryll Brod, MD;  Location: [facility];  Service: Orthopedics;  Laterality: Left;  REGIONAL/FAB    Allergies  Allergen Reactions  . Sulfa Antibiotics Itching, Rash and Other (See Comments)    "Allergic," per MAR  . Amoxicillin Other (See Comments)    Per [facility] 09/15/17 Has patient had a PCN reaction causing immediate rash, facial/tongue/throat swelling, SOB or lightheadedness with hypotension: Unknown Has patient had a PCN reaction causing severe rash involving mucus membranes or skin necrosis: Unknown Has patient had a PCN reaction that required hospitalization: Unknown Has patient had a PCN reaction occurring within the last 10 years: Unknown If all of the above answers are "NO", then may proceed with Cephalosporin use.  . Avelox [Moxifloxacin Hcl In Nacl] Itching  . Doxycycline Other (See Comments)    "Allergic," per MAR  . Hista-Tabs [Triprolidine-Pse] Other (See Comments)    "Passed out"  . Pyrilamine-Phenylephrine Other (See Comments)    "Allergic," per [facility] 09/15/17  . Septra [Sulfamethoxazole-Trimethoprim] Itching, Rash and Other (See Comments)    "Allergic," per MAR    Allergies as of 01/10/2020      Reactions   Sulfa Antibiotics Itching, Rash, Other (See Comments)   "Allergic," per MAR   Amoxicillin Other (See Comments)   Per [facility] 09/15/17 Has patient had a PCN reaction causing immediate rash, facial/tongue/throat swelling, SOB or lightheadedness with hypotension: Unknown Has patient had a PCN reaction causing severe rash involving mucus membranes or skin necrosis: Unknown Has patient had a PCN reaction that required hospitalization: Unknown Has patient had a PCN reaction occurring within the last 10 years: Unknown If all  of the above answers are "NO", then may proceed with Cephalosporin use.   Avelox [moxifloxacin Hcl In Nacl] Itching   Doxycycline Other (See Comments)   "Allergic," per MAR   Hista-tabs [triprolidine-pse] Other (See Comments)   "Passed out"   Pyrilamine-phenylephrine Other (See Comments)   "Allergic," per [facility] 09/15/17   Septra [sulfamethoxazole-trimethoprim] Itching, Rash, Other (See Comments)   "Allergic," per [facility]      Medication List       Accurate as of January 10, 2020 11:59 PM. If you have any questions, ask your nurse or doctor.        acetaminophen 325 MG tablet Commonly known as: TYLENOL Take 650 mg by mouth every 6 (six) hours as needed (for pain).   albuterol 108 (90 Base) MCG/ACT inhaler Commonly known as: VENTOLIN HFA Inhale 2 puffs into the lungs every 6 (six) hours as needed for shortness of breath (dyspnea).   benzonatate 100 MG capsule Commonly known as: TESSALON Take by mouth 3 (three) times daily as needed for cough.   chlorhexidine 0.12 % solution Commonly known as: PERIDEX Use as directed 15 mLs in the mouth or throat daily.   escitalopram 20 MG tablet Commonly known as: LEXAPRO Take 20 mg by mouth daily. In the morning 7am-11am.   famotidine 20 MG tablet Commonly known as: PEPCID Take 20 mg by mouth daily. In the morning 7am-11am.   Fluticasone-Salmeterol 250-50 MCG/DOSE Aepb Commonly known as: ADVAIR Inhale 1 puff into the lungs 2 (two) times daily. Morning: 7am-11am Evening: 4pm-7pm.  Rinse mouth after use  lactose free nutrition Liqd Take 237 mLs by mouth in the morning, at noon, and at bedtime. Add 237 ml's to ice cream and consume two times a day. 10am & 4pm.   levothyroxine 25 MCG tablet Commonly known as: SYNTHROID Take 25 mcg by mouth daily before breakfast. 7:30am.   mirtazapine 15 MG tablet Commonly known as: REMERON Take 15 mg by mouth daily. 7:30pm-11pm.   OXYGEN Inhale 2 L/min into the lungs as needed (to keep O2 SATS  >90%).   polyethylene glycol 17 g packet Commonly known as: MIRALAX / GLYCOLAX Take 17 g by mouth daily. Mix with 6-8 ounces of fluid of choice. Once daily 7am-11:00am.   polyvinyl alcohol 1.4 % ophthalmic solution Commonly known as: LIQUIFILM TEARS Place 1 drop into both eyes 3 (three) times daily.   sodium chloride 0.65 % nasal spray Commonly known as: OCEAN Place 1 spray into the nose 2 (two) times daily as needed for congestion.   sodium fluoride 1.1 % Gel dental gel Commonly known as: FLUORISHIELD Place 1 application onto teeth at bedtime. brush on teeth with toothbrush after PM MOUTHCARE . SPIT OUT EXCESS AND DO NOT RINSE   zinc oxide 20 % ointment Apply to buttocks as needed every shift after incontinent episodes       Review of Systems  Constitutional: Positive for unexpected weight change. Negative for activity change, appetite change and fever.       #3Ibs weight loss in the past 2-3 weeks.   HENT: Positive for congestion and hearing loss. Negative for voice change.   Eyes: Negative for visual disturbance.  Respiratory: Positive for cough. Negative for shortness of breath and wheezing.        DOE  Cardiovascular: Negative for leg swelling.  Gastrointestinal: Negative for abdominal pain and constipation.  Genitourinary: Negative for difficulty urinating, dysuria and urgency.  Musculoskeletal: Positive for arthralgias and gait problem.  Skin: Negative for color change.  Neurological: Negative for syncope, weakness and light-headedness.       Memory lapses.  Psychiatric/Behavioral: Negative for agitation and sleep disturbance. The patient is not nervous/anxious.     Immunization History  Administered Date(s) Administered  . Influenza Whole 12/27/2011, 11/05/2012  . Influenza, High Dose Seasonal PF 11/19/2018  . Influenza-Unspecified 11/18/2013, 11/03/2014, 11/16/2015, 11/13/2016, 11/17/2017, 11/09/2019  . Moderna Sars-Covid-2 Vaccination 02/08/2019, 03/08/2019,  12/20/2019  . PPD Test 04/04/2008, 11/23/2014  . Pneumococcal Conjugate-13 09/30/2016  . Pneumococcal Polysaccharide-23 02/05/2008, 06/03/2008  . Td 02/05/2008, 06/03/2008   Pertinent  Health Maintenance Due  Topic Date Due  . INFLUENZA VACCINE  Completed  . DEXA SCAN  Completed  . PNA vac Low Risk Adult  Completed   Fall Risk  10/09/2017 09/23/2016 08/27/2016 05/30/2015 05/16/2015  Falls in the past year? Yes Yes Yes Yes No  Comment - - [facility] Telephone Survey: data to providers prior to load - -  Number falls in past yr: 2 or more 1 1 1  -  Comment - - Emmi Telephone Survey Actual Response = 1 - -  Injury with Fall? Yes No No Yes -  Comment broke R wrist - - - -  Risk Factor Category  - - - - -  Risk for fall due to : - - - History of fall(s);Impaired balance/gait;Impaired mobility -  Follow up - - - Falls evaluation completed;Education provided -   Functional Status Survey:    Vitals:   01/10/20 1544  BP: 135/74  Pulse: (!) 56  Temp: (!) 97.4 F (36.3  C)  SpO2: 94%  Weight: 120 lb 4.8 oz (54.6 kg)  Height: 4\' 8"  (1.422 m)   Body mass index is 26.97 kg/m. Physical Exam Vitals and nursing note reviewed.  Constitutional:      Appearance: Normal appearance.  HENT:     Head: Normocephalic and atraumatic.     Mouth/Throat:     Mouth: Mucous membranes are moist.  Eyes:     Extraocular Movements: Extraocular movements intact.     Conjunctiva/sclera: Conjunctivae normal.     Pupils: Pupils are equal, round, and reactive to light.  Cardiovascular:     Rate and Rhythm: Normal rate and regular rhythm.     Heart sounds: No murmur heard.   Pulmonary:     Breath sounds: No rales.  Abdominal:     General: Bowel sounds are normal.     Palpations: Abdomen is soft.     Tenderness: There is no abdominal tenderness.  Musculoskeletal:     Cervical back: Normal range of motion and neck supple.     Right lower leg: No edema.     Left lower leg: No edema.     Comments: R shoulder  limited over head ROM  Skin:    General: Skin is warm and dry.  Neurological:     General: No focal deficit present.     Mental Status: She is alert. Mental status is at baseline.     Gait: Gait abnormal.     Comments: Oriented to person  Psychiatric:        Mood and Affect: Mood normal.        Behavior: Behavior normal.        Thought Content: Thought content normal.     Comments: Followed simple directions, smiled during today's examination     Labs reviewed: Recent Labs    03/24/19 0000 06/17/19 0000 09/28/19 0000  NA 141 143 143  K 4.3 4.0 4.2  CL 108 110* 110*  CO2 25* 26* 22  BUN 20 27* 12  CREATININE 1.0 1.0 0.9  CALCIUM 9.1 9.3 8.8   Recent Labs    03/24/19 0000 06/17/19 0000 09/28/19 0000  AST 20 14 16   ALT 18 11 13   ALKPHOS 57 45 54  ALBUMIN 3.5 3.6 3.2*   Recent Labs    03/24/19 0000 06/17/19 0000 09/28/19 0000  WBC 7.3 5.4 6.9  NEUTROABS 3,329 2,117 3,064  HGB 13.8 12.9 12.9  HCT 41 39 39  PLT 232 224 195   Lab Results  Component Value Date   TSH 1.03 09/28/2019   Lab Results  Component Value Date   HGBA1C 6.0 06/17/2019   Lab Results  Component Value Date   CHOL 211 (A) 02/14/2014   HDL 47 02/14/2014   LDLCALC 133 02/14/2014   TRIG 155 02/14/2014    Significant Diagnostic Results in last 30 days:  No results found.  Assessment/Plan Adult failure to thrive Persisted gradual weight loss, dietary increased boost plus tid with meals, already taking Mirtazapine.    Cognitive impairment Dementia,her goal of care is comfort measures, MOST no IVF/ABT.   Constipation Hx of constipation, stable, on MiraLax qd.   Depression, recurrent ([place]) Her mood is stable, on Mirtazapine 15mg  qd, Escitalopram 20mg  qd.   Hypothyroid Hypothyroidism, stable, on Levothyroxine 44mcg qd.TSH 1.03 09/28/19  COPD (chronic obstructive pulmonary disease) (HCC) COPD, stable,takesprn Albuterol HFA, prn Tessalon, Advair bid, saline nasal spray.     GERD (gastroesophageal reflux disease) GERD, stable, on Famotidine 20mg   qd.      Family/ staff Communication: plan of care reviewed with the patient and charge nurse.   Labs/tests ordered: none  Time spend 25 minutes.

## 2020-01-10 NOTE — Assessment & Plan Note (Signed)
Dementia,her goal of care is comfort measures, MOST no IVF/ABT.

## 2020-01-10 NOTE — Assessment & Plan Note (Signed)
Her mood is stable, on Mirtazapine 15mg qd, Escitalopram 20mg qd.  

## 2020-01-10 NOTE — Assessment & Plan Note (Signed)
Hx of constipation, stable, on MiraLax qd.

## 2020-02-16 ENCOUNTER — Non-Acute Institutional Stay (SKILLED_NURSING_FACILITY): Payer: Medicare Other | Admitting: Internal Medicine

## 2020-02-16 ENCOUNTER — Encounter: Payer: Self-pay | Admitting: Internal Medicine

## 2020-02-16 DIAGNOSIS — R627 Adult failure to thrive: Secondary | ICD-10-CM | POA: Diagnosis not present

## 2020-02-16 DIAGNOSIS — F339 Major depressive disorder, recurrent, unspecified: Secondary | ICD-10-CM

## 2020-02-16 DIAGNOSIS — R001 Bradycardia, unspecified: Secondary | ICD-10-CM

## 2020-02-16 DIAGNOSIS — J42 Unspecified chronic bronchitis: Secondary | ICD-10-CM

## 2020-02-16 DIAGNOSIS — E039 Hypothyroidism, unspecified: Secondary | ICD-10-CM

## 2020-02-16 DIAGNOSIS — R4189 Other symptoms and signs involving cognitive functions and awareness: Secondary | ICD-10-CM

## 2020-02-16 DIAGNOSIS — K219 Gastro-esophageal reflux disease without esophagitis: Secondary | ICD-10-CM

## 2020-02-16 NOTE — Progress Notes (Signed)
Location:    [facility] Room Number: 25 Place of Service:  SNF (31) Provider:  Veleta Miners MD  Mast, Man X, NP  Patient Care Team: Mast, Man X, NP as PCP - General (Internal Medicine) Newton Pigg, MD as Consulting Physician (Obstetrics and Gynecology) Monna Fam, MD as Consulting Physician (Ophthalmology) Melida Quitter, MD as Consulting Physician (Otolaryngology) Myrlene Broker, MD as Consulting Physician (Urology) [place], [facility] Virgie Dad, MD as Consulting Physician (Internal Medicine)  Extended Emergency Contact Information Primary Emergency Contact: Brugh,[name] Address: [place], [place] Phone: [phone] Work Phone: [phone] Mobile Phone: [phone] Relation: Son Secondary Emergency Contact: Teofilo [place] Phone: [phone] Work Phone: [phone] Relation: Friend  Code Status:  DNR Goals of care: Advanced Directive information Advanced Directives 02/16/2020  Does Patient Have a Medical Advance Directive? Yes  Type of Advance Directive Out of facility DNR (pink MOST or yellow form);Living will;Healthcare Power of Attorney  Does patient want to make changes to medical advance directive? No - Patient declined  Copy of [place] in Chart? Yes - validated most recent copy scanned in chart (See row information)  Pre-existing out of facility DNR order (yellow form or pink MOST form) Pink MOST form placed in chart (order not valid for inpatient use);Yellow form placed in chart (order not valid for inpatient use)     Chief Complaint  Patient presents with  . Medical Management of Chronic Issues    HPI:  Pt is a 87 y.o. female seen today for medical management of chronic diseases.    Patient is a long-term care resident Patient has h/o Sinus Bradycardia, LE edema, Hypothyroidism, Depression, Chronic Sinusitis,  H/o Hip Fracture,And Proximal Humerus Fracture due to Fall   Staying stable Enrolled in hospice Weight has been stable recently No Complains today Denies pain or Cough or SOB  Past Medical History:  Diagnosis Date  . Asthma   . Carpal tunnel syndrome 03/07/2009  . Disturbance of skin sensation 02/21/2009  . Diverticulosis of colon (without mention of hemorrhage) 02/16/2009  . Fracture of upper end of left humerus 11/18/14  . 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 Hypothyroid   . Osteoarthrosis, unspecified whether generalized or localized, unspecified site 02/16/2009  . Other abnormal blood chemistry 06/19/2010   hyperglycemia  . Other and unspecified hyperlipidemia 10/24/2009  . Other atopic dermatitis and related conditions 02/16/2009  . Otosclerosis, unspecified 02/22/2003  . Pain in limb 12/05/2009  . Senile osteoporosis 06/16/2010  . Sensorineural hearing loss, unspecified 02/21/2009  . Syncope and collapse 05/30/2009  . Unspecified nasal polyp 02/21/2009  . Unspecified urinary incontinence 02/21/2009   Past Surgical History:  Procedure Laterality Date  . ABDOMINAL HYSTERECTOMY  1999   Dr. Collier Bullock  . BREAST BIOPSY  07/16/1990   benign left breast needle biopsy  Dr. Margot Chimes  . CARPAL TUNNEL RELEASE  04/07/2009   right Dr. Daylene Katayama  . CATARACT EXTRACTION W/ INTRAOCULAR LENS  IMPLANT, BILATERAL Bilateral   . INTRAMEDULLARY (IM) NAIL INTERTROCHANTERIC Right 09/17/2017   Procedure: INTRAMEDULLARY (IM) NAIL INTERTROCHANTRIC HIP;  Surgeon: Nicholes Stairs, MD;  Location: [facility];  Service: Orthopedics;  Laterality: Right;  . OPEN REDUCTION INTERNAL FIXATION (ORIF) DISTAL RADIAL FRACTURE Right 09/17/2017   Procedure: OPEN REDUCTION INTERNAL FIXATION (ORIF) DISTAL RADIAL FRACTURE;  Surgeon: Iran Planas, MD;  Location: [facility];  Service: Orthopedics;  Laterality: Right;  . TRIGGER FINGER RELEASE Left 08/09/2014   Procedure: RELEASE TRIGGER FINGER/A-1 PULLEY LEFT MIDDLE FINGER;  Surgeon: Cindee Salt,  MD;  Location: [facility];  Service: Orthopedics;  Laterality: Left;  REGIONAL/FAB    Allergies  Allergen Reactions  . Sulfa Antibiotics Itching, Rash and Other (See Comments)    "Allergic," per MAR  . Amoxicillin Other (See Comments)    Per [facility] 09/15/17 Has patient had a PCN reaction causing immediate rash, facial/tongue/throat swelling, SOB or lightheadedness with hypotension: Unknown Has patient had a PCN reaction causing severe rash involving mucus membranes or skin necrosis: Unknown Has patient had a PCN reaction that required hospitalization: Unknown Has patient had a PCN reaction occurring within the last 10 years: Unknown If all of the above answers are "NO", then may proceed with Cephalosporin use.  . Avelox [Moxifloxacin Hcl In Nacl] Itching  . Doxycycline Other (See Comments)    "Allergic," per MAR  . Hista-Tabs [Triprolidine-Pse] Other (See Comments)    "Passed out"  . Pyrilamine-Phenylephrine Other (See Comments)    "Allergic," per [facility] 09/15/17  . Septra [Sulfamethoxazole-Trimethoprim] Itching, Rash and Other (See Comments)    "Allergic," per MAR    Allergies as of 02/16/2020      Reactions   Sulfa Antibiotics Itching, Rash, Other (See Comments)   "Allergic," per MAR   Amoxicillin Other (See Comments)   Per [facility] 09/15/17 Has patient had a PCN reaction causing immediate rash, facial/tongue/throat swelling, SOB or lightheadedness with hypotension: Unknown Has patient had a PCN reaction causing severe rash involving mucus membranes or skin necrosis: Unknown Has patient had a PCN reaction that required hospitalization: Unknown Has patient had a PCN reaction occurring within the last 10 years: Unknown If all of the above answers are "NO", then may proceed with Cephalosporin use.   Avelox [moxifloxacin Hcl In Nacl] Itching   Doxycycline Other (See Comments)   "Allergic," per MAR   Hista-tabs [triprolidine-pse] Other (See Comments)   "Passed out"    Pyrilamine-phenylephrine Other (See Comments)   "Allergic," per [facility] 09/15/17   Septra [sulfamethoxazole-trimethoprim] Itching, Rash, Other (See Comments)   "Allergic," per [facility]      Medication List       Accurate as of February 16, 2020 10:39 AM. If you have any questions, ask your nurse or doctor.        acetaminophen 325 MG tablet Commonly known as: TYLENOL Take 650 mg by mouth every 6 (six) hours as needed (for pain).   albuterol 108 (90 Base) MCG/ACT inhaler Commonly known as: VENTOLIN HFA Inhale 2 puffs into the lungs every 6 (six) hours as needed for shortness of breath (dyspnea).   benzonatate 100 MG capsule Commonly known as: TESSALON Take by mouth 3 (three) times daily as needed for cough.   chlorhexidine 0.12 % solution Commonly known as: PERIDEX Use as directed 15 mLs in the mouth or throat daily.   escitalopram 20 MG tablet Commonly known as: LEXAPRO Take 20 mg by mouth daily. In the morning 7am-11am.   famotidine 20 MG tablet Commonly known as: PEPCID Take 20 mg by mouth daily. In the morning 7am-11am.   Fluticasone-Salmeterol 250-50 MCG/DOSE Aepb Commonly known as: ADVAIR Inhale 1 puff into the lungs 2 (two) times daily. Morning: 7am-11am Evening: 4pm-7pm.  Rinse mouth after use   lactose free nutrition Liqd Take 237 mLs by mouth in the morning, at noon, and at bedtime. Add 237 ml's to ice cream and consume  two times a day. 10am & 4pm.   levothyroxine 25 MCG tablet Commonly known as: SYNTHROID Take 25 mcg by mouth daily before breakfast. 7:30am.   mirtazapine 15 MG tablet Commonly known as: REMERON Take 15 mg by mouth daily. 7:30pm-11pm.   OXYGEN Inhale 2 L/min into the lungs as needed (to keep O2 SATS >90%).   polyethylene glycol 17 g packet Commonly known as: MIRALAX / GLYCOLAX Take 17 g by mouth daily. Mix with 6-8 ounces of fluid of choice. Once daily 7am-11:00am.   polyvinyl alcohol 1.4 % ophthalmic solution Commonly known as: LIQUIFILM  TEARS Place 1 drop into both eyes 3 (three) times daily.   sodium chloride 0.65 % nasal spray Commonly known as: OCEAN Place 1 spray into the nose 2 (two) times daily as needed for congestion.   sodium fluoride 1.1 % Gel dental gel Commonly known as: FLUORISHIELD Place 1 application onto teeth at bedtime. brush on teeth with toothbrush after PM MOUTHCARE . SPIT OUT EXCESS AND DO NOT RINSE   zinc oxide 20 % ointment Apply to buttocks as needed every shift after incontinent episodes       Review of Systems  Constitutional: Negative for activity change, appetite change and unexpected weight change.  HENT: Negative.   Respiratory: Positive for cough.   Cardiovascular: Negative.   Gastrointestinal: Negative.   Genitourinary: Negative.   Musculoskeletal: Positive for gait problem.  Skin: Negative.   Neurological: Positive for weakness.  Psychiatric/Behavioral: Positive for confusion.    Immunization History  Administered Date(s) Administered  . Influenza Whole 12/27/2011, 11/05/2012  . Influenza, High Dose Seasonal PF 11/19/2018  . Influenza-Unspecified 11/18/2013, 11/03/2014, 11/16/2015, 11/13/2016, 11/17/2017, 11/09/2019  . Moderna Sars-Covid-2 Vaccination 02/08/2019, 03/08/2019, 12/20/2019  . PPD Test 04/04/2008, 11/23/2014  . Pneumococcal Conjugate-13 09/30/2016  . Pneumococcal Polysaccharide-23 02/05/2008, 06/03/2008  . Td 02/05/2008, 06/03/2008   Pertinent  Health Maintenance Due  Topic Date Due  . INFLUENZA VACCINE  Completed  . DEXA SCAN  Completed  . PNA vac Low Risk Adult  Completed   Fall Risk  10/09/2017 09/23/2016 08/27/2016 05/30/2015 05/16/2015  Falls in the past year? Yes Yes Yes Yes No  Comment - - [facility] Telephone Survey: data to providers prior to load - -  Number falls in past yr: 2 or more 1 1 1  -  Comment - - [facility]i Telephone Survey Actual Response = 1 - -  Injury with Fall? Yes No No Yes -  Comment broke R wrist - - - -  Risk Factor Category  - - - - -   Risk for fall due to : - - - History of fall(s);Impaired balance/gait;Impaired mobility -  Follow up - - - Falls evaluation completed;Education provided -   Functional Status Survey:    Vitals:   02/16/20 1031  BP: (!) 129/57  Pulse: (!) 53  Resp: 20  Temp: 97.7 F (36.5 C)  SpO2: 93%  Weight: 122 lb 1.6 oz (55.4 kg)  Height: 4\' 8"  (1.422 m)   Body mass index is 27.37 kg/m. Physical Exam Vitals reviewed.  Constitutional:      Appearance: Normal appearance.  HENT:     Head: Normocephalic.     Nose: Nose normal.     Mouth/Throat:     Mouth: Mucous membranes are moist.     Pharynx: Oropharynx is clear.  Eyes:     Pupils: Pupils are equal, round, and reactive to light.  Cardiovascular:     Rate and Rhythm: Normal rate and  regular rhythm.     Pulses: Normal pulses.  Pulmonary:     Effort: Pulmonary effort is normal. No respiratory distress.     Breath sounds: Normal breath sounds. No wheezing.  Abdominal:     General: Abdomen is flat. Bowel sounds are normal.     Palpations: Abdomen is soft.  Musculoskeletal:        General: No swelling.     Cervical back: Neck supple.  Skin:    General: Skin is warm.  Neurological:     General: No focal deficit present.     Mental Status: She is alert.     Comments: Responses appropriately Stays in her room mostly  Psychiatric:        Mood and Affect: Mood normal.        Thought Content: Thought content normal.     Labs reviewed: Recent Labs    03/24/19 0000 06/17/19 0000 09/28/19 0000  NA 141 143 143  K 4.3 4.0 4.2  CL 108 110* 110*  CO2 25* 26* 22  BUN 20 27* 12  CREATININE 1.0 1.0 0.9  CALCIUM 9.1 9.3 8.8   Recent Labs    03/24/19 0000 06/17/19 0000 09/28/19 0000  AST 20 14 16   ALT 18 11 13   ALKPHOS 57 45 54  ALBUMIN 3.5 3.6 3.2*   Recent Labs    03/24/19 0000 06/17/19 0000 09/28/19 0000  WBC 7.3 5.4 6.9  NEUTROABS 3,329 2,117 3,064  HGB 13.8 12.9 12.9  HCT 41 39 39  PLT 232 224 195   Lab  Results  Component Value Date   TSH 1.03 09/28/2019   Lab Results  Component Value Date   HGBA1C 6.0 06/17/2019   Lab Results  Component Value Date   CHOL 211 (A) 02/14/2014   HDL 47 02/14/2014   LDLCALC 133 02/14/2014   TRIG 155 02/14/2014    Significant Diagnostic Results in last 30 days:  No results found.  Assessment/Plan Chronic bronchitis, unspecified chronic bronchitis type ([place]) Doing well on Nasal spray and Advair No aggressive measures No Antibiotics per family Adult failure to thrive Weight stable on Remeron Supportive care Enrolled in Hospice Cognitive impairment Supportive care Depression, recurrent ([place]) On LExapro Acquired hypothyroidism TSH normal in 8/21 Gastroesophageal reflux disease without esophagitis On Pepcid Sinus bradycardia Stable    Family/ staff Communication:   Labs/tests ordered:

## 2020-03-21 ENCOUNTER — Encounter: Payer: Self-pay | Admitting: Nurse Practitioner

## 2020-03-21 ENCOUNTER — Non-Acute Institutional Stay (SKILLED_NURSING_FACILITY): Payer: Medicare Other | Admitting: Nurse Practitioner

## 2020-03-21 DIAGNOSIS — J410 Simple chronic bronchitis: Secondary | ICD-10-CM | POA: Diagnosis not present

## 2020-03-21 DIAGNOSIS — F339 Major depressive disorder, recurrent, unspecified: Secondary | ICD-10-CM

## 2020-03-21 DIAGNOSIS — E039 Hypothyroidism, unspecified: Secondary | ICD-10-CM

## 2020-03-21 DIAGNOSIS — K5900 Constipation, unspecified: Secondary | ICD-10-CM | POA: Diagnosis not present

## 2020-03-21 DIAGNOSIS — R4189 Other symptoms and signs involving cognitive functions and awareness: Secondary | ICD-10-CM

## 2020-03-21 DIAGNOSIS — R627 Adult failure to thrive: Secondary | ICD-10-CM

## 2020-03-21 DIAGNOSIS — K219 Gastro-esophageal reflux disease without esophagitis: Secondary | ICD-10-CM

## 2020-03-21 NOTE — Progress Notes (Signed)
Location:    [place] Room Number: 25 Place of Service:  SNF (31) Provider:  Marlana Latus NP  Jessicamarie Amiri X, NP  Patient Care Team: Ivie Maese X, NP as PCP - General (Internal Medicine) [place] Pigg, MD as Consulting Physician (Obstetrics and Gynecology) Monna Fam, MD as Consulting Physician (Ophthalmology) Melida Quitter, MD as Consulting Physician (Otolaryngology) Myrlene Broker, MD as Consulting Physician (Urology) [place], [facility] Virgie Dad, MD as Consulting Physician (Internal Medicine)  Extended Emergency Contact Information Primary Emergency Contact: Derrick,[name] Address: [place], [place] Phone: [phone] Work Phone: [phone] Mobile Phone: [phone] Relation: Son Secondary Emergency Contact: Teofilo [place] Phone: [phone] Work Phone: [phone] Relation: Friend  Code Status:  DNR Hospice Goals of care: Advanced Directive information Advanced Directives 02/16/2020  Does Patient Have a Medical Advance Directive? Yes  Type of Advance Directive Out of facility DNR (pink MOST or yellow form);Living will;Healthcare Power of Attorney  Does patient want to make changes to medical advance directive? No - Patient declined  Copy of [place] in Chart? Yes - validated most recent copy scanned in chart (See row information)  Pre-existing out of facility DNR order (yellow form or pink MOST form) Pink MOST form placed in chart (order not valid for inpatient use);Yellow form placed in chart (order not valid for inpatient use)     Chief Complaint  Patient presents with  . Medical Management of Chronic Issues  . Health Maintenance    TDAP    HPI:  Pt is a 87 y.o. female seen today for medical management of chronic diseases.    Persisted gradual weight loss, dietary supplement, already taking Mirtazapine.   Dementia,her goal of care is comfort measures, MOST no IVF/ABT.  Hx of constipation, stable, on MiraLax qd.  Her mood is stable, on Mirtazapine 15mg  qd, Escitalopram 20mg  qd.  Hypothyroidism, stable, on Levothyroxine 101mcg qd.TSH 1.03 09/28/19 COPD, stable,takesprn Albuterol HFA, prn Tessalon, Advair bid, saline nasal spray. No ABT GERD, stable, on Famotidine 20mg  qd.  Past Medical History:  Diagnosis Date  . Asthma   . Carpal tunnel syndrome 03/07/2009  . Disturbance of skin sensation 02/21/2009  . Diverticulosis of colon (without mention of hemorrhage) 02/16/2009  . Fracture of upper end of left humerus 11/18/14  . 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 Hypothyroid   . Osteoarthrosis, unspecified whether generalized or localized, unspecified site 02/16/2009  . Other abnormal blood chemistry 06/19/2010   hyperglycemia  . Other and unspecified hyperlipidemia 10/24/2009  . Other atopic dermatitis and related conditions 02/16/2009  . Otosclerosis, unspecified 02/22/2003  . Pain in limb 12/05/2009  . Senile osteoporosis 06/16/2010  . Sensorineural hearing loss, unspecified 02/21/2009  . Syncope and collapse 05/30/2009  . Unspecified nasal polyp 02/21/2009  . Unspecified urinary incontinence 02/21/2009   Past Surgical History:  Procedure Laterality Date  . ABDOMINAL HYSTERECTOMY  1999   Dr. Collier Bullock  . BREAST BIOPSY  07/16/1990   benign left breast needle biopsy  Dr. Margot Chimes  . CARPAL TUNNEL RELEASE  04/07/2009   right Dr. Daylene Katayama  . CATARACT EXTRACTION W/ INTRAOCULAR LENS  IMPLANT, BILATERAL Bilateral   . INTRAMEDULLARY (IM) NAIL INTERTROCHANTERIC Right 09/17/2017   Procedure: INTRAMEDULLARY (IM) NAIL INTERTROCHANTRIC HIP;  Surgeon: Nicholes Stairs, MD;  Location: [place];  Service: Orthopedics;  Laterality: Right;  . OPEN  REDUCTION INTERNAL FIXATION (ORIF) DISTAL RADIAL FRACTURE Right 09/17/2017   Procedure:  OPEN REDUCTION INTERNAL FIXATION (ORIF) DISTAL RADIAL FRACTURE;  Surgeon: Iran Planas, MD;  Location: [place];  Service: Orthopedics;  Laterality: Right;  . TRIGGER FINGER RELEASE Left 08/09/2014   Procedure: RELEASE TRIGGER FINGER/A-1 PULLEY LEFT MIDDLE FINGER;  Surgeon: Daryll Brod, MD;  Location: [facility];  Service: Orthopedics;  Laterality: Left;  REGIONAL/FAB    Allergies  Allergen Reactions  . Sulfa Antibiotics Itching, Rash and Other (See Comments)    "Allergic," per MAR  . Amoxicillin Other (See Comments)    Per [facility] 09/15/17 Has patient had a PCN reaction causing immediate rash, facial/tongue/throat swelling, SOB or lightheadedness with hypotension: Unknown Has patient had a PCN reaction causing severe rash involving mucus membranes or skin necrosis: Unknown Has patient had a PCN reaction that required hospitalization: Unknown Has patient had a PCN reaction occurring within the last 10 years: Unknown If all of the above answers are "NO", then may proceed with Cephalosporin use.  . Avelox [Moxifloxacin Hcl In Nacl] Itching  . Doxycycline Other (See Comments)    "Allergic," per MAR  . Hista-Tabs [Triprolidine-Pse] Other (See Comments)    "Passed out"  . Pyrilamine-Phenylephrine Other (See Comments)    "Allergic," per [facility] 09/15/17  . Septra [Sulfamethoxazole-Trimethoprim] Itching, Rash and Other (See Comments)    "Allergic," per MAR    Allergies as of 03/21/2020      Reactions   Sulfa Antibiotics Itching, Rash, Other (See Comments)   "Allergic," per MAR   Amoxicillin Other (See Comments)   Per [facility] 09/15/17 Has patient had a PCN reaction causing immediate rash, facial/tongue/throat swelling, SOB or lightheadedness with hypotension: Unknown Has patient had a PCN reaction causing severe rash involving mucus membranes or skin necrosis: Unknown Has patient had a PCN reaction that required hospitalization: Unknown Has patient had a PCN reaction occurring within the last  10 years: Unknown If all of the above answers are "NO", then may proceed with Cephalosporin use.   Avelox [moxifloxacin Hcl In Nacl] Itching   Doxycycline Other (See Comments)   "Allergic," per MAR   Hista-tabs [triprolidine-pse] Other (See Comments)   "Passed out"   Pyrilamine-phenylephrine Other (See Comments)   "Allergic," per [facility] 09/15/17   Septra [sulfamethoxazole-trimethoprim] Itching, Rash, Other (See Comments)   "Allergic," per [facility]      Medication List       Accurate as of March 21, 2020 11:59 PM. If you have any questions, ask your nurse or doctor.        acetaminophen 325 MG tablet Commonly known as: TYLENOL Take 650 mg by mouth every 6 (six) hours as needed (for pain).   albuterol 108 (90 Base) MCG/ACT inhaler Commonly known as: VENTOLIN HFA Inhale 2 puffs into the lungs every 6 (six) hours as needed for shortness of breath (dyspnea).   benzonatate 100 MG capsule Commonly known as: TESSALON Take by mouth 3 (three) times daily as needed for cough.   chlorhexidine 0.12 % solution Commonly known as: PERIDEX Use as directed 15 mLs in the mouth or throat daily.   escitalopram 20 MG tablet Commonly known as: LEXAPRO Take 20 mg by mouth daily. In the morning 7am-11am.   famotidine 20 MG tablet Commonly known as: PEPCID Take 20 mg by mouth daily. In the morning 7am-11am.   Fluticasone-Salmeterol 250-50 MCG/DOSE Aepb Commonly known as: ADVAIR Inhale 1 puff into the lungs 2 (two) times daily. Morning: 7am-11am Evening: 4pm-7pm.  Rinse  mouth after use   lactose free nutrition Liqd Take 237 mLs by mouth in the morning, at noon, and at bedtime. Add 237 ml's to ice cream and consume two times a day. 10am & 4pm.   levothyroxine 25 MCG tablet Commonly known as: SYNTHROID Take 25 mcg by mouth daily before breakfast. 7:30am.   mirtazapine 15 MG tablet Commonly known as: REMERON Take 15 mg by mouth daily. 7:30pm-11pm.   OXYGEN Inhale 2 L/min into the lungs as  needed (to keep O2 SATS >90%).   polyethylene glycol 17 g packet Commonly known as: MIRALAX / GLYCOLAX Take 17 g by mouth daily. Mix with 6-8 ounces of fluid of choice. Once daily 7am-11:00am.   polyvinyl alcohol 1.4 % ophthalmic solution Commonly known as: LIQUIFILM TEARS Place 1 drop into both eyes 3 (three) times daily.   sodium chloride 0.65 % nasal spray Commonly known as: OCEAN Place 1 spray into the nose 2 (two) times daily as needed for congestion.   sodium fluoride 1.1 % Gel dental gel Commonly known as: FLUORISHIELD Place 1 application onto teeth at bedtime. brush on teeth with toothbrush after PM MOUTHCARE . SPIT OUT EXCESS AND DO NOT RINSE   zinc oxide 20 % ointment Apply to buttocks as needed every shift after incontinent episodes       Review of Systems  Constitutional: Positive for unexpected weight change. Negative for fatigue and fever.       #7Ibs weight loss in the past month.   HENT: Positive for congestion and hearing loss. Negative for voice change.   Eyes: Negative for visual disturbance.  Respiratory: Positive for cough. Negative for shortness of breath and wheezing.        DOE  Cardiovascular: Negative for leg swelling.  Gastrointestinal: Negative for abdominal pain and constipation.  Genitourinary: Negative for difficulty urinating, dysuria and urgency.  Musculoskeletal: Positive for arthralgias and gait problem.  Skin: Negative for color change.  Neurological: Negative for syncope, weakness and headaches.       Memory lapses.  Psychiatric/Behavioral: Negative for behavioral problems and sleep disturbance. The patient is not nervous/anxious.     Immunization History  Administered Date(s) Administered  . Influenza Whole 12/27/2011, 11/05/2012  . Influenza, High Dose Seasonal PF 11/19/2018  . Influenza-Unspecified 11/18/2013, 11/03/2014, 11/16/2015, 11/13/2016, 11/17/2017, 11/09/2019  . Moderna Sars-Covid-2 Vaccination 02/08/2019, 03/08/2019,  12/20/2019  . PPD Test 04/04/2008, 11/23/2014  . Pneumococcal Conjugate-13 09/30/2016  . Pneumococcal Polysaccharide-23 02/05/2008, 06/03/2008  . Td 02/05/2008, 06/03/2008   Pertinent  Health Maintenance Due  Topic Date Due  . INFLUENZA VACCINE  Completed  . DEXA SCAN  Completed  . PNA vac Low Risk Adult  Completed   Fall Risk  10/09/2017 09/23/2016 08/27/2016 05/30/2015 05/16/2015  Falls in the past year? Yes Yes Yes Yes No  Comment - - Emmi Telephone Survey: data to providers prior to load - -  Number falls in past yr: 2 or more 1 1 1  -  Comment - - Emmi Telephone Survey Actual Response = 1 - -  Injury with Fall? Yes No No Yes -  Comment broke R wrist - - - -  Risk Factor Category  - - - - -  Risk for fall due to : - - - History of fall(s);Impaired balance/gait;Impaired mobility -  Follow up - - - Falls evaluation completed;Education provided -   Functional Status Survey:    Vitals:   03/21/20 1544  BP: 132/62  Pulse: (!) 58  Resp: 18  Temp: 97.7 F (36.5 C)  SpO2: 93%  Weight: 115 lb 6.4 oz (52.3 kg)  Height: 4\' 8"  (1.422 m)   Body mass index is 25.87 kg/m. Physical Exam Vitals and nursing note reviewed.  Constitutional:      Appearance: Normal appearance.  HENT:     Head: Normocephalic and atraumatic.     Mouth/Throat:     Mouth: Mucous membranes are moist.  Eyes:     Extraocular Movements: Extraocular movements intact.     Conjunctiva/sclera: Conjunctivae normal.     Pupils: Pupils are equal, round, and reactive to light.  Cardiovascular:     Rate and Rhythm: Normal rate and regular rhythm.     Heart sounds: No murmur heard.   Pulmonary:     Breath sounds: No rales.  Abdominal:     General: Bowel sounds are normal.     Palpations: Abdomen is soft.     Tenderness: There is no abdominal tenderness.  Musculoskeletal:     Cervical back: Normal range of motion and neck supple.     Right lower leg: No edema.     Left lower leg: No edema.     Comments: R  shoulder limited over head ROM  Skin:    General: Skin is warm and dry.  Neurological:     General: No focal deficit present.     Mental Status: She is alert. Mental status is at baseline.     Gait: Gait abnormal.     Comments: Oriented to person  Psychiatric:        Mood and Affect: Mood normal.        Behavior: Behavior normal.        Thought Content: Thought content normal.     Comments: Followed simple directions, smiled during today's examination     Labs reviewed: Recent Labs    03/24/19 0000 06/17/19 0000 09/28/19 0000  NA 141 143 143  K 4.3 4.0 4.2  CL 108 110* 110*  CO2 25* 26* 22  BUN 20 27* 12  CREATININE 1.0 1.0 0.9  CALCIUM 9.1 9.3 8.8   Recent Labs    03/24/19 0000 06/17/19 0000 09/28/19 0000  AST 20 14 16   ALT 18 11 13   ALKPHOS 57 45 54  ALBUMIN 3.5 3.6 3.2*   Recent Labs    03/24/19 0000 06/17/19 0000 09/28/19 0000  WBC 7.3 5.4 6.9  NEUTROABS 3,329 2,117 3,064  HGB 13.8 12.9 12.9  HCT 41 39 39  PLT 232 224 195   Lab Results  Component Value Date   TSH 1.03 09/28/2019   Lab Results  Component Value Date   HGBA1C 6.0 06/17/2019   Lab Results  Component Value Date   CHOL 211 (A) 02/14/2014   HDL 47 02/14/2014   LDLCALC 133 02/14/2014   TRIG 155 02/14/2014    Significant Diagnostic Results in last 30 days:  No results found.  Assessment/Plan Constipation  stable, on MiraLax qd.    Depression, recurrent ([place]) Her mood is stable, on Mirtazapine 15mg  qd, Escitalopram 20mg  qd.   Hypothyroid stable, on Levothyroxine 54mcg qd.TSH 1.03 09/28/19  COPD (chronic obstructive pulmonary disease) (HCC) stable,takesprn Albuterol HFA, prn Tessalon, Advair bid, saline nasal spray. No ABT   GERD (gastroesophageal reflux disease) stable, on Famotidine 20mg  qd.  Cognitive impairment her goal of care is comfort measures, MOST no IVF/ABT.  Adult failure to thrive Persisted gradual weight loss, dietary supplement, already taking  Mirtazapine.     Family/ staff  Communication: plan of care reviewed with the patient and charge nurse.   Labs/tests ordered:  none  Time spend 25 minutes.

## 2020-03-22 ENCOUNTER — Encounter: Payer: Self-pay | Admitting: Nurse Practitioner

## 2020-03-22 NOTE — Assessment & Plan Note (Signed)
Persisted gradual weight loss, dietary supplement, already taking Mirtazapine.

## 2020-03-22 NOTE — Assessment & Plan Note (Signed)
her goal of care is comfort measures, MOST no IVF/ABT.

## 2020-03-22 NOTE — Assessment & Plan Note (Signed)
Her mood is stable, on Mirtazapine 15mg  qd, Escitalopram 20mg  qd.

## 2020-03-22 NOTE — Assessment & Plan Note (Signed)
stable, on Levothyroxine 54mcg qd.TSH 1.03 09/28/19

## 2020-03-22 NOTE — Assessment & Plan Note (Signed)
stable, on Famotidine 20mg qd. 

## 2020-03-22 NOTE — Assessment & Plan Note (Signed)
stable, on MiraLax qd. 

## 2020-03-22 NOTE — Assessment & Plan Note (Signed)
stable,takesprn Albuterol HFA, prn Tessalon, Advair bid, saline nasal spray. No ABT

## 2020-04-05 ENCOUNTER — Encounter: Payer: Self-pay | Admitting: Internal Medicine

## 2020-04-05 ENCOUNTER — Non-Acute Institutional Stay (SKILLED_NURSING_FACILITY): Payer: Medicare Other | Admitting: Internal Medicine

## 2020-04-05 DIAGNOSIS — E039 Hypothyroidism, unspecified: Secondary | ICD-10-CM | POA: Diagnosis not present

## 2020-04-05 DIAGNOSIS — R0602 Shortness of breath: Secondary | ICD-10-CM | POA: Diagnosis not present

## 2020-04-05 DIAGNOSIS — R058 Other specified cough: Secondary | ICD-10-CM

## 2020-04-05 DIAGNOSIS — J410 Simple chronic bronchitis: Secondary | ICD-10-CM

## 2020-04-05 DIAGNOSIS — F339 Major depressive disorder, recurrent, unspecified: Secondary | ICD-10-CM

## 2020-04-05 NOTE — Progress Notes (Signed)
Location: [facility] Room Number: 25 Place of Service:  SNF (31)  Provider:   Code Status:  Goals of Care:  Advanced Directives 02/16/2020  Does Patient Have a Medical Advance Directive? Yes  Type of Advance Directive Out of facility DNR (pink MOST or yellow form);Living will;Healthcare Power of Attorney  Does patient want to make changes to medical advance directive? No - Patient declined  Copy of [place] in Chart? Yes - validated most recent copy scanned in chart (See row information)  Pre-existing out of facility DNR order (yellow form or pink MOST form) Pink MOST form placed in chart (order not valid for inpatient use);Yellow form placed in chart (order not valid for inpatient use)     Chief Complaint  Patient presents with  . Acute Visit    SOB    HPI: Patient is a 87 y.o. female seen today for an acute visit for SOB and Productive Cough  Patient is a long-term care resident Patient has h/o Sinus Bradycardia, LE edema, Hypothyroidism, Depression, Chronic Sinusitis, H/o Hip Fracture,And Proximal Humerus Fracture due to Fall   Enrolled in Hospice Per Hospice Nurse she was c/o Cough with SOB Productive cough Family does not want any aggressive measures No Fever or Chest Pain She said it is hard for her to catch breadth She stays Alert Responsive Stays Mostly in her Bed. Appetite is good Has lost some weight  Past Medical History:  Diagnosis Date  . Asthma   . Carpal tunnel syndrome 03/07/2009  . Disturbance of skin sensation 02/21/2009  . Diverticulosis of colon (without mention of hemorrhage) 02/16/2009  . Fracture of upper end of left humerus 11/18/14  . 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 Hypothyroid   . Osteoarthrosis, unspecified whether generalized or localized, unspecified site 02/16/2009  . Other abnormal blood chemistry 06/19/2010   hyperglycemia  . Other and unspecified hyperlipidemia 10/24/2009  . Other atopic  dermatitis and related conditions 02/16/2009  . Otosclerosis, unspecified 02/22/2003  . Pain in limb 12/05/2009  . Senile osteoporosis 06/16/2010  . Sensorineural hearing loss, unspecified 02/21/2009  . Syncope and collapse 05/30/2009  . Unspecified nasal polyp 02/21/2009  . Unspecified urinary incontinence 02/21/2009    Past Surgical History:  Procedure Laterality Date  . ABDOMINAL HYSTERECTOMY  1999   Dr. Collier Bullock  . BREAST BIOPSY  07/16/1990   benign left breast needle biopsy  Dr. Margot Chimes  . CARPAL TUNNEL RELEASE  04/07/2009   right Dr. Daylene Katayama  . CATARACT EXTRACTION W/ INTRAOCULAR LENS  IMPLANT, BILATERAL Bilateral   . INTRAMEDULLARY (IM) NAIL INTERTROCHANTERIC Right 09/17/2017   Procedure: INTRAMEDULLARY (IM) NAIL INTERTROCHANTRIC HIP;  Surgeon: Nicholes Stairs, MD;  Location: [place];  Service: Orthopedics;  Laterality: Right;  . OPEN REDUCTION INTERNAL FIXATION (ORIF) DISTAL RADIAL FRACTURE Right 09/17/2017   Procedure: OPEN REDUCTION INTERNAL FIXATION (ORIF) DISTAL RADIAL FRACTURE;  Surgeon: Iran Planas, MD;  Location: [place];  Service: Orthopedics;  Laterality: Right;  . TRIGGER FINGER RELEASE Left 08/09/2014   Procedure: RELEASE TRIGGER FINGER/A-1 PULLEY LEFT MIDDLE FINGER;  Surgeon: Daryll Brod, MD;  Location: [place];  Service: Orthopedics;  Laterality: Left;  REGIONAL/FAB    Allergies  Allergen Reactions  . Sulfa Antibiotics Itching, Rash and Other (See Comments)    "Allergic," per MAR  . Amoxicillin Other (See Comments)    Per [facility] 09/15/17 Has patient had a PCN reaction causing immediate rash, facial/tongue/throat swelling, SOB or lightheadedness with hypotension: Unknown  Has patient had a PCN reaction causing severe rash involving mucus membranes or skin necrosis: Unknown Has patient had a PCN reaction that required hospitalization: Unknown Has patient had a PCN reaction occurring within the last 10 years: Unknown If all of the above answers are "NO", then may  proceed with Cephalosporin use.  . Avelox [Moxifloxacin Hcl In Nacl] Itching  . Doxycycline Other (See Comments)    "Allergic," per MAR  . Hista-Tabs [Triprolidine-Pse] Other (See Comments)    "Passed out"  . Pyrilamine-Phenylephrine Other (See Comments)    "Allergic," per [facility] 09/15/17  . Septra [Sulfamethoxazole-Trimethoprim] Itching, Rash and Other (See Comments)    "Allergic," per [facility]    Outpatient Encounter Medications as of 04/05/2020  Medication Sig  . acetaminophen (TYLENOL) 325 MG tablet Take 650 mg by mouth every 6 (six) hours as needed (for pain).   Marland Kitchen albuterol (VENTOLIN HFA) 108 (90 Base) MCG/ACT inhaler Inhale 2 puffs into the lungs every 6 (six) hours as needed for shortness of breath (dyspnea).   . benzonatate (TESSALON) 100 MG capsule Take by mouth 3 (three) times daily as needed for cough.  . chlorhexidine (PERIDEX) 0.12 % solution Use as directed 15 mLs in the mouth or throat daily.  Marland Kitchen escitalopram (LEXAPRO) 20 MG tablet Take 20 mg by mouth daily. In the morning 7am-11am.  . famotidine (PEPCID) 20 MG tablet Take 20 mg by mouth daily. In the morning 7am-11am.  . Fluticasone-Salmeterol (ADVAIR) 250-50 MCG/DOSE AEPB Inhale 1 puff into the lungs 2 (two) times daily. Morning: 7am-11am Evening: 4pm-7pm.  Rinse mouth after use  . lactose free nutrition (BOOST PLUS) LIQD Take 237 mLs by mouth in the morning, at noon, and at bedtime. Add 237 ml's to ice cream and consume two times a day. 10am & 4pm.  . levothyroxine (SYNTHROID, LEVOTHROID) 25 MCG tablet Take 25 mcg by mouth daily before breakfast. 7:30am.  . mirtazapine (REMERON) 15 MG tablet Take 15 mg by mouth daily. 7:30pm-11pm.  . OXYGEN Inhale 2 L/min into the lungs as needed (to keep O2 SATS >90%).   . polyethylene glycol (MIRALAX / GLYCOLAX) packet Take 17 g by mouth daily. Mix with 6-8 ounces of fluid of choice. Once daily 7am-11:00am.  . polyvinyl alcohol (LIQUIFILM TEARS) 1.4 % ophthalmic solution Place 1 drop into both  eyes 3 (three) times daily.  . sodium chloride (OCEAN) 0.65 % nasal spray Place 1 spray into the nose 2 (two) times daily as needed for congestion.  . sodium fluoride (FLUORISHIELD) 1.1 % GEL dental gel Place 1 application onto teeth at bedtime. brush on teeth with toothbrush after PM MOUTHCARE . SPIT OUT EXCESS AND DO NOT RINSE  . zinc oxide 20 % ointment Apply to buttocks as needed every shift after incontinent episodes   No facility-administered encounter medications on file as of 04/05/2020.    Review of Systems:  Review of Systems  Constitutional: Negative.   HENT: Positive for congestion.   Respiratory: Positive for cough and shortness of breath.   Cardiovascular: Negative for leg swelling.  Gastrointestinal: Negative.   Genitourinary: Negative.   Musculoskeletal: Positive for gait problem.  Skin: Negative.   Neurological: Positive for weakness.  Psychiatric/Behavioral: Positive for confusion.    Health Maintenance  Topic Date Due  . TETANUS/TDAP  06/04/2018  . INFLUENZA VACCINE  Completed  . DEXA SCAN  Completed  . COVID-19 Vaccine  Completed  . PNA vac Low Risk Adult  Completed  . HPV VACCINES  Aged Out  Physical Exam: Vitals:   04/05/20 1116  BP: 133/65  Pulse: (!) 52  Resp: 19  Temp: (!) 97.5 F (36.4 C)  SpO2: 94%  Weight: 115 lb 6.4 oz (52.3 kg)  Height: 4\' 8"  (1.422 m)   Body mass index is 25.87 kg/m. Physical Exam Vitals reviewed.  Constitutional:      Appearance: Normal appearance.  HENT:     Head: Normocephalic.     Nose: Nose normal.     Mouth/Throat:     Mouth: Mucous membranes are moist.     Pharynx: Oropharynx is clear.  Eyes:     Pupils: Pupils are equal, round, and reactive to light.  Cardiovascular:     Rate and Rhythm: Normal rate and regular rhythm.     Pulses: Normal pulses.  Pulmonary:     Effort: Pulmonary effort is normal.     Breath sounds: Normal breath sounds. No wheezing.  Abdominal:     General: Abdomen is flat. Bowel  sounds are normal.     Palpations: Abdomen is soft.  Musculoskeletal:        General: No swelling.     Cervical back: Neck supple.  Skin:    General: Skin is warm.  Neurological:     General: No focal deficit present.     Mental Status: She is alert.  Psychiatric:        Mood and Affect: Mood normal.        Thought Content: Thought content normal.     Labs reviewed: Basic Metabolic Panel: Recent Labs    05/10/19 0000 06/17/19 0000 09/28/19 0000  NA  --  143 143  K  --  4.0 4.2  CL  --  110* 110*  CO2  --  26* 22  BUN  --  27* 12  CREATININE  --  1.0 0.9  CALCIUM  --  9.3 8.8  TSH 0.82  --  1.03   Liver Function Tests: Recent Labs    06/17/19 0000 09/28/19 0000  AST 14 16  ALT 11 13  ALKPHOS 45 54  ALBUMIN 3.6 3.2*   No results for input(s): LIPASE, AMYLASE in the last 8760 hours. No results for input(s): AMMONIA in the last 8760 hours. CBC: Recent Labs    06/17/19 0000 09/28/19 0000  WBC 5.4 6.9  NEUTROABS 2,117 3,064  HGB 12.9 12.9  HCT 39 39  PLT 224 195   Lipid Panel: No results for input(s): CHOL, HDL, LDLCALC, TRIG, CHOLHDL, LDLDIRECT in the last 8760 hours. Lab Results  Component Value Date   HGBA1C 6.0 06/17/2019    Procedures since last visit: No results found.  Assessment/Plan 1. SOB (shortness of breath) Duo Nebs BID for 5 days Per family no antibiotics or any other aggressive treatment Will also start on Low dose of Roxanol 2.5 mg as needed for SOB 2. Dry cough On Tessalon Pear[place]  3. Simple chronic bronchitis (HCC) on Advair Will also do Duo Nebs for few days  4. Hypothyroidism, unspecified type TSH normal 4/21 Would not repeat Labs  5. Depression, recurrent ([place]) Staying stable on Lexapro and Remeron NO changes for now Family wants her comfortable Under Hospice    Labs/tests ordered:  * No order type specified * Next appt:  Visit date not found

## 2020-04-07 MED ORDER — MORPHINE SULFATE (CONCENTRATE) 20 MG/ML PO SOLN: 2.5000 mg | Freq: Two times a day (BID) | ORAL | 0 refills | Status: DC | PRN

## 2020-04-12 ENCOUNTER — Encounter: Payer: Self-pay | Admitting: Nurse Practitioner

## 2020-04-12 ENCOUNTER — Non-Acute Institutional Stay (SKILLED_NURSING_FACILITY): Payer: Medicare Other | Admitting: Nurse Practitioner

## 2020-04-12 DIAGNOSIS — J410 Simple chronic bronchitis: Secondary | ICD-10-CM | POA: Diagnosis not present

## 2020-04-12 DIAGNOSIS — K219 Gastro-esophageal reflux disease without esophagitis: Secondary | ICD-10-CM

## 2020-04-12 DIAGNOSIS — F339 Major depressive disorder, recurrent, unspecified: Secondary | ICD-10-CM

## 2020-04-12 DIAGNOSIS — E039 Hypothyroidism, unspecified: Secondary | ICD-10-CM

## 2020-04-12 NOTE — Assessment & Plan Note (Signed)
stable, on Levothyroxine 31mcg qd.TSH 1.03 09/28/19

## 2020-04-12 NOTE — Progress Notes (Signed)
Location:    [facility] Room Number: 25 Place of Service:  SNF (31) Provider: Marlana Latus NP  Alexsandria Kivett X, NP  Patient Care Team: Riccardo Holeman X, NP as PCP - General (Internal Medicine) Newton Pigg, MD as Consulting Physician (Obstetrics and Gynecology) Monna Fam, MD as Consulting Physician (Ophthalmology) Melida Quitter, MD as Consulting Physician (Otolaryngology) Myrlene Broker, MD as Consulting Physician (Urology) [place], [facility] Virgie Dad, MD as Consulting Physician (Internal Medicine)  Extended Emergency Contact Information Primary Emergency Contact: Ryland,[name] Address: [place], [place] Phone: [phone] Work Phone: [phone] Mobile Phone: [phone] Relation: Son Secondary Emergency Contact: Teofilo [place] Phone: [phone] Work Phone: [phone] Relation: Friend  Code Status:  DNR Goals of care: Advanced Directive information Advanced Directives 02/16/2020  Does Patient Have a Medical Advance Directive? Yes  Type of Advance Directive Out of facility DNR (pink MOST or yellow form);Living will;Healthcare Power of Attorney  Does patient want to make changes to medical advance directive? No - Patient declined  Copy of [place] in Chart? Yes - validated most recent copy scanned in chart (See row information)  Pre-existing out of facility DNR order (yellow form or pink MOST form) Pink MOST form placed in chart (order not valid for inpatient use);Yellow form placed in chart (order not valid for inpatient use)     Chief Complaint  Patient presents with  . Medical Management of Chronic Issues  . Health Maintenance    TDAP    HPI:  Pt is a 87 y.o. female seen today for medical management of chronic diseases.      Persisted gradual weight loss, dietary supplement, already taking Mirtazapine.   Dementia,her goal of care is comfort measures, MOST no IVF/ABT.  Hx of constipation, stable, on MiraLax qd.  Her mood is stable, on Mirtazapine 15mg  qd, Escitalopram 20mg  qd.  Hypothyroidism, stable, on Levothyroxine 42mcg qd.TSH 1.03 09/28/19 COPD, stable,takesprn Albuterol HFA, prn Tessalon, Advair bid, saline nasal spray.No ABT. Prn Morphine is available to her.  GERD, stable, on Famotidine 20mg  qd.  Past Medical History:  Diagnosis Date  . Asthma   . Carpal tunnel syndrome 03/07/2009  . Disturbance of skin sensation 02/21/2009  . Diverticulosis of colon (without mention of hemorrhage) 02/16/2009  . Fracture of upper end of left humerus 11/18/14  . 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 Hypothyroid   . Osteoarthrosis, unspecified whether generalized or localized, unspecified site 02/16/2009  . Other abnormal blood chemistry 06/19/2010   hyperglycemia  . Other and unspecified hyperlipidemia 10/24/2009  . Other atopic dermatitis and related conditions 02/16/2009  . Otosclerosis, unspecified 02/22/2003  . Pain in limb 12/05/2009  . Senile osteoporosis 06/16/2010  . Sensorineural hearing loss, unspecified 02/21/2009  . Syncope and collapse 05/30/2009  . Unspecified nasal polyp 02/21/2009  . Unspecified urinary incontinence 02/21/2009   Past Surgical History:  Procedure Laterality Date  . ABDOMINAL HYSTERECTOMY  1999   Dr. Collier Bullock  . BREAST BIOPSY  07/16/1990   benign left breast needle biopsy  Dr. Margot Chimes  . CARPAL TUNNEL RELEASE  04/07/2009   right Dr. Daylene Katayama  . CATARACT EXTRACTION W/ INTRAOCULAR LENS  IMPLANT, BILATERAL Bilateral   . INTRAMEDULLARY (IM) NAIL INTERTROCHANTERIC Right 09/17/2017   Procedure: INTRAMEDULLARY (IM) NAIL INTERTROCHANTRIC HIP;  Surgeon: Nicholes Stairs, MD;  Location: [facility];  Service: Orthopedics;  Laterality: Right;  . OPEN REDUCTION INTERNAL FIXATION (ORIF) DISTAL RADIAL  FRACTURE Right 09/17/2017   Procedure: OPEN REDUCTION INTERNAL FIXATION (ORIF) DISTAL RADIAL FRACTURE;  Surgeon: Iran Planas, MD;  Location: [facility];  Service: Orthopedics;  Laterality: Right;  . TRIGGER FINGER RELEASE Left 08/09/2014   Procedure: RELEASE TRIGGER FINGER/A-1 PULLEY LEFT MIDDLE FINGER;  Surgeon: Daryll Brod, MD;  Location: [facility];  Service: Orthopedics;  Laterality: Left;  REGIONAL/FAB    Allergies  Allergen Reactions  . Sulfa Antibiotics Itching, Rash and Other (See Comments)    "Allergic," per MAR  . Amoxicillin Other (See Comments)    Per [facility] 09/15/17 Has patient had a PCN reaction causing immediate rash, facial/tongue/throat swelling, SOB or lightheadedness with hypotension: Unknown Has patient had a PCN reaction causing severe rash involving mucus membranes or skin necrosis: Unknown Has patient had a PCN reaction that required hospitalization: Unknown Has patient had a PCN reaction occurring within the last 10 years: Unknown If all of the above answers are "NO", then may proceed with Cephalosporin use.  . Avelox [Moxifloxacin Hcl In Nacl] Itching  . Doxycycline Other (See Comments)    "Allergic," per MAR  . Hista-Tabs [Triprolidine-Pse] Other (See Comments)    "Passed out"  . Pyrilamine-Phenylephrine Other (See Comments)    "Allergic," per [facility] 09/15/17  . Septra [Sulfamethoxazole-Trimethoprim] Itching, Rash and Other (See Comments)    "Allergic," per MAR    Allergies as of 04/12/2020      Reactions   Sulfa Antibiotics Itching, Rash, Other (See Comments)   "Allergic," per MAR   Amoxicillin Other (See Comments)   Per [facility] 09/15/17 Has patient had a PCN reaction causing immediate rash, facial/tongue/throat swelling, SOB or lightheadedness with hypotension: Unknown Has patient had a PCN reaction causing severe rash involving mucus membranes or skin necrosis: Unknown Has patient had a PCN reaction that required hospitalization: Unknown Has patient had a  PCN reaction occurring within the last 10 years: Unknown If all of the above answers are "NO", then may proceed with Cephalosporin use.   Avelox [moxifloxacin Hcl In Nacl] Itching   Doxycycline Other (See Comments)   "Allergic," per MAR   Hista-tabs [triprolidine-pse] Other (See Comments)   "Passed out"   Pyrilamine-phenylephrine Other (See Comments)   "Allergic," per [facility] 09/15/17   Septra [sulfamethoxazole-trimethoprim] Itching, Rash, Other (See Comments)   "Allergic," per [facility]      Medication List       Accurate as of April 12, 2020 11:59 PM. If you have any questions, ask your nurse or doctor.        acetaminophen 325 MG tablet Commonly known as: TYLENOL Take 650 mg by mouth every 6 (six) hours as needed (for pain).   albuterol 108 (90 Base) MCG/ACT inhaler Commonly known as: VENTOLIN HFA Inhale 2 puffs into the lungs every 6 (six) hours as needed for shortness of breath (dyspnea).   benzonatate 100 MG capsule Commonly known as: TESSALON Take by mouth 3 (three) times daily as needed for cough.   chlorhexidine 0.12 % solution Commonly known as: PERIDEX Use as directed 15 mLs in the mouth or throat daily.   escitalopram 20 MG tablet Commonly known as: LEXAPRO Take 20 mg by mouth daily. In the morning 7am-11am.   famotidine 20 MG tablet Commonly known as: PEPCID Take 20 mg by mouth daily. In the morning 7am-11am.   Fluticasone-Salmeterol 250-50 MCG/DOSE Aepb Commonly known as: ADVAIR Inhale 1 puff into the lungs 2 (two) times daily. Morning:  7am-11am Evening: 4pm-7pm.  Rinse mouth after use   lactose free nutrition Liqd Take 237 mLs by mouth in the morning, at noon, and at bedtime. Add 237 ml's to ice cream and consume two times a day. 10am & 4pm.   levothyroxine 25 MCG tablet Commonly known as: SYNTHROID Take 25 mcg by mouth daily before breakfast. 7:30am.   mirtazapine 15 MG tablet Commonly known as: REMERON Take 15 mg by mouth daily. 7:30pm-11pm.    morphine CONCENTRATE 10 mg / 0.5 ml concentrated solution Take 2.5 mg by mouth every 6 (six) hours as needed for severe pain. What changed: Another medication with the same name was removed. Continue taking this medication, and follow the directions you see here. Changed by: India Jolin X Cam Dauphin, NP   OXYGEN Inhale 2 L/min into the lungs as needed (to keep O2 SATS >90%).   polyethylene glycol 17 g packet Commonly known as: MIRALAX / GLYCOLAX Take 17 g by mouth daily. Mix with 6-8 ounces of fluid of choice. Once daily 7am-11:00am.   polyvinyl alcohol 1.4 % ophthalmic solution Commonly known as: LIQUIFILM TEARS Place 1 drop into both eyes 3 (three) times daily.   sodium chloride 0.65 % nasal spray Commonly known as: OCEAN Place 1 spray into the nose 2 (two) times daily as needed for congestion.   sodium fluoride 1.1 % Gel dental gel Commonly known as: FLUORISHIELD Place 1 application onto teeth at bedtime. brush on teeth with toothbrush after PM MOUTHCARE . SPIT OUT EXCESS AND DO NOT RINSE   zinc oxide 20 % ointment Apply to buttocks as needed every shift after incontinent episodes       Review of Systems  Constitutional: Positive for unexpected weight change. Negative for fatigue and fever.       #7Ibs weight loss in the past month.   HENT: Positive for congestion and hearing loss. Negative for voice change.   Eyes: Negative for visual disturbance.  Respiratory: Positive for cough and shortness of breath. Negative for wheezing.        DOE  Cardiovascular: Negative for leg swelling.  Gastrointestinal: Negative for abdominal pain and constipation.  Genitourinary: Negative for difficulty urinating, dysuria and urgency.  Musculoskeletal: Positive for arthralgias and gait problem.  Skin: Negative for color change.  Neurological: Negative for syncope, weakness and headaches.       Memory lapses.  Psychiatric/Behavioral: Negative for behavioral problems and sleep disturbance. The patient  is not nervous/anxious.     Immunization History  Administered Date(s) Administered  . Influenza Whole 12/27/2011, 11/05/2012  . Influenza, High Dose Seasonal PF 11/19/2018  . Influenza-Unspecified 11/18/2013, 11/03/2014, 11/16/2015, 11/13/2016, 11/17/2017, 11/09/2019  . Moderna Sars-Covid-2 Vaccination 02/08/2019, 03/08/2019, 12/20/2019  . PPD Test 04/04/2008, 11/23/2014  . Pneumococcal Conjugate-13 09/30/2016  . Pneumococcal Polysaccharide-23 02/05/2008, 06/03/2008  . Td 02/05/2008, 06/03/2008   Pertinent  Health Maintenance Due  Topic Date Due  . INFLUENZA VACCINE  Completed  . DEXA SCAN  Completed  . PNA vac Low Risk Adult  Completed   Fall Risk  10/09/2017 09/23/2016 08/27/2016 05/30/2015 05/16/2015  Falls in the past year? Yes Yes Yes Yes No  Comment - - Emmi Telephone Survey: data to providers prior to load - -  Number falls in past yr: 2 or more 1 1 1  -  Comment - - Emmi Telephone Survey Actual Response = 1 - -  Injury with Fall? Yes No No Yes -  Comment broke R wrist - - - -  Risk Factor Category  - - - - -  Risk for fall due to : - - - History of fall(s);Impaired balance/gait;Impaired mobility -  Follow up - - - Falls evaluation completed;Education provided -   Functional Status Survey:    Vitals:   04/12/20 1435  BP: (!) 107/58  Pulse: (!) 58  Resp: 18  Temp: (!) 97 F (36.1 C)  SpO2: 96%  Weight: 114 lb 12.8 oz (52.1 kg)  Height: 4\' 8"  (1.422 m)   Body mass index is 25.74 kg/m. Physical Exam Vitals and nursing note reviewed.  Constitutional:      Appearance: Normal appearance.  HENT:     Head: Normocephalic and atraumatic.     Mouth/Throat:     Mouth: Mucous membranes are moist.  Eyes:     Extraocular Movements: Extraocular movements intact.     Conjunctiva/sclera: Conjunctivae normal.     Pupils: Pupils are equal, round, and reactive to light.  Cardiovascular:     Rate and Rhythm: Normal rate and regular rhythm.     Heart sounds: No murmur  heard.   Pulmonary:     Breath sounds: No rales.  Abdominal:     General: Bowel sounds are normal.     Palpations: Abdomen is soft.     Tenderness: There is no abdominal tenderness.  Musculoskeletal:     Cervical back: Normal range of motion and neck supple.     Right lower leg: No edema.     Left lower leg: No edema.     Comments: R shoulder limited over head ROM  Skin:    General: Skin is warm and dry.  Neurological:     General: No focal deficit present.     Mental Status: She is alert. Mental status is at baseline.     Gait: Gait abnormal.     Comments: Oriented to person  Psychiatric:        Mood and Affect: Mood normal.        Behavior: Behavior normal.        Thought Content: Thought content normal.     Comments: Followed simple directions, smiled during today's examination     Labs reviewed: Recent Labs    06/17/19 0000 09/28/19 0000  NA 143 143  K 4.0 4.2  CL 110* 110*  CO2 26* 22  BUN 27* 12  CREATININE 1.0 0.9  CALCIUM 9.3 8.8   Recent Labs    06/17/19 0000 09/28/19 0000  AST 14 16  ALT 11 13  ALKPHOS 45 54  ALBUMIN 3.6 3.2*   Recent Labs    06/17/19 0000 09/28/19 0000  WBC 5.4 6.9  NEUTROABS 2,117 3,064  HGB 12.9 12.9  HCT 39 39  PLT 224 195   Lab Results  Component Value Date   TSH 1.03 09/28/2019   Lab Results  Component Value Date   HGBA1C 6.0 06/17/2019   Lab Results  Component Value Date   CHOL 211 (A) 02/14/2014   HDL 47 02/14/2014   LDLCALC 133 02/14/2014   TRIG 155 02/14/2014    Significant Diagnostic Results in last 30 days:  No results found.  Assessment/Plan GERD (gastroesophageal reflux disease) stable, on Famotidine 20mg  qd.  COPD (chronic obstructive pulmonary disease) (HCC) stable,takesprn Albuterol HFA, prn Tessalon, Advair bid, saline nasal spray.No ABT. Prn Morphine is available to her.    Hypothyroid stable, on Levothyroxine 104mcg qd.TSH 1.03 09/28/19   Depression, recurrent ([place]) Her mood  is stable, on Mirtazapine 15mg  qd, Escitalopram 20mg  qd.    Family/ staff Communication:  plan of care reviewed with the patient and charge nurse.   Labs/tests ordered: none  Time spend 25 minutes.

## 2020-04-12 NOTE — Assessment & Plan Note (Signed)
stable, on Famotidine 20mg qd. 

## 2020-04-12 NOTE — Assessment & Plan Note (Signed)
stable,takesprn Albuterol HFA, prn Tessalon, Advair bid, saline nasal spray.No ABT. Prn Morphine is available to her.

## 2020-04-12 NOTE — Assessment & Plan Note (Signed)
Her mood is stable, on Mirtazapine 15mg  qd, Escitalopram 20mg  qd.

## 2020-04-14 ENCOUNTER — Encounter: Payer: Self-pay | Admitting: Nurse Practitioner

## 2020-05-12 ENCOUNTER — Non-Acute Institutional Stay (SKILLED_NURSING_FACILITY): Payer: Medicare Other | Admitting: Orthopedic Surgery

## 2020-05-12 ENCOUNTER — Encounter: Payer: Self-pay | Admitting: Orthopedic Surgery

## 2020-05-12 DIAGNOSIS — F339 Major depressive disorder, recurrent, unspecified: Secondary | ICD-10-CM

## 2020-05-12 DIAGNOSIS — K5900 Constipation, unspecified: Secondary | ICD-10-CM | POA: Diagnosis not present

## 2020-05-12 DIAGNOSIS — R627 Adult failure to thrive: Secondary | ICD-10-CM | POA: Diagnosis not present

## 2020-05-12 DIAGNOSIS — J410 Simple chronic bronchitis: Secondary | ICD-10-CM

## 2020-05-12 DIAGNOSIS — K219 Gastro-esophageal reflux disease without esophagitis: Secondary | ICD-10-CM

## 2020-05-12 DIAGNOSIS — E039 Hypothyroidism, unspecified: Secondary | ICD-10-CM | POA: Diagnosis not present

## 2020-05-12 NOTE — Progress Notes (Signed)
Location:  [place] Room Number: 25 Place of Service:  SNF (31) Provider:  Windell Moulding, AGNP-C  Mast, Man X, NP  Patient Care Team: Mast, Man X, NP as PCP - General (Internal Medicine) Newton Pigg, MD as Consulting Physician (Obstetrics and Gynecology) Monna Fam, MD as Consulting Physician (Ophthalmology) Melida Quitter, MD as Consulting Physician (Otolaryngology) Myrlene Broker, MD as Consulting Physician (Urology) [place], [facility] Virgie Dad, MD as Consulting Physician (Internal Medicine)  Extended Emergency Contact Information Primary Emergency Contact: Keahey,[name] Address: [place], [place] Phone: [phone] Work Phone: [phone] Mobile Phone: [phone] Relation: Son Secondary Emergency Contact: Teofilo Pod [place] Phone: [phone] Work Phone: [phone] Relation: Friend  Goals of care: Advanced Directive information Advanced Directives 02/16/2020  Does Patient Have a Medical Advance Directive? Yes  Type of Advance Directive Out of facility DNR (pink MOST or yellow form);Living will;Healthcare Power of Attorney  Does patient want to make changes to medical advance directive? No - Patient declined  Copy of [place] in Chart? Yes - validated most recent copy scanned in chart (See row information)  Pre-existing out of facility DNR order (yellow form or pink MOST form) Pink MOST form placed in chart (order not valid for inpatient use);Yellow form placed in chart (order not valid for inpatient use)     Chief Complaint  Patient presents with  . Medical Management of Chronic Issues    Routine follow up visit. Discuss need for Tetanus/Tdap vaccine    HPI:  Pt is a 87 y.o. female seen today for medical management of chronic diseases.    She continues to be on Hospice service. BIMS 03/01, score of 4. Today, she is  laying in bed, easily aroused. Oriented to self and person, follows commands. Continues to have difficulty finishing sentences, appears fatigued. At the end of our encounter she states" please let me rest."   Eating < 25% of breakfast. Averaging about 50-75% lunch and dinner. Also receives nutritional supplement and Remeron daily   Recent weights are as follows:  01/01- 122.1 lbs  02/01- 115.4 lbs  03/04- 114.8 lbs  04/05- 116.3 lbs  No recent falls, injuries or behavioral outbursts.   Past Medical History:  Diagnosis Date  . Asthma   . Carpal tunnel syndrome 03/07/2009  . Disturbance of skin sensation 02/21/2009  . Diverticulosis of colon (without mention of hemorrhage) 02/16/2009  . Fracture of upper end of left humerus 11/18/14  . 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 Hypothyroid   . Osteoarthrosis, unspecified whether generalized or localized, unspecified site 02/16/2009  . Other abnormal blood chemistry 06/19/2010   hyperglycemia  . Other and unspecified hyperlipidemia 10/24/2009  . Other atopic dermatitis and related conditions 02/16/2009  . Otosclerosis, unspecified 02/22/2003  . Pain in limb 12/05/2009  . Senile osteoporosis 06/16/2010  . Sensorineural hearing loss, unspecified 02/21/2009  . Syncope and collapse 05/30/2009  . Unspecified nasal polyp 02/21/2009  . Unspecified urinary incontinence 02/21/2009   Past Surgical History:  Procedure Laterality Date  . ABDOMINAL HYSTERECTOMY  1999   Dr. Collier Bullock  . BREAST BIOPSY  07/16/1990   benign left breast needle biopsy  Dr. Margot Chimes  . CARPAL TUNNEL RELEASE  04/07/2009   right Dr. Daylene Katayama  . CATARACT EXTRACTION W/ INTRAOCULAR LENS  IMPLANT, BILATERAL Bilateral   . INTRAMEDULLARY (IM) NAIL INTERTROCHANTERIC Right  09/17/2017   Procedure: INTRAMEDULLARY (IM) NAIL INTERTROCHANTRIC HIP;  Surgeon: Nicholes Stairs, MD;  Location: [place];  Service: Orthopedics;  Laterality: Right;  . OPEN REDUCTION INTERNAL FIXATION (ORIF) DISTAL RADIAL  FRACTURE Right 09/17/2017   Procedure: OPEN REDUCTION INTERNAL FIXATION (ORIF) DISTAL RADIAL FRACTURE;  Surgeon: Iran Planas, MD;  Location: [place];  Service: Orthopedics;  Laterality: Right;  . TRIGGER FINGER RELEASE Left 08/09/2014   Procedure: RELEASE TRIGGER FINGER/A-1 PULLEY LEFT MIDDLE FINGER;  Surgeon: Daryll Brod, MD;  Location: [place];  Service: Orthopedics;  Laterality: Left;  REGIONAL/FAB    Allergies  Allergen Reactions  . Sulfa Antibiotics Itching, Rash and Other (See Comments)    "Allergic," per MAR  . Amoxicillin Other (See Comments)    Per [facility] 09/15/17 Has patient had a PCN reaction causing immediate rash, facial/tongue/throat swelling, SOB or lightheadedness with hypotension: Unknown Has patient had a PCN reaction causing severe rash involving mucus membranes or skin necrosis: Unknown Has patient had a PCN reaction that required hospitalization: Unknown Has patient had a PCN reaction occurring within the last 10 years: Unknown If all of the above answers are "NO", then may proceed with Cephalosporin use.  . Avelox [Moxifloxacin Hcl In Nacl] Itching  . Doxycycline Other (See Comments)    "Allergic," per MAR  . Hista-Tabs [Triprolidine-Pse] Other (See Comments)    "Passed out"  . Pyrilamine-Phenylephrine Other (See Comments)    "Allergic," per [facility] 09/15/17  . Septra [Sulfamethoxazole-Trimethoprim] Itching, Rash and Other (See Comments)    "Allergic," per [facility]    Outpatient Encounter Medications as of 05/12/2020  Medication Sig  . acetaminophen (TYLENOL) 325 MG tablet Take 650 mg by mouth every 6 (six) hours as needed (for pain).   Marland Kitchen albuterol (VENTOLIN HFA) 108 (90 Base) MCG/ACT inhaler Inhale 2 puffs into the lungs every 6 (six) hours as needed for shortness of breath (dyspnea).   . benzonatate (TESSALON) 100 MG capsule Take by mouth 3 (three) times daily as needed for cough.  . chlorhexidine (PERIDEX) 0.12 % solution Use as directed 15 mLs in the mouth or  throat daily.  . famotidine (PEPCID) 20 MG tablet Take 20 mg by mouth daily. In the morning 7am-11am.  . Fluticasone-Salmeterol (ADVAIR) 250-50 MCG/DOSE AEPB Inhale 1 puff into the lungs 2 (two) times daily. Morning: 7am-11am Evening: 4pm-7pm.  Rinse mouth after use  . lactose free nutrition (BOOST PLUS) LIQD Take 237 mLs by mouth in the morning, at noon, and at bedtime. Add 237 ml's to ice cream and consume two times a day. 10am & 4pm.  . levothyroxine (SYNTHROID, LEVOTHROID) 25 MCG tablet Take 25 mcg by mouth daily before breakfast. 7:30am.  . mirtazapine (REMERON) 15 MG tablet Take 15 mg by mouth daily. 7:30pm-11pm.  . Morphine Sulfate (MORPHINE CONCENTRATE) 10 mg / 0.5 ml concentrated solution Take 2.5 mg by mouth every 6 (six) hours as needed for severe pain.  . OXYGEN Inhale 2 L/min into the lungs as needed (to keep O2 SATS >90%).   . polyethylene glycol (MIRALAX / GLYCOLAX) packet Take 17 g by mouth daily. Mix with 6-8 ounces of fluid of choice. Once daily 7am-11:00am.  . polyvinyl alcohol (LIQUIFILM TEARS) 1.4 % ophthalmic solution Place 1 drop into both eyes 3 (three) times daily.  . sodium chloride (OCEAN) 0.65 % nasal spray Place 1 spray into the nose 2 (two) times daily as needed for congestion.  . sodium fluoride (FLUORISHIELD) 1.1 % GEL dental gel Place 1 application onto  teeth at bedtime. brush on teeth with toothbrush after PM MOUTHCARE . SPIT OUT EXCESS AND DO NOT RINSE  . zinc oxide 20 % ointment Apply to buttocks as needed every shift after incontinent episodes  . escitalopram (LEXAPRO) 20 MG tablet Take 20 mg by mouth daily. In the morning 7am-11am.   No facility-administered encounter medications on file as of 05/12/2020.    Review of Systems  Unable to perform ROS: Dementia    Immunization History  Administered Date(s) Administered  . Influenza Whole 12/27/2011, 11/05/2012  . Influenza, High Dose Seasonal PF 11/19/2018  . Influenza-Unspecified 11/18/2013, 11/03/2014,  11/16/2015, 11/13/2016, 11/17/2017, 11/09/2019  . Moderna Sars-Covid-2 Vaccination 02/08/2019, 03/08/2019, 12/20/2019  . PPD Test 04/04/2008, 11/23/2014  . Pneumococcal Conjugate-13 09/30/2016  . Pneumococcal Polysaccharide-23 02/05/2008, 06/03/2008  . Td 02/05/2008, 06/03/2008   Pertinent  Health Maintenance Due  Topic Date Due  . INFLUENZA VACCINE  09/04/2020  . DEXA SCAN  Completed  . PNA vac Low Risk Adult  Completed   Fall Risk  10/09/2017 09/23/2016 08/27/2016 05/30/2015 05/16/2015  Falls in the past year? Yes Yes Yes Yes No  Comment - - Emmi Telephone Survey: data to providers prior to load - -  Number falls in past yr: 2 or more 1 1 1  -  Comment - - Emmi Telephone Survey Actual Response = 1 - -  Injury with Fall? Yes No No Yes -  Comment broke R wrist - - - -  Risk Factor Category  - - - - -  Risk for fall due to : - - - History of fall(s);Impaired balance/gait;Impaired mobility -  Follow up - - - Falls evaluation completed;Education provided -   Functional Status Survey:    Vitals:   05/12/20 1404  BP: (!) 143/67  Pulse: (!) 57  Resp: 20  Temp: 97.6 F (36.4 C)  SpO2: 94%  Weight: 116 lb 4.8 oz (52.8 kg)  Height: 4\' 8"  (1.422 m)   Body mass index is 26.07 kg/m. Physical Exam Vitals reviewed.  HENT:     Head: Normocephalic.  Eyes:     General:        Right eye: No discharge.        Left eye: No discharge.  Neck:     Vascular: No carotid bruit.  Cardiovascular:     Rate and Rhythm: Regular rhythm. Bradycardia present.     Pulses: Normal pulses.     Heart sounds: Normal heart sounds. No murmur heard.   Pulmonary:     Effort: Pulmonary effort is normal. No respiratory distress.     Breath sounds: Normal breath sounds. No wheezing.  Abdominal:     General: Bowel sounds are normal. There is no distension.     Palpations: Abdomen is soft.     Tenderness: There is no abdominal tenderness.  Musculoskeletal:     Cervical back: Normal range of motion.      Right lower leg: No edema.     Left lower leg: No edema.  Lymphadenopathy:     Cervical: No cervical adenopathy.  Skin:    General: Skin is warm and dry.     Capillary Refill: Capillary refill takes less than 2 seconds.  Neurological:     General: No focal deficit present.     Mental Status: She is easily aroused. Mental status is at baseline.     Motor: Weakness present.     Gait: Gait abnormal.  Psychiatric:        Mood  and Affect: Mood normal. Affect is flat.        Behavior: Behavior normal.        Cognition and Memory: Memory is impaired.     Labs reviewed: Recent Labs    06/17/19 0000 09/28/19 0000  NA 143 143  K 4.0 4.2  CL 110* 110*  CO2 26* 22  BUN 27* 12  CREATININE 1.0 0.9  CALCIUM 9.3 8.8   Recent Labs    06/17/19 0000 09/28/19 0000  AST 14 16  ALT 11 13  ALKPHOS 45 54  ALBUMIN 3.6 3.2*   Recent Labs    06/17/19 0000 09/28/19 0000  WBC 5.4 6.9  NEUTROABS 2,117 3,064  HGB 12.9 12.9  HCT 39 39  PLT 224 195   Lab Results  Component Value Date   TSH 1.03 09/28/2019   Lab Results  Component Value Date   HGBA1C 6.0 06/17/2019   Lab Results  Component Value Date   CHOL 211 (A) 02/14/2014   HDL 47 02/14/2014   LDLCALC 133 02/14/2014   TRIG 155 02/14/2014    Significant Diagnostic Results in last 30 days:  No results found.  Assessment/Plan 1. Adult failure to thrive - followed by Hospice - appetite varies, lost about 6 lbs since January 2022 - cont Boost and Remeron regimen  2. Gastroesophageal reflux disease, unspecified whether esophagitis present - stable with daily Pepcid  3. Hypothyroidism, unspecified type - TSH 1.03- 09/2019 - cont levothyroxine 25 mcg po QAM  4. Constipation, unspecified constipation type - last BM today - abdomen soft, active bowel sounds - cont daily miralax  5. Depression, recurrent ([place]) - flat affect, appetite fair - cont Remeron and Lexapro  6. Simple Chronic Bronchitis - due to COPD -  oxygen prn - cont prn albuterol, tessalon, and advair regimen   Family/ staff Communication: plan discussed with patient and nurse  Labs/tests ordered: none

## 2020-05-12 NOTE — Progress Notes (Signed)
Location:   [place] Room Number: 25 Place of Service:  SNF (31) Provider:  Windell Moulding, NP  Mast, Man X, NP  Patient Care Team: Mast, Man X, NP as PCP - General (Internal Medicine) Newton Pigg, MD as Consulting Physician (Obstetrics and Gynecology) Monna Fam, MD as Consulting Physician (Ophthalmology) Melida Quitter, MD as Consulting Physician (Otolaryngology) Myrlene Broker, MD as Consulting Physician (Urology) [place], [facility] Virgie Dad, MD as Consulting Physician (Internal Medicine)  Extended Emergency Contact Information Primary Emergency Contact: Brasher,[name] Address: [place], [place] Phone: [phone] Work Phone: [phone] Mobile Phone: [phone] Relation: Son Secondary Emergency Contact: Teofilo Pod [place] of [place] Phone: [phone] Work Phone: [phone] Relation: Friend  Code Status:  DNR Goals of care: Advanced Directive information Advanced Directives 05/12/2020  Does Patient Have a Medical Advance Directive? Yes  Type of Paramedic of Hope;Living will  Does patient want to make changes to medical advance directive? No - Patient declined  Copy of [place] in Chart? Yes - validated most recent copy scanned in chart (See row information)  Pre-existing out of facility DNR order (yellow form or pink MOST form) -     Chief Complaint  Patient presents with  . Medical Management of Chronic Issues    Routine follow up visit. Discuss need for Tetanus/Tdap vaccine    HPI:  Pt is a 87 y.o. female seen today for medical management of chronic diseases.     Past Medical History:  Diagnosis Date  . Asthma   . Carpal tunnel syndrome 03/07/2009  . Disturbance of skin sensation 02/21/2009  . Diverticulosis of colon (without mention of hemorrhage) 02/16/2009  . Fracture of upper end of left  humerus 11/18/14  . 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 Hypothyroid   . Osteoarthrosis, unspecified whether generalized or localized, unspecified site 02/16/2009  . Other abnormal blood chemistry 06/19/2010   hyperglycemia  . Other and unspecified hyperlipidemia 10/24/2009  . Other atopic dermatitis and related conditions 02/16/2009  . Otosclerosis, unspecified 02/22/2003  . Pain in limb 12/05/2009  . Senile osteoporosis 06/16/2010  . Sensorineural hearing loss, unspecified 02/21/2009  . Syncope and collapse 05/30/2009  . Unspecified nasal polyp 02/21/2009  . Unspecified urinary incontinence 02/21/2009   Past Surgical History:  Procedure Laterality Date  . ABDOMINAL HYSTERECTOMY  1999   Dr. Collier Bullock  . BREAST BIOPSY  07/16/1990   benign left breast needle biopsy  Dr. Margot Chimes  . CARPAL TUNNEL RELEASE  04/07/2009   right Dr. Daylene Katayama  . CATARACT EXTRACTION W/ INTRAOCULAR LENS  IMPLANT, BILATERAL Bilateral   . INTRAMEDULLARY (IM) NAIL INTERTROCHANTERIC Right 09/17/2017   Procedure: INTRAMEDULLARY (IM) NAIL INTERTROCHANTRIC HIP;  Surgeon: Nicholes Stairs, MD;  Location: [place];  Service: Orthopedics;  Laterality: Right;  . OPEN REDUCTION INTERNAL FIXATION (ORIF) DISTAL RADIAL FRACTURE Right 09/17/2017   Procedure: OPEN REDUCTION INTERNAL FIXATION (ORIF) DISTAL RADIAL FRACTURE;  Surgeon: Iran Planas, MD;  Location: [place];  Service: Orthopedics;  Laterality: Right;  . TRIGGER FINGER RELEASE Left 08/09/2014   Procedure: RELEASE TRIGGER FINGER/A-1 PULLEY LEFT MIDDLE FINGER;  Surgeon: Daryll Brod, MD;  Location: [place];  Service: Orthopedics;  Laterality: Left;  REGIONAL/FAB    Allergies  Allergen Reactions  . Sulfa Antibiotics Itching, Rash and Other (See Comments)    "Allergic," per MAR  .  Amoxicillin Other (See Comments)    Per [facility] 09/15/17 Has patient had a PCN reaction causing immediate rash, facial/tongue/throat swelling, SOB or lightheadedness with hypotension: Unknown Has  patient had a PCN reaction causing severe rash involving mucus membranes or skin necrosis: Unknown Has patient had a PCN reaction that required hospitalization: Unknown Has patient had a PCN reaction occurring within the last 10 years: Unknown If all of the above answers are "NO", then may proceed with Cephalosporin use.  . Avelox [Moxifloxacin Hcl In Nacl] Itching  . Doxycycline Other (See Comments)    "Allergic," per MAR  . Hista-Tabs [Triprolidine-Pse] Other (See Comments)    "Passed out"  . Pyrilamine-Phenylephrine Other (See Comments)    "Allergic," per [facility] 09/15/17  . Septra [Sulfamethoxazole-Trimethoprim] Itching, Rash and Other (See Comments)    "Allergic," per MAR    Allergies as of 05/12/2020      Reactions   Sulfa Antibiotics Itching, Rash, Other (See Comments)   "Allergic," per MAR   Amoxicillin Other (See Comments)   Per [facility] 09/15/17 Has patient had a PCN reaction causing immediate rash, facial/tongue/throat swelling, SOB or lightheadedness with hypotension: Unknown Has patient had a PCN reaction causing severe rash involving mucus membranes or skin necrosis: Unknown Has patient had a PCN reaction that required hospitalization: Unknown Has patient had a PCN reaction occurring within the last 10 years: Unknown If all of the above answers are "NO", then may proceed with Cephalosporin use.   Avelox [moxifloxacin Hcl In Nacl] Itching   Doxycycline Other (See Comments)   "Allergic," per MAR   Hista-tabs [triprolidine-pse] Other (See Comments)   "Passed out"   Pyrilamine-phenylephrine Other (See Comments)   "Allergic," per [facility] 09/15/17   Septra [sulfamethoxazole-trimethoprim] Itching, Rash, Other (See Comments)   "Allergic," per [facility]      Medication List       Accurate as of May 12, 2020  2:29 PM. If you have any questions, ask your nurse or doctor.        acetaminophen 325 MG tablet Commonly known as: TYLENOL Take 650 mg by mouth every 6 (six) hours as needed (for  pain).   albuterol 108 (90 Base) MCG/ACT inhaler Commonly known as: VENTOLIN HFA Inhale 2 puffs into the lungs every 6 (six) hours as needed for shortness of breath (dyspnea).   benzonatate 100 MG capsule Commonly known as: TESSALON Take by mouth 3 (three) times daily as needed for cough.   chlorhexidine 0.12 % solution Commonly known as: PERIDEX Use as directed 15 mLs in the mouth or throat daily.   escitalopram 20 MG tablet Commonly known as: LEXAPRO Take 20 mg by mouth daily. In the morning 7am-11am.   famotidine 20 MG tablet Commonly known as: PEPCID Take 20 mg by mouth daily. In the morning 7am-11am.   Fluticasone-Salmeterol 250-50 MCG/DOSE Aepb Commonly known as: ADVAIR Inhale 1 puff into the lungs 2 (two) times daily. Morning: 7am-11am Evening: 4pm-7pm.  Rinse mouth after use   lactose free nutrition Liqd Take 237 mLs by mouth in the morning, at noon, and at bedtime. Add 237 ml's to ice cream and consume two times a day. 10am & 4pm.   levothyroxine 25 MCG tablet Commonly known as: SYNTHROID Take 25 mcg by mouth daily before breakfast. 7:30am.   mirtazapine 15 MG tablet Commonly known as: REMERON Take 15 mg by mouth daily. 7:30pm-11pm.   morphine CONCENTRATE 10 mg / 0.5 ml concentrated solution Take 2.5 mg by mouth every 6 (six) hours as needed  for severe pain.   OXYGEN Inhale 2 L/min into the lungs as needed (to keep O2 SATS >90%).   polyethylene glycol 17 g packet Commonly known as: MIRALAX / GLYCOLAX Take 17 g by mouth daily. Mix with 6-8 ounces of fluid of choice. Once daily 7am-11:00am.   polyvinyl alcohol 1.4 % ophthalmic solution Commonly known as: LIQUIFILM TEARS Place 1 drop into both eyes 3 (three) times daily.   sodium chloride 0.65 % nasal spray Commonly known as: OCEAN Place 1 spray into the nose 2 (two) times daily as needed for congestion.   sodium fluoride 1.1 % Gel dental gel Commonly known as: FLUORISHIELD Place 1 application onto  teeth at bedtime. brush on teeth with toothbrush after PM MOUTHCARE . SPIT OUT EXCESS AND DO NOT RINSE   zinc oxide 20 % ointment Apply to buttocks as needed every shift after incontinent episodes       Review of Systems  Immunization History  Administered Date(s) Administered  . Influenza Whole 12/27/2011, 11/05/2012  . Influenza, High Dose Seasonal PF 11/19/2018  . Influenza-Unspecified 11/18/2013, 11/03/2014, 11/16/2015, 11/13/2016, 11/17/2017, 11/09/2019  . Moderna Sars-Covid-2 Vaccination 02/08/2019, 03/08/2019, 12/20/2019  . PPD Test 04/04/2008, 11/23/2014  . Pneumococcal Conjugate-13 09/30/2016  . Pneumococcal Polysaccharide-23 02/05/2008, 06/03/2008  . Td 02/05/2008, 06/03/2008   Pertinent  Health Maintenance Due  Topic Date Due  . INFLUENZA VACCINE  09/04/2020  . DEXA SCAN  Completed  . PNA vac Low Risk Adult  Completed   Fall Risk  10/09/2017 09/23/2016 08/27/2016 05/30/2015 05/16/2015  Falls in the past year? Yes Yes Yes Yes No  Comment - - Emmi Telephone Survey: data to providers prior to load - -  Number falls in past yr: 2 or more 1 1 1  -  Comment - - Emmi Telephone Survey Actual Response = 1 - -  Injury with Fall? Yes No No Yes -  Comment broke R wrist - - - -  Risk Factor Category  - - - - -  Risk for fall due to : - - - History of fall(s);Impaired balance/gait;Impaired mobility -  Follow up - - - Falls evaluation completed;Education provided -   Functional Status Survey:    Vitals:   05/12/20 1404  BP: (!) 143/67  Pulse: (!) 57  Resp: 20  Temp: 97.6 F (36.4 C)  SpO2: 94%  Weight: 116 lb 4.8 oz (52.8 kg)  Height: 4\' 8"  (1.422 m)   Body mass index is 26.07 kg/m. Physical Exam  Labs reviewed: Recent Labs    06/17/19 0000 09/28/19 0000  NA 143 143  K 4.0 4.2  CL 110* 110*  CO2 26* 22  BUN 27* 12  CREATININE 1.0 0.9  CALCIUM 9.3 8.8   Recent Labs    06/17/19 0000 09/28/19 0000  AST 14 16  ALT 11 13  ALKPHOS 45 54  ALBUMIN 3.6 3.2*    Recent Labs    06/17/19 0000 09/28/19 0000  WBC 5.4 6.9  NEUTROABS 2,117 3,064  HGB 12.9 12.9  HCT 39 39  PLT 224 195   Lab Results  Component Value Date   TSH 1.03 09/28/2019   Lab Results  Component Value Date   HGBA1C 6.0 06/17/2019   Lab Results  Component Value Date   CHOL 211 (A) 02/14/2014   HDL 47 02/14/2014   LDLCALC 133 02/14/2014   TRIG 155 02/14/2014    Significant Diagnostic Results in last 30 days:  No results found.  Assessment/Plan There are no  diagnoses linked to this encounter.   Family/ staff Communication:   Labs/tests ordered:

## 2020-06-29 ENCOUNTER — Encounter: Payer: Self-pay | Admitting: Internal Medicine

## 2020-06-29 ENCOUNTER — Non-Acute Institutional Stay (SKILLED_NURSING_FACILITY): Admitting: Internal Medicine

## 2020-06-29 DIAGNOSIS — J410 Simple chronic bronchitis: Secondary | ICD-10-CM

## 2020-06-29 DIAGNOSIS — K219 Gastro-esophageal reflux disease without esophagitis: Secondary | ICD-10-CM

## 2020-06-29 DIAGNOSIS — F339 Major depressive disorder, recurrent, unspecified: Secondary | ICD-10-CM | POA: Diagnosis not present

## 2020-06-29 DIAGNOSIS — R4189 Other symptoms and signs involving cognitive functions and awareness: Secondary | ICD-10-CM

## 2020-06-29 DIAGNOSIS — R627 Adult failure to thrive: Secondary | ICD-10-CM | POA: Diagnosis not present

## 2020-06-29 DIAGNOSIS — E039 Hypothyroidism, unspecified: Secondary | ICD-10-CM

## 2020-06-29 DIAGNOSIS — R001 Bradycardia, unspecified: Secondary | ICD-10-CM

## 2020-06-29 NOTE — Progress Notes (Signed)
Location:    [place] Room Number: 25 Place of Service:  SNF (31) Provider:  Veleta Miners MD  Mast, Man X, NP  Patient Care Team: Mast, Man X, NP as PCP - General (Internal Medicine) Newton Pigg, MD as Consulting Physician (Obstetrics and Gynecology) Monna Fam, MD as Consulting Physician (Ophthalmology) Melida Quitter, MD as Consulting Physician (Otolaryngology) Myrlene Broker, MD as Consulting Physician (Urology) Strawberry, [facility] Virgie Dad, MD as Consulting Physician (Internal Medicine)  Extended Emergency Contact Information Primary Emergency Contact: Cone,[name] Address: [place], [place] Phone: [phone] Work Phone: [phone] Mobile Phone: [phone] Relation: Son Secondary Emergency Contact: Teofilo [place] of [place] Phone: [phone] Work Phone: [phone] Relation: Friend  Code Status:  DNR Hospice Goals of care: Advanced Directive information Advanced Directives 06/29/2020  Does Patient Have a Medical Advance Directive? Yes  Type of Paramedic of [place];Living will  Does patient want to make changes to medical advance directive? No - Patient declined  Copy of [place] in Chart? Yes - validated most recent copy scanned in chart (See row information)  Pre-existing out of facility DNR order (yellow form or pink MOST form) -     Chief Complaint  Patient presents with  . Medical Management of Chronic Issues  . Health Maintenance    TDAP    HPI:  Pt is a 87 y.o. female seen today for medical management of chronic diseases.    Patient is a long-term care resident Patient has h/o Sinus Bradycardia, LE edema, Hypothyroidism, Depression, Chronic Sinusitis, H/o Hip Fracture,And Proximal Humerus Fracture due to Fall   Enrolled in Hospice Has lost 3-4 lbs. Is on Remeron. Has Chronic  Cough but no Fever Family does not want anything Aggressive   Past Medical History:  Diagnosis Date  . Asthma   . Carpal tunnel syndrome 03/07/2009  . Disturbance of skin sensation 02/21/2009  . Diverticulosis of colon (without mention of hemorrhage) 02/16/2009  . Fracture of upper end of left humerus 11/18/14  . 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 Hypothyroid   . Osteoarthrosis, unspecified whether generalized or localized, unspecified site 02/16/2009  . Other abnormal blood chemistry 06/19/2010   hyperglycemia  . Other and unspecified hyperlipidemia 10/24/2009  . Other atopic dermatitis and related conditions 02/16/2009  . Otosclerosis, unspecified 02/22/2003  . Pain in limb 12/05/2009  . Senile osteoporosis 06/16/2010  . Sensorineural hearing loss, unspecified 02/21/2009  . Syncope and collapse 05/30/2009  . Unspecified nasal polyp 02/21/2009  . Unspecified urinary incontinence 02/21/2009   Past Surgical History:  Procedure Laterality Date  . ABDOMINAL HYSTERECTOMY  1999   Dr. Collier Bullock  . BREAST BIOPSY  07/16/1990   benign left breast needle biopsy  Dr. Margot Chimes  . CARPAL TUNNEL RELEASE  04/07/2009   right Dr. Daylene Katayama  . CATARACT EXTRACTION W/ INTRAOCULAR LENS  IMPLANT, BILATERAL Bilateral   . INTRAMEDULLARY (IM) NAIL INTERTROCHANTERIC Right 09/17/2017   Procedure: INTRAMEDULLARY (IM) NAIL INTERTROCHANTRIC HIP;  Surgeon: Nicholes Stairs, MD;  Location: [facility];  Service: Orthopedics;  Laterality: Right;  . OPEN REDUCTION INTERNAL FIXATION (ORIF) DISTAL RADIAL FRACTURE Right 09/17/2017   Procedure: OPEN REDUCTION INTERNAL FIXATION (ORIF) DISTAL RADIAL FRACTURE;  Surgeon: Iran Planas, MD;  Location: [facility];  Service: Orthopedics;  Laterality: Right;  . TRIGGER FINGER RELEASE Left 08/09/2014   Procedure: RELEASE  TRIGGER FINGER/A-1 PULLEY LEFT MIDDLE FINGER;  Surgeon: Daryll Brod, MD;  Location: [place];  Service: Orthopedics;  Laterality: Left;  REGIONAL/FAB    Allergies   Allergen Reactions  . Sulfa Antibiotics Itching, Rash and Other (See Comments)    "Allergic," per MAR  . Amoxicillin Other (See Comments)    Per [facility] 09/15/17 Has patient had a PCN reaction causing immediate rash, facial/tongue/throat swelling, SOB or lightheadedness with hypotension: Unknown Has patient had a PCN reaction causing severe rash involving mucus membranes or skin necrosis: Unknown Has patient had a PCN reaction that required hospitalization: Unknown Has patient had a PCN reaction occurring within the last 10 years: Unknown If all of the above answers are "NO", then may proceed with Cephalosporin use.  . Avelox [Moxifloxacin Hcl In Nacl] Itching  . Doxycycline Other (See Comments)    "Allergic," per MAR  . Hista-Tabs [Triprolidine-Pse] Other (See Comments)    "Passed out"  . Pyrilamine-Phenylephrine Other (See Comments)    "Allergic," per [facility] 09/15/17  . Septra [Sulfamethoxazole-Trimethoprim] Itching, Rash and Other (See Comments)    "Allergic," per MAR    Allergies as of 06/29/2020      Reactions   Sulfa Antibiotics Itching, Rash, Other (See Comments)   "Allergic," per MAR   Amoxicillin Other (See Comments)   Per [facility] 09/15/17 Has patient had a PCN reaction causing immediate rash, facial/tongue/throat swelling, SOB or lightheadedness with hypotension: Unknown Has patient had a PCN reaction causing severe rash involving mucus membranes or skin necrosis: Unknown Has patient had a PCN reaction that required hospitalization: Unknown Has patient had a PCN reaction occurring within the last 10 years: Unknown If all of the above answers are "NO", then may proceed with Cephalosporin use.   Avelox [moxifloxacin Hcl In Nacl] Itching   Doxycycline Other (See Comments)   "Allergic," per MAR   Hista-tabs [triprolidine-pse] Other (See Comments)   "Passed out"   Pyrilamine-phenylephrine Other (See Comments)   "Allergic," per [facility] 09/15/17   Septra [sulfamethoxazole-trimethoprim]  Itching, Rash, Other (See Comments)   "Allergic," per [facility]      Medication List       Accurate as of Jun 29, 2020 10:28 AM. If you have any questions, ask your nurse or doctor.        acetaminophen 325 MG tablet Commonly known as: TYLENOL Take 650 mg by mouth every 6 (six) hours as needed (for pain).   albuterol 108 (90 Base) MCG/ACT inhaler Commonly known as: VENTOLIN HFA Inhale 2 puffs into the lungs every 6 (six) hours as needed for shortness of breath (dyspnea).   benzonatate 100 MG capsule Commonly known as: TESSALON Take by mouth 3 (three) times daily as needed for cough.   chlorhexidine 0.12 % solution Commonly known as: PERIDEX Use as directed 15 mLs in the mouth or throat daily.   escitalopram 20 MG tablet Commonly known as: LEXAPRO Take 20 mg by mouth daily. In the morning 7am-11am.   famotidine 20 MG tablet Commonly known as: PEPCID Take 20 mg by mouth daily. In the morning 7am-11am.   Fluticasone-Salmeterol 250-50 MCG/DOSE Aepb Commonly known as: ADVAIR Inhale 1 puff into the lungs 2 (two) times daily. Morning: 7am-11am Evening: 4pm-7pm.  Rinse mouth after use   lactose free nutrition Liqd Take 237 mLs by mouth in the morning, at noon, and at bedtime. Add 237 ml's to ice cream and consume two times a day. 10am & 4pm.   levothyroxine 25 MCG tablet Commonly known as:  SYNTHROID Take 25 mcg by mouth daily before breakfast. 7:30am.   mirtazapine 15 MG tablet Commonly known as: REMERON Take 15 mg by mouth daily. 7:30pm-11pm.   morphine CONCENTRATE 10 mg / 0.5 ml concentrated solution Take 2.5 mg by mouth every 6 (six) hours as needed for severe pain.   OXYGEN Inhale 2 L/min into the lungs as needed (to keep O2 SATS >90%).   polyethylene glycol 17 g packet Commonly known as: MIRALAX / GLYCOLAX Take 17 g by mouth daily. Mix with 6-8 ounces of fluid of choice. Once daily 7am-11:00am.   polyvinyl alcohol 1.4 % ophthalmic solution Commonly known as:  LIQUIFILM TEARS Place 1 drop into both eyes 3 (three) times daily.   sodium chloride 0.65 % nasal spray Commonly known as: OCEAN Place 1 spray into the nose 2 (two) times daily as needed for congestion.   sodium fluoride 1.1 % Gel dental gel Commonly known as: FLUORISHIELD Place 1 application onto teeth at bedtime. brush on teeth with toothbrush after PM MOUTHCARE . SPIT OUT EXCESS AND DO NOT RINSE   zinc oxide 20 % ointment Apply to buttocks as needed every shift after incontinent episodes       Review of Systems  Constitutional: Positive for activity change, appetite change and unexpected weight change.  HENT: Positive for congestion and rhinorrhea.   Respiratory: Positive for cough and shortness of breath.   Cardiovascular: Negative.   Gastrointestinal: Negative.   Genitourinary: Negative.   Musculoskeletal: Positive for gait problem.  Skin: Negative.   Neurological: Positive for weakness.  Psychiatric/Behavioral: Positive for confusion.    Immunization History  Administered Date(s) Administered  . Influenza Whole 12/27/2011, 11/05/2012  . Influenza, High Dose Seasonal PF 11/19/2018  . Influenza-Unspecified 11/18/2013, 11/03/2014, 11/16/2015, 11/13/2016, 11/17/2017, 11/09/2019  . Moderna Sars-Covid-2 Vaccination 02/08/2019, 03/08/2019, 12/20/2019  . PPD Test 04/04/2008, 11/23/2014  . Pneumococcal Conjugate-13 09/30/2016  . Pneumococcal Polysaccharide-23 02/05/2008, 06/03/2008  . Td 02/05/2008, 06/03/2008   Pertinent  Health Maintenance Due  Topic Date Due  . INFLUENZA VACCINE  09/04/2020  . DEXA SCAN  Completed  . PNA vac Low Risk Adult  Completed   Fall Risk  10/09/2017 09/23/2016 08/27/2016 05/30/2015 05/16/2015  Falls in the past year? Yes Yes Yes Yes No  Comment - - Emmi Telephone Survey: data to providers prior to load - -  Number falls in past yr: 2 or more 1 1 1  -  Comment - - Emmi Telephone Survey Actual Response = 1 - -  Injury with Fall? Yes No No Yes -   Comment broke R wrist - - - -  Risk Factor Category  - - - - -  Risk for fall due to : - - - History of fall(s);Impaired balance/gait;Impaired mobility -  Follow up - - - Falls evaluation completed;Education provided -   Functional Status Survey:    Vitals:   06/29/20 1005  BP: (!) 149/76  Pulse: (!) 56  Resp: 20  Temp: 97.6 F (36.4 C)  SpO2: 92%  Weight: 118 lb 1.6 oz (53.6 kg)  Height: 4\' 8"  (1.422 m)   Body mass index is 26.48 kg/m. Physical Exam Vitals reviewed.  Constitutional:      Appearance: Normal appearance.  HENT:     Head: Normocephalic.     Nose: Nose normal.     Mouth/Throat:     Mouth: Mucous membranes are moist.     Pharynx: Oropharynx is clear.  Eyes:     Pupils: Pupils are equal,  round, and reactive to light.  Cardiovascular:     Rate and Rhythm: Normal rate and regular rhythm.     Pulses: Normal pulses.  Pulmonary:     Effort: Pulmonary effort is normal.     Breath sounds: Normal breath sounds. No wheezing.  Abdominal:     General: Abdomen is flat. Bowel sounds are normal.     Palpations: Abdomen is soft.  Musculoskeletal:        General: No swelling.     Cervical back: Neck supple.  Skin:    General: Skin is warm.  Neurological:     General: No focal deficit present.     Mental Status: She is alert.  Psychiatric:        Mood and Affect: Mood normal.        Thought Content: Thought content normal.     Labs reviewed: Recent Labs    09/28/19 0000  NA 143  K 4.2  CL 110*  CO2 22  BUN 12  CREATININE 0.9  CALCIUM 8.8   Recent Labs    09/28/19 0000  AST 16  ALT 13  ALKPHOS 54  ALBUMIN 3.2*   Recent Labs    09/28/19 0000  WBC 6.9  NEUTROABS 3,064  HGB 12.9  HCT 39  PLT 195   Lab Results  Component Value Date   TSH 1.03 09/28/2019   Lab Results  Component Value Date   HGBA1C 6.0 06/17/2019   Lab Results  Component Value Date   CHOL 211 (A) 02/14/2014   HDL 47 02/14/2014   LDLCALC 133 02/14/2014   TRIG 155  02/14/2014    Significant Diagnostic Results in last 30 days:  No results found.  Assessment/Plan Simple chronic bronchitis (HCC) On Advair and Albuterol Continue to be stable Adult failure to thrive Continues to loose weight On Remeron Enrolled in Hospice No aggressive work up Depression, recurrent ([place]) Continue Lexapro and Remeron Cognitive impairment Supportive care depedent for her ADLS Gastroesophageal reflux disease without esophagitis On Pepcid Acquired hypothyroidism TSH normal in 8/21 Sinus bradycardia Stable    Family/ staff Communication:   Labs/tests ordered:  No Blood Work per [employer]

## 2020-07-05 DEATH — deceased
